# Patient Record
Sex: Female | Born: 1975 | Hispanic: Yes | Marital: Single | Smoking: Current some day smoker
Health system: Southern US, Community
[De-identification: ages and names within clinical notes are randomized; demographics above are authoritative.]

## PROBLEM LIST (undated history)

## (undated) DIAGNOSIS — F209 Schizophrenia, unspecified: Secondary | ICD-10-CM

## (undated) DIAGNOSIS — I10 Essential (primary) hypertension: Secondary | ICD-10-CM

## (undated) DIAGNOSIS — F319 Bipolar disorder, unspecified: Secondary | ICD-10-CM

## (undated) DIAGNOSIS — G43909 Migraine, unspecified, not intractable, without status migrainosus: Secondary | ICD-10-CM

## (undated) HISTORY — PX: OTHER SURGICAL HISTORY: SHX169

---

## 2016-01-23 ENCOUNTER — Emergency Department (HOSPITAL_COMMUNITY)
Admission: EM | Admit: 2016-01-23 | Discharge: 2016-01-24 | Disposition: A | Discharge status: Home / Self Care | Payer: Medicaid Other | Attending: Emergency Medicine | Admitting: Emergency Medicine

## 2016-01-23 ENCOUNTER — Encounter (HOSPITAL_COMMUNITY): Payer: Self-pay | Admitting: Oncology

## 2016-01-23 DIAGNOSIS — F209 Schizophrenia, unspecified: Secondary | ICD-10-CM | POA: Insufficient documentation

## 2016-01-23 DIAGNOSIS — Z79899 Other long term (current) drug therapy: Secondary | ICD-10-CM | POA: Insufficient documentation

## 2016-01-23 DIAGNOSIS — F202 Catatonic schizophrenia: Principal | ICD-10-CM | POA: Diagnosis present

## 2016-01-23 LAB — COMPREHENSIVE METABOLIC PANEL
ALBUMIN: 4.3 g/dL (ref 3.5–5.0)
ALK PHOS: 71 U/L (ref 38–126)
ALT: 12 U/L — AB (ref 14–54)
AST: 18 U/L (ref 15–41)
Anion gap: 10 (ref 5–15)
BUN: 17 mg/dL (ref 6–20)
CALCIUM: 9.8 mg/dL (ref 8.9–10.3)
CHLORIDE: 102 mmol/L (ref 101–111)
CO2: 24 mmol/L (ref 22–32)
CREATININE: 0.68 mg/dL (ref 0.44–1.00)
GFR calc non Af Amer: >60 mL/min (ref >60)
GLUCOSE: 107 mg/dL — AB (ref 65–99)
Potassium: 3.7 mmol/L (ref 3.5–5.1)
SODIUM: 136 mmol/L (ref 135–145)
Total Bilirubin: 0.6 mg/dL (ref 0.3–1.2)
Total Protein: 8.1 g/dL (ref 6.5–8.1)

## 2016-01-23 LAB — CBC
HEMATOCRIT: 41.7 % (ref 36.0–46.0)
HEMOGLOBIN: 14.7 g/dL (ref 12.0–15.0)
MCH: 32.2 pg (ref 26.0–34.0)
MCHC: 35.3 g/dL (ref 30.0–36.0)
MCV: 91.4 fL (ref 78.0–100.0)
Platelets: 386 10*3/uL (ref 150–400)
RBC: 4.56 MIL/uL (ref 3.87–5.11)
RDW: 13.2 % (ref 11.5–15.5)
WBC: 11.8 10*3/uL — ABNORMAL HIGH (ref 4.0–10.5)

## 2016-01-23 LAB — SALICYLATE LEVEL: Salicylate Lvl: <4 mg/dL (ref 2.8–30.0)

## 2016-01-23 LAB — ETHANOL: Alcohol, Ethyl (B): <5 mg/dL (ref <5)

## 2016-01-23 LAB — ACETAMINOPHEN LEVEL: Acetaminophen (Tylenol), Serum: <10 ug/mL — ABNORMAL LOW (ref 10–30)

## 2016-01-23 NOTE — ED Provider Notes (Signed)
[facility] DEPT Provider Note   CSN: [id number] Arrival date & time: 01/23/16  2218  By signing my name below, I, Isabella [name], attest that this documentation has been prepared under the direction and in the presence of [employer], PA-C.  Electronically Signed: Reola [name], ED Scribe. 01/24/16. 12:05 AM.  History   Chief Complaint Chief Complaint  Patient presents with  . Medical Clearance   The history is provided by the patient and a parent. A language interpreter was used ([place]).   HPI Comments: Isabella [name] is a 40 y.o. female who presents to the Emergency Department for medical clearance. Pt was sent from [facility] to have her medically cleared prior to admission. Per mother, over the past two weeks the pt has not been drinking fluids or eating foods leading to her to bring her into [facility] for evaluation. She additionally notes that the pt has been sleeping more throughout the day. Pt has a hx of similar episodes in the past. Pt denies any pain, SI/HI, or hallucinations of any type.    History reviewed. No pertinent past medical history.  There are no active problems to display for this patient.  History reviewed. No pertinent surgical history.  OB History    No data available     Home Medications    Prior to Admission medications   Medication Sig Start Date End Date Taking? Authorizing Provider  benztropine (COGENTIN) 0.5 MG tablet Take 0.5-1 mg by mouth 2 (two) times daily. 0.5 mg in the morning & 1 mg at bedtime   Yes Historical Provider, MD  haloperidol (HALDOL) 5 MG tablet Take 5 mg by mouth 2 (two) times daily.    Yes Historical Provider, MD  lisinopril (PRINIVIL,ZESTRIL) 5 MG tablet Take 5 mg by mouth daily.   Yes Historical Provider, MD   Family History No family history on file.  Social History Social History  Substance Use Topics  . Smoking status: Never Smoker  . Smokeless tobacco: Never Used  . Alcohol use No   Allergies   Review of  patient's allergies indicates no known allergies.  Review of Systems Review of Systems  Psychiatric/Behavioral: Positive for behavioral problems. Negative for hallucinations and suicidal ideas.  A complete 10 system review of systems was obtained and all systems are negative except as noted in the HPI and PMH.    Physical Exam Updated Vital Signs BP 98/79 (BP Location: Left Arm)   Pulse 88   Temp 97.5 F (36.4 C) (Oral)   Resp 18   LMP 01/03/2016 (Approximate)   SpO2 98%   Physical Exam  Constitutional: She is oriented to person, place, and time. She appears well-developed and well-nourished. No distress.  HENT:  Head: Normocephalic and atraumatic.  Eyes: Conjunctivae and EOM are normal. No scleral icterus.  Neck: Normal range of motion.  Pulmonary/Chest: Effort normal. No respiratory distress.  Musculoskeletal: Normal range of motion.  Neurological: She is alert and oriented to person, place, and time.  Skin: Skin is warm and dry. No rash noted. She is not diaphoretic. No erythema. No pallor.  Psychiatric: She is slowed and withdrawn. She exhibits a depressed mood. She expresses no homicidal and no suicidal ideation.  Flat affect  Nursing note and vitals reviewed.   ED Treatments / Results  DIAGNOSTIC STUDIES: Oxygen Saturation is 100% on RA, normal by my interpretation.   COORDINATION OF CARE: 12:05 AM-Discussed next steps with pt. Pt verbalized understanding and is agreeable with the plan.  Labs (all labs ordered are listed, but only abnormal results are displayed) Labs Reviewed  COMPREHENSIVE METABOLIC PANEL - Abnormal; Notable for the following:       Result Value   Glucose, Bld 107 (*)    ALT 12 (*)    All other components within normal limits  ACETAMINOPHEN LEVEL - Abnormal; Notable for the following:    Acetaminophen (Tylenol), Serum <10 (*)    All other components within normal limits  CBC - Abnormal; Notable for the following:    WBC 11.8 (*)    All  other components within normal limits  ETHANOL  SALICYLATE LEVEL  URINE RAPID DRUG SCREEN, HOSP PERFORMED  POC URINE PREG, ED    EKG  EKG Interpretation None      Radiology No results found.  Procedures Procedures (including critical care time)  Medications Ordered in ED Medications - No data to display  Initial Impression / Assessment and Plan / ED Course  I have reviewed the triage vital signs and the nursing notes.  Pertinent labs & imaging results that were available during my care of the patient were reviewed by me and considered in my medical decision making (see chart for details).  Clinical Course    Patient medically cleared. Accepted for care at [facility]. Verbal report of negative urine pregnancy from Mini lab. Patient transferred in stable condition.   Final Clinical Impressions(s) / ED Diagnoses   Final diagnoses:  Schizophrenia, unspecified type [facility])    New Prescriptions Discharge Medication List as of 01/24/2016  1:39 AM      I personally performed the services described in this documentation, which was scribed in my presence. The recorded information has been reviewed and is accurate.       Antonietta Breach, PA-C 01/24/16 0214    Orpah Greek, MD 01/24/16 [phone]

## 2016-01-23 NOTE — BH Assessment (Addendum)
Tele Assessment Note    Isabella [name] is an 40 y.o. female presenting voluntarily for assessment. Pt is accompanied by mother. [employer] video interpreter was utilized during interview to obtain collateral information from mother. Pt was referred to [facility] by family members due to concerns regarding increased vegetative symptoms.   Per intake paperwork completed by family members pt is  "unable to get out of bed, daily function is n ot meeting any normal expectations even after the medications. Not eating, low energy, always sleeping, issues with showering".  Onset of concerning symptoms reported to be 13 days ago. Mom and Pt report no physical limitations in regards to performing activities of daily living with the exception of jaw pain and weakness in hands. Mom states pt experiences jaw pain which limits her ability to open her mouth for eating.  Pt was observed by clinician utilizing hands during assessment to drink a beverage.   Pt is followed by [facility] for medication management only. Mom reports pt compliance with medication however, is concerned with medication effectiveness. [facility] has been made aware of medication concerns and recently updated pt's medications. Mom reports continued concern with medication ineffectiveness.  Pt denies self-injurious behaviors, suicidal ideation, homicidal ideation and hallucinations.  Pt denies history of suicide attempt. Pt has history of schizophrenia and depression. Mom reports pt history of one eight month inpatient admission at [facility] for depression. Mom was unable to recall dates of stay.   Diagnosis: F20.9 Schizophrenia  Past Medical History: No past medical history on file.  No past surgical history on file.  Family History: No family history on file.  Social History:  has no tobacco, alcohol, and drug history on file.  Additional Social History:  Alcohol / Drug Use Pain Medications: No abuse reported. Prescriptions: No abuse reported. Mom  reports compliance with all medications. Over the Counter: No abuse reported. History of alcohol / drug use?: No history of alcohol / drug abuse  CIWA:   COWS:    PATIENT STRENGTHS: (choose at least two) Average or above average intelligence Supportive family/friends  Allergies: Allergies not on file  Home Medications:  (Not in a hospital admission)  OB/GYN Status:  No LMP recorded.  General Assessment Data Location of Assessment: [facility] Assessment Services TTS Assessment: In system Is this a Tele or Face-to-Face Assessment?: Face-to-Face Is this an Initial Assessment or a Re-assessment for this encounter?: Initial Assessment Marital status:  (Not Reported) Is patient pregnant?: Unknown Pregnancy Status: Unknown Living Arrangements: Parent (mother and father) Can pt return to current living arrangement?: Yes Admission Status: Voluntary Is patient capable of signing voluntary admission?: Yes Referral Source: Self/Family/Friend Insurance type: Self-Pay  Medical Screening Exam ([place]) Medical Exam completed: No Reason for MSE not completed: Other: (transferred to [facility] for med. clearance per PA recommendation)  Crisis Care Plan Living Arrangements: Parent (mother and father) Name of Psychiatrist: [facility] Name of Therapist: None  Education Status Is patient currently in school?: No Highest grade of school patient has completed: 11th  Risk to self with the past 6 months Suicidal Ideation: No Has patient been a risk to self within the past 6 months prior to admission? : No Suicidal Intent: No Has patient had any suicidal intent within the past 6 months prior to admission? : No Is patient at risk for suicide?: No Suicidal Plan?: No Has patient had any suicidal plan within the past 6 months prior to admission? : No Access to Means: No What has been your use of  drugs/alcohol within the last 12 months?: Pt denies use of drugs/alcohol Previous Attempts/Gestures:  No Other Self Harm Risks: Vegetative Symptoms Intentional Self Injurious Behavior: None Family Suicide History: No Recent stressful life event(s):  (None identified) Persecutory voices/beliefs?: No (Pt denies) Depression: Yes Depression Symptoms: Isolating, Loss of interest in usual pleasures Substance abuse history and/or treatment for substance abuse?: No Suicide prevention information given to non-admitted patients: Not applicable  Risk to Others within the past 6 months Homicidal Ideation: No Does patient have any lifetime risk of violence toward others beyond the six months prior to admission? : No Thoughts of Harm to Others: No Current Homicidal Intent: No Current Homicidal Plan: No Access to Homicidal Means: No History of harm to others?: No Assessment of Violence: None Noted Does patient have access to weapons?: No Criminal Charges Pending?: No Does patient have a court date: No Is patient on probation?: No  Psychosis Hallucinations: None noted (Pt denies) Delusions: None noted  Mental Status Report Appearance/Hygiene: Unremarkable Eye Contact: Fair Motor Activity: Psychomotor retardation Speech: Slow, Logical/coherent Level of Consciousness: Alert, Quiet/awake Mood: Anhedonia Affect: Flat Anxiety Level: None Thought Processes: Relevant, Coherent Judgement: Partial Orientation: Person, Time, Place, Situation Obsessive Compulsive Thoughts/Behaviors: Unable to Assess  Cognitive Functioning Concentration: Decreased Memory: Recent Intact, Remote Intact IQ: Average Insight: Poor Impulse Control: Fair Appetite: Poor Weight Loss: 20 (within 84mths) Weight Gain: 0 Sleep: Increased Total Hours of Sleep: 17 Vegetative Symptoms: Staying in bed, Not bathing, Decreased grooming  ADLScreening [facility] Assessment Services) Patient's cognitive ability adequate to safely complete daily activities?: No Patient able to express need for assistance with ADLs?:  Yes Independently performs ADLs?: Yes (appropriate for developmental age) (Pt is physically able to perform ADLs without assistance)  Prior Inpatient Therapy Prior Inpatient Therapy: Yes Prior Therapy Dates: Mom reports 81mth stay, dates of stay unknown. Mom reports stay was " a few months ago" Prior Therapy Facilty/Provider(s): [facility] Reason for Treatment: depression per mom  Prior Outpatient Therapy Prior Outpatient Therapy: Yes Prior Therapy Dates: ongoing Prior Therapy Facilty/Provider(s): [facility] Reason for Treatment: Medication Management only Does patient have an ACCT team?: No Does patient have Intensive In-House Services?  : No Does patient have [facility] services? : No Does patient have P4CC services?: No  ADL Screening (condition at time of admission) Patient's cognitive ability adequate to safely complete daily activities?: No Is the patient deaf or have difficulty hearing?: No Does the patient have difficulty seeing, even when wearing glasses/contacts?: No Does the patient have difficulty concentrating, remembering, or making decisions?: Yes (Per mother, pt denies) Patient able to express need for assistance with ADLs?: Yes Does the patient have difficulty dressing or bathing?: No Independently performs ADLs?: Yes (appropriate for developmental age) (Pt is physically able to perform ADLs without assistance) Does the patient have difficulty walking or climbing stairs?: No Weakness of Legs: None Weakness of Arms/Hands: Both  Home Assistive Devices/Equipment Home Assistive Devices/Equipment: None  Therapy Consults (therapy consults require a physician order) PT Evaluation Needed: No OT Evalulation Needed: No SLP Evaluation Needed: No Abuse/Neglect Assessment (Assessment to be complete while patient is alone) Physical Abuse: Denies Verbal Abuse: Denies Sexual Abuse: Denies Exploitation of patient/patient's resources: Denies Self-Neglect: Denies Values /  Beliefs Cultural Requests During Hospitalization: None Spiritual Requests During Hospitalization: None Consults Spiritual Care Consult Needed: No Social Work Consult Needed: No Regulatory affairs officer (For Healthcare) Does patient have an advance directive?: No Would patient like information on creating an advanced directive?: No - patient declined information  Additional Information 1:1 In Past 12 Months?: No CIRT Risk: No Elopement Risk: No Does patient have medical clearance?: No     Disposition: Clinician consulted with Darlyne Russian, PA and pt is recommended for inpatient admission. Pt has been assigned to 507 bed 1 by Shana Chute. Pt is being transferred to [facility] for medical clearance per PA's directives. Lattie Haw, Agricultural consultant has been notified of pt disposition.  Disposition Initial Assessment Completed for this Encounter: Yes Disposition of Patient: Inpatient treatment program Type of inpatient treatment program: Adult  Juanpablo Ciresi J Martin[place] 01/23/2016 10:00 PM

## 2016-01-23 NOTE — H&P (Signed)
Behavioral Health Medical Screening Exam  Isabella [name] is an 40 y.o. female.  Total Time spent with patient: 15 minutes  Psychiatric Specialty Exam: Physical Exam  Constitutional:  Reported wgt loss 20 lbs last 4 months per mother  HENT:  Head: Normocephalic and atraumatic.  Eyes: Conjunctivae and EOM are normal. Pupils are equal, round, and reactive to light. Right eye exhibits no discharge. Left eye exhibits no discharge. No scleral icterus.  Neck: No JVD present. No tracheal deviation present. No thyromegaly present.  Cardiovascular: Normal rate and regular rhythm.   Respiratory: No stridor. No respiratory distress. She has no wheezes.  GI:  deferred  Genitourinary:  Genitourinary Comments: deferred  Musculoskeletal:  Catatonic like posture/marked psychomotor retardation  Lymphadenopathy:    She has no cervical adenopathy.  Neurological: She is alert. No cranial nerve deficit. She exhibits abnormal muscle tone.  Skin: Skin is warm and dry.  Psychiatric:  Behavior-marked psychomotor retardation.Unable to attend to ADL per mother Orientation x 3 Affect-very flat Perception-lacks insight Thought- delusions;denies HI SI    Review of Systems  Constitutional: Positive for malaise/fatigue and weight loss. Negative for chills, diaphoresis and fever.  HENT: Negative for congestion, ear discharge, ear pain, hearing loss, nosebleeds, sore throat and tinnitus.   Eyes: Negative for blurred vision, double vision, photophobia, pain, discharge and redness.  Respiratory: Negative for cough, hemoptysis, sputum production, shortness of breath, wheezing and stridor.   Cardiovascular: Negative for chest pain, palpitations, orthopnea, claudication, leg swelling and PND.  Gastrointestinal: Negative for abdominal pain, blood in stool, constipation, diarrhea, heartburn, melena, nausea and vomiting.  Genitourinary: Negative for dysuria, flank pain, frequency, hematuria and urgency.   Musculoskeletal: Negative for back pain, falls, joint pain, myalgias and neck pain.  Skin: Negative for itching and rash.  Neurological: Negative for dizziness, tingling, tremors, sensory change, speech change, focal weakness, seizures, loss of consciousness, weakness and headaches.  Endo/Heme/Allergies: Negative for environmental allergies and polydipsia. Does not bruise/bleed easily.  Psychiatric/Behavioral: Positive for depression. Negative for hallucinations, substance abuse and suicidal ideas. The patient is nervous/anxious and has insomnia.        Motgher reports pt SPENT * mos as inpt at [facility] this year.UNABLE TO ACCESS THRU CARE EVERYWHERE USING OUR DEMOGRAPHICS    There were no vitals taken for this visit.There is no height or weight on file to calculate BMI.  General Appearance: Fairly Groomed Mask like facies  Eye Contact:  Good  Speech:  Slow  Volume:  Decreased  Mood:  Anhedonic  Affect:  Congruent  Thought Process:  Disorganized and Descriptions of Associations: Loose  Orientation:  Full (Time, Place, and Person)  Thought Content:  Delusions and Rumination  Suicidal Thoughts:  No  Homicidal Thoughts:  No  Memory:  Negative  Judgement:  Impaired  Insight:  Lacking  Psychomotor Activity:  Psychomotor Retardation  Concentration: Concentration: limited and Attention Span: Limited  Recall:  [place] of Knowledge:Fair  Language: Fair  Akathisia:  Negative  Handed:  Right  AIMS (if indicated):     Assets:  Social Support  Sleep:   Disturbed    Musculoskeletal: Strength & Muscle Tone: Catatonic like/cannot rule out reaction to Haldol Gait & Station: Slow/retarded Patient leans: N/A  There were no vitals taken for this visit.  Recommendations:  Based on my evaluation the patient appears to have an emergency medical condition for which I recommend the patient be transferred to the emergency department for further evaluation.  Darlyne Russian, PA-C 01/23/2016, 10:06  PM

## 2016-01-23 NOTE — ED Triage Notes (Signed)
Per Idelle Jo, [facility] called and stated pt was coming here for medical clearance.  When talking to the pt she cannot verbalized why she is here.  Pt will be speaking then stare off into space.  Pt denies SI/HI/AVH.

## 2016-01-23 NOTE — ED Notes (Signed)
Bed: [facility]4 Expected date:  Expected time:  Means of arrival:  Comments: 

## 2016-01-24 ENCOUNTER — Other Ambulatory Visit (HOSPITAL_COMMUNITY): Payer: Self-pay | Admitting: Medical

## 2016-01-24 ENCOUNTER — Inpatient Hospital Stay (HOSPITAL_COMMUNITY)
Admission: AD | Admit: 2016-01-24 | Discharge: 2016-02-08 | DRG: 885 | Disposition: A | Discharge status: Home / Self Care | Payer: Medicaid Other | Attending: Psychiatry | Admitting: Psychiatry

## 2016-01-24 ENCOUNTER — Encounter (HOSPITAL_COMMUNITY): Payer: Self-pay | Admitting: Medical

## 2016-01-24 DIAGNOSIS — R42 Dizziness and giddiness: Secondary | ICD-10-CM | POA: Diagnosis not present

## 2016-01-24 DIAGNOSIS — F202 Catatonic schizophrenia: Secondary | ICD-10-CM | POA: Diagnosis present

## 2016-01-24 LAB — RAPID URINE DRUG SCREEN, HOSP PERFORMED
AMPHETAMINES: NOT DETECTED
Barbiturates: NOT DETECTED
Benzodiazepines: NOT DETECTED
COCAINE: NOT DETECTED
OPIATES: NOT DETECTED
TETRAHYDROCANNABINOL: NOT DETECTED

## 2016-01-24 MED ORDER — ENSURE ENLIVE PO LIQD
237.0000 mL | Freq: Two times a day (BID) | ORAL | Status: DC
  Administered 2016-01-24 – 2016-02-08 (×22): 237 mL via ORAL

## 2016-01-24 MED ORDER — LISINOPRIL 5 MG PO TABS
5.0000 mg | ORAL_TABLET | Freq: Every day | ORAL | Status: DC
  Administered 2016-01-24 – 2016-01-25 (×2): 5 mg via ORAL
  Filled 2016-01-24 (×4): qty 1

## 2016-01-24 MED ORDER — MAGNESIUM HYDROXIDE 400 MG/5ML PO SUSP: 30.0000 mL | Freq: Every day | ORAL | Status: DC | PRN

## 2016-01-24 MED ORDER — BENZTROPINE MESYLATE 0.5 MG PO TABS
0.5000 mg | ORAL_TABLET | Freq: Every day | ORAL | Status: DC
  Administered 2016-01-24 – 2016-01-25 (×2): 0.5 mg via ORAL
  Filled 2016-01-24 (×3): qty 1

## 2016-01-24 MED ORDER — ALUM & MAG HYDROXIDE-SIMETH 200-200-20 MG/5ML PO SUSP: 30.0000 mL | ORAL | Status: DC | PRN

## 2016-01-24 MED ORDER — ACETAMINOPHEN 325 MG PO TABS
650.0000 mg | ORAL_TABLET | Freq: Four times a day (QID) | ORAL | Status: DC | PRN
  Administered 2016-01-27: 650 mg via ORAL
  Filled 2016-01-24: qty 2

## 2016-01-24 MED ORDER — HYDROXYZINE HCL 25 MG PO TABS
25.0000 mg | ORAL_TABLET | ORAL | Status: DC | PRN
  Administered 2016-01-28: 25 mg via ORAL
  Filled 2016-01-24: qty 1
  Filled 2016-01-24: qty 10

## 2016-01-24 MED ORDER — BENZTROPINE MESYLATE 1 MG PO TABS
1.0000 mg | ORAL_TABLET | Freq: Every day | ORAL | Status: DC
  Administered 2016-01-24 – 2016-01-25 (×2): 1 mg via ORAL
  Filled 2016-01-24 (×3): qty 1

## 2016-01-24 NOTE — Progress Notes (Signed)
Patient arrived to unit voluntarily after being brought in by her mother for evaluation. When asked by staff, patient unable to give a reason for admission, but instead only repeated the same phrase "My mother brought me here. She said I am sick". Pt unable to think of a reason why her mother may have thought this. Pt denies suicidal ideations, homicidal ideations, hallucinations, or depression. Pt notably disheveled with hair oily and unwashed and dirt under her fingernails. Pt with vacant gaze and delayed responses and thought blocking. Pt also with psychomotor retardation and is slow in movements and in her ability to follow directions. Pt unable to answer the majority of assessment questions. Pt did state "I'm ready to go to sleep now" and did not answer any further questions. No s/s of distress noted on assessment.

## 2016-01-24 NOTE — Progress Notes (Signed)
Isabella [name] has been in her bed..influenza her room most of the afternoon. She does not speak with this Probation officer. A She refused to complete her daily assessment and shook her head " no" when this Probation officer asked her to answer the questions and Probation officer would transcribe the answers fior her. A She is isolative and guarded. R Safety in place.

## 2016-01-24 NOTE — H&P (Signed)
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 female presenting voluntarily for assessment. Pt is accompanied by mother. [employer] video interpreter was utilized during interview to obtain collateral information from mother. Pt was referred to [facility] by family members due to concerns regarding increased vegetative symptoms. Per intake paperwork completed by family members pt is  "unable to get out of bed, daily function is n ot meeting any normal expectations even after the medications. Not eating, low energy, always sleeping, issues with showering".  Onset of concerning symptoms reported to be 13 days ago. Mom and Pt report no physical limitations in regards to performing activities of daily living with the exception of jaw pain and weakness in hands. Mom states pt experiences jaw pain which limits her ability to open her mouth for eating.  Pt was observed by clinician utilizing hands during assessment to drink a beverage.  Pt is followed by [facility] for medication management only. Mom reports pt compliance with medication however, is concerned with medication effectiveness. [facility] has been made aware of medication concerns and recently updated pt's medications. Mom reports continued concern with medication ineffectiveness. Pt denies self-injurious behaviors, suicidal ideation, homicidal ideation and hallucinations.  Pt denies history of suicide attempt. Pt has history of schizophrenia and depression. Mom reports pt history of one eight month inpatient admission at [facility] for depression. Mom was unable to recall dates of stay.    On evaluationReyna [name] is awake and alert appears to be catatonic. Seen resting in bedroom.  Denies suicidal or homicidal ideation. Denies auditory or visual hallucination. Patient appears to be thought blocking at times. Patient reports she is medications complaint and is unsure why she is in patient at this time. Patient denies depression or depressive symptoms.Patient reports resting well.  Support, encouragement and reassurance was provided.   Associated Signs/Symptoms: Depression Symptoms:  psychomotor retardation, loss of energy/fatigue, (Hypo) Manic Symptoms:  Distractibility, Anxiety Symptoms:  Excessive Worry, Psychotic Symptoms:  Hallucinations: None PTSD Symptoms: Avoidance:  None Total Time spent with patient: 30 minutes  Past Psychiatric History: see above  Is the patient at risk to self? No.  Has the patient been a risk to self in the past 6 months? No.  Has the patient been a risk to self within the distant past? No.  Is the patient a risk to others? No.  Has the patient been a risk to others in the past 6 months? No.  Has the patient been a risk to others within the distant past? No.   Prior Inpatient Therapy: Prior Inpatient Therapy: Yes Prior Therapy Dates: Mom reports [facility] stay, dates of stay unknown. Mom reports stay was " a few months ago" [facility] Prior Outpatient Therapy: Prior Outpatient Therapy: Yes Prior Therapy Dates: ongoing Prior Therapy Facilty/Provider(s): [facility] Reason for Treatment: Medication Management only Does patient have an ACCT team?: No Does patient have Intensive In-House Services?  : No Does patient have [facility] services? : No Does patient have P4CC services?: No  Alcohol Screening: 1. How often do you have a drink containing alcohol?: Never 9. Have you or someone else been injured as a result of  your drinking?: No 10. Has a relative or friend or a  doctor or another health worker been concerned about your drinking or suggested you cut down?: No Alcohol Use Disorder Identification Test Final Score (AUDIT): 0 Brief Intervention: AUDIT score less than 7 or less-screening does not suggest unhealthy drinking-brief intervention not indicated Substance Abuse History in the last 12 months:  No. Consequences of Substance Abuse: NA Previous Psychotropic Medications: YES Psychological Evaluations: YES Past Medical History: History reviewed. No pertinent past medical history. History reviewed. No pertinent surgical history. Family History: History reviewed. No pertinent family history. Family Psychiatric  History:  Tobacco Screening: Have you used any form of tobacco in the last 30 days? (Cigarettes, Smokeless Tobacco, Cigars, and/or Pipes): No Social History:  History  Alcohol Use No     History  Drug Use No    Additional Social History: Marital status:  (Not Reported)    Pain Medications: No abuse reported. Prescriptions: No abuse reported. Mom reports compliance with all medications. Over the Counter: No abuse reported. History of alcohol / drug use?: No history of alcohol / drug abuse                    Allergies:  No Known Allergies Lab Results:  Results for orders placed or performed during the hospital encounter of 01/23/16 (from the past 48 hour(s))  Rapid urine drug screen (hospital performed)     Status: None   Collection Time: 01/23/16 12:37 AM  Result Value Ref Range   Opiates NONE DETECTED NONE DETECTED   Cocaine NONE DETECTED NONE DETECTED   Benzodiazepines NONE DETECTED NONE DETECTED   Amphetamines NONE DETECTED NONE DETECTED   Tetrahydrocannabinol NONE DETECTED NONE DETECTED   Barbiturates NONE DETECTED NONE DETECTED    Comment:        DRUG SCREEN FOR MEDICAL PURPOSES ONLY.  IF CONFIRMATION IS NEEDED FOR ANY PURPOSE, NOTIFY LAB WITHIN 5 DAYS.        LOWEST DETECTABLE LIMITS FOR URINE DRUG SCREEN Drug  Class       Cutoff (ng/mL) Amphetamine      1000 Barbiturate      200 Benzodiazepine   568 Tricyclics       127 Opiates          300 Cocaine          300 THC              50   Comprehensive metabolic panel     Status: Abnormal   Collection Time: 01/23/16 11:08 PM  Result Value Ref Range   Sodium 136 135 - 145 mmol/L   Potassium 3.7 3.5 - 5.1 mmol/L   Chloride 102 101 - 111 mmol/L   CO2 24 22 - 32 mmol/L   Glucose, Bld 107 (H) 65 - 99 mg/dL   BUN 17 6 - 20 mg/dL   Creatinine, Ser 0.68 0.44 - 1.00 mg/dL   Calcium 9.8 8.9 - 10.3 mg/dL   Total Protein 8.1 6.5 - 8.1 g/dL   Albumin 4.3 3.5 - 5.0 g/dL   AST 18 15 - 41 U/L   ALT 12 (L) 14 - 54 U/L   Alkaline Phosphatase 71 38 - 126 U/L   Total Bilirubin 0.6 0.3 - 1.2 mg/dL   GFR calc non Af Amer >60 >60 mL/min   GFR calc Af Amer >60 >60 mL/min    Comment: (NOTE) The eGFR has been calculated using the CKD EPI equation. This calculation has not  been validated in all clinical situations. eGFR's persistently <60 mL/min signify possible Chronic Kidney Disease.    Anion gap 10 5 - 15  Ethanol     Status: None   Collection Time: 01/23/16 11:08 PM  Result Value Ref Range   Alcohol, Ethyl (B) <5 <5 mg/dL    Comment:        LOWEST DETECTABLE LIMIT FOR SERUM ALCOHOL IS 5 mg/dL FOR MEDICAL PURPOSES ONLY   Salicylate level     Status: None   Collection Time: 01/23/16 11:08 PM  Result Value Ref Range   Salicylate Lvl <8.3 2.8 - 30.0 mg/dL  Acetaminophen level     Status: Abnormal   Collection Time: 01/23/16 11:08 PM  Result Value Ref Range   Acetaminophen (Tylenol), Serum <10 (L) 10 - 30 ug/mL    Comment:        THERAPEUTIC CONCENTRATIONS VARY SIGNIFICANTLY. A RANGE OF 10-30 ug/mL MAY BE AN EFFECTIVE CONCENTRATION FOR MANY PATIENTS. HOWEVER, SOME ARE BEST TREATED AT CONCENTRATIONS OUTSIDE THIS RANGE. ACETAMINOPHEN CONCENTRATIONS >150 ug/mL AT 4 HOURS AFTER INGESTION AND >50 ug/mL AT 12 HOURS AFTER INGESTION ARE OFTEN  ASSOCIATED WITH TOXIC REACTIONS.   cbc     Status: Abnormal   Collection Time: 01/23/16 11:08 PM  Result Value Ref Range   WBC 11.8 (H) 4.0 - 10.5 K/uL   RBC 4.56 3.87 - 5.11 MIL/uL   Hemoglobin 14.7 12.0 - 15.0 g/dL   HCT 41.7 36.0 - 46.0 %   MCV 91.4 78.0 - 100.0 fL   MCH 32.2 26.0 - 34.0 pg   MCHC 35.3 30.0 - 36.0 g/dL   RDW 13.2 11.5 - 15.5 %   Platelets 386 150 - 400 K/uL    Blood Alcohol level:  Lab Results  Component Value Date   ETH <5 41/96/2229    Metabolic Disorder Labs:  No results found for: HGBA1C, MPG No results found for: PROLACTIN No results found for: CHOL, TRIG, HDL, CHOLHDL, VLDL, LDLCALC  Current Medications: Current Facility-Administered Medications  Medication Dose Route Frequency Provider Last Rate Last Dose  . acetaminophen (TYLENOL) tablet 650 mg  650 mg Oral Q6H PRN Dara Hoyer, PA-C      . alum & mag hydroxide-simeth (MAALOX/MYLANTA) 200-200-20 MG/5ML suspension 30 mL  30 mL Oral Q4H PRN Dara Hoyer, PA-C      . benztropine (COGENTIN) tablet 0.5 mg  0.5 mg Oral Daily Dara Hoyer, PA-C   0.5 mg at 01/24/16 0841  . benztropine (COGENTIN) tablet 1 mg  1 mg Oral QHS Dara Hoyer, PA-C      . feeding supplement (ENSURE ENLIVE) (ENSURE ENLIVE) liquid 237 mL  237 mL Oral BID BM Dara Hoyer, PA-C      . hydrOXYzine (ATARAX/VISTARIL) tablet 25 mg  25 mg Oral Q4H PRN Dara Hoyer, PA-C      . lisinopril (PRINIVIL,ZESTRIL) tablet 5 mg  5 mg Oral Daily Dara Hoyer, PA-C   5 mg at 01/24/16 0841  . magnesium hydroxide (MILK OF MAGNESIA) suspension 30 mL  30 mL Oral Daily PRN Dara Hoyer, PA-C       PTA Medications: Prescriptions Prior to Admission  Medication Sig Dispense Refill Last Dose  . benztropine (COGENTIN) 0.5 MG tablet Take 0.5-1 mg by mouth 2 (two) times daily. 0.5 mg in the morning & 1 mg at bedtime   01/23/2016 at Unknown time  . haloperidol (HALDOL) 5 MG tablet Take 5 mg by mouth 2 (  two) times daily.    01/23/2016 at  Unknown time  . lisinopril (PRINIVIL,ZESTRIL) 5 MG tablet Take 5 mg by mouth daily.   01/23/2016 at Unknown time    Musculoskeletal: Strength & Muscle Tone: within normal limits Gait & Station: normal Patient leans: N/A  Psychiatric Specialty Exam: Physical Exam  Nursing note and vitals reviewed. Constitutional: She is oriented to person, place, and time. She appears well-developed.  Cardiovascular: Normal rate.   Neurological: She is alert and oriented to person, place, and time.  Psychiatric: She has a normal mood and affect. Her behavior is normal.    Review of Systems  Psychiatric/Behavioral: Positive for depression and hallucinations.    Blood pressure 93/68, pulse 84, temperature 97.9 F (36.6 C), temperature source Oral, resp. rate 18, height 5' 4"  (1.626 m), weight 65.3 kg (144 lb), last menstrual period 01/03/2016.Body mass index is 24.72 kg/m.  General Appearance: Bizarre and Disheveled  Eye Contact:  Minimal  Speech:  Slow  Volume:  Normal  Mood:  Dysphoric  Affect:  Flat and Restricted  Thought Process:  Coherent and NA  Orientation:  Full (Time, Place, and Person)  Thought Content:  Hallucinations: Auditory  Suicidal Thoughts:  No  Homicidal Thoughts:  No  Memory:  Immediate;   Poor Remote;   Poor  Judgement:  Poor  Insight:  Lacking  Psychomotor Activity:  Decreased, Psychomotor Retardation and Restlessness  Concentration:  Concentration: Poor  Recall:  [place] of Knowledge:  Fair  Language:  Fair  Akathisia:  No  Handed:  Right  AIMS (if indicated):     Assets:  Desire for Improvement Resilience Social Support  ADL's:  Intact  Cognition:  WNL  Sleep:  Number of Hours: 2.75     I agree with current treatment plan on 01/24/2016, Patient seen face-to-face for psychiatric evaluation follow-up, chart reviewed . Reviewed the information documented and agree with the treatment plan.  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[place]) Long Term Goal(s): Improvement in symptoms so as ready for discharge  Short Term Goals: Ability to identify changes in lifestyle to reduce recurrence of condition will improve, Ability to demonstrate self-control will improve, Ability to maintain clinical measurements within normal limits will improve and Compliance with prescribed medications will improve  Physician Treatment Plan for Secondary Diagnosis: Principal Problem:   Schizophrenia, 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    Derrill Center, NP 9/10/20171:29 PM   Patient seen for this face to face psychiatric evaluation, discussed with physician extender, and formulated treatment plan. Reviewed the information documented and agree with the treatment plan.  Caleah Tortorelli 01/24/2016 4:09 PM

## 2016-01-24 NOTE — BHH Suicide Risk Assessment (Signed)
[facility] Admission Suicide Risk Assessment   Nursing information obtained from:    Demographic factors:    Current Mental Status:    Loss Factors:    Historical Factors:    Risk Reduction Factors:     Total Time spent with patient: 30 minutes Principal Problem: <principal problem not specified> Diagnosis:   Patient Active Problem List   Diagnosis Date Noted  . 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 female Admitted voluntarily for concerns regarding increased vegetative symptoms, Unable to care for herself with a significant distance of sleep and appetite and energy and history of schizophrenia with poor insight and judgment.   Please review the following information for more details: Per intake paperwork completed by family members pt is  "unable to get out of bed, daily function is n ot meeting any normal expectations even after the medications. Not eating, low energy, always sleeping, issues with showering".  Onset of concerning symptoms reported to be 13 days ago. Mom and Pt report no physical limitations in regards to performing activities of daily living with the exception of jaw pain and weakness in hands. Mom states pt experiences jaw pain which limits her ability to open her mouth for eating.  Pt was observed by clinician utilizing hands during assessment to drink a beverage.   Pt is followed by [facility] for medication management only. Mom reports pt compliance with medication however, is concerned with medication effectiveness. [facility] has been made aware of medication concerns and recently updated pt's medications. Mom reports continued concern with medication ineffectiveness.   Continued Clinical Symptoms:  Alcohol Use Disorder Identification Test Final Score (AUDIT): 0 The "Alcohol Use Disorders Identification Test", Guidelines for Use in Primary Care, Second Edition.  World Pharmacologist [facility]). Score between  0-7:  no or low risk or alcohol related problems. Score between 8-15:  moderate risk of alcohol related problems. Score between 16-19:  high risk of alcohol related problems. Score 20 or above:  warrants further diagnostic evaluation for alcohol dependence and treatment.   CLINICAL FACTORS:   Depression:   Delusional Recent sense of peace/wellbeing Schizophrenia:   Depressive state Paranoid or undifferentiated type Currently Psychotic Previous Psychiatric Diagnoses and Treatments   Musculoskeletal: Strength & Muscle Tone: decreased Gait & Station: unable to stand Patient leans: N/A  Psychiatric Specialty Exam: Physical Exam  ROS  Blood pressure 93/68, pulse 84, temperature 97.9 F (36.6 C), temperature source Oral, resp. rate 18, height 5\' 4"  (1.626 m), weight 65.3 kg (144 lb), last menstrual period 01/03/2016.Body mass index is 24.72 kg/m.  General Appearance: Bizarre and Guarded  Eye Contact:  Fair  Speech:  Slow  Volume:  Decreased  Mood:  Depressed  Affect:  Flat  Thought Process:  Coherent and Goal Directed  Orientation:  Full (Time, Place, and Person)  Thought Content:  Delusions and Rumination  Suicidal Thoughts:  No  Homicidal Thoughts:  No  Memory:  Immediate;   Good Recent;   Fair Remote;   Fair  Judgement:  Impaired  Insight:  Lacking  Psychomotor Activity:  Psychomotor Retardation  Concentration:  Concentration: Fair and Attention Span: Poor  Recall:  Poor  Fund of Knowledge:  Fair  Language:  Fair  Akathisia:  Negative  Handed:  Right  AIMS (if indicated):     Assets:  Communication Skills Desire for Improvement Financial Resources/Insurance Housing Leisure Time Resilience Social Support Transportation  ADL's:  Impaired  Cognition:  Impaired,  Mild  Sleep:  Number of Hours: 2.75      COGNITIVE FEATURES THAT CONTRIBUTE TO RISK:  Closed-mindedness, Loss of executive function and Polarized thinking    SUICIDE RISK:   Minimal: No  identifiable suicidal ideation.  Patients presenting with no risk factors but with morbid ruminations; may be classified as minimal risk based on the severity of the depressive symptoms   PLAN OF CARE: Patient admitted for increased symptoms of psychosis motor retardation, isolation, withdrawn unable to care for herself and has a history of  Schizophrenia and depression. Patient meets criteria for hospitalization for crisis stabilization, safety monitoring on medication management of depression and schizophrenia.  I certify that inpatient services furnished can reasonably be expected to improve the patient's condition.  Ambrose Finland, MD 01/24/2016, 11:42 AM

## 2016-01-24 NOTE — Progress Notes (Signed)
Patient has been isolative to room this shift. Patient denies SI, HI and AVH and stated "I feel much better today." Patient was noted to be flushed in appearance, but vital signs have been within normal limit.  Patient has been able to go to meals and was encouraged to complete hygiene.   Assess patient for safety, offer medications as prescribed, engage patient in 1:1 staff talks.   Continue to monitor as prescribed,

## 2016-01-24 NOTE — Plan of Care (Signed)
Problem: Education: Goal: Knowledge of [facility] Education information/materials will improve Outcome: Not Progressing Variance: Slow or unresponsive to therapy Comments: Pt currently unable to process information.

## 2016-01-24 NOTE — BHH Counselor (Signed)
Adult Comprehensive Assessment  Patient ID: Isabella [name], female   DOB: [birth year], 40 y.o.   MRN: [id number]  Information Source: Information source: Patient  Current Stressors:  Employment / Job issues: unemployed, dependent on parents  Living/Environment/Situation:  Living Arrangements: Parent Living conditions (as described by patient or guardian): Pt lives with both parents in [place].  Pt reports this is a good environment.  How long has patient lived in current situation?: 1 year What is atmosphere in current home: Loving, Comfortable, Supportive  Family History:  Marital status: Divorced Divorced, when?: 7-8 years ago What types of issues is patient dealing with in the relationship?: "some problems" Does patient have children?: Yes How many children?: 2 How is patient's relationship with their children?: 102 and 56 year old, don't talk to them too often, they live with their father in [place]  Childhood History:  By whom was/is the patient raised?: Both parents Additional childhood history information: Pt reports having a pretty good childhood.  Description of patient's relationship with caregiver when they were a child: Pt reports getting along well with parents growing up. Patient's description of current relationship with people who raised him/her: Pt reports being close to her parents today.  Did patient suffer any verbal/emotional/physical/sexual abuse as a child?: No Did patient suffer from severe childhood neglect?: No Has patient ever been sexually abused/assaulted/raped as an adolescent or adult?: No Was the patient ever a victim of a crime or a disaster?: No Witnessed domestic violence?: No Has patient been effected by domestic violence as an adult?: No  Education:  Highest grade of school patient has completed: 10th Currently a student?: No Learning disability?: No  Employment/Work Situation:   Employment situation: Unemployed Patient's job has been  impacted by current illness: No What is the longest time patient has a held a job?: 2-3 years  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 Has patient ever been in the [employer]?: No Has patient ever served in combat?: No Did You Receive Any Psychiatric Treatment/Services While in Passenger transport manager?: No Are There Guns or Other Weapons in [place]?: No  Financial Resources:   Museum/gallery curator resources: Support from parents / caregiver Does patient have a Programmer, applications or guardian?: No  Alcohol/Substance Abuse:   What has been your use of drugs/alcohol within the last 12 months?: Pt denies If attempted suicide, did drugs/alcohol play a role in this?: No Alcohol/Substance Abuse Treatment Hx: Denies past history Has alcohol/substance abuse ever caused legal problems?: No  Social Support System:   Patient's Community Support System: Good Describe Community Support System: Pt reports her parents are her main support.  Type of faith/religion: Pt denies How does patient's faith help to cope with current illness?: N/A  Leisure/Recreation:   Leisure and Hobbies: talk with friends  Strengths/Needs:   What things does the patient do well?: talk with family and friends In what areas does patient struggle / problems for patient: don't know  Discharge Plan:   Does patient have access to transportation?: Yes Will patient be returning to same living situation after discharge?: Yes Currently receiving community mental health services: Yes (From Whom) ([facility] in [place]) If no, would patient like referral for services when discharged?: Yes (What county?) [facility]) Does patient have financial barriers related to discharge medications?: No  Summary/Recommendations:   Summary and Recommendations (to be completed by the evaluator): Patient is a 40 year old female, with a diagnosis of Schizophrenia, on admission.  Patient presented to the hospital due  to concerns of patient's  affect and lack of doing daily activities.  Patient was unaware of why she was admitted to the hospital, only stating her mother was concerned, but doesn't know what her concerns were.  Patient will benefit from crisis stabilization, medication evaluation, group therapy and psycho education in addition to case management for discharge planning. At discharge, it is recommended that patient remain compliant with established discharge plan and continued treatment.    Pt presents with blunted affect and depressed mood.  Pt lives in [place] with her parents.  Pt is interested in returning to [facility] for medication management and therapy after discharge.  Discharge Process and Patient Expectations information sheet signed by patient, witnessed by writer and inserted in patient's shadow chart.  Pt is not a smoker so [place] Quitline N/A at discharge.       Magdalen Spatz. 01/24/2016

## 2016-01-24 NOTE — BHH Group Notes (Signed)
[place] Group Notes: (Clinical Social Work)   01/24/2016      Type of Therapy:  Group Therapy   Participation Level:  Did Not Attend despite MHT prompting   Selmer Dominion, LCSW 01/24/2016, 12:18 PM

## 2016-01-24 NOTE — Progress Notes (Signed)
Psychoeducational Group Note  Date:  01/24/2016 Time:  2204  Group Topic/Focus:  Wrap-Up Group:   The focus of this group is to help patients review their daily goal of treatment and discuss progress on daily workbooks.   Participation Level: Did Not Attend  Participation Quality:  Not Applicable  Affect:  Not Applicable  Cognitive:  Not Applicable  Insight:  Not Applicable  Engagement in Group: Not Applicable  Additional Comments: The patient did not attend group this evening since she remained in her bed.    Tongela Encinas S 01/24/2016, 10:04 PM

## 2016-01-25 ENCOUNTER — Encounter (HOSPITAL_COMMUNITY): Payer: Self-pay | Admitting: Psychiatry

## 2016-01-25 MED ORDER — CITALOPRAM HYDROBROMIDE 10 MG PO TABS
10.0000 mg | ORAL_TABLET | Freq: Every day | ORAL | Status: DC
  Administered 2016-01-25 – 2016-01-27 (×3): 10 mg via ORAL
  Filled 2016-01-25 (×7): qty 1

## 2016-01-25 MED ORDER — LORAZEPAM 0.5 MG PO TABS
0.5000 mg | ORAL_TABLET | Freq: Two times a day (BID) | ORAL | Status: DC
  Administered 2016-01-25 – 2016-01-27 (×4): 0.5 mg via ORAL
  Filled 2016-01-25 (×4): qty 1

## 2016-01-25 MED ORDER — TRAZODONE HCL 50 MG PO TABS
50.0000 mg | ORAL_TABLET | Freq: Every evening | ORAL | Status: DC | PRN
  Administered 2016-01-26 – 2016-01-28 (×2): 50 mg via ORAL
  Filled 2016-01-25 (×2): qty 1

## 2016-01-25 MED ORDER — ZIPRASIDONE MESYLATE 20 MG IM SOLR: 20.0000 mg | INTRAMUSCULAR | Status: DC | PRN

## 2016-01-25 MED ORDER — LORAZEPAM 1 MG PO TABS: 1.0000 mg | ORAL_TABLET | ORAL | Status: DC | PRN

## 2016-01-25 MED ORDER — RISPERIDONE 2 MG PO TBDP: 2.0000 mg | ORAL_TABLET | Freq: Three times a day (TID) | ORAL | Status: DC | PRN

## 2016-01-25 MED ORDER — OLANZAPINE 5 MG PO TABS
5.0000 mg | ORAL_TABLET | Freq: Every day | ORAL | Status: DC
  Administered 2016-01-25: 5 mg via ORAL
  Filled 2016-01-25 (×2): qty 1

## 2016-01-25 NOTE — Progress Notes (Signed)
Recreation Therapy Notes  Date: 01/25/16 Time: 1000 Location: 500 Hall Dayroom  Group Topic: Communication  Goal Area(s) Addresses:  Patient will effectively communicate with peers in group.  Patient will verbalize benefit of healthy communication. Patient will verbalize positive effect of healthy communication on post d/c goals.  Patient will identify communication techniques that made activity effective for group.   Intervention:  Dry erase board, eraser, dry erase marker, strips of paper with random words   Activity: [place].  LRT are divided the group into two teams.  Each person would get a turn.  Each person would draw a strip of paer from the container.  The person has to draw whatever is on the paper,  on the board.  While they are drawing, their team is trying to guess what it is they are drawing.  If the team guesses the picture they get a point.     Education:Communication, Discharge Planning  Education Outcome: Needs additional education.   Clinical Observations/Feedback: Pt did not attend group.   Victorino Sparrow, LRT/CTRS        Victorino Sparrow A 01/25/2016 12:07 PM

## 2016-01-25 NOTE — Progress Notes (Signed)
Patient has refused to go to cafeteria all day, we have brought a tray back with each meal and she has not touched any food.  Will deliver a dinner tray with an ensure and will encourage her to eat this evening.  An interpreter will be here to see her at 40. Please follow up in am.  Thanks

## 2016-01-25 NOTE — BHH Group Notes (Signed)
[facility] LCSW Group Therapy  01/25/2016 1:15 pm  Type of Therapy: Process Group Therapy  Participation Level:  Active  Participation Quality:  Appropriate  Affect:  Flat  Cognitive:  Oriented  Insight:  Improving  Engagement in Group:  Limited  Engagement in Therapy:  Limited  Modes of Intervention:  Activity, Clarification, Education, Problem-solving and Support  Summary of Progress/Problems: Today's group addressed the issue of overcoming obstacles.  Patients were asked to identify their biggest obstacle post d/c that stands in the way of their on-going success, and then problem solve as to how to manage this. Invited.  Chose to not attend  Trish Mage 01/25/2016   4:06 PM

## 2016-01-25 NOTE — BHH Counselor (Addendum)
Gathered information from mother, which was a challenge because I needed to use a [place] interpreter, and mother was trying to squeeze a lot of information into 8 minutes before she had to return from her break to work.  Pt is diagnosed with Bipolar D/O and schizophrenia from [facility] in 2010  Symptoms worsened in Feb.    Spent 6 years in jail when in [place].  Charges unclear   She had an accident in [place]-was in jail due to drinking.  Released in November. Was taking medication as prescribed by [facility] in [place]-is consistently non-compliant-has been refusing medication when father is not at home.  Mother is  concerned about her drinking Jail in May through August for trespassing or something.   Haldol 1 in AM 2 in PM 5mg .  She has an appointment on Oct 4.  ADL's is the main problem.

## 2016-01-25 NOTE — Progress Notes (Signed)
Psychoeducational Group Note  Date:  01/25/2016 Time:  2149  Group Topic/Focus:  Wrap-Up Group:   The focus of this group is to help patients review their daily goal of treatment and discuss progress on daily workbooks.   Participation Level: Did Not Attend  Participation Quality:  Not Applicable  Affect:  Not Applicable  Cognitive:  Not Applicable  Insight:  Not Applicable  Engagement in Group: Not Applicable  Additional Comments:  The patient did not attend group this evening.    Archie Balboa S 01/25/2016, 9:49 PM

## 2016-01-25 NOTE — Progress Notes (Addendum)
[facility] MD Progress Note  01/25/2016 2:06 PM Isabella [name]  MRN:  [id number] Subjective: Patient states " I don't know , I am anxious.'  Objective:Isabella Valenciais a 40 y.o.female, who has a hx of schizophrenia , who is single , lives in [place], [place] who presented voluntarily with mother for increased catatonic sx .  Patient seen and chart reviewed.Discussed patient with treatment team. Pt today is seen as withdrawn , answers to questions are limited in monosyllables, pt appears anxious , reports she did have some AH , but unable to elaborate . CSW was able to get collateral information from family - as per notes -"She is Bipolar and schizophrenic.  Symptoms worse in Feb.Diagnosed in [place] in 2010. 6 years in jail. mom concerned about her drinking. Was on Haldol 1 in AM 2 in PM 61m.  She have an appointment on Oct 4.  ADL's is the main problem."   Principal Problem: Schizophrenia, catatonic type ([facility] Diagnosis:   Patient Active Problem List   Diagnosis Date Noted  . 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  Past Medical History: denies Family History: denies hx of Thyroid disease, HTN, DM. Family History  Problem Relation Age of Onset  . Mental illness Neg Hx    Family Psychiatric  History:denies Social History: Please see H&P.  History  Alcohol Use No     History  Drug Use No    Social History   Social History  . Marital status: Single    Spouse name: N/A  . Number of children: N/A  . Years of education: N/A   Social History Main Topics  . Smoking status: Never Smoker  . Smokeless tobacco: Never Used  . Alcohol use No  . Drug use: No  . Sexual activity: Not Asked   Other Topics Concern  . None   Social History Narrative  . None   Additional Social History:    Pain Medications: No abuse reported. Prescriptions: No abuse  reported. Mom reports compliance with all medications. Over the Counter: No abuse reported. History of alcohol / drug use?: No history of alcohol / drug abuse                    Sleep: sleeps through the day   Appetite:  Poor  Current Medications: Current Facility-Administered Medications  Medication Dose Route Frequency Provider Last Rate Last Dose  . acetaminophen (TYLENOL) tablet 650 mg  650 mg Oral Q6H PRN CDara Hoyer PA-C      . alum & mag hydroxide-simeth (MAALOX/MYLANTA) 200-200-20 MG/5ML suspension 30 mL  30 mL Oral Q4H PRN CDara Hoyer PA-C      . benztropine (COGENTIN) tablet 1 mg  1 mg Oral QHS CDara Hoyer PA-C   1 mg at 01/24/16 2132  . citalopram (CELEXA) tablet 10 mg  10 mg Oral Daily SUrsula Alert MD   10 mg at 01/25/16 1228  . feeding supplement (ENSURE ENLIVE) (ENSURE ENLIVE) liquid 237 mL  237 mL Oral BID BM CDara Hoyer PA-C   237 mL at 01/25/16 1000  . hydrOXYzine (ATARAX/VISTARIL) tablet 25 mg  25 mg Oral Q4H PRN CDara Hoyer PA-C      . lisinopril (PRINIVIL,ZESTRIL) tablet 5 mg  5 mg Oral Daily CDara Hoyer PA-C   5 mg at 01/25/16 0835  . risperiDONE (RISPERDAL M-TABS) disintegrating tablet  2 mg  2 mg Oral Q8H PRN Isabella Alert, MD       And  . LORazepam (ATIVAN) tablet 1 mg  1 mg Oral PRN Isabella Alert, MD       And  . ziprasidone (GEODON) injection 20 mg  20 mg Intramuscular PRN Isabella Alert, MD      . magnesium hydroxide (MILK OF MAGNESIA) suspension 30 mL  30 mL Oral Daily PRN Dara Hoyer, PA-C      . OLANZapine (ZYPREXA) tablet 5 mg  5 mg Oral QHS Isabella Alert, MD        Lab Results:  Results for orders placed or performed during the hospital encounter of 01/23/16 (from the past 48 hour(s))  Comprehensive metabolic panel     Status: Abnormal   Collection Time: 01/23/16 11:08 PM  Result Value Ref Range   Sodium 136 135 - 145 mmol/L   Potassium 3.7 3.5 - 5.1 mmol/L   Chloride 102 101 - 111 mmol/L   CO2 24 22 -  32 mmol/L   Glucose, Bld 107 (H) 65 - 99 mg/dL   BUN 17 6 - 20 mg/dL   Creatinine, Ser 0.68 0.44 - 1.00 mg/dL   Calcium 9.8 8.9 - 10.3 mg/dL   Total Protein 8.1 6.5 - 8.1 g/dL   Albumin 4.3 3.5 - 5.0 g/dL   AST 18 15 - 41 U/L   ALT 12 (L) 14 - 54 U/L   Alkaline Phosphatase 71 38 - 126 U/L   Total Bilirubin 0.6 0.3 - 1.2 mg/dL   GFR calc non Af Amer >60 >60 mL/min   GFR calc Af Amer >60 >60 mL/min    Comment: (NOTE) The eGFR has been calculated using the CKD EPI equation. This calculation has not been validated in all clinical situations. eGFR's persistently <60 mL/min signify possible Chronic Kidney Disease.    Anion gap 10 5 - 15  Ethanol     Status: None   Collection Time: 01/23/16 11:08 PM  Result Value Ref Range   Alcohol, Ethyl (B) <5 <5 mg/dL    Comment:        LOWEST DETECTABLE LIMIT FOR SERUM ALCOHOL IS 5 mg/dL FOR MEDICAL PURPOSES ONLY   Salicylate level     Status: None   Collection Time: 01/23/16 11:08 PM  Result Value Ref Range   Salicylate Lvl <4.7 2.8 - 30.0 mg/dL  Acetaminophen level     Status: Abnormal   Collection Time: 01/23/16 11:08 PM  Result Value Ref Range   Acetaminophen (Tylenol), Serum <10 (L) 10 - 30 ug/mL    Comment:        THERAPEUTIC CONCENTRATIONS VARY SIGNIFICANTLY. A RANGE OF 10-30 ug/mL MAY BE AN EFFECTIVE CONCENTRATION FOR MANY PATIENTS. HOWEVER, SOME ARE BEST TREATED AT CONCENTRATIONS OUTSIDE THIS RANGE. ACETAMINOPHEN CONCENTRATIONS >150 ug/mL AT 4 HOURS AFTER INGESTION AND >50 ug/mL AT 12 HOURS AFTER INGESTION ARE OFTEN ASSOCIATED WITH TOXIC REACTIONS.   cbc     Status: Abnormal   Collection Time: 01/23/16 11:08 PM  Result Value Ref Range   WBC 11.8 (H) 4.0 - 10.5 K/uL   RBC 4.56 3.87 - 5.11 MIL/uL   Hemoglobin 14.7 12.0 - 15.0 g/dL   HCT 41.7 36.0 - 46.0 %   MCV 91.4 78.0 - 100.0 fL   MCH 32.2 26.0 - 34.0 pg   MCHC 35.3 30.0 - 36.0 g/dL   RDW 13.2 11.5 - 15.5 %   Platelets 386 150 - 400 K/uL  Blood Alcohol  level:  Lab Results  Component Value Date   ETH <5 54/65/6812    Metabolic Disorder Labs: No results found for: HGBA1C, MPG No results found for: PROLACTIN No results found for: CHOL, TRIG, HDL, CHOLHDL, VLDL, LDLCALC  Physical Findings: AIMS: Facial and Oral Movements Muscles of Facial Expression: None, normal Lips and Perioral Area: None, normal Jaw: None, normal Tongue: None, normal,Extremity Movements Upper (arms, wrists, hands, fingers): None, normal Lower (legs, knees, ankles, toes): None, normal, Trunk Movements Neck, shoulders, hips: None, normal, Overall Severity Severity of abnormal movements (highest score from questions above): None, normal Incapacitation due to abnormal movements: None, normal Patient's awareness of abnormal movements (rate only patient's report): No Awareness, Dental Status Current problems with teeth and/or dentures?: No Does patient usually wear dentures?: No  CIWA:    COWS:     Musculoskeletal: Strength & Muscle Tone: within normal limits Gait & Station: normal Patient leans: N/A  Psychiatric Specialty Exam: Physical Exam  Nursing note and vitals reviewed.   Review of Systems  Psychiatric/Behavioral: Positive for depression. The patient is nervous/anxious and has insomnia.   All other systems reviewed and are negative.   Blood pressure 105/74, pulse 92, temperature 97.6 F (36.4 C), temperature source Oral, resp. rate 18, height 5' 4"  (1.626 m), weight 65.3 kg (144 lb), last menstrual period 01/03/2016.Body mass index is 24.72 kg/m.  General Appearance: Guarded  Eye Contact:  Poor  Speech:  Slow  Volume:  Decreased  Mood:  Anxious and Dysphoric  Affect:  anxious  Thought Process:  Irrelevant and Descriptions of Associations: Loose  Orientation:  Other:  person, situation  Thought Content:  Delusions, Hallucinations: Auditory, Paranoid Ideation and Rumination  Suicidal Thoughts:  denies , but has severe negative sx  Homicidal  Thoughts:  No  Memory:  Immediate;   Fair Recent;   Poor Remote;   Poor  Judgement:  Impaired  Insight:  Shallow  Psychomotor Activity:  Decreased  Concentration:  Concentration: Poor and Attention Span: Poor  Recall:  Poor  Fund of Knowledge:  Poor  Language:  Fair  Akathisia:  No  Handed:  Right  AIMS (if indicated):     Assets:  Desire for Improvement  ADL's:  Impaired  Cognition:  WNL  Sleep:  Number of Hours: 5.75     Treatment Plan Summary:Abrielle Valenciais a 40 y.o.female, who has a hx of schizophrenia , who is single , lives in [place], [place] who presented voluntarily with mother for increased catatonic sx .  Pt seen as disorganized, thought blocking , negative sx, impaired ADLs- will start treatment and observe on the unit.  Daily contact with patient to assess and evaluate symptoms and progress in treatment and Medication management Reviewed past medical records,treatment plan.  Will start Zyprexa 5 mg po qhs for psychosis. Will start celexa 10 mg po daily for affective sx. Will add Ativan 0.5 mg po bid for anxiety sx- ? Catatonia. Will continue to monitor vitals ,medication compliance and treatment side effects while patient is here.  Will monitor for medical issues as well as call consult as needed.  Reviewed labs cbc - - hb - slightly low , cmp - wnl, UDS- negative , BAL <5  ,will order tsh, lipid panel, hba1c, pl as well as EKG for qtc. CSW will start working on disposition.  Patient to participate in therapeutic milieu .      Miche Loughridge, MD 01/25/2016, 2:06 PM

## 2016-01-25 NOTE — Progress Notes (Signed)
D-pt had an bizarre affect this am and has a language barrier, interpreter came this afternoon and is also coming back tonight.  Pt c/o high anxiety and not sleeping well A-pt took her medications but did not attend group R-cont to monitor for safety

## 2016-01-25 NOTE — Progress Notes (Signed)
Patient has been isolative to her room lying in bed this evening. She had family to visit and only came out of her room to take her scheduled medication. Patient is very quick to answer yes or no when writer talked to her.Writer will speak with [facility] and make sure an interpreter can come to help her be communicate with staff and peers. Support given and safety maintained on unit with 15 min checks.

## 2016-01-25 NOTE — Progress Notes (Addendum)
NUTRITION ASSESSMENT  Pt identified as at risk on the Malnutrition Screen Tool  INTERVENTION: 1. Educated patient on the importance of nutrition and encouraged intake of food and beverages. 2. Discussed weight goals. 3. Supplements: Continue Ensure Enlive BID, this supplement provides 350 kcal and 20 grams of protein.   NUTRITION DIAGNOSIS: Unintentional weight loss related to sub-optimal intake as evidenced by pt report.   Goal: Pt to meet >/= 90% of their estimated nutrition needs.  Monitor:  PO intake  Assessment:  Pt is Spanish-speaking. She admitted for medical clearance.  Per notes, mother reported 20 lb weight loss in the past 4 months. Based on CBW, this indicates 12% wt loss in this time frame which is significant. Mother had also reported that pt was not eating or drinking for the 2 weeks PTA. Continue to encourage PO intakes of meals, supplement, and snacks.   40 y.o. female  Height: Ht Readings from Last 1 Encounters:  01/24/16 5\' 4"  (1.626 m)    Weight: Wt Readings from Last 1 Encounters:  01/24/16 144 lb (65.3 kg)    Weight Hx: Wt Readings from Last 10 Encounters:  01/24/16 144 lb (65.3 kg)    BMI:  Body mass index is 24.72 kg/m. Pt meets criteria for normal weight based on current BMI.  Estimated Nutritional Needs: Kcal: 25-30 kcal/kg Protein: > 1 gram protein/kg Fluid: 1 ml/kcal  Diet Order: Diet regular Room service appropriate? Yes; Fluid consistency: Thin Pt is also offered choice of unit snacks mid-morning and mid-afternoon.  Pt is eating as desired.   Lab results and medications reviewed.     Jarome Matin, MS, RD, LDN Inpatient Clinical Dietitian Pager # [phone] After hours/weekend pager # [phone]

## 2016-01-26 LAB — GLUCOSE, CAPILLARY: Glucose-Capillary: 119 mg/dL — ABNORMAL HIGH (ref 65–99)

## 2016-01-26 MED ORDER — HALOPERIDOL 5 MG PO TABS
5.0000 mg | ORAL_TABLET | Freq: Two times a day (BID) | ORAL | Status: DC
  Administered 2016-01-26 – 2016-01-27 (×3): 5 mg via ORAL
  Filled 2016-01-26 (×7): qty 1

## 2016-01-26 MED ORDER — BENZTROPINE MESYLATE 0.5 MG PO TABS
0.5000 mg | ORAL_TABLET | Freq: Two times a day (BID) | ORAL | Status: DC
  Administered 2016-01-26 – 2016-01-27 (×3): 0.5 mg via ORAL
  Filled 2016-01-26 (×7): qty 1

## 2016-01-26 NOTE — Progress Notes (Signed)
Recreation Therapy Notes  INPATIENT RECREATION THERAPY ASSESSMENT  Patient Details Name: [name] MRN: [id number] DOB: 1976-03-03 Today's Date: 01/26/2016  Patient Stressors: Pt could not identify any stressors. Pt stated she was here for "not feeling good" and depression.  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

## 2016-01-26 NOTE — Progress Notes (Signed)
D: Pt denies SI/HI/AVH. Pt is pleasant and cooperative. Pt states she is depressed. Pt. Isolative to room all of shift. A: Pt was offered support and encouragement. Pt was given scheduled medications. Pt was encourage to attend group. Q 15 minute checks were done for safety.  R:Pt did not attend groups. Pt is taking medication. Pt receptive to treatment and safety maintained on unit.

## 2016-01-26 NOTE — Progress Notes (Addendum)
D: Pt presents anxious on initial contact. Pt reports dizziness this AM during medication pass, stated to writer "I'm dizzy". Denied SI, HI, AVH and pain at the time. Visible in milieu at brief intervals during shift for medications A: Support and availability provided to pt. Scheduled AM Lisinopril held due to c/o dizziness and Dr. Shea Evans made aware, no new orders received. Fluids offered throughout this shift. (Gatorade, Ensure etc). No change in mental status noted related to falls.  Pt encouraged to change position slowly to prevent falls related to orthostatic hypotension and to attend groups. Q 15 minutes checks maintained without falls or self injurious behavior to note at this time.  R: Pt receptive to care. Compliant with medications, denies adverse drug reactions. Pt  attended scheduled group. Tolerated fluids and meals well. Remains safe on unit. POC maintained.

## 2016-01-26 NOTE — Progress Notes (Signed)
Patient was standing in hall waiting to have labs collected and became lightheaded and slid to floor against wall. Patient did not hit her head and she did not have any noted injuries from fall. Patient with vitals obtained and, fluids given, and assisted to sit in chair. FSBS obtained. Patient educated on fall prevention and instructed to increase meal and fluid intake. Patient escorted back to room and bed. Labs not collected at this time. PA Patriciaann Clan notified. Mother Josephina Shih) notified of fall. Patient verbalized understanding and monitoring continues. Patient remains safe on unit. Vitals 97.1 117/78 70 18 98% room air.

## 2016-01-26 NOTE — Tx Team (Signed)
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 acetaminophen (TYLENOL) tablet 650 mg  650 mg Oral Q6H PRN Dara Hoyer, PA-C      . alum & mag hydroxide-simeth (MAALOX/MYLANTA) 200-200-20 MG/5ML suspension 30 mL  30 mL Oral Q4H PRN Dara Hoyer, PA-C      . benztropine (COGENTIN) tablet 1 mg  1 mg Oral QHS Dara Hoyer, PA-C   1 mg at 01/25/16 2110  . citalopram (CELEXA) tablet 10 mg  10 mg Oral Daily Ursula Alert, MD   10 mg at 01/25/16 1228  . feeding supplement (ENSURE ENLIVE) (ENSURE ENLIVE) liquid 237 mL  237 mL Oral BID BM Dara Hoyer, PA-C   237 mL at 01/25/16 1000  . hydrOXYzine (ATARAX/VISTARIL) tablet 25 mg  25 mg Oral Q4H PRN Dara Hoyer, PA-C      . lisinopril (PRINIVIL,ZESTRIL) tablet 5 mg  5 mg Oral Daily Dara Hoyer, PA-C   5 mg at 01/25/16 0835  . LORazepam (ATIVAN) tablet 0.5 mg  0.5 mg Oral BID Ursula Alert, MD   0.5 mg at 01/25/16 1700  . risperiDONE (RISPERDAL M-TABS) disintegrating tablet 2 mg  2 mg Oral Q8H PRN Ursula Alert, MD       And  . LORazepam (ATIVAN) tablet 1 mg  1 mg Oral PRN Ursula Alert, MD       And  . ziprasidone (GEODON) injection 20 mg  20 mg Intramuscular PRN Ursula Alert, MD      . magnesium hydroxide (MILK OF MAGNESIA) suspension 30 mL  30 mL Oral Daily PRN Dara Hoyer, PA-C      . OLANZapine (ZYPREXA) tablet 5 mg  5 mg Oral QHS Ursula Alert, MD   5 mg at 01/25/16 2109  . traZODone (DESYREL) tablet 50 mg  50 mg Oral QHS PRN Ursula Alert, MD        PTA Medications: Prescriptions Prior to Admission  Medication Sig Dispense Refill Last Dose  . benztropine (COGENTIN)  0.5 MG tablet Take 0.5-1 mg by mouth 2 (two) times daily. 0.5 mg in the morning & 1 mg at bedtime   01/23/2016 at Unknown time  . haloperidol (HALDOL) 5 MG tablet Take 5 mg by mouth 2 (two) times daily.    01/23/2016 at Unknown time  . lisinopril (PRINIVIL,ZESTRIL) 5 MG tablet Take 5 mg by mouth daily.   01/23/2016 at Unknown time    Treatment Modalities: Medication Management, Group therapy, Case management,  1 to 1 session with clinician, Psychoeducation, Recreational therapy.   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  Therapeutic Interventions: 1 to 1 sessions, Unit Group sessions and Medication administration.  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  Therapeutic Interventions: 1 to 1 sessions, Unit Group sessions and Medication administration.  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 RN will report any adverse and/or side effects to prescribing  provider.  Therapeutic Interventions: 1 on 1 counseling sessions, Psychoeducation, Medication administration, Evaluate responses to treatment, Monitor vital signs and CBGs as ordered, Perform/monitor CIWA, COWS, AIMS and Fall Risk screenings as ordered, Perform wound care treatments as ordered.  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  Short Term Goals: Engage patient in aftercare planning with referrals and resources  Therapeutic Interventions: Assess for all discharge needs, 1 to 1 time with Social worker, Explore available resources and support systems, Assess for adequacy in community support network, Educate family and significant other(s) on suicide prevention, Complete Psychosocial Assessment, Interpersonal group therapy.  Evaluation of Outcomes: Met    See below   Progress in Treatment: Attending groups: Yes Participating in groups: Yes Taking medication as prescribed: Yes Toleration medication: Yes, no side effects reported at this time Family/Significant other contact made: Yes mother Patient understands diagnosis: No  Limited insight Discussing patient identified problems/goals with staff: Yes Medical problems stabilized or resolved: Yes Denies suicidal/homicidal ideation: Yes Issues/concerns per patient self-inventory: None Other: N/A  New problem(s) identified: None identified at this time.   New Short Term/Long Term Goal(s): None identified at this time.   Discharge Plan or Barriers:  Return home, follow up outpt  Reason for Continuation of Hospitalization: Depression Hallucinations Catatonia-like symptoms Medication stabilization   Estimated Length of Stay: 3-5 days  Attendees: Patient: 01/26/2016  8:24 AM  Physician: Ursula Alert,  MD 01/26/2016  8:24 AM  Nursing: Hoy Register, RN 01/26/2016  8:24 AM  RN Care Manager: Lars Pinks, RN 01/26/2016  8:24 AM  Social Worker: Ripley Fraise 01/26/2016  8:24 AM  Recreational Therapist: Laretta Bolster  01/26/2016  8:24 AM  Other: Norberto Sorenson 01/26/2016  8:24 AM  Other:  01/26/2016  8:24 AM    Scribe for Treatment Team:  Roque Lias 01/26/2016 8:24 AM

## 2016-01-26 NOTE — [facility] Group Notes (Signed)
[place] LCSW Group Therapy  01/26/2016 , 1:43 PM   Type of Therapy:  Group Therapy  Participation Level:  Active  Participation Quality:  Attentive  Affect:  Appropriate  Cognitive:  Alert  Insight:  Improving  Engagement in Therapy:  Engaged  Modes of Intervention:  Discussion, Exploration and Socialization  Summary of Progress/Problems: Today's group focused on the term Diagnosis.  Participants were asked to define the term, and then pronounce whether it is a negative, positive or neutral term. Invited.  Chose to not attend. Roque Lias B 01/26/2016 , 1:43 PM

## 2016-01-26 NOTE — Progress Notes (Signed)
Did not attend group 

## 2016-01-26 NOTE — Progress Notes (Signed)
[facility] MD Progress Note  01/26/2016 1:41 PM Isabella [name]  MRN:  [id number] Subjective: Patient states " I am ok."   Objective:Isabella Valenciais a 40 y.o.female, who has a hx of schizophrenia , who is single , lives in [place], [place] who presented voluntarily with mother for increased catatonic sx .  Patient seen and chart reviewed.Discussed patient with treatment team. Pt today continues to be  seen as withdrawn , answers to questions are limited to monosyllables. Pt appears disheveled and isolative . Per staff - pt had a fall this AM , when she felt dizzy , had not injuries, her BP was trending low . Pt is on Lisinopril - will hold it until her BP is higher. Pt also on Zyprexa which also could have contributed to her hypotensive episode. Per CSW - pt per mother did well on haldol while she was taking it . Hence will change zyprexa to haldol.      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  Past Medical History: denies Family History: denies hx of Thyroid disease, HTN, DM. Family History  Problem Relation Age of Onset  . Mental illness Neg Hx    Family Psychiatric  History:denies Social History: Please see H&P.  History  Alcohol Use No     History  Drug Use No    Social History   Social History  . Marital status: Single    Spouse name: N/A  . Number of children: N/A  . Years of education: N/A   Social History Main Topics  . Smoking status: Never Smoker  . Smokeless tobacco: Never Used  . Alcohol use No  . Drug use: No  . Sexual activity: Not Asked   Other Topics Concern  . None   Social History Narrative  . None   Additional Social History:    Pain Medications: No abuse reported. Prescriptions: No abuse  reported. Mom reports compliance with all medications. Over the Counter: No abuse reported. History of alcohol / drug use?: No history of alcohol / drug abuse                    Sleep: sleeps through the day   Appetite:  Poor  Current Medications: Current Facility-Administered Medications  Medication Dose Route Frequency Provider Last Rate Last Dose  . acetaminophen (TYLENOL) tablet 650 mg  650 mg Oral Q6H PRN Dara Hoyer, PA-C      . alum & mag hydroxide-simeth (MAALOX/MYLANTA) 200-200-20 MG/5ML suspension 30 mL  30 mL Oral Q4H PRN Dara Hoyer, PA-C      . benztropine (COGENTIN) tablet 0.5 mg  0.5 mg Oral BID Ursula Alert, MD   0.5 mg at 01/26/16 1010  . citalopram (CELEXA) tablet 10 mg  10 mg Oral Daily Ursula Alert, MD   10 mg at 01/26/16 0844  . feeding supplement (ENSURE ENLIVE) (ENSURE ENLIVE) liquid 237 mL  237 mL Oral BID BM Dara Hoyer, PA-C   237 mL at 01/25/16 1000  . haloperidol (HALDOL) tablet 5 mg  5 mg Oral BID Ursula Alert, MD   5 mg at 01/26/16 1010  . hydrOXYzine (ATARAX/VISTARIL) tablet 25 mg  25 mg Oral Q4H PRN Dara Hoyer, PA-C      . LORazepam (ATIVAN) tablet  0.5 mg  0.5 mg Oral BID Ursula Alert, MD   0.5 mg at 01/26/16 0844  . risperiDONE (RISPERDAL M-TABS) disintegrating tablet 2 mg  2 mg Oral Q8H PRN Ursula Alert, MD       And  . LORazepam (ATIVAN) tablet 1 mg  1 mg Oral PRN Ursula Alert, MD       And  . ziprasidone (GEODON) injection 20 mg  20 mg Intramuscular PRN Ursula Alert, MD      . magnesium hydroxide (MILK OF MAGNESIA) suspension 30 mL  30 mL Oral Daily PRN Dara Hoyer, PA-C      . traZODone (DESYREL) tablet 50 mg  50 mg Oral QHS PRN Ursula Alert, MD        Lab Results:  Results for orders placed or performed during the hospital encounter of 01/24/16 (from the past 48 hour(s))  Glucose, capillary     Status: Abnormal   Collection Time: 01/26/16  7:17 AM  Result Value Ref Range   Glucose-Capillary 119 (H) 65  - 99 mg/dL    Blood Alcohol level:  Lab Results  Component Value Date   ETH <5 [id number]    Metabolic Disorder Labs: No results found for: HGBA1C, MPG No results found for: PROLACTIN No results found for: CHOL, TRIG, HDL, CHOLHDL, VLDL, LDLCALC  Physical Findings: AIMS: Facial and Oral Movements Muscles of Facial Expression: None, normal Lips and Perioral Area: None, normal Jaw: None, normal Tongue: None, normal,Extremity Movements Upper (arms, wrists, hands, fingers): None, normal Lower (legs, knees, ankles, toes): None, normal, Trunk Movements Neck, shoulders, hips: None, normal, Overall Severity Severity of abnormal movements (highest score from questions above): None, normal Incapacitation due to abnormal movements: None, normal Patient's awareness of abnormal movements (rate only patient's report): No Awareness, Dental Status Current problems with teeth and/or dentures?: No Does patient usually wear dentures?: No  CIWA:    COWS:     Musculoskeletal: Strength & Muscle Tone: within normal limits Gait & Station: normal Patient leans: N/A  Psychiatric Specialty Exam: Physical Exam  Nursing note and vitals reviewed.   Review of Systems  Psychiatric/Behavioral: Positive for depression. The patient is nervous/anxious and has insomnia.   All other systems reviewed and are negative.   Blood pressure 107/75, pulse 96, temperature 97.5 F (36.4 C), temperature source Oral, resp. rate 18, height 5\' 4"  (1.626 m), weight 65.3 kg (144 lb), last menstrual period 01/03/2016.Body mass index is 24.72 kg/m.  General Appearance: Guarded  Eye Contact:  Poor  Speech:  Slow  Volume:  Decreased  Mood:  Anxious and Dysphoric  Affect:  anxious  Thought Process:  Irrelevant and Descriptions of Associations: Loose  Orientation:  Other:  person, situation  Thought Content:  Delusions, Hallucinations: Auditory, Paranoid Ideation and Rumination  Suicidal Thoughts:  denies , but has  severe negative sx  Homicidal Thoughts:  No  Memory:  Immediate;   Fair Recent;   Poor Remote;   Poor  Judgement:  Impaired  Insight:  Shallow  Psychomotor Activity:  Decreased  Concentration:  Concentration: Poor and Attention Span: Poor  Recall:  Poor  Fund of Knowledge:  Poor  Language:  Fair  Akathisia:  No  Handed:  Right  AIMS (if indicated):     Assets:  Desire for Improvement  ADL's:  Impaired  Cognition:  WNL  Sleep:  Number of Hours: 3.25     Treatment Plan Summary:Isabella Valenciais a 40 y.o.female, who has a hx of schizophrenia , who is  single , lives in [place], [place] who presented voluntarily with mother for increased catatonic sx .  Pt seen as disorganized, thought blocking , negative sx, impaired ADLs- will continue treatment and observe on the unit.  Daily contact with patient to assess and evaluate symptoms and progress in treatment and Medication management Reviewed past medical records,treatment plan.  Will discontinue  Zyprexa for ADRs. Will start Haldol 5 mg po bid for psychosis. Pt did well on the same in the past. Will continue celexa 10 mg po daily for affective sx. Will continue  Ativan 0.5 mg po bid for anxiety sx- ? Catatonia. Will discontinue Lisinopril due to low BP . Will monitor BP . Will continue to monitor vitals ,medication compliance and treatment side effects while patient is here.  Will monitor for medical issues as well as call consult as needed.  Reviewed labs cbc - - hb - slightly low , cmp - wnl, UDS- negative , BAL <5  ,pending tsh, lipid panel, hba1c, pl as well as EKG for qtc. CSW will continue working on disposition.  Patient to participate in therapeutic milieu .      Joneric Streight, MD 01/26/2016, 1:41 PM

## 2016-01-26 NOTE — Progress Notes (Signed)
Recreation Therapy Notes  Date: 01/26/16 Time: 1000 Location:  [address] Hall Group Room  Group Topic: Self-Esteem  Goal Area(s) Addresses:  Patient will identify the importance of recognizing your uniqueness. Patient will verbalize benefit of increased self-esteem.  Intervention: Architect paper, magazines, scissors, glue sticks, colored pencils  Activity: Advertising.  LRT went over the concept of advertising and the purpose of it.  Patients were to look through the magazines to find pictures or words that describe and highlight the uniqueness about them.  Patients could also use colored pencils to write words or draw pictures if they couldn't find any in the magazines.   Education:  Self-Esteem, Dentist.   Education Outcome: Needs additional education  Clinical Observations/Feedback: Pt did not attend group.   Victorino Sparrow, LRT/CTRS      Victorino Sparrow A 01/26/2016 12:06 PM

## 2016-01-27 MED ORDER — HALOPERIDOL 5 MG PO TABS
ORAL_TABLET | ORAL | Status: AC
  Filled 2016-01-27: qty 1

## 2016-01-27 MED ORDER — HALOPERIDOL 5 MG PO TABS
7.5000 mg | ORAL_TABLET | Freq: Every day | ORAL | Status: DC
  Administered 2016-01-27: 7.5 mg via ORAL
  Filled 2016-01-27 (×3): qty 2

## 2016-01-27 MED ORDER — HALOPERIDOL 5 MG PO TABS
5.0000 mg | ORAL_TABLET | Freq: Once | ORAL | Status: AC
  Administered 2016-01-27: 5 mg via ORAL
  Filled 2016-01-27: qty 1

## 2016-01-27 MED ORDER — BENZTROPINE MESYLATE 0.5 MG PO TABS: 0.5000 mg | ORAL_TABLET | Freq: Every day | ORAL | Status: DC

## 2016-01-27 MED ORDER — INFLUENZA VAC SPLIT QUAD 0.5 ML IM SUSY
0.5000 mL | PREFILLED_SYRINGE | INTRAMUSCULAR | Status: DC
  Filled 2016-01-27: qty 0.5

## 2016-01-27 MED ORDER — CITALOPRAM HYDROBROMIDE 20 MG PO TABS
20.0000 mg | ORAL_TABLET | Freq: Every day | ORAL | Status: DC
  Administered 2016-01-28 – 2016-01-31 (×4): 20 mg via ORAL
  Filled 2016-01-27 (×6): qty 1

## 2016-01-27 MED ORDER — LORAZEPAM 0.5 MG PO TABS
0.5000 mg | ORAL_TABLET | Freq: Every day | ORAL | Status: DC
  Administered 2016-01-27: 0.5 mg via ORAL
  Filled 2016-01-27: qty 1

## 2016-01-27 MED ORDER — BENZTROPINE MESYLATE 0.5 MG PO TABS
0.5000 mg | ORAL_TABLET | Freq: Every morning | ORAL | Status: DC
  Administered 2016-01-28 – 2016-02-04 (×8): 0.5 mg via ORAL
  Filled 2016-01-27 (×11): qty 1

## 2016-01-27 MED ORDER — HALOPERIDOL 5 MG PO TABS
5.0000 mg | ORAL_TABLET | Freq: Every morning | ORAL | Status: DC
  Administered 2016-01-28 – 2016-01-30 (×3): 5 mg via ORAL
  Filled 2016-01-27 (×5): qty 1

## 2016-01-27 MED ORDER — BENZTROPINE MESYLATE 1 MG PO TABS
1.0000 mg | ORAL_TABLET | Freq: Every day | ORAL | Status: DC
  Administered 2016-01-27: 1 mg via ORAL
  Filled 2016-01-27 (×3): qty 1

## 2016-01-27 NOTE — Progress Notes (Signed)
Adult Psychoeducational Group Note  Date:  01/27/2016 Time:  9:38 PM  Group Topic/Focus:  Wrap-Up Group:   The focus of this group is to help patients review their daily goal of treatment and discuss progress on daily workbooks.   Participation Level:  Did Not Attend  Participation Quality:  Did not attend  Affect:  Did not attend  Cognitive:  Did not attend  Insight: None  Engagement in Group:  Did not attend  Modes of Intervention:  Did not attend  Additional Comments:  Patient did not attend wrap up group tonight.  Bastien Strawser L Noell Shular 01/27/2016, 9:38 PM

## 2016-01-27 NOTE — Progress Notes (Signed)
[facility] MD Progress Note  01/27/2016 2:11 PM Isabella [name]  MRN:  [id number] Subjective: Patient states " I am ok."   Objective:Isabella Valenciais a 40 y.o.female, who has a hx of schizophrenia , who is single , lives in [place], [place] who presented voluntarily with mother for increased catatonic sx .  Patient seen and chart reviewed.Discussed patient with treatment team. Pt today seen as awake, alert , was able to sit up on the bed and participate in evaluation process , which is an improvement. Pt continues to answer questions in monosyllables. Pt is unable to verbalize much about how she feels, however per staff she has been noted as having restlessness on and off. She has been taking her medications. Is noted as disorganized often , and having restless sleep.     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  Past Medical History: denies Family History: denies hx of Thyroid disease, HTN, DM. Family History  Problem Relation Age of Onset  . Mental illness Neg Hx    Family Psychiatric  History:denies Social History: Please see H&P.  History  Alcohol Use No     History  Drug Use No    Social History   Social History  . Marital status: Single    Spouse name: N/A  . Number of children: N/A  . Years of education: N/A   Social History Main Topics  . Smoking status: Never Smoker  . Smokeless tobacco: Never Used  . Alcohol use No  . Drug use: No  . Sexual activity: Not Asked   Other Topics Concern  . None   Social History Narrative  . None   Additional Social History:    Pain Medications: No abuse reported. Prescriptions: No abuse reported. Mom reports compliance with all medications. Over the Counter: No abuse  reported. History of alcohol / drug use?: No history of alcohol / drug abuse                    Sleep: restless  Appetite:  Poor  Current Medications: Current Facility-Administered Medications  Medication Dose Route Frequency Provider Last Rate Last Dose  . acetaminophen (TYLENOL) tablet 650 mg  650 mg Oral Q6H PRN Dara Hoyer, PA-C   650 mg at 01/27/16 0955  . alum & mag hydroxide-simeth (MAALOX/MYLANTA) 200-200-20 MG/5ML suspension 30 mL  30 mL Oral Q4H PRN Dara Hoyer, PA-C      . Derrill Memo ON 01/28/2016] benztropine (COGENTIN) tablet 0.5 mg  0.5 mg Oral q morning - 10a Euel Castile, MD      . benztropine (COGENTIN) tablet 1 mg  1 mg Oral QHS Ursula Alert, MD      . Derrill Memo ON 01/28/2016] citalopram (CELEXA) tablet 20 mg  20 mg Oral Daily Andrey Mccaskill, MD      . feeding supplement (ENSURE ENLIVE) (ENSURE ENLIVE) liquid 237 mL  237 mL Oral BID BM Dara Hoyer, PA-C   237 mL at 01/27/16 0837  . [START ON 01/28/2016] haloperidol (HALDOL) tablet 5 mg  5 mg Oral q morning - 10a Trinidi Toppins, MD      . haloperidol (HALDOL) tablet 7.5 mg  7.5 mg Oral QHS Rigoberto Repass, MD      . hydrOXYzine (ATARAX/VISTARIL) tablet 25 mg  25 mg Oral Q4H PRN Dara Hoyer, PA-C      . [START ON 01/28/2016] Influenza vac split quadrivalent PF (FLUARIX) injection 0.5 mL  0.5 mL Intramuscular Tomorrow-1000 Tzvi Economou, MD      . LORazepam (ATIVAN) tablet 0.5 mg  0.5 mg Oral QHS Yassmine Tamm, MD      . risperiDONE (RISPERDAL M-TABS) disintegrating tablet 2 mg  2 mg Oral Q8H PRN Ursula Alert, MD       And  . LORazepam (ATIVAN) tablet 1 mg  1 mg Oral PRN Ursula Alert, MD       And  . ziprasidone (GEODON) injection 20 mg  20 mg Intramuscular PRN Ursula Alert, MD      . magnesium hydroxide (MILK OF MAGNESIA) suspension 30 mL  30 mL Oral Daily PRN Dara Hoyer, PA-C      . traZODone (DESYREL) tablet 50 mg  50 mg Oral QHS PRN Ursula Alert, MD   50 mg at 01/26/16 2238    Lab  Results:  Results for orders placed or performed during the hospital encounter of 01/24/16 (from the past 48 hour(s))  Glucose, capillary     Status: Abnormal   Collection Time: 01/26/16  7:17 AM  Result Value Ref Range   Glucose-Capillary 119 (H) 65 - 99 mg/dL    Blood Alcohol level:  Lab Results  Component Value Date   ETH <5 [id number]    Metabolic Disorder Labs: No results found for: HGBA1C, MPG No results found for: PROLACTIN No results found for: CHOL, TRIG, HDL, CHOLHDL, VLDL, LDLCALC  Physical Findings: AIMS: Facial and Oral Movements Muscles of Facial Expression: None, normal Lips and Perioral Area: None, normal Jaw: None, normal Tongue: None, normal,Extremity Movements Upper (arms, wrists, hands, fingers): None, normal Lower (legs, knees, ankles, toes): None, normal, Trunk Movements Neck, shoulders, hips: None, normal, Overall Severity Severity of abnormal movements (highest score from questions above): None, normal Incapacitation due to abnormal movements: None, normal Patient's awareness of abnormal movements (rate only patient's report): No Awareness, Dental Status Current problems with teeth and/or dentures?: No Does patient usually wear dentures?: No  CIWA:    COWS:     Musculoskeletal: Strength & Muscle Tone: within normal limits Gait & Station: normal Patient leans: N/A  Psychiatric Specialty Exam: Physical Exam  Nursing note and vitals reviewed.   Review of Systems  Psychiatric/Behavioral: Positive for depression. The patient is nervous/anxious and has insomnia.   All other systems reviewed and are negative.   Blood pressure (!) 105/57, pulse (!) 57, temperature 97.7 F (36.5 C), temperature source Oral, resp. rate 16, height 5\' 4"  (1.626 m), weight 65.3 kg (144 lb), last menstrual period 01/03/2016, SpO2 99 %.Body mass index is 24.72 kg/m.  General Appearance: Guarded  Eye Contact:  Fair  Speech:  Slow  Volume:  Decreased  Mood:  Anxious and  Dysphoric  Affect:  anxious  Thought Process:  Irrelevant and Descriptions of Associations: Circumstantial more relevant  Orientation:  Other:  person, situation  Thought Content:  Delusions, Hallucinations: Auditory, Paranoid Ideation and Rumination  Suicidal Thoughts:  denies , but has severe negative sx  Homicidal Thoughts:  No  Memory:  Immediate;   Fair Recent;   Poor Remote;   Poor  Judgement:  Impaired  Insight:  Shallow  Psychomotor Activity:  Decreased  Concentration:  Concentration: Poor and Attention Span: Poor  Recall:  Poor  Fund of Knowledge:  Poor  Language:  Fair  Akathisia:  No  Handed:  Right  AIMS (if indicated):     Assets:  Desire for Improvement  ADL's:  Impaired  Cognition:  WNL  Sleep:  Number of Hours: 3.25     Treatment Plan Summary:Isabella Valenciais a 40 y.o.female, who has a hx of schizophrenia , who is single , lives in [place], [place] who presented voluntarily with mother for increased catatonic sx .  Pt seen as disorganized, thought blocking , negative sx, impaired ADLs, although progressing - will continue treatment and observe on the unit.  Daily contact with patient to assess and evaluate symptoms and progress in treatment and Medication management Reviewed past medical records,treatment plan.  Will increase Haldol to 5 mg po daily and 7.5 mg po qhs for psychosis. Pt did well on the same in the past. Will increase celexa to 20  mg po daily for affective sx. Will reduce Ativan to 0.5 mg po qhs  for anxiety sx- ? Catatonia. Will discontinue Lisinopril due to low BP . Will monitor BP . Will continue to monitor vitals ,medication compliance and treatment side effects while patient is here.  Will monitor for medical issues as well as call consult as needed.  Reviewed labs cbc - - hb - slightly low , cmp - wnl, UDS- negative , BAL <5  ,pending tsh, lipid panel, hba1c, pl as well as EKG for qtc. CSW will continue working on disposition.  Patient to  participate in therapeutic milieu .      Isabella Garretson, MD 01/27/2016, 2:11 PM

## 2016-01-27 NOTE — BHH Suicide Risk Assessment (Signed)
[place] INPATIENT:  Family/Significant Other Suicide Prevention Education  Suicide Prevention Education:  Education Completed; No one has been identified by the patient as the family member/significant other with whom the patient will be residing, and identified as the person(s) who will aid the patient in the event of a mental health crisis (suicidal ideations/suicide attempt).  With written consent from the patient, the family member/significant other has been provided the following suicide prevention education, prior to the and/or following the discharge of the patient.  The suicide prevention education provided includes the following:  Suicide risk factors  Suicide prevention and interventions  National Suicide Hotline telephone number  [facility] assessment telephone number  [facility] Emergency Assistance [place] and/or Residential Mobile Crisis Unit telephone number  Request made of family/significant other to:  Remove weapons (e.g., guns, rifles, knives), all items previously/currently identified as safety concern.    Remove drugs/medications (over-the-counter, prescriptions, illicit drugs), all items previously/currently identified as a safety concern.  The family member/significant other verbalizes understanding of the suicide prevention education information provided.  The family member/significant other agrees to remove the items of safety concern listed above. The patient did not endorse SI at the time of admission, nor did the patient c/o SI during the stay here.  SPE not required.   Trish Mage 01/27/2016, 3:40 PM

## 2016-01-27 NOTE — BHH Group Notes (Signed)
[place] LCSW Group Therapy  01/27/2016 2:31 PM   Type of Therapy:  Group Therapy   Participation Level:  Engaged  Participation Quality:  Attentive  Affect:  Appropriate   Cognitive:  Alert   Insight:  Engaged  Engagement in Therapy:  Improving   Modes of Intervention:  Education, Exploration, Socialization   Summary of Progress/Problems: Isabella [name] was invited to group, chose not to attend.   Shanon Brow from the [facility] was here to tell his story of recovery, inform patients about MHA and play his guitar.   Radonna Ricker 01/27/2016 2:31 PM

## 2016-01-27 NOTE — Progress Notes (Signed)
Recreation Therapy Notes  Date: 01/27/16 Time: 1000 Location: 500 Hall Dayroom  Group Topic: Coping Skills  Goal Area(s) Addresses:  Patient will be able to identify positive coping skills. Patient will be able to identify how using positive coping skills will help them post d/c.  Intervention: Worksheet, colored pencils  Activity: Building surveyor.  Patients were given a worksheet with a Building surveyor.  Patients were to identify the situations they are facing that have them "stuck" and write them within the lines of the web.  Patients were then asked to come up with coping skills for each of the situations they are facing.  Education: Radiographer, therapeutic, Dentist.   Education Outcome: Needs additional education.   Clinical Observations/Feedback: Pt did not attend group.   Victorino Sparrow, LRT/CTRS         Victorino Sparrow A 01/27/2016 12:48 PM

## 2016-01-27 NOTE — Progress Notes (Signed)
   D: Pt was laying down prior to the assessment. Pt attempted to answer questions without the interpretor. However, with pt's accent and rapid speech the writer found it difficult to understand the pt.  Pt has no questions or concerns.   A:  Support and encouragement was offered. 15 min checks continued for safety.  R: Pt remains safe.

## 2016-01-27 NOTE — Progress Notes (Signed)
Nursing Note 01/27/2016 [id number]  Data Reports sleeping good.  Did not complete self-inventory. Denies psychological complaints, denies SI HI and AVH.  Disorganized.  Complained of leg pain "sometimes in the right and sometimes in the left, it's worse when I walk."  When asked pain level she said "8."  Reports tylenol was effective.  Said "it helped.  It's a 1,2,3, 7."  Pain difficult to assess.  Relaxed posture, resting in bed during discussion.   Affect blunted but brighter than admission mood "I'm ok."  Somewhat restless today, observed walking around in room, was standing over roommates bed at one moment but redirectable.  Patient ate a lot for lunch per tech, taking ensure supplements as ordered.  Stays in room, but goes to meals with encouragement.  This afternoon patient walked into another patient's room while this nurse was there, nurse redirected patient, she stated "I was looking for my family, I thought I heard their voices.  I think they might have gone downstairs."  Patient reoriented.  Action Spoke with patient 1:1, nurse offered support to patient throughout shift.  Received order for no roommate. Encouraged to leave room and attend groups.  Continues to be monitored on 15 minute checks for safety.  Response Remains safe and appropriate, though isolative.

## 2016-01-28 MED ORDER — HALOPERIDOL 5 MG PO TABS
10.0000 mg | ORAL_TABLET | Freq: Every day | ORAL | Status: DC
  Administered 2016-01-28: 10 mg via ORAL
  Filled 2016-01-28 (×3): qty 2

## 2016-01-28 MED ORDER — TRAZODONE HCL 100 MG PO TABS
100.0000 mg | ORAL_TABLET | Freq: Every day | ORAL | Status: DC
  Administered 2016-01-28: 100 mg via ORAL
  Filled 2016-01-28 (×3): qty 1

## 2016-01-28 MED ORDER — LORAZEPAM 1 MG PO TABS
1.0000 mg | ORAL_TABLET | Freq: Every day | ORAL | Status: DC
  Administered 2016-01-28: 1 mg via ORAL
  Filled 2016-01-28: qty 1

## 2016-01-28 MED ORDER — BENZTROPINE MESYLATE 2 MG PO TABS
2.0000 mg | ORAL_TABLET | Freq: Every day | ORAL | Status: DC
  Administered 2016-01-28 – 2016-01-29 (×2): 2 mg via ORAL
  Filled 2016-01-28 (×2): qty 1
  Filled 2016-01-28: qty 2
  Filled 2016-01-28 (×2): qty 1

## 2016-01-28 NOTE — Progress Notes (Signed)
Recreation Therapy Notes  Date: 01/28/16 Time: 1000 Location: 500 Hall Dayroom  Group Topic: Communication, Team Building, Problem Solving  Goal Area(s) Addresses:  Patient will effectively work with peer towards shared goal.  Patient will identify skill used to make activity successful.  Patient will identify how skills used during activity can be used to reach post d/c goals.   Behavioral Response: Engaged  Intervention: STEM Activity   Activity: [employer]. Patients were provided the following materials: 5 drinking straws, 5 rubber bands, 5 paper clips, 2 index cards, 2 drinking cups, and 2 toilet paper rolls. Using the provided materials patients were asked to build a launching mechanisms to launch a ping pong ball approximately 12 feet. Patients were divided into teams of 3-5.   Education: Education officer, community, Dentist.   Education Outcome: Acknowledges education/In group clarification offered/Needs additional education.   Clinical Observations/Feedback: Pt worked well with her peers.  Pt stated the activity gave her the opportunity "to see how other people worked".  Pt also stated they had to communicate to complete the activity.     Victorino Sparrow, LRT/CTRS     Ria Comment, Cortlan Dolin A 01/28/2016 12:00 PM

## 2016-01-28 NOTE — Progress Notes (Addendum)
Nursing Note 01/28/2016 [id number]  Data Reports sleeping poorly with PRN sleep med- 0.5 hours of sleep documented on [place] rounds.   Rates depression 8/10, hopelessness 8/10, and anxiety 6/10. Affect flat.  Denies HI, SI, AVH.  Patient more active today, interacting with student and interpreter.  Patient attended groups, went to all meals, took a shower today after being prompted, and took a nap in the afternoon.  Confused, when asked what year it was patient said "[birth year]."  Later told the student it was "2016" and "September.  Stated she was in [place].  Reoriented to date/location.  Speech still somewhat disorganized, sometimes speaks quickly.  Speaks well in English at times, but then speech will become disorganized.  Per interpreter patient was speaking logically during group and sharing.  Patient not observed going into other patients rooms.  Appears to be responding to internal stimuli at times, patient at one point per nursing student was having conversation with a patient that was not in room.  Action Spoke with patient 1:1, nurse offered support to patient throughout shift.  Continues to be monitored on 15 minute checks for safety. Continues on room restriction for confusion/boundaries.  Response Remains safe and appropriate, though still confused.  Took a shower today.  Eating well (going to all meals, taking ensure supplements).

## 2016-01-28 NOTE — Progress Notes (Signed)
   D:Pt continues to lay in bed and not attend the evening group. Informed the writer that she was still "dizzy" from her medications.  Per pt's mom, pt is "talking to herself" and saying that "children were here today in her room". Pt's mother informed the Probation officer that pt has been divorced for 7 yrs. Stated that pt's husband took "everything including the kids". Pt's mother is requesting a call from the Dr while the interpretor is present. Mother wants to give collateral information. Pt has no other questions or concerns.   A:  Support and encouragement was offered. 15 min checks continued for safety.  R: Pt remains safe.

## 2016-01-28 NOTE — Tx Team (Signed)
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 acetaminophen (TYLENOL) tablet 650 mg  650 mg Oral Q6H PRN Dara Hoyer, PA-C   650 mg at 01/27/16 0955  . alum & mag hydroxide-simeth (MAALOX/MYLANTA) 200-200-20 MG/5ML suspension 30 mL  30 mL Oral Q4H PRN Dara Hoyer, PA-C      . benztropine (COGENTIN) tablet 0.5 mg  0.5 mg Oral q morning - 10a Saramma Eappen, MD      . benztropine (COGENTIN) tablet 1 mg  1 mg Oral QHS Ursula Alert, MD   1 mg at 01/27/16 2133  . citalopram (CELEXA) tablet 20 mg  20 mg Oral Daily Ursula Alert, MD   20 mg at 01/28/16 0814  . feeding supplement (ENSURE ENLIVE) (ENSURE ENLIVE) liquid 237 mL  237 mL Oral BID BM Dara Hoyer, PA-C   237 mL at 01/27/16 1500  . haloperidol (HALDOL) tablet 5 mg  5 mg Oral q morning - 10a Saramma Eappen, MD      . haloperidol (HALDOL) tablet 7.5 mg  7.5 mg Oral QHS Saramma Eappen, MD   7.5 mg at 01/27/16 2133  . hydrOXYzine (ATARAX/VISTARIL) tablet 25 mg  25 mg Oral Q4H PRN Dara Hoyer, PA-C   25 mg at 01/28/16 4332  . Influenza vac split quadrivalent PF (FLUARIX) injection 0.5 mL  0.5 mL Intramuscular Tomorrow-1000 Saramma Eappen, MD      . LORazepam (ATIVAN) tablet 0.5 mg  0.5 mg Oral QHS Saramma Eappen, MD   0.5 mg at 01/27/16 2134  . risperiDONE (RISPERDAL M-TABS) disintegrating tablet 2 mg  2 mg Oral Q8H PRN Ursula Alert, MD       And  . LORazepam (ATIVAN) tablet 1 mg  1 mg Oral PRN Ursula Alert, MD       And  . ziprasidone (GEODON) injection 20 mg  20 mg Intramuscular PRN Ursula Alert, MD      . magnesium hydroxide (MILK OF MAGNESIA) suspension 30 mL  30 mL Oral  Daily PRN Dara Hoyer, PA-C      . traZODone (DESYREL) tablet 50 mg  50 mg Oral QHS PRN Ursula Alert, MD   50 mg at 01/28/16 0212    PTA Medications: Prescriptions Prior to Admission  Medication Sig Dispense Refill Last Dose  . benztropine (COGENTIN) 0.5 MG tablet Take 0.5-1 mg by mouth 2 (two) times daily. 0.5 mg in the morning & 1 mg at bedtime   01/23/2016 at Unknown time  . haloperidol (HALDOL) 5 MG tablet Take 5 mg by mouth 2 (two) times daily.    01/23/2016 at Unknown time  . lisinopril (PRINIVIL,ZESTRIL) 5 MG tablet Take 5 mg by mouth daily.   01/23/2016 at Unknown time    Treatment Modalities: Medication Management, Group therapy, Case management,  1 to 1 session with clinician, Psychoeducation, Recreational therapy.   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  Therapeutic Interventions: 1 to 1 sessions, Unit Group sessions and Medication administration.  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  Therapeutic Interventions: 1 to 1 sessions, Unit Group sessions and Medication administration.  Evaluation of Outcomes: Progressing  9/14: Will increase Haldol to 5 mg po daily and 10 mg po qhs for psychosis. Pt did well on the same in the past. Will increase Cogentin to 2 mg po qhs and 0.5 mg po daily for eps. Will continue celexa  20  mg po daily for  affective sx. Will increase Ativan to 1 mg po qhs  for anxiety sx- ? Catatonia. Will discontinue Lisinopril due to low BP . Will monitor BP .   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 RN will report any adverse and/or side effects to prescribing provider.  Therapeutic Interventions: 1 on 1 counseling sessions, Psychoeducation, Medication administration, Evaluate responses to treatment, Monitor vital signs and CBGs as ordered, Perform/monitor CIWA, COWS, AIMS and Fall Risk screenings as ordered, Perform wound care treatments as ordered.  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  Short Term Goals: Engage patient in aftercare planning with referrals and resources  Therapeutic Interventions: Assess for all discharge needs, 1 to 1 time with Social worker, Explore available resources and support systems, Assess for adequacy in community support network, Educate family and significant other(s) on suicide prevention, Complete Psychosocial Assessment, Interpersonal group therapy.  Evaluation of Outcomes: Met    See below   Progress in Treatment: Attending groups: Yes Participating in groups: Yes Taking medication as prescribed: Yes Toleration medication: Yes, no side effects reported at this time Family/Significant other contact made: Yes mother Patient understands diagnosis: No   Limited insight Discussing patient identified problems/goals with staff: Yes Medical problems stabilized or resolved: Yes Denies suicidal/homicidal ideation: Yes Issues/concerns per patient self-inventory: None Other: N/A  New problem(s) identified: None identified at this time.   New Short Term/Long Term Goal(s): None identified at this time.   Discharge Plan or Barriers:  Return home, follow up outpt  Reason for Continuation of Hospitalization: Depression Hallucinations Medication stabilization   Estimated Length of Stay: 3-5 days  Attendees: Patient: 01/28/2016  8:55 AM  Physician: Ursula Alert, MD 01/28/2016  8:55 AM  Nursing: Hoy Register, RN 01/28/2016  8:55 AM  RN Care Manager: Lars Pinks, RN 01/28/2016  8:55 AM  Social Worker: Ripley Fraise 01/28/2016  8:55 AM  Recreational Therapist: Laretta Bolster  01/28/2016  8:55 AM  Other: Norberto Sorenson 01/28/2016  8:55 AM  Other:  01/28/2016  8:55 AM    Scribe for Treatment Team:  Roque Lias 01/28/2016 8:55 AM

## 2016-01-28 NOTE — Progress Notes (Signed)
[facility] MD Progress Note  01/28/2016 2:17 PM Isabella Isabella [name]  MRN:  [id number] Subjective: Patient states " I am fine , I do not have any problems.'    Objective:Isabella Valenciais a 40 y.o.female, who has a hx of schizophrenia , who is single , lives in [place], [place] who presented voluntarily with mother for increased catatonic sx .  Patient seen and chart reviewed.Discussed patient with treatment team. Pt today seen as awake, Isabella [name] . Answers to questions are limited - but linear. Pt however per staff has been having some episodes on the unit when she appears to require redirection, is very intrusive , going in to other people's room, as well as not sleeping at night. Continues to need encouragement and support.       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  Past Medical History: denies Family History: denies hx of Thyroid disease, HTN, DM. Family History  Problem Relation Age of Onset  . Mental illness Neg Hx    Family Psychiatric  History:denies Social History: Please see H&P.  History  Alcohol Use No     History  Drug Use No    Social History   Social History  . Marital status: Single    Spouse name: N/A  . Number of children: N/A  . Years of education: N/A   Social History Main Topics  . Smoking status: Never Smoker  . Smokeless tobacco: Never Used  . Alcohol use No  . Drug use: No  . Sexual activity: Not Asked   Other Topics Concern  . None   Social History Narrative  . None   Additional Social History:    Pain Medications: No abuse reported. Prescriptions: No abuse reported. Mom reports compliance with all medications. Over the Counter: No abuse reported. History of alcohol / drug use?: No history of  alcohol / drug abuse                    Sleep: restless  Appetite:  Poor  Current Medications: Current Facility-Administered Medications  Medication Dose Route Frequency Provider Last Rate Last Dose  . acetaminophen (TYLENOL) tablet 650 mg  650 mg Oral Q6H PRN Dara Hoyer, PA-C   650 mg at 01/27/16 0955  . alum & mag hydroxide-simeth (MAALOX/MYLANTA) 200-200-20 MG/5ML suspension 30 mL  30 mL Oral Q4H PRN Dara Hoyer, PA-C      . benztropine (COGENTIN) tablet 0.5 mg  0.5 mg Oral q morning - 10a Isabella Yandow, MD   0.5 mg at 01/28/16 1011  . benztropine (COGENTIN) tablet 1 mg  1 mg Oral QHS Isabella Alert, MD   1 mg at 01/27/16 2133  . citalopram (CELEXA) tablet 20 mg  20 mg Oral Daily Isabella Alert, MD   20 mg at 01/28/16 0814  . feeding supplement (ENSURE ENLIVE) (ENSURE ENLIVE) liquid 237 mL  237 mL Oral BID BM Dara Hoyer, PA-C   237 mL at 01/28/16 1008  . haloperidol (HALDOL) tablet 10 mg  10 mg Oral QHS Isabella Canelo, MD      . haloperidol (HALDOL) tablet 5 mg  5 mg Oral q morning - 10a Isabella Swim, MD   5 mg at 01/28/16 1010  . hydrOXYzine (ATARAX/VISTARIL) tablet 25 mg  25 mg  Oral Q4H PRN Dara Hoyer, PA-C   25 mg at 01/28/16 [id number]  . Influenza vac split quadrivalent PF (FLUARIX) injection 0.5 mL  0.5 mL Intramuscular Tomorrow-1000 Isabella Dishman, MD      . risperiDONE (RISPERDAL M-TABS) disintegrating tablet 2 mg  2 mg Oral Q8H PRN Isabella Alert, MD       And  . LORazepam (ATIVAN) tablet 1 mg  1 mg Oral PRN Isabella Alert, MD       And  . ziprasidone (GEODON) injection 20 mg  20 mg Intramuscular PRN Isabella Demarais, MD      . LORazepam (ATIVAN) tablet 1 mg  1 mg Oral QHS Isabella Qazi, MD      . magnesium hydroxide (MILK OF MAGNESIA) suspension 30 mL  30 mL Oral Daily PRN Dara Hoyer, PA-C      . traZODone (DESYREL) tablet 100 mg  100 mg Oral QHS Isabella Alert, MD        Lab Results:  No results found for this or any previous visit (from the  past 48 hour(s)).  Blood Alcohol level:  Lab Results  Component Value Date   ETH <5 [id number]    Metabolic Disorder Labs: No results found for: HGBA1C, MPG No results found for: PROLACTIN No results found for: CHOL, TRIG, HDL, CHOLHDL, VLDL, LDLCALC  Physical Findings: AIMS: Facial and Oral Movements Muscles of Facial Expression: None, normal Lips and Perioral Area: None, normal Jaw: None, normal Tongue: None, normal,Extremity Movements Upper (arms, wrists, hands, fingers): None, normal Lower (legs, knees, ankles, toes): None, normal, Trunk Movements Neck, shoulders, hips: None, normal, Overall Severity Severity of abnormal movements (highest score from questions above): None, normal Incapacitation due to abnormal movements: None, normal Patient's awareness of abnormal movements (rate only patient's report): No Awareness, Dental Status Current problems with teeth and/or dentures?: No Does patient usually wear dentures?: No  CIWA:    COWS:     Musculoskeletal: Strength & Muscle Tone: within normal limits Gait & Station: normal Patient leans: N/A  Psychiatric Specialty Exam: Physical Exam  Nursing note and vitals reviewed.   Review of Systems  Psychiatric/Behavioral: Positive for depression. The patient is nervous/anxious and has insomnia.   All other systems reviewed and are negative.   Blood pressure 95/61, pulse 79, temperature 98.4 F (36.9 C), temperature source Oral, resp. rate 20, height 5\' 4"  (1.626 m), weight 65.3 kg (144 lb), last menstrual period 01/03/2016, SpO2 99 %.Body mass index is 24.72 kg/m.  General Appearance: Guarded  Eye Contact:  Fair  Speech:  Slow  Volume:  Decreased  Mood:  Anxious and Dysphoric  Affect:  anxious  Thought Process:  Irrelevant and Descriptions of Associations: Circumstantial more relevant  Orientation:  Other:  person, situation  Thought Content:  Delusions, Hallucinations: Auditory, Paranoid Ideation and Rumination   Suicidal Thoughts:  denies , but has severe negative sx  Homicidal Thoughts:  No  Memory:  Immediate;   Fair Recent;   Poor Remote;   Poor  Judgement:  Impaired  Insight:  Shallow  Psychomotor Activity:  Decreased  Concentration:  Concentration: Poor and Attention Span: Poor  Recall:  Poor  Fund of Knowledge:  Poor  Language:  Fair  Akathisia:  No  Handed:  Right  AIMS (if indicated):     Assets:  Desire for Improvement  ADL's:  Impaired  Cognition:  WNL  Sleep:  Number of Hours: 3.25     Treatment Plan Summary:Isabella Valenciais a 40 y.o.female, who has  a hx of schizophrenia , who is single , lives in [place], [place] who presented voluntarily with mother for increased catatonic sx .  Pt seen as disorganized, thought blocking , negative sx, impaired ADLs, although progressing , continues to have sleep issues - will continue treatment and observe on the unit.  Daily contact with patient to assess and evaluate symptoms and progress in treatment and Medication management Reviewed past medical records,treatment plan.  Will increase Haldol to 5 mg po daily and 10 mg po qhs for psychosis. Pt did well on the same in the past. Will increase Cogentin to 2 mg po qhs and 0.5 mg po daily for eps. Will continue celexa  20  mg po daily for affective sx. Will increase Ativan to 1 mg po qhs  for anxiety sx- ? Catatonia. Will discontinue Lisinopril due to low BP . Will monitor BP . Will continue to monitor vitals ,medication compliance and treatment side effects while patient is here.  Will monitor for medical issues as well as call consult as needed.  Reviewed labs cbc - - hb - slightly low , cmp - wnl, UDS- negative , BAL <5  ,pending tsh, lipid panel, hba1c, pl as well as EKG for qtc. CSW will continue working on disposition.  Patient to participate in therapeutic milieu .      Delesia Martinek, MD 01/28/2016, 2:17 PM

## 2016-01-29 MED ORDER — LORAZEPAM 0.5 MG PO TABS
0.5000 mg | ORAL_TABLET | Freq: Every day | ORAL | Status: DC
  Administered 2016-01-29: 0.5 mg via ORAL
  Filled 2016-01-29: qty 1

## 2016-01-29 MED ORDER — TRAZODONE HCL 50 MG PO TABS
75.0000 mg | ORAL_TABLET | Freq: Every day | ORAL | Status: DC
  Administered 2016-01-29: 75 mg via ORAL
  Filled 2016-01-29 (×3): qty 1.5

## 2016-01-29 MED ORDER — HALOPERIDOL 5 MG PO TABS
7.5000 mg | ORAL_TABLET | Freq: Every day | ORAL | Status: DC
  Administered 2016-01-29: 7.5 mg via ORAL
  Filled 2016-01-29 (×3): qty 2

## 2016-01-29 NOTE — Progress Notes (Addendum)
D: Pt is flat, isolative and withdrawn to room-Pt remained in room all evening refusing to participate. Pt was observed in bed with eyes closed all evening. Pt endorsed moderate anxiety. Pt was also observed having a conversation with self; Pt denied SI. Pt remained calm and cooperative. A: Medications offered as prescribed.  Support, encouragement, and safe environment provided.  15-minute safety checks continue. R: Pt was med compliant.  Pt did not attended [place] group. Safety checks continue

## 2016-01-29 NOTE — Progress Notes (Signed)
DAR NOTE: Patient presents with depressed mood and affect. Denies pain, auditory and visual hallucinations.  Rates depression at 8, hopelessness at 8, and anxiety at 7.  Maintained on routine safety checks.  Medications given as prescribed.  Support and encouragement offered as needed. States goal for today is "get out of bed and eat breakfast."  Patient remained in bed most of this shift after several encouragement.  Appetite poor, fluid intake encouraged.

## 2016-01-29 NOTE — Progress Notes (Signed)
[facility] MD Progress Note  01/29/2016 3:27 PM Isabella [name]  MRN:  [id number] Subjective: Patient states " I am fine ."     Objective:Isabella Valenciais a 40 y.o.female, who has a hx of schizophrenia , who is single , lives in [place], [place] who presented voluntarily with mother for increased catatonic sx .  Patient seen and chart reviewed.Discussed patient with treatment team. Pt today seen as awake, alert . Spanish interpreter was made use of today for the assessment. Pt initially was seen withdrawn to self with eyes closed - however after several attempts was able to participate in the evaluation. Pt denies any new concerns. However per staff - has been sleeping a lot and was unable to talk to family last night when they visited. Discussed with pt that her haldol dose will be reduced today to address oversedation. Continues to need encouragement and support.       Principal Problem: Schizophrenia, catatonic type [facility]) Diagnosis:   Patient Active Problem List   Diagnosis Date Noted  . 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  Past Medical History: denies Family History: denies hx of Thyroid disease, HTN, DM. Family History  Problem Relation Age of Onset  . Mental illness Neg Hx    Family Psychiatric  History:denies Social History: Please see H&P.  History  Alcohol Use No     History  Drug Use No    Social History   Social History  . Marital status: Single    Spouse name: N/A  . Number of children: N/A  . Years of education: N/A   Social History Main Topics  . Smoking status: Never Smoker  . Smokeless tobacco: Never Used  . Alcohol use No  . Drug use: No  . Sexual activity: Not Asked   Other Topics Concern  . None   Social History Narrative  . None   Additional Social History:    Pain Medications: No abuse  reported. Prescriptions: No abuse reported. Mom reports compliance with all medications. Over the Counter: No abuse reported. History of alcohol / drug use?: No history of alcohol / drug abuse                    Sleep: Fair  Appetite:  Poor  Current Medications: Current Facility-Administered Medications  Medication Dose Route Frequency Provider Last Rate Last Dose  . acetaminophen (TYLENOL) tablet 650 mg  650 mg Oral Q6H PRN Dara Hoyer, PA-C   650 mg at 01/27/16 0955  . alum & mag hydroxide-simeth (MAALOX/MYLANTA) 200-200-20 MG/5ML suspension 30 mL  30 mL Oral Q4H PRN Dara Hoyer, PA-C      . benztropine (COGENTIN) tablet 0.5 mg  0.5 mg Oral q morning - 10a Faraz Ponciano, MD   0.5 mg at 01/29/16 1056  . benztropine (COGENTIN) tablet 2 mg  2 mg Oral QHS Ursula Alert, MD   2 mg at 01/28/16 2050  . citalopram (CELEXA) tablet 20 mg  20 mg Oral Daily Lucee Brissett, MD   20 mg at 01/29/16 1056  . feeding supplement (ENSURE ENLIVE) (ENSURE ENLIVE) liquid 237 mL  237 mL Oral BID BM Dara Hoyer, PA-C   237 mL at 01/29/16 1057  . haloperidol (HALDOL) tablet 5 mg  5 mg Oral q morning - 10a Shelbie Franken, MD   5 mg at 01/29/16 1056  . haloperidol (HALDOL) tablet  7.5 mg  7.5 mg Oral QHS Marcena Dias, MD      . hydrOXYzine (ATARAX/VISTARIL) tablet 25 mg  25 mg Oral Q4H PRN Dara Hoyer, PA-C   25 mg at 01/28/16 [id number]  . Influenza vac split quadrivalent PF (FLUARIX) injection 0.5 mL  0.5 mL Intramuscular Tomorrow-1000 Tija Biss, MD      . LORazepam (ATIVAN) tablet 0.5 mg  0.5 mg Oral QHS Ameer Sanden, MD      . risperiDONE (RISPERDAL M-TABS) disintegrating tablet 2 mg  2 mg Oral Q8H PRN Ursula Alert, MD       And  . LORazepam (ATIVAN) tablet 1 mg  1 mg Oral PRN Ursula Alert, MD       And  . ziprasidone (GEODON) injection 20 mg  20 mg Intramuscular PRN Ursula Alert, MD      . magnesium hydroxide (MILK OF MAGNESIA) suspension 30 mL  30 mL Oral Daily PRN Dara Hoyer, PA-C      . traZODone (DESYREL) tablet 75 mg  75 mg Oral QHS Ursula Alert, MD        Lab Results:  No results found for this or any previous visit (from the past 48 hour(s)).  Blood Alcohol level:  Lab Results  Component Value Date   ETH <5 [id number]    Metabolic Disorder Labs: No results found for: HGBA1C, MPG No results found for: PROLACTIN No results found for: CHOL, TRIG, HDL, CHOLHDL, VLDL, LDLCALC  Physical Findings: AIMS: Facial and Oral Movements Muscles of Facial Expression: None, normal Lips and Perioral Area: None, normal Jaw: None, normal Tongue: None, normal,Extremity Movements Upper (arms, wrists, hands, fingers): None, normal Lower (legs, knees, ankles, toes): None, normal, Trunk Movements Neck, shoulders, hips: None, normal, Overall Severity Severity of abnormal movements (highest score from questions above): None, normal Incapacitation due to abnormal movements: None, normal Patient's awareness of abnormal movements (rate only patient's report): No Awareness, Dental Status Current problems with teeth and/or dentures?: No Does patient usually wear dentures?: No  CIWA:    COWS:     Musculoskeletal: Strength & Muscle Tone: within normal limits Gait & Station: normal Patient leans: N/A  Psychiatric Specialty Exam: Physical Exam  Nursing note and vitals reviewed.   Review of Systems  Psychiatric/Behavioral: Positive for depression. The patient is nervous/anxious.   All other systems reviewed and are negative.   Blood pressure 107/68, pulse 76, temperature 98.4 F (36.9 C), temperature source Oral, resp. rate 16, height 5\' 4"  (1.626 m), weight 65.3 kg (144 lb), last menstrual period 01/03/2016, SpO2 99 %.Body mass index is 24.72 kg/m.  General Appearance: Guarded  Eye Contact:  Fair  Speech:  Slow  Volume:  Decreased  Mood:  Anxious and Dysphoric  Affect:  anxious  Thought Process:  Irrelevant and Descriptions of Associations:  Circumstantial more relevant  Orientation:  Other:  person, situation  Thought Content:  Delusions, Paranoid Ideation and Rumination  Suicidal Thoughts:  denies , but has severe negative sx  Homicidal Thoughts:  No  Memory:  Immediate;   Fair Recent;   Poor Remote;   Poor  Judgement:  Impaired  Insight:  Shallow  Psychomotor Activity:  Decreased  Concentration:  Concentration: Poor and Attention Span: Poor  Recall:  Poor  Fund of Knowledge:  Poor  Language:  Fair  Akathisia:  No  Handed:  Right  AIMS (if indicated):     Assets:  Desire for Improvement  ADL's:  Impaired  Cognition:  WNL  Sleep:  Number of Hours: 6.75     Treatment Plan Summary:Tamella Valenciais a 40 y.o.female, who has a hx of schizophrenia , who is single , lives in [place], [place] who presented voluntarily with mother for increased catatonic sx .  Pt seen as disorganized, thought blocking , negative sx, impaired ADLs, although progressing - will continue treatment and observe on the unit.Will reduce dose of medications scheduled due to over sedation.  Daily contact with patient to assess and evaluate symptoms and progress in treatment and Medication management Reviewed past medical records,treatment plan.  Will reduce Haldol to 5 mg po daily and 7.5 mg po qhs for psychosis. Pt did well on the same in the past. Will continue Cogentin  2 mg po qhs and 0.5 mg po daily for eps. Will continue celexa  20  mg po daily for affective sx. Will reduce Ativan to 0.5  mg po qhs  for anxiety sx- ? Catatonia. Will reduce Trazodone to 75 mg po qhs for sleep. Will discontinue Lisinopril due to low BP . Will monitor BP . Will continue to monitor vitals ,medication compliance and treatment side effects while patient is here.  Will monitor for medical issues as well as call consult as needed.  Reviewed labs cbc - - hb - slightly low , cmp - wnl, UDS- negative , BAL <5  ,pending tsh, lipid panel, hba1c, pl as well as EKG for  qtc. CSW will continue working on disposition.  Patient to participate in therapeutic milieu .      Josel Keo, MD 01/29/2016, 3:27 PM

## 2016-01-29 NOTE — Progress Notes (Signed)
Recreation Therapy Notes  Date: 01/29/16 Time: 1000 Location: 500 Hall Dayroom  Group Topic: Stress Management  Goal Area(s) Addresses:  Patient will verbalize importance of using healthy stress management.  Patient will identify positive emotions associated with healthy stress management.   Intervention: Deep Breathing, Guided Imagery  Activity :  Deep Breathing, Depression Imagery.  LRT introduced the stress management techniques of deep breathing and guided imagery.  LRT read scripts aloud to allow patients to guide patients through the techniques. Patients were to follow along as LRT read scripts to participate in the techniques.  Education: Stress Management, Discharge Planning.   Education Outcome: Needs additional education  Clinical Observations/Feedback: Pt did not attend group.   Victorino Sparrow, LRT/CTRS        Victorino Sparrow A 01/29/2016 12:30 PM

## 2016-01-29 NOTE — Progress Notes (Signed)
Patient ID: Isabella [name], female   DOB: 1975-06-13, 40 y.o.   MRN: [id number]  Adult Psychoeducational Group Note  Date:  01/29/2016 Time: 08:00pm   Group Topic/Focus:  Wrap-Up Group:   The focus of this group is to help patients review their daily goal of treatment and discuss progress on daily workbooks.   Participation Level:  Did Not Attend  Participation Quality: n/a  Affect:  n/a  Cognitive: n/a  Insight: n/a  Engagement in Group: n/a  Modes of Intervention:  Discussion, Orientation and Support  Additional Comments:  Pt in bed asleep. Choose not to attend group.  Elenore Rota 01/29/2016, 8:52 PM

## 2016-01-29 NOTE — BHH Group Notes (Signed)
[facility] LCSW Group Therapy  01/29/2016  1:05 PM  Type of Therapy:  Group therapy  Participation Level:  Active  Participation Quality:  Attentive  Affect:  Flat  Cognitive:  Oriented  Insight:  Limited  Engagement in Therapy:  Limited  Modes of Intervention:  Discussion, Socialization  Summary of Progress/Problems:  Chaplain was here to lead a group on themes of hope and courage. Invited.  Chose to not attend.  Isabella [name] 01/29/2016 1:20 PM

## 2016-01-30 MED ORDER — TRAZODONE HCL 50 MG PO TABS
50.0000 mg | ORAL_TABLET | Freq: Every evening | ORAL | Status: DC | PRN
Start: 2016-01-30 — End: 2016-02-03
  Administered 2016-01-30 – 2016-02-02 (×3): 50 mg via ORAL
  Filled 2016-01-30 (×3): qty 1

## 2016-01-30 MED ORDER — HALOPERIDOL 5 MG PO TABS
5.0000 mg | ORAL_TABLET | Freq: Two times a day (BID) | ORAL | Status: DC
  Administered 2016-01-30 – 2016-02-04 (×10): 5 mg via ORAL
  Filled 2016-01-30 (×12): qty 1

## 2016-01-30 MED ORDER — LORAZEPAM 0.5 MG PO TABS
0.5000 mg | ORAL_TABLET | Freq: Two times a day (BID) | ORAL | Status: DC
  Administered 2016-01-30 – 2016-02-01 (×4): 0.5 mg via ORAL
  Filled 2016-01-30 (×4): qty 1

## 2016-01-30 MED ORDER — BENZTROPINE MESYLATE 1 MG PO TABS
1.0000 mg | ORAL_TABLET | Freq: Every day | ORAL | Status: DC
  Administered 2016-01-30 – 2016-02-03 (×5): 1 mg via ORAL
  Filled 2016-01-30 (×7): qty 1

## 2016-01-30 NOTE — Progress Notes (Addendum)
Patient ID: Isabella [name], female   DOB: 10/10/1975, 40 y.o.   MRN: [id number]    D: Pt has been very flat and depressed today on the unit. Pt remained in her room most of the day, she did not attend any groups nor did she engage in any treatment. Pt was seen by Dr. Parke Poisson this morning who spoke spanish and was able to communicate with patient. Per Dr. Parke Poisson patient reported that she felt better and that she was not hearing any voices or seeing anything, nor did she want to hurt herself. The patient also had a interpreter present for groups, the interpreter reported that patient did not want to attend any groups nor did she want to go to lunch. Pt did take medications without any problems. Pt attempted to fill out her self inventory sheet, she put yes for depression and anxiety. She documented that her back was hurting but when asked by the interpreter she reported that she was fine.Pt reported being negative SI/HI, no AH/VH noted. A: 15 min checks continued for patient safety. R: Pt safety maintained.

## 2016-01-30 NOTE — Progress Notes (Signed)
D: Pt is flat, isolative and withdrawn to room-Pt remained in room all evening refusing to participate. Pt was observed in bed with eyes closed all evening. Pt endorsed moderate anxiety however states that she feels better. Pt denied SI. Pt remained calm and cooperative. A: Medications offered as prescribed.  Support, encouragement, and safe environment provided.  15-minute safety checks continue. R: Pt was med compliant.  Pt did not attend group. Safety checks continue

## 2016-01-30 NOTE — BHH Group Notes (Signed)
[facility] LCSW Group Therapy  01/30/2016 12:45 PM  Type of Therapy:  Group Therapy  Participation Level:  Did Not Attend   Modes of Intervention:  Discussion, Education, Socialization and Support  Summary of Progress/Problems: Balance in life: Patients will discuss the concept of balance and how it looks and feels to be unbalanced. Pt will identify areas in their life that is unbalanced and ways to become more balanced.   [place] MSW, [place]  01/30/2016, 12:45 PM

## 2016-01-30 NOTE — Progress Notes (Signed)
Did not attend groups 

## 2016-01-30 NOTE — Progress Notes (Signed)
[facility] MD Progress Note  01/30/2016 2:23 PM [name]  MRN:  [id number] Subjective: Patient reports she is feeling better than on admission, endorses feeling " tired", and does report some sadness. At this time denies suicidal ideations. Also denies hallucinations, and at present does not appear internally preoccupied .  Objective:Isabella Valenciais a 40 y.o.female, who has a hx of schizophrenia , who is single , lives in [place], [place] who presented voluntarily with mother for increased catatonic sx . Patient interviewed in [place] . At this time patient in bed, fair eye contact, presents with a blunted / constricted affect, but states she is feeling better . Denies suicidal ideations and does not endorse hallucinations, nor does she appear internally preoccupied . She has been isolative in her room, with limited milieu participation , keeps to self . No agitation or irritability noted at this time .  Principal Problem: Schizophrenia, catatonic type [facility]) Diagnosis:   Patient Active Problem List   Diagnosis Date Noted  . 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  Past Medical History: denies Family History: denies hx of Thyroid disease, HTN, DM. Family History  Problem Relation Age of Onset  . Mental illness Neg Hx    Family Psychiatric  History:denies Social History: Please see H&P.  History  Alcohol Use No     History  Drug Use No    Social History   Social History  . Marital status: Single    Spouse name: N/A  . Number of children: N/A  . Years of education: N/A   Social History Main Topics  . Smoking status: Never Smoker  . Smokeless tobacco: Never Used  . Alcohol use No  . Drug use: No  . Sexual activity: Not Asked   Other Topics Concern  . None   Social History Narrative  . None   Additional Social History:    Pain  Medications: No abuse reported. Prescriptions: No abuse reported. Mom reports compliance with all medications. Over the Counter: No abuse reported. History of alcohol / drug use?: No history of alcohol / drug abuse  Sleep: improved   Appetite:  Poor  Current Medications: Current Facility-Administered Medications  Medication Dose Route Frequency Provider Last Rate Last Dose  . acetaminophen (TYLENOL) tablet 650 mg  650 mg Oral Q6H PRN Dara Hoyer, PA-C   650 mg at 01/27/16 0955  . alum & mag hydroxide-simeth (MAALOX/MYLANTA) 200-200-20 MG/5ML suspension 30 mL  30 mL Oral Q4H PRN Dara Hoyer, PA-C      . benztropine (COGENTIN) tablet 0.5 mg  0.5 mg Oral q morning - 10a Ursula Alert, MD   0.5 mg at 01/30/16 0925  . benztropine (COGENTIN) tablet 2 mg  2 mg Oral QHS Ursula Alert, MD   2 mg at 01/29/16 2057  . citalopram (CELEXA) tablet 20 mg  20 mg Oral Daily Ursula Alert, MD   20 mg at 01/30/16 0924  . feeding supplement (ENSURE ENLIVE) (ENSURE ENLIVE) liquid 237 mL  237 mL Oral BID BM Dara Hoyer, PA-C   237 mL at 01/30/16 0925  . haloperidol (HALDOL) tablet 5 mg  5 mg Oral q morning - 10a Saramma Eappen, MD   5 mg at 01/30/16 0925  . haloperidol (HALDOL) tablet 7.5 mg  7.5 mg Oral QHS Ursula Alert, MD   7.5 mg at 01/29/16 2056  . hydrOXYzine (ATARAX/VISTARIL)  tablet 25 mg  25 mg Oral Q4H PRN Dara Hoyer, PA-C   25 mg at 01/28/16 [id number]  . Influenza vac split quadrivalent PF (FLUARIX) injection 0.5 mL  0.5 mL Intramuscular Tomorrow-1000 Saramma Eappen, MD      . LORazepam (ATIVAN) tablet 0.5 mg  0.5 mg Oral QHS Saramma Eappen, MD   0.5 mg at 01/29/16 2057  . risperiDONE (RISPERDAL M-TABS) disintegrating tablet 2 mg  2 mg Oral Q8H PRN Ursula Alert, MD       And  . LORazepam (ATIVAN) tablet 1 mg  1 mg Oral PRN Ursula Alert, MD       And  . ziprasidone (GEODON) injection 20 mg  20 mg Intramuscular PRN Ursula Alert, MD      . magnesium hydroxide (MILK OF MAGNESIA)  suspension 30 mL  30 mL Oral Daily PRN Dara Hoyer, PA-C      . traZODone (DESYREL) tablet 75 mg  75 mg Oral QHS Ursula Alert, MD   75 mg at 01/29/16 2056    Lab Results:  No results found for this or any previous visit (from the past 57 hour(s)).  Blood Alcohol level:  Lab Results  Component Value Date   ETH <5 [id number]    Metabolic Disorder Labs: No results found for: HGBA1C, MPG No results found for: PROLACTIN No results found for: CHOL, TRIG, HDL, CHOLHDL, VLDL, LDLCALC  Physical Findings: AIMS: Facial and Oral Movements Muscles of Facial Expression: None, normal Lips and Perioral Area: None, normal Jaw: None, normal Tongue: None, normal,Extremity Movements Upper (arms, wrists, hands, fingers): None, normal Lower (legs, knees, ankles, toes): None, normal, Trunk Movements Neck, shoulders, hips: None, normal, Overall Severity Severity of abnormal movements (highest score from questions above): None, normal Incapacitation due to abnormal movements: None, normal Patient's awareness of abnormal movements (rate only patient's report): No Awareness, Dental Status Current problems with teeth and/or dentures?: No Does patient usually wear dentures?: No  CIWA:    COWS:     Musculoskeletal: Strength & Muscle Tone: within normal limits Gait & Station: normal Patient leans: N/A  Psychiatric Specialty Exam: Physical Exam  Nursing note and vitals reviewed.   Review of Systems  Psychiatric/Behavioral: Positive for depression. The patient is nervous/anxious.   All other systems reviewed and are negative.   Blood pressure 114/72, pulse 96, temperature 98.7 F (37.1 C), temperature source Oral, resp. rate 16, height 5\' 4"  (1.626 m), weight 144 lb (65.3 kg), last menstrual period 01/03/2016, SpO2 99 %.Body mass index is 24.72 kg/m.  General Appearance: Fairly Groomed  Eye Contact:  Fair  Speech:  Slow  Volume:  Decreased  Mood:  Depressed  Affect:  Constricted   Thought Process:   Generally well organized   Orientation:   Alert and attentive   Thought Content:   Currently denies hallucinations, and does not appear internally preoccupied   Suicidal Thoughts:  No- at this time denies suicidal plan or intention , also denies violent or homicidal ideations   Homicidal Thoughts:  No  Memory:  Recent and remote fair   Judgement:  Fair  Insight:  Fair  Psychomotor Activity:  Decreased  Concentration:  Concentration: Fair and Attention Span: Fair  Recall:  [employer] of Knowledge:  Fair  Language:  Fair  Akathisia:  No  Handed:  Right  AIMS (if indicated):     Assets:  Desire for Improvement  ADL's:  Impaired  Cognition:  WNL  Sleep:  Number of Hours: 7  Assessment - patient reports partial improvement compared to admission - presents depressed, withdrawn, isolative, with limited speech. At this time not exhibiting positive psychotic symptoms .  She denies suicidal ideation st this time, She is tolerating medications well .  Daily contact with patient to assess and evaluate symptoms and progress in treatment and Medication management Reviewed past medical records,treatment plan.  Will reduce Haldol to 5 mg BID for psychosis. Rationale is to decrease possible side effects/sedation . Will reduce Cogentin to 0.5 mgrs QAM and 1 mgr QHS, as no EPS at current dose, and to decrease anticholinergic side effects during the day .  Will continue Celexa  20  mg QDAY  for affective sx. Will increase Ativan to 0.5 mgrs BID for catatonic symptoms, and anxiety  Will decrease Tazodone to 50 mgrs QHS PRN for insomnia as needed  CSW will continue working on disposition.  Patient encouraged to increase participation  in therapeutic milieu .      Neita Garnet, MD 01/30/2016, 2:23 PMPatient ID: Isabella [name], female   DOB: [birth year], 40 y.o.   MRN: [id number]

## 2016-01-31 LAB — LIPID PANEL
CHOLESTEROL: 152 mg/dL (ref 0–200)
HDL: 39 mg/dL — ABNORMAL LOW (ref >40)
LDL Cholesterol: 84 mg/dL (ref 0–99)
Total CHOL/HDL Ratio: 3.9 RATIO
Triglycerides: 147 mg/dL (ref <150)
VLDL: 29 mg/dL (ref 0–40)

## 2016-01-31 MED ORDER — VENLAFAXINE HCL ER 37.5 MG PO CP24
37.5000 mg | ORAL_CAPSULE | Freq: Every day | ORAL | Status: DC
  Administered 2016-02-01 – 2016-02-02 (×2): 37.5 mg via ORAL
  Filled 2016-01-31 (×3): qty 1

## 2016-01-31 NOTE — BHH Group Notes (Signed)
[facility] LCSW Group Therapy  01/31/2016 11:00 AM   Type of Therapy:  Group Therapy  Participation Level:  Did Not Attend  Theressa Millard, LCSW 01/31/2016 1:04 PM

## 2016-01-31 NOTE — Progress Notes (Signed)
Patient ID: Isabella [name], female   DOB: 06/10/1975, 40 y.o.   MRN: [id number]     D: Pt has been very flat and depressed today on the unit. Pt remained in her room most of the day, she did not attend any groups nor did she engage in any treatment.  The patient also had a interpreter present for groups, the interpreter reported that patient did not want to attend any groups nor did she want to go to lunch. Pt did take medications without any problems. Pt attempted to fill out her self inventory sheet, she put yes for depression and anxiety. She documented that her back was no longer hurting.Pt reported being negative SI/HI, no AH/VH noted. A: 15 min checks continued for patient safety. R: Pt safety maintained.

## 2016-01-31 NOTE — Progress Notes (Signed)
D: Pt is flat and withdrawn to self even while in dayroom-was observing reading in the dayroom. Pt as of the time of assessment endorses mild anxiety; she states, "I feel much better." Pt however, denied SI, HI, pain or AVH. Pt remained calm and cooperative. A: Medications offered as prescribed.  Support, encouragement, and safe environment provided.  15-minute safety checks continue. R: Pt was med compliant.  Pt attended wrap-up group. Safety checks continue

## 2016-01-31 NOTE — Progress Notes (Signed)
[facility] MD Progress Note  01/31/2016 2:22 PM Asusena Sigley  MRN:  [id number] Subjective: Patient reports " I am OK". She does endorse ongoing depression, but states she feels she is improving . Denies suicidal ideations. Denies medication side effects and feels medications are helping .  Objective: I have reviewed chart notes and have met with patient . She remains withdrawn, isolative, but more visible in day room, although with minimal interactions with peers .  Appears flat and depressed, but reports feeling better,and states she feels medications are helping . Does not endorse medication side effects at this time. Denies suicidal ideations . No disruptive or agitated behaviors . With patient's consent I have talked with her mother Jenetta Downer ) over the phone- mother expresses concern that patient continues to present quite depressed and states she thinks improvement has been modest thus far . She states that at baseline patient is much more active, verbal and sociable .  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  Past Medical History: denies Family History: denies hx of Thyroid disease, HTN, DM. Family History  Problem Relation Age of Onset  . Mental illness Neg Hx    Family Psychiatric  History:denies Social History: Please see H&P.  History  Alcohol Use No     History  Drug Use No    Social History   Social History  . Marital status: Single    Spouse name: N/A  . Number of children: N/A  . Years of education: N/A   Social History Main Topics  . Smoking status: Never Smoker  . Smokeless tobacco: Never Used  . Alcohol use No  . Drug use: No  . Sexual activity: Not Asked   Other Topics Concern  . None   Social  History Narrative  . None   Additional Social History:    Pain Medications: No abuse reported. Prescriptions: No abuse reported. Mom reports compliance with all medications. Over the Counter: No abuse reported. History of alcohol / drug use?: No history of alcohol / drug abuse  Sleep: improved   Appetite:  Poor  Current Medications: Current Facility-Administered Medications  Medication Dose Route Frequency Provider Last Rate Last Dose  . acetaminophen (TYLENOL) tablet 650 mg  650 mg Oral Q6H PRN Dara Hoyer, PA-C   650 mg at 01/27/16 0955  . alum & mag hydroxide-simeth (MAALOX/MYLANTA) 200-200-20 MG/5ML suspension 30 mL  30 mL Oral Q4H PRN Dara Hoyer, PA-C      . benztropine (COGENTIN) tablet 0.5 mg  0.5 mg Oral q morning - 10a Ursula Alert, MD   0.5 mg at 01/31/16 0834  . benztropine (COGENTIN) tablet 1 mg  1 mg Oral QHS Jenne Campus, MD   1 mg at 01/30/16 2049  . citalopram (CELEXA) tablet 20 mg  20 mg Oral Daily Ursula Alert, MD   20 mg at 01/31/16 0834  . feeding supplement (ENSURE ENLIVE) (ENSURE ENLIVE) liquid 237 mL  237 mL Oral BID BM Dara Hoyer, PA-C   237 mL at 01/31/16 0834  . haloperidol (HALDOL) tablet 5 mg  5 mg Oral BID Jenne Campus, MD   5 mg at 01/31/16 0834  . hydrOXYzine (ATARAX/VISTARIL) tablet 25 mg  25 mg Oral Q4H PRN Jessy Oto  Harold Hedge, PA-C   25 mg at 01/28/16 0347  . Influenza vac split quadrivalent PF (FLUARIX) injection 0.5 mL  0.5 mL Intramuscular Tomorrow-1000 Saramma Eappen, MD      . LORazepam (ATIVAN) tablet 0.5 mg  0.5 mg Oral BID Jenne Campus, MD   0.5 mg at 01/31/16 0834  . risperiDONE (RISPERDAL M-TABS) disintegrating tablet 2 mg  2 mg Oral Q8H PRN Ursula Alert, MD       And  . LORazepam (ATIVAN) tablet 1 mg  1 mg Oral PRN Ursula Alert, MD       And  . ziprasidone (GEODON) injection 20 mg  20 mg Intramuscular PRN Ursula Alert, MD      . magnesium hydroxide (MILK OF MAGNESIA) suspension 30 mL  30 mL Oral Daily PRN  Dara Hoyer, PA-C      . traZODone (DESYREL) tablet 50 mg  50 mg Oral QHS PRN Jenne Campus, MD   50 mg at 01/30/16 2049    Lab Results:  Results for orders placed or performed during the hospital encounter of 01/24/16 (from the past 48 hour(s))  Lipid panel     Status: Abnormal   Collection Time: 01/31/16  6:28 AM  Result Value Ref Range   Cholesterol 152 0 - 200 mg/dL   Triglycerides 147 <150 mg/dL   HDL 39 (L) >40 mg/dL   Total CHOL/HDL Ratio 3.9 RATIO   VLDL 29 0 - 40 mg/dL   LDL Cholesterol 84 0 - 99 mg/dL    Comment:        Total Cholesterol/HDL:CHD Risk Coronary Heart Disease Risk Table                     Men   Women  1/2 Average Risk   3.4   3.3  Average Risk       5.0   4.4  2 X Average Risk   9.6   7.1  3 X Average Risk  23.4   11.0        Use the calculated Patient Ratio above and the CHD Risk Table to determine the patient's CHD Risk.        ATP III CLASSIFICATION (LDL):  <100     mg/dL   Optimal  100-129  mg/dL   Near or Above                    Optimal  130-159  mg/dL   Borderline  160-189  mg/dL   High  >190     mg/dL   Very High Performed at [facility]     Blood Alcohol level:  Lab Results  Component Value Date   [facility] <5 42/59/5638    Metabolic Disorder Labs: No results found for: HGBA1C, MPG No results found for: PROLACTIN Lab Results  Component Value Date   CHOL 152 01/31/2016   TRIG 147 01/31/2016   HDL 39 (L) 01/31/2016   CHOLHDL 3.9 01/31/2016   VLDL 29 01/31/2016   LDLCALC 84 01/31/2016    Physical Findings: AIMS: Facial and Oral Movements Muscles of Facial Expression: None, normal Lips and Perioral Area: None, normal Jaw: None, normal Tongue: None, normal,Extremity Movements Upper (arms, wrists, hands, fingers): None, normal Lower (legs, knees, ankles, toes): None, normal, Trunk Movements Neck, shoulders, hips: None, normal, Overall Severity Severity of abnormal movements (highest score from questions above):  None, normal Incapacitation due to abnormal movements: None, normal Patient's awareness of abnormal movements (  rate only patient's report): No Awareness, Dental Status Current problems with teeth and/or dentures?: No Does patient usually wear dentures?: No  CIWA:    COWS:     Musculoskeletal: Strength & Muscle Tone: within normal limits Gait & Station: normal Patient leans: N/A  Psychiatric Specialty Exam: Physical Exam  Nursing note and vitals reviewed.   Review of Systems  Psychiatric/Behavioral: Positive for depression. The patient is nervous/anxious.   All other systems reviewed and are negative.   Blood pressure 101/65, pulse (!) 103, temperature 97.9 F (36.6 C), temperature source Oral, resp. rate 20, height 5' 4"  (1.626 m), weight 144 lb (65.3 kg), last menstrual period 01/03/2016, SpO2 99 %.Body mass index is 24.72 kg/m.  General Appearance: Fairly Groomed  Eye Contact:  Fair  Speech:  Slow  Volume:  Decreased  Mood:  Remains sad and depressed   Affect:  Constricted  Thought Process:   Linear   Orientation:   Alert and attentive   Thought Content:   Currently denies hallucinations, and does not appear internally preoccupied   Suicidal Thoughts:  No- at this time denies suicidal plan or intention , also denies violent or homicidal ideations   Homicidal Thoughts:  No  Memory:  Recent and remote fair   Judgement:  Fair  Insight:  Fair  Psychomotor Activity:  Decreased  Concentration:  Concentration: Fair and Attention Span: Fair  Recall:  [employer] of Knowledge:  Fair  Language:  Fair  Akathisia:  No  Handed:  Right  AIMS (if indicated):     Assets:  Desire for Improvement  ADL's:  Impaired  Cognition:  WNL  Sleep:  Number of Hours: 6.75     Assessment - patient continues to present sad, depressed, isolative . She denies suicidal ideations . She is not presenting with any overt psychotic symptoms, and denies hallucinations . Mother, with whom I have  talked via phone, expresses concern that improvement has been modest thus far .  She is tolerating medications well - no side effects- reportedly had been on Haldol prior to admission   Daily contact with patient to assess and evaluate symptoms and progress in treatment and Medication management Reviewed past medical records,treatment plan.  Continue Haldol  5 mg BID for psychosis.  Continue  Cogentin to 0.5 mgrs QAM and 1 mgr QHS for EPS prevention  D/C Celexa  As patient remains severely depressed . Start Effexor XR 37.5 mgrs QDAY for depression, anxiety  Continue Ativan  0.5 mgrs BID for  anxiety  Continue Tazodone  50 mgrs QHS PRN for insomnia as needed  CSW will continue working on disposition.  Patient encouraged to increase participation  in therapeutic milieu .      Neita Garnet, MD 01/31/2016, 2:22 PM

## 2016-01-31 NOTE — Progress Notes (Signed)
D    Pt is depressed and sad    She isolates to her room and has very little interaction with others    She does not attend groups    She did take her medications and otherwise compliant with treatment A    Verbal support given   Medications administered and effectiveness monitored    Q 15 min checks   R  Pt is safe at present

## 2016-02-01 LAB — PROLACTIN: Prolactin: 124.2 ng/mL — ABNORMAL HIGH (ref 4.8–23.3)

## 2016-02-01 LAB — HEMOGLOBIN A1C
HEMOGLOBIN A1C: 5.4 % (ref 4.8–5.6)
MEAN PLASMA GLUCOSE: 108 mg/dL

## 2016-02-01 MED ORDER — LORAZEPAM 0.5 MG PO TABS
0.5000 mg | ORAL_TABLET | Freq: Every day | ORAL | Status: DC
  Administered 2016-02-01 – 2016-02-02 (×2): 0.5 mg via ORAL
  Filled 2016-02-01 (×2): qty 1

## 2016-02-01 NOTE — Progress Notes (Signed)
Patient has been in his room for the majority of the shift.  Patient was able to get OOB and complete hygiene needs.  Patient stated that when she was home that she was often afraid, but she is feeling better now.    Assess patient for safety, offer medications as prescribed, engage patient in 1:1 staff talks.   Patient able to contract for safety.

## 2016-02-01 NOTE — Progress Notes (Signed)
[facility] MD Progress Note  02/01/2016 12:58 PM Jarrah Seher  MRN:  [id number] Subjective: Patient reports " I am fine."  Objective::Dawnmarie Valenciais a 40 y.o.female, who has a hx of schizophrenia , who is single , lives in [place], [place] who presented voluntarily with mother for increased catatonic sx .   I have reviewed chart notes and have met with patient . Patient seen as more visible on the unit , although she remains withdrawn and isolative most of the day. Pt has been making an attempt to participate in some groups - but has been very anxious in social settings and hence unable to do so. Per previous notes in [place] - " With patient's consent I have talked with her mother Jenetta Downer ) over the phone- mother expresses concern that patient continues to present quite depressed and states she thinks improvement has been modest thus far . She states that at baseline patient is much more active, verbal and sociable ."  Per reports from staff on the unit - pt remains withdrawn mostly with some progress - she does have some trouble talking to her mother when she visits , however was much more open with her father and other family members.  Pt has been compliant on her medications - does report some dizziness on and off. Overall - appetite and ADLs seems to be improving . Will continue to encourage and support.   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  Past Medical History: denies Family History: denies hx of Thyroid disease, HTN, DM. Family History  Problem Relation Age of Onset  . Mental illness Neg Hx    Family Psychiatric  History:denies Social History: Please see H&P.  History  Alcohol Use No     History  Drug Use  No    Social History   Social History  . Marital status: Single    Spouse name: N/A  . Number of children: N/A  . Years of education: N/A   Social History Main Topics  . Smoking status: Never Smoker  . Smokeless tobacco: Never Used  . Alcohol use No  . Drug use: No  . Sexual activity: Not Asked   Other Topics Concern  . None   Social History Narrative  . None   Additional Social History:    Pain Medications: No abuse reported. Prescriptions: No abuse reported. Mom reports compliance with all medications. Over the Counter: No abuse reported. History of alcohol / drug use?: No history of alcohol / drug abuse  Sleep: improved   Appetite:  improving  Current Medications: Current Facility-Administered Medications  Medication Dose Route Frequency Provider Last Rate Last Dose  . acetaminophen (TYLENOL) tablet 650 mg  650 mg Oral Q6H PRN Dara Hoyer, PA-C   650 mg at 01/27/16 0955  . alum & mag hydroxide-simeth (MAALOX/MYLANTA) 200-200-20 MG/5ML suspension 30 mL  30 mL Oral Q4H PRN Dara Hoyer, PA-C      . benztropine (COGENTIN) tablet 0.5 mg  0.5 mg Oral q morning - 10a Ursula Alert, MD   0.5 mg at 01/31/16 0834  . benztropine (COGENTIN) tablet 1 mg  1 mg Oral QHS Jenne Campus, MD   1 mg at 01/31/16 2121  . feeding supplement (ENSURE  ENLIVE) (ENSURE ENLIVE) liquid 237 mL  237 mL Oral BID BM Court Joy, PA-C   237 mL at 02/01/16 0843  . haloperidol (HALDOL) tablet 5 mg  5 mg Oral BID Craige Cotta, MD   5 mg at 02/01/16 0842  . hydrOXYzine (ATARAX/VISTARIL) tablet 25 mg  25 mg Oral Q4H PRN Court Joy, PA-C   25 mg at 01/28/16 4536  . Influenza vac split quadrivalent PF (FLUARIX) injection 0.5 mL  0.5 mL Intramuscular Tomorrow-1000 Karmyn Lowman, MD      . LORazepam (ATIVAN) tablet 0.5 mg  0.5 mg Oral BID Craige Cotta, MD   0.5 mg at 02/01/16 0842  . risperiDONE (RISPERDAL M-TABS) disintegrating tablet 2 mg  2 mg Oral Q8H PRN Jomarie Longs, MD        And  . LORazepam (ATIVAN) tablet 1 mg  1 mg Oral PRN Jomarie Longs, MD       And  . ziprasidone (GEODON) injection 20 mg  20 mg Intramuscular PRN Jomarie Longs, MD      . magnesium hydroxide (MILK OF MAGNESIA) suspension 30 mL  30 mL Oral Daily PRN Court Joy, PA-C      . traZODone (DESYREL) tablet 50 mg  50 mg Oral QHS PRN Craige Cotta, MD   50 mg at 01/31/16 2121  . venlafaxine XR (EFFEXOR-XR) 24 hr capsule 37.5 mg  37.5 mg Oral Q breakfast Craige Cotta, MD   37.5 mg at 02/01/16 4680    Lab Results:  Results for orders placed or performed during the hospital encounter of 01/24/16 (from the past 48 hour(s))  Hemoglobin A1c     Status: None   Collection Time: 01/31/16  6:28 AM  Result Value Ref Range   Hgb A1c MFr Bld 5.4 4.8 - 5.6 %    Comment: (NOTE)         Pre-diabetes: 5.7 - 6.4         Diabetes: >6.4         Glycemic control for adults with diabetes: <7.0    Mean Plasma Glucose 108 mg/dL    Comment: (NOTE) Performed At: [facility] [address] [place], [place] [id number] Mila Homer MD OI:[id number] Performed at [facility]   Lipid panel     Status: Abnormal   Collection Time: 01/31/16  6:28 AM  Result Value Ref Range   Cholesterol 152 0 - 200 mg/dL   Triglycerides 945 <038 mg/dL   HDL 39 (L) >88 mg/dL   Total CHOL/HDL Ratio 3.9 RATIO   VLDL 29 0 - 40 mg/dL   LDL Cholesterol 84 0 - 99 mg/dL    Comment:        Total Cholesterol/HDL:CHD Risk Coronary Heart Disease Risk Table                     Men   Women  1/2 Average Risk   3.4   3.3  Average Risk       5.0   4.4  2 X Average Risk   9.6   7.1  3 X Average Risk  23.4   11.0        Use the calculated Patient Ratio above and the CHD Risk Table to determine the patient's CHD Risk.        ATP III CLASSIFICATION (LDL):  <100     mg/dL   Optimal  280-034  mg/dL   Near or Above  Optimal  130-159  mg/dL   Borderline  160-189  mg/dL   High  >190      mg/dL   Very High Performed at [facility]     Blood Alcohol level:  Lab Results  Component Value Date   [facility] <5 17/49/4496    Metabolic Disorder Labs: Lab Results  Component Value Date   HGBA1C 5.4 01/31/2016   MPG 108 01/31/2016   No results found for: PROLACTIN Lab Results  Component Value Date   CHOL 152 01/31/2016   TRIG 147 01/31/2016   HDL 39 (L) 01/31/2016   CHOLHDL 3.9 01/31/2016   VLDL 29 01/31/2016   LDLCALC 84 01/31/2016    Physical Findings: AIMS: Facial and Oral Movements Muscles of Facial Expression: None, normal Lips and Perioral Area: None, normal Jaw: None, normal Tongue: None, normal,Extremity Movements Upper (arms, wrists, hands, fingers): None, normal Lower (legs, knees, ankles, toes): None, normal, Trunk Movements Neck, shoulders, hips: None, normal, Overall Severity Severity of abnormal movements (highest score from questions above): None, normal Incapacitation due to abnormal movements: None, normal Patient's awareness of abnormal movements (rate only patient's report): No Awareness, Dental Status Current problems with teeth and/or dentures?: No Does patient usually wear dentures?: No  CIWA:    COWS:     Musculoskeletal: Strength & Muscle Tone: within normal limits Gait & Station: normal Patient leans: N/A  Psychiatric Specialty Exam: Physical Exam  Nursing note and vitals reviewed.   Review of Systems  Psychiatric/Behavioral: Positive for depression. The patient is nervous/anxious.   All other systems reviewed and are negative.   Blood pressure 101/65, pulse (!) 103, temperature 97.9 F (36.6 C), temperature source Oral, resp. rate 20, height _0  (1.626 m), weight 65.3 kg (144 lb), last menstrual period 01/03/2016, SpO2 99 %.Body mass index is 24.72 kg/m.  General Appearance: Fairly Groomed  Eye Contact:  Fair  Speech:  Slow  Volume:  Decreased  Mood:  Remains sad and depressed   Affect:  Congruent  Thought  Process:   Linear   Orientation:   Alert and attentive   Thought Content:   Currently denies hallucinations, and does not appear internally preoccupied   Suicidal Thoughts:  No- at this time denies suicidal plan or intention , also denies violent or homicidal ideations   Homicidal Thoughts:  No  Memory:  Recent and remote fair , immediate - fair  Judgement:  Fair  Insight:  Fair  Psychomotor Activity:  Decreased  Concentration:  Concentration: Fair and Attention Span: Fair  Recall:  [employer] of Knowledge:  Fair  Language:  Fair  Akathisia:  No  Handed:  Right  AIMS (if indicated):     Assets:  Desire for Improvement  ADL's:  Impaired  Cognition:  WNL  Sleep:  Number of Hours: 6.25     Assessment -:Morene Valenciais a 40 y.o.female, who has a hx of schizophrenia , who is single , lives in [place], [place] who presented voluntarily with mother for increased catatonic sx .Overall - making progress - will continue to treat.  Daily contact with patient to assess and evaluate symptoms and progress in treatment and Medication management Will   continue Haldol to 5 mg po bid for psychosis. Pt did well on the same in the past. Will continue Cogentin    1 mg po qhs and 0.5 mg po daily for eps. Will continue Effexor XR 37.5 mg po daily for affective sx. Will reduce Ativan to 0.5  mg  po qhs  for anxiety sx- ? Catatonia. Will continue Trazodone  50 mg po qhs for sleep. Will discontinue Lisinopril due to low BP . Will monitor BP . Will continue to monitor vitals ,medication compliance and treatment side effects while patient is here.  Will monitor for medical issues as well as call consult as needed.  Reviewed labs cbc - - hb - slightly low , cmp - wnl, UDS- negative , BAL <5  , lipid panel- wnl ,hba1c- wnl ,pending  pl as well as EKG for qtc- wnl . CSW will continue working on disposition.  Patient to participate in therapeutic milieu .  Odessie Polzin, MD 02/01/2016, 12:58 PM

## 2016-02-01 NOTE — Clinical Social Work Note (Signed)
SPanish interpreter ordered for tomorrow 8:30 - noon and 1 - 3.  Jamas Lav will be interpreter.  Edwyna Shell, LCSW Lead Clinical Social Worker Phone:  [phone]

## 2016-02-01 NOTE — Progress Notes (Signed)
Recreation Therapy Notes  Date: 02/01/16 Time: 1000 Location: 500 Hall Dayroom  Group Topic: Team Building  Goal Area(s) Addresses:  Patient will effectively work with peers in group.  Patient will verbalize benefit of team building. Patient will verbalize positive effect of healthy team building on post d/c goals.    Intervention: Chairs, [employer]  Activity: [employer].  LRT arranged chairs in a circle.  Patients were to sit in the circle and toss the beach ball to each other as many times as possible without the ball coming to a stop.  The ball was allowed to bounce off of the ground but it could not roll to a stop.  It the ball came to a stop, the count would start over.  Education:Team Building, Discharge Planning  Education Outcome: Needs additional education.   Clinical Observations/Feedback: Pt did not attend group.   Victorino Sparrow, LRT/CTRS     Victorino Sparrow A 02/01/2016 12:40 PM

## 2016-02-01 NOTE — BHH Group Notes (Signed)
[facility] LCSW Aftercare Discharge Planning Group Note   02/01/2016 5:44 PM  Participation Quality:  Invited, did not attend   Isabella [name]

## 2016-02-01 NOTE — Progress Notes (Signed)
Patient did not attend warp-up group.

## 2016-02-01 NOTE — BHH Group Notes (Signed)
[place] [facility] LCSW Group Therapy  02/01/2016 3:01 PM  Type of Therapy:  Group Therapy    Summary of Progress/Problems: Did not attend  Lilly [place] 02/01/2016, 3:01 PM

## 2016-02-02 MED ORDER — VENLAFAXINE HCL ER 75 MG PO CP24
75.0000 mg | ORAL_CAPSULE | Freq: Every day | ORAL | Status: DC
  Administered 2016-02-03 – 2016-02-05 (×3): 75 mg via ORAL
  Filled 2016-02-02 (×4): qty 1

## 2016-02-02 NOTE — Progress Notes (Signed)
D: Isabella [name] speaks some English however interpreter was present to translate for patient and family (mother and aunt). She is still minimally interactive. Isolating herself in room and not participating in groups. Her mother states Isabella [name]'s entire behavior changed for the worse 13 days prior to her being admitted. She stopped bathing and taking care of herself. Mother is unsure if she had been missing any doses of medication however she stated after [name] decline she began to give Isabella [name] the medications herself. Isabella [name] did endorse taking a bath today and that she does not like going to the cafeteria and being around lots of people. Denies SI/HI/AVH at this time. Contracts for safety.  A: Encouragement and support given. Q15 minute room checks for patient safety. Medications administered as prescribed.  R: Continue to monitor for patient safety and medication effectiveness.

## 2016-02-02 NOTE — Progress Notes (Signed)
Recreation Therapy Notes  Date: 02/02/16 Time: 1000 Location: 500 Hall Dayroom  Group Topic: Self-Esteem  Goal Area(s) Addresses:  Patient will identify positive ways to increase self-esteem. Patient will verbalize benefit of increased self-esteem.  Intervention: Social worker, Architect paper  Activity: Personalized License Plate.  LRT gave each person a piece of construction paper.  Patients were to use colored pencils to draw their license plate and highlight the things that are important to them and what makes them unique.  Education:  Self-Esteem, Dentist.   Education Outcome: Needs additional education  Clinical Observations/Feedback: Pt did not attend group.    Victorino Sparrow, LRT/CTRS         Victorino Sparrow A 02/02/2016 11:37 AM

## 2016-02-02 NOTE — Progress Notes (Signed)
Patient has been in his room for the majority of the shift.  Patient was able to get OOB and complete hygiene needs.  Patient stated that when she was home that she was often afraid, but she is feeling better now Patient was able to .    Assess patient for safety, offer medications as prescribed, engage patient in 1:1 staff talks.   Patient able to contract for safety. Continue to monitor as prescribed.

## 2016-02-02 NOTE — Progress Notes (Signed)
[facility] MD Progress Note  02/02/2016 11:45 AM Undrea Archbold  MRN:  [id number] Subjective: Patient reports " I am fine."  Objective::Adalei Valenciais a 40 y.o.female, who has a hx of schizophrenia , who is single , lives in [place], [place] who presented voluntarily with mother for increased catatonic sx .   I have reviewed chart notes and have met with patient . Patient seen today I bed , appears less depressed . Response to questions are in monosyllables . Per staff - pt is taking showers with encouragement , taking care of her ADLs, taking meals . Pt does have social anxiety and does not want to be in groups - however has been making an attempt to participate . Will continue to encourage and support.   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  Past Medical History: denies Family History: denies hx of Thyroid disease, HTN, DM. Family History  Problem Relation Age of Onset  . Mental illness Neg Hx    Family Psychiatric  History:denies Social History: Please see H&P.  History  Alcohol Use No     History  Drug Use No    Social History   Social History  . Marital status: Single    Spouse name: N/A  . Number of children: N/A  . Years of education: N/A   Social History Main Topics  . Smoking status: Never Smoker  . Smokeless tobacco: Never Used  . Alcohol use No  . Drug use: No  . Sexual activity: Not Asked   Other Topics Concern  . None   Social History Narrative  . None   Additional Social History:    Pain Medications: No abuse reported. Prescriptions: No abuse reported. Mom reports compliance with all medications. Over the Counter: No abuse reported. History of alcohol / drug use?: No history of alcohol / drug  abuse  Sleep: improved   Appetite:  improving  Current Medications: Current Facility-Administered Medications  Medication Dose Route Frequency Provider Last Rate Last Dose  . acetaminophen (TYLENOL) tablet 650 mg  650 mg Oral Q6H PRN Dara Hoyer, PA-C   650 mg at 01/27/16 0955  . alum & mag hydroxide-simeth (MAALOX/MYLANTA) 200-200-20 MG/5ML suspension 30 mL  30 mL Oral Q4H PRN Dara Hoyer, PA-C      . benztropine (COGENTIN) tablet 0.5 mg  0.5 mg Oral q morning - 10a Temprance Wyre, MD   0.5 mg at 02/01/16 1731  . benztropine (COGENTIN) tablet 1 mg  1 mg Oral QHS Jenne Campus, MD   1 mg at 02/01/16 2153  . feeding supplement (ENSURE ENLIVE) (ENSURE ENLIVE) liquid 237 mL  237 mL Oral BID BM Dara Hoyer, PA-C   237 mL at 02/01/16 1439  . haloperidol (HALDOL) tablet 5 mg  5 mg Oral BID Jenne Campus, MD   5 mg at 02/02/16 4742  . hydrOXYzine (ATARAX/VISTARIL) tablet 25 mg  25 mg Oral Q4H PRN Dara Hoyer, PA-C   25 mg at 01/28/16 5956  . Influenza vac split quadrivalent PF (FLUARIX) injection 0.5 mL  0.5 mL Intramuscular Tomorrow-1000 Braelynn Benning, MD      . LORazepam (ATIVAN) tablet 0.5 mg  0.5 mg Oral QHS Fahad Cisse, MD   0.5 mg at 02/01/16 2153  .  risperiDONE (RISPERDAL M-TABS) disintegrating tablet 2 mg  2 mg Oral Q8H PRN Ursula Alert, MD       And  . LORazepam (ATIVAN) tablet 1 mg  1 mg Oral PRN Ursula Alert, MD       And  . ziprasidone (GEODON) injection 20 mg  20 mg Intramuscular PRN Ursula Alert, MD      . magnesium hydroxide (MILK OF MAGNESIA) suspension 30 mL  30 mL Oral Daily PRN Dara Hoyer, PA-C      . traZODone (DESYREL) tablet 50 mg  50 mg Oral QHS PRN Jenne Campus, MD   50 mg at 01/31/16 2121  . [START ON 02/03/2016] venlafaxine XR (EFFEXOR-XR) 24 hr capsule 75 mg  75 mg Oral Q breakfast Ursula Alert, MD        Lab Results:  No results found for this or any previous visit (from the past 48 hour(s)).  Blood Alcohol level:  Lab  Results  Component Value Date   ETH <5 03/47/4259    Metabolic Disorder Labs: Lab Results  Component Value Date   HGBA1C 5.4 01/31/2016   MPG 108 01/31/2016   Lab Results  Component Value Date   PROLACTIN 124.2 (H) 01/31/2016   Lab Results  Component Value Date   CHOL 152 01/31/2016   TRIG 147 01/31/2016   HDL 39 (L) 01/31/2016   CHOLHDL 3.9 01/31/2016   VLDL 29 01/31/2016   LDLCALC 84 01/31/2016    Physical Findings: AIMS: Facial and Oral Movements Muscles of Facial Expression: None, normal Lips and Perioral Area: None, normal Jaw: None, normal Tongue: None, normal,Extremity Movements Upper (arms, wrists, hands, fingers): None, normal Lower (legs, knees, ankles, toes): None, normal, Trunk Movements Neck, shoulders, hips: None, normal, Overall Severity Severity of abnormal movements (highest score from questions above): None, normal Incapacitation due to abnormal movements: None, normal Patient's awareness of abnormal movements (rate only patient's report): No Awareness, Dental Status Current problems with teeth and/or dentures?: No Does patient usually wear dentures?: No  CIWA:    COWS:     Musculoskeletal: Strength & Muscle Tone: within normal limits Gait & Station: normal Patient leans: N/A  Psychiatric Specialty Exam: Physical Exam  Nursing note and vitals reviewed.   Review of Systems  Psychiatric/Behavioral: Positive for depression. The patient is nervous/anxious.   All other systems reviewed and are negative.   Blood pressure 97/65, pulse 81, temperature 98.3 F (36.8 C), temperature source Oral, resp. rate 18, height _0  (1.626 m), weight 65.3 kg (144 lb), last menstrual period 01/03/2016, SpO2 99 %.Body mass index is 24.72 kg/m.  General Appearance: Fairly Groomed  Eye Contact:  Fair  Speech:  Slow  Volume:  Decreased  Mood:  depressed - improving  Affect:  Congruent  Thought Process:   Linear   Orientation:   Alert and attentive   Thought  Content:   Currently denies hallucinations, and does not appear internally preoccupied   Suicidal Thoughts:  No- at this time denies suicidal plan or intention , also denies violent or homicidal ideations   Homicidal Thoughts:  No  Memory:  Recent and remote fair , immediate - fair  Judgement:  Fair  Insight:  Fair  Psychomotor Activity:  Decreased  Concentration:  Concentration: Fair and Attention Span: Fair  Recall:  [employer] of Knowledge:  Fair  Language:  Fair  Akathisia:  No  Handed:  Right  AIMS (if indicated):     Assets:  Desire for Improvement  ADL's:  Impaired  Cognition:  WNL  Sleep:  Number of Hours: 6.25     Assessment -:Johnnye Valenciais a 40 y.o.female, who has a hx of schizophrenia , who is single , lives in [place], [place] who presented voluntarily with mother for increased catatonic sx .Overall - making progress - will continue to treat.  Daily contact with patient to assess and evaluate symptoms and progress in treatment and Medication management Will   continue Haldol  5 mg po bid for psychosis. Pt did well on the same in the past. Will continue Cogentin    1 mg po qhs and 0.5 mg po daily for eps. Will increase Effexor XR to 75 mg po daily for affective sx. Will continue Ativan to 0.5  mg po qhs  for anxiety sx- ? Catatonia. Will continue Trazodone  50 mg po qhs for sleep. Will discontinue Lisinopril due to low BP . Will monitor BP . Will continue to monitor vitals ,medication compliance and treatment side effects while patient is here.  Will monitor for medical issues as well as call consult as needed.  Reviewed labs cbc - - hb - slightly low , cmp - wnl, UDS- negative , BAL <5  , lipid panel- wnl ,hba1c- wnl , pl - 124 - elevated - will need to be monitored , as well as EKG for qtc- wnl . CSW will continue working on disposition.  Patient to participate in therapeutic milieu .  Broxton Broady, MD 02/02/2016, 11:45 AM

## 2016-02-02 NOTE — BHH Group Notes (Signed)
[place] LCSW Group Therapy  02/02/2016 3:30 PM   Type of Therapy:  Group Therapy  Participation Level:  Active  Participation Quality:  Attentive  Affect:  Appropriate  Cognitive:  Appropriate  Insight:  Improving  Engagement in Therapy:  Engaged  Modes of Intervention:  Clarification, Education, Exploration and Socialization  Summary of Progress/Problems: Today's group focused on relapse prevention.  We defined the term, and then brainstormed on ways to prevent relapse.  Invited.  Chose to not attend.  Isabella [name] B 02/02/2016 , 3:30 PM

## 2016-02-02 NOTE — Tx Team (Signed)
Interdisciplinary Treatment and Diagnostic Plan Update  02/02/2016 Time of Session: 3:15 PM  Donnis Pecha MRN: [id number]  Principal Diagnosis: Schizophrenia, catatonic type [facility])  Secondary Diagnoses: Principal Problem:   Schizophrenia, catatonic type ([place])   Current Medications:  Current Facility-Administered Medications  Medication Dose Route Frequency Provider Last Rate Last Dose  . acetaminophen (TYLENOL) tablet 650 mg  650 mg Oral Q6H PRN Dara Hoyer, PA-C   650 mg at 01/27/16 0955  . alum & mag hydroxide-simeth (MAALOX/MYLANTA) 200-200-20 MG/5ML suspension 30 mL  30 mL Oral Q4H PRN Dara Hoyer, PA-C      . benztropine (COGENTIN) tablet 0.5 mg  0.5 mg Oral q morning - 10a Saramma Eappen, MD   0.5 mg at 02/01/16 1731  . benztropine (COGENTIN) tablet 1 mg  1 mg Oral QHS Jenne Campus, MD   1 mg at 02/01/16 2153  . feeding supplement (ENSURE ENLIVE) (ENSURE ENLIVE) liquid 237 mL  237 mL Oral BID BM Dara Hoyer, PA-C   237 mL at 02/01/16 1439  . haloperidol (HALDOL) tablet 5 mg  5 mg Oral BID Jenne Campus, MD   5 mg at 02/02/16 6004  . hydrOXYzine (ATARAX/VISTARIL) tablet 25 mg  25 mg Oral Q4H PRN Dara Hoyer, PA-C   25 mg at 01/28/16 5997  . Influenza vac split quadrivalent PF (FLUARIX) injection 0.5 mL  0.5 mL Intramuscular Tomorrow-1000 Saramma Eappen, MD      . LORazepam (ATIVAN) tablet 0.5 mg  0.5 mg Oral QHS Saramma Eappen, MD   0.5 mg at 02/01/16 2153  . risperiDONE (RISPERDAL M-TABS) disintegrating tablet 2 mg  2 mg Oral Q8H PRN Ursula Alert, MD       And  . LORazepam (ATIVAN) tablet 1 mg  1 mg Oral PRN Ursula Alert, MD       And  . ziprasidone (GEODON) injection 20 mg  20 mg Intramuscular PRN Ursula Alert, MD      . magnesium hydroxide (MILK OF MAGNESIA) suspension 30 mL  30 mL Oral Daily PRN Dara Hoyer, PA-C      . traZODone (DESYREL) tablet 50 mg  50 mg Oral QHS PRN Jenne Campus, MD   50 mg at 01/31/16 2121  . [START ON 02/03/2016]  venlafaxine XR (EFFEXOR-XR) 24 hr capsule 75 mg  75 mg Oral Q breakfast Ursula Alert, MD        PTA Medications: Prescriptions Prior to Admission  Medication Sig Dispense Refill Last Dose  . benztropine (COGENTIN) 0.5 MG tablet Take 0.5-1 mg by mouth 2 (two) times daily. 0.5 mg in the morning & 1 mg at bedtime   01/23/2016 at Unknown time  . haloperidol (HALDOL) 5 MG tablet Take 5 mg by mouth 2 (two) times daily.    01/23/2016 at Unknown time  . lisinopril (PRINIVIL,ZESTRIL) 5 MG tablet Take 5 mg by mouth daily.   01/23/2016 at Unknown time    Treatment Modalities: Medication Management, Group therapy, Case management,  1 to 1 session with clinician, Psychoeducation, Recreational therapy.   Physician Treatment Plan for Primary Diagnosis: Schizophrenia, catatonic type ([place]) Long Term Goal(s): Improvement in symptoms so as ready for discharge  Short Term Goals: Ability to verbalize feelings will improve  Medication Management: Evaluate patient's response, side effects, and tolerance of medication regimen.  Therapeutic Interventions: 1 to 1 sessions, Unit Group sessions and Medication administration.  Evaluation of Outcomes: Adequate for Discharge  Physician Treatment Plan for Secondary Diagnosis: Principal Problem:  Schizophrenia, catatonic type ([place])   Long Term Goal(s): Improvement in symptoms so as ready for discharge  Short Term Goals: Compliance with prescribed medications will improve  Medication Management: Evaluate patient's response, side effects, and tolerance of medication regimen.  Therapeutic Interventions: 1 to 1 sessions, Unit Group sessions and Medication administration.  Evaluation of Outcomes: Adequate for Discharge  9/14: Will increase Haldol to 5 mg po daily and 10 mg po qhs for psychosis. Pt did well on the same in the past. Will increase Cogentin to 2 mg po qhs and 0.5 mg po daily for eps. Will continue celexa  20  mg po daily for affective sx. Will increase  Ativan to 1 mg po qhs  for anxiety sx- ? Catatonia. Will discontinue Lisinopril due to low BP . Will monitor BP .   RN Treatment Plan for Primary Diagnosis: Schizophrenia, catatonic type ([place]) Long Term Goal(s): Knowledge of disease and therapeutic regimen to maintain health will improve  Short Term Goals: Ability to participate in decision making will improve  Medication Management: RN will administer medications as ordered by provider, will assess and evaluate patient's response and provide education to patient for prescribed medication. RN will report any adverse and/or side effects to prescribing provider.  Therapeutic Interventions: 1 on 1 counseling sessions, Psychoeducation, Medication administration, Evaluate responses to treatment, Monitor vital signs and CBGs as ordered, Perform/monitor CIWA, COWS, AIMS and Fall Risk screenings as ordered, Perform wound care treatments as ordered.  Evaluation of Outcomes: Adequate for Discharge   LCSW Treatment Plan for Primary Diagnosis: Schizophrenia, catatonic type [facility]) Long Term Goal(s): Safe transition to appropriate next level of care at discharge, Engage patient in therapeutic group addressing interpersonal concerns.  Short Term Goals: Engage patient in aftercare planning with referrals and resources  Therapeutic Interventions: Assess for all discharge needs, 1 to 1 time with Social worker, Explore available resources and support systems, Assess for adequacy in community support network, Educate family and significant other(s) on suicide prevention, Complete Psychosocial Assessment, Interpersonal group therapy.  Evaluation of Outcomes: Met    See below   Progress in Treatment: Attending groups: No Participating in groups: No Taking medication as prescribed: Yes Toleration medication: Yes, no side effects reported at this time Family/Significant other contact made: Yes mother Patient understands diagnosis: No  Limited  insight Discussing patient identified problems/goals with staff: Yes Medical problems stabilized or resolved: Yes Denies suicidal/homicidal ideation: Yes Issues/concerns per patient self-inventory: None Other: N/A  New problem(s) identified: None identified at this time.   New Short Term/Long Term Goal(s): None identified at this time.   Discharge Plan or Barriers:  Return home, follow up outpt  Reason for Continuation of Hospitalization: Depression Hallucinations Medication stabilization   Estimated Length of Stay: Likely d/c tomorrow  Attendees: Patient: 02/02/2016  3:15 PM  Physician: Ursula Alert, MD 02/02/2016  3:15 PM  Nursing: Hoy Register, RN 02/02/2016  3:15 PM  RN Care Manager: Lars Pinks, RN 02/02/2016  3:15 PM  Social Worker: Ripley Fraise 02/02/2016  3:15 PM  Recreational Therapist: Laretta Bolster  02/02/2016  3:15 PM  Other: Norberto Sorenson 02/02/2016  3:15 PM  Other:  02/02/2016  3:15 PM    Scribe for Treatment Team:  Roque Lias 02/02/2016 3:15 PM

## 2016-02-03 MED ORDER — ZOLPIDEM TARTRATE 5 MG PO TABS
5.0000 mg | ORAL_TABLET | Freq: Every evening | ORAL | Status: DC | PRN
  Administered 2016-02-07: 5 mg via ORAL
  Filled 2016-02-03: qty 1

## 2016-02-03 NOTE — Progress Notes (Signed)
D: Isabella [name] continues to be isolative however it is improving as it was reported she went to [employer] today. She remains mostly in her room with minimal interaction and communication. She is pleasant and will answer questions when asked. Denies SI/HI/AVH at this time. Contracts for safety.  A: Encouragement and support given. Q15 minute room checks for patient safety. Medications administered as prescribed.  R: Continue to monitor for patient safety and medication effectiveness.

## 2016-02-03 NOTE — Progress Notes (Signed)
Patient has been in his room for the majority of the shift. Patient was able to get OOB and complete hygiene needs. Patient stated that when she was home that she was often afraid, but she is feeling better now Patient was able to go to meals in the cafeteria this shift.   Assess patient for safety, offer medications as prescribed, engage patient in 1:1 staff talks.   Patient able to contract for safety. Continue to monitor as prescribed.

## 2016-02-03 NOTE — [facility] Group Notes (Signed)
[place] LCSW Group Therapy  02/03/2016 2:35 PM   Type of Therapy:  Group Therapy   Participation Level:  Engaged  Participation Quality:  Attentive  Affect:  Appropriate   Cognitive:  Alert   Insight:  Engaged  Engagement in Therapy:  Improving   Modes of Intervention:  Education, Exploration, Socialization   Summary of Progress/Problems: Divija was invited to group session, chose not to attend.   Shanon Brow from the [facility] was here to tell his story of recovery, inform patients about [facility] and play his guitar.   Radonna Ricker 02/03/2016 2:35 PM

## 2016-02-03 NOTE — Progress Notes (Signed)
[facility] MD Progress Note  02/03/2016 9:33 AM Isabella [name]  MRN:  [id number] Subjective: Patient reports " I am dizzy.'   Objective::Isabella Valenciais a 40 y.o.hispanic female, who has a hx of schizophrenia , who is single , lives in [place], [place] who presented voluntarily with mother for increased catatonic sx .   I have reviewed chart notes and have met with patient . Patient seen today in bed , states she is distressed about her dizziness . Pt states she feels so frustrated today that she does not want to go home and feel bad .She would prefer writer to take care of her dizziness while she is here. Pt per staff - often seen as withdrawn to self , does not attend groups - due to her social anxiety sx. However , she has been making progress in areas like being able to take care of her ADLs as well as taking her meals .  Her VS reviewed , BP continues to be low - unknown if this is her baseline.Unknown if trazodone is making her dizziness worse.   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  Past Medical History: denies Family History: denies hx of Thyroid disease, HTN, DM. Family History  Problem Relation Age of Onset  . Mental illness Neg Hx    Family Psychiatric  History:denies Social History: Please see H&P.  History  Alcohol Use No     History  Drug Use No    Social History   Social History  . Marital status: Single    Spouse name: N/A  . Number of children: N/A  . Years of education: N/A   Social History Main Topics  . Smoking status: Never Smoker  . Smokeless tobacco: Never Used  . Alcohol use No  . Drug use: No  . Sexual activity: Not Asked   Other Topics Concern  . None   Social History Narrative  . None    Additional Social History:    Pain Medications: No abuse reported. Prescriptions: No abuse reported. Mom reports compliance with all medications. Over the Counter: No abuse reported. History of alcohol / drug use?: No history of alcohol / drug abuse  Sleep: improved   Appetite:  improving  Current Medications: Current Facility-Administered Medications  Medication Dose Route Frequency Provider Last Rate Last Dose  . acetaminophen (TYLENOL) tablet 650 mg  650 mg Oral Q6H PRN Dara Hoyer, PA-C   650 mg at 01/27/16 0955  . alum & mag hydroxide-simeth (MAALOX/MYLANTA) 200-200-20 MG/5ML suspension 30 mL  30 mL Oral Q4H PRN Dara Hoyer, PA-C      . benztropine (COGENTIN) tablet 0.5 mg  0.5 mg Oral q morning - 10a Madalena Kesecker, MD   0.5 mg at 02/02/16 1732  . benztropine (COGENTIN) tablet 1 mg  1 mg Oral QHS Jenne Campus, MD   1 mg at 02/02/16 2238  . feeding supplement (ENSURE ENLIVE) (ENSURE ENLIVE) liquid 237 mL  237 mL Oral BID BM Dara Hoyer, PA-C   237 mL at 02/01/16 1439  . haloperidol (HALDOL) tablet 5 mg  5 mg Oral BID Jenne Campus, MD   5 mg at 02/02/16 1732  . hydrOXYzine (ATARAX/VISTARIL) tablet 25 mg  25 mg Oral Q4H PRN Juanda Crumble  Marva Panda, PA-C   25 mg at 01/28/16 3559  . Influenza vac split quadrivalent PF (FLUARIX) injection 0.5 mL  0.5 mL Intramuscular Tomorrow-1000 Sharone Almond, MD      . risperiDONE (RISPERDAL M-TABS) disintegrating tablet 2 mg  2 mg Oral Q8H PRN Ursula Alert, MD       And  . LORazepam (ATIVAN) tablet 1 mg  1 mg Oral PRN Ursula Alert, MD       And  . ziprasidone (GEODON) injection 20 mg  20 mg Intramuscular PRN Ursula Alert, MD      . magnesium hydroxide (MILK OF MAGNESIA) suspension 30 mL  30 mL Oral Daily PRN Dara Hoyer, PA-C      . traZODone (DESYREL) tablet 50 mg  50 mg Oral QHS PRN Jenne Campus, MD   50 mg at 02/02/16 2238  . venlafaxine XR (EFFEXOR-XR) 24 hr capsule 75 mg  75 mg Oral Q breakfast Ursula Alert, MD         Lab Results:  No results found for this or any previous visit (from the past 48 hour(s)).  Blood Alcohol level:  Lab Results  Component Value Date   ETH <5 74/16/3845    Metabolic Disorder Labs: Lab Results  Component Value Date   HGBA1C 5.4 01/31/2016   MPG 108 01/31/2016   Lab Results  Component Value Date   PROLACTIN 124.2 (H) 01/31/2016   Lab Results  Component Value Date   CHOL 152 01/31/2016   TRIG 147 01/31/2016   HDL 39 (L) 01/31/2016   CHOLHDL 3.9 01/31/2016   VLDL 29 01/31/2016   LDLCALC 84 01/31/2016    Physical Findings: AIMS: Facial and Oral Movements Muscles of Facial Expression: None, normal Lips and Perioral Area: None, normal Jaw: None, normal Tongue: None, normal,Extremity Movements Upper (arms, wrists, hands, fingers): None, normal Lower (legs, knees, ankles, toes): None, normal, Trunk Movements Neck, shoulders, hips: None, normal, Overall Severity Severity of abnormal movements (highest score from questions above): None, normal Incapacitation due to abnormal movements: None, normal Patient's awareness of abnormal movements (rate only patient's report): No Awareness, Dental Status Current problems with teeth and/or dentures?: No Does patient usually wear dentures?: No  CIWA:    COWS:     Musculoskeletal: Strength & Muscle Tone: within normal limits Gait & Station: normal Patient leans: N/A  Psychiatric Specialty Exam: Physical Exam  Nursing note and vitals reviewed.   Review of Systems  Neurological: Positive for dizziness.  Psychiatric/Behavioral: Positive for depression. The patient is nervous/anxious.   All other systems reviewed and are negative.   Blood pressure 97/65, pulse 81, temperature 98.3 F (36.8 C), temperature source Oral, resp. rate 18, height 5' 4"  (1.626 m), weight 65.3 kg (144 lb), last menstrual period 01/03/2016, SpO2 99 %.Body mass index is 24.72 kg/m.  General Appearance: Fairly Groomed  Eye Contact:   Fair  Speech:  Slow  Volume:  Decreased  Mood:  depressed - improving  Affect:  Congruent  Thought Process:   Linear   Orientation:   Alert and attentive   Thought Content:   Currently denies hallucinations, and does not appear internally preoccupied   Suicidal Thoughts:  No- at this time denies suicidal plan or intention , also denies violent or homicidal ideations   Homicidal Thoughts:  No  Memory:  Recent and remote fair , immediate - fair  Judgement:  Fair  Insight:  Fair  Psychomotor Activity:  Decreased  Concentration:  Concentration: Fair and Attention Span:  Fair  Recall:  Smiley Houseman of Knowledge:  Fair  Language:  Fair  Akathisia:  No  Handed:  Right  AIMS (if indicated):     Assets:  Desire for Improvement  ADL's:  Impaired  Cognition:  WNL  Sleep:  Number of Hours: 6.75     Assessment -:Jermisha Valenciais a 40 y.o.female, who has a hx of schizophrenia , who is single , lives in [place], [place] who presented voluntarily with mother for increased catatonic sx .Pt with some overall improvement , however , continues to have dizziness this AM. Will readjust her medications .   Daily contact with patient to assess and evaluate symptoms and progress in treatment and Medication management Will   continue Haldol  5 mg po bid for psychosis. Pt did well on the same in the past. Will continue Cogentin    1 mg po qhs and 0.5 mg po daily for eps. Increased Effexor XR to 75 mg po daily for affective sx. Will discontinue Ativan due to her dizziness. Will also discontinue Trazodone and change it to Ambien for sleep. Will encourage PO fluids . Will discontinue Lisinopril due to low BP . Will monitor BP .BP continues to be low. Will continue to monitor vitals ,medication compliance and treatment side effects while patient is here.  Will monitor for medical issues as well as call consult as needed.  Reviewed labs cbc - - hb - slightly low , cmp - wnl, UDS- negative , BAL <5  , lipid  panel- wnl ,hba1c- wnl , pl - 124 - elevated - will need to be monitored , as well as EKG for qtc- wnl . CSW will continue working on disposition.  Patient to participate in therapeutic milieu .  Rocco Kerkhoff, MD 02/03/2016, 9:33 AM

## 2016-02-03 NOTE — Progress Notes (Signed)
Recreation Therapy Notes  Date: 02/03/16 Time: 1000 Location: 500 Hall Dayroom  Group Topic: Wellness  Goal Area(s) Addresses:  Patient will define components of whole wellness. Patient will verbalize benefit of whole wellness.  Intervention:  Dry erase marker, dry erase board, eraser   Activity: Wellness Pictionary.  LRT had a can with various activities that could be used to enhance mental, physical, emotional and spiritual health.  Patients were to pull a strip of paper from the can with one of the activities on it.  The patient was to draw whatever was on the paper onto the board.  The remaining patients would then try to guess what the patient is drawing.  Education: Wellness, Dentist.   Education Outcome: Needs additional education.   Clinical Observations/Feedback: Pt did not attend group.   Victorino Sparrow, LRT/CTRS      Victorino Sparrow A 02/03/2016 11:58 AM

## 2016-02-04 MED ORDER — HALOPERIDOL 5 MG PO TABS
5.0000 mg | ORAL_TABLET | Freq: Every evening | ORAL | Status: AC
  Administered 2016-02-04: 5 mg via ORAL
  Filled 2016-02-04: qty 1

## 2016-02-04 MED ORDER — BENZTROPINE MESYLATE 1 MG PO TABS
1.0000 mg | ORAL_TABLET | Freq: Every evening | ORAL | Status: DC
  Administered 2016-02-04 – 2016-02-07 (×4): 1 mg via ORAL
  Filled 2016-02-04 (×2): qty 1
  Filled 2016-02-04: qty 7
  Filled 2016-02-04 (×3): qty 1

## 2016-02-04 MED ORDER — HALOPERIDOL 5 MG PO TABS
10.0000 mg | ORAL_TABLET | Freq: Every evening | ORAL | Status: DC
  Administered 2016-02-05 – 2016-02-07 (×3): 10 mg via ORAL
  Filled 2016-02-04: qty 14
  Filled 2016-02-04 (×4): qty 2

## 2016-02-04 NOTE — Progress Notes (Signed)
D: Pt is flat, isolative and withdrawn to room-Pt remained in room all evening refusing to participate. Pt was observed in bed with eyes closed all evening. Pt endorsed mild depression however states that she feels better. Pt denied SI, HI, VAH and pain. Pt remained calm and cooperative. A: Medications offered as prescribed.  Support, encouragement, and safe environment provided.  15-minute safety checks continue. R: Pt was med compliant.  Pt did not attend group. Safety checks continue

## 2016-02-04 NOTE — Progress Notes (Signed)
DAR NOTE:  Patient mood and affect remained depressed.  Patient remain isolative to her room most of this shift.  Patient only came out of her room for meals and medications.  Denies pain, auditory and visual hallucinations.  Patient states "I feel a lot better today."  Medications given as prescribed.  Routine safety checks maintained.  Attended group and participated.

## 2016-02-04 NOTE — Tx Team (Signed)
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 acetaminophen (TYLENOL) tablet 650 mg  650 mg Oral Q6H PRN Dara Hoyer, PA-C   650 mg at 01/27/16 0955  . alum & mag hydroxide-simeth (MAALOX/MYLANTA) 200-200-20 MG/5ML suspension 30 mL  30 mL Oral Q4H PRN Dara Hoyer, PA-C      . benztropine (COGENTIN) tablet 0.5 mg  0.5 mg Oral q morning - 10a Ursula Alert, MD   0.5 mg at 02/04/16 0918  . benztropine (COGENTIN) tablet 1 mg  1 mg Oral QHS Jenne Campus, MD   1 mg at 02/03/16 2151  . feeding supplement (ENSURE ENLIVE) (ENSURE ENLIVE) liquid 237 mL  237 mL Oral BID BM Dara Hoyer, PA-C   237 mL at 02/04/16 0917  . haloperidol (HALDOL) tablet 5 mg  5 mg Oral BID Jenne Campus, MD   5 mg at 02/04/16 0916  . hydrOXYzine (ATARAX/VISTARIL) tablet 25 mg  25 mg Oral Q4H PRN Dara Hoyer, PA-C   25 mg at 01/28/16 7169  . Influenza vac split quadrivalent PF (FLUARIX) injection 0.5 mL  0.5 mL Intramuscular Tomorrow-1000 Saramma Eappen, MD      . risperiDONE (RISPERDAL M-TABS) disintegrating tablet 2 mg  2 mg Oral Q8H PRN Ursula Alert, MD       And  . LORazepam (ATIVAN) tablet 1 mg  1 mg Oral PRN Ursula Alert, MD       And  . ziprasidone (GEODON) injection 20 mg  20 mg Intramuscular PRN Ursula Alert, MD      . magnesium hydroxide (MILK OF MAGNESIA) suspension 30 mL  30 mL Oral Daily PRN Dara Hoyer, PA-C      . venlafaxine XR (EFFEXOR-XR) 24 hr capsule 75 mg  75 mg Oral Q breakfast Saramma Eappen, MD   75 mg at 02/04/16 0917  . zolpidem (AMBIEN) tablet 5 mg  5 mg Oral QHS PRN Ursula Alert, MD        PTA  Medications: Prescriptions Prior to Admission  Medication Sig Dispense Refill Last Dose  . benztropine (COGENTIN) 0.5 MG tablet Take 0.5-1 mg by mouth 2 (two) times daily. 0.5 mg in the morning & 1 mg at bedtime   01/23/2016 at Unknown time  . haloperidol (HALDOL) 5 MG tablet Take 5 mg by mouth 2 (two) times daily.    01/23/2016 at Unknown time  . lisinopril (PRINIVIL,ZESTRIL) 5 MG tablet Take 5 mg by mouth daily.   01/23/2016 at Unknown time    Treatment Modalities: Medication Management, Group therapy, Case management,  1 to 1 session with clinician, Psychoeducation, Recreational therapy.   Physician Treatment Plan for Primary Diagnosis: Schizophrenia, catatonic type ([place]) Long Term Goal(s): Improvement in symptoms so as ready for discharge  Short Term Goals: Ability to verbalize feelings will improve  Medication Management: Evaluate patient's response, side effects, and tolerance of medication regimen.  Therapeutic Interventions: 1 to 1 sessions, Unit Group sessions and Medication administration.  Evaluation of Outcomes: Adequate for Discharge  Physician Treatment Plan for Secondary Diagnosis: Principal Problem:   Schizophrenia, catatonic type ([place])   Long Term Goal(s): Improvement in symptoms so as ready for discharge  Short Term Goals: Compliance with prescribed medications  will improve  Medication Management: Evaluate patient's response, side effects, and tolerance of medication regimen.  Therapeutic Interventions: 1 to 1 sessions, Unit Group sessions and Medication administration.  Evaluation of Outcomes: Progressing  9/14: Will increase Haldol to 5 mg po daily and 10 mg po qhs for psychosis. Pt did well on the same in the past. Will increase Cogentin to 2 mg po qhs and 0.5 mg po daily for eps. Will continue celexa  20  mg po daily for affective sx. Will increase Ativan to 1 mg po qhs  for anxiety sx- ? Catatonia. Will discontinue Lisinopril due to low BP . Will monitor BP  .  9/21:  Pt has been c/o dizziness. Dr has requested a medical consult and is making medication adjustments.   RN Treatment Plan for Primary Diagnosis: Schizophrenia, catatonic type ([place]) Long Term Goal(s): Knowledge of disease and therapeutic regimen to maintain health will improve  Short Term Goals: Ability to participate in decision making will improve  Medication Management: RN will administer medications as ordered by provider, will assess and evaluate patient's response and provide education to patient for prescribed medication. RN will report any adverse and/or side effects to prescribing provider.  Therapeutic Interventions: 1 on 1 counseling sessions, Psychoeducation, Medication administration, Evaluate responses to treatment, Monitor vital signs and CBGs as ordered, Perform/monitor CIWA, COWS, AIMS and Fall Risk screenings as ordered, Perform wound care treatments as ordered.  Evaluation of Outcomes: Adequate for Discharge   LCSW Treatment Plan for Primary Diagnosis: Schizophrenia, catatonic type [facility]) Long Term Goal(s): Safe transition to appropriate next level of care at discharge, Engage patient in therapeutic group addressing interpersonal concerns.  Short Term Goals: Engage patient in aftercare planning with referrals and resources  Therapeutic Interventions: Assess for all discharge needs, 1 to 1 time with Social worker, Explore available resources and support systems, Assess for adequacy in community support network, Educate family and significant other(s) on suicide prevention, Complete Psychosocial Assessment, Interpersonal group therapy.  Evaluation of Outcomes: Met    See below   Progress in Treatment: Attending groups: No Participating in groups: No Taking medication as prescribed: Yes Toleration medication: Yes, no side effects reported at this time Family/Significant other contact made: Yes mother Patient understands diagnosis: No  Limited insight Discussing  patient identified problems/goals with staff: Yes Medical problems stabilized or resolved: Yes Denies suicidal/homicidal ideation: Yes Issues/concerns per patient self-inventory: None Other: N/A  New problem(s) identified: None identified at this time.   New Short Term/Long Term Goal(s): None identified at this time.   Discharge Plan or Barriers:  Return home, follow up outpt  Reason for Continuation of Hospitalization: Depression Hallucinations Medication stabilization   Estimated Length of Stay: 1-4 days  Attendees: Patient: 02/04/2016  10:16 AM  Physician: Ursula Alert, MD 02/04/2016  10:16 AM  Nursing: Hoy Register, RN 02/04/2016  10:16 AM  RN Care Manager: Lars Pinks, RN 02/04/2016  10:16 AM  Social Worker: Ripley Fraise 02/04/2016  10:16 AM  Recreational Therapist: Laretta Bolster  02/04/2016  10:16 AM  Other: Norberto Sorenson 02/04/2016  10:16 AM  Other:  02/04/2016  10:16 AM    Scribe for Treatment Team:  Roque Lias 02/04/2016 10:16 AM

## 2016-02-04 NOTE — Progress Notes (Signed)
[facility] MD Progress Note  02/04/2016 12:18 PM Isabella [name]  MRN:  [id number] Subjective: Patient reports " I am dizzy. I do not think I am ready to go home .'   Objective::Isabella Valenciais a 40 y.o.hispanic female, who has a hx of schizophrenia , who is single , lives in [place], [place] who presented voluntarily with mother for increased catatonic sx .   I have reviewed chart notes and have met with patient . Patient seen today in bed , states she is distressed about her dizziness . Pt per staff continues to be withdrawn to self , but has been taking her medications as well as taking care of her ADLs. Pt continues to stay that she does not want to be discharged since she is still dizzy.   Her VS reviewed , BP continues to be low .Will encourage PO fluids.  Principal Problem: Schizophrenia, catatonic type [facility]) Diagnosis:   Patient Active Problem List   Diagnosis Date Noted  . 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  Past Medical History: denies Family History: denies hx of Thyroid disease, HTN, DM. Family History  Problem Relation Age of Onset  . Mental illness Neg Hx    Family Psychiatric  History:denies Social History: Please see H&P.  History  Alcohol Use No     History  Drug Use No    Social History   Social History  . Marital status: Single    Spouse name: N/A  . Number of children: N/A  . Years of education: N/A   Social History Main Topics  . Smoking status: Never Smoker  . Smokeless tobacco: Never Used  . Alcohol use No  . Drug use: No  . Sexual activity: Not Asked   Other Topics Concern  . None   Social History Narrative  . None   Additional Social History:    Pain Medications: No abuse reported. Prescriptions: No abuse reported. Mom reports compliance with all medications. Over the Counter: No abuse  reported. History of alcohol / drug use?: No history of alcohol / drug abuse  Sleep: improved   Appetite:  improving  Current Medications: Current Facility-Administered Medications  Medication Dose Route Frequency Provider Last Rate Last Dose  . acetaminophen (TYLENOL) tablet 650 mg  650 mg Oral Q6H PRN Dara Hoyer, PA-C   650 mg at 01/27/16 0955  . alum & mag hydroxide-simeth (MAALOX/MYLANTA) 200-200-20 MG/5ML suspension 30 mL  30 mL Oral Q4H PRN Dara Hoyer, PA-C      . benztropine (COGENTIN) tablet 0.5 mg  0.5 mg Oral q morning - 10a Ursula Alert, MD   0.5 mg at 02/04/16 0918  . benztropine (COGENTIN) tablet 1 mg  1 mg Oral QHS Jenne Campus, MD   1 mg at 02/03/16 2151  . feeding supplement (ENSURE ENLIVE) (ENSURE ENLIVE) liquid 237 mL  237 mL Oral BID BM Dara Hoyer, PA-C   237 mL at 02/04/16 0917  . haloperidol (HALDOL) tablet 5 mg  5 mg Oral BID Jenne Campus, MD   5 mg at 02/04/16 0916  . hydrOXYzine (ATARAX/VISTARIL) tablet 25 mg  25 mg Oral Q4H PRN Dara Hoyer, PA-C   25 mg at 01/28/16 3419  . Influenza vac split quadrivalent PF (FLUARIX) injection 0.5 mL  0.5 mL Intramuscular Tomorrow-1000 Cristel Rail, MD      . risperiDONE (RISPERDAL  M-TABS) disintegrating tablet 2 mg  2 mg Oral Q8H PRN Ursula Alert, MD       And  . LORazepam (ATIVAN) tablet 1 mg  1 mg Oral PRN Ursula Alert, MD       And  . ziprasidone (GEODON) injection 20 mg  20 mg Intramuscular PRN Ursula Alert, MD      . magnesium hydroxide (MILK OF MAGNESIA) suspension 30 mL  30 mL Oral Daily PRN Dara Hoyer, PA-C      . venlafaxine XR (EFFEXOR-XR) 24 hr capsule 75 mg  75 mg Oral Q breakfast Hazel Leveille, MD   75 mg at 02/04/16 0917  . zolpidem (AMBIEN) tablet 5 mg  5 mg Oral QHS PRN Ursula Alert, MD        Lab Results:  No results found for this or any previous visit (from the past 48 hour(s)).  Blood Alcohol level:  Lab Results  Component Value Date   ETH <5 85/46/2703     Metabolic Disorder Labs: Lab Results  Component Value Date   HGBA1C 5.4 01/31/2016   MPG 108 01/31/2016   Lab Results  Component Value Date   PROLACTIN 124.2 (H) 01/31/2016   Lab Results  Component Value Date   CHOL 152 01/31/2016   TRIG 147 01/31/2016   HDL 39 (L) 01/31/2016   CHOLHDL 3.9 01/31/2016   VLDL 29 01/31/2016   LDLCALC 84 01/31/2016    Physical Findings: AIMS: Facial and Oral Movements Muscles of Facial Expression: None, normal Lips and Perioral Area: None, normal Jaw: None, normal Tongue: None, normal,Extremity Movements Upper (arms, wrists, hands, fingers): None, normal Lower (legs, knees, ankles, toes): None, normal, Trunk Movements Neck, shoulders, hips: None, normal, Overall Severity Severity of abnormal movements (highest score from questions above): None, normal Incapacitation due to abnormal movements: None, normal Patient's awareness of abnormal movements (rate only patient's report): No Awareness, Dental Status Current problems with teeth and/or dentures?: No Does patient usually wear dentures?: No  CIWA:    COWS:     Musculoskeletal: Strength & Muscle Tone: within normal limits Gait & Station: normal Patient leans: N/A  Psychiatric Specialty Exam: Physical Exam  Nursing note and vitals reviewed.   Review of Systems  Neurological: Positive for dizziness.  Psychiatric/Behavioral: Positive for depression. The patient is nervous/anxious.   All other systems reviewed and are negative.   Blood pressure 98/71, pulse 85, temperature 98.6 F (37 C), temperature source Oral, resp. rate 20, height 5' 4"  (1.626 m), weight 65.3 kg (144 lb), last menstrual period 01/03/2016, SpO2 99 %.Body mass index is 24.72 kg/m.  General Appearance: Fairly Groomed  Eye Contact:  Fair  Speech:  Slow  Volume:  Decreased  Mood:  depressed - improving  Affect:  Congruent  Thought Process:   Linear   Orientation:   Alert and attentive   Thought Content:    Currently denies hallucinations, and does not appear internally preoccupied   Suicidal Thoughts:  No- at this time denies suicidal plan or intention , also denies violent or homicidal ideations   Homicidal Thoughts:  No  Memory:  Recent and remote fair , immediate - fair  Judgement:  Fair  Insight:  Fair  Psychomotor Activity:  Decreased  Concentration:  Concentration: Fair and Attention Span: Fair  Recall:  [employer] of Knowledge:  Fair  Language:  Fair  Akathisia:  No  Handed:  Right  AIMS (if indicated):     Assets:  Desire for Improvement  ADL's:  Impaired  Cognition:  WNL  Sleep:  Number of Hours: 6.75     Assessment -:Isabella Valenciais a 40 y.o.female, who has a hx of schizophrenia , who is single , lives in [place], [place] who presented voluntarily with mother for increased catatonic sx .Pt with some overall improvement , however , continues to have dizziness this AM. Will readjust her medications .Isabella Munro May NP to assess patient for the need for a hospitalist consult.   Daily contact with patient to assess and evaluate symptoms and progress in treatment and Medication management Will change Haldol to 10 mg po qpm for psychosis. Pt did well on the same in the past.Dose readjusted since pt is dizzy. Will continue Cogentin    1 mg po qpm  for eps. Will continue Effexor XR to 75 mg po daily for affective sx. Discontinued Ativan due to her dizziness. Discontinued Trazodone and changed it to Ambien for sleep. Will encourage PO fluids . Will discontinue Lisinopril due to low BP . Will monitor BP .BP continues to be low. Will continue to monitor vitals ,medication compliance and treatment side effects while patient is here.  Will monitor for medical issues as well as call consult as needed.  Reviewed labs cbc - - hb - slightly low , cmp - wnl, UDS- negative , BAL <5  , lipid panel- wnl ,hba1c- wnl , pl - 124 - elevated - will need to be monitored , as well as EKG for qtc- wnl . CSW  will continue working on disposition.  Patient to participate in therapeutic milieu .  Isabella Kampf, MD 02/04/2016, 12:18 PM

## 2016-02-04 NOTE — Progress Notes (Signed)
Recreation Therapy Notes  Date: 02/04/16 Time: 1000 Location: 500 Hall Dayroom  Group Topic: Coping Skills  Goal Area(s) Addresses:  Patients will be able to identify positive coping skills. Patients will be able to identify benefits of coping skills post d/c.  Behavioral Response: Reserved, attentive  Intervention: Worksheets, Corporate investment banker, glue, Architect paper, magazines  Activity: Museum/gallery conservator.  LRT went over the concept of coping skills and why they are important.  Patients were to use the magazines to cut out pictures of the coping skills they use or would like to learn.  Patients were then to glue the pictures onto the worksheet.  The worksheet is broken down into 5 components were coping skills can be used.  Those components are diversions, social, cognitive, tension releasers and physical.    Education: Coping Skills, Discharge Planning.   Education Outcome: Acknowledges understanding/In group clarification offered/Needs additional education.   Clinical Observations/Feedback: Pt identified her coping skills as pampering herself and doing Architect.  Pt stated using her coping skills will "help me to go home".   Victorino Sparrow, LRT/CTRS      Victorino Sparrow A 02/04/2016 12:14 PM

## 2016-02-04 NOTE — [facility] Group Notes (Signed)
Type of Therapy: Process Group Therapy  Participation Level:  Active  Participation Quality:  Appropriate  Affect:  Flat  Cognitive:  Oriented  Insight:  Improving  Engagement in Group:  Limited  Engagement in Therapy:  Limited  Modes of Intervention:  Activity, Clarification, Education, Problem-solving and Support  Summary of Progress/Problems: Patient was invited, chose not to attend.   Today's group addressed balance in life. Patients participated in the discussion about when their life was in balance and out of balance and how this feels. Patients discussed ways to get back in balance and short term goals they can work on to get where they want to be.

## 2016-02-05 DIAGNOSIS — R42 Dizziness and giddiness: Secondary | ICD-10-CM

## 2016-02-05 DIAGNOSIS — F202 Catatonic schizophrenia: Principal | ICD-10-CM

## 2016-02-05 MED ORDER — VENLAFAXINE HCL ER 150 MG PO CP24: 150.0000 mg | ORAL_CAPSULE | Freq: Every day | ORAL | Status: DC

## 2016-02-05 MED ORDER — VENLAFAXINE HCL ER 75 MG PO CP24
75.0000 mg | ORAL_CAPSULE | Freq: Every day | ORAL | Status: DC
  Administered 2016-02-06 – 2016-02-08 (×3): 75 mg via ORAL
  Filled 2016-02-05 (×4): qty 1
  Filled 2016-02-05: qty 7

## 2016-02-05 NOTE — Progress Notes (Signed)
[facility] MD Progress Note  02/05/2016 11:33 AM Sofhia Ulibarri  MRN:  [id number] Subjective: Patient reports " I am dizzy. I am still dizzy . I do not want to get up now."    Objective::Alleen Valenciais a 40 y.o.hispanic female, who has a hx of schizophrenia , who is single , lives in [place], [place] who presented voluntarily with mother for increased catatonic sx .   I have reviewed chart notes and have met with patient . Patient seen today in bed , states she continues to be dizzy . Pt reports she does not want to attend groups - does not want to participate with other peers since she feels she cannot walk due to her dizziness. Per nursing pt continues to be isolative often , sleeps in her room all day - takes medications , has been taking care of her ADLs. Writer discussed this with Craig Staggers NP - to consult hospitalist for evaluation of patient's dizziness that has been ongoing for the past 3 days , and does not seem to be improving inspite of several medication readjustments as well as changing the time schedule of other medications from AM to PM. That being said - patient's dizziness started the day writer discussed discharge , unknown if she has any conflicts with her mother and does not want to return home . Patient is unable to verbalize this . However , it has been reported to Probation officer previously by staff that - pt seems to become more mute and isolative and sleepy when mother visits her during visiting hrs , however , she was observed as being more engaging and talkative with her father and other family members on the same days that mother visited.    Principal Problem: Schizophrenia, catatonic type [facility]) Diagnosis:   Patient Active Problem List   Diagnosis Date Noted  . 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  Past Medical History: denies Family  History: denies hx of Thyroid disease, HTN, DM. Family History  Problem Relation Age of Onset  . Mental illness Neg Hx    Family Psychiatric  History:denies Social History: Please see H&P.  History  Alcohol Use No     History  Drug Use No    Social History   Social History  . Marital status: Single    Spouse name: N/A  . Number of children: N/A  . Years of education: N/A   Social History Main Topics  . Smoking status: Never Smoker  . Smokeless tobacco: Never Used  . Alcohol use No  . Drug use: No  . Sexual activity: Not Asked   Other Topics Concern  . None   Social History Narrative  . None   Additional Social History:    Pain Medications: No abuse reported. Prescriptions: No abuse reported. Mom reports compliance with all medications. Over the Counter: No abuse reported. History of alcohol / drug use?: No history of alcohol / drug abuse  Sleep: improved   Appetite:  improving  Current Medications: Current Facility-Administered Medications  Medication Dose Route Frequency Provider Last Rate Last Dose  . acetaminophen (TYLENOL) tablet 650 mg  650 mg Oral Q6H PRN Dara Hoyer, PA-C   650 mg at 01/27/16 0955  . alum & mag hydroxide-simeth (MAALOX/MYLANTA) 200-200-20 MG/5ML suspension 30 mL  30 mL Oral Q4H PRN Dara Hoyer, PA-C      .  benztropine (COGENTIN) tablet 1 mg  1 mg Oral QPM Deuce Paternoster, MD   1 mg at 02/04/16 1711  . feeding supplement (ENSURE ENLIVE) (ENSURE ENLIVE) liquid 237 mL  237 mL Oral BID BM Dara Hoyer, PA-C   237 mL at 02/04/16 1559  . haloperidol (HALDOL) tablet 10 mg  10 mg Oral QPM Lourdes Kucharski, MD      . hydrOXYzine (ATARAX/VISTARIL) tablet 25 mg  25 mg Oral Q4H PRN Dara Hoyer, PA-C   25 mg at 01/28/16 7425  . Influenza vac split quadrivalent PF (FLUARIX) injection 0.5 mL  0.5 mL Intramuscular Tomorrow-1000 Adely Facer, MD      . risperiDONE (RISPERDAL M-TABS) disintegrating tablet 2 mg  2 mg Oral Q8H PRN Ursula Alert, MD       And  . LORazepam (ATIVAN) tablet 1 mg  1 mg Oral PRN Ursula Alert, MD       And  . ziprasidone (GEODON) injection 20 mg  20 mg Intramuscular PRN Ursula Alert, MD      . magnesium hydroxide (MILK OF MAGNESIA) suspension 30 mL  30 mL Oral Daily PRN Dara Hoyer, PA-C      . Derrill Memo ON 02/06/2016] venlafaxine XR (EFFEXOR-XR) 24 hr capsule 150 mg  150 mg Oral Q breakfast Charvis Lightner, MD      . zolpidem (AMBIEN) tablet 5 mg  5 mg Oral QHS PRN Ursula Alert, MD        Lab Results:  No results found for this or any previous visit (from the past 48 hour(s)).  Blood Alcohol level:  Lab Results  Component Value Date   ETH <5 95/63/8756    Metabolic Disorder Labs: Lab Results  Component Value Date   HGBA1C 5.4 01/31/2016   MPG 108 01/31/2016   Lab Results  Component Value Date   PROLACTIN 124.2 (H) 01/31/2016   Lab Results  Component Value Date   CHOL 152 01/31/2016   TRIG 147 01/31/2016   HDL 39 (L) 01/31/2016   CHOLHDL 3.9 01/31/2016   VLDL 29 01/31/2016   LDLCALC 84 01/31/2016    Physical Findings: AIMS: Facial and Oral Movements Muscles of Facial Expression: None, normal Lips and Perioral Area: None, normal Jaw: None, normal Tongue: None, normal,Extremity Movements Upper (arms, wrists, hands, fingers): None, normal Lower (legs, knees, ankles, toes): None, normal, Trunk Movements Neck, shoulders, hips: None, normal, Overall Severity Severity of abnormal movements (highest score from questions above): None, normal Incapacitation due to abnormal movements: None, normal Patient's awareness of abnormal movements (rate only patient's report): No Awareness, Dental Status Current problems with teeth and/or dentures?: No Does patient usually wear dentures?: No  CIWA:    COWS:     Musculoskeletal: Strength & Muscle Tone: within normal limits Gait & Station: normal Patient leans: N/A  Psychiatric Specialty Exam: Physical Exam  Nursing note and  vitals reviewed.   Review of Systems  Neurological: Positive for dizziness.  Psychiatric/Behavioral: Positive for depression. The patient is nervous/anxious.   All other systems reviewed and are negative.   Blood pressure 106/63, pulse 67, temperature 98.2 F (36.8 C), temperature source Oral, resp. rate 17, height 5' 4"  (1.626 m), weight 65.3 kg (144 lb), last menstrual period 01/03/2016, SpO2 99 %.Body mass index is 24.72 kg/m.  General Appearance: Fairly Groomed  Eye Contact:  Fair  Speech:  Slow  Volume:  Decreased  Mood:  depressed - improving, anxious - about being dizzy  Affect:  Congruent  Thought Process:  Linear   Orientation:   Alert and attentive   Thought Content:   Currently denies hallucinations, and does not appear internally preoccupied   Suicidal Thoughts:  No- at this time denies suicidal plan or intention , also denies violent or homicidal ideations   Homicidal Thoughts:  No  Memory:  Recent and remote fair , immediate - fair  Judgement:  Fair  Insight:  Fair  Psychomotor Activity:  Decreased  Concentration:  Concentration: Fair and Attention Span: Fair  Recall:  [employer] of Knowledge:  Fair  Language:  Fair  Akathisia:  No  Handed:  Right  AIMS (if indicated):     Assets:  Desire for Improvement  ADL's:  Impaired  Cognition:  WNL  Sleep:  Number of Hours: 6.5     Assessment -:Adrinne Valenciais a 40 y.o.female, who has a hx of schizophrenia , who is single , lives in [place], [place] who presented voluntarily with mother for increased catatonic sx .Pt with some overall improvement , however , continues to have dizziness this AM. Discussed with Bubba Camp NP to make hospitalist consult.   Daily contact with patient to assess and evaluate symptoms and progress in treatment and Medication management Changed Haldol to 10 mg po qpm for psychosis. Pt did well on the same in the past.Dose readjusted since pt is dizzy. Will continue Cogentin    1 mg po qpm   for eps. Will continue Effexor xr 75 mg po daily for affective sx. Will hold off - if dizziness seems to not improve today. Discontinued Ativan due to her dizziness. Discontinued Trazodone and changed it to Ambien for sleep. Will encourage PO fluids . Will discontinue Lisinopril due to low BP . Will monitor BP .BP continues to be low. Hospitalist consult for dizziness. Will continue to monitor vitals ,medication compliance and treatment side effects while patient is here.  Will monitor for medical issues as well as call consult as needed.  Reviewed labs cbc - - hb - slightly low , cmp - wnl, UDS- negative , BAL <5  , lipid panel- wnl ,hba1c- wnl , pl - 124 - elevated - will need to be monitored , as well as EKG for qtc- wnl . CSW will continue working on disposition.  Patient to participate in therapeutic milieu .  Ahren Pettinger, MD 02/05/2016, 11:33 AM

## 2016-02-05 NOTE — Plan of Care (Signed)
Problem: Safety: Goal: Periods of time without injury will increase Outcome: Progressing Pt. remains a low fall risk, denies SI/HI at this time, Q 15 checks in place for safety.    

## 2016-02-05 NOTE — Progress Notes (Signed)
Patient ID: Isabella [name], female   DOB: 1976-01-15, 40 y.o.   MRN: [id number] D) Pt has been isolative to room. Negative for groups and off unit activities. Pt c/o feeling "dizzy" upon rising. Pt denies s.i. A) Level 3  obs for safety, support and reassurance provided. Fall risk preventions reinforced. R) Safety maintained.

## 2016-02-05 NOTE — Consult Note (Signed)
Medical Consultation   Isabella [name]  [id number]  DOB: 1975/09/16  DOA: 01/24/2016  PCP: No primary care provider on file.  Requesting physician: Ursula Alert, MD  Reason for consultation: Lightheadedness   History of Present Illness: Isabella [name] is a 40 y.o. female the past history of schizophrenia. Patient states that about 4 days ago she started having symptoms of lightheadedness. She reports no vertigo symptoms. No associated chest pain, palpitations, dyspnea, leg swelling. She states she has not had these episodes before.  Upon chart review, patient has had soft blood pressures in the [id number] systolics. Orthostatic vitals were obtained earlier today and were negative.  Review of Systems:  Review of Systems  Constitutional: Negative for chills and fever.  Eyes: Negative for blurred vision and double vision.  Respiratory: Negative for cough, hemoptysis, sputum production and shortness of breath.   Cardiovascular: Negative for chest pain, palpitations and leg swelling.  Gastrointestinal: Negative for abdominal pain, constipation, diarrhea, nausea and vomiting.  Neurological: Negative for dizziness, loss of consciousness and headaches.       Lightheadedness  All other systems reviewed and are negative.   Past Medical History: History reviewed. No pertinent past medical history.  Past Surgical History: History reviewed. No pertinent surgical history.   Allergies:  No Known Allergies   Social History:  reports that she has never smoked. She has never used smokeless tobacco. She reports that she does not drink alcohol or use drugs.   Family History: Family History  Problem Relation Age of Onset  . Mental illness Neg Hx     Physical Exam: Vitals:   02/04/16 0707 02/05/16 0615 02/05/16 0616 02/05/16 1032  BP: 98/71 102/73 (!) 84/66 106/63  Pulse: 85 80 (!) 108 67  Resp:  18  17  Temp:  98.1 F (36.7 C)  98.2 F (36.8 C)  TempSrc:  Oral   Oral  SpO2:    99%  Weight:      Height:        Constitutional: Alert and awake, oriented x3, not in any acute distress. Eyes: PERLA, EOMI, irises appear normal, anicteric sclera,  ENMT: external ears and nose appear normal            Lips appears normal, oropharynx mucosa, tongue, posterior pharynx appear normal  Neck: neck appears normal, no masses, normal ROM, no thyromegaly, no JVD  CVS: S1-S2 clear, no murmur rubs or gallops, no LE edema, normal pedal pulses. she has slightly frequent likely PVCs Respiratory:  clear to auscultation bilaterally, no wheezing, rales or rhonchi. Respiratory effort normal. No accessory muscle use.  Abdomen: soft nontender, nondistended, normal bowel sounds, no hepatosplenomegaly, no hernias  Musculoskeletal: : no cyanosis, clubbing or edema noted bilaterally Neuro: Cranial nerves II-XII intact, strength, sensation, reflexes Psych: Flat affect. Mood appears depressed Skin: no rashes or lesions or ulcers, no induration or nodules   Data reviewed:  I have personally reviewed following labs and imaging studies Labs:  CBC: No results for input(s): WBC, NEUTROABS, HGB, HCT, MCV, PLT in the last 168 hours.  Inpatient Medications:   Scheduled Meds: . benztropine  1 mg Oral QPM  . feeding supplement (ENSURE ENLIVE)  237 mL Oral BID BM  . haloperidol  10 mg Oral QPM  . Influenza vac split quadrivalent PF  0.5 mL Intramuscular Tomorrow-1000  . [START ON 02/06/2016] venlafaxine XR  75 mg Oral Q breakfast   Continuous Infusions:  Radiological Exams on Admission: No results found.  Impression/Recommendations Principal Problem:   Schizophrenia, catatonic type ([place])  Lightheadedness Possibly related to soft blood pressure. She does have an arrhythmia on auscultation. Previous EKG significant for normal sinus rhythm. Orthostatic vitals are negative -Will obtain an EKG -Advised patient to take about 3 minutes between lying and sitting in between sitting  and standing before movement -Advised to increase fluid intake -Will need follow-up with the PCP upon discharge  Thank you for this consultation.  Our [facility] hospitalist team will follow the patient with you.   Cordelia Poche M.D. [facility] 02/05/2016, 2:04 PM

## 2016-02-05 NOTE — Progress Notes (Deleted)
Patient refused to complete EKG at this time. The patient stated " Im not comfortable with this yet".

## 2016-02-05 NOTE — Progress Notes (Signed)
Recreation Therapy Notes  Date: 02/05/16 Time: 1000 Location: 500 [address] Dayroom  Group Topic: Communication, Team Building, Problem Solving  Goal Area(s) Addresses:  Patient will effectively work with peer towards shared goal.  Patient will identify skill used to make activity successful.  Patient will identify how skills used during activity can be used to reach post d/c goals.   Intervention: STEM Activity   Activity: [employer]. In teams, patients were asked to build the tallest freestanding tower possible out of 15 pipe cleaners. Systematically resources were removed, for example patient ability to use both hands and patient ability to verbally communicate.    Education: Education officer, community, Dentist.   Education Outcome: Needs additional education.   Clinical Observations/Feedback: Pt did not attend group.   Victorino Sparrow, LRT/CTRS         Victorino Sparrow A 02/05/2016 12:34 PM

## 2016-02-05 NOTE — Progress Notes (Signed)
D: Pt. is up and visible in the room, sleeping at this time. Denies having any SI/HI and AVH at this time. Rates pain as 0/10. Pt. presents with a depressed mood and affect and remains isolative to her room.   A: Encouragement and support given. No meds. ordered at this time.   R: Safety maintained with Q 15 checks. Continues to follow treatment plan and will monitor closely.

## 2016-02-05 NOTE — [facility] Group Notes (Signed)
Type of Therapy:  Group therapy  Participation Level:  Active  Participation Quality:  Attentive  Affect:  Flat  Cognitive:  Oriented  Insight:  Limited  Engagement in Therapy:  Limited  Modes of Intervention:  Discussion, Socialization  Summary of Progress/Problems: Isabella [name] was invited, but chose not to attend.    Chaplain was here to lead a group on themes of hope and courage.

## 2016-02-06 NOTE — Progress Notes (Signed)
DAR NOTE: Patient mood and affect remained depressed.  Patient continues to isolate herself.  Patient encouraged to go for meals and attend groups.  Denies pain, auditory and visual hallucinations.  Rates depression at 0, hopelessness at 0, and anxiety at 0.  Maintained on routine safety checks.  Medications given as prescribed.  Support and encouragement offered as needed.  Attended group and participated.   Minimal interaction with staff.  Offered no complaint.

## 2016-02-06 NOTE — Progress Notes (Signed)
Adult Psychoeducational Group Note  Date:  02/06/2016 Time:  12:34 PM  Group Topic/Focus:  Making Healthy Choices:   The focus of this group is to help patients identify negative/unhealthy choices they were using prior to admission and identify positive/healthier coping strategies to replace them upon discharge.   Participation Level:  Active  Participation Quality:  Appropriate and Attentive  Affect:  Appropriate  Cognitive:  Alert and Appropriate  Insight: Appropriate and Good  Engagement in Group:  Engaged  Modes of Intervention:  Discussion and Education   Mart Piggs 02/06/2016, 12:34 PM

## 2016-02-06 NOTE — Progress Notes (Signed)
[facility] MD Progress Note  02/06/2016 2:02 PM Isabella [name]  MRN:  [id number] Subjective: Patient reports " I am still dizzy , but I feel better now."     Objective::Isabella [name] a 40 y.o.hispanic female, who has a hx of schizophrenia , who is single , lives in [place], [place] who presented voluntarily with mother for increased catatonic sx .   I have reviewed chart notes and have met with patient . Patient seen today as less depressed or anxious , and does not seem to be too focussed on her dizziness . Pt per staff has been compliant on her medications , is more visible in milieu . Pt however continues to need a lot of encouragement to stay out of her bed . Will continue to encourage and support.     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  Past Medical History: denies Family History: denies hx of Thyroid disease, HTN, DM. Family History  Problem Relation Age of Onset  . Mental illness Neg Hx    Family Psychiatric  History:denies Social History: Please see H&P.  History  Alcohol Use No     History  Drug Use No    Social History   Social History  . Marital status: Single    Spouse name: N/A  . Number of children: N/A  . Years of education: N/A   Social History Main Topics  . Smoking status: Never Smoker  . Smokeless tobacco: Never Used  . Alcohol use No  . Drug use: No  . Sexual activity: Not Asked   Other Topics Concern  . None   Social History Narrative  . None   Additional Social History:    Pain Medications: No abuse reported. Prescriptions: No abuse reported. Mom reports compliance with all medications. Over the Counter: No abuse reported. History of alcohol / drug use?: No history of alcohol /  drug abuse  Sleep: improved   Appetite:  improving  Current Medications: Current Facility-Administered Medications  Medication Dose Route Frequency Provider Last Rate Last Dose  . acetaminophen (TYLENOL) tablet 650 mg  650 mg Oral Q6H PRN Dara Hoyer, PA-C   650 mg at 01/27/16 0955  . alum & mag hydroxide-simeth (MAALOX/MYLANTA) 200-200-20 MG/5ML suspension 30 mL  30 mL Oral Q4H PRN Dara Hoyer, PA-C      . benztropine (COGENTIN) tablet 1 mg  1 mg Oral QPM Ursula Alert, MD   1 mg at 02/05/16 1726  . feeding supplement (ENSURE ENLIVE) (ENSURE ENLIVE) liquid 237 mL  237 mL Oral BID BM Dara Hoyer, PA-C   237 mL at 02/06/16 0849  . haloperidol (HALDOL) tablet 10 mg  10 mg Oral QPM Amberleigh Gerken, MD   10 mg at 02/05/16 1726  . hydrOXYzine (ATARAX/VISTARIL) tablet 25 mg  25 mg Oral Q4H PRN Dara Hoyer, PA-C   25 mg at 01/28/16 4010  . Influenza vac split quadrivalent PF (FLUARIX) injection 0.5 mL  0.5 mL Intramuscular Tomorrow-1000 Krissa Utke, MD      . risperiDONE (RISPERDAL M-TABS) disintegrating tablet 2 mg  2 mg Oral Q8H PRN Ursula Alert, MD       And  . LORazepam (ATIVAN) tablet 1 mg  1 mg Oral PRN Ursula Alert, MD  And  . ziprasidone (GEODON) injection 20 mg  20 mg Intramuscular PRN  , MD      . magnesium hydroxide (MILK OF MAGNESIA) suspension 30 mL  30 mL Oral Daily PRN Charles E Kober, PA-C      . venlafaxine XR (EFFEXOR-XR) 24 hr capsule 75 mg  75 mg Oral Q breakfast  , MD   75 mg at 02/06/16 0849  . zolpidem (AMBIEN) tablet 5 mg  5 mg Oral QHS PRN  , MD        Lab Results:  No results found for this or any previous visit (from the past 48 hour(s)).  Blood Alcohol level:  Lab Results  Component Value Date   ETH <5 01/23/2016    Metabolic Disorder Labs: Lab Results  Component Value Date   HGBA1C 5.4 01/31/2016   MPG 108 01/31/2016   Lab Results  Component Value Date   PROLACTIN 124.2 (H) 01/31/2016    Lab Results  Component Value Date   CHOL 152 01/31/2016   TRIG 147 01/31/2016   HDL 39 (L) 01/31/2016   CHOLHDL 3.9 01/31/2016   VLDL 29 01/31/2016   LDLCALC 84 01/31/2016    Physical Findings: AIMS: Facial and Oral Movements Muscles of Facial Expression: None, normal Lips and Perioral Area: None, normal Jaw: None, normal Tongue: None, normal,Extremity Movements Upper (arms, wrists, hands, fingers): None, normal Lower (legs, knees, ankles, toes): None, normal, Trunk Movements Neck, shoulders, hips: None, normal, Overall Severity Severity of abnormal movements (highest score from questions above): None, normal Incapacitation due to abnormal movements: None, normal Patient's awareness of abnormal movements (rate only patient's report): No Awareness, Dental Status Current problems with teeth and/or dentures?: No Does patient usually wear dentures?: No  CIWA:    COWS:     Musculoskeletal: Strength & Muscle Tone: within normal limits Gait & Station: normal Patient leans: N/A  Psychiatric Specialty Exam: Physical Exam  Nursing note and vitals reviewed.   Review of Systems  Neurological: Positive for dizziness.  Psychiatric/Behavioral: Positive for depression. The patient is nervous/anxious.   All other systems reviewed and are negative.   Blood pressure 103/72, pulse 94, temperature 97.5 F (36.4 C), temperature source Oral, resp. rate 18, height 5' 4" (1.626 m), weight 65.3 kg (144 lb), last menstrual period 01/03/2016, SpO2 99 %.Body mass index is 24.72 kg/m.  General Appearance: Fairly Groomed  Eye Contact:  Fair  Speech:  Slow  Volume:  Decreased  Mood:  depressed - improving, anxious -improving  Affect:  Congruent  Thought Process:   Linear   Orientation:   Alert and attentive   Thought Content:   Currently denies hallucinations, and does not appear internally preoccupied   Suicidal Thoughts:  No- at this time denies suicidal plan or intention , also denies  violent or homicidal ideations   Homicidal Thoughts:  No  Memory:  Recent and remote fair , immediate - fair  Judgement:  Fair  Insight:  Fair  Psychomotor Activity:  Decreased  Concentration:  Concentration: Fair and Attention Span: Fair  Recall:  Fair  Fund of Knowledge:  Fair  Language:  Fair  Akathisia:  No  Handed:  Right  AIMS (if indicated):     Assets:  Desire for Improvement  ADL's:  Impaired  Cognition:  WNL  Sleep:  Number of Hours: 6.5     Assessment -:Isabella Torresis a 39 y.o.female, who has a hx of schizophrenia , who is single , lives in [place], [place] who presented   voluntarily with mother for increased catatonic sx .Pt with some overall improvement , however , continues to have dizziness this AM, although improving .   Daily contact with patient to assess and evaluate symptoms and progress in treatment and Medication management Changed Haldol to 10 mg po qpm for psychosis. Pt did well on the same in the past.Dose readjusted since pt is dizzy. Will continue Cogentin    1 mg po qpm  for eps. Will continue Effexor xr 75 mg po daily for affective sx.  Discontinued Ativan due to her dizziness. Discontinued Trazodone and changed it to Ambien for sleep. Will encourage PO fluids . Will discontinue Lisinopril due to low BP . Will monitor BP .BP continues to be low. Hospitalist consult for dizziness- pls see consult note - pt to follow up with PCP.Reviewed EKG - premature atrial complexes , otherwise normal ecg. Will continue to monitor vitals ,medication compliance and treatment side effects while patient is here.  Will monitor for medical issues as well as call consult as needed.  CSW will continue working on disposition.  Patient to participate in therapeutic milieu .  ,, MD 02/06/2016, 2:02 PM 

## 2016-02-06 NOTE — [facility] Group Notes (Signed)
[place] LCSW Group Therapy  02/06/2016 11:30 AM  Type of Therapy:  Group Therapy  Participation Level:  Minimal  Participation Quality:  Appropriate  Affect:  Appropriate  Cognitive:  Oriented  Insight:  Limited  Engagement in Therapy:  Engaged  Modes of Intervention:  Discussion  Summary of Progress/Problems: Discussed with group how a schedule can be helpful tool in life management. Discussed how a routine can help to maintain both mental and emotional balance during challenging times. Discussed how the lack of a routine can lead to disorganization and lack productivity. Primary issues that will challenges a routine is the follow through and awareness of distractions like people, trust and situations that would interfere and even support quitting the routine. Group also discussed how to stay on track in order support maintaining a routine. Patient arrived for the last minute of group and did not participate.  Christene Lye 02/06/2016

## 2016-02-06 NOTE — Progress Notes (Signed)
Did not attend groups 

## 2016-02-07 NOTE — Progress Notes (Signed)
Adult Psychoeducational Group Note  Date:  02/07/2016 Time:  1:15 PM  Group Topic/Focus:  Making Healthy Choices:   The focus of this group is to help patients identify negative/unhealthy choices they were using prior to admission and identify positive/healthier coping strategies to replace them upon discharge.   Participation Level:  Active  Participation Quality:  Appropriate and Attentive  Affect:  Appropriate  Cognitive:  Alert and Appropriate  Insight: Appropriate and Good  Engagement in Group:  Engaged  Modes of Intervention:  Discussion and Education   Mart Piggs 02/07/2016, 1:15 PM

## 2016-02-07 NOTE — Progress Notes (Signed)
[facility] MD Progress Note  02/07/2016 12:27 PM Isabella [name]  MRN:  [id number] Subjective: Patient reports " I am still dizzy sometimes, but  I am ok."     Objective::Isabella Valenciais a 40 y.o.hispanic female, who has a hx of schizophrenia , who is single , lives in [place], [place] who presented voluntarily with mother for increased catatonic sx .   I have reviewed chart notes and have met with patient . Patient seen today as less depressed or anxious , and does not seem to be too focussed on her dizziness . Pt per staff has been compliant on her medications . Pt however continues to need a lot of encouragement to stay out of her bed . Will continue to encourage and support.     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  Past Medical History: denies Family History: denies hx of Thyroid disease, HTN, DM. Family History  Problem Relation Age of Onset  . Mental illness Neg Hx    Family Psychiatric  History:denies Social History: Please see H&P.  History  Alcohol Use No     History  Drug Use No    Social History   Social History  . Marital status: Single    Spouse name: N/A  . Number of children: N/A  . Years of education: N/A   Social History Main Topics  . Smoking status: Never Smoker  . Smokeless tobacco: Never Used  . Alcohol use No  . Drug use: No  . Sexual activity: Not Asked   Other Topics Concern  . None   Social History Narrative  . None   Additional Social History:    Pain Medications: No abuse reported. Prescriptions: No abuse reported. Mom reports compliance with all medications. Over the Counter: No abuse reported. History of alcohol / drug use?: No history of alcohol / drug abuse  Sleep:  improved   Appetite:  improving  Current Medications: Current Facility-Administered Medications  Medication Dose Route Frequency Provider Last Rate Last Dose  . acetaminophen (TYLENOL) tablet 650 mg  650 mg Oral Q6H PRN Dara Hoyer, PA-C   650 mg at 01/27/16 0955  . alum & mag hydroxide-simeth (MAALOX/MYLANTA) 200-200-20 MG/5ML suspension 30 mL  30 mL Oral Q4H PRN Dara Hoyer, PA-C      . benztropine (COGENTIN) tablet 1 mg  1 mg Oral QPM Ursula Alert, MD   1 mg at 02/06/16 1707  . feeding supplement (ENSURE ENLIVE) (ENSURE ENLIVE) liquid 237 mL  237 mL Oral BID BM Dara Hoyer, PA-C   237 mL at 02/07/16 1133  . haloperidol (HALDOL) tablet 10 mg  10 mg Oral QPM Jonael Paradiso, MD   10 mg at 02/06/16 1706  . hydrOXYzine (ATARAX/VISTARIL) tablet 25 mg  25 mg Oral Q4H PRN Dara Hoyer, PA-C   25 mg at 01/28/16 6659  . Influenza vac split quadrivalent PF (FLUARIX) injection 0.5 mL  0.5 mL Intramuscular Tomorrow-1000 Latifah Padin, MD      . risperiDONE (RISPERDAL M-TABS) disintegrating tablet 2 mg  2 mg Oral Q8H PRN Ursula Alert, MD       And  . LORazepam (ATIVAN) tablet 1 mg  1 mg Oral PRN Ursula Alert, MD       And  .  ziprasidone (GEODON) injection 20 mg  20 mg Intramuscular PRN Ursula Alert, MD      . magnesium hydroxide (MILK OF MAGNESIA) suspension 30 mL  30 mL Oral Daily PRN Dara Hoyer, PA-C      . venlafaxine XR (EFFEXOR-XR) 24 hr capsule 75 mg  75 mg Oral Q breakfast Giovani Neumeister, MD   75 mg at 02/07/16 6314  . zolpidem (AMBIEN) tablet 5 mg  5 mg Oral QHS PRN Ursula Alert, MD        Lab Results:  No results found for this or any previous visit (from the past 48 hour(s)).  Blood Alcohol level:  Lab Results  Component Value Date   ETH <5 97/06/6376    Metabolic Disorder Labs: Lab Results  Component Value Date   HGBA1C 5.4 01/31/2016   MPG 108 01/31/2016   Lab Results  Component Value Date   PROLACTIN 124.2 (H) 01/31/2016   Lab Results   Component Value Date   CHOL 152 01/31/2016   TRIG 147 01/31/2016   HDL 39 (L) 01/31/2016   CHOLHDL 3.9 01/31/2016   VLDL 29 01/31/2016   LDLCALC 84 01/31/2016    Physical Findings: AIMS: Facial and Oral Movements Muscles of Facial Expression: None, normal Lips and Perioral Area: None, normal Jaw: None, normal Tongue: None, normal,Extremity Movements Upper (arms, wrists, hands, fingers): None, normal Lower (legs, knees, ankles, toes): None, normal, Trunk Movements Neck, shoulders, hips: None, normal, Overall Severity Severity of abnormal movements (highest score from questions above): None, normal Incapacitation due to abnormal movements: None, normal Patient's awareness of abnormal movements (rate only patient's report): No Awareness, Dental Status Current problems with teeth and/or dentures?: No Does patient usually wear dentures?: No  CIWA:    COWS:     Musculoskeletal: Strength & Muscle Tone: within normal limits Gait & Station: normal Patient leans: N/A  Psychiatric Specialty Exam: Physical Exam  Nursing note and vitals reviewed.   Review of Systems  Neurological: Positive for dizziness.  Psychiatric/Behavioral: Positive for depression. The patient is nervous/anxious.   All other systems reviewed and are negative.   Blood pressure 108/82, pulse 97, temperature 98.4 F (36.9 C), temperature source Oral, resp. rate 20, height 5' 4"  (1.626 m), weight 65.3 kg (144 lb), last menstrual period 01/03/2016, SpO2 99 %.Body mass index is 24.72 kg/m.  General Appearance: Fairly Groomed  Eye Contact:  Fair  Speech:  Slow  Volume:  Decreased  Mood:  depressed - improving, anxious -improving  Affect:  Congruent  Thought Process:   Linear   Orientation:   Alert and attentive   Thought Content:   Currently denies hallucinations, and does not appear internally preoccupied   Suicidal Thoughts:  No- at this time denies suicidal plan or intention , also denies violent or  homicidal ideations   Homicidal Thoughts:  No  Memory:  Recent and remote fair , immediate - fair  Judgement:  Fair  Insight:  Fair  Psychomotor Activity:  Decreased  Concentration:  Concentration: Fair and Attention Span: Fair  Recall:  [employer] of Knowledge:  Fair  Language:  Fair  Akathisia:  No  Handed:  Right  AIMS (if indicated):     Assets:  Desire for Improvement  ADL's:  Impaired  Cognition:  WNL  Sleep:  Number of Hours: 6.75     Assessment -:Isabella Valenciais a 40 y.o.female, who has a hx of schizophrenia , who is single , lives in [place], [place] who presented voluntarily with mother  for increased catatonic sx .Pt with some overall improvement , however , continues to have dizziness this AM, although improving .   Daily contact with patient to assess and evaluate symptoms and progress in treatment and Medication management Changed Haldol to 10 mg po qpm for psychosis. Pt did well on the same in the past.Dose readjusted since pt is dizzy. Will continue Cogentin    1 mg po qpm  for eps. Will continue Effexor xr 75 mg po daily for affective sx.  Discontinued Ativan due to her dizziness. Discontinued Trazodone and changed it to Ambien for sleep. Will encourage PO fluids . Will discontinue Lisinopril due to low BP . Will monitor BP .BP continues to be low. Hospitalist consult for dizziness- pls see consult note - pt to follow up with PCP.Reviewed EKG - premature atrial complexes , otherwise normal ecg. Will continue to monitor vitals ,medication compliance and treatment side effects while patient is here.  Will monitor for medical issues as well as call consult as needed.  CSW will continue working on disposition.  Patient to participate in therapeutic milieu .  Isabella Lichty, MD 02/07/2016, 12:27 PM

## 2016-02-07 NOTE — Progress Notes (Signed)
DAR NOTE: Patient presents with flat affect and depressed mood.  Denies pain, auditory and visual hallucinations.  Rates depression at 0, hopelessness at 0, and anxiety at 0.  Maintained on routine safety checks.  Medications given as prescribed.  Support and encouragement offered as needed.  Attended group and participated.  States goal for today is "stay positive and attend groups."   Minimal interaction with staff.  Patient encouraged to attend all groups and be visible in the milieu.

## 2016-02-07 NOTE — B[facility] Group Notes (Signed)
[place] LCSW Group Therapy  02/07/2016   11:00 AM   Type of Therapy:  Group Therapy  Participation Level:  Active  Participation Quality:  Appropriate and Attentive  Affect:  Flat  Cognitive:  Alert and Appropriate  Insight:  Developing/Improving and Engaged  Engagement in Therapy:  Developing/Improving and Engaged  Modes of Intervention:  Clarification, Confrontation, Discussion, Education, Exploration, Limit-setting, Orientation, Problem-solving, Rapport Building, Art therapist, Socialization and Support  Summary of Progress/Problems: The main focus of today's process group was to identify the patient's current support system and decide on other supports that can be put in place.  An emphasis was placed on using counselor, doctor, therapy groups, 12-step groups, and problem-specific support groups to expand supports, as well as doing something different than has been done before. Pt shared that her mom is her support and didn't elaborate.  Pt was quiet throughout group but appeared to actively listen to group discussion.    Theressa Millard, [place] 02/07/2016 3:53 PM

## 2016-02-07 NOTE — Progress Notes (Signed)
Adult Psychoeducational Group Note  Date:  02/07/2016 Time:  9:25 PM  Group Topic/Focus:  Wrap-Up Group:   The focus of this group is to help patients review their daily goal of treatment and discuss progress on daily workbooks.   Participation Level:  Did Not Attend  Additional Comments:  Pt was in her room asleep at the time of group. Clint Bolder 02/07/2016, 9:25 PM

## 2016-02-08 MED ORDER — BENZTROPINE MESYLATE 1 MG PO TABS: 1.0000 mg | ORAL_TABLET | Freq: Every evening | ORAL | 0 refills | Status: DC

## 2016-02-08 MED ORDER — VENLAFAXINE HCL ER 75 MG PO CP24: 75.0000 mg | ORAL_CAPSULE | Freq: Every day | ORAL | 0 refills | Status: DC

## 2016-02-08 MED ORDER — HYDROXYZINE HCL 25 MG PO TABS: 25.0000 mg | ORAL_TABLET | ORAL | 0 refills | Status: DC | PRN

## 2016-02-08 MED ORDER — ZOLPIDEM TARTRATE 5 MG PO TABS: 5.0000 mg | ORAL_TABLET | Freq: Every evening | ORAL | 0 refills | Status: DC | PRN

## 2016-02-08 MED ORDER — HALOPERIDOL 10 MG PO TABS: 10.0000 mg | ORAL_TABLET | Freq: Every evening | ORAL | 0 refills | Status: DC

## 2016-02-08 MED FILL — hydrOXYzine HCL 25 MG TABS: 25 | 10 days supply | Qty: 60 | Fill #0

## 2016-02-08 MED FILL — VENLAFAXINE HCL ER 75 MG CA: 75 | 30 days supply | Qty: 30 | Fill #0

## 2016-02-08 MED FILL — ZOLPIDEM TARTRATE 5 MG TAB: 5 | 7 days supply | Qty: 7 | Fill #0

## 2016-02-08 NOTE — Progress Notes (Signed)
  [facility] Adult Case Management Discharge Plan :  Will you be returning to the same living situation after discharge:  Yes,  home At discharge, do you have transportation home?: Yes,  family Do you have the ability to pay for your medications: Yes,  mental health  Release of information consent forms completed and in the chart;  Patient's signature needed at discharge.  Patient to Follow up at: Follow-up Information    [facility] Follow up on 02/12/2016.   Why:  Friday at 9:00 for your hospital follow up appointment Contact information: [address] [place], [place] [address]  Hours: Mon-Fri 8AM to 5PM  Phone: [phone]  Fax: [phone]          Next level of care provider has access to [employer] and Suicide Prevention discussed: Yes,  yes  Have you used any form of tobacco in the last 30 days? (Cigarettes, Smokeless Tobacco, Cigars, and/or Pipes): No  Has patient been referred to the Quitline?: N/A patient is not a smoker  Patient has been referred for addiction treatment: [place] 02/08/2016, 9:57 AM

## 2016-02-08 NOTE — Plan of Care (Signed)
Problem: Coping: Goal: Ability to cope will improve Outcome: Progressing Pt reports goal is to feel better and "everything will be good".

## 2016-02-08 NOTE — Discharge Summary (Signed)
Physician Discharge Summary Note  Patient:  Isabella [name] is an 40 y.o., female MRN:  [id number] DOB:  1975/05/20 Patient phone:  [phone] (home)  Patient address:   [address] [place] [address],   Total Time spent with patient: Greater than 30 minutes  Date of Admission:  01/24/2016  Date of Discharge: 02-08-16  Reason for Admission: Worsening symptoms of Schizophrenia- Catatonic type.  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 Schizophrenia  Past Medical History: History reviewed. No pertinent past medical history. History reviewed. No pertinent surgical history.  Family History:  Family History  Problem Relation Age of Onset  . Mental illness Neg Hx    Family Psychiatric  History: See H&P  Social History:  History  Alcohol Use No     History  Drug Use No    Social History   Social History  . Marital status: Single    Spouse name: N/A  . Number of children: N/A  . Years of education: N/A   Social History Main Topics  . Smoking status: Never Smoker  . Smokeless tobacco: Never Used  . Alcohol use No  . Drug use: No  . Sexual activity: Not Asked   Other Topics Concern  . None   Social History Narrative  . None   Hospital Course: Isabella [name] an 40 y.o.femalepresenting voluntarily for assessment. Pt is accompanied by mother. [employer] video interpreter was utilized during interview to obtain collateral information from mother. Pt was referred to [facility] by family members due to concerns regarding increased vegetative symptoms. Per intake paperwork completed by family members pt is "unable to get out of bed, daily function is n ot meeting any normal expectations even after the medications. Not eating, low energy, always sleeping, issues with showering". Onset of concerning  symptoms reported to be 13 days ago. Mom and Pt report no physical limitations in regards to performing activities of daily living with the exception of jaw pain and weakness in hands. Mom states pt experiences jaw pain which limits her ability to open her mouth for eating.    Isabella [name] was admitted to the [facility] adult unit for worsening symptoms of Schizophrenia, Catatonic-type. She presented with symptoms that her family described as vegetative in nature & the inability to maintain her adls. She was in need of mood stabilization treatments.  After admission assessment/evaluation, it was determined based on her presenting symptoms that Isabella [name] will benefit from medication regimen to re-stabilize her catatonic symptoms. She was started on the medication regimen that purposefully targeted those symptoms. She was instructed on & explained the benefit/adverse effects of the medication that will be prescribed for her. She was medicated & discharged on; Haldol 10 mg for mood control, Benztropine 1 mg for EPS, Hydroxyzine 25 mg prn for anxiety, Effexor XR 75 mg for depression & Ambien 5 mg for insomnia. She was enrolled & participated in the group counseling sessions being offered and held on this unit. Isabella [name] learned coping skills that should help her after discharge to cope better & maintain mood stability.  Isabella [name]'s symptoms responded well to her treatment regimen. This is evidenced by her reports of improved mood, absence of suicidal ideations, homicidal ideations & or auditory/visual hallucinations. She is currently being discharged to continue psychiatric treatment on an outpatient basis as noted below. She is provided with all the pertinent information required  to make this appointment without problems.   Upon discharge, Isabella [name] adamantly denies any SIHI, AVH, delusional thoughts and or paranoia. She left [facility] with all personal belongings in no apparent distress. She was provided with a 7 days worth, supply samples of  her [facility] discharge medications. Transportation per family.  Physical Findings: AIMS: Facial and Oral Movements Muscles of Facial Expression: None, normal Lips and Perioral Area: None, normal Jaw: None, normal Tongue: None, normal,Extremity Movements Upper (arms, wrists, hands, fingers): None, normal Lower (legs, knees, ankles, toes): None, normal, Trunk Movements Neck, shoulders, hips: None, normal, Overall Severity Severity of abnormal movements (highest score from questions above): None, normal Incapacitation due to abnormal movements: None, normal Patient's awareness of abnormal movements (rate only patient's report): No Awareness, Dental Status Current problems with teeth and/or dentures?: No Does patient usually wear dentures?: No  CIWA:    COWS:     Musculoskeletal: Strength & Muscle Tone: within normal limits Gait & Station: normal Patient leans: N/A  Psychiatric Specialty Exam: Physical Exam  Constitutional: She appears well-developed.  HENT:  Head: Normocephalic.  Eyes: Pupils are equal, round, and reactive to light.  Cardiovascular: Normal rate.   Respiratory: Effort normal.  GI: Soft.  Genitourinary:  Genitourinary Comments: Deffered  Musculoskeletal: Normal range of motion.  Neurological: She is alert.  Skin: Skin is warm.    Review of Systems  Constitutional: Negative.   HENT: Negative.   Eyes: Negative.   Respiratory: Negative.   Cardiovascular: Negative.   Gastrointestinal: Negative.   Genitourinary: Negative.   Musculoskeletal: Negative.   Skin: Negative.   Neurological: Negative.   Endo/Heme/Allergies: Negative.   Psychiatric/Behavioral: Positive for depression (Stable) and hallucinations (Hx. Schizophrenia, catatonic). Negative for memory loss, substance abuse and suicidal ideas. The patient has insomnia (Stable). The patient is not nervous/anxious.     Blood pressure 108/82, pulse 92, temperature 98.1 F (36.7 C), temperature source Oral, resp.  rate 16, height 5\' 4"  (1.626 m), weight 65.3 kg (144 lb), last menstrual period 01/03/2016, SpO2 99 %.Body mass index is 24.72 kg/m.  See Md's SRA  Have you used any form of tobacco in the last 30 days? (Cigarettes, Smokeless Tobacco, Cigars, and/or Pipes): No  Has this patient used any form of tobacco in the last 30 days? (Cigarettes, Smokeless Tobacco, Cigars, and/or Pipes):  No  Blood Alcohol level:  Lab Results  Component Value Date   ETH <5 [id number]   Metabolic Disorder Labs:  Lab Results  Component Value Date   HGBA1C 5.4 01/31/2016   MPG 108 01/31/2016   Lab Results  Component Value Date   PROLACTIN 124.2 (H) 01/31/2016   Lab Results  Component Value Date   CHOL 152 01/31/2016   TRIG 147 01/31/2016   HDL 39 (L) 01/31/2016   CHOLHDL 3.9 01/31/2016   VLDL 29 01/31/2016   LDLCALC 84 01/31/2016   See Psychiatric Specialty Exam and Suicide Risk Assessment completed by Attending Physician prior to discharge.  Discharge destination:  Home  Is patient on multiple antipsychotic therapies at discharge:  No   Has Patient had three or more failed trials of antipsychotic monotherapy by history:  No  Recommended Plan for Multiple Antipsychotic Therapies: NA     Medication List    STOP taking these medications   lisinopril 5 MG tablet Commonly known as:  PRINIVIL,ZESTRIL     TAKE these medications     Indication  benztropine 1 MG tablet Commonly known as:  COGENTIN Take 1 tablet (1 mg total)  by mouth every evening. For prevention of drug induced tremors What changed:  medication strength  how much to take  when to take this  additional instructions  Indication:  Extrapyramidal Reaction caused by Medications   haloperidol 10 MG tablet Commonly known as:  HALDOL Take 1 tablet (10 mg total) by mouth every evening. For mood control What changed:  medication strength  how much to take  when to take this  additional instructions  Indication:  Mood  control   hydrOXYzine 25 MG tablet Commonly known as:  ATARAX/VISTARIL Take 1 tablet (25 mg total) by mouth every 4 (four) hours as needed for anxiety.  Indication:  Anxiety   venlafaxine XR 75 MG 24 hr capsule Commonly known as:  EFFEXOR-XR Take 1 capsule (75 mg total) by mouth daily with breakfast. For depression Start taking on:  02/09/2016  Indication:  Major Depressive Disorder   zolpidem 5 MG tablet Commonly known as:  AMBIEN Take 1 tablet (5 mg total) by mouth at bedtime as needed for sleep.  Indication:  Trouble Sleeping      Follow-up Information    [facility] Follow up on 02/12/2016.   Why:  Friday at 9:00 for your hospital follow up appointment Contact information: [address] [place], [place] [address]  Hours: Mon-Fri 8AM to 5PM  Phone: [phone]  Fax: [phone]         Follow-up recommendations: Activity:  As tolerated Diet: As recommended by your primary care doctor. Keep all scheduled follow-up appointments as recommended.   Comments: Patient is instructed prior to discharge to: Take all medications as prescribed by his/her mental healthcare provider. Report any adverse effects and or reactions from the medicines to his/her outpatient provider promptly. Patient has been instructed & cautioned: To not engage in alcohol and or illegal drug use while on prescription medicines. In the event of worsening symptoms, patient is instructed to call the crisis hotline, 911 and or go to the nearest ED for appropriate evaluation and treatment of symptoms. To follow-up with his/her primary care provider for your other medical issues, concerns and or health care needs.   Signed: Encarnacion Slates, NP, PMHNP, FNP-BC 02/08/2016, 9:29 AM

## 2016-02-08 NOTE — Progress Notes (Signed)
Patient ID: Isabella [name], female   DOB: 06-14-1975, 40 y.o.   MRN: [id number] D: Patient mostly isolated to room all evening. Pt reports goal " make sure everything is good". Medication administered without issue. Denies SI/HI/AVH and pain.No behavioral issues noted.  A: Support and encouragement offered as needed. Medications administered as prescribed.  R: Patient is safe on the unit.

## 2016-02-08 NOTE — Progress Notes (Signed)
Patient discharged to lobby. Patient was stable and appreciative at that time. All papers, samples and prescriptions were given and valuables returned. Verbal understanding expressed. Denies SI/HI and A/VH. Patient given opportunity to express concerns and ask questions.  

## 2016-02-08 NOTE — [facility] Suicide Risk Assessment (Signed)
[facility] Discharge Suicide Risk Assessment   Principal Problem: Schizophrenia, catatonic type [facility]) Discharge Diagnoses:  Patient Active Problem List   Diagnosis Date Noted  . 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   Blood pressure 108/82, pulse 92, temperature 98.1 F (36.7 C), temperature source Oral, resp. rate 16, height 5\' 4"  (1.626 m), weight 65.3 kg (144 lb), last menstrual period 01/03/2016, SpO2 99 %.Body mass index is 24.72 kg/m.  General Appearance: well developed well nourished pleasant calm some PMR and slow gait well groomed  Eye Contact::  Fair  Speech:  [phone]  Volume:  Decreased  Mood:  "good"  Affect:  Blunt  Thought Process:  Coherent  Orientation:  Full (Time, Place, and Person)  Thought Content:  denies AH, VH today  Suicidal Thoughts:  No  Homicidal Thoughts:  No  Memory:  Immediate;   Fair Recent;   Fair Remote;   Fair  Judgement:  Fair  Insight:  Fair  Psychomotor Activity:  some PMR  Concentration:  Fair  Recall:  Good  Fund of Knowledge:Fair  Language: Good  Akathisia:  No  Handed:  Right  AIMS (if indicated):     Assets:  Resilience  Sleep:  Number of Hours: 6.75  Cognition: WNL  ADL's:  Intact   Mental Status Per Nursing Assessment::   On Admission:     Demographic Factors:  NA  Loss Factors: mental health issues  Historical Factors: NA  Risk Reduction Factors:   NA  Continued Clinical Symptoms:  Schizophrenia:   Depressive state  Cognitive Features That Contribute To Risk: Loss of executive function    Suicide Risk:  Mild:  Suicidal ideation of limited frequency, intensity, duration, and specificity.  There are no identifiable plans, no associated intent, mild  dysphoria and related symptoms, good self-control (both objective and subjective assessment), few other risk factors, and identifiable protective factors, including available and accessible social support.  Follow-up Information    [facility] Follow up on 02/09/2016.   Why:  Tuesday at 1:00 for your hospital follow up appointment.  Please note, this was changed from the Friday time that Rod had talked to you about on the phone.  When he called them today, they wanted to see her sooner.  Contact information: [address] [place], [place] [address]  Hours: Mon-Fri 8AM to 5PM  Phone: [phone]  Fax: [phone]          Plan Of Care/Follow-up recommendations:  Other:  Pt with schizophrenia- should continue with medications and outpatient folllow up to maintain stability  Linard Millers, MD 02/08/2016, 10:26 AM

## 2016-07-13 ENCOUNTER — Encounter (HOSPITAL_COMMUNITY): Payer: Self-pay | Admitting: Behavioral Health

## 2016-07-13 ENCOUNTER — Inpatient Hospital Stay (HOSPITAL_COMMUNITY)
Admission: AD | Admit: 2016-07-13 | Discharge: 2016-08-02 | DRG: 885 | Disposition: A | Discharge status: Home / Self Care | Payer: Medicaid Other | Attending: Psychiatry | Admitting: Psychiatry

## 2016-07-13 DIAGNOSIS — Z7982 Long term (current) use of aspirin: Secondary | ICD-10-CM

## 2016-07-13 DIAGNOSIS — R4701 Aphasia: Secondary | ICD-10-CM | POA: Diagnosis not present

## 2016-07-13 DIAGNOSIS — I10 Essential (primary) hypertension: Secondary | ICD-10-CM | POA: Diagnosis present

## 2016-07-13 DIAGNOSIS — L304 Erythema intertrigo: Secondary | ICD-10-CM | POA: Diagnosis not present

## 2016-07-13 DIAGNOSIS — F82 Specific developmental disorder of motor function: Secondary | ICD-10-CM | POA: Diagnosis not present

## 2016-07-13 DIAGNOSIS — F200 Paranoid schizophrenia: Secondary | ICD-10-CM

## 2016-07-13 DIAGNOSIS — F17200 Nicotine dependence, unspecified, uncomplicated: Secondary | ICD-10-CM | POA: Diagnosis present

## 2016-07-13 DIAGNOSIS — Z9183 Wandering in diseases classified elsewhere: Secondary | ICD-10-CM | POA: Diagnosis not present

## 2016-07-13 DIAGNOSIS — F201 Disorganized schizophrenia: Secondary | ICD-10-CM | POA: Diagnosis present

## 2016-07-13 DIAGNOSIS — F202 Catatonic schizophrenia: Principal | ICD-10-CM | POA: Diagnosis present

## 2016-07-13 DIAGNOSIS — F329 Major depressive disorder, single episode, unspecified: Secondary | ICD-10-CM | POA: Diagnosis present

## 2016-07-13 DIAGNOSIS — F411 Generalized anxiety disorder: Secondary | ICD-10-CM | POA: Diagnosis present

## 2016-07-13 DIAGNOSIS — G4700 Insomnia, unspecified: Secondary | ICD-10-CM | POA: Diagnosis not present

## 2016-07-13 DIAGNOSIS — G43909 Migraine, unspecified, not intractable, without status migrainosus: Secondary | ICD-10-CM

## 2016-07-13 DIAGNOSIS — Z79899 Other long term (current) drug therapy: Secondary | ICD-10-CM

## 2016-07-13 DIAGNOSIS — F4010 Social phobia, unspecified: Secondary | ICD-10-CM | POA: Clinically undetermined

## 2016-07-13 HISTORY — DX: Bipolar disorder, unspecified: F31.9

## 2016-07-13 HISTORY — DX: Migraine, unspecified, not intractable, without status migrainosus: G43.909

## 2016-07-13 HISTORY — DX: Essential (primary) hypertension: I10

## 2016-07-13 HISTORY — DX: Schizophrenia, unspecified: F20.9

## 2016-07-13 MED ORDER — ASPIRIN EC 81 MG PO TBEC
81.0000 mg | DELAYED_RELEASE_TABLET | Freq: Every day | ORAL | Status: DC
  Administered 2016-07-14 – 2016-08-02 (×20): 81 mg via ORAL
  Filled 2016-07-13 (×25): qty 1

## 2016-07-13 MED ORDER — HYDRALAZINE HCL 10 MG PO TABS
ORAL_TABLET | ORAL | Status: AC
  Filled 2016-07-13: qty 1

## 2016-07-13 MED ORDER — ENSURE ENLIVE PO LIQD
237.0000 mL | Freq: Two times a day (BID) | ORAL | Status: DC
  Administered 2016-07-14 – 2016-08-01 (×26): 237 mL via ORAL

## 2016-07-13 MED ORDER — HYDROXYZINE HCL 25 MG PO TABS
25.0000 mg | ORAL_TABLET | Freq: Four times a day (QID) | ORAL | Status: DC | PRN
  Administered 2016-07-17: 25 mg via ORAL

## 2016-07-13 MED ORDER — HYDRALAZINE HCL 10 MG PO TABS
10.0000 mg | ORAL_TABLET | Freq: Four times a day (QID) | ORAL | Status: DC
  Filled 2016-07-13 (×2): qty 1

## 2016-07-13 MED ORDER — TRAZODONE HCL 50 MG PO TABS
ORAL_TABLET | ORAL | Status: AC
  Filled 2016-07-13: qty 1

## 2016-07-13 MED ORDER — HYDRALAZINE HCL 10 MG PO TABS: 10.0000 mg | ORAL_TABLET | Freq: Four times a day (QID) | ORAL | Status: DC | PRN

## 2016-07-13 MED ORDER — LURASIDONE HCL 20 MG PO TABS
20.0000 mg | ORAL_TABLET | Freq: Every day | ORAL | Status: DC
  Administered 2016-07-13: 20 mg via ORAL
  Filled 2016-07-13 (×3): qty 1

## 2016-07-13 MED ORDER — MAGNESIUM HYDROXIDE 400 MG/5ML PO SUSP: 30.0000 mL | Freq: Every day | ORAL | Status: DC | PRN

## 2016-07-13 MED ORDER — HYDRALAZINE HCL 10 MG PO TABS
10.0000 mg | ORAL_TABLET | Freq: Once | ORAL | Status: AC
  Administered 2016-07-13: 10 mg via ORAL

## 2016-07-13 MED ORDER — LOSARTAN POTASSIUM 25 MG PO TABS
25.0000 mg | ORAL_TABLET | Freq: Every day | ORAL | Status: DC
  Administered 2016-07-14 – 2016-08-02 (×19): 25 mg via ORAL
  Filled 2016-07-13 (×22): qty 1

## 2016-07-13 MED ORDER — TRAZODONE HCL 50 MG PO TABS
50.0000 mg | ORAL_TABLET | Freq: Every evening | ORAL | Status: DC | PRN
  Administered 2016-07-13: 50 mg via ORAL

## 2016-07-13 MED ORDER — ACETAMINOPHEN 325 MG PO TABS
650.0000 mg | ORAL_TABLET | Freq: Four times a day (QID) | ORAL | Status: DC | PRN
  Administered 2016-07-19: 650 mg via ORAL

## 2016-07-13 MED ORDER — ALUM & MAG HYDROXIDE-SIMETH 200-200-20 MG/5ML PO SUSP: 30.0000 mL | ORAL | Status: DC | PRN

## 2016-07-13 MED ORDER — LURASIDONE HCL 40 MG PO TABS
ORAL_TABLET | ORAL | Status: AC
  Filled 2016-07-13: qty 1

## 2016-07-13 NOTE — BH Assessment (Signed)
Assessment Note  Isabella [name] is a 41 y.o. female who presented to [facility].  She was assessed by TTS staff Ellouise Newer.  Below is a copy of the narrative completed by TTS:  Per Ambel Nodal, ED provider, "PT PRESENTS TODAY VIA EMA FOR AOC SCHIZOPHRENIA.  MOTHER STATES THAT SHE NORMALLY CARES FOR PT, BUT HAS BEEN SICK HERSELF RECENTLY AND BELIEVES THAT PT HAS NOT TAKEN HER MEDICATIONS LATELY.  STATES THAT TODAY, PT WANDERED OUT OF THE HOUSE AND WAS FOUND WANDERING ON THE SIDE OF THE HIGHWAY.  PT STATES SHE WAS 'GOING NOWHERE.'  STATES AH AND STATES SHE 'JUST DOESN'T FEEL GOOD.'  PT STARING OFF IN THE DISTANCE AND DOES NOT ANSWER MOST QUESTIONS.  UNCLEAR IF SHE IS RESPONDING TO INTERNAL STIMULI."  Teleassessment used a Spanish interpreter during interview.  Pt unable to fully participate in assessment due to mental status, made poor eye contact.  She says it is Wednesday in March 2018.  She says she has been depressed, denies SI, HI, AVH, but she appears to be staring into the distance during assessment.  It is possible Pt is responding to internal stimuli.  Pt states that she lives with her parents.  She states that she does not work.  Pt says she has not been taking her Latuda that she gets from [facility], but cannot say why.  Pt denies current stressors and SA.  Alvina Chou recommends IP treatment.  TTS to seek placement.  Diagnosis: Psychotic Disorder, NOS  Past Medical History:  Past Medical History:  Diagnosis Date  . Bipolar disorder ([place])   . Hypertension   . Migraine 07/13/2016  . Schizophrenia ([place])     No past surgical history on file.  Family History:  Family History  Problem Relation Age of Onset  . Mental illness Neg Hx     Social History:  reports that she has been smoking.  She has never used smokeless tobacco. She reports that she does not drink alcohol or use drugs.  Additional Social History:  Alcohol / Drug Use Pain Medications: See PTA Prescriptions: See  PTA Over the Counter: See PTA History of alcohol / drug use?: No history of alcohol / drug abuse  CIWA:   COWS:    Allergies: No Known Allergies  Home Medications:  (Not in a hospital admission)  OB/GYN Status:  No LMP recorded.  General Assessment Data Location of Assessment: [facility] Assessment Services TTS Assessment: Out of system (OOS from [facility]) Is this a Tele or Face-to-Face Assessment?: Tele Assessment Is this an Initial Assessment or a Re-assessment for this encounter?: Initial Assessment Marital status: Single (may live with mother) Is patient pregnant?: No Living Arrangements: Parent Can pt return to current living arrangement?: Yes Admission Status: Other (Comment) (Unknown) Is patient capable of signing voluntary admission?: Yes Referral Source: Self/Family/Friend Insurance type:  MCD     Crisis Care Plan Living Arrangements: Parent Legal Guardian: Other: (Unknown if mother is guardian) Name of Psychiatrist: [facility] Name of Therapist: Unknown  Education Status Is patient currently in school?: No  Risk to self with the past 6 months Suicidal Ideation: Yes-Currently Present Has patient been a risk to self within the past 6 months prior to admission? : Other (comment) (Unknown) Suicidal Intent: No Has patient had any suicidal intent within the past 6 months prior to admission? : Other (comment) (Unknown) Is patient at risk for suicide?: No Suicidal Plan?: No Has patient had any suicidal plan within the past 6  months prior to admission? : Other (comment) (Unknown) Access to Means: No What has been your use of drugs/alcohol within the last 12 months?: Denied Previous Attempts/Gestures:  (Unknown) Other Self Harm Risks: Not medication compliant Family Suicide History: Unable to assess Recent stressful life event(s): Other (Comment) (Financial stressors; not taking medication) Persecutory voices/beliefs?: No Depression: Yes Depression  Symptoms: Despondent (Decreased coping skills) Substance abuse history and/or treatment for substance abuse?: No Suicide prevention information given to non-admitted patients: Not applicable  Risk to Others within the past 6 months Homicidal Ideation: No Does patient have any lifetime risk of violence toward others beyond the six months prior to admission? : No Thoughts of Harm to Others: No Current Homicidal Intent: No Current Homicidal Plan: No Access to Homicidal Means: No History of harm to others?: No Assessment of Violence: None Noted Does patient have access to weapons?: No Criminal Charges Pending?: No Does patient have a court date: No Is patient on probation?: No  Psychosis Hallucinations: Auditory Delusions:  (Unknown)  Mental Status Report Appearance/Hygiene: Other (Comment) (Neat, casual) Eye Contact: Unable to Assess Motor Activity: Unable to assess Speech: Unable to assess Level of Consciousness: Unable to assess Mood: Sad Affect: Constricted, Depressed Anxiety Level:  (UTA) Thought Processes: Unable to Assess Judgement: Impaired Orientation: Place, Time Obsessive Compulsive Thoughts/Behaviors: None  Cognitive Functioning Concentration: Unable to Assess Memory: Remote Intact, Recent Impaired IQ: Average Insight: Poor Impulse Control: Poor Vegetative Symptoms: Unable to Assess  ADLScreening [facility] Services) Patient's cognitive ability adequate to safely complete daily activities?: Yes Patient able to express need for assistance with ADLs?: Yes Independently performs ADLs?: Yes (appropriate for developmental age)  Prior Inpatient Therapy Prior Inpatient Therapy:  (Unknown)  Prior Outpatient Therapy Prior Outpatient Therapy: Yes Prior Therapy Dates: Ongoing Prior Therapy Facilty/Provider(s): [facility] Reason for Treatment: Schizophrenia Does patient have an ACCT team?: Unknown Does patient have Intensive In-House Services?   : Unknown Does patient have [facility] services? : Unknown Does patient have P4CC services?: Unknown  ADL Screening (condition at time of admission) Patient's cognitive ability adequate to safely complete daily activities?: Yes Is the patient deaf or have difficulty hearing?: No Does the patient have difficulty seeing, even when wearing glasses/contacts?: No Does the patient have difficulty concentrating, remembering, or making decisions?: No Patient able to express need for assistance with ADLs?: Yes Does the patient have difficulty dressing or bathing?: No Independently performs ADLs?: Yes (appropriate for developmental age) Does the patient have difficulty walking or climbing stairs?: No Weakness of Legs: None Weakness of Arms/Hands: None  Home Assistive Devices/Equipment Home Assistive Devices/Equipment: None  Therapy Consults (therapy consults require a physician order) PT Evaluation Needed: No OT Evalulation Needed: No SLP Evaluation Needed: No   Values / Beliefs Cultural Requests During Hospitalization: None Spiritual Requests During Hospitalization: None Consults Spiritual Care Consult Needed: No Social Work Consult Needed: No Regulatory affairs officer (For Healthcare) Does Patient Have a Medical Advance Directive?: No    Additional Information 1:1 In Past 12 Months?: No CIRT Risk: No Elopement Risk: No Does patient have medical clearance?: Yes     Disposition:  Disposition Initial Assessment Completed for this Encounter: Yes Disposition of Patient: Inpatient treatment program Type of inpatient treatment program: Adult (Pt accepted to [facility])  On Site Evaluation by:   Reviewed with Physician:    Laurena Slimmer Naomia Lenderman 07/13/2016 6:32 PM

## 2016-07-13 NOTE — Tx Team (Signed)
Initial Treatment Plan 07/13/2016 11:18 PM Isabella [name] [id number]    PATIENT STRESSORS: Marital or family conflict Medication change or noncompliance   PATIENT STRENGTHS: General fund of knowledge Supportive family/friends   PATIENT IDENTIFIED PROBLEMS: Risk for suicide  "sad"  psychosis                 DISCHARGE CRITERIA:  Improved stabilization in mood, thinking, and/or behavior Verbal commitment to aftercare and medication compliance  PRELIMINARY DISCHARGE PLAN: Attend aftercare/continuing care group Outpatient therapy  PATIENT/FAMILY INVOLVEMENT: This treatment plan has been presented to and reviewed with the patient, Isabella [name].  The patient and family have been given the opportunity to ask questions and make suggestions.  Providence Crosby, RN 07/13/2016, 11:18 PM

## 2016-07-14 ENCOUNTER — Encounter (HOSPITAL_COMMUNITY): Payer: Self-pay | Admitting: Psychiatry

## 2016-07-14 DIAGNOSIS — Z79899 Other long term (current) drug therapy: Secondary | ICD-10-CM

## 2016-07-14 DIAGNOSIS — F201 Disorganized schizophrenia: Secondary | ICD-10-CM

## 2016-07-14 MED ORDER — VENLAFAXINE HCL 25 MG PO TABS
25.0000 mg | ORAL_TABLET | Freq: Two times a day (BID) | ORAL | Status: DC
  Administered 2016-07-14 – 2016-07-15 (×2): 25 mg via ORAL
  Filled 2016-07-14 (×6): qty 1

## 2016-07-14 MED ORDER — LURASIDONE HCL 40 MG PO TABS
40.0000 mg | ORAL_TABLET | Freq: Every day | ORAL | Status: DC
  Administered 2016-07-15: 40 mg via ORAL
  Filled 2016-07-14 (×2): qty 1

## 2016-07-14 MED ORDER — LORAZEPAM 0.5 MG PO TABS
0.5000 mg | ORAL_TABLET | Freq: Two times a day (BID) | ORAL | Status: DC
  Administered 2016-07-14 – 2016-07-19 (×11): 0.5 mg via ORAL
  Filled 2016-07-14 (×11): qty 1

## 2016-07-14 NOTE — Progress Notes (Signed)
Recreation Therapy Notes  Date: 07/14/16 Time: 1000 Location: 500 Hall Dayroom  Group Topic: Stress Management  Goal Area(s) Addresses:  Patient will verbalize importance of using healthy stress management.  Patient will identify positive emotions associated with healthy stress management.   Intervention: Stress Management  Activity :  Deep Breathing, Guided Imagery.  LRT introduced the stress management techniques of deep breathing and guided imagery.  LRT read scripts to guide patients through the techniques and explain the purpose of each technique.  Patients were to follow along as the scripts were read to participate in each technique.   Education:  Stress Management, Discharge Planning.   Education Outcome: Acknowledges edcuation/In group clarification offered/Needs additional education  Clinical Observations/Feedback: Pt did not attend group.   Victorino Sparrow, LRT/CTRS         Victorino Sparrow A 07/14/2016 11:51 AM

## 2016-07-14 NOTE — Progress Notes (Signed)
Dar Note: Patient remained in her room most of this shift.  Affect and mood remained depressed.  Food and fluid intake encouraged due to poor appetite.  Patient denies auditory and visual hallucinations.  Patient encouraged to be visible in milieu and to attend group but refused.  Routine safety checks continues.  Patient offered support and encouragement as needed.

## 2016-07-14 NOTE — Progress Notes (Signed)
Did not attend groups 

## 2016-07-14 NOTE — [facility] Group Notes (Signed)
Type of Therapy:  Group Therapy   Participation Level:  Engaged  Participation Quality:  Attentive  Affect:  Appropriate   Cognitive:  Alert   Insight:  Engaged  Engagement in Therapy:  Improving   Mode s of Intervention:  Education, Exploration, Socialization   Summary of Progress/Problems: Invited, chose not to attend.    Tammi from the [facility] was here to tell her story of recovery and inform patients about [facility] and their services.

## 2016-07-14 NOTE — Progress Notes (Signed)
DAR NOTE: Patient presents with depressed affect and mood.  Denies pain, auditory and visual hallucinations.  Maintained on routine safety checks.  Medications given as prescribed.  Support and encouragement offered as needed.  Patient in her room most of this shift sleeping.  Patient encouraged to be visible in milieu.  Patient only stares and responds in monosyllables.  Offered no complaint.

## 2016-07-14 NOTE — BHH Suicide Risk Assessment (Signed)
[facility] Admission Suicide Risk Assessment   Nursing information obtained from:    Demographic factors:    Current Mental Status:    Loss Factors:    Historical Factors:    Risk Reduction Factors:     Total Time spent with patient: 20 minutes Principal Problem: Schizophrenia ([place]) Diagnosis:   Patient Active Problem List   Diagnosis Date Noted  . Schizophrenia ([place]) [F20.9] 07/13/2016  . 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   Continued Clinical Symptoms:  Alcohol Use Disorder Identification Test Final Score (AUDIT): 0 The "Alcohol Use Disorders Identification Test", Guidelines for Use in Primary Care, Second Edition.  World Pharmacologist [facility]). Score between 0-7:  no or low risk or alcohol related problems. Score between 8-15:  moderate risk of alcohol related problems. Score between 16-19:  high risk of alcohol related problems. Score 20 or above:  warrants further diagnostic evaluation for alcohol dependence and treatment.   CLINICAL FACTORS:   Schizophrenia:   Depressive state   Musculoskeletal: Strength & Muscle Tone: within normal limits Gait & Station: normal Patient leans: N/A  Psychiatric Specialty Exam: Physical Exam  ROS  Blood pressure 125/79, pulse (!) 111, temperature 98.9 F (37.2 C), temperature source Oral, resp. rate 18.There is no height or weight on file to calculate BMI.  Please see H&P.                                                         COGNITIVE FEATURES THAT CONTRIBUTE TO RISK:  Closed-mindedness, Polarized thinking and Thought constriction (tunnel vision)    SUICIDE RISK:   Moderate:  Frequent suicidal ideation with limited intensity, and duration, some specificity in terms of plans, no associated intent, good self-control, limited dysphoria/symptomatology, some risk factors present, and identifiable protective factors, including available and accessible  social support.  PLAN OF CARE: Please see H&P.   I certify that inpatient services furnished can reasonably be expected to improve the patient's condition.   Florabel Faulks, MD 07/14/2016, 1:33 PM

## 2016-07-14 NOTE — Progress Notes (Signed)
Admission Note:  D:40 yr female who presents VC in no acute distress for the treatment of  psychosis and Depression. Pt appears flat and depressed, and some psychomotor retardation at times. Pt was calm and cooperative with admission process. Pt presents with passive SI and contracts for safety upon admission. Admission process had to be completed via Interpretor machine. Pt denies AVH . Pt forwards little information, pt stated she could not remember if she has ever been here before or why she was in the hospital. Pt stated she has been sad, but could not remember how long.   A:Skin was assessed by female nurse and found to be clear of any abnormal marks.PT searched and no contraband found, POC and unit policies explained . Consents obtained. Food and fluids offered, and refused.  R: Pt had no additional questions or concerns.

## 2016-07-14 NOTE — BHH Counselor (Signed)
Adult Comprehensive Assessment  Patient ID: Isabella [name], female   DOB: 1975/08/15, 41 y.o.   MRN: [id number]  Information Source: Information source: Patient  Current Stressors:  Employment / Job issues: unemployed, dependent on parents Family Relationships:  Recently mother has been sick, and unable to be as involved in pt's care.  Also, mother reported that father is often gone from home related to his work, and Velen is much more responsive to him re: taking her meds as directed  Living/Environment/Situation:  Living Arrangements: Parent Living conditions (as described by patient or guardian): Pt lives with both parents in [place]. Pt reports this is a good environment.  How long has patient lived in current situation?: 1 year What is atmosphere in current home: Loving, Comfortable, Supportive  Family History:  Marital status: Divorced Divorced, when?: 7-8 years ago What types of issues is patient dealing with in the relationship?: "some problems" Does patient have children?: Yes How many children?: 2 How is patient's relationship with their children?: 56 and 40 year old, don't talk to them too often, they live with their father in [place]  Childhood History:  By whom was/is the patient raised?: Both parents Additional childhood history information: Pt reports having a pretty good childhood.  Description of patient's relationship with caregiver when they were a child: Pt reports getting along well with parents growing up. Patient's description of current relationship with people who raised him/her: Pt reports being close to her parents today.  Did patient suffer any verbal/emotional/physical/sexual abuse as a child?: No Did patient suffer from severe childhood neglect?: No Has patient ever been sexually abused/assaulted/raped as an adolescent or adult?: No Was the patient ever a victim of a crime or a disaster?: No Witnessed domestic violence?: No Has patient been  effected by domestic violence as an adult?: No  Education:  Highest grade of school patient has completed: 10th Currently a Ship broker?: No Learning disability?: No  Employment/Work Situation:  Employment situation: Unemployed Patient's job has been impacted by current illness: No What is the longest time patient has a held a job?: 2-3 years  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 Has patient ever been in the [employer]?: No Has patient ever served in combat?: No Did You Receive Any Psychiatric Treatment/Services While in Passenger transport manager?: No Are There Guns or Other Weapons in [place]?: No  Financial Resources:  Museum/gallery curator resources: Support from parents / caregiver Does patient have a Programmer, applications or guardian?: No  Alcohol/Substance Abuse:  What has been your use of drugs/alcohol within the last 12 months?: Pt denies, but last time pt was here mother indicated she was concerned about pt drinking alcohol-beer Mother also stated patient had been  If attempted suicide, did drugs/alcohol play a role in this?: No Alcohol/Substance Abuse Treatment Hx: Denies past history Has alcohol/substance abuse ever caused legal problems?: No Mother indicates that pt was jailed for 6 years in [place], charges unclear.  Also states that pt was first diagnosed in [place] in 2010.  Social Support System: Patient's Community Support System: Good Describe Community Support System: Pt reports her parents are her main support.  Type of faith/religion: Pt denies How does patient's faith help to cope with current illness?: N/A  Leisure/Recreation:  Leisure and Hobbies: talk with friends  Strengths/Needs:  What things does the patient do well?: talk with family and friends In what areas does patient struggle / problems for patient: don't know  Discharge Plan:  Does patient have access to  transportation?: Yes Will patient be returning to same living situation after  discharge?: Yes Currently receiving community mental health services: Yes (From Whom) ([facility] in [place]) If no, would patient like referral for services when discharged?: Yes (What county?) [facility]) Does patient have financial barriers related to discharge medications?: No    Summary/Recommendations:   Summary and Recommendations (to be completed by the evaluator): Larya is a 41 YO Hispanic female who is diagnosed with Schizophrenia.  She returns to [place] in much the same manner as when she here in September of 17; picked up by police walking down the road.  She has not been attending to ADL's and offers limited verbal interaction.  Initial assessment indicates her mother, with whom she lives, has been sick and so has not been able to offer the same level of care and support ot patient as she had been getting.  Pt has likely not been taking medication.  Rayna can benefit from crises stabilization, medication management, therapeutic milieu and referral for services.  Trish Mage. 07/14/2016

## 2016-07-14 NOTE — Progress Notes (Signed)
Report received from previous RN.  Pt is resting in bed with eyes closed.  Respirations are even and unlabored.  No distress noted.  Will continue to monitor and assess.

## 2016-07-14 NOTE — H&P (Signed)
Psychiatric Admission Assessment Adult  Patient Identification: Isabella [name] MRN:  [id number] Date of Evaluation:  07/14/2016 Chief Complaint: Patient states " I am depressed."  Principal Diagnosis: Schizophrenia ([place]) Diagnosis:   Patient Active Problem List   Diagnosis Date Noted  . Schizophrenia ([place]) [F20.9] 07/13/2016  . Schizophrenia, catatonic type [facility]) [F20.2] 01/24/2016   History of Present Illness: Isabella [name] is a 57 y old hispanic female who lives in [place], [place], who has a hx of schizophrenia, who presented for worsening depression and psychosis.  Patient seen and chart reviewed.Discussed patient with treatment team. Pt is a limited historian , but was able to answer questions asked , but mostly in monosyllables.Hence majority of information obtained from EHR.  Pt today seen as depressed, has severe thought blocking , psychomotor retardation. Pt reported that she stopped her medications months ago and ever since then has been decompensating. Pt reports that she has low energy, lack of concentration and poor appetite. Pt was on effexor and latuda, states she stopped taking them, but wants to be back on it.Pt denies any SI , however is disorganized and has severe thought blocking , seen as staring often, likely responding to internal stimuli.  Pt denies hx of sexual or physical abuse.  Pt denies abusing substances/alcohol.  Pt has a two children , states they are with the father in [place]. Pt is on SSI.  Per initial notes in EHR :  "[place] PT, BUT HAS BEEN SICK HERSELF RECENTLY AND BELIEVES THAT PT HAS NOT TAKEN HER MEDICATIONS LATELY.  STATES THAT TODAY, PT WANDERED OUT OF THE HOUSE AND WAS FOUND WANDERING ON THE SIDE OF THE HIGHWAY.  PT STATES SHE WAS 'GOING NOWHERE.'  STATES AH AND STATES SHE 'JUST DOESN'T FEEL GOOD.'  PT STARING OFF IN THE DISTANCE AND DOES NOT ANSWER MOST QUESTIONS.  UNCLEAR IF SHE IS RESPONDING TO INTERNAL  STIMULI."   Associated Signs/Symptoms: Depression Symptoms:  depressed mood, anhedonia, psychomotor retardation, fatigue, feelings of worthlessness/guilt, difficulty concentrating, hopelessness, (Hypo) Manic Symptoms:  DENIES Anxiety Symptoms:  Unspecfied Psychotic Symptoms:  Hallucinations: staring often, responding to internal stimuli PTSD Symptoms: Negative Total Time spent with patient: 45 minutes  Past Psychiatric History: Pt has had past admissions to [facility], most recently 01/2016. Pt was advised to follow up with [facility] , was not compliant. Pt denies past suicide attempts.  Is the patient at risk to self? Yes.    Has the patient been a risk to self in the past 6 months? Yes.    Has the patient been a risk to self within the distant past? Yes.    Is the patient a risk to others? No.  Has the patient been a risk to others in the past 6 months? No.  Has the patient been a risk to others within the distant past? No.   Prior Inpatient Therapy: Prior Inpatient Therapy:  (Unknown) Prior Outpatient Therapy: Prior Outpatient Therapy: Yes Prior Therapy Dates: Ongoing Prior Therapy Facilty/Provider(s): [facility] Reason for Treatment: Schizophrenia Does patient have an ACCT team?: Unknown Does patient have Intensive In-House Services?  : Unknown Does patient have [facility] services? : Unknown Does patient have P4CC services?: Unknown  Alcohol Screening: 1. How often do you have a drink containing alcohol?: Never 2. How many drinks containing alcohol do you have on a typical day when you are drinking?: 1 or 2 3. How often do you have six or more drinks on one occasion?: Never Preliminary Score:  0 9. Have you or someone else been injured as a result of your drinking?: No 10. Has a relative or friend or a doctor or another health worker been concerned about your drinking or suggested you cut down?: No Alcohol Use Disorder Identification Test Final Score (AUDIT):  0 Brief Intervention: AUDIT score less than 7 or less-screening does not suggest unhealthy drinking-brief intervention not indicated Substance Abuse History in the last 12 months:  No. Consequences of Substance Abuse: Negative Previous Psychotropic Medications: Yes haldol, effexor  Psychological Evaluations: Yes  Past Medical History:  Past Medical History:  Diagnosis Date  . Bipolar disorder ([place])   . Hypertension   . Migraine 07/13/2016  . Schizophrenia ([place])    History reviewed. No pertinent surgical history. Family History:  Family History  Problem Relation Age of Onset  . Mental illness Neg Hx    Family Psychiatric  History: denies Tobacco Screening: Have you used any form of tobacco in the last 30 days? (Cigarettes, Smokeless Tobacco, Cigars, and/or Pipes): No Social History: pt is single, lives with mother in [place], is on SSI. History  Alcohol Use No     History  Drug Use No    Additional Social History: Marital status: Single (may live with mother)    Pain Medications: See PTA Prescriptions: See PTA Over the Counter: See PTA History of alcohol / drug use?: No history of alcohol / drug abuse                    Allergies:  No Known Allergies Lab Results: No results found for this or any previous visit (from the past 48 hour(s)).  Blood Alcohol level:  Lab Results  Component Value Date   ETH <5 [id number]    Metabolic Disorder Labs:  Lab Results  Component Value Date   HGBA1C 5.4 01/31/2016   MPG 108 01/31/2016   Lab Results  Component Value Date   PROLACTIN 124.2 (H) 01/31/2016   Lab Results  Component Value Date   CHOL 152 01/31/2016   TRIG 147 01/31/2016   HDL 39 (L) 01/31/2016   CHOLHDL 3.9 01/31/2016   VLDL 29 01/31/2016   LDLCALC 84 01/31/2016    Current Medications: Current Facility-Administered Medications  Medication Dose Route Frequency Provider Last Rate Last Dose  . acetaminophen (TYLENOL) tablet 650 mg  650 mg Oral Q6H  PRN Ethelene Hal, NP      . alum & mag hydroxide-simeth (MAALOX/MYLANTA) 200-200-20 MG/5ML suspension 30 mL  30 mL Oral Q4H PRN Ethelene Hal, NP      . aspirin EC tablet 81 mg  81 mg Oral Daily Laverle Hobby, PA-C   81 mg at 07/14/16 [id number]  . feeding supplement (ENSURE ENLIVE) (ENSURE ENLIVE) liquid 237 mL  237 mL Oral BID BM Scottlynn Lindell, MD   237 mL at 07/14/16 1126  . hydrALAZINE (APRESOLINE) tablet 10 mg  10 mg Oral Q6H PRN Laverle Hobby, PA-C      . hydrOXYzine (ATARAX/VISTARIL) tablet 25 mg  25 mg Oral Q6H PRN Ethelene Hal, NP      . LORazepam (ATIVAN) tablet 0.5 mg  0.5 mg Oral BID Ursula Alert, MD   0.5 mg at 07/14/16 1126  . losartan (COZAAR) tablet 25 mg  25 mg Oral Daily Laverle Hobby, PA-C   25 mg at 07/14/16 [id number]  . lurasidone (LATUDA) tablet 20 mg  20 mg Oral QHS Ethelene Hal, NP   20 mg at  07/13/16 2243  . magnesium hydroxide (MILK OF MAGNESIA) suspension 30 mL  30 mL Oral Daily PRN Ethelene Hal, NP      . traZODone (DESYREL) tablet 50 mg  50 mg Oral QHS PRN Ethelene Hal, NP   50 mg at 07/13/16 2241  . venlafaxine (EFFEXOR) tablet 25 mg  25 mg Oral BID WC Ursula Alert, MD       PTA Medications: Prescriptions Prior to Admission  Medication Sig Dispense Refill Last Dose  . benztropine (COGENTIN) 1 MG tablet Take 1 tablet (1 mg total) by mouth every evening. For prevention of drug induced tremors 30 tablet 0 Unknown at Unknown time  . haloperidol (HALDOL) 10 MG tablet Take 1 tablet (10 mg total) by mouth every evening. For mood control 30 tablet 0 Unknown at Unknown time  . hydrOXYzine (ATARAX/VISTARIL) 25 MG tablet Take 1 tablet (25 mg total) by mouth every 4 (four) hours as needed for anxiety. 60 tablet 0 Unknown at Unknown time  . venlafaxine XR (EFFEXOR-XR) 75 MG 24 hr capsule Take 1 capsule (75 mg total) by mouth daily with breakfast. For depression 30 capsule 0 Unknown at Unknown time  . zolpidem (AMBIEN) 5 MG tablet  Take 1 tablet (5 mg total) by mouth at bedtime as needed for sleep. 7 tablet 0 Unknown at Unknown time    Musculoskeletal: Strength & Muscle Tone: within normal limits Gait & Station: normal Patient leans: N/A  Psychiatric Specialty Exam: Physical Exam  Review of Systems  Psychiatric/Behavioral: Positive for depression. The patient is nervous/anxious.   All other systems reviewed and are negative.   Blood pressure 125/79, pulse (!) 111, temperature 98.9 F (37.2 C), temperature source Oral, resp. rate 18.There is no height or weight on file to calculate BMI.  General Appearance: Guarded  Eye Contact:  Good  Speech:  Blocked and Slow  Volume:  Decreased  Mood:  Depressed and Dysphoric  Affect:  Depressed  Thought Process:  Disorganized and Descriptions of Associations: Circumstantial  Orientation:  Full (Time, Place, and Person)  Thought Content:  Hallucinations: Seen as responding to internal stimuli  Suicidal Thoughts:  No  Homicidal Thoughts:  No  Memory:  Immediate;   Fair Recent;   Poor Remote;   Poor  Judgement:  Impaired  Insight:  Shallow  Psychomotor Activity:  Decreased  Concentration:  Concentration: Poor and Attention Span: Poor  Recall:  [place] of Knowledge:  Poor  Language:  Fair  Akathisia:  No  Handed:  Right  AIMS (if indicated):     Assets:  Desire for Improvement  ADL's:  Impaired  Cognition:  WNL  Sleep:  Number of Hours: 6.75    Treatment Plan Summary:Patient seen with thought blocking and is disorganized , noncompliant on medications, was wandering on the highways , likely danger to self due to her current mental status. Will start medications and continue treatment. Daily contact with patient to assess and evaluate symptoms and progress in treatment, Medication management and Plan see below Patient will benefit from inpatient treatment and stabilization.  Estimated length of stay is 5-7 days.  Reviewed past medical records,treatment plan.   For schizophrenia: Latuda 40 mg po with meals.                                 For negative sx: Effexor xr 25 mg po bid. Will continue to monitor vitals ,medication compliance and  treatment side effects while patient is here.  Will monitor for medical issues as well as call consult as needed.  Reviewed labs cbc - wnl, cmp - wnl - uds - negative , lipid panel - wnl, PL - elevated - will monitor , will get tsh, hba1c. Will get EKG for qtc monitoring.  CSW will start working on disposition.  Patient to participate in therapeutic milieu .      Observation Level/Precautions:  15 minute checks    Psychotherapy:  Individual and group therapy     Consultations:  CSW  Discharge Concerns:  Stability and safety       Physician Treatment Plan for Primary Diagnosis: Schizophrenia ([place]) Long Term Goal(s): Improvement in symptoms so as ready for discharge  Short Term Goals: Ability to identify changes in lifestyle to reduce recurrence of condition will improve, Ability to verbalize feelings will improve and Compliance with prescribed medications will improve  Physician Treatment Plan for Secondary Diagnosis: Principal Problem:   Schizophrenia ([place])  Long Term Goal(s): Improvement in symptoms so as ready for discharge  Short Term Goals: Ability to identify changes in lifestyle to reduce recurrence of condition will improve, Ability to verbalize feelings will improve and Compliance with prescribed medications will improve  I certify that inpatient services furnished can reasonably be expected to improve the patient's condition.    Ursula Alert, MD 3/1/20181:56 PM

## 2016-07-15 MED ORDER — TRAZODONE HCL 100 MG PO TABS
100.0000 mg | ORAL_TABLET | Freq: Every day | ORAL | Status: DC
  Administered 2016-07-15 – 2016-07-16 (×2): 100 mg via ORAL
  Filled 2016-07-15 (×4): qty 1

## 2016-07-15 MED ORDER — VENLAFAXINE HCL 37.5 MG PO TABS
37.5000 mg | ORAL_TABLET | Freq: Two times a day (BID) | ORAL | Status: DC
  Administered 2016-07-15 – 2016-07-19 (×8): 37.5 mg via ORAL
  Filled 2016-07-15 (×12): qty 1

## 2016-07-15 MED ORDER — LURASIDONE HCL 40 MG PO TABS
40.0000 mg | ORAL_TABLET | Freq: Two times a day (BID) | ORAL | Status: DC
  Administered 2016-07-15 – 2016-07-24 (×18): 40 mg via ORAL
  Filled 2016-07-15 (×22): qty 1

## 2016-07-15 MED ORDER — PROPRANOLOL HCL 10 MG PO TABS
10.0000 mg | ORAL_TABLET | Freq: Two times a day (BID) | ORAL | Status: DC
  Administered 2016-07-16 – 2016-08-02 (×31): 10 mg via ORAL
  Filled 2016-07-15 (×42): qty 1

## 2016-07-15 NOTE — BHH Group Notes (Signed)
[place] LCSW Group Therapy  07/15/2016  1:05 PM  Type of Therapy:  Group therapy  Participation Level:  Active  Participation Quality:  Attentive  Affect:  Flat  Cognitive:  Oriented  Insight:  Limited  Engagement in Therapy:  Limited  Modes of Intervention:  Discussion, Socialization  Summary of Progress/Problems:  Chaplain was here to lead a group on themes of hope and courage.  Invited.  In bed asleep.  Isabella [name] 07/15/2016 1:43 PM

## 2016-07-15 NOTE — Progress Notes (Signed)
Recreation Therapy Notes  Date: 07/15/16 Time: 1000 Location: 500 Hall Dayroom  Group Topic: Self-Esteem  Goal Area(s) Addresses:  Patient will identify positive ways to increase self-esteem. Patient will verbalize benefit of increased self-esteem.  Intervention: Sheet with a blank mask, colored pencils  Activity: How I See Me.  Patiens were given a blank mask.  Patients were to draw a picture of how they see themselves. Once patients were finished with the picture, they were write on the outside of the picture their positive qualities.  Education:  Self-Esteem, Dentist.   Education Outcome: Acknowledges education/In group clarification offered/Needs additional education  Clinical Observations/Feedback: Pt did not attend group.    Victorino Sparrow, LRT/CTRS       Victorino Sparrow A 07/15/2016 12:07 PM

## 2016-07-15 NOTE — Progress Notes (Signed)
Patient has been isolative to her room this shift.  Patient engages minimally with staff even in the presence of the interpreter.  Patient denies SI, HI and AVH this shift.   Patient was encouraged to get OOB to attend groups, but remains isolative to her room.    Assess patient for safety, offer medications as prescribed, engage patient in 1:1 staff talks, encourage patient out of bed.   Continue to monitor as prescribed, patient able to contract for safety.

## 2016-07-15 NOTE — Progress Notes (Signed)
Spanish interpreter has been with patient this morning.  Patient has refused all groups.  Staff has continued to encourage patient to get out of bed and participate.  Patient has been encouraged to go to dining room for meals. Patient denied SI and HI.  Denied A/V hallucinations. Respirations even and unlabored.  No signs/symptoms of pain/distress noted on patient's face/body movements.

## 2016-07-15 NOTE — Progress Notes (Signed)
Recreation Therapy Notes  07/15/16 1315:  LRT went to complete pt assessment.  Pt was in bed and did not respond when called.  LRT will follow up with pt.  Victorino Sparrow, LRT/CTRS     Victorino Sparrow A 07/15/2016 2:34 PM

## 2016-07-15 NOTE — Progress Notes (Signed)
[facility] MD Progress Note  07/15/2016 12:01 PM Isabella [name]  MRN:  [id number] Subjective: Patient seen as mute.  Objective: Patient seen and chart reviewed.Discussed patient with treatment team.  Pt seen with [place] interpreter. Pt stares at Probation officer when Probation officer attempted to evaluate. 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 Pt nodes her head " yes" or no" to questions asked. Pt reports "no" when asked about sleep. Pt denies SI by nodding her head. Will continue to treat.   Principal Problem: Schizophrenia ([place]) Diagnosis:   Patient Active Problem List   Diagnosis Date Noted  . Schizophrenia ([place]) [F20.9] 07/13/2016  . 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   Past Medical History:  Past Medical History:  Diagnosis Date  . Bipolar disorder ([place])   . Hypertension   . Migraine 07/13/2016  . Schizophrenia ([place])    History reviewed. No pertinent surgical history. Family History:  Family History  Problem Relation Age of Onset  . Mental illness Neg Hx    Family Psychiatric  History: Please see H&P.  Social History: Please see H&P.  History  Alcohol Use No     History  Drug Use No    Social History   Social History  . Marital status: Single    Spouse name: N/A  . Number of children: N/A  . Years of education: N/A   Social History Main Topics  . Smoking status: Smoker, Current Status Unknown  . Smokeless tobacco: Never Used  . Alcohol use No  . Drug use: No  . Sexual activity: No   Other Topics Concern  . None   Social History Narrative  . None   Additional Social History:    Pain Medications: See PTA Prescriptions: See PTA Over the Counter: See PTA History of alcohol / drug use?: No history of alcohol / drug abuse                    Sleep: Poor  Appetite:  Fair  Current Medications: Current Facility-Administered Medications   Medication Dose Route Frequency Provider Last Rate Last Dose  . acetaminophen (TYLENOL) tablet 650 mg  650 mg Oral Q6H PRN Ethelene Hal, NP      . alum & mag hydroxide-simeth (MAALOX/MYLANTA) 200-200-20 MG/5ML suspension 30 mL  30 mL Oral Q4H PRN Ethelene Hal, NP      . aspirin EC tablet 81 mg  81 mg Oral Daily Laverle Hobby, PA-C   81 mg at 07/15/16 0846  . feeding supplement (ENSURE ENLIVE) (ENSURE ENLIVE) liquid 237 mL  237 mL Oral BID BM Margit Batte, MD   237 mL at 07/14/16 1126  . hydrALAZINE (APRESOLINE) tablet 10 mg  10 mg Oral Q6H PRN Laverle Hobby, PA-C      . hydrOXYzine (ATARAX/VISTARIL) tablet 25 mg  25 mg Oral Q6H PRN Ethelene Hal, NP      . LORazepam (ATIVAN) tablet 0.5 mg  0.5 mg Oral BID Ursula Alert, MD   0.5 mg at 07/15/16 0849  . losartan (COZAAR) tablet 25 mg  25 mg Oral Daily Laverle Hobby, PA-C   25 mg at 07/15/16 0846  . lurasidone (LATUDA) tablet 40 mg  40 mg Oral BID WC Jenisse Vullo, MD      . magnesium hydroxide (MILK OF MAGNESIA) suspension 30 mL  30 mL Oral Daily PRN Ethelene Hal, NP      .  traZODone (DESYREL) tablet 100 mg  100 mg Oral QHS Eleasha Cataldo, MD      . venlafaxine (EFFEXOR) tablet 37.5 mg  37.5 mg Oral BID WC Ursula Alert, MD        Lab Results: No results found for this or any previous visit (from the past 48 hour(s)).  Blood Alcohol level:  Lab Results  Component Value Date   ETH <5 XX123456    Metabolic Disorder Labs: Lab Results  Component Value Date   HGBA1C 5.4 01/31/2016   MPG 108 01/31/2016   Lab Results  Component Value Date   PROLACTIN 124.2 (H) 01/31/2016   Lab Results  Component Value Date   CHOL 152 01/31/2016   TRIG 147 01/31/2016   HDL 39 (L) 01/31/2016   CHOLHDL 3.9 01/31/2016   VLDL 29 01/31/2016   LDLCALC 84 01/31/2016    Physical Findings: AIMS: Facial and Oral Movements Muscles of Facial Expression: None, normal Lips and Perioral Area: None, normal Jaw: None,  normal Tongue: None, normal,Extremity Movements Upper (arms, wrists, hands, fingers): None, normal Lower (legs, knees, ankles, toes): None, normal, Trunk Movements Neck, shoulders, hips: None, normal, Overall Severity Severity of abnormal movements (highest score from questions above): None, normal Incapacitation due to abnormal movements: None, normal Patient's awareness of abnormal movements (rate only patient's report): No Awareness, Dental Status Current problems with teeth and/or dentures?: No Does patient usually wear dentures?: No  CIWA:  CIWA-Ar Total: 0 COWS:     Musculoskeletal: Strength & Muscle Tone: within normal limits Gait & Station: seen in bed Patient leans: N/A  Psychiatric Specialty Exam: Physical Exam  Nursing note and vitals reviewed.   Review of Systems  Unable to perform ROS: Mental acuity    Blood pressure 105/78, pulse (!) 143, temperature 98.8 F (37.1 C), temperature source Oral, resp. rate 18.There is no height or weight on file to calculate BMI.  General Appearance: Disheveled  Eye Contact:  Poor  Speech:  Blocked and Slow  Volume:  Decreased  Mood:  does not respond  Affect:  Depressed  Thought Process: UTA - is mute  Orientation:  Other:  UTA  Thought Content:  is delusiona, paranoid as observed,  is mute  Suicidal Thoughts:  did not express any , is mute  Homicidal Thoughts:  did not express any  Memory:  UTA  Judgement:  Poor  Insight:  Lacking  Psychomotor Activity:  Decreased  Concentration:  Concentration: Poor and Attention Span: Poor  Recall:  Poor  Fund of Knowledge:  Poor  Language:  Poor  Akathisia:  No  Handed:  Right  AIMS (if indicated):     Assets:  Others:  Acess to healthcare  ADL's:  Impaired  Cognition: grossly intact  Sleep:  Number of Hours: 6.75   Schizophrenia (H[facility]) unstable   Treatment Plan Summary:Patient seen with severe psychomotor retardation, is mute, depressed, paranoid, seen as staring often, will  need continued treatment. Daily contact with patient to assess and evaluate symptoms and progress in treatment, Medication management and Plan see below.   For schizophrenia: Increase Latuda to 40 mg po bid with meals.  For negative/catatonic sx: Ativan 0.5 mg po bid.                                               Increase Effexor to 37.5 mg po bid. For  insomnia: Increase Trazodone to 100 mg po qhs.  Labs reviewed- lipid panel - wnl , Pl - elevated - will monitor closely.  EKG reviewed - nonspecific T wave abnormality, tachycardia.  Patient encouraged to attend groups , participate in milieu.  CSW will continue to work on disposition.   Keontay Vora, MD 07/15/2016, 12:01 PM

## 2016-07-16 DIAGNOSIS — G4700 Insomnia, unspecified: Secondary | ICD-10-CM

## 2016-07-16 MED ORDER — HYDROXYZINE HCL 25 MG PO TABS
ORAL_TABLET | ORAL | Status: AC
Start: 2016-07-16 — End: 2016-07-16
  Administered 2016-07-16: 25 mg
  Filled 2016-07-16: qty 1

## 2016-07-16 NOTE — Progress Notes (Signed)
Patient ID: Isabella [name], female   DOB: 03/02/1976, 41 y.o.   MRN: [id number]   Pt currently presents with a flat affect and guarded, isolative behavior. Interaction with patient his minimal, forwards little. Pt reports good sleep with current medication regimen, does not express any concerns or questions. Performs ADLs tonight, bathing and teeth brushing.    Pt provided with medications per providers orders. Pt's labs and vitals were monitored throughout the night. Pt given a 1:1 about emotional and mental status. Pt supported and encouraged to express concerns and questions. Pt educated on medications. Assessment completed with the help of an interpreter.  Pt's safety ensured with 15 minute and environmental checks. Pt currently denies SI/HI and A/V hallucinations. Pt verbally agrees to seek staff if SI/HI or A/VH occurs and to consult with staff before acting on any harmful thoughts. Will continue POC.

## 2016-07-16 NOTE — [facility] Group Notes (Signed)
[facility] Group Notes: (Clinical Social Work)   07/16/2016      Type of Therapy:  Group Therapy   Participation Level:  Did Not Attend despite MHT prompting   Selmer Dominion, LCSW 07/16/2016, 12:36 PM

## 2016-07-16 NOTE — Progress Notes (Signed)
[facility] MD Progress Note  07/16/2016 5:31 PM Galya Tortorice  MRN:  [id number]  Subjective: Per interpreter: [name] reports; "I don't know"  Objective: Patient seen and chart reviewed.Discussed patient with treatment team.  Pt seen with [place] interpreter. Candid answers to every assessment question is "I don't know". She lying down in her bed. She appears to be in no apparent distress. She is not participating in any of the group sessions today. Will continue to treat.  Principal Problem: Schizophrenia ([place])  Diagnosis:   Patient Active Problem List   Diagnosis Date Noted  . Schizophrenia ([place]) [F20.9] 07/13/2016  . 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  Past Medical History:  Past Medical History:  Diagnosis Date  . Bipolar disorder ([place])   . Hypertension   . Migraine 07/13/2016  . Schizophrenia ([place])    History reviewed. No pertinent surgical history. Family History:  Family History  Problem Relation Age of Onset  . Mental illness Neg Hx    Family Psychiatric  History: Please see H&P.  Social History: Please see H&P.  History  Alcohol Use No     History  Drug Use No    Social History   Social History  . Marital status: Single    Spouse name: N/A  . Number of children: N/A  . Years of education: N/A   Social History Main Topics  . Smoking status: Smoker, Current Status Unknown  . Smokeless tobacco: Never Used  . Alcohol use No  . Drug use: No  . Sexual activity: No   Other Topics Concern  . None   Social History Narrative  . None   Additional Social History:    Pain Medications: See PTA Prescriptions: See PTA Over the Counter: See PTA History of alcohol / drug use?: No history of alcohol / drug abuse  Sleep: Fair  Appetite:  Fair  Current Medications: Current Facility-Administered Medications  Medication Dose Route Frequency Provider Last Rate  Last Dose  . acetaminophen (TYLENOL) tablet 650 mg  650 mg Oral Q6H PRN Ethelene Hal, NP      . alum & mag hydroxide-simeth (MAALOX/MYLANTA) 200-200-20 MG/5ML suspension 30 mL  30 mL Oral Q4H PRN Ethelene Hal, NP      . aspirin EC tablet 81 mg  81 mg Oral Daily Laverle Hobby, PA-C   81 mg at 07/16/16 [id number]  . feeding supplement (ENSURE ENLIVE) (ENSURE ENLIVE) liquid 237 mL  237 mL Oral BID BM Saramma Eappen, MD   237 mL at 07/16/16 1447  . hydrALAZINE (APRESOLINE) tablet 10 mg  10 mg Oral Q6H PRN Laverle Hobby, PA-C      . hydrOXYzine (ATARAX/VISTARIL) tablet 25 mg  25 mg Oral Q6H PRN Ethelene Hal, NP      . LORazepam (ATIVAN) tablet 0.5 mg  0.5 mg Oral BID Ursula Alert, MD   0.5 mg at 07/16/16 1705  . losartan (COZAAR) tablet 25 mg  25 mg Oral Daily Laverle Hobby, PA-C   25 mg at 07/16/16 [id number]  . lurasidone (LATUDA) tablet 40 mg  40 mg Oral BID WC Ursula Alert, MD   40 mg at 07/16/16 1705  . magnesium hydroxide (MILK OF MAGNESIA) suspension 30 mL  30 mL Oral Daily PRN Ethelene Hal, NP      . propranolol (INDERAL) tablet 10 mg  10 mg Oral BID Ursula Alert, MD  10 mg at 07/16/16 1704  . traZODone (DESYREL) tablet 100 mg  100 mg Oral QHS Ursula Alert, MD   100 mg at 07/15/16 2103  . venlafaxine [facility]) tablet 37.5 mg  37.5 mg Oral BID WC Saramma Eappen, MD   37.5 mg at 07/16/16 1705   Lab Results: No results found for this or any previous visit (from the past 48 hour(s)).  Blood Alcohol level:  Lab Results  Component Value Date   ETH <5 [id number]   Metabolic Disorder Labs: Lab Results  Component Value Date   HGBA1C 5.4 01/31/2016   MPG 108 01/31/2016   Lab Results  Component Value Date   PROLACTIN 124.2 (H) 01/31/2016   Lab Results  Component Value Date   CHOL 152 01/31/2016   TRIG 147 01/31/2016   HDL 39 (L) 01/31/2016   CHOLHDL 3.9 01/31/2016   VLDL 29 01/31/2016   LDLCALC 84 01/31/2016   Physical Findings: AIMS: Facial and  Oral Movements Muscles of Facial Expression: None, normal Lips and Perioral Area: None, normal Jaw: None, normal Tongue: None, normal,Extremity Movements Upper (arms, wrists, hands, fingers): None, normal Lower (legs, knees, ankles, toes): None, normal, Trunk Movements Neck, shoulders, hips: None, normal, Overall Severity Severity of abnormal movements (highest score from questions above): None, normal Incapacitation due to abnormal movements: None, normal Patient's awareness of abnormal movements (rate only patient's report): No Awareness, Dental Status Current problems with teeth and/or dentures?: No Does patient usually wear dentures?: No  CIWA:  CIWA-Ar Total: 0 COWS:     Musculoskeletal: Strength & Muscle Tone: within normal limits Gait & Station: seen in bed Patient leans: N/A  Psychiatric Specialty Exam: Physical Exam  Nursing note and vitals reviewed.   Review of Systems  Unable to perform ROS: Mental acuity    Blood pressure 107/65, pulse 85, temperature 98.2 F (36.8 C), temperature source Oral, resp. rate 16.There is no height or weight on file to calculate BMI.  General Appearance: Disheveled  Eye Contact:  Poor  Speech:  Blocked and Slow  Volume:  Decreased  Mood:  does not respond, presents with restricted affects.  Affect:  Depressed  Thought Process: Unable to assess, answers to every questions as "I don't know, via an interpreter.  Orientation:  Other:  Oriented to self.  Thought Content:  Paranoid as observed, monotonic   Suicidal Thoughts:  "I don't know" in a monyonic tone via an interpreter  Homicidal Thoughts:  "I don't know" in a monyonic tone via an interpreter  Memory:  Unable to assess, patient asnwered "I don't know"  Judgement:  Impaired  Insight:  Unable to assess to due patient unable to provide any concrete information.  Psychomotor Activity:  Decreased  Concentration:  Concentration: Poor and Attention Span: Poor  Recall:  Poor  Fund of  Knowledge:  Poor  Language:  Poor  Akathisia:  No  Handed:  Right  AIMS (if indicated):     Assets:  Others:  Acess to healthcare  ADL's:  Impaired  Cognition: grossly intact  Sleep:  Number of Hours: 6.75   Schizophrenia ([place]) Unstable   Treatment Plan Summary: Patient seen with severe psychomotor retardation, answers to every questions in a very low soft voice "I don't know" via her interpreter, depressed, paranoid, seen as staring often, will need continued treatment. Daily contact with patient to assess and evaluate symptoms and progress in treatment, Medication management and Plan see below.   For schizophrenia:  Will continue Latuda 40 mg po bid  with meals.  For negative/catatonic sx:  Will continue Ativan 0.5 mg po bid. Will continue Effexor to 37.5 mg po bid.  For insomnia:  Will continueTrazodone to 100 mg po qhs.  Labs reviewed- lipid panel - wnl , Pl - elevated - will monitor closely.  EKG reviewed - nonspecific T wave abnormality, tachycardia.  Patient encouraged to attend groups , participate in milieu.  CSW will continue to work on disposition.  07-16-16: No changes made on the current plan of care. Will continue as recommended.  Encarnacion Slates, NP, PMHNP, FNP-BC 07/16/2016, 5:31 PMPatient ID: Isabella [name], female   DOB: [birth year], 41 y.o.   MRN: [id number]

## 2016-07-16 NOTE — Progress Notes (Signed)
Patient ID: Isabella [name], female   DOB: 1975/08/11, 41 y.o.   MRN: [id number]   D: Pt lying in bed talking to visitors on approach. Pt mood and affect appeared depressed and flat. With help from interpreter patient reports doing well. Pt denies SI/HI/AVH and pain. Pt was able to shower and take her medication. Cooperative with assessment. No acute distressed noted at this time.   A:  Medications administered as prescribed. Support and encouragement provided to attend groups and engage in milieu. Pt encouraged to discuss feelings and come to staff with any question or concerns.   R: Patient remains safe on the unit.

## 2016-07-16 NOTE — Progress Notes (Signed)
Did not attend. 

## 2016-07-16 NOTE — Plan of Care (Signed)
Problem: Activity: Goal: Interest or engagement in leisure activities will improve Outcome: Progressing Pt able to work on daily role (relapse prevention) with help from interpreter.

## 2016-07-16 NOTE — Progress Notes (Signed)
DAR NOTE: Patient presents with depressed affect and mood.  Denies pain, auditory and visual hallucinations.  Described energy level as low and concentration as low.  Rates depression at 8, hopelessness at 5, and anxiety at 7.  Maintained on routine safety checks.  Medications given as prescribed.  Support and encouragement offered as needed.  States goal for today is "to feel better."  Patient remained in bed most of this shift.  Patient encouraged to get out of bed for therapy and activity but refused.   No interaction wit staff or peers.

## 2016-07-17 MED ORDER — TRAZODONE HCL 150 MG PO TABS
150.0000 mg | ORAL_TABLET | Freq: Every day | ORAL | Status: DC
  Administered 2016-07-17 – 2016-07-20 (×4): 150 mg via ORAL
  Filled 2016-07-17 (×9): qty 1

## 2016-07-17 MED ORDER — HYDROXYZINE HCL 25 MG PO TABS
ORAL_TABLET | ORAL | Status: AC
  Administered 2016-07-17: 25 mg via ORAL
  Filled 2016-07-17: qty 1

## 2016-07-17 NOTE — Progress Notes (Signed)
[facility] MD Progress Note  07/17/2016 2:56 PM Isabella [name]  MRN:  [id number]  Subjective: Per interpreter: [name] reports; "Feel dizzy today. My medicines are helping me a little bit"  Objective: Patient seen and chart reviewed. Discussed patient with treatment team.  Pt seen with [name] interpreter. Blair answers to every assessment question today with simple statement, "I feel dizzy". She lying down in her bed. She appears to be in no apparent distress. She is not participating in any of the group sessions today. She continues to present with restricted affects. She says she is not sleeping well, however, she lays in her bed all day & appears to have been sleeping. Will adjust her sleep medicine.She is encouraged to come out of her to the day roomnfor. She presents as withdrawn.  Principal Problem: Schizophrenia ([place])  Diagnosis:   Patient Active Problem List   Diagnosis Date Noted  . Schizophrenia ([place]) [F20.9] 07/13/2016  . 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  Past Medical History:  Past Medical History:  Diagnosis Date  . Bipolar disorder ([place])   . Hypertension   . Migraine 07/13/2016  . Schizophrenia ([place])    History reviewed. No pertinent surgical history. Family History:  Family History  Problem Relation Age of Onset  . Mental illness Neg Hx    Family Psychiatric  History: Please see H&P.  Social History: Please see H&P.  History  Alcohol Use No     History  Drug Use No    Social History   Social History  . Marital status: Single    Spouse name: N/A  . Number of children: N/A  . Years of education: N/A   Social History Main Topics  . Smoking status: Smoker, Current Status Unknown  . Smokeless tobacco: Never Used  . Alcohol use No  . Drug use: No  . Sexual activity: No   Other Topics Concern  . None   Social History Narrative  . None   Additional  Social History:    Pain Medications: See PTA Prescriptions: See PTA Over the Counter: See PTA History of alcohol / drug use?: No history of alcohol / drug abuse  Sleep: Fair  Appetite:  Fair  Current Medications: Current Facility-Administered Medications  Medication Dose Route Frequency Provider Last Rate Last Dose  . acetaminophen (TYLENOL) tablet 650 mg  650 mg Oral Q6H PRN Ethelene Hal, NP      . alum & mag hydroxide-simeth (MAALOX/MYLANTA) 200-200-20 MG/5ML suspension 30 mL  30 mL Oral Q4H PRN Ethelene Hal, NP      . aspirin EC tablet 81 mg  81 mg Oral Daily Laverle Hobby, PA-C   81 mg at 07/17/16 0802  . feeding supplement (ENSURE ENLIVE) (ENSURE ENLIVE) liquid 237 mL  237 mL Oral BID BM Saramma Eappen, MD   237 mL at 07/17/16 0940  . hydrALAZINE (APRESOLINE) tablet 10 mg  10 mg Oral Q6H PRN Laverle Hobby, PA-C      . hydrOXYzine (ATARAX/VISTARIL) tablet 25 mg  25 mg Oral Q6H PRN Ethelene Hal, NP      . LORazepam (ATIVAN) tablet 0.5 mg  0.5 mg Oral BID Ursula Alert, MD   0.5 mg at 07/17/16 0804  . losartan (COZAAR) tablet 25 mg  25 mg Oral Daily Laverle Hobby, PA-C   25 mg at 07/17/16 0802  . lurasidone (LATUDA) tablet 40 mg  40 mg Oral BID WC Ursula Alert, MD   40 mg at 07/17/16 0802  . magnesium hydroxide (MILK OF MAGNESIA) suspension 30 mL  30 mL Oral Daily PRN Ethelene Hal, NP      . propranolol (INDERAL) tablet 10 mg  10 mg Oral BID Ursula Alert, MD   10 mg at 07/17/16 0802  . traZODone (DESYREL) tablet 100 mg  100 mg Oral QHS Ursula Alert, MD   100 mg at 07/16/16 2125  . venlafaxine [facility]) tablet 37.5 mg  37.5 mg Oral BID WC Saramma Eappen, MD   37.5 mg at 07/17/16 0802   Lab Results: No results found for this or any previous visit (from the past 48 hour(s)).  Blood Alcohol level:  Lab Results  Component Value Date   ETH <5 XX123456   Metabolic Disorder Labs: Lab Results  Component Value Date   HGBA1C 5.4 01/31/2016    MPG 108 01/31/2016   Lab Results  Component Value Date   PROLACTIN 124.2 (H) 01/31/2016   Lab Results  Component Value Date   CHOL 152 01/31/2016   TRIG 147 01/31/2016   HDL 39 (L) 01/31/2016   CHOLHDL 3.9 01/31/2016   VLDL 29 01/31/2016   LDLCALC 84 01/31/2016   Physical Findings: AIMS: Facial and Oral Movements Muscles of Facial Expression: None, normal Lips and Perioral Area: None, normal Jaw: None, normal Tongue: None, normal,Extremity Movements Upper (arms, wrists, hands, fingers): None, normal Lower (legs, knees, ankles, toes): None, normal, Trunk Movements Neck, shoulders, hips: None, normal, Overall Severity Severity of abnormal movements (highest score from questions above): None, normal Incapacitation due to abnormal movements: None, normal Patient's awareness of abnormal movements (rate only patient's report): No Awareness, Dental Status Current problems with teeth and/or dentures?: No Does patient usually wear dentures?: No  CIWA:  CIWA-Ar Total: 1 COWS:  COWS Total Score: 2  Musculoskeletal: Strength & Muscle Tone: within normal limits Gait & Station: seen in bed Patient leans: N/A  Psychiatric Specialty Exam: Physical Exam  Nursing note and vitals reviewed.   Review of Systems  Unable to perform ROS: Mental acuity    Blood pressure 107/65, pulse 85, temperature 98.9 F (37.2 C), temperature source Oral, resp. rate 16.There is no height or weight on file to calculate BMI.  General Appearance: Fairly Groomed  Eye Contact:  Fair  Speech:  Slow  Volume:  Decreased  Mood:  does not respond, presents with restricted affects.  Affect:  Depressed and Restricted  Thought Process: Unable to assess, answers to every questions as "I don't know, via an interpreter.  Orientation:  Other:  Oriented to self.  Thought Content:  Paranoid as observed, monotonic   Suicidal Thoughts:  "I don't know" in a monyonic tone via an interpreter  Homicidal Thoughts:  "I  don't know" in a monyonic tone via an interpreter  Memory:  Unable to assess, patient asnwered "I don't know"  Judgement:  Impaired  Insight:  Unable to assess to due patient unable to provide any concrete information.  Psychomotor Activity:  Decreased  Concentration:  Concentration: Poor and Attention Span: Poor  Recall:  Poor  Fund of Knowledge:  Poor  Language:  Poor  Akathisia:  No  Handed:  Right  AIMS (if indicated):     Assets:  Others:  Acess to healthcare  ADL's:  Impaired  Cognition: grossly intact  Sleep:  Number of Hours: 6.75   Schizophrenia ([place]) Unstable  Treatment Plan Summary: Patient seen with severe psychomotor  retardation, answers to every questions in a very low soft voice "I don't know" via her interpreter, depressed, paranoid, seen as staring often, will need continued treatment. Daily contact with patient to assess and evaluate symptoms and progress in treatment, Medication management and Plan see below.   Will continue today 07/17/16 plan as below except where it is noted.  For schizophrenia:  Will continue Latuda 40 mg po bid with meals.  For negative/catatonic sx:  Will continue Ativan 0.5 mg po bid. Will continue Effexor to 37.5 mg po bid.  For insomnia:  Will increase Trazodone to 150 mg po qhs.  Labs reviewed- lipid panel - wnl , Pl - elevated - will monitor closely.  EKG reviewed - nonspecific T wave abnormality, tachycardia.  Patient encouraged to attend groups , participate in milieu.  CSW will continue to work on disposition.  07-17-16: some changes made on the current plan of care. Will continue as recommended.  Encarnacion Slates, NP, PMHNP, FNP-BC 07/17/2016, 2:56 PMPatient ID: Isabella [name], female   DOB: [birth year], 41 y.o.   MRN: [id number]

## 2016-07-17 NOTE — Progress Notes (Signed)
D:  Patient's self inventory sheet, patient has fair sleep, sleep medication not helpful.  Poor appetite, low energy level, poor concentration.  Rated depression 8, hopeless 5, denied anxiety.  Denied withdrawals.  Denied SI.  Denied physical problems.  Denied pain.  Goal is to eat better.  Will try to eat a little bit better.  No discharge plans. A:  Medications administered per MD orders.  Emotional support and encouragement given patient. R:  Patient denied SI and HI, contracts for safety.  Denied A/V hallucinations.  Safety maintained with 15 minute checks. Patient's inntrepreter came in this morning and was with patient before lunch. Patient's lunch was brought to her as patient would not leave the unit.

## 2016-07-17 NOTE — Progress Notes (Signed)
Patient ID: Isabella [name], female   DOB: 11-Jun-1975, 41 y.o.   MRN: [id number]  Pt currently presents with a flat affect and depressed behavior. Pt states "I am feeling better." Pt reports okay sleep with current medication regimen, difficulty falling asleep. Pt mother and aunt visited with her tonight. Adheres to medication regimen. Improved ADLs including bathing, eating and verbalization of needs to staff. Pt attends group tonight, interacts minimally.   Pt provided with medications per providers orders. Pt's labs and vitals were monitored throughout the night. Pt given a 1:1 about emotional and mental status. Pt supported and encouraged to express concerns and questions. Pt educated on medications and indications.   Pt's safety ensured with 15 minute and environmental checks. Pt currently denies SI/HI and A/V hallucinations. Pt verbally agrees to seek staff if SI/HI or A/VH occurs and to consult with staff before acting on any harmful thoughts. Pt remains isolative, slow to respond. Will not interact with peers or maintain eye contact. Will continue POC.

## 2016-07-17 NOTE — Progress Notes (Signed)
Adult Psychoeducational Group Note  Date:  07/17/2016 Time:  9:19 PM  Group Topic/Focus:  Wrap-Up Group:   The focus of this group is to help patients review their daily goal of treatment and discuss progress on daily workbooks.  Participation Level:  Active  Participation Quality:  Appropriate  Affect:  Appropriate  Cognitive:  Alert  Insight: Appropriate  Engagement in Group:  Engaged  Modes of Intervention:  Discussion  Additional Comments:  With the help of an interpreter, patient expressed that she had a good day. Patient does not have a goal at the moment but will be working toward one.   Corrina Steffensen L Aymara Sassi 07/17/2016, 9:19 PM

## 2016-07-17 NOTE — Plan of Care (Signed)
Problem: Education: Goal: Utilization of techniques to improve thought processes will improve Outcome: Progressing Nurse discussed depression/coping skills with patient.    

## 2016-07-17 NOTE — BHH Group Notes (Signed)
[facility] Group Notes: (Clinical Social Work)   07/17/2016      Type of Therapy:  Group Therapy   Participation Level:  Did Not Attend despite MHT prompting   Selmer Dominion, LCSW 07/17/2016, 1:27 PM

## 2016-07-18 NOTE — BHH Group Notes (Signed)
[facility] Group Notes:  (Counselor/Nursing/MHT/Case Management/Adjunct)  07/18/2016 1:15PM  Type of Therapy:  Group Therapy  Participation Level:  Active  Participation Quality:  Appropriate  Affect:  Flat  Cognitive:  Oriented  Insight:  Improving  Engagement in Group:  Limited  Engagement in Therapy:  Limited  Modes of Intervention:  Discussion, Exploration and Socialization  Summary of Progress/Problems: The topic for group was balance in life.  Pt participated in the discussion about when their life was in balance and out of balance and how this feels.  Pt discussed ways to get back in balance and short term goals they can work on to get where they want to be.  Invited.  Chose to not attend.  Roque Lias B 07/18/2016 2:19 PM

## 2016-07-18 NOTE — Progress Notes (Signed)
[facility] MD Progress Note  07/18/2016 2:02 PM Fleeta Ames  MRN:  [id number]  Subjective:Patient states " I am depressed,anxious, but a little better.'  Objective: Patient seen and chart reviewed.Discussed patient with treatment team. Pt seen in bed, has better eye contact. Her psychomotor retardation is improving, she is able to respond to questions better and her speech is more spontaneous. Pt denies any dizziness today, will continue to readjust medications. Continue to encourage to attend groups and participate.     Principal Problem: Disorganized schizophrenia ([place])  Diagnosis:   Patient Active Problem List   Diagnosis Date Noted  . 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  Past Medical History:  Past Medical History:  Diagnosis Date  . Bipolar disorder ([place])   . Hypertension   . Migraine 07/13/2016  . Schizophrenia ([place])    History reviewed. No pertinent surgical history. Family History:  Family History  Problem Relation Age of Onset  . Mental illness Neg Hx    Family Psychiatric  History: Please see H&P.  Social History: Please see H&P.  History  Alcohol Use No     History  Drug Use No    Social History   Social History  . Marital status: Single    Spouse name: N/A  . Number of children: N/A  . Years of education: N/A   Social History Main Topics  . Smoking status: Smoker, Current Status Unknown  . Smokeless tobacco: Never Used  . Alcohol use No  . Drug use: No  . Sexual activity: No   Other Topics Concern  . None   Social History Narrative  . None   Additional Social History:    Pain Medications: See PTA Prescriptions: See PTA Over the Counter: See PTA History of alcohol / drug use?: No history of alcohol / drug abuse  Sleep: Fair  Appetite:  Fair  Current Medications: Current  Facility-Administered Medications  Medication Dose Route Frequency Provider Last Rate Last Dose  . acetaminophen (TYLENOL) tablet 650 mg  650 mg Oral Q6H PRN Ethelene Hal, NP      . alum & mag hydroxide-simeth (MAALOX/MYLANTA) 200-200-20 MG/5ML suspension 30 mL  30 mL Oral Q4H PRN Ethelene Hal, NP      . aspirin EC tablet 81 mg  81 mg Oral Daily Laverle Hobby, PA-C   81 mg at 07/18/16 0840  . feeding supplement (ENSURE ENLIVE) (ENSURE ENLIVE) liquid 237 mL  237 mL Oral BID BM Kia Stavros, MD   237 mL at 07/18/16 1051  . hydrALAZINE (APRESOLINE) tablet 10 mg  10 mg Oral Q6H PRN Laverle Hobby, PA-C      . hydrOXYzine (ATARAX/VISTARIL) tablet 25 mg  25 mg Oral Q6H PRN Ethelene Hal, NP   25 mg at 07/17/16 2204  . LORazepam (ATIVAN) tablet 0.5 mg  0.5 mg Oral BID Ursula Alert, MD   0.5 mg at 07/18/16 0841  . losartan (COZAAR) tablet 25 mg  25 mg Oral Daily Laverle Hobby, PA-C   25 mg at 07/18/16 0840  . lurasidone (LATUDA) tablet 40 mg  40 mg Oral BID WC Ursula Alert, MD   40 mg at 07/18/16 0840  . magnesium hydroxide (MILK OF MAGNESIA) suspension 30 mL  30 mL Oral Daily PRN Ethelene Hal, NP      . propranolol (INDERAL) tablet 10  mg  10 mg Oral BID Ursula Alert, MD   10 mg at 07/18/16 0840  . traZODone (DESYREL) tablet 150 mg  150 mg Oral QHS Encarnacion Slates, NP   150 mg at 07/17/16 2204  . venlafaxine [facility]) tablet 37.5 mg  37.5 mg Oral BID WC Joanathan Affeldt, MD   37.5 mg at 07/18/16 0840   Lab Results: No results found for this or any previous visit (from the past 48 hour(s)).  Blood Alcohol level:  Lab Results  Component Value Date   ETH <5 [id number]   Metabolic Disorder Labs: Lab Results  Component Value Date   HGBA1C 5.4 01/31/2016   MPG 108 01/31/2016   Lab Results  Component Value Date   PROLACTIN 124.2 (H) 01/31/2016   Lab Results  Component Value Date   CHOL 152 01/31/2016   TRIG 147 01/31/2016   HDL 39 (L) 01/31/2016    CHOLHDL 3.9 01/31/2016   VLDL 29 01/31/2016   LDLCALC 84 01/31/2016   Physical Findings: AIMS: Facial and Oral Movements Muscles of Facial Expression: None, normal Lips and Perioral Area: None, normal Jaw: None, normal Tongue: None, normal,Extremity Movements Upper (arms, wrists, hands, fingers): None, normal Lower (legs, knees, ankles, toes): None, normal, Trunk Movements Neck, shoulders, hips: None, normal, Overall Severity Severity of abnormal movements (highest score from questions above): None, normal Incapacitation due to abnormal movements: None, normal Patient's awareness of abnormal movements (rate only patient's report): No Awareness, Dental Status Current problems with teeth and/or dentures?: No Does patient usually wear dentures?: No  CIWA:  CIWA-Ar Total: 1 COWS:  COWS Total Score: 2  Musculoskeletal: Strength & Muscle Tone: within normal limits Gait & Station: normal Patient leans: N/A  Psychiatric Specialty Exam: Physical Exam  Nursing note and vitals reviewed.   Review of Systems  Psychiatric/Behavioral: Positive for depression. The patient is nervous/anxious.   All other systems reviewed and are negative.   Blood pressure (!) 97/59, pulse 86, temperature 98.1 F (36.7 C), temperature source Oral, resp. rate 18.There is no height or weight on file to calculate BMI.  General Appearance: Fairly Groomed  Eye Contact:  Fair  Speech:  Normal Rate  Volume:  Decreased  Mood:  Anxious, Depressed and Dysphoric, presents with restricted affects.  Affect:  Depressed  Thought Process: linear   Orientation:  Other:  self, situation, place  Thought Content:  Paranoid Ideation  Suicidal Thoughts:  No  Homicidal Thoughts:  No  Memory:  Immediate;   Fair Recent;   Poor Remote;   Poor  Judgement:  Impaired  Insight:  Shallow  Psychomotor Activity:  Decreased  Concentration:  Concentration: Fair and Attention Span: Fair  Recall:  [employer] of Knowledge:  Poor   Language:  Fair  Akathisia:  No  Handed:  Right  AIMS (if indicated):     Assets:  Others:  access to health care  ADL's:  Intact  Cognition: wnl  Sleep:  Number of Hours: 6.75   Disorganized schizophrenia ([place]) unstable - improving  Will continue today 07/18/16 plan as below except where it is noted.   Treatment Plan Summary:Patient today seen as more cooperative , more spontaneous speech , will continue to readjust medications.  Daily contact with patient to assess and evaluate symptoms and progress in treatment, Medication management and Plan see below    For schizophrenia: reviewed Will continue Latuda 40 mg po bid with meals.  For negative/catatonic sx: reviewed as below Will continue Ativan 0.5 mg po  bid. Will continue Effexor to 37.5 mg po bid.  For insomnia: reviewed Trazodone to 150 mg po qhs.  Labs reviewed- lipid panel - wnl , Pl - elevated - will monitor closely.  EKG reviewed - nonspecific T wave abnormality, tachycardia.  Patient encouraged to attend groups , participate in milieu.  CSW will continue to work on disposition.   Kareem Aul, MD 07/18/2016, 2:02 PMPatient ID: [name], female   DOB: [birth year], 41 y.o.   MRN: [id number]

## 2016-07-18 NOTE — Plan of Care (Signed)
Problem: Education: Goal: Utilization of techniques to improve thought processes will improve Outcome: Progressing Nurse discussed depression/coping skills with patient.    

## 2016-07-18 NOTE — Progress Notes (Signed)
Adult Psychoeducational Group Note  Date:  07/18/2016 Time:  10:14 PM  Group Topic/Focus:  Wrap-Up Group:   The focus of this group is to help patients review their daily goal of treatment and discuss progress on daily workbooks.  Participation Level:  Active  Participation Quality:  Appropriate  Affect:  Appropriate  Cognitive:  Alert  Insight: Appropriate  Engagement in Group:  Engaged  Modes of Intervention:  Discussion  Additional Comments:  Patient rated her day a 1. Patient stated that she did not feel well.   Ottilia Pippenger L Sanskriti Greenlaw 07/18/2016, 10:14 PM

## 2016-07-18 NOTE — BHH Counselor (Signed)
With the help of interpreter, tried calling mother who has been visiting regularly.  No answer, and unable to leave message.  Left message on a relative phone to have mother call.

## 2016-07-18 NOTE — Tx Team (Signed)
Interdisciplinary Treatment and Diagnostic Plan Update  07/18/2016 Time of Session: 3:19 PM  Isabella [name] MRN: [id number]  Principal Diagnosis: Disorganized schizophrenia [facility])  Secondary Diagnoses: Principal Problem:   Disorganized schizophrenia ([place])   Current Medications:  Current Facility-Administered Medications  Medication Dose Route Frequency Provider Last Rate Last Dose  . acetaminophen (TYLENOL) tablet 650 mg  650 mg Oral Q6H PRN Ethelene Hal, NP      . alum & mag hydroxide-simeth (MAALOX/MYLANTA) 200-200-20 MG/5ML suspension 30 mL  30 mL Oral Q4H PRN Ethelene Hal, NP      . aspirin EC tablet 81 mg  81 mg Oral Daily Laverle Hobby, PA-C   81 mg at 07/18/16 0840  . feeding supplement (ENSURE ENLIVE) (ENSURE ENLIVE) liquid 237 mL  237 mL Oral BID BM Saramma Eappen, MD   237 mL at 07/18/16 1051  . hydrALAZINE (APRESOLINE) tablet 10 mg  10 mg Oral Q6H PRN Laverle Hobby, PA-C      . hydrOXYzine (ATARAX/VISTARIL) tablet 25 mg  25 mg Oral Q6H PRN Ethelene Hal, NP   25 mg at 07/17/16 2204  . LORazepam (ATIVAN) tablet 0.5 mg  0.5 mg Oral BID Ursula Alert, MD   0.5 mg at 07/18/16 0841  . losartan (COZAAR) tablet 25 mg  25 mg Oral Daily Laverle Hobby, PA-C   25 mg at 07/18/16 0840  . lurasidone (LATUDA) tablet 40 mg  40 mg Oral BID WC Ursula Alert, MD   40 mg at 07/18/16 0840  . magnesium hydroxide (MILK OF MAGNESIA) suspension 30 mL  30 mL Oral Daily PRN Ethelene Hal, NP      . propranolol (INDERAL) tablet 10 mg  10 mg Oral BID Ursula Alert, MD   10 mg at 07/18/16 0840  . traZODone (DESYREL) tablet 150 mg  150 mg Oral QHS Encarnacion Slates, NP   150 mg at 07/17/16 2204  . venlafaxine [facility]) tablet 37.5 mg  37.5 mg Oral BID WC Saramma Eappen, MD   37.5 mg at 07/18/16 0840    PTA Medications: Prescriptions Prior to Admission  Medication Sig Dispense Refill Last Dose  . lurasidone (LATUDA) 40 MG TABS tablet Take 40 mg by mouth every evening.    Past Month at Unknown time  . meclizine (ANTIVERT) 25 MG tablet Take 25 mg by mouth 3 (three) times daily as needed for dizziness.   Past Month at Unknown time    Treatment Modalities: Medication Management, Group therapy, Case management,  1 to 1 session with clinician, Psychoeducation, Recreational therapy.   Physician Treatment Plan for Primary Diagnosis: Disorganized schizophrenia ([place]) Long Term Goal(s): Improvement in symptoms so as ready for discharge  Short Term Goals: Ability to identify changes in lifestyle to reduce recurrence of condition will improve Ability to verbalize feelings will improve Compliance with prescribed medications will improve  Medication Management: Evaluate patient's response, side effects, and tolerance of medication regimen.  Therapeutic Interventions: 1 to 1 sessions, Unit Group sessions and Medication administration.  Evaluation of Outcomes: Progressing  Physician Treatment Plan for Secondary Diagnosis: Principal Problem:   Disorganized schizophrenia ([place])   Long Term Goal(s): Improvement in symptoms so as ready for discharge  Short Term Goals: Ability to identify changes in lifestyle to reduce recurrence of condition will improve Ability to verbalize feelings will improve Compliance with prescribed medications will improve   Medication Management: Evaluate patient's response, side effects, and tolerance of medication regimen.  Therapeutic Interventions: 1 to 1 sessions,  Unit Group sessions and Medication administration.  Evaluation of Outcomes: Progressing   RN Treatment Plan for Primary Diagnosis: Disorganized schizophrenia ([place]) Long Term Goal(s): Knowledge of disease and therapeutic regimen to maintain health will improve  Short Term Goals: Ability to participate in decision making will improve and Compliance with prescribed medications will improve  Medication Management: RN will administer medications as ordered by provider, will  assess and evaluate patient's response and provide education to patient for prescribed medication. RN will report any adverse and/or side effects to prescribing provider.  Therapeutic Interventions: 1 on 1 counseling sessions, Psychoeducation, Medication administration, Evaluate responses to treatment, Monitor vital signs and CBGs as ordered, Perform/monitor CIWA, COWS, AIMS and Fall Risk screenings as ordered, Perform wound care treatments as ordered.  Evaluation of Outcomes: Progressing   Recreational Therapy Treatment Plan for Primary Diagnosis: Disorganized schizophrenia ([place]) Long Term Goal(s): LTG- Patient will participate in recreation therapy tx in at least 2 group sessions without prompting from LRT.  Short Term Goals: Patient will demonstrate increased ability to follow instructions, as demonstrated by ability to follow LRT instructions on first prompt during recreation therapy group sessions.  Treatment Modalities: Group and Pet Therapy  Therapeutic Interventions: Psychoeducation  Evaluation of Outcomes: Progressing   LCSW Treatment Plan for Primary Diagnosis: Disorganized schizophrenia ([place]) Long Term Goal(s): Safe transition to appropriate next level of care at discharge, Engage patient in therapeutic group addressing interpersonal concerns.  Short Term Goals: Engage patient in aftercare planning with referrals and resources  Therapeutic Interventions: Assess for all discharge needs, 1 to 1 time with Social worker, Explore available resources and support systems, Assess for adequacy in community support network, Educate family and significant other(s) on suicide prevention, Complete Psychosocial Assessment, Interpersonal group therapy.  Evaluation of Outcomes: Met  Return home, follow up [place] in Treatment: Attending groups: Yes Participating in groups: Yes Taking medication as prescribed: Yes Toleration medication: Yes, no side effects reported at  this time Family/Significant other contact made: No Patient understands diagnosis:No  Limited insight Discussing patient identified problems/goals with staff: Yes Medical problems stabilized or resolved: Yes Denies suicidal/homicidal ideation: Yes Issues/concerns per patient self-inventory: None Other: N/A  New problem(s) identified: None identified at this time.   New Short Term/Long Term Goal(s): None identified at this time.   Discharge Plan or Barriers:   Reason for Continuation of Hospitalization: Depression Hallucinations Disorganization/[place] Medication stabilization   Estimated Length of Stay: 3-5 days  Attendees: Patient: 07/18/2016  3:19 PM  Physician: Ursula Alert, MD 07/18/2016  3:19 PM  Nursing: Hoy Register, RN 07/18/2016  3:19 PM  RN Care Manager: Lars Pinks, RN 07/18/2016  3:19 PM  Social Worker: Ripley Fraise 07/18/2016  3:19 PM  Recreational Therapist: Laretta Bolster  07/18/2016  3:19 PM  Other: Norberto Sorenson 07/18/2016  3:19 PM  Other:  07/18/2016  3:19 PM    Scribe for Treatment Team:  Roque Lias LCSW 07/18/2016 3:19 PM

## 2016-07-18 NOTE — Progress Notes (Signed)
D:  Patient's self inventory sheet, patient has fair sleep, sleep medication helped alittle.  Fair appetite, low energy level, poor concentration.  Rated depression and hopeless 5, anxiety 6.  Denied withdrawals.  Denied SI.  Denied physical problems.  Denied physical pain.  Goal is to participate in group therapy.  Plans to get out of room.  No discharge plans. A:  Medications administered per MD orders.  Emotional support and encouragement given patient. R:  Denied SI and HI, contracts for safety.  Denied A/V hallucinations.  Safety maintained with 15 minute checks. Interpreter has been with patient this morning and afternoon groups. Patient ate 75% of her breakfast and 50% lunch today.

## 2016-07-18 NOTE — Progress Notes (Signed)
Recreation Therapy Notes  Date: 07/18/16 Time: 1000 Location: 500 Hall Dayroom  Group Topic: Coping Skills  Goal Area(s) Addresses:  Patient will be able to identify positive coping skills. Patient will be able to identify the benefits of positive coping skills. Patient will be able to identify benefits of using coping skills post d/c.  Intervention: Pencils, blank web   Activity: Building surveyor.  Patients were given an blank web worksheet.  Patients were to identify the things that have them "stuck" and write them inside of the web.  Patients were to then identify coping skills to help them deal with the situations they are faced with.   Education: Radiographer, therapeutic, Dentist.   Education Outcome: Acknowledges understanding/In group clarification offered/Needs additional education.   Clinical Observations/Feedback: Pt did not attend group.   Victorino Sparrow, LRT/CTRS         Victorino Sparrow A 07/18/2016 12:38 PM

## 2016-07-18 NOTE — Progress Notes (Signed)
Recreation Therapy Notes  INPATIENT RECREATION THERAPY ASSESSMENT  Patient Details Name: Yubia Scalf MRN: [id number] DOB: 1975-12-03 Today's Date: 07/18/2016  Patient Stressors:  (Pt did not identify stressors.)  Coping Skills:   Isolate, Music  Personal Challenges: Anger, Communication, Expressing Yourself, Relationships  Leisure Interests (2+):   ("I don't know")  Awareness of Community Resources:  Pt did not answer  Patient Strengths:  "I don't know"  Patient Identified Areas of Improvement:  "I don't know"  Current Recreation Participation:  "I don't know"  Patient Goal for Hospitalization:  "I don't know"  [place] of Residence:  [place] of Residence:  [place]  Current [place] (including self-harm):  No  Current HI:  No  Consent to Intern Participation: N/A   Victorino Sparrow, LRT/CTRS   Ria Comment, Madisan Bice A 07/18/2016, 1:03 PM

## 2016-07-19 MED ORDER — LORAZEPAM 0.5 MG PO TABS: 0.5000 mg | ORAL_TABLET | Freq: Every day | ORAL | Status: DC

## 2016-07-19 MED ORDER — VENLAFAXINE HCL 50 MG PO TABS
50.0000 mg | ORAL_TABLET | Freq: Two times a day (BID) | ORAL | Status: DC
  Administered 2016-07-19 – 2016-07-22 (×6): 50 mg via ORAL
  Filled 2016-07-19: qty 1
  Filled 2016-07-19: qty 2
  Filled 2016-07-19 (×7): qty 1
  Filled 2016-07-19: qty 2
  Filled 2016-07-19: qty 1
  Filled 2016-07-19: qty 2
  Filled 2016-07-19: qty 1

## 2016-07-19 MED ORDER — ACETAMINOPHEN 325 MG PO TABS
ORAL_TABLET | ORAL | Status: AC
  Filled 2016-07-19: qty 2

## 2016-07-19 MED ORDER — LIDOCAINE 5 % EX PTCH
1.0000 | MEDICATED_PATCH | Freq: Every day | CUTANEOUS | Status: DC
  Administered 2016-07-19 – 2016-07-31 (×12): 1 via TRANSDERMAL
  Filled 2016-07-19 (×17): qty 1

## 2016-07-19 NOTE — Progress Notes (Signed)
Recreation Therapy Notes  Date: 07/19/16 Time: 1000 Location: 500 Hall Dayroom  Group Topic: Leisure Education  Goal Area(s) Addresses:  Patient will identify positive leisure activities.  Patient will identify one positive benefit of participation in leisure activities.   Behavioral Response: None  Intervention: Various activities, dry erase board, dry erase marker, eraser  Activity: Leisure [place].  Patients were to pull a slip of paper with a leisure activity on from a can.  The patient is to then draw the picture on the board and the remaining patients are to guess what the picture is.  The person guesses correctly, will then get the opportunity to draw another activity on the board.  Education:  Leisure Education, Dentist  Education Outcome: Acknowledges education/In group clarification offered/Needs additional education  Clinical Observations/Feedback:  Pt sat quietly with her interpretor and observed.  Interpretor tried to get pt to participate but she wouldn't but the interpretor was very active during group.    Victorino Sparrow, LRT/CTRS      Victorino Sparrow A 07/19/2016 11:58 AM

## 2016-07-19 NOTE — Progress Notes (Signed)
[facility] MD Progress Note  07/19/2016 3:30 PM Isabella [name]  MRN:  [id number]  Subjective:Patient states " I do not feel so good, I have back pain."   Objective: Patient seen and chart reviewed.Discussed patient with treatment team.  Pt today seen as withdrawn, depressed, , lethargic , with psychomotor retardation. Pt continues to need a lot of encouragement to participate in milieu. Sleep and appetite seems to be improving. Pt also with pain today - lower back , reports hx of the same , will provide symptomatic treatment.     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  Past Medical History:  Past Medical History:  Diagnosis Date  . Bipolar disorder ([place])   . Hypertension   . Migraine 07/13/2016  . Schizophrenia ([place])    History reviewed. No pertinent surgical history. Family History:  Family History  Problem Relation Age of Onset  . Mental illness Neg Hx    Family Psychiatric  History: Please see H&P.  Social History: Please see H&P.  History  Alcohol Use No     History  Drug Use No    Social History   Social History  . Marital status: Single    Spouse name: N/A  . Number of children: N/A  . Years of education: N/A   Social History Main Topics  . Smoking status: Smoker, Current Status Unknown  . Smokeless tobacco: Never Used  . Alcohol use No  . Drug use: No  . Sexual activity: No   Other Topics Concern  . None   Social History Narrative  . None   Additional Social History:    Pain Medications: See PTA Prescriptions: See PTA Over the Counter: See PTA History of alcohol / drug use?: No history of alcohol / drug abuse  Sleep: Fair  Appetite:  Fair  Current Medications: Current Facility-Administered Medications  Medication  Dose Route Frequency Provider Last Rate Last Dose  . acetaminophen (TYLENOL) tablet 650 mg  650 mg Oral Q6H PRN Ethelene Hal, NP   650 mg at 07/19/16 1132  . alum & mag hydroxide-simeth (MAALOX/MYLANTA) 200-200-20 MG/5ML suspension 30 mL  30 mL Oral Q4H PRN Ethelene Hal, NP      . aspirin EC tablet 81 mg  81 mg Oral Daily Laverle Hobby, PA-C   81 mg at 07/19/16 0830  . feeding supplement (ENSURE ENLIVE) (ENSURE ENLIVE) liquid 237 mL  237 mL Oral BID BM Mckinna Demars, MD   237 mL at 07/19/16 1135  . hydrALAZINE (APRESOLINE) tablet 10 mg  10 mg Oral Q6H PRN Laverle Hobby, PA-C      . hydrOXYzine (ATARAX/VISTARIL) tablet 25 mg  25 mg Oral Q6H PRN Ethelene Hal, NP   25 mg at 07/17/16 2204  . lidocaine (LIDODERM) 5 % 1 patch  1 patch Transdermal Daily Ursula Alert, MD   1 patch at 07/19/16 1344  . LORazepam (ATIVAN) tablet 0.5 mg  0.5 mg Oral BID Ursula Alert, MD   0.5 mg at 07/19/16 0830  . losartan (COZAAR) tablet 25 mg  25 mg Oral Daily Laverle Hobby, PA-C   25 mg at 07/19/16 0830  . lurasidone (LATUDA) tablet 40 mg  40 mg Oral BID WC Onnie Hatchel, MD   40 mg at 07/19/16 0830  .  magnesium hydroxide (MILK OF MAGNESIA) suspension 30 mL  30 mL Oral Daily PRN Ethelene Hal, NP      . propranolol (INDERAL) tablet 10 mg  10 mg Oral BID Ursula Alert, MD   10 mg at 07/19/16 0830  . traZODone (DESYREL) tablet 150 mg  150 mg Oral QHS Encarnacion Slates, NP   150 mg at 07/18/16 2050  . venlafaxine (EFFEXOR) tablet 50 mg  50 mg Oral BID WC Ursula Alert, MD       Lab Results: No results found for this or any previous visit (from the past 48 hour(s)).  Blood Alcohol level:  Lab Results  Component Value Date   ETH <5 [id number]   Metabolic Disorder Labs: Lab Results  Component Value Date   HGBA1C 5.4 01/31/2016   MPG 108 01/31/2016   Lab Results  Component Value Date   PROLACTIN 124.2 (H) 01/31/2016   Lab Results  Component Value Date   CHOL 152 01/31/2016    TRIG 147 01/31/2016   HDL 39 (L) 01/31/2016   CHOLHDL 3.9 01/31/2016   VLDL 29 01/31/2016   LDLCALC 84 01/31/2016   Physical Findings: AIMS: Facial and Oral Movements Muscles of Facial Expression: None, normal Lips and Perioral Area: None, normal Jaw: None, normal Tongue: None, normal,Extremity Movements Upper (arms, wrists, hands, fingers): None, normal Lower (legs, knees, ankles, toes): None, normal, Trunk Movements Neck, shoulders, hips: None, normal, Overall Severity Severity of abnormal movements (highest score from questions above): None, normal Incapacitation due to abnormal movements: None, normal Patient's awareness of abnormal movements (rate only patient's report): No Awareness, Dental Status Current problems with teeth and/or dentures?: No Does patient usually wear dentures?: No  CIWA:  CIWA-Ar Total: 1 COWS:  COWS Total Score: 2  Musculoskeletal: Strength & Muscle Tone: within normal limits Gait & Station: normal Patient leans: N/A  Psychiatric Specialty Exam: Physical Exam  Nursing note and vitals reviewed.   Review of Systems  Musculoskeletal: Positive for back pain.  Psychiatric/Behavioral: Positive for depression. The patient is nervous/anxious.   All other systems reviewed and are negative.   Blood pressure 97/67, pulse 84, temperature 97.7 F (36.5 C), temperature source Oral, resp. rate 18.There is no height or weight on file to calculate BMI.  General Appearance: Fairly Groomed  Eye Contact:  Fair  Speech:  Normal Rate  Volume:  Decreased  Mood:  Anxious, Depressed and Dysphoric, improving slowly  Affect:  Constricted  Thought Process: linear  Orientation:  Other:  situation, place  Thought Content:  Paranoid Ideation  Suicidal Thoughts:  No  Homicidal Thoughts:  No  Memory:  Immediate;   Fair Recent;   Poor Remote;   Poor  Judgement:  Impaired  Insight:  Shallow  Psychomotor Activity:  Decreased  Concentration:  Concentration: Fair and  Attention Span: Fair  Recall:  [employer] of Knowledge:  Poor  Language:  Fair  Akathisia:  No  Handed:  Right  AIMS (if indicated):     Assets:  Physical Health Social Support  ADL's:  Intact  Cognition: wnl  Sleep:  Number of Hours: 6.5   Schizophrenia, catatonic type ([place])  Will continue today 07/19/16 plan as below except where it is noted.   Treatment Plan Summary:Patient today seen as in pain, and also is depressed, although progressing slowly.   Daily contact with patient to assess and evaluate symptoms and progress in treatment, Medication management and Plan see below    For schizophrenia: reviewed  Will  continue Latuda 40 mg po bid with meals.  For negative sx: Will reduce ativan to 0.5 mg po daily, taper off slowly. Will increase Effexor to 50 mg po bid.   For insomnia: reviewed Trazodone to 150 mg po qhs.  Labs reviewed- lipid panel - wnl , Pl - elevated - will monitor closely.  EKG reviewed - nonspecific T wave abnormality, tachycardia.  Patient encouraged to attend groups , participate in milieu.  CSW will continue to work on disposition.   Isabella Biby, MD 07/19/2016, 3:30 PMPatient ID: Isabella [name], female   DOB: 1975-12-06, 41 y.o.   MRN: [id number]

## 2016-07-19 NOTE — Progress Notes (Signed)
Adult Psychoeducational Group Note  Date:  07/19/2016 Time:  8:55 PM  Group Topic/Focus:  Wrap-Up Group:   The focus of this group is to help patients review their daily goal of treatment and discuss progress on daily workbooks.  Participation Level:  Active  Participation Quality:  Appropriate  Affect:  Appropriate  Cognitive:  Alert  Insight: Appropriate  Engagement in Group:  Engaged  Modes of Intervention:  Discussion  Additional Comments:  Patient expressed that she had a good day.   Melodi Happel L Kollin Udell 07/19/2016, 8:55 PM

## 2016-07-19 NOTE — BHH Group Notes (Signed)
[facility] LCSW Group Therapy  07/19/2016 1:15 pm  Type of Therapy: Process Group Therapy  Participation Level:  Active  Participation Quality:  Appropriate  Affect:  Flat  Cognitive:  Oriented  Insight:  Improving  Engagement in Group:  Limited  Engagement in Therapy:  Limited  Modes of Intervention:  Activity, Clarification, Education, Problem-solving and Support  Summary of Progress/Problems: Today's group addressed the issue of overcoming obstacles.  Patients were asked to identify their biggest obstacle post d/c that stands in the way of their on-going success, and then problem solve as to how to manage this. Invited.  Chose to not attend.  Trish Mage 07/19/2016   4:57 PM

## 2016-07-19 NOTE — BHH Suicide Risk Assessment (Addendum)
[facility] INPATIENT:  Family/Significant Other Suicide Prevention Education  Suicide Prevention Education:  Education Completed; Isabella [name], [id number]   has been identified by the patient as the family member/significant other with whom the patient will be residing, and identified as the person(s) who will aid the patient in the event of a mental health crisis (suicidal ideations/suicide attempt).  With written consent from the patient, the family member/significant other has been provided the following suicide prevention education, prior to the and/or following the discharge of the patient.  The suicide prevention education provided includes the following:  Suicide risk factors  Suicide prevention and interventions  National Suicide Hotline telephone number  [facility] assessment telephone number  [name] Emergency Assistance [place] and/or Residential Mobile Crisis Unit telephone number  Request made of family/significant other to:  Remove weapons (e.g., guns, rifles, knives), all items previously/currently identified as safety concern.    Remove drugs/medications (over-the-counter, prescriptions, illicit drugs), all items previously/currently identified as a safety concern.  The family member/significant other verbalizes understanding of the suicide prevention education information provided.  The family member/significant other agrees to remove the items of safety concern listed above.  Also, went over crises plan and treatment team recommendations.  Mother's main concern is that Isabella [name] cannot be left alone.  This is how she ended up leaving the house and walking down the highway, when she was picked up and brought to [place].  At his point, mother is saying that if Isabella [name] comes back to [place] again, she is wanting placement in a [facility].  She confirmed that Isabella [name] is getting disability.  [place] 07/19/2016, 10:00 AM

## 2016-07-19 NOTE — Progress Notes (Signed)
D: Pt was laying in bed prior to the assessment, and her interpretor was present. Pt was reluctant to attend group tonight and it took Careers information officer as well as the interpretor to persuade her. When asked pt denied attending any groups today other than wrap up group. Writer asked pt to promise to attend at least 2 other groups tomorrow, and also informed pt that Probation officer would be escorting her to the cafeteria in the morning. Informed that she could walk with writer to get her breakfast tray and bring it back onto the unit. Pt initially refused but said she "would try".  Pt has no questions or concerns.  A:  Support and encouragement was offered. 15 min checks continued for safety.  R: Pt remains safe.

## 2016-07-19 NOTE — Plan of Care (Signed)
Problem: Education: Goal: Ability to make informed decisions regarding treatment will improve Outcome: Progressing Nurse discussed depression/coping skills with patient.

## 2016-07-19 NOTE — Progress Notes (Signed)
D:  Patient's self inventory sheet, patient has fair sleep.  Fair appetite, low energy level, poor concentration.  Rated depression 5, hopeless 6, anxiety 7.  Denied withdrawals.  Denied physical problem.  Physical pain, worst pain #5 in past 24 hours, upper back.  Goal is to get better.  No discharge plans. A:  Medications administered per MD orders.  Emotional support and encouragement given patient. R:  Patient denied SI and HI.  Denied A/V hallucinations while talking to nurse this morning.

## 2016-07-19 NOTE — Progress Notes (Signed)
Spanish interpreter was with patient this morning and afternoon.  Patient has been laying in bed today.  Patient has not attended groups today even though the entire staff has encouraged her to go to groups and get out of bed to engage with peers/staff.  Patient has drank approximately 5 cups of fluids since lunch.  Ate approximately 75% of lunch and did go to dining room for dinner.  Respirations even and unlabored.  No signs/symptoms of pain/distress noted on patient's face/body movements.  Safety maintained with 15 minute checks.

## 2016-07-20 MED ORDER — LORAZEPAM 0.5 MG PO TABS: 0.5000 mg | ORAL_TABLET | Freq: Every day | ORAL | Status: DC | PRN

## 2016-07-20 NOTE — Progress Notes (Signed)
D: Patient denies SI/HI and A/V hallucinations; patient reports sleep is fair; reports appetite is fair; reports energy level is low ; reports ability to concentrate is poor; rates hopelessness 6/10; rates anxiety as 4/10;   A: Monitored q 15 minutes; patient encouraged to attend groups; patient educated about medications; patient given medications per physician orders; patient encouraged to express feelings and/or concerns  R: Patient is flat and blunted; patient forwards little information; patient attends meals and group after much encouraging; patient does not engage with peers and only staff when prompted;patient was able to set goal to talk with staff 1:1 when having feelings of SI; patient is taking medications as prescribed and tolerating medications;

## 2016-07-20 NOTE — Progress Notes (Signed)
Recreation Therapy Notes  Date: 07/20/16 Time: 1000 Location: 500 Hall Dayroom  Group Topic: Wellness  Goal Area(s) Addresses:  Patient will define components of whole wellness. Patient will verbalize benefit of whole wellness.  Intervention:  20 plastic cups, 2 small balls  Activity: Bowling.  Patients were divided into two teams.  LRT set up the cups for each team like bowling pins.  Each player was given to chances per turn to knock over as many cups as possible.  Each cup knocked down counted as one point.  The first team to reach 80 points won the game.  Education: Wellness, Dentist.   Education Outcome: Acknowledges education/In group clarification offered/Needs additional education.   Clinical Observations/Feedback: Pt did not attend group.   Victorino Sparrow, LRT/CTRS      Victorino Sparrow A 07/20/2016 12:24 PM

## 2016-07-20 NOTE — Progress Notes (Signed)
[facility] Group Notes:  (Nursing/MHT/Case Management/Adjunct)  Date:  07/20/2016  Time:  9:39 PM  Type of Therapy:  Psychoeducational Skills  Participation Level:  Minimal  Participation Quality:  Resistant  Affect:  Flat  Cognitive:  Lacking  Insight:  Lacking  Engagement in Group:  Resistant  Modes of Intervention:  Education  Summary of Progress/Problems: The patient would only share with the group that she had a good day and a good family visit , but refused to expound any further.   Archie Balboa S 07/20/2016, 9:39 PM

## 2016-07-20 NOTE — Progress Notes (Signed)
[facility] MD Progress Note  07/20/2016 1:56 PM Isabella [name]  MRN:  [id number]  Subjective:Patient states " I am OK."    Objective: Patient seen and chart reviewed.Discussed patient with treatment team.  Pt today seen as withdrawn, however is improving, more visible in milieu than previous days. Pt was able to participate in evaluation today . Pt denies new concerns , continues to rate her depression and anxiety at 6 . Continue to treat.     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  Past Medical History:  Past Medical History:  Diagnosis Date  . Bipolar disorder ([place])   . Hypertension   . Migraine 07/13/2016  . Schizophrenia ([place])    History reviewed. No pertinent surgical history. Family History:  Family History  Problem Relation Age of Onset  . Mental illness Neg Hx    Family Psychiatric  History: Please see H&P.  Social History: Please see H&P.  History  Alcohol Use No     History  Drug Use No    Social History   Social History  . Marital status: Single    Spouse name: N/A  . Number of children: N/A  . Years of education: N/A   Social History Main Topics  . Smoking status: Smoker, Current Status Unknown  . Smokeless tobacco: Never Used  . Alcohol use No  . Drug use: No  . Sexual activity: No   Other Topics Concern  . None   Social History Narrative  . None   Additional Social History:    Pain Medications: See PTA Prescriptions: See PTA Over the Counter: See PTA History of alcohol / drug use?: No history of alcohol / drug abuse  Sleep: Fair  Appetite:  Fair  Current Medications: Current Facility-Administered Medications  Medication Dose Route Frequency Provider Last Rate Last Dose  . acetaminophen (TYLENOL) tablet  650 mg  650 mg Oral Q6H PRN Ethelene Hal, NP   650 mg at 07/19/16 1132  . alum & mag hydroxide-simeth (MAALOX/MYLANTA) 200-200-20 MG/5ML suspension 30 mL  30 mL Oral Q4H PRN Ethelene Hal, NP      . aspirin EC tablet 81 mg  81 mg Oral Daily Laverle Hobby, PA-C   81 mg at 07/20/16 0847  . feeding supplement (ENSURE ENLIVE) (ENSURE ENLIVE) liquid 237 mL  237 mL Oral BID BM Paschal Blanton, MD   237 mL at 07/20/16 1000  . hydrALAZINE (APRESOLINE) tablet 10 mg  10 mg Oral Q6H PRN Laverle Hobby, PA-C      . hydrOXYzine (ATARAX/VISTARIL) tablet 25 mg  25 mg Oral Q6H PRN Ethelene Hal, NP   25 mg at 07/17/16 2204  . lidocaine (LIDODERM) 5 % 1 patch  1 patch Transdermal Daily Ursula Alert, MD   1 patch at 07/20/16 1000  . LORazepam (ATIVAN) tablet 0.5 mg  0.5 mg Oral QPC lunch Nolan Tuazon, MD      . losartan (COZAAR) tablet 25 mg  25 mg Oral Daily Laverle Hobby, PA-C   25 mg at 07/20/16 0846  . lurasidone (LATUDA) tablet 40 mg  40 mg Oral BID WC Ursula Alert, MD   40 mg at 07/20/16 0847  . magnesium hydroxide (MILK OF MAGNESIA) suspension 30 mL  30 mL Oral Daily PRN  Ethelene Hal, NP      . propranolol (INDERAL) tablet 10 mg  10 mg Oral BID Ursula Alert, MD   10 mg at 07/20/16 0847  . traZODone (DESYREL) tablet 150 mg  150 mg Oral QHS Encarnacion Slates, NP   150 mg at 07/19/16 2147  . venlafaxine (EFFEXOR) tablet 50 mg  50 mg Oral BID WC Ursula Alert, MD   50 mg at 07/20/16 0846   Lab Results: No results found for this or any previous visit (from the past 48 hour(s)).  Blood Alcohol level:  Lab Results  Component Value Date   ETH <5 23/76/2831   Metabolic Disorder Labs: Lab Results  Component Value Date   HGBA1C 5.4 01/31/2016   MPG 108 01/31/2016   Lab Results  Component Value Date   PROLACTIN 124.2 (H) 01/31/2016   Lab Results  Component Value Date   CHOL 152 01/31/2016   TRIG 147 01/31/2016   HDL 39 (L) 01/31/2016   CHOLHDL 3.9 01/31/2016    VLDL 29 01/31/2016   LDLCALC 84 01/31/2016   Physical Findings: AIMS: Facial and Oral Movements Muscles of Facial Expression: None, normal Lips and Perioral Area: None, normal Jaw: None, normal Tongue: None, normal,Extremity Movements Upper (arms, wrists, hands, fingers): None, normal Lower (legs, knees, ankles, toes): None, normal, Trunk Movements Neck, shoulders, hips: None, normal, Overall Severity Severity of abnormal movements (highest score from questions above): None, normal Incapacitation due to abnormal movements: None, normal Patient's awareness of abnormal movements (rate only patient's report): No Awareness, Dental Status Current problems with teeth and/or dentures?: No Does patient usually wear dentures?: No  CIWA:  CIWA-Ar Total: 1 COWS:  COWS Total Score: 2  Musculoskeletal: Strength & Muscle Tone: within normal limits Gait & Station: normal Patient leans: N/A  Psychiatric Specialty Exam: Physical Exam  Nursing note and vitals reviewed.   Review of Systems  Psychiatric/Behavioral: Positive for depression. The patient is nervous/anxious.   All other systems reviewed and are negative.   Blood pressure (!) 118/101, pulse 86, temperature 98.8 F (37.1 C), resp. rate 16.There is no height or weight on file to calculate BMI.  General Appearance: Fairly Groomed  Eye Contact:  Fair  Speech:  Normal Rate  Volume:  Decreased  Mood:  Anxious and Depressed  Affect:  Congruent  Thought Process: goal directed   Orientation:  Other:  self, situation  Thought Content:  Paranoid Ideation, is guarded  Suicidal Thoughts:  No  Homicidal Thoughts:  No  Memory:  Immediate;   Fair Recent;   Poor Remote;   Poor  Judgement:  Fair  Insight:  Shallow  Psychomotor Activity:  Decreased  Concentration:  Concentration: Fair and Attention Span: Fair  Recall:  [employer] of Knowledge:  Poor  Language:  Fair  Akathisia:  No  Handed:  Right  AIMS (if indicated):     Assets:   Physical Health Social Support  ADL's:  Intact  Cognition: wnl  Sleep:  Number of Hours: 6.5   Schizophrenia, catatonic type ([place]) unstable - improving  Will continue today 07/20/16 plan as below except where it is noted.    Treatment Plan Summary:Pt today with depression, anxiety although improving , is more visible in milieu , will continue to treat.    Daily contact with patient to assess and evaluate symptoms and progress in treatment, Medication management and Plan see below    For schizophrenia: reviewed   Will continue Latuda 40 mg po bid with  meals.  For negative sx: Will continue to reduce ativan , make it prn. Will continue Effexor 50 mg po bid.    For insomnia: reviewed Trazodone to 150 mg po qhs.  Labs reviewed- lipid panel - wnl , Pl - elevated - will monitor closely.  EKG reviewed - nonspecific T wave abnormality, tachycardia.  Patient encouraged to attend groups , participate in milieu.  CSW will continue to work on disposition.   Dayshia Ballinas, MD 07/20/2016, 1:56 PMPatient ID: Isabella [name], female   DOB: 06-Jun-1975, 41 y.o.   MRN: [id number]

## 2016-07-20 NOTE — BHH Group Notes (Signed)
[facility] LCSW Group Therapy  07/20/2016 4:19 PM   Type of Therapy:  Group Therapy  Participation Level:  Active  Participation Quality:  Attentive  Affect:  Appropriate  Cognitive:  Appropriate  Insight:  Improving  Engagement in Therapy:  Engaged  Modes of Intervention:  Clarification, Education, Exploration and Socialization  Summary of Progress/Problems: Today's group focused on resilience.  Found her in bed, awake, c/o not feeling well, so not coming to group.  Appeared 10 minutes later, and stayed the entire time.  Had nothing to contribute about resilience, but when asked about her decision to come to group, stated "I changed my mind." Trish Mage 07/20/2016 , 4:19 PM

## 2016-07-20 NOTE — Progress Notes (Signed)
D: Patient's interpreter was present during 1:1 assessment. Seen patient in room lying down stated Report depression 7/10. Denies pain, SI/HI, AH/VH at this time. Accepted her bed time med. No behavioral issue noted.  A: Staff encouraged patient to attend and participate in group. Offered due med as prescribed. Routine safety checks maintained. Will continue to monitor patient.  R: Patient remains safe on unit.

## 2016-07-20 NOTE — Progress Notes (Signed)
  D: Pt was more spontaneous today than previous day while with the Probation officer. Initially pt stated that she had not spoken to the doctor. However, with the interpretors help pt did admit to speaking with the dr. Konrad Dolores informed pt that she was informed of her going to group and the cafeteria. Encouraged pt to continue to participate in the milieu. Pt has no questions or concerns.    A:  Support and encouragement was offered. 15 min checks continued for safety.  R: Pt remains safe.

## 2016-07-21 NOTE — Progress Notes (Signed)
Recreation Therapy Notes  Date: 07/21/16 Time: 0945 Location: [address] Dayroom   Group Topic: Communication, Team Building, Problem Solving  Goal Area(s) Addresses:  Patient will effectively work with peer towards shared goal.  Patient will identify skills used to make activity successful.  Patient will identify how skills used during activity can be used to reach post d/c goals.   Behavioral Response: Minimal  Intervention: STEM Activity  Activity: Landing Pad. In teams patients were given 12 plastic drinking straws and a length of masking tape. Using the materials provided patients were asked to build a landing pad to catch a golf ball dropped from approximately 6 feet in the air.   Education: Education officer, community, Discharge Planning   Education Outcome: Acknowledges education/In group clarification offered/Needs additional education.   Clinical Observations/Feedback: Pt gave some assistance in building the landing pad.   Pt was flat and quiet.  Pt stated the skills from the group would help her "build relationships."   Victorino Sparrow, LRT/CTRS         Victorino Sparrow A 07/21/2016 11:43 AM

## 2016-07-21 NOTE — Progress Notes (Signed)
Pt in room with family and translator.  Pt sts she wants to attend group.  Pt does participate in group.  Pt sts he hand feels much better and cold pack discarded.  Pt sts she does not want a sleeping medication.  Pt makes better eye contact and answers questions with short answers.  Pt did get out of bed to help her roommate walk to the bathroom. Pt returns to bed after group and snack. Pt offered support and encouragement. Pt remains safe on unit.

## 2016-07-21 NOTE — B[facility] Group Notes (Signed)
[facility] Mental [facility] Association Group Therapy  07/21/2016 , 1:28 PM    Type of Therapy:  Mental Health Association Presentation  Participation Level:  Active  Participation Quality:  Attentive  Affect:  Blunted  Cognitive:  Oriented  Insight:  Limited  Engagement in Therapy:  Engaged  Modes of Intervention:  Discussion, Education and Socialization  Summary of Progress/Problems:  Isabella [name] from [place] came to present his recovery story and play the guitar.  Stayed the entire time, engaged throughout.  Isabella [name] B 07/21/2016 , 1:28 PM

## 2016-07-21 NOTE — Progress Notes (Signed)
[facility] MD Progress Note  07/21/2016 3:37 PM Verdene Creson  MRN:  [id number]  Subjective:  Patient in room, quiet, no c/o or physical distress evident.  Objective: Patient seen and chart reviewed.Discussed patient with treatment team.  Patient is seen in her room.  Known to this NP.  Presentation as with previous admission, quiet, not very talkative.   Although somewhat withdrawn, pleasant, language barrier also as patient is mostly spanish speaking.  Compliant with medications.  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  Past Medical History:  Past Medical History:  Diagnosis Date  . Bipolar disorder ([place])   . Hypertension   . Migraine 07/13/2016  . Schizophrenia ([place])    History reviewed. No pertinent surgical history. Family History:  Family History  Problem Relation Age of Onset  . Mental illness Neg Hx    Family Psychiatric  History: Please see H&P.  Social History: Please see H&P.  History  Alcohol Use No     History  Drug Use No    Social History   Social History  . Marital status: Single    Spouse name: N/A  . Number of children: N/A  . Years of education: N/A   Social History Main Topics  . Smoking status: Smoker, Current Status Unknown  . Smokeless tobacco: Never Used  . Alcohol use No  . Drug use: No  . Sexual activity: No   Other Topics Concern  . None   Social History Narrative  . None   Additional Social History:    Pain Medications: See PTA Prescriptions: See PTA Over the Counter: See PTA History of alcohol / drug use?: No history of alcohol / drug abuse  Sleep: Fair  Appetite:  Fair  Current Medications: Current Facility-Administered Medications  Medication Dose Route Frequency Provider Last Rate Last Dose   . acetaminophen (TYLENOL) tablet 650 mg  650 mg Oral Q6H PRN Ethelene Hal, NP   650 mg at 07/19/16 1132  . alum & mag hydroxide-simeth (MAALOX/MYLANTA) 200-200-20 MG/5ML suspension 30 mL  30 mL Oral Q4H PRN Ethelene Hal, NP      . aspirin EC tablet 81 mg  81 mg Oral Daily Laverle Hobby, PA-C   81 mg at 07/21/16 0820  . feeding supplement (ENSURE ENLIVE) (ENSURE ENLIVE) liquid 237 mL  237 mL Oral BID BM Saramma Eappen, MD   237 mL at 07/21/16 1121  . hydrALAZINE (APRESOLINE) tablet 10 mg  10 mg Oral Q6H PRN Laverle Hobby, PA-C      . hydrOXYzine (ATARAX/VISTARIL) tablet 25 mg  25 mg Oral Q6H PRN Ethelene Hal, NP   25 mg at 07/17/16 2204  . lidocaine (LIDODERM) 5 % 1 patch  1 patch Transdermal Daily Ursula Alert, MD   1 patch at 07/21/16 9485  . LORazepam (ATIVAN) tablet 0.5 mg  0.5 mg Oral Daily PRN Ursula Alert, MD      . losartan (COZAAR) tablet 25 mg  25 mg Oral Daily Laverle Hobby, PA-C   25 mg at 07/21/16 4627  . lurasidone (LATUDA) tablet 40 mg  40 mg Oral BID WC Ursula Alert, MD   40 mg at 07/21/16 0820  . magnesium hydroxide (MILK OF MAGNESIA) suspension 30 mL  30 mL Oral Daily PRN Margarita Grizzle  Nicky Pugh, NP      . propranolol (INDERAL) tablet 10 mg  10 mg Oral BID Ursula Alert, MD   10 mg at 07/21/16 0820  . traZODone (DESYREL) tablet 150 mg  150 mg Oral QHS Encarnacion Slates, NP   150 mg at 07/20/16 2229  . venlafaxine (EFFEXOR) tablet 50 mg  50 mg Oral BID WC Ursula Alert, MD   50 mg at 07/21/16 6195   Lab Results: No results found for this or any previous visit (from the past 48 hour(s)).  Blood Alcohol level:  Lab Results  Component Value Date   ETH <5 09/32/6712   Metabolic Disorder Labs: Lab Results  Component Value Date   HGBA1C 5.4 01/31/2016   MPG 108 01/31/2016   Lab Results  Component Value Date   PROLACTIN 124.2 (H) 01/31/2016   Lab Results  Component Value Date   CHOL 152 01/31/2016   TRIG 147 01/31/2016   HDL 39 (L)  01/31/2016   CHOLHDL 3.9 01/31/2016   VLDL 29 01/31/2016   LDLCALC 84 01/31/2016   Physical Findings: AIMS: Facial and Oral Movements Muscles of Facial Expression: None, normal Lips and Perioral Area: None, normal Jaw: None, normal Tongue: None, normal,Extremity Movements Upper (arms, wrists, hands, fingers): None, normal Lower (legs, knees, ankles, toes): None, normal, Trunk Movements Neck, shoulders, hips: None, normal, Overall Severity Severity of abnormal movements (highest score from questions above): None, normal Incapacitation due to abnormal movements: None, normal Patient's awareness of abnormal movements (rate only patient's report): No Awareness, Dental Status Current problems with teeth and/or dentures?: No Does patient usually wear dentures?: No  CIWA:  CIWA-Ar Total: 1 COWS:  COWS Total Score: 2  Musculoskeletal: Strength & Muscle Tone: within normal limits Gait & Station: normal Patient leans: N/A  Psychiatric Specialty Exam: Physical Exam  Nursing note and vitals reviewed.   Review of Systems  Psychiatric/Behavioral: Positive for depression. The patient is nervous/anxious.   All other systems reviewed and are negative.   Blood pressure 106/84, pulse 89, temperature 98.1 F (36.7 C), temperature source Oral, resp. rate 20.There is no height or weight on file to calculate BMI.  General Appearance: Fairly Groomed  Eye Contact:  Fair  Speech:  Normal Rate  Volume:  Decreased  Mood:  Anxious and Depressed  Affect:  Congruent  Thought Process: goal directed   Orientation:  Other:  self, situation  Thought Content:  Paranoid Ideation, is guarded  Suicidal Thoughts:  No  Homicidal Thoughts:  No  Memory:  Immediate;   Fair Recent;   Poor Remote;   Poor  Judgement:  Fair  Insight:  Shallow  Psychomotor Activity:  Decreased  Concentration:  Concentration: Fair and Attention Span: Fair  Recall:  [employer] of Knowledge:  Poor  Language:  Fair  Akathisia:   No  Handed:  Right  AIMS (if indicated):     Assets:  Physical Health Social Support  ADL's:  Intact  Cognition: wnl  Sleep:  Number of Hours: 6.5   Schizophrenia, 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    Treatment Plan Summary:Pt today with depression, anxiety although improving , is more visible in milieu , will continue to treat.    Daily contact with patient to assess and evaluate symptoms and progress in treatment, Medication management and Plan see below    For schizophrenia: reviewed   Will continue Latuda 40 mg po bid  with meals.  For negative sx: Will continue to reduce ativan , make it prn. Will continue Effexor 50 mg po bid.  For insomnia: reviewed Trazodone to 150 mg po qhs.  Labs reviewed- lipid panel - wnl , Pl - elevated - will monitor closely.  EKG reviewed - nonspecific T wave abnormality, tachycardia.  Patient encouraged to attend groups , participate in milieu.  CSW will continue to work on disposition.  Janett Labella, NP [facility] 07/21/2016, 3:37 PM

## 2016-07-21 NOTE — Progress Notes (Signed)
Adult Psychoeducational Group Note  Date:  07/21/2016 Time:  9:26 PM  Group Topic/Focus:  Wrap-Up Group:   The focus of this group is to help patients review their daily goal of treatment and discuss progress on daily workbooks.  Participation Level:  Minimal  Participation Quality:  Appropriate  Affect:  Appropriate  Cognitive:  Alert and Appropriate  Insight: Lacking  Engagement in Group:  Poor  Modes of Intervention:  Discussion  Additional Comments:  Patient just stated she had a good day and did not want to discuss her day.  Jolene Provost 07/21/2016, 9:26 PM

## 2016-07-21 NOTE — Progress Notes (Signed)
DAR NOTE: Patient presents with depressed affect and mood.  Denies pain, auditory and visual hallucinations.  Rates depression at 4, hopelessness at 4, and anxiety at 3.  Maintained on routine safety checks.  Medications given as prescribed.  Support and encouragement offered as needed.  Attended group and participated.  Patient remained isolative to her room.  No interaction with staff or peers.  Offered no complaint.

## 2016-07-22 MED ORDER — VENLAFAXINE HCL ER 150 MG PO CP24
150.0000 mg | ORAL_CAPSULE | Freq: Every day | ORAL | Status: DC
  Administered 2016-07-23 – 2016-07-28 (×6): 150 mg via ORAL
  Filled 2016-07-22 (×8): qty 1

## 2016-07-22 MED ORDER — VENLAFAXINE HCL 50 MG PO TABS
50.0000 mg | ORAL_TABLET | Freq: Two times a day (BID) | ORAL | Status: AC
  Administered 2016-07-22: 50 mg via ORAL
  Filled 2016-07-22: qty 1

## 2016-07-22 MED ORDER — TRAZODONE HCL 100 MG PO TABS
200.0000 mg | ORAL_TABLET | Freq: Every day | ORAL | Status: DC
Start: 2016-07-22 — End: 2016-07-23
  Administered 2016-07-22: 200 mg via ORAL
  Filled 2016-07-22 (×4): qty 2

## 2016-07-22 NOTE — Progress Notes (Signed)
[facility] MD Progress Note  07/22/2016 3:31 PM Shiesha Jahn  MRN:  [id number]  Subjective:Pt states " I did not sleep at all, I do not feel too good."     Objective: Patient seen and chart reviewed.Discussed patient with treatment team.  Pt today seen as withdrawn , depressed, states she continues to feel bad. Pt reports having sleep issues. Discussed readjusting her sleep medication as well as effexor. Per RN - pt continues to need encouragement to get out of bed and participate.      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  Past Medical History:  Past Medical History:  Diagnosis Date  . Bipolar disorder ([place])   . Hypertension   . Migraine 07/13/2016  . Schizophrenia ([place])    History reviewed. No pertinent surgical history. Family History:  Family History  Problem Relation Age of Onset  . Mental illness Neg Hx    Family Psychiatric  History: Please see H&P.  Social History: Please see H&P.  History  Alcohol Use No     History  Drug Use No    Social History   Social History  . Marital status: Single    Spouse name: N/A  . Number of children: N/A  . Years of education: N/A   Social History Main Topics  . Smoking status: Smoker, Current Status Unknown  . Smokeless tobacco: Never Used  . Alcohol use No  . Drug use: No  . Sexual activity: No   Other Topics Concern  . None   Social History Narrative  . None   Additional Social History:    Pain Medications: See PTA Prescriptions: See PTA Over the Counter: See PTA History of alcohol / drug use?: No history of alcohol / drug abuse  Sleep: Poor  Appetite:  Fair  Current Medications: Current Facility-Administered Medications  Medication Dose Route Frequency Provider Last Rate  Last Dose  . acetaminophen (TYLENOL) tablet 650 mg  650 mg Oral Q6H PRN Ethelene Hal, NP   650 mg at 07/19/16 1132  . alum & mag hydroxide-simeth (MAALOX/MYLANTA) 200-200-20 MG/5ML suspension 30 mL  30 mL Oral Q4H PRN Ethelene Hal, NP      . aspirin EC tablet 81 mg  81 mg Oral Daily Laverle Hobby, PA-C   81 mg at 07/22/16 0744  . feeding supplement (ENSURE ENLIVE) (ENSURE ENLIVE) liquid 237 mL  237 mL Oral BID BM Jalisha Enneking, MD   237 mL at 07/21/16 1500  . hydrALAZINE (APRESOLINE) tablet 10 mg  10 mg Oral Q6H PRN Laverle Hobby, PA-C      . hydrOXYzine (ATARAX/VISTARIL) tablet 25 mg  25 mg Oral Q6H PRN Ethelene Hal, NP   25 mg at 07/17/16 2204  . lidocaine (LIDODERM) 5 % 1 patch  1 patch Transdermal Daily Ursula Alert, MD   1 patch at 07/22/16 0744  . LORazepam (ATIVAN) tablet 0.5 mg  0.5 mg Oral Daily PRN Ursula Alert, MD      . losartan (COZAAR) tablet 25 mg  25 mg Oral Daily Laverle Hobby, PA-C   25 mg at 07/22/16 0745  . lurasidone (LATUDA) tablet 40 mg  40 mg Oral BID WC Ursula Alert, MD   40 mg at 07/22/16 0744  . magnesium hydroxide (MILK  OF MAGNESIA) suspension 30 mL  30 mL Oral Daily PRN Ethelene Hal, NP      . propranolol (INDERAL) tablet 10 mg  10 mg Oral BID Ursula Alert, MD   10 mg at 07/22/16 0745  . traZODone (DESYREL) tablet 200 mg  200 mg Oral QHS Terrah Decoster, MD      . venlafaxine (EFFEXOR) tablet 50 mg  50 mg Oral BID WC Ursula Alert, MD      . Derrill Memo ON 07/23/2016] venlafaxine XR (EFFEXOR-XR) 24 hr capsule 150 mg  150 mg Oral Q breakfast Laberta Wilbon, MD       Lab Results: No results found for this or any previous visit (from the past 48 hour(s)).  Blood Alcohol level:  Lab Results  Component Value Date   ETH <5 48/05/6551   Metabolic Disorder Labs: Lab Results  Component Value Date   HGBA1C 5.4 01/31/2016   MPG 108 01/31/2016   Lab Results  Component Value Date   PROLACTIN 124.2 (H) 01/31/2016   Lab Results   Component Value Date   CHOL 152 01/31/2016   TRIG 147 01/31/2016   HDL 39 (L) 01/31/2016   CHOLHDL 3.9 01/31/2016   VLDL 29 01/31/2016   LDLCALC 84 01/31/2016   Physical Findings: AIMS: Facial and Oral Movements Muscles of Facial Expression: None, normal Lips and Perioral Area: None, normal Jaw: None, normal Tongue: None, normal,Extremity Movements Upper (arms, wrists, hands, fingers): None, normal Lower (legs, knees, ankles, toes): None, normal, Trunk Movements Neck, shoulders, hips: None, normal, Overall Severity Severity of abnormal movements (highest score from questions above): None, normal Incapacitation due to abnormal movements: None, normal Patient's awareness of abnormal movements (rate only patient's report): No Awareness, Dental Status Current problems with teeth and/or dentures?: No Does patient usually wear dentures?: No  CIWA:  CIWA-Ar Total: 1 COWS:  COWS Total Score: 1  Musculoskeletal: Strength & Muscle Tone: within normal limits Gait & Station: normal Patient leans: N/A  Psychiatric Specialty Exam: Physical Exam  Nursing note and vitals reviewed.   Review of Systems  Psychiatric/Behavioral: Positive for depression. The patient is nervous/anxious and has insomnia.   All other systems reviewed and are negative.   Blood pressure 113/71, pulse 81, temperature 97.7 F (36.5 C), temperature source Oral, resp. rate 16.There is no height or weight on file to calculate BMI.  General Appearance: Fairly Groomed  Eye Contact:  Fair  Speech:  Normal Rate  Volume:  Decreased  Mood:  Anxious, Depressed and Dysphoric  Affect:  Congruent  Thought Process: goal directed  Orientation:  Other:  self, place, time  Thought Content:  Paranoid Ideation and Rumination guarded  Suicidal Thoughts:  No  Homicidal Thoughts:  No  Memory:  Immediate;   Fair Recent;   Poor Remote;   Poor  Judgement:  Fair  Insight:  Shallow  Psychomotor Activity:  Decreased   Concentration:  Concentration: Fair and Attention Span: Fair  Recall:  [employer] of Knowledge:  Poor  Language:  Fair  Akathisia:  No  Handed:  Right  AIMS (if indicated):     Assets:  Physical Health Social Support  ADL's:  Intact  Cognition: wnl  Sleep:  Number of Hours: 6.75   Schizophrenia, 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     Treatment Plan Summary:Pt today seen as depressed, withdrawn , continues to report sleep issues, will continue to readjust medications.  Daily contact with patient to assess and evaluate symptoms and progress in treatment, Medication management and Plan see below    For schizophrenia: reviewed Will continue Latuda 40 mg po bid with meals.  For negative sx: Will continue to reduce ativan , make it prn. Will change Effexor to XL and increase to 150 mg po daily.   For insomnia: Increase Trazodone to 200 mg po qhs.   Labs reviewed- lipid panel - wnl , Pl - elevated - will monitor closely.  EKG reviewed - nonspecific T wave abnormality, tachycardia.  Patient encouraged to attend groups , participate in milieu.  CSW will continue to work on disposition.   Andrya Roppolo, MD 07/22/2016, 3:31 PMPatient ID: [name], female   DOB: 11/23/1975, 41 y.o.   MRN: [id number]

## 2016-07-22 NOTE — Progress Notes (Signed)
D: Pt denies SI/HI/AVH. Pt is pleasant and cooperative. Pt stated she was a little anxious earlier, but did not want anything for it. Pt did tak a shower this evening. Pt seen earlier talking with her family members and seemed to have a brighter affect.   A: Pt was offered support and encouragement. Pt was given scheduled medications. Pt was encourage to attend groups. Q 15 minute checks were done for safety.   R: safety maintained on unit.

## 2016-07-22 NOTE — Progress Notes (Signed)
Recreation Therapy Notes  Date: 07/22/16 Time: 1000 Location: 500 [address] Dayroom  Group Topic: Self-Expression  Goal Area(s) Addresses:  Patient will identify positive benefits of expressing self. Patient will verbalize benefit of increased self-expression post d/c.  Behavioral Response: Engaged  Intervention: Picture frame worksheets, magazines, scissors, construction paper, markers, glue sticks  Activity: Picture This.  Patients were to envision the place they would most want to travel to if they had the means to do so or something meaning full they want to accomplish.  They were to then take those ideas and try to find pictures from the magazines or draw what they visualized that place to be inside the picture frame.    Education:  Self-Expression, Dentist.   Education Outcome: Acknowledges education/In group clarification offered/Needs additional education  Clinical Observations/Feedback: Pt expressed she would like to go to the mall because she likes to shop.  Pt spoke more on her own and relied less on the interpretor.  Pt was still a little flat but more engaged.   Victorino Sparrow, LRT/CTRS     Ria Comment, [place] Code A 07/22/2016 1:02 PM

## 2016-07-22 NOTE — Tx Team (Signed)
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 acetaminophen (TYLENOL) tablet 650 mg  650 mg Oral Q6H PRN Ethelene Hal, NP   650 mg at 07/19/16 1132  . alum & mag hydroxide-simeth (MAALOX/MYLANTA) 200-200-20 MG/5ML suspension 30 mL  30 mL Oral Q4H PRN Ethelene Hal, NP      . aspirin EC tablet 81 mg  81 mg Oral Daily Laverle Hobby, PA-C   81 mg at 07/22/16 0744  . feeding supplement (ENSURE ENLIVE) (ENSURE ENLIVE) liquid 237 mL  237 mL Oral BID BM Saramma Eappen, MD   237 mL at 07/21/16 1500  . hydrALAZINE (APRESOLINE) tablet 10 mg  10 mg Oral Q6H PRN Laverle Hobby, PA-C      . hydrOXYzine (ATARAX/VISTARIL) tablet 25 mg  25 mg Oral Q6H PRN Ethelene Hal, NP   25 mg at 07/17/16 2204  . lidocaine (LIDODERM) 5 % 1 patch  1 patch Transdermal Daily Ursula Alert, MD   1 patch at 07/22/16 0744  . LORazepam (ATIVAN) tablet 0.5 mg  0.5 mg Oral Daily PRN Ursula Alert, MD      . losartan (COZAAR) tablet 25 mg  25 mg Oral Daily Laverle Hobby, PA-C   25 mg at 07/22/16 0745  . lurasidone (LATUDA) tablet 40 mg  40 mg Oral BID WC Ursula Alert, MD   40 mg at 07/22/16 0744  . magnesium hydroxide (MILK OF MAGNESIA) suspension 30 mL  30 mL Oral Daily PRN Ethelene Hal, NP      . propranolol (INDERAL) tablet 10 mg  10 mg Oral BID Ursula Alert, MD   10 mg at 07/22/16 0745  . traZODone (DESYREL) tablet 150 mg  150 mg Oral QHS Encarnacion Slates, NP   150 mg at 07/20/16 2229  . venlafaxine (EFFEXOR) tablet 50 mg  50 mg Oral BID WC Ursula Alert, MD   50 mg at 07/22/16 2633    PTA Medications: Prescriptions Prior to Admission   Medication Sig Dispense Refill Last Dose  . lurasidone (LATUDA) 40 MG TABS tablet Take 40 mg by mouth every evening.   Past Month at Unknown time  . meclizine (ANTIVERT) 25 MG tablet Take 25 mg by mouth 3 (three) times daily as needed for dizziness.   Past Month at Unknown time    Treatment Modalities: Medication Management, Group therapy, Case management,  1 to 1 session with clinician, Psychoeducation, Recreational therapy.   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  Therapeutic Interventions: 1 to 1 sessions, Unit Group sessions and Medication administration.  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  Therapeutic Interventions: 1 to 1 sessions, Unit Group sessions and Medication administration.  Evaluation of Outcomes: Adequate for Discharge   3/9:  For schizophrenia:   Will continue Latuda 40 mg po bid with meals.  For negative sx: Will continue to reduce ativan , make it prn. Will continue Effexor 50 mg po bid.  For insomnia: reviewed Trazodone to 150 mg po  qhs.   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 RN will report any adverse and/or side effects to prescribing provider.  Therapeutic Interventions: 1 on 1 counseling sessions, Psychoeducation, Medication administration, Evaluate responses to treatment, Monitor vital signs and CBGs as ordered, Perform/monitor CIWA, COWS, AIMS and Fall Risk screenings as ordered, Perform wound care treatments as ordered.  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  Short Term Goals: Patient will demonstrate increased ability to follow instructions, as demonstrated by ability to follow LRT instructions on first prompt during recreation therapy group sessions.  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  Short Term Goals: Engage patient in aftercare planning with referrals and resources  Therapeutic Interventions: Assess for all discharge needs, 1 to 1 time with Social worker, Explore available resources and support  systems, Assess for adequacy in community support network, Educate family and significant other(s) on suicide prevention, Complete Psychosocial Assessment, Interpersonal group therapy.  Evaluation of Outcomes: Met  Return home, follow up [place] in Treatment: Attending groups: Yes Participating in groups: Yes Taking medication as prescribed: Yes Toleration medication: Yes, no side effects reported at this time Family/Significant other contact made: Yes Patient understands diagnosis:No  Limited insight Discussing patient identified problems/goals with staff: Yes Medical problems stabilized or resolved: Yes Denies suicidal/homicidal ideation: Yes Issues/concerns per patient self-inventory: None Other: N/A  New problem(s) identified: None identified at this time.   New Short Term/Long Term Goal(s): None identified at this time.   Discharge Plan or Barriers:   Reason for Continuation of Hospitalization: Depression Medication stabilization   Estimated Length of Stay: [place] d/c tomorrow, Sunday at the latest, Not releasing today due to poor sleep last night  Attendees: Patient: 07/22/2016  9:16 AM  Physician: Ursula Alert, MD 07/22/2016  9:16 AM  Nursing: Hoy Register, RN 07/22/2016  9:16 AM  RN Care Manager: Lars Pinks, RN 07/22/2016  9:16 AM  Social Worker: Ripley Fraise 07/22/2016  9:16 AM  Recreational Therapist: Laretta Bolster  07/22/2016  9:16 AM  Other: Norberto Sorenson 07/22/2016  9:16 AM  Other:  07/22/2016  9:16 AM    Scribe for Treatment Team:  Roque Lias LCSW 07/22/2016  9:16 AM

## 2016-07-22 NOTE — Plan of Care (Signed)
Problem: Safety: Goal: Periods of time without injury will increase Outcome: Progressing Pt safe on the unit at this time   

## 2016-07-22 NOTE — [facility] Group Notes (Signed)
[place] LCSW Group Therapy  07/22/2016  1:05 PM  Type of Therapy:  Group therapy  Participation Level:  Active  Participation Quality:  Attentive  Affect:  Flat  Cognitive:  Oriented  Insight:  Limited  Engagement in Therapy:  Limited  Modes of Intervention:  Discussion, Socialization  Summary of Progress/Problems:  Chaplain was here to lead a group on themes of hope and courage.  Invited.  Chose to not attend.  Roque Lias B 07/22/2016 1:06 PM

## 2016-07-22 NOTE — Progress Notes (Signed)
Nursing Note 07/22/2016 [id number]  Data Reports sleeping poorly without PRN sleep med.  Rates depression 4/10, hopelessness 8/10, and anxiety 3/10. Affect flat mood depressed.  Denies HI, SI, AVH.  Patient attended one group today, stayed in room most of day despite encouragement to get up.  States "I don't feel well," nurse attempted to engage but patient shares very little.  Action Spoke with patient 1:1, nurse offered support to patient throughout shift.  Continues to be monitored on 15 minute checks for safety.  Response Remains safe on unit, though isolative throughout day.

## 2016-07-22 NOTE — Plan of Care (Signed)
Problem: Medication: Goal: Compliance with prescribed medication regimen will improve Outcome: Progressing Patient takes medications as prescribed; however, has limited knowledge of prescribed regimen.  Re-education provided today.  Problem: Self-Concept: Goal: Ability to disclose and discuss suicidal ideas will improve Outcome: Completed/Met Date Met: 07/22/16 Denies SI, reports she is able to approach staff before acting on harmful thoughts.

## 2016-07-23 MED ORDER — DOXEPIN HCL 10 MG PO CAPS
10.0000 mg | ORAL_CAPSULE | Freq: Every day | ORAL | Status: DC
  Administered 2016-07-24 – 2016-07-25 (×2): 10 mg via ORAL
  Filled 2016-07-23 (×5): qty 1

## 2016-07-23 MED ORDER — TRAZODONE HCL 50 MG PO TABS
50.0000 mg | ORAL_TABLET | Freq: Every evening | ORAL | Status: DC | PRN
  Administered 2016-07-24 – 2016-07-25 (×2): 50 mg via ORAL
  Filled 2016-07-23 (×2): qty 1

## 2016-07-23 NOTE — Progress Notes (Signed)
DAR NOTE: Patient presents with depressed affect and mood.  Denies pain, auditory and visual hallucinations.  Rates depression at 3, hopelessness at 2, and anxiety at 4.  Maintained on routine safety checks.  Medications given as prescribed.  Support and encouragement offered as needed.  Attended group and participated.   Patient remained withdrawn and isolative to her room.  Patient encouraged several times to be visible in milieu.  Offered no complaint.

## 2016-07-23 NOTE — Plan of Care (Signed)
Problem: Safety: Goal: Periods of time without injury will increase Outcome: Progressing Pt safe on the unit at this time   

## 2016-07-23 NOTE — Progress Notes (Signed)
D: Pt denies SI/HI/AVH. Pt is pleasant and cooperative. Pt stated she felt ok today. Pt mother thought pt was anxious , said she did not think pt was ready for discharge due to her being so anxious, fidgety.   A: Pt was offered support and encouragement. Pt was given scheduled medications. Pt was encourage to attend groups. Q 15 minute checks were done for safety.   R:Pt attends groups and interacts well with peers and staff. Pt is taking medication. Pt has no complaints at this time.Pt receptive to treatment and safety maintained on unit.

## 2016-07-23 NOTE — Progress Notes (Signed)
[facility] MD Progress Note  07/23/2016 12:56 PM Isabella [name]  MRN:  [id number]  Subjective: Pt states " I did not sleep."     Objective: Patient seen and chart reviewed.Discussed patient with treatment team.  Pt seen as depressed, stays to self all day, continues to need a lot of encouragement to participate in milieu. Pt also has restless sleep, will change her trazodone to doxepin. Pt agrees with the same. Continue to encourage and support.        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  Past Medical History:  Past Medical History:  Diagnosis Date  . Bipolar disorder ([place])   . Hypertension   . Migraine 07/13/2016  . Schizophrenia ([place])    History reviewed. No pertinent surgical history. Family History:  Family History  Problem Relation Age of Onset  . Mental illness Neg Hx    Family Psychiatric  History: Please see H&P.  Social History: Please see H&P.  History  Alcohol Use No     History  Drug Use No    Social History   Social History  . Marital status: Single    Spouse name: N/A  . Number of children: N/A  . Years of education: N/A   Social History Main Topics  . Smoking status: Smoker, Current Status Unknown  . Smokeless tobacco: Never Used  . Alcohol use No  . Drug use: No  . Sexual activity: No   Other Topics Concern  . None   Social History Narrative  . None   Additional Social History:    Pain Medications: See PTA Prescriptions: See PTA Over the Counter: See PTA History of alcohol / drug use?: No history of alcohol / drug abuse  Sleep: Poor  Appetite:  Fair  Current Medications: Current Facility-Administered Medications  Medication Dose Route Frequency Provider Last Rate Last Dose  . acetaminophen  (TYLENOL) tablet 650 mg  650 mg Oral Q6H PRN Ethelene Hal, NP   650 mg at 07/19/16 1132  . alum & mag hydroxide-simeth (MAALOX/MYLANTA) 200-200-20 MG/5ML suspension 30 mL  30 mL Oral Q4H PRN Ethelene Hal, NP      . aspirin EC tablet 81 mg  81 mg Oral Daily Laverle Hobby, PA-C   81 mg at 07/23/16 0825  . feeding supplement (ENSURE ENLIVE) (ENSURE ENLIVE) liquid 237 mL  237 mL Oral BID BM Cesar Rogerson, MD   237 mL at 07/23/16 1029  . hydrALAZINE (APRESOLINE) tablet 10 mg  10 mg Oral Q6H PRN Laverle Hobby, PA-C      . hydrOXYzine (ATARAX/VISTARIL) tablet 25 mg  25 mg Oral Q6H PRN Ethelene Hal, NP   25 mg at 07/17/16 2204  . lidocaine (LIDODERM) 5 % 1 patch  1 patch Transdermal Daily Ursula Alert, MD   1 patch at 07/23/16 0824  . LORazepam (ATIVAN) tablet 0.5 mg  0.5 mg Oral Daily PRN Ursula Alert, MD      . losartan (COZAAR) tablet 25 mg  25 mg Oral Daily Laverle Hobby, PA-C   25 mg at 07/23/16 0825  . lurasidone (LATUDA) tablet 40 mg  40 mg Oral BID WC Ursula Alert, MD   40 mg at 07/23/16 0826  . magnesium hydroxide (MILK OF MAGNESIA) suspension 30 mL  30 mL Oral Daily PRN Ethelene Hal, NP      . propranolol (INDERAL) tablet 10 mg  10 mg Oral BID Ursula Alert, MD   10 mg at 07/23/16 0825  . traZODone (DESYREL) tablet 200 mg  200 mg Oral QHS Ursula Alert, MD   200 mg at 07/22/16 2133  . venlafaxine XR (EFFEXOR-XR) 24 hr capsule 150 mg  150 mg Oral Q breakfast Ursula Alert, MD   150 mg at 07/23/16 8315   Lab Results: No results found for this or any previous visit (from the past 48 hour(s)).  Blood Alcohol level:  Lab Results  Component Value Date   ETH <5 17/61/6073   Metabolic Disorder Labs: Lab Results  Component Value Date   HGBA1C 5.4 01/31/2016   MPG 108 01/31/2016   Lab Results  Component Value Date   PROLACTIN 124.2 (H) 01/31/2016   Lab Results  Component Value Date   CHOL 152 01/31/2016   TRIG 147 01/31/2016   HDL 39 (L)  01/31/2016   CHOLHDL 3.9 01/31/2016   VLDL 29 01/31/2016   LDLCALC 84 01/31/2016   Physical Findings: AIMS: Facial and Oral Movements Muscles of Facial Expression: None, normal Lips and Perioral Area: None, normal Jaw: None, normal Tongue: None, normal,Extremity Movements Upper (arms, wrists, hands, fingers): None, normal Lower (legs, knees, ankles, toes): None, normal, Trunk Movements Neck, shoulders, hips: None, normal, Overall Severity Severity of abnormal movements (highest score from questions above): None, normal Incapacitation due to abnormal movements: None, normal Patient's awareness of abnormal movements (rate only patient's report): No Awareness, Dental Status Current problems with teeth and/or dentures?: No Does patient usually wear dentures?: No  CIWA:  CIWA-Ar Total: 1 COWS:  COWS Total Score: 1  Musculoskeletal: Strength & Muscle Tone: within normal limits Gait & Station: normal Patient leans: N/A  Psychiatric Specialty Exam: Physical Exam  Nursing note and vitals reviewed.   Review of Systems  Psychiatric/Behavioral: Positive for depression. The patient has insomnia.   All other systems reviewed and are negative.   Blood pressure 115/73, pulse 94, temperature 97.7 F (36.5 C), temperature source Oral, resp. rate 16.There is no height or weight on file to calculate BMI.  General Appearance: Fairly Groomed  Eye Contact:  Fair  Speech:  Normal Rate  Volume:  Decreased  Mood:  Anxious, Depressed and Dysphoric  Affect:  Congruent  Thought Process: linear  Orientation:  Other:  place, time  Thought Content:  Paranoid Ideation and Rumination   Suicidal Thoughts:  No  Homicidal Thoughts:  No  Memory:  Immediate;   Fair Recent;   Poor Remote;   Poor  Judgement:  Fair  Insight:  Shallow  Psychomotor Activity:  Decreased  Concentration:  Concentration: Fair and Attention Span: Fair  Recall:  [employer] of Knowledge:  Poor  Language:  Fair  Akathisia:  No   Handed:  Right  AIMS (if indicated):     Assets:  Physical Health Social Support  ADL's:  Intact  Cognition: wnl  Sleep:  Number of Hours: 6.75   Schizophrenia, 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      Treatment Plan Summary:Pt today seen as depressed , has sleep issues , will continue to readjust medications.        Daily contact with patient to assess and evaluate symptoms and progress in treatment, Medication management and Plan see below    For schizophrenia:  Will continue Latuda 40 mg po bid with meals.  For negative sx: Increase Effexor XR to 150 mg po daily.   For insomnia: Discontinue Trazodone . Will start Doxepin 10 mg po qhs. Will give Trazodone 50 mg po qhs prn.   Labs reviewed- lipid panel - wnl , Pl - elevated - will monitor closely.  EKG reviewed - nonspecific T wave abnormality, tachycardia.  Patient encouraged to attend groups , participate in milieu.  CSW will continue to work on disposition.   Namiah Dunnavant, MD 07/23/2016, 12:56 PMPatient ID: Isabella [name], female   DOB: 02-14-1976, 41 y.o.   MRN: [id number]

## 2016-07-23 NOTE — [facility] Group Notes (Signed)
Adult Psychoeducational Group Note  Date:  07/23/2016 Time:  8:00 PM  Group Topic/Focus:  Wrap-Up Group:   The focus of this group is to help patients review their daily goal of treatment and discuss progress on daily workbooks.  Participation Level:  Minimal  Participation Quality:  Inattentive  Affect:  Flat  Cognitive:  Lacking  Insight: Limited  Engagement in Group:  Limited  Modes of Intervention:  Discussion and Education  Additional Comments:  Patient reported not working to achieve any goals during the day.  Leroy Sea N 07/23/2016, 10:51 PM

## 2016-07-23 NOTE — [facility] Group Notes (Signed)
[place] Group Notes: (Clinical Social Work)   07/23/2016      Type of Therapy:  Group Therapy   Participation Level:  Did Not Attend despite MHT prompting   Selmer Dominion, LCSW 07/23/2016, 12:11 PM

## 2016-07-24 MED ORDER — LURASIDONE HCL 60 MG PO TABS
60.0000 mg | ORAL_TABLET | Freq: Two times a day (BID) | ORAL | Status: DC
  Administered 2016-07-25 – 2016-07-26 (×3): 60 mg via ORAL
  Filled 2016-07-24 (×8): qty 1

## 2016-07-24 MED ORDER — MEGESTROL ACETATE 400 MG/10ML PO SUSP
400.0000 mg | Freq: Every day | ORAL | Status: DC
  Administered 2016-07-25 – 2016-07-26 (×2): 400 mg via ORAL
  Filled 2016-07-24 (×5): qty 10

## 2016-07-24 NOTE — [facility] Group Notes (Signed)
[place] Group Notes:  (Clinical Social Work)  07/24/2016  11:00AM-12:00PM  Summary of Progress/Problems:  The main focus of today's process group was to listen to a variety of genres of music and to identify that different types of music provoke different responses.  The patient then was able to identify personally what was soothing for them, as well as energizing, as well as how patient can personally use this knowledge in sleep habits, with depression, and with other symptoms.  The patient came in to group late, was resistant to answering any questions but did remain present throughout gorup.  Type of Therapy:  Music Therapy   Participation Level:  Active  Participation Quality:  Attentive   Affect:  Flat  Cognitive:  Not able to assess  Insight:  Improving  Engagement in Therapy:  Improving  Modes of Intervention:   Activity, Exploration  Selmer Dominion, LCSW 07/24/2016

## 2016-07-24 NOTE — Progress Notes (Signed)
Patient ID: Isabella [name], female   DOB: Aug 02, 1975, 41 y.o.   MRN: [id number]    D: Pt has been very flat and depressed on the unit today, she was in the bed most of the day. Pt did not attend any groups nor did she engage in any treatment. Pt shut down and has refused to eat, drink, or take her medications. All staff have attempted to encourage patient to eat and take her medications, staff also attempted to translate that to patient by way of translator. Despite all efforts patient continued to refuse everything. Dr. Shea Evans made aware that patient had been refusing all meds and foods. Pt was started on medication to increase appetite, patient refused to take. Pt reported that her depression was a 2, her hopelessness was a 3, and her anxiety was a 0. Pt reported that her SI was yes and no, but could contract for safety. Pt reported being negative HI, no AH/VH noted. Pt reported that she had no goal for today. A: 15 min checks continued for patient safety. R: Pt safety maintained.

## 2016-07-24 NOTE — Progress Notes (Signed)
[facility] MD Progress Note  07/24/2016 12:38 PM Isabella [name]  MRN:  [id number]  Subjective:Pt states " I do not feel too good.'      Objective: Patient seen and chart reviewed.Discussed patient with treatment team.  Pt today seen in bed, she is fairly groomed and more verbally communicative when spoke to. Pt also has a better eye contact. However continues to state " I do not feel good." She does not elaborate on her mood sx, but rather states she has back pain. Discussed with pt to make use of pain medications as well as to get out of bed instead of staying in bed the whole day. Pt reports sleep as improved , however continues to have reduced appetite. Pt 's mother per EHR notes - also feels pt is anxious and fidgety and does not think she is back to baseline. Hence will continue to make medication readjustment.        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  Past Medical History:  Past Medical History:  Diagnosis Date  . Bipolar disorder ([place])   . Hypertension   . Migraine 07/13/2016  . Schizophrenia ([place])    History reviewed. No pertinent surgical history. Family History:  Family History  Problem Relation Age of Onset  . Mental illness Neg Hx    Family Psychiatric  History: Please see H&P.  Social History: Please see H&P.  History  Alcohol Use No     History  Drug Use No    Social History   Social History  . Marital status: Single    Spouse name: N/A  . Number of children: N/A  . Years of education: N/A   Social History Main Topics  . Smoking status: Smoker, Current Status Unknown  . Smokeless tobacco: Never Used  . Alcohol use No  . Drug use: No  . Sexual activity: No   Other Topics Concern  . None   Social History  Narrative  . None   Additional Social History:    Pain Medications: See PTA Prescriptions: See PTA Over the Counter: See PTA History of alcohol / drug use?: No history of alcohol / drug abuse  Sleep: Fair  Appetite:  Poor  Current Medications: Current Facility-Administered Medications  Medication Dose Route Frequency Provider Last Rate Last Dose  . acetaminophen (TYLENOL) tablet 650 mg  650 mg Oral Q6H PRN Ethelene Hal, NP   650 mg at 07/19/16 1132  . alum & mag hydroxide-simeth (MAALOX/MYLANTA) 200-200-20 MG/5ML suspension 30 mL  30 mL Oral Q4H PRN Ethelene Hal, NP      . aspirin EC tablet 81 mg  81 mg Oral Daily Laverle Hobby, PA-C   81 mg at 07/24/16 0857  . doxepin (SINEQUAN) capsule 10 mg  10 mg Oral QHS Therma Lasure, MD      . feeding supplement (ENSURE ENLIVE) (ENSURE ENLIVE) liquid 237 mL  237 mL Oral BID BM Aniyah Nobis, MD   237 mL at 07/23/16 1722  . hydrALAZINE (APRESOLINE) tablet 10 mg  10 mg Oral Q6H PRN Laverle Hobby, PA-C      . hydrOXYzine (ATARAX/VISTARIL) tablet 25 mg  25 mg Oral Q6H PRN Ethelene Hal, NP   25 mg at 07/17/16 2204  . lidocaine (LIDODERM) 5 %  1 patch  1 patch Transdermal Daily Ursula Alert, MD   1 patch at 07/24/16 0858  . LORazepam (ATIVAN) tablet 0.5 mg  0.5 mg Oral Daily PRN Ursula Alert, MD      . losartan (COZAAR) tablet 25 mg  25 mg Oral Daily Laverle Hobby, PA-C   25 mg at 07/23/16 0825  . Lurasidone HCl TABS 60 mg  60 mg Oral BID WC Jahlen Bollman, MD      . magnesium hydroxide (MILK OF MAGNESIA) suspension 30 mL  30 mL Oral Daily PRN Ethelene Hal, NP      . propranolol (INDERAL) tablet 10 mg  10 mg Oral BID Ursula Alert, MD   10 mg at 07/23/16 0825  . traZODone (DESYREL) tablet 50 mg  50 mg Oral QHS PRN Ursula Alert, MD      . venlafaxine XR (EFFEXOR-XR) 24 hr capsule 150 mg  150 mg Oral Q breakfast Ursula Alert, MD   150 mg at 07/24/16 0857   Lab Results: No results found for this or any  previous visit (from the past 48 hour(s)).  Blood Alcohol level:  Lab Results  Component Value Date   ETH <5 94/70/9628   Metabolic Disorder Labs: Lab Results  Component Value Date   HGBA1C 5.4 01/31/2016   MPG 108 01/31/2016   Lab Results  Component Value Date   PROLACTIN 124.2 (H) 01/31/2016   Lab Results  Component Value Date   CHOL 152 01/31/2016   TRIG 147 01/31/2016   HDL 39 (L) 01/31/2016   CHOLHDL 3.9 01/31/2016   VLDL 29 01/31/2016   LDLCALC 84 01/31/2016   Physical Findings: AIMS: Facial and Oral Movements Muscles of Facial Expression: None, normal Lips and Perioral Area: None, normal Jaw: None, normal Tongue: None, normal,Extremity Movements Upper (arms, wrists, hands, fingers): None, normal Lower (legs, knees, ankles, toes): None, normal, Trunk Movements Neck, shoulders, hips: None, normal, Overall Severity Severity of abnormal movements (highest score from questions above): None, normal Incapacitation due to abnormal movements: None, normal Patient's awareness of abnormal movements (rate only patient's report): No Awareness, Dental Status Current problems with teeth and/or dentures?: No Does patient usually wear dentures?: No  CIWA:  CIWA-Ar Total: 1 COWS:  COWS Total Score: 1  Musculoskeletal: Strength & Muscle Tone: within normal limits Gait & Station: normal Patient leans: N/A  Psychiatric Specialty Exam: Physical Exam  Nursing note and vitals reviewed.   Review of Systems  Musculoskeletal: Positive for back pain.  Psychiatric/Behavioral: Positive for depression. The patient is nervous/anxious.   All other systems reviewed and are negative.   Blood pressure 100/68, pulse 97, temperature 97.8 F (36.6 C), temperature source Oral, resp. rate 16.There is no height or weight on file to calculate BMI.  General Appearance: Fairly Groomed  Eye Contact:  Fair  Speech:  Normal Rate  Volume:  Decreased  Mood:  Anxious, Depressed and Dysphoric   Affect:  Congruent  Thought Process: goal directed  Orientation:  Other:  self, situation  Thought Content:  Paranoid Ideation and Rumination guarded  Suicidal Thoughts:  No  Homicidal Thoughts:  No  Memory:  Immediate;   Fair Recent;   Poor Remote;   Poor  Judgement:  Fair  Insight:  Shallow  Psychomotor Activity:  Decreased  Concentration:  Concentration: Fair and Attention Span: Fair  Recall:  [employer] of Knowledge:  Poor  Language:  Fair  Akathisia:  No  Handed:  Right  AIMS (if indicated):  Assets:  Physical Health Social Support  ADL's:  Intact  Cognition: wnl  Sleep:  Number of Hours: 5.5   Schizophrenia, 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       Treatment Plan Summary:Pt seen as depressed, anxious, somatic, has back pain issues , has low appetite , continues to need encouragement to participate in milieu , will continue treatment.        Daily contact with patient to assess and evaluate symptoms and progress in treatment, Medication management and Plan see below    For schizophrenia: Will increase Latuda to 60 mg po bid with meals.   For negative sx: Will continue Effexor XR 150 mg po daily.   For insomnia: Doxepin 10 mg po qhs.  For appetite : Megace 400 mg  po daily .   Labs reviewed- lipid panel - wnl , Pl - elevated - will monitor closely.  EKG reviewed - nonspecific T wave abnormality, tachycardia.  Patient encouraged to attend groups , participate in milieu.  CSW will continue to work on disposition.   Brentlee Sciara, MD 07/24/2016, 12:38 PMPatient ID: Isabella [name], female   DOB: 1975/12/25, 41 y.o.   MRN: [id number]

## 2016-07-24 NOTE — Progress Notes (Signed)
Psychoeducational Group Note  Date:  07/24/2016 Time:  2137  Group Topic/Focus:  Recovery Goals:   The focus of this group is to identify appropriate goals for recovery and establish a plan to achieve them. Wrap-Up Group:   The focus of this group is to help patients review their daily goal of treatment and discuss progress on daily workbooks.  Participation Level: Did Not Attend  Participation Quality:  Not Applicable  Affect:  Not Applicable  Cognitive:  Not Applicable  Insight:  Not Applicable  Engagement in Group: Not Applicable  Additional Comments:  The patient did not attend group this evening.   Archie Balboa S 07/24/2016, 9:37 PM

## 2016-07-25 DIAGNOSIS — F202 Catatonic schizophrenia: Principal | ICD-10-CM

## 2016-07-25 NOTE — Progress Notes (Signed)
D: Pt was in the dayroom upon initial approach.  Pt presents with depressed affect and mood.  Met with pt and communicated via interpreter.  Pt reports her day was "so so" and she had a good visit with her brother tonight.  She reports she did not eat today and has had decreased appetite.  She did, however, drink an ensure when offered.  Pt denies having a goal for the day.  Pt denies SI/HI, denies hallucinations, denies pain.  Pt has been visible in milieu at times.    A: Introduced self to pt.  Actively listened to pt and offered support and encouragement. Medication administered per order.  PRN medication administered for sleep.  Q15 minute safety checks maintained.  R: Pt is safe on the unit.  Pt is compliant with medication.  Pt verbally contracts for safety.  Will continue to monitor and assess.   

## 2016-07-25 NOTE — Progress Notes (Signed)
DAR NOTE: Patient presents with depressed affect and mood.  Denies auditory and visual hallucinations.  Described energy level as low and concentration as poor.  Rates depression at 3, hopelessness at 3, and anxiety at 3.  Maintained on routine safety checks.  Medications given as prescribed.  Support and encouragement offered as needed.  Attended group and was attentive.  Patient was visible in milieu in the afternoon for activity and therapy.  Patient is withdrawn and isolative to her room. Food and fluid intake is poor.  Staff offered and encourage meal and fluid intake.  Patient continues to need lots of encouragement with medications and ADL's.

## 2016-07-25 NOTE — BHH Group Notes (Signed)
[place] LCSW Group Therapy  07/25/2016 , 3:42 PM   Type of Therapy:  Group Therapy  Participation Level:  Active  Participation Quality:  Attentive  Affect:  Appropriate  Cognitive:  Alert  Insight:  Improving  Engagement in Therapy:  Engaged  Modes of Intervention:  Discussion, Exploration and Socialization  Summary of Progress/Problems: Today's group focused on the term Diagnosis.  Participants were asked to define the term, and then pronounce whether it is a negative, positive or neutral term.  Lying in bed, declined to attend.  Showed up 15 minutes later with interpreter.  Sat quietly.  Did not respond when asked questions directly.  Isabella [name] 07/25/2016 , 3:42 PM

## 2016-07-25 NOTE — Progress Notes (Signed)
D: Pt was in bed in her room upon initial approach.  Pt presents with depressed affect and mood.  Spoke with pt via interpreter.  Pt reports "I feel good."  She denies having a goal tonight.  She reports she did not eat today but has drank ensure.  Pt denies SI/HI, denies hallucinations, denies pain.  Pt attended evening group with encouragement from staff.  She stayed in the dayroom for a short amount of time after group as well.  Pt reports she slept "a little" last night.    A: Introduced self to pt.  Encouragement and support offered.  Medication administered per order.  PRN medication administered for sleep.  Q15 minute safety checks maintained.  PO fluids encouraged and pt drank a bottle of Ensure and most of a Gatorade.    R: Pt is safe on the unit.  Pt is compliant with medications.  Pt verbally contracts for safety.  Will continue to monitor and assess.

## 2016-07-25 NOTE — Progress Notes (Signed)
[facility] Group Notes:  (Nursing/MHT/Case Management/Adjunct)  Date:  07/25/2016  Time:  9:37 PM  Type of Therapy:  Psychoeducational Skills  Participation Level:  None  Participation Quality:  Resistant  Affect:  Irritable  Cognitive:  Lacking  Insight:  None  Engagement in Group:  None  Modes of Intervention:  Education  Summary of Progress/Problems: The patient attended group but refused to share.   Archie Balboa S 07/25/2016, 9:37 PM

## 2016-07-25 NOTE — Progress Notes (Signed)
Recreation Therapy Notes  Date: 07/25/16 Time: 1000 Location: 500 Hall Dayroom  Group Topic: Coping Skills  Goal Area(s) Addresses:  Patients will be able to identify positive coping skills. Patients will be able to identify the benefits of using coping skills post d/c.  Intervention: Pencils, blank mindmap  Activity: Coping Skills Mindmap.  Patients were given a blank mindmap.  There were eight boxes to be filled in with various things that cause [place] to use coping skills.  Patients and LRT filled out these eight boxes together with things such as depression, anxiety, sleeplessness, etc.  Each of these eight boxes has three boxes extending from them.  The patients were to come up with three coping skills for each of the eight symptoms individually.  The group would then come back together and go over the coping skills.  Education: Radiographer, therapeutic, Dentist.   Education Outcome: Acknowledges understanding/In group clarification offered/Needs additional education.   Clinical Observations/Feedback: Pt did not attend group.   Victorino Sparrow, LRT/CTRS         Victorino Sparrow A 07/25/2016 11:58 AM

## 2016-07-25 NOTE — Progress Notes (Signed)
[facility] MD Progress Note  07/25/2016 2:29 PM Shaylea Ucci  MRN:  [id number]  Subjective: Patient response are  "I don't know"  Objective:Patient evaluated with [place] interpreter. However patient understands and speaks english. Patient appears flat, guarded and thought blocking with responses. Per staffing notes patient needs a lot of redirection and encouragement for medications and ADL's. Patient responses is "I don't know." Malary Aylesworth is awake, alert with complaints of lower back pain. Support, encouragement and reassurance was provided.    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  Past Medical History:  Past Medical History:  Diagnosis Date  . Bipolar disorder ([place])   . Hypertension   . Migraine 07/13/2016  . Schizophrenia ([place])    History reviewed. No pertinent surgical history. Family History:  Family History  Problem Relation Age of Onset  . Mental illness Neg Hx    Family Psychiatric  History: Please see H&P.  Social History: Please see H&P.  History  Alcohol Use No     History  Drug Use No    Social History   Social History  . Marital status: Single    Spouse name: N/A  . Number of children: N/A  . Years of education: N/A   Social History Main Topics  . Smoking status: Smoker, Current Status Unknown  . Smokeless tobacco: Never Used  . Alcohol use No  . Drug use: No  . Sexual activity: No   Other Topics Concern  . None   Social History Narrative  . None   Additional Social History:    Pain Medications: See PTA Prescriptions: See PTA Over the Counter: See PTA History of alcohol / drug use?: No history of alcohol / drug abuse  Sleep: Fair  Appetite:  Poor  Current Medications: Current Facility-Administered  Medications  Medication Dose Route Frequency Provider Last Rate Last Dose  . acetaminophen (TYLENOL) tablet 650 mg  650 mg Oral Q6H PRN Ethelene Hal, NP   650 mg at 07/19/16 1132  . alum & mag hydroxide-simeth (MAALOX/MYLANTA) 200-200-20 MG/5ML suspension 30 mL  30 mL Oral Q4H PRN Ethelene Hal, NP      . aspirin EC tablet 81 mg  81 mg Oral Daily Laverle Hobby, PA-C   81 mg at 07/25/16 0819  . doxepin (SINEQUAN) capsule 10 mg  10 mg Oral QHS Ursula Alert, MD   10 mg at 07/24/16 2045  . feeding supplement (ENSURE ENLIVE) (ENSURE ENLIVE) liquid 237 mL  237 mL Oral BID BM Saramma Eappen, MD   237 mL at 07/25/16 0821  . hydrALAZINE (APRESOLINE) tablet 10 mg  10 mg Oral Q6H PRN Laverle Hobby, PA-C      . hydrOXYzine (ATARAX/VISTARIL) tablet 25 mg  25 mg Oral Q6H PRN Ethelene Hal, NP   25 mg at 07/17/16 2204  . lidocaine (LIDODERM) 5 % 1 patch  1 patch Transdermal Daily Ursula Alert, MD   1 patch at 07/25/16 0819  . LORazepam (ATIVAN) tablet 0.5 mg  0.5 mg Oral Daily PRN Ursula Alert, MD      . losartan (COZAAR) tablet 25 mg  25 mg Oral Daily Laverle Hobby, PA-C   25 mg at 07/25/16 0819  . Lurasidone HCl TABS 60 mg  60 mg Oral BID WC  Ursula Alert, MD   60 mg at 07/25/16 0819  . magnesium hydroxide (MILK OF MAGNESIA) suspension 30 mL  30 mL Oral Daily PRN Ethelene Hal, NP      . megestrol (MEGACE) 400 MG/10ML suspension 400 mg  400 mg Oral Daily Ursula Alert, MD   400 mg at 07/25/16 0819  . propranolol (INDERAL) tablet 10 mg  10 mg Oral BID Ursula Alert, MD   10 mg at 07/25/16 0819  . traZODone (DESYREL) tablet 50 mg  50 mg Oral QHS PRN Ursula Alert, MD   50 mg at 07/24/16 2045  . venlafaxine XR (EFFEXOR-XR) 24 hr capsule 150 mg  150 mg Oral Q breakfast Ursula Alert, MD   150 mg at 07/25/16 0254   Lab Results: No results found for this or any previous visit (from the past 48 hour(s)).  Blood Alcohol level:  Lab Results  Component Value Date   ETH  <5 27/10/2374   Metabolic Disorder Labs: Lab Results  Component Value Date   HGBA1C 5.4 01/31/2016   MPG 108 01/31/2016   Lab Results  Component Value Date   PROLACTIN 124.2 (H) 01/31/2016   Lab Results  Component Value Date   CHOL 152 01/31/2016   TRIG 147 01/31/2016   HDL 39 (L) 01/31/2016   CHOLHDL 3.9 01/31/2016   VLDL 29 01/31/2016   LDLCALC 84 01/31/2016   Physical Findings: AIMS: Facial and Oral Movements Muscles of Facial Expression: None, normal Lips and Perioral Area: None, normal Jaw: None, normal Tongue: None, normal,Extremity Movements Upper (arms, wrists, hands, fingers): None, normal Lower (legs, knees, ankles, toes): None, normal, Trunk Movements Neck, shoulders, hips: None, normal, Overall Severity Severity of abnormal movements (highest score from questions above): None, normal Incapacitation due to abnormal movements: None, normal Patient's awareness of abnormal movements (rate only patient's report): No Awareness, Dental Status Current problems with teeth and/or dentures?: No Does patient usually wear dentures?: No  CIWA:  CIWA-Ar Total: 1 COWS:  COWS Total Score: 1  Musculoskeletal: Strength & Muscle Tone: within normal limits Gait & Station: normal Patient leans: N/A  Psychiatric Specialty Exam: Physical Exam  Nursing note and vitals reviewed. Neurological: She is alert.  Psychiatric: She has a normal mood and affect. Her behavior is normal.    Review of Systems  Musculoskeletal: Positive for back pain.  Psychiatric/Behavioral: Positive for depression. The patient is nervous/anxious.   All other systems reviewed and are negative.   Blood pressure 111/63, pulse 83, temperature 97.8 F (36.6 C), temperature source Oral, resp. rate 16.There is no height or weight on file to calculate BMI.  General Appearance: Fairly Groomed and Guarded  Eye Contact:  Fair  Speech:  Normal Rate  Volume:  Decreased  Mood:  Anxious, Depressed and Dysphoric   Affect:  Congruent  Thought Process: goal directed  Orientation:  Other:  self, situation  Thought Content:  Paranoid Ideation and Rumination guarded  Suicidal Thoughts:  No  Homicidal Thoughts:  No  Memory:  Immediate;   Fair Recent;   Poor Remote;   Poor  Judgement:  Fair  Insight:  Shallow  Psychomotor Activity:  Decreased  Concentration:  Concentration: Fair and Attention Span: Fair  Recall:  [employer] of Knowledge:  Poor  Language:  Fair  Akathisia:  No  Handed:  Right  AIMS (if indicated):     Assets:  Physical Health Social Support  ADL's:  Intact  Cognition: wnl  Sleep:  Number of Hours: 5.5  I agree with current treatment plan on 07/25/2016, Patient seen face-to-face for psychiatric evaluation follow-up, chart reviewed.. Reviewed the information documented and agree with the treatment plan.  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  Treatment Plan Summary:   Daily contact with patient to assess and evaluate symptoms and progress in treatment, Medication management and Plan see below    For schizophrenia: Will contiune Latuda to 60 mg po bid with meals.  For negative sx: Will continue Effexor XR 150 mg po daily.   For insomnia: Doxepin 10 mg po qhs.  For appetite : Megace 400 mg  po daily .  Labs reviewed- lipid panel - wnl , Pl - elevated - will monitor closely.  EKG reviewed - nonspecific T wave abnormality, tachycardia.  Patient encouraged to attend groups , participate in milieu.  CSW will continue to work on disposition.  Derrill [place], NP 07/25/2016, 2:29 PM

## 2016-07-26 MED ORDER — DOXEPIN HCL 25 MG PO CAPS
25.0000 mg | ORAL_CAPSULE | Freq: Every day | ORAL | Status: DC
  Administered 2016-07-26 – 2016-08-01 (×7): 25 mg via ORAL
  Filled 2016-07-26 (×10): qty 1

## 2016-07-26 MED ORDER — MEGESTROL ACETATE 400 MG/10ML PO SUSP
400.0000 mg | Freq: Two times a day (BID) | ORAL | Status: DC
  Administered 2016-07-26 – 2016-08-02 (×14): 400 mg via ORAL
  Filled 2016-07-26 (×18): qty 10

## 2016-07-26 MED ORDER — LURASIDONE HCL 40 MG PO TABS
40.0000 mg | ORAL_TABLET | Freq: Every day | ORAL | Status: DC
  Administered 2016-07-27: 40 mg via ORAL
  Filled 2016-07-26 (×2): qty 1

## 2016-07-26 MED ORDER — OLANZAPINE 5 MG PO TABS
5.0000 mg | ORAL_TABLET | Freq: Every day | ORAL | Status: DC
  Administered 2016-07-26: 5 mg via ORAL
  Filled 2016-07-26 (×2): qty 1
  Filled 2016-07-26: qty 2

## 2016-07-26 NOTE — Progress Notes (Signed)
Recreation Therapy Notes  Date: 07/26/16 Time: 1000 Location: 500 Hall Dayroom  Group Topic: Communication  Goal Area(s) Addresses:  Patient will effectively communicate with peers in group.  Patient will verbalize benefit of healthy communication. Patient will verbalize positive effect of healthy communication on post d/c goals.  Patient will identify communication techniques that made activity effective for group.   Behavioral Response: Minimal  Intervention:  Chairs, stopwatch, 3 small beach balls   Activity: Group Juggle.  Patients were placed in a circle.  Patients were given one small beach ball to toss back and forth between each other.  After some time, LRT would add another ball to the mix.  Patients were to now keep two balls moving within the circle.  After more time had passed, a third ball was added to the mix.  The balls could be bounced off the floor but could not come to a stop.  While the patients are tossing the balls within the circle, LRT is timing how long it takes for the balls to come to a complete stop.  If the balls come to a stop, LRT resets the the.  Education: Communication, Discharge Planning  Education Outcome: Acknowledges understanding/In group clarification offered/Needs additional education.   Clinical Observations/Feedback: Pt arrived late to group.  Pt was flat.  Pt participated with prompting from interpreter and staff.  Pt gave minimal participation.   Victorino Sparrow, LRT/CTRS      Victorino Sparrow A 07/26/2016 11:58 AM

## 2016-07-26 NOTE — Plan of Care (Signed)
Problem: Activity: Goal: Sleeping patterns will improve Outcome: Not Progressing Pt reports she slept "a little" last night.  She slept 5.5 hours according to flowsheet.

## 2016-07-26 NOTE — BHH Group Notes (Signed)
[place] LCSW Group Therapy  07/26/2016 1:15 pm  Type of Therapy: Process Group Therapy  Participation Level:  Active  Participation Quality:  Appropriate  Affect:  Flat  Cognitive:  Oriented  Insight:  Improving  Engagement in Group:  Limited  Engagement in Therapy:  Limited  Modes of Intervention:  Activity, Clarification, Education, Problem-solving and Support  Summary of Progress/Problems: Today's group addressed the issue of overcoming obstacles.  Patients were asked to identify their biggest obstacle post d/c that stands in the way of their on-going success, and then problem solve as to how to manage this. Came in late with interpreter, stayed the rest of the time.  Unable to identify any obstacles.  Non-verbal.  Stared at me when asked directly, did not acknowledge my interaction with her.  Roque Lias B 07/26/2016   1:29 PM

## 2016-07-26 NOTE — Progress Notes (Signed)
[facility] MD Progress Note  07/26/2016 4:10 PM Isabella [name]  MRN:  [id number]  Subjective:Pt states " I do not feel good."      Objective: Patient seen and chart reviewed.Discussed patient with treatment team.  Pt reports feeling anxious and also having sleep issues. Per RN , pt observed as sleeping at night , and also stays to self during the day. Pt also observed as having lack of appetite and requiring encouragement to take meals. Pt needs encouragement to attend groups and participate. Will continue to make medication changes.         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  Past Medical History:  Past Medical History:  Diagnosis Date  . Bipolar disorder ([place])   . Hypertension   . Migraine 07/13/2016  . Schizophrenia ([place])    History reviewed. No pertinent surgical history. Family History:  Family History  Problem Relation Age of Onset  . Mental illness Neg Hx    Family Psychiatric  History: Please see H&P.  Social History: Please see H&P.  History  Alcohol Use No     History  Drug Use No    Social History   Social History  . Marital status: Single    Spouse name: N/A  . Number of children: N/A  . Years of education: N/A   Social History Main Topics  . Smoking status: Smoker, Current Status Unknown  . Smokeless tobacco: Never Used  . Alcohol use No  . Drug use: No  . Sexual activity: No   Other Topics Concern  . None   Social History Narrative  . None   Additional Social History:    Pain Medications: See PTA Prescriptions: See PTA Over the Counter: See PTA History of alcohol / drug use?: No history of alcohol / drug abuse  Sleep: Poor  Appetite:  Poor  Current Medications: Current Facility-Administered  Medications  Medication Dose Route Frequency Provider Last Rate Last Dose  . acetaminophen (TYLENOL) tablet 650 mg  650 mg Oral Q6H PRN Ethelene Hal, NP   650 mg at 07/19/16 1132  . alum & mag hydroxide-simeth (MAALOX/MYLANTA) 200-200-20 MG/5ML suspension 30 mL  30 mL Oral Q4H PRN Ethelene Hal, NP      . aspirin EC tablet 81 mg  81 mg Oral Daily Laverle Hobby, PA-C   81 mg at 07/26/16 0826  . doxepin (SINEQUAN) capsule 25 mg  25 mg Oral QHS Lucina Betty, MD      . feeding supplement (ENSURE ENLIVE) (ENSURE ENLIVE) liquid 237 mL  237 mL Oral BID BM Hanni Milford, MD   237 mL at 07/26/16 1042  . hydrALAZINE (APRESOLINE) tablet 10 mg  10 mg Oral Q6H PRN Laverle Hobby, PA-C      . hydrOXYzine (ATARAX/VISTARIL) tablet 25 mg  25 mg Oral Q6H PRN Ethelene Hal, NP   25 mg at 07/17/16 2204  . lidocaine (LIDODERM) 5 % 1 patch  1 patch Transdermal Daily Ursula Alert, MD   1 patch at 07/26/16 0827  . LORazepam (ATIVAN) tablet 0.5 mg  0.5 mg Oral Daily PRN Ursula Alert, MD      . losartan (COZAAR) tablet 25 mg  25 mg Oral Daily Laverle Hobby, PA-C   25 mg at 07/26/16  0827  . Lurasidone HCl TABS 60 mg  60 mg Oral BID WC Ursula Alert, MD   60 mg at 07/26/16 0828  . magnesium hydroxide (MILK OF MAGNESIA) suspension 30 mL  30 mL Oral Daily PRN Ethelene Hal, NP      . megestrol (MEGACE) 400 MG/10ML suspension 400 mg  400 mg Oral Daily Ursula Alert, MD   400 mg at 07/26/16 0828  . propranolol (INDERAL) tablet 10 mg  10 mg Oral BID Ursula Alert, MD   10 mg at 07/26/16 0828  . traZODone (DESYREL) tablet 50 mg  50 mg Oral QHS PRN Ursula Alert, MD   50 mg at 07/25/16 2039  . venlafaxine XR (EFFEXOR-XR) 24 hr capsule 150 mg  150 mg Oral Q breakfast Ursula Alert, MD   150 mg at 07/26/16 6295   Lab Results: No results found for this or any previous visit (from the past 48 hour(s)).  Blood Alcohol level:  Lab Results  Component Value Date   ETH <5 28/41/3244    Metabolic Disorder Labs: Lab Results  Component Value Date   HGBA1C 5.4 01/31/2016   MPG 108 01/31/2016   Lab Results  Component Value Date   PROLACTIN 124.2 (H) 01/31/2016   Lab Results  Component Value Date   CHOL 152 01/31/2016   TRIG 147 01/31/2016   HDL 39 (L) 01/31/2016   CHOLHDL 3.9 01/31/2016   VLDL 29 01/31/2016   LDLCALC 84 01/31/2016   Physical Findings: AIMS: Facial and Oral Movements Muscles of Facial Expression: None, normal Lips and Perioral Area: None, normal Jaw: None, normal Tongue: None, normal,Extremity Movements Upper (arms, wrists, hands, fingers): None, normal Lower (legs, knees, ankles, toes): None, normal, Trunk Movements Neck, shoulders, hips: None, normal, Overall Severity Severity of abnormal movements (highest score from questions above): None, normal Incapacitation due to abnormal movements: None, normal Patient's awareness of abnormal movements (rate only patient's report): No Awareness, Dental Status Current problems with teeth and/or dentures?: No Does patient usually wear dentures?: No  CIWA:  CIWA-Ar Total: 1 COWS:  COWS Total Score: 1  Musculoskeletal: Strength & Muscle Tone: within normal limits Gait & Station: normal Patient leans: N/A  Psychiatric Specialty Exam: Physical Exam  Nursing note and vitals reviewed.   Review of Systems  Musculoskeletal: Positive for back pain.  Psychiatric/Behavioral: Positive for depression. The patient is nervous/anxious and has insomnia.   All other systems reviewed and are negative.   Blood pressure 120/82, pulse 92, temperature 98.4 F (36.9 C), temperature source Oral, resp. rate 16.There is no height or weight on file to calculate BMI.  General Appearance: Fairly Groomed  Eye Contact:  Fair  Speech:  Normal Rate  Volume:  Decreased  Mood:  Anxious, Depressed and Dysphoric  Affect:  Congruent  Thought Process: goal directed  Orientation:  Other:  self, situation  Thought Content:   Paranoid Ideation and Rumination guarded  Suicidal Thoughts:  No  Homicidal Thoughts:  No  Memory:  Immediate;   Fair Recent;   Poor Remote;   Poor  Judgement:  Fair  Insight:  Shallow  Psychomotor Activity:  Decreased  Concentration:  Concentration: Fair and Attention Span: Fair  Recall:  [employer] of Knowledge:  Poor  Language:  Fair  Akathisia:  No  Handed:  Right  AIMS (if indicated):     Assets:  Physical Health Social Support  ADL's:  Intact  Cognition: wnl  Sleep:  Number of Hours: 6.75   Schizophrenia, catatonic  type [facility]) unstable  Will continue today 07/26/16 plan as below except where it is noted.       Treatment Plan Summary:Pt today seen as anxious , depressed, has poor appetite , sleep problems , and is mostly isoaltive. Will make changes to medications and observe on the unit.      Daily contact with patient to assess and evaluate symptoms and progress in treatment, Medication management and Plan see below    For schizophrenia: Crosstaper Latuda with zyprexa. Start Zyprexa 5 mg po qhs.    For negative sx: Will continue Effexor XR 150 mg po daily.   For insomnia: Increase Doxepin to 25 mg po qhs.   For appetite: Increase Megace to 400 mg po bid.   Labs reviewed- lipid panel - wnl , Pl - elevated - will monitor closely.  EKG reviewed - nonspecific T wave abnormality, tachycardia.  Patient encouraged to attend groups , participate in milieu.  CSW will continue to work on disposition.   Joanne Salah, MD 07/26/2016, 4:10 PMPatient ID: Isabella [name], female   DOB: August 28, 1975, 41 y.o.   MRN: [id number]

## 2016-07-26 NOTE — Progress Notes (Signed)
DAR NOTE: Patient presents with depressed affect and mood.  Denies auditory and visual hallucinations.  Described energy level as low and concentration as poor.  Rates depression at 2, hopelessness at 2, and anxiety at 2.  Maintained on routine safety checks.  Medications given as prescribed.  Support and encouragement offered as needed.  Attended group and participated.  Patient was visible in milieu for activity and therapy.  Appetite fair, fluid intake encouraged.

## 2016-07-27 MED ORDER — OLANZAPINE 10 MG PO TABS
10.0000 mg | ORAL_TABLET | Freq: Every day | ORAL | Status: DC
  Administered 2016-07-27 – 2016-07-31 (×5): 10 mg via ORAL
  Filled 2016-07-27 (×6): qty 1

## 2016-07-27 NOTE — Progress Notes (Signed)
Adult Psychoeducational Group Note  Date:  07/27/2016 Time:  11:16 PM  Group Topic/Focus:  Wrap-Up Group:   The focus of this group is to help patients review their daily goal of treatment and discuss progress on daily workbooks.  Participation Level:  Minimal  Participation Quality:  lacking  Affect:  Flat  Cognitive:  Alert  Insight: Lacking  Engagement in Group:  Poor  Modes of Intervention:  Discussion  Additional Comments:  With the help of an interpreter, pt stated that she did not have any goals.  [name] R 07/27/2016, 11:16 PM

## 2016-07-27 NOTE — [facility] Group Notes (Signed)
[place] Group Notes:  (Counselor/Nursing/MHT/Case Management/Adjunct)  07/27/2016 1:15PM  Type of Therapy:  Group Therapy  Participation Level:  Active  Participation Quality:  Appropriate  Affect:  Flat  Cognitive:  Oriented  Insight:  Improving  Engagement in Group:  Limited  Engagement in Therapy:  Limited  Modes of Intervention:  Discussion, Exploration and Socialization  Summary of Progress/Problems: The topic for group was balance in life.  Pt participated in the discussion about when their life was in balance and out of balance and how this feels.  Pt discussed ways to get back in balance and short term goals they can work on to get where they want to be. "I am unblanced today."  Unable to say why she feels that way, or how she knows.  Asked direct questions several more times, declined to answer.   Roque Lias B 07/27/2016 1:09 PM

## 2016-07-27 NOTE — Progress Notes (Signed)
D: Pt A & O X3. Presents with depressed affect and mood. Remains selectively mute and with drawned to her room. Assessed via interpreter this AM, denies SI, HI, AVH and pain, appeared thought blocking at the time. Continues to require multiple redirections to come out of her room and participate in group activities and to go to the cafeteria for meals. Showered and changed clothes this shift with multiple prompts. Rates her depression, hopelessness and anxiety all 2/10. Reported fair sleep, poor appetite, low energy and poor concentration level. A: Emotional support and availability provided to pt. Medications administered per physician order and effects monitored. Encouraged pt to voice concerns, attend groups and comply with his treatment plan. Q 15 minutes safety checks maintained on and off unit without incident.   R: Pt has been medication compliant. Attended groups and went for meals with increased verbal encouragement. POC maintained for mood stability / safety, pt monitored as such.

## 2016-07-27 NOTE — Plan of Care (Signed)
Problem: Safety: Goal: Periods of time without injury will increase Outcome: Progressing Pt. remains a low fall risk, denies SI/HI/AVH, Q 15 checks in effect.

## 2016-07-27 NOTE — Progress Notes (Signed)
Recreation Therapy Notes  Date: 07/27/16 Time: 1000 Location: [address] Dayroom  Group Topic: Self-Esteem  Goal Area(s) Addresses:  Patient will identify positive ways to increase self-esteem. Patient will verbalize benefit of increased self-esteem.  Behavioral Response: None  Intervention: Blank crest, colored pencils  Activity: Crest of Arms.  Patients were given a blank crest divided into four sections.  In each section, patients were to identify something positive about themselves.  Patients could choose from categories such as biggest accomplishment, proudest moment, best feature, etc or come up their own category.  Patients were to draw a picture to give their words a visual.  Education:  Self-Esteem, Discharge Planning.   Education Outcome: Acknowledges education/In group clarification offered/Needs additional education  Clinical Observations/Feedback: Pt was quiet and flat.  Pt did not participate even with prompting from LRT and interpretor.  Pt answered "I don't know" to every question.    Victorino Sparrow, LRT/CTRS    Victorino Sparrow A 07/27/2016 12:46 PM

## 2016-07-27 NOTE — Progress Notes (Signed)
Interpreter present during visitation time to [employer]. Was helpful.

## 2016-07-27 NOTE — Progress Notes (Signed)
  DATA ACTION RESPONSE  Objective- Pt. is up and visible in the milieu, sitting quietly to herself glaring at others. Pt. presents with a depressed affect and mood. Pt. was minimal & guarded with interaction; even with family members present. Pt. minimizes s/s and provide one worded responses. Requires encouragement for activities and self care. Subjective- Denies having any SI/HI/AVH/Pain at this time. Pt. states " It's good". Pt. continues to be cooperative and remain safe on the unit.  1:1 interaction in private to establish rapport. Encouragement, education, & support given from staff. Meds. ordered and administered. Ensure offered and accepted.   Safety maintained with Q 15 checks. Continues to follow treatment plan and will monitor closely. No additonal questions/concerns noted.

## 2016-07-27 NOTE — Progress Notes (Signed)
D: Pt was flat and withdrawn to self even while in dayroom. Pt as of the time of assessment endorses moderate depression, anxiety and mild lower back pain; she states, "I feel better." Pt however, denied SI, HI or AVH. Pt remained calm and cooperative. A: Medications offered as prescribed. All patient's questions and concerns addressed. Support, encouragement, and safe environment provided. 15-minute safety checks continue. R: Pt was med compliant.  Pt attended wrap-up group. Safety checks continue

## 2016-07-27 NOTE — Tx Team (Signed)
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 acetaminophen (TYLENOL) tablet 650 mg  650 mg Oral Q6H PRN Ethelene Hal, NP   650 mg at 07/19/16 1132  . alum & mag hydroxide-simeth (MAALOX/MYLANTA) 200-200-20 MG/5ML suspension 30 mL  30 mL Oral Q4H PRN Ethelene Hal, NP      . aspirin EC tablet 81 mg  81 mg Oral Daily Laverle Hobby, PA-C   81 mg at 07/27/16 0354  . doxepin (SINEQUAN) capsule 25 mg  25 mg Oral QHS Ursula Alert, MD   25 mg at 07/26/16 2144  . feeding supplement (ENSURE ENLIVE) (ENSURE ENLIVE) liquid 237 mL  237 mL Oral BID BM Saramma Eappen, MD   237 mL at 07/26/16 1600  . hydrALAZINE (APRESOLINE) tablet 10 mg  10 mg Oral Q6H PRN Laverle Hobby, PA-C      . hydrOXYzine (ATARAX/VISTARIL) tablet 25 mg  25 mg Oral Q6H PRN Ethelene Hal, NP   25 mg at 07/17/16 2204  . lidocaine (LIDODERM) 5 % 1 patch  1 patch Transdermal Daily Ursula Alert, MD   1 patch at 07/27/16 0840  . LORazepam (ATIVAN) tablet 0.5 mg  0.5 mg Oral Daily PRN Ursula Alert, MD      . losartan (COZAAR) tablet 25 mg  25 mg Oral Daily Laverle Hobby, PA-C   25 mg at 07/27/16 0839  . magnesium hydroxide (MILK OF MAGNESIA) suspension 30 mL  30 mL Oral Daily PRN Ethelene Hal, NP      . megestrol (MEGACE) 400 MG/10ML suspension 400 mg  400 mg Oral BID Ursula Alert, MD   400 mg at 07/27/16 0839  . OLANZapine (ZYPREXA) tablet 10 mg  10 mg Oral QHS Saramma Eappen, MD      . propranolol (INDERAL) tablet 10 mg  10 mg Oral BID Ursula Alert, MD   10 mg at 07/27/16 6568  . traZODone (DESYREL) tablet 50 mg  50 mg Oral QHS PRN Ursula Alert, MD    50 mg at 07/25/16 2039  . venlafaxine XR (EFFEXOR-XR) 24 hr capsule 150 mg  150 mg Oral Q breakfast Ursula Alert, MD   150 mg at 07/27/16 1275    PTA Medications: Prescriptions Prior to Admission  Medication Sig Dispense Refill Last Dose  . lurasidone (LATUDA) 40 MG TABS tablet Take 40 mg by mouth every evening.   Past Month at Unknown time  . meclizine (ANTIVERT) 25 MG tablet Take 25 mg by mouth 3 (three) times daily as needed for dizziness.   Past Month at Unknown time    Treatment Modalities: Medication Management, Group therapy, Case management,  1 to 1 session with clinician, Psychoeducation, Recreational therapy.   Physician Treatment Plan for Primary Diagnosis: Schizophrenia, catatonic type ([place]) Long Term Goal(s): Improvement in symptoms so as ready for discharge  Short Term Goals: Ability to identify changes in lifestyle to reduce recurrence of condition will improve Ability to verbalize feelings will improve Compliance with prescribed medications will improve  Medication Management: Evaluate patient's response, side effects, and tolerance of medication regimen.  Therapeutic Interventions: 1 to 1 sessions, Unit Group sessions and Medication administration.  Evaluation of Outcomes: Not Progressing  Physician Treatment Plan  for Secondary Diagnosis: Principal Problem:   Schizophrenia, catatonic type ([place])   Long Term Goal(s): Improvement in symptoms so as ready for discharge  Short Term Goals: Ability to identify changes in lifestyle to reduce recurrence of condition will improve Ability to verbalize feelings will improve Compliance with prescribed medications will improve   Medication Management: Evaluate patient's response, side effects, and tolerance of medication regimen.  Therapeutic Interventions: 1 to 1 sessions, Unit Group sessions and Medication administration.  Evaluation of Outcomes: Not Progressing   3/9:  For schizophrenia:   Will continue Latuda 40  mg po bid with meals.  For negative sx: Will continue to reduce ativan , make it prn. Will continue Effexor 50 mg po bid.  For insomnia: reviewed Trazodone to 150 mg po qhs.  3/14: For schizophrenia: Continue to crosstaper Latuda with Zyprexa. Will increase Zyprexa to 10 mg po qhs.   For negative sx: Will continue Effexor XR 150 mg po daily.   For insomnia: Increase Doxepin to 25 mg po qhs.   For appetite: Increase Megace to 400 mg po bid.   RN Treatment Plan for Primary Diagnosis: Schizophrenia, catatonic type ([place]) Long Term Goal(s): Knowledge of disease and therapeutic regimen to maintain health will improve  Short Term Goals: Ability to participate in decision making will improve and Compliance with prescribed medications will improve  Medication Management: RN will administer medications as ordered by provider, will assess and evaluate patient's response and provide education to patient for prescribed medication. RN will report any adverse and/or side effects to prescribing provider.  Therapeutic Interventions: 1 on 1 counseling sessions, Psychoeducation, Medication administration, Evaluate responses to treatment, Monitor vital signs and CBGs as ordered, Perform/monitor CIWA, COWS, AIMS and Fall Risk screenings as ordered, Perform wound care treatments as ordered.  Evaluation of Outcomes: Not Progressing   Recreational Therapy Treatment Plan for Primary Diagnosis: Schizophrenia, catatonic type ([place]) Long Term Goal(s): LTG- Patient will participate in recreation therapy tx in at least 2 group sessions without prompting from LRT.  Short Term Goals: Patient will demonstrate increased ability to follow instructions, as demonstrated by ability to follow LRT instructions on first prompt during recreation therapy group sessions.  Treatment Modalities: Group and Pet Therapy  Therapeutic Interventions: Psychoeducation  Evaluation of Outcomes: Not Progressing   LCSW  Treatment Plan for Primary Diagnosis: Schizophrenia, catatonic type ([place]) Long Term Goal(s): Safe transition to appropriate next level of care at discharge, Engage patient in therapeutic group addressing interpersonal concerns.  Short Term Goals: Engage patient in aftercare planning with referrals and resources  Therapeutic Interventions: Assess for all discharge needs, 1 to 1 time with Social worker, Explore available resources and support systems, Assess for adequacy in community support network, Educate family and significant other(s) on suicide prevention, Complete Psychosocial Assessment, Interpersonal group therapy.  Evaluation of Outcomes: Met  Return home, follow up [place] in Treatment: Attending groups: Yes Participating in groups: Yes Taking medication as prescribed: Yes Toleration medication: Yes, no side effects reported at this time Family/Significant other contact made: Yes Patient understands diagnosis:No  Limited insight Discussing patient identified problems/goals with staff: Yes Medical problems stabilized or resolved: Yes Denies suicidal/homicidal ideation: Yes Issues/concerns per patient self-inventory: None Other: N/A  New problem(s) identified: None identified at this time.   New Short Term/Long Term Goal(s): None identified at this time.   Discharge Plan or Barriers: 3/14:  Pt not d/ced last weekend as planned as pt became selectively mute on Saturday, and has continued  to be since that time  Reason for Continuation of Hospitalization: Depression Medication stabilization   Estimated Length of Stay: 3/5 days  Attendees: Patient: 07/27/2016  3:23 PM  Physician: Ursula Alert, MD 07/27/2016  3:23 PM  Nursing: Hoy Register, RN 07/27/2016  3:23 PM  RN Care Manager: Lars Pinks, RN 07/27/2016  3:23 PM  Social Worker: Ripley Fraise 07/27/2016  3:23 PM  Recreational Therapist: Laretta Bolster  07/27/2016  3:23 PM  Other: Norberto Sorenson 07/27/2016   3:23 PM  Other:  07/27/2016  3:23 PM    Scribe for Treatment Team:  Roque Lias LCSW 07/27/2016 3:23 PM

## 2016-07-27 NOTE — Progress Notes (Signed)
[facility] MD Progress Note  07/27/2016 11:41 AM Isabella [name]  MRN:  [id number]  Subjective:Pt states " I do not feel good."      Objective: Patient seen and chart reviewed.Discussed patient with treatment team.  Pt today seen as anxious , continues to states she is not feeling well. When asked about this , pt reported that she has back pain, anxiety sx as well as lack of appetite. Pt also reports sleep issues, but is improving. Pt is tolerating medications well, denies ADRs. Pt advised to attend groups , she has been doing it only with a lot of encouragement from staff. Will continue to observe.          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  Past Medical History:  Past Medical History:  Diagnosis Date  . Bipolar disorder ([place])   . Hypertension   . Migraine 07/13/2016  . Schizophrenia ([place])    History reviewed. No pertinent surgical history. Family History:  Family History  Problem Relation Age of Onset  . Mental illness Neg Hx    Family Psychiatric  History: Please see H&P.  Social History: Please see H&P.  History  Alcohol Use No     History  Drug Use No    Social History   Social History  . Marital status: Single    Spouse name: N/A  . Number of children: N/A  . Years of education: N/A   Social History Main Topics  . Smoking status: Smoker, Current Status Unknown  . Smokeless tobacco: Never Used  . Alcohol use No  . Drug use: No  . Sexual activity: No   Other Topics Concern  . None   Social History Narrative  . None   Additional Social History:    Pain Medications: See PTA Prescriptions: See PTA Over the Counter: See PTA History of alcohol / drug use?: No history of alcohol / drug abuse  Sleep:  improving  Appetite:  Poor  Current Medications: Current Facility-Administered Medications  Medication Dose Route Frequency Provider Last Rate Last Dose  . acetaminophen (TYLENOL) tablet 650 mg  650 mg Oral Q6H PRN Ethelene Hal, NP   650 mg at 07/19/16 1132  . alum & mag hydroxide-simeth (MAALOX/MYLANTA) 200-200-20 MG/5ML suspension 30 mL  30 mL Oral Q4H PRN Ethelene Hal, NP      . aspirin EC tablet 81 mg  81 mg Oral Daily Laverle Hobby, PA-C   81 mg at 07/27/16 2778  . doxepin (SINEQUAN) capsule 25 mg  25 mg Oral QHS Ursula Alert, MD   25 mg at 07/26/16 2144  . feeding supplement (ENSURE ENLIVE) (ENSURE ENLIVE) liquid 237 mL  237 mL Oral BID BM Keyle Doby, MD   237 mL at 07/26/16 1600  . hydrALAZINE (APRESOLINE) tablet 10 mg  10 mg Oral Q6H PRN Laverle Hobby, PA-C      . hydrOXYzine (ATARAX/VISTARIL) tablet 25 mg  25 mg Oral Q6H PRN Ethelene Hal, NP   25 mg at 07/17/16 2204  . lidocaine (LIDODERM) 5 % 1 patch  1 patch Transdermal Daily Ursula Alert, MD   1 patch at 07/27/16 0840  . LORazepam (ATIVAN) tablet 0.5 mg  0.5 mg Oral Daily PRN Ursula Alert, MD      .  losartan (COZAAR) tablet 25 mg  25 mg Oral Daily Laverle Hobby, PA-C   25 mg at 07/27/16 4098  . magnesium hydroxide (MILK OF MAGNESIA) suspension 30 mL  30 mL Oral Daily PRN Ethelene Hal, NP      . megestrol (MEGACE) 400 MG/10ML suspension 400 mg  400 mg Oral BID Ursula Alert, MD   400 mg at 07/27/16 0839  . OLANZapine (ZYPREXA) tablet 10 mg  10 mg Oral QHS Braelin Brosch, MD      . propranolol (INDERAL) tablet 10 mg  10 mg Oral BID Ursula Alert, MD   10 mg at 07/27/16 1191  . traZODone (DESYREL) tablet 50 mg  50 mg Oral QHS PRN Ursula Alert, MD   50 mg at 07/25/16 2039  . venlafaxine XR (EFFEXOR-XR) 24 hr capsule 150 mg  150 mg Oral Q breakfast Ursula Alert, MD   150 mg at 07/27/16 4782   Lab Results: No results found for this or any previous visit (from the past 48  hour(s)).  Blood Alcohol level:  Lab Results  Component Value Date   ETH <5 95/62/1308   Metabolic Disorder Labs: Lab Results  Component Value Date   HGBA1C 5.4 01/31/2016   MPG 108 01/31/2016   Lab Results  Component Value Date   PROLACTIN 124.2 (H) 01/31/2016   Lab Results  Component Value Date   CHOL 152 01/31/2016   TRIG 147 01/31/2016   HDL 39 (L) 01/31/2016   CHOLHDL 3.9 01/31/2016   VLDL 29 01/31/2016   LDLCALC 84 01/31/2016   Physical Findings: AIMS: Facial and Oral Movements Muscles of Facial Expression: None, normal Lips and Perioral Area: None, normal Jaw: None, normal Tongue: None, normal,Extremity Movements Upper (arms, wrists, hands, fingers): None, normal Lower (legs, knees, ankles, toes): None, normal, Trunk Movements Neck, shoulders, hips: None, normal, Overall Severity Severity of abnormal movements (highest score from questions above): None, normal Incapacitation due to abnormal movements: None, normal Patient's awareness of abnormal movements (rate only patient's report): No Awareness, Dental Status Current problems with teeth and/or dentures?: No Does patient usually wear dentures?: No  CIWA:  CIWA-Ar Total: 1 COWS:  COWS Total Score: 1  Musculoskeletal: Strength & Muscle Tone: within normal limits Gait & Station: normal Patient leans: N/A  Psychiatric Specialty Exam: Physical Exam  Nursing note and vitals reviewed.   Review of Systems  Musculoskeletal: Positive for back pain.  Psychiatric/Behavioral: Positive for depression. The patient is nervous/anxious.   All other systems reviewed and are negative.   Blood pressure 100/64, pulse 82, temperature 99 F (37.2 C), temperature source Oral, resp. rate 16.There is no height or weight on file to calculate BMI.  General Appearance: Fairly Groomed  Eye Contact:  Fair  Speech:  Normal Rate  Volume:  Decreased  Mood:  Anxious, Depressed and Dysphoric  Affect:  Congruent  Thought Process:  linear  Orientation:  Other:  self, year, situation  Thought Content:  Paranoid Ideation and Rumination guarded  Suicidal Thoughts:  No  Homicidal Thoughts:  No  Memory:  Immediate;   Fair Recent;   Poor Remote;   Poor  Judgement:  Fair  Insight:  Shallow  Psychomotor Activity:  Decreased  Concentration:  Concentration: Fair and Attention Span: Fair  Recall:  [employer] of Knowledge:  Poor  Language:  Fair  Akathisia:  No  Handed:  Right  AIMS (if indicated):     Assets:  Physical Health Social Support  ADL's:  Intact  Cognition: wnl  Sleep:  Number of Hours: 6.75   Schizophrenia, 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         Treatment Plan Summary:Pt today seen as anxious , depressed, reports depression and anxiety is better, however is seen in bed , isolative often , is seen as having loss of appetite , needs encouragement to attend groups , take her meals and so on. Will continue medication management.      Daily contact with patient to assess and evaluate symptoms and progress in treatment, Medication management and Plan see below    For schizophrenia: Continue to crosstaper Latuda with Zyprexa. Will increase Zyprexa to 10 mg po qhs.   For negative sx: Will continue Effexor XR 150 mg po daily.   For insomnia: Increase Doxepin to 25 mg po qhs.   For appetite: Increase Megace to 400 mg po bid.   Labs reviewed- lipid panel - wnl , Pl - elevated - will monitor closely.  EKG reviewed - nonspecific T wave abnormality, tachycardia.  Patient encouraged to attend groups , participate in milieu.  CSW will continue to work on disposition.   Baxter Gonzalez, MD 07/27/2016, 11:41 AMPatient ID: Isabella [name], female   DOB: 1976-05-10, 41 y.o.   MRN: [id number]

## 2016-07-28 MED ORDER — VENLAFAXINE HCL ER 75 MG PO CP24
225.0000 mg | ORAL_CAPSULE | Freq: Every day | ORAL | Status: DC
  Administered 2016-07-29 – 2016-08-02 (×5): 225 mg via ORAL
  Filled 2016-07-28: qty 1
  Filled 2016-07-28: qty 2
  Filled 2016-07-28 (×2): qty 1
  Filled 2016-07-28 (×2): qty 2
  Filled 2016-07-28 (×4): qty 1

## 2016-07-28 NOTE — Progress Notes (Signed)
Recreation Therapy Notes  Date: 07/28/16 Time: 0950 Location: 500 [address] Dayroom  Group Topic: Anger Management  Goal Area(s) Addresses:  Patient will identify triggers for anger.  Patient will identify physical reaction to anger.   Patient will identify benefit of using coping skills when angry.  Behavioral Response: None  Intervention: Anger thermometer worksheet, pencils  Activity: Anger Thermometer.  Patients were to think of 5 instances were they became anger and rank them (1-10) on the thermometer.  Patients were to give a brief description of the incidences, their reaction to it, how they felt and the consequences.  Patients were to also identify at least 5 coping skills they could use to combat anger.     Education: Anger Management, Discharge Planning   Education Outcome: Acknowledges education/In group clarification offered/Needs additional education.   Clinical Observations/Feedback: Pt sat quietly and did not participate even with prompting.   Victorino Sparrow, LRT/CTRS     Victorino Sparrow A 07/28/2016 11:24 AM

## 2016-07-28 NOTE — Progress Notes (Signed)
Patient has been noted in the day room engaging minimally.  Patient denies SI, Hi and AVH.  Patient has been noted to be wringing her hands until they began to chaff.  Patient is compliant with medications and has attended groups.   Assess patient for safety, offer medications as prescribed, engage patient in 1:1 staff talks.   Continue to monitor as planned.

## 2016-07-28 NOTE — [facility] Group Notes (Signed)
Type of Therapy:  Group Therapy   Participation Level:  Engaged  Participation Quality:  Attentive  Affect:  Appropriate   Cognitive:  Alert   Insight:  Engaged  Engagement in Therapy:  Improving   Mode s of Intervention:  Education, Exploration, Socialization   Summary of Progress/Problems: Engaged throughout, stayed entire time.   Tammi from the [place] was here to tell her story of recovery and inform patients about [facility] and their services.

## 2016-07-28 NOTE — Plan of Care (Signed)
Problem: Activity: Goal: Interest or engagement in activities will improve Outcome: Progressing Pt. is making effort to sit out in dayroom more provided with encouragement from staff.

## 2016-07-28 NOTE — Progress Notes (Signed)
DATA ACTION RESPONSE  Objective- Pt. is up and visible in the milieu, sitting quietly to herself while messing with her hands consistently and constantly.Pt. presents with a depressed affect and mood. Pt. remains minimal & guarded with interaction but is slowly opening up today to Probation officer and interpreter. Pt. minimizes s/s. Requires encouragement but is receptive.  Subjective- Denies having any SI/HI/AVH/Pain at this time. Pt. states " I'm good". Pt. continues to be cooperative and remain safe on the unit.  1:1 interaction in private to establish rapport. Encouragement, education, & support given from staff. Meds. ordered and administered.    Safety maintained with Q 15 checks. Continues to follow treatment plan and will monitor closely. No additonal questions/concerns noted.

## 2016-07-28 NOTE — Progress Notes (Signed)
Adult Psychoeducational Group Note  Date:  07/28/2016 Time:  9:17 PM  Group Topic/Focus:  Wrap-Up Group:   The focus of this group is to help patients review their daily goal of treatment and discuss progress on daily workbooks.  Participation Level:  Minimal  Participation Quality:  Appropriate  Affect:  Flat  Cognitive:  Appropriate  Insight: Limited  Engagement in Group:  Lacking  Modes of Intervention:  Discussion  Additional Comments:  Pt rated her day 3/10.   [name] R 07/28/2016, 9:17 PM

## 2016-07-28 NOTE — Progress Notes (Signed)
[facility] MD Progress Note  07/28/2016 3:30 PM Isabella [name]  MRN:  [id number]  Subjective:Pt states, "Im doing ok." Objective: Patient seen and chart reviewed.Discussed patient with treatment team.  Pt today seen in day room.  Quiet demeanor.  At times, she states into space.  Per nursing, non disruptive to milieu.  Compliant with medications.  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  Past Medical History:  Past Medical History:  Diagnosis Date  . Bipolar disorder ([place])   . Hypertension   . Migraine 07/13/2016  . Schizophrenia ([place])    History reviewed. No pertinent surgical history. Family History:  Family History  Problem Relation Age of Onset  . Mental illness Neg Hx    Family Psychiatric  History: Please see H&P.  Social History: Please see H&P.  History  Alcohol Use No     History  Drug Use No    Social History   Social History  . Marital status: Single    Spouse name: N/A  . Number of children: N/A  . Years of education: N/A   Social History Main Topics  . Smoking status: Smoker, Current Status Unknown  . Smokeless tobacco: Never Used  . Alcohol use No  . Drug use: No  . Sexual activity: No   Other Topics Concern  . None   Social History Narrative  . None   Additional Social History:    Pain Medications: See PTA Prescriptions: See PTA Over the Counter: See PTA History of alcohol / drug use?: No history of alcohol / drug abuse  Sleep: improving  Appetite:  Poor  Current Medications: Current Facility-Administered Medications  Medication Dose Route Frequency Provider Last Rate Last Dose  . acetaminophen (TYLENOL) tablet 650 mg  650 mg Oral Q6H PRN Ethelene Hal, NP   650 mg at 07/19/16 1132  . alum & mag  hydroxide-simeth (MAALOX/MYLANTA) 200-200-20 MG/5ML suspension 30 mL  30 mL Oral Q4H PRN Ethelene Hal, NP      . aspirin EC tablet 81 mg  81 mg Oral Daily Laverle Hobby, PA-C   81 mg at 07/28/16 6503  . doxepin (SINEQUAN) capsule 25 mg  25 mg Oral QHS Ursula Alert, MD   25 mg at 07/27/16 2125  . feeding supplement (ENSURE ENLIVE) (ENSURE ENLIVE) liquid 237 mL  237 mL Oral BID BM Saramma Eappen, MD   237 mL at 07/28/16 1118  . hydrALAZINE (APRESOLINE) tablet 10 mg  10 mg Oral Q6H PRN Laverle Hobby, PA-C      . hydrOXYzine (ATARAX/VISTARIL) tablet 25 mg  25 mg Oral Q6H PRN Ethelene Hal, NP   25 mg at 07/17/16 2204  . lidocaine (LIDODERM) 5 % 1 patch  1 patch Transdermal Daily Ursula Alert, MD   1 patch at 07/28/16 0838  . LORazepam (ATIVAN) tablet 0.5 mg  0.5 mg Oral Daily PRN Ursula Alert, MD      . losartan (COZAAR) tablet 25 mg  25 mg Oral Daily Laverle Hobby, PA-C   25 mg at 07/28/16 5465  . magnesium hydroxide (MILK OF MAGNESIA) suspension 30 mL  30 mL Oral Daily PRN Ethelene Hal, NP      . megestrol (MEGACE) 400 MG/10ML suspension 400 mg  400 mg Oral BID Saramma  Eappen, MD   400 mg at 07/28/16 0838  . OLANZapine (ZYPREXA) tablet 10 mg  10 mg Oral QHS Ursula Alert, MD   10 mg at 07/27/16 2125  . propranolol (INDERAL) tablet 10 mg  10 mg Oral BID Ursula Alert, MD   10 mg at 07/27/16 1715  . traZODone (DESYREL) tablet 50 mg  50 mg Oral QHS PRN Ursula Alert, MD   50 mg at 07/25/16 2039  . venlafaxine XR (EFFEXOR-XR) 24 hr capsule 150 mg  150 mg Oral Q breakfast Ursula Alert, MD   150 mg at 07/28/16 3235   Lab Results: No results found for this or any previous visit (from the past 48 hour(s)).  Blood Alcohol level:  Lab Results  Component Value Date   ETH <5 57/32/2025   Metabolic Disorder Labs: Lab Results  Component Value Date   HGBA1C 5.4 01/31/2016   MPG 108 01/31/2016   Lab Results  Component Value Date   PROLACTIN 124.2 (H) 01/31/2016    Lab Results  Component Value Date   CHOL 152 01/31/2016   TRIG 147 01/31/2016   HDL 39 (L) 01/31/2016   CHOLHDL 3.9 01/31/2016   VLDL 29 01/31/2016   LDLCALC 84 01/31/2016   Physical Findings: AIMS: Facial and Oral Movements Muscles of Facial Expression: None, normal Lips and Perioral Area: None, normal Jaw: None, normal Tongue: None, normal,Extremity Movements Upper (arms, wrists, hands, fingers): None, normal Lower (legs, knees, ankles, toes): None, normal, Trunk Movements Neck, shoulders, hips: None, normal, Overall Severity Severity of abnormal movements (highest score from questions above): None, normal Incapacitation due to abnormal movements: None, normal Patient's awareness of abnormal movements (rate only patient's report): No Awareness, Dental Status Current problems with teeth and/or dentures?: No Does patient usually wear dentures?: No  CIWA:  CIWA-Ar Total: 1 COWS:  COWS Total Score: 1  Musculoskeletal: Strength & Muscle Tone: within normal limits Gait & Station: normal Patient leans: N/A  Psychiatric Specialty Exam: Physical Exam  Nursing note and vitals reviewed.   Review of Systems  Musculoskeletal: Positive for back pain.  Psychiatric/Behavioral: Positive for depression. The patient is nervous/anxious.   All other systems reviewed and are negative.   Blood pressure 96/75, pulse (!) 104, temperature 98.2 F (36.8 C), temperature source Oral, resp. rate 16.There is no height or weight on file to calculate BMI.  General Appearance: Fairly Groomed  Eye Contact:  Fair  Speech:  Normal Rate  Volume:  Decreased  Mood:  Anxious, Depressed and Dysphoric  Affect:  Congruent  Thought Process: linear  Orientation:  Other:  self, year, situation  Thought Content:  Paranoid Ideation and Rumination guarded  Suicidal Thoughts:  No  Homicidal Thoughts:  No  Memory:  Immediate;   Fair Recent;   Poor Remote;   Poor  Judgement:  Fair  Insight:  Shallow   Psychomotor Activity:  Decreased  Concentration:  Concentration: Fair and Attention Span: Fair  Recall:  [employer] of Knowledge:  Poor  Language:  Fair  Akathisia:  No  Handed:  Right  AIMS (if indicated):     Assets:  Physical Health Social Support  ADL's:  Intact  Cognition: wnl  Sleep:  Number of Hours: 6.5   Schizophrenia, 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  Treatment Plan Summary:  Pt today seen and continues to be flat affect, reports depression and anxiety, isolative often, continues to need encouragement to attend  groups and eat meals in the cafeteria Will continue medication management.   Daily contact with patient to assess and evaluate symptoms and progress in treatment, Medication management and Plan see below    For schizophrenia: Continue to crosstaper Latuda with Zyprexa.  Cont. Zyprexa to 10 mg po qhs.   For negative sx: Will increase Effexor XR to 225 mg po daily.   For insomnia: Increase Doxepin to 25 mg po qhs.   For appetite: Increase Megace to 400 mg po bid.   Labs reviewed- lipid panel - wnl , Pl - elevated - will monitor closely.  EKG reviewed - nonspecific T wave abnormality, tachycardia.  Patient encouraged to attend groups , participate in milieu.  CSW will continue to work on disposition.   Janett Labella, NP [facility] 07/28/2016, 3:30 PM

## 2016-07-29 MED ORDER — CLONAZEPAM 0.5 MG PO TABS
0.2500 mg | ORAL_TABLET | Freq: Two times a day (BID) | ORAL | Status: DC
  Administered 2016-07-29 – 2016-08-02 (×8): 0.25 mg via ORAL
  Filled 2016-07-29 (×9): qty 1

## 2016-07-29 MED ORDER — HYDROCERIN EX CREA
TOPICAL_CREAM | Freq: Two times a day (BID) | CUTANEOUS | Status: DC
  Administered 2016-07-29 – 2016-07-31 (×5): via TOPICAL
  Administered 2016-08-01 (×2): 1 via TOPICAL
  Administered 2016-08-02: 08:00:00 via TOPICAL
  Filled 2016-07-29: qty 113

## 2016-07-29 MED ORDER — BENZTROPINE MESYLATE 0.5 MG PO TABS
0.5000 mg | ORAL_TABLET | Freq: Every day | ORAL | Status: DC
  Administered 2016-07-29 – 2016-08-01 (×4): 0.5 mg via ORAL
  Filled 2016-07-29 (×6): qty 1

## 2016-07-29 NOTE — Progress Notes (Signed)
Interpreter present during assessment. Was helpful.

## 2016-07-29 NOTE — Progress Notes (Signed)
Recreation Therapy Notes  Date: 07/29/16 Time: 1000 Location: 500 [address] Dayroom   Group Topic: Communication, Team Building, Problem Solving  Goal Area(s) Addresses:  Patient will effectively work with peer towards shared goal.  Patient will identify skill used to make activity successful.  Patient will identify how skills used during activity can be used to reach post d/c goals.   Intervention: STEM Activity   Activity: [employer]. Patients were provided the following materials: 5 drinking straws, 5 rubber bands, 5 paper clips, 2 index cards, 2 drinking cups, and 2 toilet paper rolls. Using the provided materials patients were asked to build a launching mechanisms to launch a ping pong ball approximately 12 feet. Patients were divided into teams of 3-5.   Education: Education officer, community, Dentist.   Education Outcome: Acknowledges education/In group clarification offered/Needs additional education.   Clinical Observations/Feedback: Pt did not attend group.   Victorino Sparrow, LRT/CTRS     Victorino Sparrow A 07/29/2016 12:03 PM

## 2016-07-29 NOTE — [facility] Group Notes (Signed)
Adult Psychoeducational Group Note  Date:  07/29/2016 Time:  8:00 PM   Group Topic/Focus:  Wrap-Up Group:   The focus of this group is to help patients review their daily goal of treatment and discuss progress on daily workbooks.  Participation Level:  Minimal  Participation Quality:  Attentive  Affect:  Flat  Cognitive:  Appropriate  Insight: Limited  Engagement in Group:  Limited  Modes of Intervention:  Discussion and Education  Additional Comments:  Patient reported no having a goal for the day.  When asked Patient reported feeling better than she felt on the previous day.    Leroy Sea N 07/29/2016, 9:40 PM

## 2016-07-29 NOTE — Progress Notes (Signed)
[facility] MD Progress Note  07/29/2016 2:23 PM Isabella [name]  MRN:  [id number]  Subjective:Pt states " I am a bit anxious."  Objective: Patient seen and chart reviewed.Discussed patient with treatment team.  Pt seen today in the dayroom. Pt seen as dressed in casual clothes , had a shower this AM and appeared to be fairly groomed. Pt kept stating" I do not know " to questions asked. However on prompting her more, she was able to talk about her home , that she cooks at home and what she usually cooks - rice and beans and may be pork, and so on. Pt the entire evaluation time was seen as wringing her hands , when asked, reported she was anxious. Per RN , she has been doing this a lot and her skin has been chafing. Discussed using coping techniques , relaxation techniques, coloring and so on. Pt agrees. Continue to make medication changes.   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  Past Medical History:  Past Medical History:  Diagnosis Date  . Bipolar disorder ([place])   . Hypertension   . Migraine 07/13/2016  . Schizophrenia ([place])    History reviewed. No pertinent surgical history. Family History:  Family History  Problem Relation Age of Onset  . Mental illness Neg Hx    Family Psychiatric  History: Please see H&P.  Social History: Please see H&P.  History  Alcohol Use No     History  Drug Use No    Social History   Social History  . Marital status: Single    Spouse name: N/A  . Number of children: N/A  . Years of education: N/A   Social History Main Topics  . Smoking status: Smoker, Current Status Unknown  . Smokeless tobacco: Never Used  . Alcohol use No  . Drug use: No  . Sexual activity: No   Other Topics Concern  . None   Social  History Narrative  . None   Additional Social History:    Pain Medications: See PTA Prescriptions: See PTA Over the Counter: See PTA History of alcohol / drug use?: No history of alcohol / drug abuse  Sleep: Fair  Appetite:  Poor improving  Current Medications: Current Facility-Administered Medications  Medication Dose Route Frequency Provider Last Rate Last Dose  . acetaminophen (TYLENOL) tablet 650 mg  650 mg Oral Q6H PRN Ethelene Hal, NP   650 mg at 07/19/16 1132  . alum & mag hydroxide-simeth (MAALOX/MYLANTA) 200-200-20 MG/5ML suspension 30 mL  30 mL Oral Q4H PRN Ethelene Hal, NP      . aspirin EC tablet 81 mg  81 mg Oral Daily Laverle Hobby, PA-C   81 mg at 07/29/16 1206  . benztropine (COGENTIN) tablet 0.5 mg  0.5 mg Oral QHS Sachi Boulay, MD      . clonazePAM (KLONOPIN) tablet 0.25 mg  0.25 mg Oral BID Ursula Alert, MD   0.25 mg at 07/29/16 1206  . doxepin (SINEQUAN) capsule 25 mg  25 mg Oral QHS Ursula Alert, MD   25 mg at 07/28/16 2114  . feeding supplement (ENSURE ENLIVE) (ENSURE ENLIVE) liquid 237 mL  237 mL Oral BID BM Paraskevi Funez, MD   237 mL at 07/28/16 1118  . hydrALAZINE (APRESOLINE) tablet 10 mg  10 mg Oral Q6H PRN Laverle Hobby, PA-C      . hydrocerin (EUCERIN) cream   Topical BID Ursula Alert, MD      . hydrOXYzine (ATARAX/VISTARIL) tablet 25 mg  25 mg Oral Q6H PRN Ethelene Hal, NP   25 mg at 07/17/16 2204  . lidocaine (LIDODERM) 5 % 1 patch  1 patch Transdermal Daily Ursula Alert, MD   1 patch at 07/28/16 0838  . losartan (COZAAR) tablet 25 mg  25 mg Oral Daily Laverle Hobby, PA-C   25 mg at 07/29/16 1207  . magnesium hydroxide (MILK OF MAGNESIA) suspension 30 mL  30 mL Oral Daily PRN Ethelene Hal, NP      . megestrol (MEGACE) 400 MG/10ML suspension 400 mg  400 mg Oral BID Ursula Alert, MD   400 mg at 07/29/16 1207  . OLANZapine (ZYPREXA) tablet 10 mg  10 mg Oral QHS Ursula Alert, MD   10 mg at 07/28/16 2114  .  propranolol (INDERAL) tablet 10 mg  10 mg Oral BID Ursula Alert, MD   10 mg at 07/29/16 1207  . traZODone (DESYREL) tablet 50 mg  50 mg Oral QHS PRN Ursula Alert, MD   50 mg at 07/25/16 2039  . venlafaxine XR (EFFEXOR-XR) 24 hr capsule 225 mg  225 mg Oral Q breakfast Kerrie Buffalo, NP   225 mg at 07/29/16 1206   Lab Results: No results found for this or any previous visit (from the past 48 hour(s)).  Blood Alcohol level:  Lab Results  Component Value Date   ETH <5 02/77/4128   Metabolic Disorder Labs: Lab Results  Component Value Date   HGBA1C 5.4 01/31/2016   MPG 108 01/31/2016   Lab Results  Component Value Date   PROLACTIN 124.2 (H) 01/31/2016   Lab Results  Component Value Date   CHOL 152 01/31/2016   TRIG 147 01/31/2016   HDL 39 (L) 01/31/2016   CHOLHDL 3.9 01/31/2016   VLDL 29 01/31/2016   LDLCALC 84 01/31/2016   Physical Findings: AIMS: Facial and Oral Movements Muscles of Facial Expression: None, normal Lips and Perioral Area: None, normal Jaw: None, normal Tongue: None, normal,Extremity Movements Upper (arms, wrists, hands, fingers): None, normal Lower (legs, knees, ankles, toes): None, normal, Trunk Movements Neck, shoulders, hips: None, normal, Overall Severity Severity of abnormal movements (highest score from questions above): None, normal Incapacitation due to abnormal movements: None, normal Patient's awareness of abnormal movements (rate only patient's report): No Awareness, Dental Status Current problems with teeth and/or dentures?: No Does patient usually wear dentures?: No  CIWA:  CIWA-Ar Total: 1 COWS:  COWS Total Score: 1  Musculoskeletal: Strength & Muscle Tone: within normal limits Gait & Station: normal Patient leans: N/A  Psychiatric Specialty Exam: Physical Exam  Nursing note and vitals reviewed.   Review of Systems  Psychiatric/Behavioral: Positive for depression. The patient is nervous/anxious.   All other systems reviewed and  are negative.   Blood pressure 110/72, pulse (!) 117, temperature 98.2 F (36.8 C), temperature source Oral, resp. rate 16.There is no height or weight on file to calculate BMI.  General Appearance: Fairly Groomed  Eye Contact:  Fair  Speech:  Normal Rate  Volume:  Decreased  Mood:  Anxious, Depressed and Dysphoric, improving , anxiety is still high, depression is improving  Affect:  Congruent  Thought Process: linear  Orientation:  Full (Time, Place, and Person)  Thought Content:  Paranoid Ideation and Rumination   Suicidal Thoughts:  No  Homicidal Thoughts:  No  Memory:  Immediate;   Fair Recent;   Fair Remote;   Poor  Judgement:  Fair  Insight:  Shallow  Psychomotor Activity:  Decreased  Concentration:  Concentration: Fair and Attention Span: Fair  Recall:  [employer] of Knowledge:  Fair  Language:  Fair  Akathisia:  No  Handed:  Right  AIMS (if indicated):     Assets:  Physical Health Social Support  ADL's:  Intact  Cognition: wnl  Sleep:  Number of Hours: 6.75   Schizophrenia, 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    Treatment Plan Summary: Pt today seen as anxious, fidgety , her depression is improving , she is more visible in milieu , although continues to need a lot of prompting to take care of ADLs as well as to participate . Will continue to readjust medications , continue treatment.     Daily contact with patient to assess and evaluate symptoms and progress in treatment, Medication management and Plan see below    For schizophrenia: Will continue Zyprexa 10 mg po qhs. Will add Cogentin 0.5 mg po qhs for EPS.  For anxiety sx: Will start Klonopin 0.25 mg po bid.   For negative sx:  Effexor XR to 225 mg po daily.   For insomnia: Doxepin to 25 mg po qhs.   For appetite: Megace to 400 mg po bid.   Labs reviewed- lipid panel - wnl , Pl - elevated - will monitor closely.  EKG  reviewed - nonspecific T wave abnormality, tachycardia.  Patient encouraged to attend groups , participate in milieu.  CSW will continue to work on disposition.   Jaela Yepez, MD  07/29/2016, 2:23 PM

## 2016-07-29 NOTE — Plan of Care (Signed)
Problem: Medication: Goal: Compliance with prescribed medication regimen will improve Outcome: Progressing Pt is taking meds as prescribed.    

## 2016-07-29 NOTE — Progress Notes (Signed)
  DATA ACTION RESPONSE  Objective-Pt. is up and visible in the milieu, sitting quietly to herself. Hands were still. Pt. presents with a withdrawnaffect and mood. Pt. remains minimal &guarded with interaction but is  opening up more with Probation officer. Pt. minimizes s/s. Requires encouragement but is receptive.  Subjective-Denies having any SI/HI/AVH/Pain at this time. Pt. states "I'm fine". Pt. continues to be cooperative and remain safe on the unit.  1:1 interaction in private to establish rapport. Encouragement, education, &support given from staff. Meds. ordered and administered.  BP/HR WNL.  Safety maintained with Q 15 checks. Continues to follow treatment plan and will monitor closely. No additonal questions/concerns noted.

## 2016-07-29 NOTE — [facility] Group Notes (Signed)
[facility] LCSW Group Therapy  07/29/2016  1:05 PM  Type of Therapy:  Group therapy  Participation Level:  Active  Participation Quality:  Attentive  Affect:  Flat  Cognitive:  Oriented  Insight:  Limited  Engagement in Therapy:  Limited  Modes of Intervention:  Discussion, Socialization  Summary of Progress/Problems:  Chaplain was here to lead a group on themes of hope and courage. Stayed fhe entire time.  Responded with yeas and no.  Pointed at another patient when asked if there was anyone with whom she related.  Minimal hand wringing. Roque Lias B 07/29/2016 1:27 PM

## 2016-07-30 NOTE — Progress Notes (Signed)
DAR NOTE: Patient presents with flat affect and depressed mood.  Denies auditory and visual hallucinations.  Described energy level as low and concentration as poor.  Rates depression at 3, hopelessness at 10, and anxiety at 3.  Maintained on routine safety checks.  Medications given as prescribed.  Support and encouragement offered as needed.  Attended group and was attentive. Minimal interaction with staff.  Patient is visible in milieu most of the shift.  Routine safety checks maintained.  Patient is safe on the unit.

## 2016-07-30 NOTE — Progress Notes (Signed)
[facility] MD Progress Note  07/30/2016 2:48 PM Isabella [name]  MRN:  [id number]  Subjective:  Patient in dayroom, smiles at this NP, denied any complaints. Objective: Patient seen and chart reviewed. Discussed patient with treatment team.  Pt seen today in the dayroom, dressed and fairly groomed. Per spanish interpreter, patient looking forward to gettiing home.   Forwards little and appears to be withdrawn at times.    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  Past Medical History:  Past Medical History:  Diagnosis Date  . Bipolar disorder ([place])   . Hypertension   . Migraine 07/13/2016  . Schizophrenia ([place])    History reviewed. No pertinent surgical history. Family History:  Family History  Problem Relation Age of Onset  . Mental illness Neg Hx    Family Psychiatric  History: Please see H&P.  Social History: Please see H&P.  History  Alcohol Use No     History  Drug Use No    Social History   Social History  . Marital status: Single    Spouse name: N/A  . Number of children: N/A  . Years of education: N/A   Social History Main Topics  . Smoking status: Smoker, Current Status Unknown  . Smokeless tobacco: Never Used  . Alcohol use No  . Drug use: No  . Sexual activity: No   Other Topics Concern  . None   Social History Narrative  . None   Additional Social History:    Pain Medications: See PTA Prescriptions: See PTA Over the Counter: See PTA History of alcohol / drug use?: No history of alcohol / drug abuse  Sleep: Fair  Appetite:  Poor improving  Current Medications: Current Facility-Administered Medications  Medication Dose Route Frequency Provider Last Rate Last Dose  . acetaminophen (TYLENOL) tablet 650 mg  650 mg Oral  Q6H PRN Ethelene Hal, NP   650 mg at 07/19/16 1132  . alum & mag hydroxide-simeth (MAALOX/MYLANTA) 200-200-20 MG/5ML suspension 30 mL  30 mL Oral Q4H PRN Ethelene Hal, NP      . aspirin EC tablet 81 mg  81 mg Oral Daily Laverle Hobby, PA-C   81 mg at 07/30/16 0826  . benztropine (COGENTIN) tablet 0.5 mg  0.5 mg Oral QHS Ursula Alert, MD   0.5 mg at 07/29/16 2110  . clonazePAM (KLONOPIN) tablet 0.25 mg  0.25 mg Oral BID Ursula Alert, MD   0.25 mg at 07/30/16 0827  . doxepin (SINEQUAN) capsule 25 mg  25 mg Oral QHS Ursula Alert, MD   25 mg at 07/29/16 2110  . feeding supplement (ENSURE ENLIVE) (ENSURE ENLIVE) liquid 237 mL  237 mL Oral BID BM Saramma Eappen, MD   237 mL at 07/30/16 0928  . hydrALAZINE (APRESOLINE) tablet 10 mg  10 mg Oral Q6H PRN Laverle Hobby, PA-C      . hydrocerin (EUCERIN) cream   Topical BID Ursula Alert, MD      . hydrOXYzine (ATARAX/VISTARIL) tablet 25 mg  25 mg Oral Q6H PRN Ethelene Hal, NP   25 mg at 07/17/16 2204  . lidocaine (LIDODERM) 5 % 1 patch  1 patch Transdermal Daily Ursula Alert, MD   1 patch at 07/30/16 0826  . losartan (COZAAR) tablet 25 mg  25 mg Oral Daily Laverle Hobby, PA-C   25 mg at 07/30/16 0800  . magnesium hydroxide (MILK OF MAGNESIA) suspension 30 mL  30 mL Oral Daily PRN Ethelene Hal, NP      . megestrol (MEGACE) 400 MG/10ML suspension 400 mg  400 mg Oral BID Ursula Alert, MD   400 mg at 07/30/16 0846  . OLANZapine (ZYPREXA) tablet 10 mg  10 mg Oral QHS Ursula Alert, MD   10 mg at 07/29/16 2110  . propranolol (INDERAL) tablet 10 mg  10 mg Oral BID Ursula Alert, MD   10 mg at 07/30/16 0827  . traZODone (DESYREL) tablet 50 mg  50 mg Oral QHS PRN Ursula Alert, MD   50 mg at 07/25/16 2039  . venlafaxine XR (EFFEXOR-XR) 24 hr capsule 225 mg  225 mg Oral Q breakfast Kerrie Buffalo, NP   225 mg at 07/30/16 7371   Lab Results: No results found for this or any previous visit (from the past 48  hour(s)).  Blood Alcohol level:  Lab Results  Component Value Date   ETH <5 11/09/9483   Metabolic Disorder Labs: Lab Results  Component Value Date   HGBA1C 5.4 01/31/2016   MPG 108 01/31/2016   Lab Results  Component Value Date   PROLACTIN 124.2 (H) 01/31/2016   Lab Results  Component Value Date   CHOL 152 01/31/2016   TRIG 147 01/31/2016   HDL 39 (L) 01/31/2016   CHOLHDL 3.9 01/31/2016   VLDL 29 01/31/2016   LDLCALC 84 01/31/2016   Physical Findings: AIMS: Facial and Oral Movements Muscles of Facial Expression: None, normal Lips and Perioral Area: None, normal Jaw: None, normal Tongue: None, normal,Extremity Movements Upper (arms, wrists, hands, fingers): Minimal Lower (legs, knees, ankles, toes): None, normal, Trunk Movements Neck, shoulders, hips: None, normal, Overall Severity Severity of abnormal movements (highest score from questions above): None, normal Incapacitation due to abnormal movements: None, normal Patient's awareness of abnormal movements (rate only patient's report): No Awareness, Dental Status Current problems with teeth and/or dentures?: No Does patient usually wear dentures?: No  CIWA:  CIWA-Ar Total: 1 COWS:  COWS Total Score: 1  Musculoskeletal: Strength & Muscle Tone: within normal limits Gait & Station: normal Patient leans: N/A  Psychiatric Specialty Exam: Physical Exam  Nursing note and vitals reviewed.   Review of Systems  Psychiatric/Behavioral: Positive for depression. The patient is nervous/anxious.   All other systems reviewed and are negative.   Blood pressure 120/68, pulse 96, temperature 98.2 F (36.8 C), resp. rate 18.There is no height or weight on file to calculate BMI.  General Appearance: Fairly Groomed  Eye Contact:  Fair  Speech:  Normal Rate  Volume:  Decreased  Mood:  Anxious, Depressed and Dysphoric, improving on all fronts  Affect:  Congruent  Thought Process: linear  Orientation:  Full (Time, Place, and  Person)  Thought Content:  Paranoid Ideation and Rumination   Suicidal Thoughts:  No  Homicidal Thoughts:  No  Memory:  Immediate;   Fair Recent;   Fair Remote;   Poor  Judgement:  Fair  Insight:  Shallow  Psychomotor Activity:  Decreased  Concentration:  Concentration: Fair and Attention Span: Fair  Recall:  [employer] of Knowledge:  Fair  Language:  Fair  Akathisia:  No  Handed:  Right  AIMS (if indicated):     Assets:  Physical Health Social Support  ADL's:  Intact  Cognition: wnl  Sleep:  Number of Hours: 6.75  Schizophrenia, 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  Treatment Plan Summary: Pt today seen as anxious, fidgety , her depression is improving , she is more visible in milieu , although continues to need a lot of prompting to take care of ADLs as well as to participate . Will continue to readjust medications , continue treatment.     Daily contact with patient to assess and evaluate symptoms and progress in treatment, Medication management and Plan see below    For schizophrenia: Will continue Zyprexa 10 mg po qhs. Will add Cogentin 0.5 mg po qhs for EPS.  For anxiety sx: Will start Klonopin 0.25 mg po bid.   For negative sx:  Effexor XR to 225 mg po daily.   For insomnia: Doxepin to 25 mg po qhs.   For appetite: Megace to 400 mg po bid.   Labs reviewed- lipid panel - wnl , Pl - elevated - will monitor closely.  EKG reviewed - nonspecific T wave abnormality, tachycardia.  Patient encouraged to attend groups , participate in milieu.  CSW will continue to work on disposition.   Janett Labella, NP [facility] 07/30/2016, 2:48 PM

## 2016-07-30 NOTE — Progress Notes (Signed)
Adult Psychoeducational Group Note  Date:  07/30/2016 Time:  8:50 PM  Group Topic/Focus:  Wrap-Up Group:   The focus of this group is to help patients review their daily goal of treatment and discuss progress on daily workbooks.  Participation Level:  Active  Participation Quality:  Appropriate  Affect:  Appropriate  Cognitive:  Appropriate  Insight: Appropriate  Engagement in Group:  Engaged  Modes of Intervention:  Discussion  Additional Comments:  The patient expressed that she did not have a good day.The also said that she attended groups.  Isabella [name] 07/30/2016, 8:50 PM

## 2016-07-30 NOTE — B[facility] Group Notes (Signed)
[place] Group Notes:  (Clinical Social Work)  07/30/2016  11:15-12:00PM  Summary of Progress/Problems:   Today's process group involved patients discussing their feelings related to being hospitalized, as well as benefits they see to being in the hospital.  These were itemized on the whiteboard, and then the group brainstormed specific ways in which they could seek for those same benefits to happen when they discharge and go back home. The patient expressed a primary feeling about being hospitalized is "good."  She could not identify any goals for helping her stay out of the hospital.  Type of Therapy:  Group Therapy - Process  Participation Level:  Active  Participation Quality:  Attentive  Affect:  Depressed and Flat  Cognitive:  did not speak enough to be assessed  Insight:  Poor  Engagement in Therapy:  Poor  Modes of Intervention:  Exploration, Discussion  Selmer Dominion, LCSW 07/30/2016, 1:09 PM

## 2016-07-31 NOTE — Progress Notes (Signed)
Patient ID: Isabella [name], female   DOB: 07-28-1975, 41 y.o.   MRN: [id number]   D: Pt continues to be very flat and depressed on the unit. Pt did however today engage by coming to the dayroom for groups. Pt took all medications as prescribed by the doctor, no issues or concerns noted. Pt reported that her depression was a 4, her hopelessness was a 4, and her anxiety was a 5. Pt reported that he goal for today was that he did not know. Pt reported to the interpretor that she felt much better and that she was going to stay out of the bed today and take a shower.  Pt reported being negative SI/HI, no AH/VH noted. A: 15 min checks continued for patient safety. R: Pt safety maintained.

## 2016-07-31 NOTE — Progress Notes (Signed)
Adult Psychoeducational Group Note  Date:  07/31/2016 Time:  9:06 PM  Group Topic/Focus:  Wrap-Up Group:   The focus of this group is to help patients review their daily goal of treatment and discuss progress on daily workbooks.  Participation Level:  Minimal  Participation Quality:  Resistant  Affect:  Flat  Cognitive:  Alert  Insight: None  Engagement in Group:  Limited  Modes of Intervention:  Discussion and Support  Additional Comments:  Patient states she has "no goal", her day was "good", and she has "no goal" for tomorrow.  Theone Stanley Teiara Baria 07/31/2016, 9:06 PM

## 2016-07-31 NOTE — B[facility] Group Notes (Signed)
[place] Group Notes:  (Clinical Social Work)  07/31/2016  11:00AM-12:00PM  Summary of Progress/Problems:  The main focus of today's process group was to listen to a variety of genres of music and to identify that different types of music provoke different responses.  The patient then was able to identify personally what was soothing for them, as well as energizing, as well as how patient can personally use this knowledge in sleep habits, with depression, and with other symptoms.  The patient expressed at the beginning of group the overall feeling of "tired" and said at the end of group she was no longer tired.  Other than when she was called on directly and gave very short answers, she did not talk.  Type of Therapy:  Music Therapy   Participation Level:  Active  Participation Quality:  Attentive   Affect:  Flat and Depressed  Cognitive:  N/A  Insight:  Improving  Engagement in Therapy:  Improving  Modes of Intervention:   Activity, Exploration  Selmer Dominion, LCSW 07/31/2016

## 2016-07-31 NOTE — Progress Notes (Signed)
[facility] MD Progress Note  07/31/2016 1:45 PM Isabella [name]  MRN:  [id number]  Subjective:  In room, Spanish interpreter present.  No c/o.  Denies any discomfort  Objective: Patient seen and chart reviewed. Discussed patient with treatment team.  Per nursing, family had concerns about feet swelling.  Discussed with patient need to ambulate throughout day and avoid lower ext dependency. Pt seen today in the dayroom, dressed and fairly groomed.  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  Past Medical History:  Past Medical History:  Diagnosis Date  . Bipolar disorder ([place])   . Hypertension   . Migraine 07/13/2016  . Schizophrenia ([place])    History reviewed. No pertinent surgical history. Family History:  Family History  Problem Relation Age of Onset  . Mental illness Neg Hx    Family Psychiatric  History: Please see H&P.  Social History: Please see H&P.  History  Alcohol Use No     History  Drug Use No    Social History   Social History  . Marital status: Single    Spouse name: N/A  . Number of children: N/A  . Years of education: N/A   Social History Main Topics  . Smoking status: Smoker, Current Status Unknown  . Smokeless tobacco: Never Used  . Alcohol use No  . Drug use: No  . Sexual activity: No   Other Topics Concern  . None   Social History Narrative  . None   Additional Social History:    Pain Medications: See PTA Prescriptions: See PTA Over the Counter: See PTA History of alcohol / drug use?: No history of alcohol / drug abuse  Sleep: Fair  Appetite:  Poor improving  Current Medications: Current Facility-Administered Medications  Medication Dose Route Frequency Provider Last Rate Last Dose  . acetaminophen (TYLENOL)  tablet 650 mg  650 mg Oral Q6H PRN Ethelene Hal, NP   650 mg at 07/19/16 1132  . alum & mag hydroxide-simeth (MAALOX/MYLANTA) 200-200-20 MG/5ML suspension 30 mL  30 mL Oral Q4H PRN Ethelene Hal, NP      . aspirin EC tablet 81 mg  81 mg Oral Daily Laverle Hobby, PA-C   81 mg at 07/31/16 0804  . benztropine (COGENTIN) tablet 0.5 mg  0.5 mg Oral QHS Ursula Alert, MD   0.5 mg at 07/30/16 2206  . clonazePAM (KLONOPIN) tablet 0.25 mg  0.25 mg Oral BID Ursula Alert, MD   0.25 mg at 07/31/16 0806  . doxepin (SINEQUAN) capsule 25 mg  25 mg Oral QHS Ursula Alert, MD   25 mg at 07/30/16 2206  . feeding supplement (ENSURE ENLIVE) (ENSURE ENLIVE) liquid 237 mL  237 mL Oral BID BM Saramma Eappen, MD   237 mL at 07/31/16 7169  . hydrALAZINE (APRESOLINE) tablet 10 mg  10 mg Oral Q6H PRN Laverle Hobby, PA-C      . hydrocerin (EUCERIN) cream   Topical BID Ursula Alert, MD      . hydrOXYzine (ATARAX/VISTARIL) tablet 25 mg  25 mg Oral Q6H PRN Ethelene Hal, NP   25 mg at 07/17/16 2204  . lidocaine (LIDODERM) 5 % 1 patch  1 patch Transdermal Daily Ursula Alert, MD   1 patch at 07/31/16 0804  . losartan (COZAAR) tablet  25 mg  25 mg Oral Daily Laverle Hobby, PA-C   25 mg at 07/31/16 6599  . magnesium hydroxide (MILK OF MAGNESIA) suspension 30 mL  30 mL Oral Daily PRN Ethelene Hal, NP      . megestrol (MEGACE) 400 MG/10ML suspension 400 mg  400 mg Oral BID Ursula Alert, MD   400 mg at 07/31/16 0804  . OLANZapine (ZYPREXA) tablet 10 mg  10 mg Oral QHS Ursula Alert, MD   10 mg at 07/30/16 2206  . propranolol (INDERAL) tablet 10 mg  10 mg Oral BID Ursula Alert, MD   10 mg at 07/31/16 0804  . traZODone (DESYREL) tablet 50 mg  50 mg Oral QHS PRN Ursula Alert, MD   50 mg at 07/25/16 2039  . venlafaxine XR (EFFEXOR-XR) 24 hr capsule 225 mg  225 mg Oral Q breakfast Kerrie Buffalo, NP   225 mg at 07/31/16 3570   Lab Results: No results found for this or any previous visit  (from the past 48 hour(s)).  Blood Alcohol level:  Lab Results  Component Value Date   ETH <5 17/79/3903   Metabolic Disorder Labs: Lab Results  Component Value Date   HGBA1C 5.4 01/31/2016   MPG 108 01/31/2016   Lab Results  Component Value Date   PROLACTIN 124.2 (H) 01/31/2016   Lab Results  Component Value Date   CHOL 152 01/31/2016   TRIG 147 01/31/2016   HDL 39 (L) 01/31/2016   CHOLHDL 3.9 01/31/2016   VLDL 29 01/31/2016   LDLCALC 84 01/31/2016   Physical Findings: AIMS: Facial and Oral Movements Muscles of Facial Expression: None, normal Lips and Perioral Area: None, normal Jaw: None, normal Tongue: None, normal,Extremity Movements Upper (arms, wrists, hands, fingers): Minimal Lower (legs, knees, ankles, toes): None, normal, Trunk Movements Neck, shoulders, hips: None, normal, Overall Severity Severity of abnormal movements (highest score from questions above): None, normal Incapacitation due to abnormal movements: None, normal Patient's awareness of abnormal movements (rate only patient's report): No Awareness, Dental Status Current problems with teeth and/or dentures?: No Does patient usually wear dentures?: No  CIWA:  CIWA-Ar Total: 1 COWS:  COWS Total Score: 1  Musculoskeletal: Strength & Muscle Tone: within normal limits Gait & Station: normal Patient leans: N/A  Psychiatric Specialty Exam: Physical Exam  Nursing note and vitals reviewed.   Review of Systems  Cardiovascular: Positive for leg swelling.  Psychiatric/Behavioral: Positive for depression. The patient is nervous/anxious.   All other systems reviewed and are negative.   Blood pressure 107/69, pulse 61, temperature 98.2 F (36.8 C), resp. rate 18.There is no height or weight on file to calculate BMI.  General Appearance: Fairly Groomed  Eye Contact:  Fair  Speech:  Normal Rate  Volume:  Decreased  Mood:  Anxious, Depressed and Dysphoric, improving on all fronts  Affect:  Congruent   Thought Process: linear  Orientation:  Full (Time, Place, and Person)  Thought Content:  Paranoid Ideation and Rumination   Suicidal Thoughts:  No  Homicidal Thoughts:  No  Memory:  Immediate;   Fair Recent;   Fair Remote;   Poor  Judgement:  Fair  Insight:  Shallow  Psychomotor Activity:  Decreased  Concentration:  Concentration: Fair and Attention Span: Fair  Recall:  [employer] of Knowledge:  Fair  Language:  Fair  Akathisia:  No  Handed:  Right  AIMS (if indicated):     Assets:  Physical Health Social Support  ADL's:  Intact  Cognition: wnl  Sleep:  Number of Hours: 7.5   Schizophrenia, catatonic type ([place]) improving Will continue today 07/31/16 plan as below except where it is noted.  Treatment Plan Summary:  More visible in milieu, continues to need a lot of prompting to take care of ADLs as well as to participate. Will continue to readjust medications , continue treatment. Discussed feet swelling with patient.   VS reviewed    Daily contact with patient to assess and evaluate symptoms and progress in treatment, Medication management and Plan see below    For schizophrenia: Will continue Zyprexa 10 mg po qhs. Will add Cogentin 0.5 mg po qhs for EPS.  For anxiety sx: Will start Klonopin 0.25 mg po bid.   For negative sx:  Effexor XR to 225 mg po daily.   For insomnia: Doxepin to 25 mg po qhs.   For appetite: Megace to 400 mg po bid.   Labs reviewed- lipid panel - wnl , Pl - elevated - will monitor closely.  EKG reviewed - nonspecific T wave abnormality, tachycardia.  Patient encouraged to attend groups , participate in milieu.  CSW will continue to work on disposition.   Janett Labella, NP [facility] 07/31/2016, 1:45 PM

## 2016-08-01 MED ORDER — OLANZAPINE 2.5 MG PO TABS
12.5000 mg | ORAL_TABLET | Freq: Every day | ORAL | Status: DC
  Administered 2016-08-01: 12.5 mg via ORAL
  Filled 2016-08-01 (×3): qty 1

## 2016-08-01 NOTE — Progress Notes (Signed)
[facility] MD Progress Note  08/01/2016 12:29 PM Isabella [name]  MRN:  [id number]  Subjective: Patient states " I am still anxious , depressed, , but is better than before."   Objective: Patient seen and chart reviewed.Discussed patient with treatment team.  Pt today seen as anxious, fidgety , wringing her hands throughout the evaluation. Pt also seems depressed , but is able to better communicate than before and is more visible in milieu. Pt tolerating her medications well, denies ADRs. Per mother , with whom she lives , pt is not fidgety or anxious like this at baseline. Pt continues to need support.   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  Past Medical History:  Past Medical History:  Diagnosis Date  . Bipolar disorder ([place])   . Hypertension   . Migraine 07/13/2016  . Schizophrenia ([place])    History reviewed. No pertinent surgical history. Family History:  Family History  Problem Relation Age of Onset  . Mental illness Neg Hx    Family Psychiatric  History: Please see H&P.  Social History: Please see H&P.  History  Alcohol Use No     History  Drug Use No    Social History   Social History  . Marital status: Single    Spouse name: N/A  . Number of children: N/A  . Years of education: N/A   Social History Main Topics  . Smoking status: Smoker, Current Status Unknown  . Smokeless tobacco: Never Used  . Alcohol use No  . Drug use: No  . Sexual activity: No   Other Topics Concern  . None   Social History Narrative  . None   Additional Social History:    Pain Medications: See PTA Prescriptions: See PTA Over the Counter: See PTA History of alcohol / drug use?: No history of alcohol / drug abuse  Sleep: Fair  Appetite:   improving improving  Current Medications: Current Facility-Administered Medications  Medication Dose Route Frequency Provider Last Rate Last Dose  . acetaminophen (TYLENOL) tablet 650 mg  650 mg Oral Q6H PRN Ethelene Hal, NP   650 mg at 07/19/16 1132  . alum & mag hydroxide-simeth (MAALOX/MYLANTA) 200-200-20 MG/5ML suspension 30 mL  30 mL Oral Q4H PRN Ethelene Hal, NP      . aspirin EC tablet 81 mg  81 mg Oral Daily Laverle Hobby, PA-C   81 mg at 08/01/16 8588  . benztropine (COGENTIN) tablet 0.5 mg  0.5 mg Oral QHS Ursula Alert, MD   0.5 mg at 07/31/16 2124  . clonazePAM (KLONOPIN) tablet 0.25 mg  0.25 mg Oral BID Ursula Alert, MD   0.25 mg at 08/01/16 0832  . doxepin (SINEQUAN) capsule 25 mg  25 mg Oral QHS Ursula Alert, MD   25 mg at 07/31/16 2124  . feeding supplement (ENSURE ENLIVE) (ENSURE ENLIVE) liquid 237 mL  237 mL Oral BID BM Ericberto Padget, MD   237 mL at 08/01/16 1027  . hydrALAZINE (APRESOLINE) tablet 10 mg  10 mg Oral Q6H PRN Laverle Hobby, PA-C      . hydrocerin (EUCERIN) cream   Topical BID Ursula Alert, MD   1 application at 50/27/74 0835  . hydrOXYzine (ATARAX/VISTARIL) tablet 25 mg  25 mg Oral Q6H PRN Ethelene Hal,  NP   25 mg at 07/17/16 2204  . lidocaine (LIDODERM) 5 % 1 patch  1 patch Transdermal Daily Ursula Alert, MD   1 patch at 07/31/16 0804  . losartan (COZAAR) tablet 25 mg  25 mg Oral Daily Laverle Hobby, PA-C   25 mg at 08/01/16 1610  . magnesium hydroxide (MILK OF MAGNESIA) suspension 30 mL  30 mL Oral Daily PRN Ethelene Hal, NP      . megestrol (MEGACE) 400 MG/10ML suspension 400 mg  400 mg Oral BID Ursula Alert, MD   400 mg at 08/01/16 0835  . OLANZapine (ZYPREXA) tablet 12.5 mg  12.5 mg Oral QHS Jocilyn Trego, MD      . propranolol (INDERAL) tablet 10 mg  10 mg Oral BID Ursula Alert, MD   10 mg at 08/01/16 0834  . traZODone (DESYREL) tablet 50 mg  50 mg Oral QHS PRN Ursula Alert, MD   50 mg at 07/25/16 2039   . venlafaxine XR (EFFEXOR-XR) 24 hr capsule 225 mg  225 mg Oral Q breakfast Kerrie Buffalo, NP   225 mg at 08/01/16 9604   Lab Results: No results found for this or any previous visit (from the past 48 hour(s)).  Blood Alcohol level:  Lab Results  Component Value Date   ETH <5 54/01/8118   Metabolic Disorder Labs: Lab Results  Component Value Date   HGBA1C 5.4 01/31/2016   MPG 108 01/31/2016   Lab Results  Component Value Date   PROLACTIN 124.2 (H) 01/31/2016   Lab Results  Component Value Date   CHOL 152 01/31/2016   TRIG 147 01/31/2016   HDL 39 (L) 01/31/2016   CHOLHDL 3.9 01/31/2016   VLDL 29 01/31/2016   LDLCALC 84 01/31/2016   Physical Findings: AIMS: Facial and Oral Movements Muscles of Facial Expression: None, normal Lips and Perioral Area: None, normal Jaw: None, normal Tongue: None, normal,Extremity Movements Upper (arms, wrists, hands, fingers): None, normal Lower (legs, knees, ankles, toes): None, normal, Trunk Movements Neck, shoulders, hips: None, normal, Overall Severity Severity of abnormal movements (highest score from questions above): None, normal Incapacitation due to abnormal movements: None, normal Patient's awareness of abnormal movements (rate only patient's report): No Awareness, Dental Status Current problems with teeth and/or dentures?: No Does patient usually wear dentures?: No  CIWA:  CIWA-Ar Total: 1 COWS:  COWS Total Score: 1  Musculoskeletal: Strength & Muscle Tone: within normal limits Gait & Station: normal Patient leans: N/A  Psychiatric Specialty Exam: Physical Exam  Nursing note and vitals reviewed.   Review of Systems  Psychiatric/Behavioral: Positive for depression. The patient is nervous/anxious.   All other systems reviewed and are negative.   Blood pressure 123/72, pulse 85, temperature 98.2 F (36.8 C), resp. rate 18.There is no height or weight on file to calculate BMI.  General Appearance: Fairly Groomed  Eye  Contact:  Fair  Speech:  Normal Rate  Volume:  Decreased  Mood:  Anxious and Depressed anxiety is still high , fidgety , depression is improving  Affect:  Depressed  Thought Process: linear  Orientation:  Full (Time, Place, and Person)  Thought Content:  Paranoid Ideation and Rumination   Suicidal Thoughts:  No  Homicidal Thoughts:  No  Memory:  Immediate;   Fair Recent;   Fair Remote;   Poor  Judgement:  Fair  Insight:  Shallow  Psychomotor Activity:  Restlessness  Concentration:  Concentration: Poor and Attention Span: Poor  Recall:  [employer] of Knowledge:  Fair  Language:  Fair  Akathisia:  No  Handed:  Right  AIMS (if indicated):     Assets:  Physical Health Social Support  ADL's:  Intact  Cognition: wnl  Sleep:  Number of Hours: 6.75   Schizophrenia, 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   Treatment Plan Summary:  Patient today seen as anxious , continues to need medication changes to help with same, continue to encourage to make use of coping skills. Continue treatment.     Daily contact with patient to assess and evaluate symptoms and progress in treatment, Medication management and Plan see below    For schizophrenia: Will increase Zyprexa to 12.5 mg po qhs. Will continue Cogentin 0.5 mg po qhs for EPS.  For anxiety sx: Klonopin 0.25 mg po bid. Make use of Vistaril prn as needed. Coping techniques/mindfulness.    For negative sx:  Effexor XR  225 mg po daily.   For insomnia: Doxepin 25 mg po qhs.   For appetite: Megace to 400 mg po bid.   Labs reviewed- lipid panel - wnl , Pl - elevated - will monitor closely.  EKG reviewed - nonspecific T wave abnormality, tachycardia.  Patient encouraged to attend groups , participate in milieu.  CSW will continue to work on disposition.   Sarayu Prevost, MD  08/01/2016, 12:29 PM

## 2016-08-01 NOTE — Progress Notes (Signed)
D: Pt appeared to be more fidgety tonight than previous days with the Probation officer. When asked about her day pt motioned with her hand to mean "so so".  However, pt was more spontaneous with her conversation today, using the interpretor less. Pt has no questions or concerns.    A:  Support and encouragement was offered. 15 min checks continued for safety.  R: Pt remains safe.

## 2016-08-01 NOTE — Progress Notes (Addendum)
Nursing Note 08/01/2016 [id number]  Data Reports sleeping good and feeling "better." Did not completes self-inventory. Affect flat mood "better."   Denied SI/HI.  When asked if she was hearing voices she said "yes," and said they are the "same."  Denies visual hallucinations.   Patient spent most of day in day area after encouragement from nurse.  Attending groups.  Interpreter present during periods throughout day. Guarded and minimal.  C/O ongoing back pain but declined PRN's and lidocaine patch.  Action Spoke with patient 1:1, nurse offered support to patient throughout shift.  Continues to be monitored on 15 minute checks for safety.  Response Remains safe on unit.

## 2016-08-01 NOTE — Progress Notes (Signed)
Writer spoke with patient and her mother through her interpreter. Her mother reports that when her daughter leaves here with medications and has her appointment with [facility] they change her medications. Patient's mom reports that she has lots of pill bottles at home because she is told to stop the medications prescribed here. Writer encouraged patient and her mother to speak with her doctor or social worker concerning this matter.

## 2016-08-01 NOTE — Tx Team (Signed)
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 acetaminophen (TYLENOL) tablet 650 mg  650 mg Oral Q6H PRN Ethelene Hal, NP   650 mg at 07/19/16 1132  . alum & mag hydroxide-simeth (MAALOX/MYLANTA) 200-200-20 MG/5ML suspension 30 mL  30 mL Oral Q4H PRN Ethelene Hal, NP      . aspirin EC tablet 81 mg  81 mg Oral Daily Laverle Hobby, PA-C   81 mg at 08/01/16 6659  . benztropine (COGENTIN) tablet 0.5 mg  0.5 mg Oral QHS Ursula Alert, MD   0.5 mg at 07/31/16 2124  . clonazePAM (KLONOPIN) tablet 0.25 mg  0.25 mg Oral BID Ursula Alert, MD   0.25 mg at 08/01/16 0832  . doxepin (SINEQUAN) capsule 25 mg  25 mg Oral QHS Ursula Alert, MD   25 mg at 07/31/16 2124  . feeding supplement (ENSURE ENLIVE) (ENSURE ENLIVE) liquid 237 mL  237 mL Oral BID BM Saramma Eappen, MD   237 mL at 07/31/16 1512  . hydrALAZINE (APRESOLINE) tablet 10 mg  10 mg Oral Q6H PRN Laverle Hobby, PA-C      . hydrocerin (EUCERIN) cream   Topical BID Ursula Alert, MD   1 application at 93/57/01 0835  . hydrOXYzine (ATARAX/VISTARIL) tablet 25 mg  25 mg Oral Q6H PRN Ethelene Hal, NP   25 mg at 07/17/16 2204  . lidocaine (LIDODERM) 5 % 1 patch  1 patch Transdermal Daily Ursula Alert, MD   1 patch at 07/31/16 0804  . losartan (COZAAR) tablet 25 mg  25 mg Oral Daily Laverle Hobby, PA-C   25 mg at 08/01/16 7793  . magnesium hydroxide (MILK OF MAGNESIA) suspension 30 mL  30 mL Oral Daily PRN Ethelene Hal, NP      . megestrol (MEGACE) 400 MG/10ML suspension 400 mg  400 mg Oral BID Ursula Alert, MD   400 mg at 08/01/16 0835  . OLANZapine (ZYPREXA)  tablet 10 mg  10 mg Oral QHS Ursula Alert, MD   10 mg at 07/31/16 2125  . propranolol (INDERAL) tablet 10 mg  10 mg Oral BID Ursula Alert, MD   10 mg at 08/01/16 0834  . traZODone (DESYREL) tablet 50 mg  50 mg Oral QHS PRN Ursula Alert, MD   50 mg at 07/25/16 2039  . venlafaxine XR (EFFEXOR-XR) 24 hr capsule 225 mg  225 mg Oral Q breakfast Kerrie Buffalo, NP   225 mg at 08/01/16 9030    PTA Medications: Prescriptions Prior to Admission  Medication Sig Dispense Refill Last Dose  . lurasidone (LATUDA) 40 MG TABS tablet Take 40 mg by mouth every evening.   Past Month at Unknown time  . meclizine (ANTIVERT) 25 MG tablet Take 25 mg by mouth 3 (three) times daily as needed for dizziness.   Past Month at Unknown time    Treatment Modalities: Medication Management, Group therapy, Case management,  1 to 1 session with clinician, Psychoeducation, Recreational therapy.   Physician Treatment Plan for Primary Diagnosis: Schizophrenia, catatonic type ([place]) Long Term Goal(s): Improvement in symptoms so as ready for discharge  Short Term Goals: Ability to identify changes in lifestyle to reduce recurrence of condition will improve Ability to  verbalize feelings will improve Compliance with prescribed medications will improve  Medication Management: Evaluate patient's response, side effects, and tolerance of medication regimen.  Therapeutic Interventions: 1 to 1 sessions, Unit Group sessions and Medication administration.  Evaluation of Outcomes: Adequate for Discharge  Physician Treatment Plan for Secondary Diagnosis: Principal Problem:   Schizophrenia, catatonic type ([place])   Long Term Goal(s): Improvement in symptoms so as ready for discharge  Short Term Goals: Ability to identify changes in lifestyle to reduce recurrence of condition will improve Ability to verbalize feelings will improve Compliance with prescribed medications will improve   Medication Management: Evaluate patient's  response, side effects, and tolerance of medication regimen.  Therapeutic Interventions: 1 to 1 sessions, Unit Group sessions and Medication administration.  Evaluation of Outcomes: Adequate for Discharge   3/9:  For schizophrenia:   Will continue Latuda 40 mg po bid with meals.  For negative sx: Will continue to reduce ativan , make it prn. Will continue Effexor 50 mg po bid.  For insomnia: reviewed Trazodone to 150 mg po qhs.  3/14: For schizophrenia: Continue to crosstaper Latuda with Zyprexa. Will increase Zyprexa to 10 mg po qhs.   For negative sx: Will continue Effexor XR 150 mg po daily.   For insomnia: Increase Doxepin to 25 mg po qhs.   For appetite: Increase Megace to 400 mg po bid.   RN Treatment Plan for Primary Diagnosis: Schizophrenia, catatonic type ([place]) Long Term Goal(s): Knowledge of disease and therapeutic regimen to maintain health will improve  Short Term Goals: Ability to participate in decision making will improve and Compliance with prescribed medications will improve  Medication Management: RN will administer medications as ordered by provider, will assess and evaluate patient's response and provide education to patient for prescribed medication. RN will report any adverse and/or side effects to prescribing provider.  Therapeutic Interventions: 1 on 1 counseling sessions, Psychoeducation, Medication administration, Evaluate responses to treatment, Monitor vital signs and CBGs as ordered, Perform/monitor CIWA, COWS, AIMS and Fall Risk screenings as ordered, Perform wound care treatments as ordered.  Evaluation of Outcomes: Adequate for Discharge   Recreational Therapy Treatment Plan for Primary Diagnosis: Schizophrenia, catatonic type ([place]) Long Term Goal(s): LTG- Patient will participate in recreation therapy tx in at least 2 group sessions without prompting from LRT.  Short Term Goals: Patient will demonstrate increased ability to follow  instructions, as demonstrated by ability to follow LRT instructions on first prompt during recreation therapy group sessions.  Treatment Modalities: Group and Pet Therapy  Therapeutic Interventions: Psychoeducation  Evaluation of Outcomes: Adequate for Discharge   LCSW Treatment Plan for Primary Diagnosis: Schizophrenia, catatonic type ([place]) Long Term Goal(s): Safe transition to appropriate next level of care at discharge, Engage patient in therapeutic group addressing interpersonal concerns.  Short Term Goals: Engage patient in aftercare planning with referrals and resources  Therapeutic Interventions: Assess for all discharge needs, 1 to 1 time with Social worker, Explore available resources and support systems, Assess for adequacy in community support network, Educate family and significant other(s) on suicide prevention, Complete Psychosocial Assessment, Interpersonal group therapy.  Evaluation of Outcomes: Met  Return home, follow up [place] in Treatment: Attending groups: Yes Participating in groups: Yes Taking medication as prescribed: Yes Toleration medication: Yes, no side effects reported at this time Family/Significant other contact made: Yes Patient understands diagnosis:No  Limited insight Discussing patient identified problems/goals with staff: Yes Medical problems stabilized or resolved: Yes Denies suicidal/homicidal ideation: Yes Issues/concerns per patient  self-inventory: None Other: N/A  New problem(s) identified: None identified at this time.   New Short Term/Long Term Goal(s): None identified at this time.   Discharge Plan or Barriers: 3/14:  Pt not d/ced last weekend as planned as pt became selectively mute on Saturday, and has continued to be since that time 3/19:  Pt states she is feeling better today.  She is visible in the milieu, is not wringing her hands, and ssays "My head feels better."  Possible d/c tomorrow-return home and follow  up at [facility]  Reason for Continuation of Hospitalization:  Medication stabilization   Estimated Length of Stay: 3/5 days  Attendees: Patient: 08/01/2016  8:36 AM  Physician: Ursula Alert, MD 08/01/2016  8:36 AM  Nursing: Otilio Carpen, RN 08/01/2016  8:36 AM  RN Care Manager: Lars Pinks, RN 08/01/2016  8:36 AM  Social Worker: Ripley Fraise 08/01/2016  8:36 AM  Recreational Therapist: Laretta Bolster  08/01/2016  8:36 AM  Other: Norberto Sorenson 08/01/2016  8:36 AM  Other:  08/01/2016  8:36 AM    Scribe for Treatment Team:  Roque Lias LCSW 08/01/2016 8:36 AM

## 2016-08-01 NOTE — BHH Group Notes (Signed)
[place] LCSW Group Therapy  08/01/2016 1:15 pm  Type of Therapy: Process Group Therapy  Participation Level:  Active  Participation Quality:  Appropriate  Affect:  Flat  Cognitive:  Oriented  Insight:  Improving  Engagement in Group:  Limited  Engagement in Therapy:  Limited  Modes of Intervention:  Activity, Clarification, Education, Problem-solving and Support  Summary of Progress/Problems: Today's group addressed the issue of overcoming obstacles.  Patients were asked to identify their biggest obstacle post d/c that stands in the way of their on-going success, and then problem solve as to how to manage this. Stayed the entire time, engaged throughout.  Minimal interaction.  When asked about obstacles, stated she needs to find a job.  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  Other group members suggested she talk to them about a job.  "It's who you know."  Trish Mage 08/01/2016   3:33 PM

## 2016-08-01 NOTE — Progress Notes (Signed)
Recreation Therapy Notes  Date: 08/01/16 Time: 1000 Location: 500 Hall Dayroom  Group Topic: Coping Skills  Goal Area(s) Addresses:  Patient will be able to identify positive coping skills. Patient will be able to identify the benefits of coping skills. Patient will be able to identify benefits of using coping skills post d/c.  Behavioral Response: Minimal  Intervention: Magazines, glue sticks, scissors, coping skills worksheet  Activity: Patients were to identify coping skills that could be used for diversions, social, cognitive, tension releasers and physically.  Patients were to find pictures in the magazines that depicted the coping skills they would use for each of these areas and glue them to their worksheet.  Education:Coping Skills, Discharge Planning.   Education Outcome: Acknowledges understanding/In group clarification offered/Needs additional education.   Clinical Observations/Feedback: Pt was flat and gave minimal participation.  Pt identified a dog as a coping skill for diversions.  Pt did state using positive coping skills will allow her to "be positive".   Victorino Sparrow, LRT/CTRS        Victorino Sparrow A 08/01/2016 12:06 PM

## 2016-08-02 DIAGNOSIS — F4010 Social phobia, unspecified: Secondary | ICD-10-CM | POA: Clinically undetermined

## 2016-08-02 MED ORDER — PROPRANOLOL HCL 10 MG PO TABS: 10.0000 mg | ORAL_TABLET | Freq: Two times a day (BID) | ORAL | 0 refills | Status: DC

## 2016-08-02 MED ORDER — BENZTROPINE MESYLATE 0.5 MG PO TABS: 0.5000 mg | ORAL_TABLET | Freq: Every day | ORAL | 0 refills | Status: DC

## 2016-08-02 MED ORDER — HYDROXYZINE HCL 25 MG PO TABS: 25.0000 mg | ORAL_TABLET | Freq: Four times a day (QID) | ORAL | 0 refills | Status: DC | PRN

## 2016-08-02 MED ORDER — CLONAZEPAM 0.5 MG PO TABS: 0.2500 mg | ORAL_TABLET | Freq: Two times a day (BID) | ORAL | 0 refills | Status: DC

## 2016-08-02 MED ORDER — TRAZODONE HCL 50 MG PO TABS: 50.0000 mg | ORAL_TABLET | Freq: Every evening | ORAL | 0 refills | Status: DC | PRN

## 2016-08-02 MED ORDER — LOSARTAN POTASSIUM 25 MG PO TABS: 25.0000 mg | ORAL_TABLET | Freq: Every day | ORAL | 0 refills | Status: DC

## 2016-08-02 MED ORDER — MEGESTROL ACETATE 400 MG/10ML PO SUSP: 400.0000 mg | Freq: Two times a day (BID) | ORAL | 0 refills | Status: DC

## 2016-08-02 MED ORDER — DOXEPIN HCL 25 MG PO CAPS: 25.0000 mg | ORAL_CAPSULE | Freq: Every day | ORAL | 0 refills | Status: DC

## 2016-08-02 MED ORDER — VENLAFAXINE HCL ER 75 MG PO CP24: 225.0000 mg | ORAL_CAPSULE | Freq: Every day | ORAL | 0 refills | Status: DC

## 2016-08-02 MED ORDER — OLANZAPINE 2.5 MG PO TABS: 12.5000 mg | ORAL_TABLET | Freq: Every day | ORAL | 0 refills | Status: DC

## 2016-08-02 NOTE — BHH Suicide Risk Assessment (Signed)
[facility] Discharge Suicide Risk Assessment   Principal Problem: Schizophrenia, catatonic type [facility]) Discharge Diagnoses:  Patient Active Problem List   Diagnosis Date Noted  . Social anxiety disorder [F40.10] 08/02/2016  . 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 The patient is nervous/anxious (improving) and has insomnia (improving).   All other systems reviewed and are negative.   Blood pressure (!) 124/91, pulse (!) 119, temperature 99 F (37.2 C), temperature source Oral, resp. rate 16.There is no height or weight on file to calculate BMI.  General Appearance: Casual  Eye Contact::  Fair  Speech:  Clear and Coherent409  Volume:  Normal  Mood:  Anxious  Affect:  Congruent  Thought Process:  Goal Directed and Descriptions of Associations: Intact  Orientation:  Full (Time, Place, and Person)  Thought Content:  Logical  Suicidal Thoughts:  No  Homicidal Thoughts:  No  Memory:  Immediate;   Fair Recent;   Fair Remote;   Fair  Judgement:  Fair  Insight:  Fair  Psychomotor Activity:  Normal  Concentration:  Fair  Recall:  [employer] of Knowledge:Fair  Language: Fair  Akathisia:  No  Handed:  Right  AIMS (if indicated):   0  Assets:  Communication Skills Desire for Improvement  Sleep:  Number of Hours: 6.75  Cognition: WNL  ADL's:  Intact   Mental Status Per Nursing Assessment::   On Admission:     Demographic Factors:  NA  Loss Factors: NA  Historical Factors: Impulsivity  Risk Reduction Factors:   Living with another person, especially a relative and Positive social support  Continued Clinical Symptoms:  Schizophrenia:   Paranoid or undifferentiated type  Cognitive Features  That Contribute To Risk:  None    Suicide Risk:  Minimal: No identifiable suicidal ideation.  Patients presenting with no risk factors but with morbid ruminations; may be classified as minimal risk based on the severity of the depressive symptoms  Follow-up Information    [place] [facility] Follow up on 07/26/2016.   Why:  Tuesday at 2:30 for your hospital follow up appointment Contact information: [place] [address] [phone]           Plan Of Care/Follow-up recommendations:  Activity:  no restrictions Diet:  regular Tests:  follow up on your Prolactin level Other:  none  Tala Eber, MD 08/02/2016, 9:23 AM

## 2016-08-02 NOTE — Progress Notes (Signed)
Patient ID: Isabella [name], female   DOB: Nov 28, 1975, 41 y.o.   MRN: [id number] Patient discharged to home in the care of her mother.  Patient was anxious and stated that she did not want to leave the hospital because she likes it here.  Patient denies SI, HI and AVH.  Patient mood was flat and anxious as she discharged.  Patient acknowledged understanding of discharge instructions and all information was reviewed with patient in the presence of her mother and a spanish interpreter.

## 2016-08-02 NOTE — Discharge Summary (Signed)
Physician Discharge Summary Note  Patient:  Isabella [name] is an 41 y.o., female MRN:  [id number] DOB:  07-09-1975 Patient phone:  [phone] (home)  Patient address:   [address] 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) Discharge Diagnoses: Patient Active Problem List   Diagnosis Date Noted  . Social anxiety disorder [F40.10] 08/02/2016  . 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 Bipolar disorder ([place])   . Hypertension   . Migraine 07/13/2016  . Schizophrenia ([place])    History reviewed. No pertinent surgical history. Family History:  Family History  Problem Relation Age of Onset  . Mental illness Neg Hx    Family Psychiatric  History:  See HPI Social History:  History  Alcohol Use No     History  Drug Use No    Social History   Social History  . Marital status: Single    Spouse name: N/A  . Number of children: N/A  . Years of education: N/A   Social History Main Topics  . Smoking status: Smoker, Current Status Unknown  . Smokeless tobacco: Never Used  . Alcohol use No  . Drug use: No  . Sexual activity: No   Other Topics Concern  . None   Social History Narrative  . None    Hospital Course:  Isabella [name] is 41 yo, female from [place] with hx of schizophrenia, seen in the Ed initially for worsening depression and psychosis.  She was last treated at [facility] was 6 mos ago.  She presented as previous, thought blocking, "dazed and confused" look initially.  Collateral information revealed that patient stopped her medications for months and decompensated.  Isabella [name] was admitted for Schizophrenia, catatonic type [facility]) and crisis management.   Patient was treated with medications with their indications listed below in detail under Medication List.  Medical problems were identified and treated as needed.  Home medications were restarted as appropriate.  Improvement was monitored by observation and Isabella [name] daily report of symptom reduction.  Emotional and mental status was monitored by daily self inventory reports completed by Isabella [name] and clinical staff.  Patient reported continued improvement, denied any new concerns.  Patient had been compliant on medications and denied side effects.  Support and encouragement was provided.         Isabella [name] was evaluated by the treatment team for stability and plans for continued recovery upon discharge.  Patient was offered further treatment options upon discharge including Residential, Intensive Outpatient and Outpatient treatment. Patient will follow up with agency listed below for medication management and counseling.  Encouraged patient to maintain satisfactory support network and home environment.  Advised to adhere to medication compliance and outpatient treatment follow up.  Prescriptions provided.       Isabella [name] motivation was an integral factor for scheduling further treatment.  Employment, transportation, bed availability, health status, family support, and any pending legal issues were also considered during patient's hospital stay.  Upon completion of this admission the patient was both mentally and medically stable for discharge denying suicidal/homicidal ideation, auditory/visual/tactile hallucinations, delusional thoughts and paranoia.      Physical Findings: AIMS: Facial and Oral Movements Muscles of Facial Expression: None, normal Lips  and Perioral Area: None, normal Jaw: None, normal Tongue: None, normal,Extremity Movements Upper (arms, wrists, hands, fingers): None, normal Lower (legs, knees, ankles, toes): None, normal, Trunk Movements Neck, shoulders, hips:  None, normal, Overall Severity Severity of abnormal movements (highest score from questions above): None, normal Incapacitation due to abnormal movements: None, normal Patient's awareness of abnormal movements (rate only patient's report): No Awareness, Dental Status Current problems with teeth and/or dentures?: No Does patient usually wear dentures?: No  CIWA:  CIWA-Ar Total: 1 COWS:  COWS Total Score: 1  Musculoskeletal: Strength & Muscle Tone: within normal limits Gait & Station: normal Patient leans: N/A  Psychiatric Specialty Exam:  See MD SRA Physical Exam  ROS  Blood pressure (!) 124/91, pulse (!) 119, temperature 99 F (37.2 C), temperature source Oral, resp. rate 16.There is no height or weight on file to calculate BMI.    Have you used any form of tobacco in the last 30 days? (Cigarettes, Smokeless Tobacco, Cigars, and/or Pipes): No  Has this patient used any form of tobacco in the last 30 days? (Cigarettes, Smokeless Tobacco, Cigars, and/or Pipes) Yes, N/A  Blood Alcohol level:  Lab Results  Component Value Date   ETH <5 81/19/1478    Metabolic Disorder Labs:  Lab Results  Component Value Date   HGBA1C 5.4 01/31/2016   MPG 108 01/31/2016   Lab Results  Component Value Date   PROLACTIN 124.2 (H) 01/31/2016   Lab Results  Component Value Date   CHOL 152 01/31/2016   TRIG 147 01/31/2016   HDL 39 (L) 01/31/2016   CHOLHDL 3.9 01/31/2016   VLDL 29 01/31/2016   LDLCALC 84 01/31/2016    See Psychiatric Specialty Exam and Suicide Risk Assessment completed by Attending Physician prior to discharge.  Discharge destination:  Home  Is patient on multiple antipsychotic therapies at discharge:  No   Has Patient had three or more failed trials of antipsychotic monotherapy by history:  No  Recommended Plan for Multiple Antipsychotic Therapies: NA   Allergies as of 08/02/2016   No Known Allergies     Medication List    STOP taking these medications    lurasidone 40 MG Tabs tablet Commonly known as:  LATUDA   meclizine 25 MG tablet Commonly known as:  ANTIVERT     TAKE these medications     Indication  benztropine 0.5 MG tablet Commonly known as:  COGENTIN Take 1 tablet (0.5 mg total) by mouth at bedtime.  Indication:  Extrapyramidal Reaction caused by Medications   clonazePAM 0.5 MG tablet Commonly known as:  KLONOPIN Take 0.5 tablets (0.25 mg total) by mouth 2 (two) times daily. Please instruct patient to cut the tablet in half.  Indication:  severe anxiety disorder   doxepin 25 MG capsule Commonly known as:  SINEQUAN Take 1 capsule (25 mg total) by mouth at bedtime.  Indication:  Depression, Major Depressive Disorder, Psychotic Depressive Illness   hydrOXYzine 25 MG tablet Commonly known as:  ATARAX/VISTARIL Take 1 tablet (25 mg total) by mouth every 6 (six) hours as needed for anxiety.  Indication:  Anxiety Neurosis   losartan 25 MG tablet Commonly known as:  COZAAR Take 1 tablet (25 mg total) by mouth daily. Start taking on:  08/03/2016  Indication:  High Blood Pressure Disorder   megestrol 400 MG/10ML suspension Commonly known as:  MEGACE Take 10 mLs (400 mg total) by mouth 2 (two) times daily.  Indication:  General Weight Loss and Wasting   OLANZapine 2.5  MG tablet Commonly known as:  ZYPREXA Take 5 tablets (12.5 mg total) by mouth at bedtime.  Indication:  Major Depressive Disorder   propranolol 10 MG tablet Commonly known as:  INDERAL Take 1 tablet (10 mg total) by mouth 2 (two) times daily.  Indication:  High Blood Pressure Disorder   traZODone 50 MG tablet Commonly known as:  DESYREL Take 1 tablet (50 mg total) by mouth at bedtime as needed for sleep.  Indication:  Trouble Sleeping   venlafaxine XR 75 MG 24 hr capsule Commonly known as:  EFFEXOR-XR Take 3 capsules (225 mg total) by mouth daily with breakfast. Start taking on:  08/03/2016  Indication:  Major Depressive Disorder      Follow-up  Information    [facility] Follow up on 08/03/2016.   Why:  Wednesday at 1:00 for your hospital follow up appointment Contact information: [place] [address] [phone]           Follow-up recommendations:  Activity:  as tol Diet:  as tol  Comments:  1.  Take all your medications as prescribed.   2.  Report any adverse side effects to outpatient provider. 3.  Patient instructed to not use alcohol or illegal drugs while on prescription medicines. 4.  In the event of worsening symptoms, instructed patient to call 911, the crisis hotline or go to nearest emergency room for evaluation of symptoms.  Signed: Janett Labella, NP [facility] 08/02/2016, 1:35 PM

## 2016-08-02 NOTE — Progress Notes (Signed)
Recreation Therapy Notes  Date: 08/02/16 Time: 1000 Location: 500 Hall Dayroom  Group Topic: Anger Management  Goal Area(s) Addresses:  Patient will identify triggers for anger.  Patient will identify physical reaction to anger.   Patient will identify benefit of using coping skills when angry.  Behavioral Response: Minimal  Intervention: Umbrella worksheet, pencils  Activity: Umbrella of Emotion.  Patients were given a worksheet with an umbrella with examples of feelings that can be covered by anger.  Patients were to fill in the umbrella with some of the underlying feelings they have that cause anger.  If patients were unable to identify an emotion, they were to identify situations that causes anger.  Once patients identified their causes of anger, they were to identify coping skills to combat the anger.  Education: Anger Management, Discharge Planning   Education Outcome: Acknowledges education/In group clarification offered/Needs additional education.   Clinical Observations/Feedback: Pt was quiet.  When prompted, pt stated she gets angry when people laugh in front of her.  For a coping skill, pt stated she uses her imagination.   Victorino Sparrow, LRT/CTRS          Victorino Sparrow A 08/02/2016 12:28 PM

## 2016-08-02 NOTE — Progress Notes (Signed)
  [facility] Adult Case Management Discharge Plan :  Will you be returning to the same living situation after discharge:  Yes,  home At discharge, do you have transportation home?: Yes,  mother Do you have the ability to pay for your medications: Yes,  MCD  Release of information consent forms completed and in the chart;  Patient's signature needed at discharge.  Patient to Follow up at: Follow-up Information    [facility] Follow up on 08/03/2016.   Why:  Wednesday at 10:30  for your hospital follow up appointment Contact information: [place] [address] [phone]           Next level of care provider has access to [place] and Suicide Prevention discussed: Yes,  yes  Have you used any form of tobacco in the last 30 days? (Cigarettes, Smokeless Tobacco, Cigars, and/or Pipes): No  Has patient been referred to the Quitline?: N/A patient is not a smoker  Patient has been referred for addiction treatment: [facility] 08/02/2016, 10:17 AM

## 2017-12-29 ENCOUNTER — Emergency Department (HOSPITAL_COMMUNITY)
Admission: EM | Admit: 2017-12-29 | Discharge: 2017-12-31 | Disposition: A | Discharge status: Other Acute Inpatient Hospital | Payer: Medicaid Other | Attending: Emergency Medicine | Admitting: Emergency Medicine

## 2017-12-29 ENCOUNTER — Other Ambulatory Visit: Payer: Self-pay

## 2017-12-29 ENCOUNTER — Encounter (HOSPITAL_COMMUNITY): Payer: Self-pay | Admitting: Emergency Medicine

## 2017-12-29 DIAGNOSIS — Z79899 Other long term (current) drug therapy: Secondary | ICD-10-CM | POA: Insufficient documentation

## 2017-12-29 DIAGNOSIS — F209 Schizophrenia, unspecified: Secondary | ICD-10-CM | POA: Insufficient documentation

## 2017-12-29 DIAGNOSIS — F329 Major depressive disorder, single episode, unspecified: Secondary | ICD-10-CM | POA: Insufficient documentation

## 2017-12-29 DIAGNOSIS — F419 Anxiety disorder, unspecified: Secondary | ICD-10-CM | POA: Insufficient documentation

## 2017-12-29 DIAGNOSIS — F17200 Nicotine dependence, unspecified, uncomplicated: Secondary | ICD-10-CM | POA: Insufficient documentation

## 2017-12-29 DIAGNOSIS — I10 Essential (primary) hypertension: Secondary | ICD-10-CM | POA: Insufficient documentation

## 2017-12-29 DIAGNOSIS — F5089 Other specified eating disorder: Secondary | ICD-10-CM | POA: Insufficient documentation

## 2017-12-29 DIAGNOSIS — Z046 Encounter for general psychiatric examination, requested by authority: Secondary | ICD-10-CM | POA: Insufficient documentation

## 2017-12-29 DIAGNOSIS — Z9114 Patient's other noncompliance with medication regimen: Secondary | ICD-10-CM | POA: Insufficient documentation

## 2017-12-29 LAB — URINALYSIS, ROUTINE W REFLEX MICROSCOPIC
Bacteria, UA: NONE SEEN
Bilirubin Urine: NEGATIVE
GLUCOSE, UA: NEGATIVE mg/dL
HGB URINE DIPSTICK: NEGATIVE
Ketones, ur: 80 mg/dL — AB
Leukocytes, UA: NEGATIVE
Nitrite: NEGATIVE
Protein, ur: 30 mg/dL — AB
SPECIFIC GRAVITY, URINE: 1.024 (ref 1.005–1.030)
pH: 6 (ref 5.0–8.0)

## 2017-12-29 LAB — CBC
HEMATOCRIT: 50.5 % — AB (ref 36.0–46.0)
Hemoglobin: 16.8 g/dL — ABNORMAL HIGH (ref 12.0–15.0)
MCH: 30.4 pg (ref 26.0–34.0)
MCHC: 33.3 g/dL (ref 30.0–36.0)
MCV: 91.3 fL (ref 78.0–100.0)
Platelets: 464 10*3/uL — ABNORMAL HIGH (ref 150–400)
RBC: 5.53 MIL/uL — ABNORMAL HIGH (ref 3.87–5.11)
RDW: 12.8 % (ref 11.5–15.5)
WBC: 12.9 10*3/uL — ABNORMAL HIGH (ref 4.0–10.5)

## 2017-12-29 LAB — COMPREHENSIVE METABOLIC PANEL
ALK PHOS: 90 U/L (ref 38–126)
ALT: 22 U/L (ref 0–44)
AST: 24 U/L (ref 15–41)
Albumin: 4.1 g/dL (ref 3.5–5.0)
Anion gap: 13 (ref 5–15)
BILIRUBIN TOTAL: 1.4 mg/dL — AB (ref 0.3–1.2)
BUN: 22 mg/dL — AB (ref 6–20)
CHLORIDE: 109 mmol/L (ref 98–111)
CO2: 19 mmol/L — ABNORMAL LOW (ref 22–32)
CREATININE: 0.89 mg/dL (ref 0.44–1.00)
Calcium: 9.6 mg/dL (ref 8.9–10.3)
GFR calc Af Amer: >60 mL/min (ref >60)
Glucose, Bld: 120 mg/dL — ABNORMAL HIGH (ref 70–99)
Potassium: 3.5 mmol/L (ref 3.5–5.1)
Sodium: 141 mmol/L (ref 135–145)
Total Protein: 8.3 g/dL — ABNORMAL HIGH (ref 6.5–8.1)

## 2017-12-29 LAB — RAPID URINE DRUG SCREEN, HOSP PERFORMED
Amphetamines: NOT DETECTED
BARBITURATES: NOT DETECTED
Benzodiazepines: NOT DETECTED
COCAINE: NOT DETECTED
Opiates: NOT DETECTED
Tetrahydrocannabinol: NOT DETECTED

## 2017-12-29 LAB — ETHANOL

## 2017-12-29 LAB — I-STAT BETA HCG BLOOD, ED (MC, WL, AP ONLY)

## 2017-12-29 MED ORDER — ONDANSETRON HCL 4 MG PO TABS: 4.0000 mg | ORAL_TABLET | Freq: Three times a day (TID) | ORAL | Status: DC | PRN

## 2017-12-29 MED ORDER — SODIUM CHLORIDE 0.9 % IV BOLUS
2000.0000 mL | Freq: Once | INTRAVENOUS | Status: AC
  Administered 2017-12-29: 2000 mL via INTRAVENOUS

## 2017-12-29 MED ORDER — ALUM & MAG HYDROXIDE-SIMETH 200-200-20 MG/5ML PO SUSP: 30.0000 mL | Freq: Four times a day (QID) | ORAL | Status: DC | PRN

## 2017-12-29 MED ORDER — ACETAMINOPHEN 325 MG PO TABS: 650.0000 mg | ORAL_TABLET | ORAL | Status: DC | PRN

## 2017-12-29 NOTE — BHH Counselor (Signed)
Per Herbie Baltimore, RN pt has not been assigned a room and to check back. Clinician to check back to complete TTS assessment.   Vertell Novak, MS, [facility], [facility] Triage Specialist [phone]

## 2017-12-29 NOTE — ED Triage Notes (Addendum)
Pt arrives from home with family members who report pt has not been eating or drinking lately, won't get out of bed. Pt is fidgeting with her hands in the chair, won't answer many questions. She states she doesn't feel well and has some nausea. Pt denies SI., has hx of schizophrenia and bipolar

## 2017-12-29 NOTE — ED Notes (Signed)
Sandwich and drink provided to pt 

## 2017-12-29 NOTE — ED Notes (Signed)
TTS at bedside w/ sister in law and mother.

## 2017-12-29 NOTE — BHH Counselor (Signed)
Clinician looked in pt's chart pt is still in WPT (not assigned a room). Clinician will continue to check back.   Vertell Novak, MS, [facility], [facility] Triage Specialist [phone]

## 2017-12-29 NOTE — ED Provider Notes (Signed)
Patient placed in Quick Look pathway, seen and evaluated   Chief Complaint: Anxiety  HPI: Patient with history of bipolar disorder presents off of her medications not getting out of bed, decreased oral intake over the past 13 days.  Patient does not talk.  Family states that symptoms may have been triggered by death in the family.  No pain reported at the current time.  Patient recently seen and treated for kidney stones.  They went [place] and he did not have anyone to talk with her, prompting emergency department evaluation.  ROS:  Positive ROS: (+) Anxiety, nausea Negative ROS: (-) Fevers  Physical Exam:   Gen: No distress  Neuro: Awake and Alert  Skin: Warm    Focused Exam: Heart RRR, nml S1,S2, no m/r/g; Lungs CTAB; Abd soft, NT, no rebound or guarding; Ext 2+ pedal pulses bilaterally, no edema; psych blunted affect, patient does not respond to questioning.  BP (!) 138/106   Pulse (!) 120   Temp 98.9 F (37.2 C) (Oral)   Resp 18   SpO2 96%   Plan: Medical clearance, TTS evaluation.  Initiation of care has begun. The patient has been counseled on the process, plan, and necessity for staying for the completion/evaluation, and the remainder of the medical screening examination    Carlisle Cater, Hershal Coria 12/29/17 [place]    Carmin Muskrat, MD 12/31/17 [phone]

## 2017-12-29 NOTE — BH Assessment (Addendum)
Tele Assessment Note   Patient Name: Isabella [name] MRN: [id number] Referring Physician: Carlisle Cater, PA. Location of Patient: [facility] Location of Provider: [place] is an 42 y.o. female, who presents voluntary and accompanied to [facility] by her sister-in-law and mother. Clinician obtained consent from the pt to have her mother and sister-in -law present. Clinician noted the pt only answers, "yes or no." Clinician asked the pt if she has thought of suicide? Pt replied, "no," in a very low tone. Clinician asked the pt the questions again to be certain. Clinician observed the pt slightly shook her head, "no." Pt denies, HI, AVH, self-injurious behaviors and access to weapons. Clinician asked the pt's family, "what brought the pt to the hospital?" Pt's sister-in-law reported, the pt has been sick for thirteen days not wanting to eat, or get out of bed. Pt's sister-in-law reported, the pt stop talking three days ago. Pt's mother reported, she can lay the pt down and she will that way for hours. Pt's mother reported, she can stand the pt up and she will stay standing for hours. Pt's mother reported, the pt used the bathroom on herself today and yesterday.   Clinician was unable to assess the following: stressors, history of abuse, DSS involvement, history of violence, history of suicide attempt. Pt's UDS is negative. Per chart, pt has a previous inpatient admissions at [facility] in 2018.  Pt presents quiet/alert in scrubs with elective mutism. Pt's eye contact was poor. Pt's mood was preoccupied. Pt's affect was blunted. Pt's thought process was thought blocking. Pt's judgement was impaired. Pt's concentration, insight, and impulse control are poor. Pt's mother reported, she does not feel the pt is safe at home as she will not eat, and it is hard for her to care for the pt. Pt reported, if inpatient treatment is recommends she would "think about" signing-in voluntarily.  During the assessment, pt appeared to be responding to internal stimuli and/or delusional thought content.   Diagnosis: Schizophrenia [facility])  Past Medical History:  Past Medical History:  Diagnosis Date  . Bipolar disorder ([place])   . Hypertension   . Migraine 07/13/2016  . Schizophrenia ([place])     History reviewed. No pertinent surgical history.  Family History:  Family History  Problem Relation Age of Onset  . Mental illness Neg Hx     Social History:  reports that she has been smoking. She has never used smokeless tobacco. She reports that she does not drink alcohol or use drugs.  Additional Social History:  Alcohol / Drug Use Pain Medications: See MAR Prescriptions: See MAR Over the Counter: See MAR History of alcohol / drug use?: No history of alcohol / drug abuse(Family denies. )  CIWA: CIWA-Ar BP: (!) 129/107 Pulse Rate: (!) 120 COWS:    Allergies: No Known Allergies  Home Medications:  (Not in a hospital admission)  OB/GYN Status:  No LMP recorded.  General Assessment Data Assessment unable to be completed: Yes Reason for not completing assessment: Pt not assigned a room. TTS to check back. Location of Assessment: [facility] ED TTS Assessment: In system Is this a Tele or Face-to-Face Assessment?: Tele Assessment Is this an Initial Assessment or a Re-assessment for this encounter?: Initial Assessment Marital status: Separated Living Arrangements: Parent Can pt return to current living arrangement?: Yes Admission Status: Voluntary Is patient capable of signing voluntary admission?: Yes Referral Source: Self/Family/Friend Insurance type: Medicaid.      Crisis Care Plan Living Arrangements: Parent  Legal Guardian: Other:(Self. ) Name of Psychiatrist: Family denies.  Name of Therapist: Family denies.   Education Status Is patient currently in school?: No Is the patient employed, unemployed or receiving disability?: (UTA)  Risk to self with the past 6  months Suicidal Ideation: No(Pt denies. ) Has patient been a risk to self within the past 6 months prior to admission? : No Suicidal Intent: No Has patient had any suicidal intent within the past 6 months prior to admission? : No Is patient at risk for suicide?: No Suicidal Plan?: No Has patient had any suicidal plan within the past 6 months prior to admission? : No Access to Means: No What has been your use of drugs/alcohol within the last 12 months?: Family denies.  Previous Attempts/Gestures: (UTA) How many times?: (UTA) Other Self Harm Risks: Pt denies.  Intentional Self Injurious Behavior: None Family Suicide History: No Recent stressful life event(s): Other (Comment)(UTA) Persecutory voices/beliefs?: No Depression: No Substance abuse history and/or treatment for substance abuse?: No Suicide prevention information given to non-admitted patients: Not applicable  Risk to Others within the past 6 months Homicidal Ideation: No(Pt denies. ) Does patient have any lifetime risk of violence toward others beyond the six months prior to admission? : (UTA) Thoughts of Harm to Others: No(Pt denies. ) Current Homicidal Intent: No Current Homicidal Plan: No Access to Homicidal Means: No Identified Victim: NA History of harm to others?: (UTA) Does patient have access to weapons?: No(Family denies. ) Criminal Charges Pending?: No Does patient have a court date: No Is patient on probation?: No     Mental Status Report Appearance/Hygiene: In scrubs Eye Contact: Poor Speech: Elective mutism Level of Consciousness: Quiet/awake Affect: Blunted Anxiety Level: Minimal Thought Processes: Circumstantial Judgement: Impaired Orientation: Not oriented Obsessive Compulsive Thoughts/Behaviors: None  Cognitive Functioning Concentration: Poor Memory: Recent Impaired Is patient IDD: No Is patient DD?: No Insight: Poor Impulse Control: Fair Appetite: Poor Have you had any weight changes?  : (Pt has not eaten in days. ) Sleep: No Change Total Hours of Sleep: 7 Vegetative Symptoms: Staying in bed, Not bathing, Decreased grooming  ADLScreening [facility] Assessment Services) Patient's cognitive ability adequate to safely complete daily activities?: Yes Patient able to express need for assistance with ADLs?: Yes Independently performs ADLs?: Yes (appropriate for developmental age)  Prior Inpatient Therapy Prior Inpatient Therapy: Yes Prior Therapy Dates: 06/2006. Prior Therapy Facilty/Provider(s): [facility]. Reason for Treatment: AVH.  Prior Outpatient Therapy Prior Outpatient Therapy: No Does patient have an ACCT team?: No Does patient have Intensive In-House Services?  : No Does patient have [facility] services? : No Does patient have P4CC services?: No  ADL Screening (condition at time of admission) Patient's cognitive ability adequate to safely complete daily activities?: Yes Is the patient deaf or have difficulty hearing?: No Does the patient have difficulty seeing, even when wearing glasses/contacts?: No Does the patient have difficulty concentrating, remembering, or making decisions?: Yes Patient able to express need for assistance with ADLs?: Yes Does the patient have difficulty dressing or bathing?: No(Pt is choosing not to bathe. ) Independently performs ADLs?: Yes (appropriate for developmental age) Does the patient have difficulty walking or climbing stairs?: No Weakness of Legs: None Weakness of Arms/Hands: None  Home Assistive Devices/Equipment Home Assistive Devices/Equipment: None    Abuse/Neglect Assessment (Assessment to be complete while patient is alone) Abuse/Neglect Assessment Can Be Completed: Unable to assess, patient is non-responsive or altered mental status     Advance Directives (For Healthcare) Does Patient Have  a Medical Advance Directive?: (UTA)          Disposition: Patriciaann Clan, PA recommends inpatient treatment. Disposition  discussed with Sophia, PA and  Wilhelmenia Blase, RN. TTS to seek placement.    Disposition Initial Assessment Completed for this Encounter: Yes  This service was provided via telemedicine using a 2-way, interactive audio and video technology.  Names of all persons participating in this telemedicine service and their role in this encounter. Name:Emboly Navas Role: Sister-in-law.  Name: Glade Lloyd Role: Mother.          Vertell Novak 12/29/2017 10:39 PM    Vertell Novak, MS, [facility], [facility] Triage Specialist [phone]

## 2017-12-30 MED ORDER — CLONAZEPAM 0.125 MG PO TBDP
0.2500 mg | ORAL_TABLET | Freq: Two times a day (BID) | ORAL | Status: DC
  Administered 2017-12-30 (×2): 0.25 mg via ORAL
  Filled 2017-12-30 (×2): qty 2

## 2017-12-30 MED ORDER — LOSARTAN POTASSIUM 50 MG PO TABS
25.0000 mg | ORAL_TABLET | Freq: Every day | ORAL | Status: DC
  Administered 2017-12-30 – 2017-12-31 (×2): 25 mg via ORAL
  Filled 2017-12-30 (×2): qty 1

## 2017-12-30 MED ORDER — TRAZODONE HCL 50 MG PO TABS: 50.0000 mg | ORAL_TABLET | Freq: Every evening | ORAL | Status: DC | PRN

## 2017-12-30 MED ORDER — LORAZEPAM 1 MG PO TABS
1.0000 mg | ORAL_TABLET | Freq: Four times a day (QID) | ORAL | Status: DC
  Administered 2017-12-30 (×2): 1 mg via ORAL
  Filled 2017-12-30 (×2): qty 1

## 2017-12-30 MED ORDER — OLANZAPINE 5 MG PO TABS
12.5000 mg | ORAL_TABLET | Freq: Every day | ORAL | Status: DC
  Administered 2017-12-30: 12.5 mg via ORAL
  Filled 2017-12-30: qty 3

## 2017-12-30 MED ORDER — MEGESTROL ACETATE 400 MG/10ML PO SUSP
400.0000 mg | Freq: Two times a day (BID) | ORAL | Status: DC
  Administered 2017-12-30 – 2017-12-31 (×3): 400 mg via ORAL
  Filled 2017-12-30 (×4): qty 10

## 2017-12-30 MED ORDER — DOXEPIN HCL 25 MG PO CAPS
25.0000 mg | ORAL_CAPSULE | Freq: Every day | ORAL | Status: DC
Start: 2017-12-30 — End: 2017-12-31
  Administered 2017-12-30 (×2): 25 mg via ORAL
  Filled 2017-12-30 (×3): qty 1

## 2017-12-30 MED ORDER — OLANZAPINE 5 MG PO TABS
10.0000 mg | ORAL_TABLET | Freq: Two times a day (BID) | ORAL | Status: DC
  Administered 2017-12-30: 10 mg via ORAL
  Filled 2017-12-30: qty 2

## 2017-12-30 MED ORDER — LORAZEPAM 2 MG/ML IJ SOLN: 1.0000 mg | Freq: Four times a day (QID) | INTRAMUSCULAR | Status: DC

## 2017-12-30 MED ORDER — HALOPERIDOL LACTATE 5 MG/ML IJ SOLN: 5.0000 mg | Freq: Two times a day (BID) | INTRAMUSCULAR | Status: DC

## 2017-12-30 MED ORDER — BENZTROPINE MESYLATE 1 MG PO TABS
0.5000 mg | ORAL_TABLET | Freq: Every day | ORAL | Status: DC
  Administered 2017-12-30 (×2): 0.5 mg via ORAL
  Filled 2017-12-30 (×2): qty 1

## 2017-12-30 MED ORDER — PROPRANOLOL HCL 10 MG PO TABS
10.0000 mg | ORAL_TABLET | Freq: Two times a day (BID) | ORAL | Status: DC
  Administered 2017-12-30 – 2017-12-31 (×4): 10 mg via ORAL
  Filled 2017-12-30 (×5): qty 1

## 2017-12-30 MED ORDER — VENLAFAXINE HCL ER 75 MG PO CP24
225.0000 mg | ORAL_CAPSULE | Freq: Every day | ORAL | Status: DC
  Administered 2017-12-30 – 2017-12-31 (×2): 225 mg via ORAL
  Filled 2017-12-30 (×2): qty 1

## 2017-12-30 MED ORDER — HALOPERIDOL 1 MG PO TABS
5.0000 mg | ORAL_TABLET | Freq: Two times a day (BID) | ORAL | Status: DC
  Administered 2017-12-30 – 2017-12-31 (×2): 5 mg via ORAL
  Filled 2017-12-30 (×2): qty 5

## 2017-12-30 MED ORDER — HYDROXYZINE HCL 25 MG PO TABS: 25.0000 mg | ORAL_TABLET | Freq: Four times a day (QID) | ORAL | Status: DC | PRN

## 2017-12-30 NOTE — BH Assessment (Signed)
[facility] Assessment Progress Note     12/30/17 Patient has been accepted for admission to [facility] by Marvia Pickles, NP, Dr Nancy Fetter will be attending.  Patient can be admitted to  [facility] [id number] after 8 pm.

## 2017-12-30 NOTE — ED Notes (Signed)
Pt's lunch tray ordered.  

## 2017-12-30 NOTE — ED Notes (Signed)
Encouraging pt to eat breakfast. Pt siting on side of bed, alert.

## 2017-12-30 NOTE — ED Notes (Signed)
Pt took meds by self but only took one by one with no assistance to take meds

## 2017-12-30 NOTE — BH Assessment (Signed)
[place] Assessment Progress Note      Patient was seen for reassessment.  She presents with a very flat affect and is able to identify that she is depressed.  She looks very sad and is very quiet and soft-spoken.  Appears to possibly be having some thought blocking and hesitates before answering questions.  Patient states that she is not suicidal or homicidal, but states that she has experienced thoughts in the past and has previously been hospitalized for her depression.  Patient is guarded and TTS is not sure if patient is saying everything that she needs to say concerning her situation.  Patient appears to need inpatient treatment and states that she would be willing to come into the hospital voluntarily.

## 2017-12-30 NOTE — ED Notes (Signed)
Re-TTS completed. Encouraged pt to eat/drink - continues to decline.

## 2017-12-30 NOTE — ED Notes (Signed)
Pt is ambulating in hallway w/Sitter.

## 2017-12-30 NOTE — ED Notes (Signed)
Pt on phone talking to her mother. Pt initially declined to talk to her mother - stating "I will talk to her later". RN and Actuary assisted pt w/ambulating to desk - pt noted to be talking in very low, soft voice - may have been repeating same phrases. Pt ambulated back to room w/o difficulty.

## 2017-12-30 NOTE — ED Notes (Signed)
Pt drank milkshake - fed by Sitter/NT.

## 2017-12-30 NOTE — ED Notes (Signed)
Pt sitting on side of bed - pt will change position when prompted by staff - otherwise, pt remains in same position.

## 2017-12-30 NOTE — ED Notes (Signed)
Pt would not hold medicine cup w/meds - RN placed in pt's mouth - pt tolerated well and swallowed them w/water w/o difficulty. Pt refusing to eat even after much encouragement. Sitter encouraging po fluids.

## 2017-12-30 NOTE — ED Notes (Signed)
Bkfst ordered for patient

## 2017-12-30 NOTE — ED Notes (Signed)
Pt ambulated to the bathroom with little assistance and was able to use to the bathroom without any prompting.

## 2017-12-30 NOTE — Progress Notes (Signed)
Patient meets criteria for inpatient treatment.. CSW faxed referrals to the following facilities for review.  [place], [facility], [place], [place], [facility], [place], [place], [place], [place], [place], [place], [place], [place], [place], [place], [place], [place], [place], [place], and [place].   TTS will continue to seek bed placement.     Herbert Moors, MSW, LCSW, LCAS 12/30/2017 11:18 AM

## 2017-12-30 NOTE — ED Provider Notes (Signed)
[facility] EMERGENCY DEPARTMENT Provider Note   CSN: [id number] Arrival date & time: 12/29/17  1716     History   Chief Complaint Chief Complaint  Patient presents with  . Psychiatric Evaluation    HPI Isabella [name] is a 42 y.o. female presenting for psychiatric evaluation.  Level 5 caveat, patient refusing to speak.  History obtained from chart review and family members.  Family states that about 13 days ago, patient had an uncle who died.  Since then, she has been acting abnormally.  She has not been taking her medications.  Over the past several days, she is refused to eat or drink anything.  Has a history of bipolar and schizophrenia.  Per chart review, patient has a history of medication noncompliance for schizophrenia, resulting in catatonic-like state.  When asked if she has any pain, patient shakes her head no.  Family denies any further complaints  HPI  Past Medical History:  Diagnosis Date  . Bipolar disorder ([place])   . Hypertension   . Migraine 07/13/2016  . Schizophrenia [facility])     Patient Active Problem List   Diagnosis Date Noted  . Social anxiety disorder 08/02/2016  . 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 No pertinent surgical history.   OB History   None      Home Medications    Prior to Admission medications   Medication Sig Start Date End Date Taking? Authorizing Provider  benztropine (COGENTIN) 0.5 MG tablet Take 1 tablet (0.5 mg total) by mouth at bedtime. 08/02/16   Kerrie Buffalo, NP  clonazePAM (KLONOPIN) 0.5 MG tablet Take 0.5 tablets (0.25 mg total) by mouth 2 (two) times daily. Please instruct patient to cut the tablet in half. 08/02/16   Kerrie Buffalo, NP  doxepin (SINEQUAN) 25 MG capsule Take 1 capsule (25 mg total) by mouth at bedtime. 08/02/16   Kerrie Buffalo, NP  hydrOXYzine (ATARAX/VISTARIL) 25 MG tablet Take 1 tablet (25 mg total) by mouth every 6 (six) hours as needed for  anxiety. 08/02/16   Kerrie Buffalo, NP  losartan (COZAAR) 25 MG tablet Take 1 tablet (25 mg total) by mouth daily. 08/03/16   Kerrie Buffalo, NP  megestrol (MEGACE) 400 MG/10ML suspension Take 10 mLs (400 mg total) by mouth 2 (two) times daily. 08/02/16   Kerrie Buffalo, NP  OLANZapine (ZYPREXA) 2.5 MG tablet Take 5 tablets (12.5 mg total) by mouth at bedtime. 08/02/16   Kerrie Buffalo, NP  propranolol (INDERAL) 10 MG tablet Take 1 tablet (10 mg total) by mouth 2 (two) times daily. 08/02/16   Kerrie Buffalo, NP  traZODone (DESYREL) 50 MG tablet Take 1 tablet (50 mg total) by mouth at bedtime as needed for sleep. 08/02/16   Kerrie Buffalo, NP  venlafaxine XR (EFFEXOR-XR) 75 MG 24 hr capsule Take 3 capsules (225 mg total) by mouth daily with breakfast. 08/03/16   Kerrie Buffalo, NP    Family History Family History  Problem Relation Age of Onset  . Mental illness Neg Hx     Social History Social History   Tobacco Use  . Smoking status: Smoker, Current Status Unknown  . Smokeless tobacco: Never Used  Substance Use Topics  . Alcohol use: No  . Drug use: No     Allergies   Patient has no known allergies.   Review of Systems Review of Systems  Unable to perform ROS: Psychiatric disorder     Physical Exam Updated Vital Signs BP 134/88  Pulse 100   Temp 98.9 F (37.2 C) (Oral)   Resp 17   SpO2 97%   Physical Exam  Constitutional:  Appears slightly disheveled.  Noncommunicative.  HENT:  Head: Normocephalic and atraumatic.  Eyes: Pupils are equal, round, and reactive to light. EOM are normal.  Neck: Normal range of motion.  Moving head in all directions  Cardiovascular: Regular rhythm and intact distal pulses.  Tachycardic around 120  Pulmonary/Chest: Effort normal and breath sounds normal. No respiratory distress. She has no wheezes.  Abdominal: Soft. She exhibits no distension and no mass. There is no tenderness. There is no guarding.  Musculoskeletal: Normal range  of motion.  Neurological: She is alert.  Skin: Skin is warm. No rash noted.  Psychiatric: She is withdrawn. She is noncommunicative.  Patient refusing to speak.  Rarely makes eye contact.  Is able to negatively and affirmatively shake her head yes and no to simple straightforward questions.  Nursing note and vitals reviewed.    ED Treatments / Results  Labs (all labs ordered are listed, but only abnormal results are displayed) Labs Reviewed  COMPREHENSIVE METABOLIC PANEL - Abnormal; Notable for the following components:      Result Value   CO2 19 (*)    Glucose, Bld 120 (*)    BUN 22 (*)    Total Protein 8.3 (*)    Total Bilirubin 1.4 (*)    All other components within normal limits  CBC - Abnormal; Notable for the following components:   WBC 12.9 (*)    RBC 5.53 (*)    Hemoglobin 16.8 (*)    HCT 50.5 (*)    Platelets 464 (*)    All other components within normal limits  URINALYSIS, ROUTINE W REFLEX MICROSCOPIC - Abnormal; Notable for the following components:   Color, Urine AMBER (*)    [place]earance HAZY (*)    Ketones, ur 80 (*)    Protein, ur 30 (*)    All other components within normal limits  ETHANOL  RAPID URINE DRUG SCREEN, HOSP PERFORMED  I-STAT BETA HCG BLOOD, ED ([facility], [facility], [facility] ONLY)    EKG None  Radiology No results found.  Procedures Procedures (including critical care time)  Medications Ordered in ED Medications  acetaminophen (TYLENOL) tablet 650 mg (has no administration in time range)  alum & mag hydroxide-simeth (MAALOX/MYLANTA) 200-200-20 MG/5ML suspension 30 mL (has no administration in time range)  ondansetron (ZOFRAN) tablet 4 mg (has no administration in time range)  benztropine (COGENTIN) tablet 0.5 mg (has no administration in time range)  clonazePAM (KLONOPIN) tablet 0.25 mg (has no administration in time range)  doxepin (SINEQUAN) capsule 25 mg (has no administration in time range)  hydrOXYzine (ATARAX/VISTARIL) tablet 25 mg (has no  administration in time range)  losartan (COZAAR) tablet 25 mg (has no administration in time range)  megestrol (MEGACE) 400 MG/10ML suspension 400 mg (has no administration in time range)  OLANZapine (ZYPREXA) tablet 12.5 mg (has no administration in time range)  propranolol (INDERAL) tablet 10 mg (has no administration in time range)  traZODone (DESYREL) tablet 50 mg (has no administration in time range)  venlafaxine XR (EFFEXOR-XR) 24 hr capsule 225 mg (has no administration in time range)  sodium chloride 0.9 % bolus 2,000 mL (0 mLs Intravenous Stopped 12/30/17 0036)     Initial Impression / Assessment and Plan / ED Course  I have reviewed the triage vital signs and the nursing notes.  Pertinent labs & imaging results that were  available during my care of the patient were reviewed by me and considered in my medical decision making (see chart for details).     Patient presenting for psychiatric evaluation.  Physical exam shows patient is tachycardic, but otherwise appears in no distress.  Responds to straightforward questions by shaking her head and affirmative or negative, but is not speaking.  Per family, patient is not eating, has not done so for several days.  Tachycardia may be due to dehydration.  Labs showed nonspecific leukocytosis at 12.9, patient is hemoconcentrated.  Likely dehydration related.  Will give fluid bolus and reassess.  After fluid bolus, heart rate proved to 100.  Per chart review, patient is on propanolol, although I do not know why.  Noncompliance with his medication may also be contributing to her tachycardia.  Discussed with behavioral health, patient to be admitted to [facility].   Final Clinical Impressions(s) / ED Diagnoses   Final diagnoses:  None    ED Discharge Orders    None       Franchot Heidelberg, PA-C 12/30/17 [id number]    Tegeler, Gwenyth Allegra, MD 12/30/17 1125

## 2017-12-30 NOTE — ED Notes (Signed)
Pt sitting on side of bed in room.

## 2017-12-30 NOTE — ED Notes (Addendum)
Pt breakfast tray arrived. 

## 2017-12-30 NOTE — ED Notes (Signed)
Pt needed help to get to the bathroom but was able to pull pants up by herself.

## 2017-12-30 NOTE — ED Notes (Addendum)
Pt declining to eat lunch. RN assisted - pt took 3 bites of food and drank 1/2 cup of water. TV turned on for pt as requested. Pt continues to speak in low, soft voice - very few words. Offered toileting - states "I'm ok".

## 2017-12-30 NOTE — ED Notes (Signed)
Pt took meds herself and drank water herself.

## 2017-12-31 ENCOUNTER — Inpatient Hospital Stay (HOSPITAL_COMMUNITY)
Admission: AD | Admit: 2017-12-31 | Discharge: 2018-01-10 | DRG: 885 | Disposition: A | Discharge status: Home / Self Care | Payer: Medicaid Other | Source: Intra-hospital | Attending: Psychiatry | Admitting: Psychiatry

## 2017-12-31 ENCOUNTER — Other Ambulatory Visit: Payer: Self-pay

## 2017-12-31 ENCOUNTER — Encounter (HOSPITAL_COMMUNITY): Payer: Self-pay

## 2017-12-31 DIAGNOSIS — Z9114 Patient's other noncompliance with medication regimen: Secondary | ICD-10-CM | POA: Diagnosis not present

## 2017-12-31 DIAGNOSIS — F419 Anxiety disorder, unspecified: Secondary | ICD-10-CM | POA: Diagnosis present

## 2017-12-31 DIAGNOSIS — F202 Catatonic schizophrenia: Secondary | ICD-10-CM | POA: Diagnosis not present

## 2017-12-31 DIAGNOSIS — G4710 Hypersomnia, unspecified: Secondary | ICD-10-CM | POA: Diagnosis present

## 2017-12-31 DIAGNOSIS — G4700 Insomnia, unspecified: Secondary | ICD-10-CM | POA: Diagnosis present

## 2017-12-31 DIAGNOSIS — Z79899 Other long term (current) drug therapy: Secondary | ICD-10-CM

## 2017-12-31 DIAGNOSIS — Z01810 Encounter for preprocedural cardiovascular examination: Secondary | ICD-10-CM | POA: Diagnosis not present

## 2017-12-31 DIAGNOSIS — F209 Schizophrenia, unspecified: Secondary | ICD-10-CM | POA: Diagnosis present

## 2017-12-31 DIAGNOSIS — R451 Restlessness and agitation: Secondary | ICD-10-CM | POA: Diagnosis not present

## 2017-12-31 DIAGNOSIS — I10 Essential (primary) hypertension: Secondary | ICD-10-CM | POA: Diagnosis present

## 2017-12-31 DIAGNOSIS — F200 Paranoid schizophrenia: Secondary | ICD-10-CM | POA: Diagnosis not present

## 2017-12-31 DIAGNOSIS — F333 Major depressive disorder, recurrent, severe with psychotic symptoms: Secondary | ICD-10-CM | POA: Diagnosis not present

## 2017-12-31 MED ORDER — MAGNESIUM HYDROXIDE 400 MG/5ML PO SUSP: 30.0000 mL | Freq: Every day | ORAL | Status: DC | PRN

## 2017-12-31 MED ORDER — TRAZODONE HCL 50 MG PO TABS: 50.0000 mg | ORAL_TABLET | Freq: Every evening | ORAL | Status: DC | PRN

## 2017-12-31 MED ORDER — ACETAMINOPHEN 325 MG PO TABS: 650.0000 mg | ORAL_TABLET | Freq: Four times a day (QID) | ORAL | Status: DC | PRN

## 2017-12-31 MED ORDER — ALUM & MAG HYDROXIDE-SIMETH 200-200-20 MG/5ML PO SUSP: 30.0000 mL | ORAL | Status: DC | PRN

## 2017-12-31 MED ORDER — HYDROXYZINE HCL 25 MG PO TABS: 25.0000 mg | ORAL_TABLET | Freq: Three times a day (TID) | ORAL | Status: DC | PRN

## 2017-12-31 NOTE — ED Notes (Signed)
Pt voiced understanding and agreement w/tx plan - accepted to [facility] - pt signed consent form - copy faxed to [facility] and copy sent to Medical Records. Pt calling her mother to advise of tx plan.

## 2017-12-31 NOTE — Tx Team (Signed)
Initial Treatment Plan 12/31/2017 7:33 PM [name] UDA:[id number]    PATIENT STRESSORS: Other: mental health problems   PATIENT STRENGTHS: Average or above average intelligence Physical Health Supportive family/friends   PATIENT IDENTIFIED PROBLEMS:   "I sleep too much"    "no energy"    "sad"           DISCHARGE CRITERIA:  Ability to meet basic life and health needs Improved stabilization in mood, thinking, and/or behavior Motivation to continue treatment in a less acute level of care Verbal commitment to aftercare and medication compliance  PRELIMINARY DISCHARGE PLAN: Outpatient therapy Return to previous living arrangement  PATIENT/FAMILY INVOLVEMENT: This treatment plan has been presented to and reviewed with the patient, Isabella, [name] patient has been given the opportunity to ask questions and make suggestions.  Waymond Cera, RN 12/31/2017, 7:33 PM

## 2017-12-31 NOTE — ED Notes (Signed)
Pt in shower.  

## 2017-12-31 NOTE — ED Notes (Addendum)
Pt feeding self lunch very slowly. RN encouraged pt to ambulate to desk so may talk to her on phone. Pt complied after much encouragement. Pt on phone at nurses' desk talking to her mother.

## 2017-12-31 NOTE — ED Notes (Signed)
Pt brushed her teeth herself. Pt feeding herself breakfast w/much encouragement from staff.

## 2017-12-31 NOTE — ED Notes (Signed)
Pt noted to be lying on bed w/eyes closed. Respirations even, unlabored. Snorous respirations noted.

## 2017-12-31 NOTE — Progress Notes (Signed)
Patient is a 42 yr old english-speaking hispanic female admitted voluntarily from [facility] for worsening depression - it had been reported by family that pt was not eating for 13 days, not getting out of bed and had stopped talking for three days. Pt has a hx of Schizophrenia and had been previously hospitalized here at [facility]  last year. Pt presents with a flat affect speaking very little - softly whispering in short phrases. Pt reports passive SI, 'for years' and contracts for safety by nodding head. Pt endorses visual hallucinations 'at times', but denies auditory hallucinations. Pt did not seem to be responding to internal stimuli during admission interview- but did appear to have some thought blocking. VS obtained. Skin assessment completed -revealing no abnormalities other than old surgical scar @ lower arm.Patient had no belongings with her. Admission paperwork completed and signed- Patient oriented to unit. Q15 min checks initiated for safety. Pt provided with po fluids. Pt remains safe on the unit

## 2017-12-31 NOTE — ED Notes (Signed)
Pt had showered herself w/much encouragement. Pt asked for comb - given. Pt sitting on side of bed, alert.

## 2017-12-31 NOTE — ED Notes (Signed)
Pt stands from sitting on bed, looks around, then sits back on bed.

## 2017-12-31 NOTE — ED Notes (Signed)
Complete bed changed

## 2017-12-31 NOTE — ED Notes (Signed)
Pt took meds by herself w/much encouragement.

## 2018-01-01 DIAGNOSIS — F209 Schizophrenia, unspecified: Principal | ICD-10-CM

## 2018-01-01 MED ORDER — OLANZAPINE 5 MG PO TBDP: 5.0000 mg | ORAL_TABLET | Freq: Three times a day (TID) | ORAL | Status: DC | PRN

## 2018-01-01 MED ORDER — HYDROXYZINE HCL 50 MG PO TABS: 50.0000 mg | ORAL_TABLET | Freq: Four times a day (QID) | ORAL | Status: DC | PRN

## 2018-01-01 MED ORDER — ARIPIPRAZOLE 15 MG PO TABS
15.0000 mg | ORAL_TABLET | Freq: Every day | ORAL | Status: DC
  Administered 2018-01-02 – 2018-01-09 (×8): 15 mg via ORAL
  Filled 2018-01-01 (×12): qty 1

## 2018-01-01 MED ORDER — ESCITALOPRAM OXALATE 10 MG PO TABS
10.0000 mg | ORAL_TABLET | Freq: Every day | ORAL | Status: DC
  Administered 2018-01-02 – 2018-01-07 (×6): 10 mg via ORAL
  Filled 2018-01-01 (×9): qty 1

## 2018-01-01 MED ORDER — ZIPRASIDONE MESYLATE 20 MG IM SOLR: 20.0000 mg | Freq: Two times a day (BID) | INTRAMUSCULAR | Status: DC | PRN

## 2018-01-01 NOTE — BHH Suicide Risk Assessment (Signed)
[facility] Admission Suicide Risk Assessment   Nursing information obtained from:  Patient Demographic factors:  Divorced or widowed Current Mental Status:  Suicidal ideation indicated by patient Loss Factors:  NA Historical Factors:  NA Risk Reduction Factors:  Living with another person, especially a relative, Positive social support  Total Time spent with patient: 1 hour Principal Problem: Schizophrenia ([place]) Diagnosis:   Patient Active Problem List   Diagnosis Date Noted  . Schizophrenia ([place]) [F20.9] 12/31/2017  . Social anxiety disorder [F40.10] 08/02/2016  . 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  [facility]). Score between 0-7:  no or low risk or alcohol related problems. Score between 8-15:  moderate risk of alcohol related problems. Score between 16-19:  high risk of alcohol related problems. Score 20 or above:  warrants further diagnostic evaluation for alcohol dependence and treatment.   CLINICAL FACTORS:   Severe Anxiety and/or Agitation Depression:   Severe Schizophrenia:   Paranoid or undifferentiated type More than one psychiatric diagnosis Currently Psychotic Unstable or Poor Therapeutic Relationship Previous Psychiatric Diagnoses and Treatments  Psychiatric Specialty Exam: Physical Exam  Nursing note and vitals reviewed.   ROS- See H&P for details  Blood pressure 101/62, pulse 69, temperature 98.4 F (36.9 C), temperature source Oral, resp. rate 16, height 5\' 3"  (1.6 m), weight 83.9 kg, SpO2 99 %.Body mass index is 32.77 kg/m.   COGNITIVE FEATURES THAT CONTRIBUTE TO RISK:  None    SUICIDE RISK:   Minimal: No identifiable suicidal ideation.  Patients presenting with no risk factors but with morbid ruminations; may be classified as minimal risk based  on the severity of the depressive symptoms  PLAN OF CARE: See H&P for details  I certify that inpatient services furnished can reasonably be expected to improve the patient's condition.   [name], MD 01/01/2018, 3:31 PM

## 2018-01-01 NOTE — BHH Counselor (Addendum)
Adult Comprehensive Assessment  Patient ID: Briasia Flinders, female   DOB: 10/20/1975, 42 y.o.   MRN: [id number]  Information Source: Information source: Patient  Current Stressors:  Patient states their primary concerns and needs for treatment are:: "Depression" Patient states their goals for this hospitilization and ongoing recovery are:: Unable to verbalize Employment / Job issues: Engineer, building services / Lack of resources (include bankruptcy): Fixed income Housing / Lack of housing: Dependent on parents  Living/Environment/Situation:  Living Arrangements: Parent Living conditions (as described by patient or guardian): Pt lives with both parents in [place]. Pt reports this is a good environment.  How long has patient lived in current situation?:Bsically since the divorce What is atmosphere in current home: Loving, Comfortable, Supportive  Family History:  Marital status: Divorced Divorced, when?: 7-8 years ago What types of issues is patient dealing with in the relationship?: "some problems" Does patient have children?: Yes How many children?: 2 How is patient's relationship with their children?: 73 and 22 year old, don't talk to them too often, they live with their father in [place]  Childhood History:  By whom was/is the patient raised?: Both parents Additional childhood history information: Pt reports having a pretty good childhood.  Description of patient's relationship with caregiver when they were a child: Pt reports getting along well with parents growing up. Patient's description of current relationship with people who raised him/her: Pt reports being close to her parents today.  Did patient suffer any verbal/emotional/physical/sexual abuse as a child?: No Did patient suffer from severe childhood neglect?: No Has patient ever been sexually abused/assaulted/raped as an adolescent or adult?: No Was the patient ever a victim of a crime or a disaster?: No Witnessed  domestic violence?: No Has patient been effected by domestic violence as an adult?: No  Education:  Highest grade of school patient has completed: 10th Currently a student?: No Learning disability?: No  Employment/Work Situation:  Employment situation: Disability For mental health, several years What is the longest time patient has a held a job?: 2-3 years  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 Has patient ever been in the [employer]?: No Has patient ever served in combat?: No Did You Receive Any Psychiatric Treatment/Services While in Passenger transport manager?: No Are There Guns or Other Weapons in [place]?: No  Financial Resources:  Museum/gallery curator resources: Support from parents / caregiver Does patient have a Programmer, applications or guardian?: No  Alcohol/Substance Abuse:  What has been your use of drugs/alcohol within the last 12 months?: Pt denies, but last time pt was here mother indicated she was concerned about pt drinking alcohol-beer   If attempted suicide, did drugs/alcohol play a role in this?: No Alcohol/Substance Abuse Treatment Hx: Denies past history Has alcohol/substance abuse ever caused legal problems?: No Mother indicates that pt was jailed for 6 years in [place], charges unclear.  Also states that pt was first diagnosed in [place] in 2010.  Social Support System: Patient's Community Support System: Good Describe Community Support System: Pt reports her parents are her main support.  Type of faith/religion: Pt denies How does patient's faith help to cope with current illness?: N/A  Leisure/Recreation:  Leisure and Hobbies: talk with friends     What is the patient's perception of their strengths?: Unable to answer Patient states they can use these personal strengths during their treatment to contribute to their recovery: Unable to answer Patient states these barriers may affect/interfere with their treatment: Unable to answer Patient states these  barriers  may affect their return to the community: Unable to answer Other important information patient would like considered in planning for their treatment: Unable to answer  Discharge Plan:   Currently receiving community mental health services: No Patient states concerns and preferences for aftercare planning are: [place] Patient states they will know when they are safe and ready for discharge when: Unable to answer Does patient have access to transportation?: Yes Does patient have financial barriers related to discharge medications?: No Will patient be returning to same living situation after discharge?: Yes  Summary/Recommendations:   Summary and Recommendations (to be completed by the evaluator): Tyishia is a 42 YO Hispanic Spanish speaking female diagnosed with Schizophrenia.  Her family brought her to the ED, stating she had been sick for "13 days," staying in bed, not eating, and lately not speaking. Furthermore, they report she last went to [facility] in November of 18, and stopped taking medication in February of this year.  Leeza presents with increased latency of responses, minimal eye contact, and depressed affect.  This has been her general presentation in past admissions as well, the most recent one in March of 18.  At d/c, she will return home and follow up at [facility].  While here, Carlean can benefit from crises stabilization, medication management, therapeutic milieu and referral for services.  Trish Mage. 01/01/2018

## 2018-01-01 NOTE — Progress Notes (Signed)

## 2018-01-01 NOTE — H&P (Signed)
Psychiatric Admission Assessment Adult  Patient Identification: Isabella [name] MRN:  [id number] Date of Evaluation:  01/01/2018 Chief Complaint:  Schizophrenia Principal Diagnosis: Schizophrenia ([place]) Diagnosis:   Patient Active Problem List   Diagnosis Date Noted  . Schizophrenia ([place]) [F20.9] 12/31/2017  . Social anxiety disorder [F40.10] 08/02/2016  . 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 Pt was medically cleared and then transferred to [facility] for additional treatment and stabilization.  Upon initial interview, pt is in bed and she does not comply with request to leave her bed and go to the office for interview. Interview was conducted with assistance of Spanish-Language interpreter Isabella [name]). Pt has increased latency of responses, minimal eye contact, and depressed affect. She appears internally preoccupied and is only able to offer minimal, and seemingly forced responses of only a few words. She is generally a poor historian and cannot answers questions such as ages of her children. Pt endorses depressed mood, guilty feelings, anhedonia, and poor concentration for duration of "a long time." Pt is unable to be more specific. She denies SI/HI/AH/VH. She reports she is sleeping for most of the day, and she only leaves bed a few times during the day. She denies symptoms of mania, OCD, and PTSD. She denies illicit substance use.  Attempted to discuss with patient about treatment options. She reports that she takes medications but she cannot recall their names, and she does not know who is her outpatient provider. She lives  with her parents whom help her manage her medications. As per chart review, pt has previous trials of latuda, zyprexa, klonopin, effexor, vistaril, trazodone, and cogentin. Given pt's neurovegetative symptoms in combination with thought blocking and increased negative psychotic symptoms,  we will start combination of abilify and lexapro. Pt was in agreement to be started on the above medications. She had no further questions, comments, or concerns. She was in agreement to allow SW team to gather additional collateral information from her parents.   Associated Signs/Symptoms: Depression Symptoms:  depressed mood, anhedonia, hypersomnia, psychomotor retardation, fatigue, feelings of worthlessness/guilt, difficulty concentrating, hopelessness, anxiety, loss of energy/fatigue, (Hypo) Manic Symptoms:  NA Anxiety Symptoms:  Excessive Worry, Psychotic Symptoms:  negative neurovegetative symptoms, catatonic-like amotivation PTSD Symptoms: NA Total Time spent with patient: 1 hour  Past Psychiatric History:  - previous dx of schizophrenia  - previous inpatient to [facility] in 2018 - no current outpatient provider as per patient - denies hx of suicide attempt  Is the patient at risk to self? Yes.    Has the patient been a risk to self in the past 6 months? Yes.    Has the patient been a risk to self within the distant past? Yes.    Is the patient a risk to others? No.  Has the patient been a risk to others in the past 6 months? No.  Has the patient been a risk to others within the distant past? No.   Prior Inpatient Therapy:   Prior Outpatient Therapy:    Alcohol Screening: Patient refused Alcohol Screening Tool: Yes 1. How often do you have a drink containing alcohol?: Never 2. How many drinks containing alcohol do you have on  a typical day when you are drinking?: 1 or 2 3. How often do you have six or more drinks on one occasion?: Never AUDIT-C Score: 0 Intervention/Follow-up: AUDIT Score  <7 follow-up not indicated Substance Abuse History in the last 12 months:  No. Consequences of Substance Abuse: NA Previous Psychotropic Medications: Yes  Psychological Evaluations: Yes  Past Medical History:  Past Medical History:  Diagnosis Date  . Bipolar disorder ([place])   . Hypertension   . Migraine 07/13/2016  . Schizophrenia ([place])    History reviewed. No pertinent surgical history. Family History:  Family History  Problem Relation Age of Onset  . Mental illness Neg Hx    Family Psychiatric  History: denies family psychiatric history. Tobacco Screening:   Social History: Pt was born in [place]. She lives in [place] with her parents. She is not working. She has two children whom live with their father. She denies legal history. Social History   Substance and Sexual Activity  Alcohol Use No     Social History   Substance and Sexual Activity  Drug Use No    Additional Social History:                           Allergies:  No Known Allergies Lab Results: No results found for this or any previous visit (from the past 48 hour(s)).  Blood Alcohol level:  Lab Results  Component Value Date   ETH <10 12/29/2017   ETH <5 09/32/6712    Metabolic Disorder Labs:  Lab Results  Component Value Date   HGBA1C 5.4 01/31/2016   MPG 108 01/31/2016   Lab Results  Component Value Date   PROLACTIN 124.2 (H) 01/31/2016   Lab Results  Component Value Date   CHOL 152 01/31/2016   TRIG 147 01/31/2016   HDL 39 (L) 01/31/2016   CHOLHDL 3.9 01/31/2016   VLDL 29 01/31/2016   LDLCALC 84 01/31/2016    Current Medications: Current Facility-Administered Medications  Medication Dose Route Frequency Provider Last Rate Last Dose  . acetaminophen (TYLENOL) tablet 650 mg  650 mg Oral Q6H PRN Money, [name], FNP      . alum & mag hydroxide-simeth (MAALOX/MYLANTA) 200-200-20 MG/5ML suspension 30 mL  30 mL Oral Q4H PRN Money, [name], FNP      . ARIPiprazole (ABILIFY)  tablet 15 mg  15 mg Oral Daily Maris Berger T, MD      . escitalopram (LEXAPRO) tablet 10 mg  10 mg Oral Daily [name], MD      . hydrOXYzine (ATARAX/VISTARIL) tablet 50 mg  50 mg Oral Q6H PRN [name], MD      . magnesium hydroxide (MILK OF MAGNESIA) suspension 30 mL  30 mL Oral Daily PRN Money, [name], FNP      . OLANZapine zydis (ZYPREXA) disintegrating tablet 5 mg  5 mg Oral Q8H PRN [name], MD       Or  . ziprasidone (GEODON) injection 20 mg  20 mg Intramuscular Q12H PRN [name], MD      . traZODone (DESYREL) tablet 50 mg  50 mg Oral QHS PRN Money, [name], FNP       PTA Medications: Medications Prior to Admission  Medication Sig Dispense Refill Last Dose  . benztropine (COGENTIN) 0.5 MG tablet Take 1 tablet (0.5 mg total) by mouth at bedtime. (Patient not taking: Reported on 12/30/2017) 30 tablet 0 Not Taking at  Unknown time  . clonazePAM (KLONOPIN) 0.5 MG tablet Take 0.5 tablets (0.25 mg total) by mouth 2 (two) times daily. Please instruct patient to cut the tablet in half. (Patient not taking: Reported on 12/30/2017) 30 tablet 0 Not Taking at Unknown time  . doxepin (SINEQUAN) 25 MG capsule Take 1 capsule (25 mg total) by mouth at bedtime. (Patient not taking: Reported on 12/30/2017) 30 capsule 0 Not Taking at Unknown time  . hydrOXYzine (ATARAX/VISTARIL) 25 MG tablet Take 1 tablet (25 mg total) by mouth every 6 (six) hours as needed for anxiety. (Patient not taking: Reported on 12/30/2017) 30 tablet 0 Not Taking at Unknown time  . hyoscyamine (LEVSIN SL) 0.125 MG SL tablet Place 0.125 mg under the tongue every 4 (four) hours as needed for cramping. For 10 days  0 12/26/2017  . losartan (COZAAR) 25 MG tablet Take 1 tablet (25 mg total) by mouth daily. (Patient not taking: Reported on 12/30/2017) 30 tablet 0 Not Taking at Unknown time  . megestrol (MEGACE) 400 MG/10ML suspension Take 10 mLs (400 mg total) by mouth 2  (two) times daily. (Patient not taking: Reported on 12/30/2017) 240 mL 0 Not Taking at Unknown time  . OLANZapine (ZYPREXA) 2.5 MG tablet Take 5 tablets (12.5 mg total) by mouth at bedtime. (Patient not taking: Reported on 12/30/2017) 150 tablet 0 Not Taking at Unknown time  . propranolol (INDERAL) 10 MG tablet Take 1 tablet (10 mg total) by mouth 2 (two) times daily. (Patient not taking: Reported on 12/30/2017) 60 tablet 0 Not Taking at Unknown time  . traZODone (DESYREL) 50 MG tablet Take 1 tablet (50 mg total) by mouth at bedtime as needed for sleep. (Patient not taking: Reported on 12/30/2017) 30 tablet 0 Not Taking at Unknown time  . venlafaxine XR (EFFEXOR-XR) 75 MG 24 hr capsule Take 3 capsules (225 mg total) by mouth daily with breakfast. (Patient not taking: Reported on 12/30/2017) 90 capsule 0 Not Taking at Unknown time    Musculoskeletal: Strength & Muscle Tone: within normal limits Gait & Station: normal Patient leans: N/A  Psychiatric Specialty Exam: Physical Exam  Nursing note and vitals reviewed.   Review of Systems  Constitutional: Negative for chills and fever.  Respiratory: Negative for cough and shortness of breath.   Cardiovascular: Negative for chest pain.  Gastrointestinal: Negative for abdominal pain, heartburn, nausea and vomiting.  Psychiatric/Behavioral: Negative for depression, hallucinations and suicidal ideas. The patient is not nervous/anxious and does not have insomnia.     Blood pressure 101/62, pulse 69, temperature 98.4 F (36.9 C), temperature source Oral, resp. rate 16, height 5\' 3"  (1.6 m), weight 83.9 kg, SpO2 99 %.Body mass index is 32.77 kg/m.  General Appearance: Casual and Fairly Groomed  Eye Contact:  Minimal  Speech:  Blocked, Garbled and Slow  Volume:  Decreased  Mood:  Anxious and Depressed  Affect:  Congruent, Depressed and Flat  Thought Process:  Coherent and Goal Directed  Orientation:  Full (Time, Place, and Person)  Thought Content:   Logical  Suicidal Thoughts:  No  Homicidal Thoughts:  No  Memory:  Immediate;   Fair Recent;   Fair Remote;   Poor  Judgement:  Poor  Insight:  Lacking  Psychomotor Activity:  Psychomotor Retardation  Concentration:  Concentration: Fair  Recall:  [employer] of Knowledge:  Fair  Language:  Fair  Akathisia:  No  Handed:    AIMS (if indicated):     Assets:  Resilience Social Support  ADL's:  Intact  Cognition:  WNL  Sleep:  Number of Hours: 6.75   Treatment Plan Summary: Daily contact with patient to assess and evaluate symptoms and progress in treatment and Medication management  Observation Level/Precautions:  15 minute checks  Laboratory:  CBC Chemistry Profile HbAIC HCG UDS UA  Psychotherapy:  Encourage participation in groups and therapeutic milieu   Medications:  Start lexaparo 10mg  po qDay. Start abilify 15mg  po qDay. Continue other current PRN orders without changes.    Consultations:    Discharge Concerns:    Estimated LOS: 5-7 days  Other:     Physician Treatment Plan for Primary Diagnosis: Schizophrenia ([place]) Long Term Goal(s): Improvement in symptoms so as ready for discharge  Short Term Goals: Ability to identify and develop effective coping behaviors will improve  Physician Treatment Plan for Secondary Diagnosis: Principal Problem:   Schizophrenia ([place])  Long Term Goal(s): Improvement in symptoms so as ready for discharge  Short Term Goals: Ability to demonstrate self-control will improve  I certify that inpatient services furnished can reasonably be expected to improve the patient's condition.    [name], MD 8/19/201912:52 PM

## 2018-01-01 NOTE — BHH Group Notes (Signed)

## 2018-01-01 NOTE — Progress Notes (Signed)
Did not attend group 

## 2018-01-01 NOTE — Plan of Care (Signed)
Problem: Safety: Goal: Periods of time without injury will increase Outcome: Progressing D: Pt asleep in bed at this time. Stayed in room majority of this shift. Remains selectively mute. Assessment done via Spanish interpreter. Pt presents with flat affect and depressed mood. Isolative to room throughout this shift. Denies SI, HI, VH and pain. Reports VH but will not elaborate on it. Selectively mute on interactions. Pt refused her medications when offered on multiple attempts. Pt did not attend groups, or do her ADLS despite multiple prompts / encouragement. A: Scheduled medications offered but to no avail.  Emotional support and availability offered to pt throughout this shift. Encouraged pt to voice concerns and comply with treatment regimen including groups and unit activities. Q 15 minutes safety checks maintained without incident thus far.  R: Pt required increased prompts to drink. Refused meals when offered. POC continues for safety and mood stability.

## 2018-01-01 NOTE — Tx Team (Signed)
Interdisciplinary Treatment and Diagnostic Plan Update  01/01/2018 Time of Session: 10:59 AM  Isabella [name] MRN: [id number]  Principal Diagnosis: Schizophrenia ([place])  Secondary Diagnoses: Active Problems:   Schizophrenia ([place])   Current Medications:  Current Facility-Administered Medications  Medication Dose Route Frequency Provider Last Rate Last Dose  . acetaminophen (TYLENOL) tablet 650 mg  650 mg Oral Q6H PRN Money, [name], FNP      . alum & mag hydroxide-simeth (MAALOX/MYLANTA) 200-200-20 MG/5ML suspension 30 mL  30 mL Oral Q4H PRN Money, [name], FNP      . hydrOXYzine (ATARAX/VISTARIL) tablet 25 mg  25 mg Oral TID PRN Money, [name], FNP      . magnesium hydroxide (MILK OF MAGNESIA) suspension 30 mL  30 mL Oral Daily PRN Money, [name], FNP      . traZODone (DESYREL) tablet 50 mg  50 mg Oral QHS PRN Money, [name], FNP        PTA Medications: Medications Prior to Admission  Medication Sig Dispense Refill Last Dose  . benztropine (COGENTIN) 0.5 MG tablet Take 1 tablet (0.5 mg total) by mouth at bedtime. (Patient not taking: Reported on 12/30/2017) 30 tablet 0 Not Taking at Unknown time  . clonazePAM (KLONOPIN) 0.5 MG tablet Take 0.5 tablets (0.25 mg total) by mouth 2 (two) times daily. Please instruct patient to cut the tablet in half. (Patient not taking: Reported on 12/30/2017) 30 tablet 0 Not Taking at Unknown time  . doxepin (SINEQUAN) 25 MG capsule Take 1 capsule (25 mg total) by mouth at bedtime. (Patient not taking: Reported on 12/30/2017) 30 capsule 0 Not Taking at Unknown time  . hydrOXYzine (ATARAX/VISTARIL) 25 MG tablet Take 1 tablet (25 mg total) by mouth every 6 (six) hours as needed for anxiety. (Patient not taking: Reported on 12/30/2017) 30 tablet 0 Not Taking at Unknown time  . hyoscyamine (LEVSIN SL) 0.125 MG SL tablet Place 0.125 mg under the tongue every 4 (four) hours as needed for cramping. For 10 days  0 12/26/2017  . losartan (COZAAR) 25 MG tablet Take 1  tablet (25 mg total) by mouth daily. (Patient not taking: Reported on 12/30/2017) 30 tablet 0 Not Taking at Unknown time  . megestrol (MEGACE) 400 MG/10ML suspension Take 10 mLs (400 mg total) by mouth 2 (two) times daily. (Patient not taking: Reported on 12/30/2017) 240 mL 0 Not Taking at Unknown time  . OLANZapine (ZYPREXA) 2.5 MG tablet Take 5 tablets (12.5 mg total) by mouth at bedtime. (Patient not taking: Reported on 12/30/2017) 150 tablet 0 Not Taking at Unknown time  . propranolol (INDERAL) 10 MG tablet Take 1 tablet (10 mg total) by mouth 2 (two) times daily. (Patient not taking: Reported on 12/30/2017) 60 tablet 0 Not Taking at Unknown time  . traZODone (DESYREL) 50 MG tablet Take 1 tablet (50 mg total) by mouth at bedtime as needed for sleep. (Patient not taking: Reported on 12/30/2017) 30 tablet 0 Not Taking at Unknown time  . venlafaxine XR (EFFEXOR-XR) 75 MG 24 hr capsule Take 3 capsules (225 mg total) by mouth daily with breakfast. (Patient not taking: Reported on 12/30/2017) 90 capsule 0 Not Taking at Unknown time    Patient Stressors: Other: mental health problems  Patient Strengths: Average or above average intelligence Physical Health Supportive family/friends  Treatment Modalities: Medication Management, Group therapy, Case management,  1 to 1 session with clinician, Psychoeducation, Recreational therapy.   Physician Treatment Plan for Primary Diagnosis: Schizophrenia [facility]) Long Term Goal(s):  Improvement in symptoms so as ready for discharge  Short Term Goals:    Medication Management: Evaluate patient's response, side effects, and tolerance of medication regimen.  Therapeutic Interventions: 1 to 1 sessions, Unit Group sessions and Medication administration.  Evaluation of Outcomes: Progressing  Physician Treatment Plan for Secondary Diagnosis: Active Problems:   Schizophrenia ([place])   Long Term Goal(s): Improvement in symptoms so as ready for discharge  Short Term  Goals:    Medication Management: Evaluate patient's response, side effects, and tolerance of medication regimen.  Therapeutic Interventions: 1 to 1 sessions, Unit Group sessions and Medication administration.  Evaluation of Outcomes: Progressing   RN Treatment Plan for Primary Diagnosis: Schizophrenia ([place]) Long Term Goal(s): Knowledge of disease and therapeutic regimen to maintain health will improve  Short Term Goals: Ability to identify and develop effective coping behaviors will improve and Compliance with prescribed medications will improve  Medication Management: RN will administer medications as ordered by provider, will assess and evaluate patient's response and provide education to patient for prescribed medication. RN will report any adverse and/or side effects to prescribing provider.  Therapeutic Interventions: 1 on 1 counseling sessions, Psychoeducation, Medication administration, Evaluate responses to treatment, Monitor vital signs and CBGs as ordered, Perform/monitor CIWA, COWS, AIMS and Fall Risk screenings as ordered, Perform wound care treatments as ordered.  Evaluation of Outcomes: Progressing   LCSW Treatment Plan for Primary Diagnosis: Schizophrenia ([place]) Long Term Goal(s): Safe transition to appropriate next level of care at discharge, Engage patient in therapeutic group addressing interpersonal concerns.  Short Term Goals: Engage patient in aftercare planning with referrals and resources  Therapeutic Interventions: Assess for all discharge needs, 1 to 1 time with Social worker, Explore available resources and support systems, Assess for adequacy in community support network, Educate family and significant other(s) on suicide prevention, Complete Psychosocial Assessment, Interpersonal group therapy.  Evaluation of Outcomes: Met  Return home, follow up [place] in Treatment: Attending groups: No Participating in groups: No Taking medication as  prescribed: Yes Toleration medication: Yes, no side effects reported at this time Family/Significant other contact made: No Patient understands diagnosis: No Limited insight Discussing patient identified problems/goals with staff: Yes Medical problems stabilized or resolved: Yes Denies suicidal/homicidal ideation: Yes Issues/concerns per patient self-inventory: None Other: N/A  New problem(s) identified: None identified at this time.   New Short Term/Long Term Goal(s): "I feel sad, and I sleep too much."  Discharge Plan or Barriers:   Reason for Continuation of Hospitalization:  Delusions  Depression  Medication stabilization Suicidal ideation   Estimated Length of Stay: 8/23  Attendees: Patient: Isabella [name] 01/01/2018  10:59 AM  Physician: Maris Berger, MD 01/01/2018  10:59 AM  Nursing: Sena Hitch, RN 01/01/2018  10:59 AM  RN Care Manager: Lars Pinks, RN 01/01/2018  10:59 AM  Social Worker: Ripley Fraise 01/01/2018  10:59 AM  Recreational Therapist: Winfield Cunas 01/01/2018  10:59 AM  Other: Norberto Sorenson 01/01/2018  10:59 AM  Other:  01/01/2018  10:59 AM    Scribe for Treatment Team:  Roque Lias LCSW 01/01/2018 10:59 AM

## 2018-01-02 NOTE — Progress Notes (Signed)
[facility] MD Progress Note  01/02/2018 11:49 AM Isabella [name]  MRN:  [id number] Subjective:    Isabella [name] is a 42 y/o F with history of schizophrenia who was admitted voluntarily from [place] where she presented with depression, neurovegetative symptoms, not eating/drinking, not caring for herself, and medication non-compliant for the past 2 weeks with possible trigger associated with death of pt's uncle 2 weeks ago. Pt was medically cleared and then transferred to [facility] for additional treatment and stabilization. She was started on trial of abilify and lexapro.   Today upon evaluation, pt was interviewed in her room with assistance of Spanish language interpreter Willette Pa Nandin). Pt is in bed, and she again has poor eye contact, decreased volume of speech, and increased latency of responses; however, her affect appears mildly improved compared to initial intake encounter. Pt shares, "I'm okay." She continues to endorse significant depression and anxiety. She denies SI/HI/AH/VH. She is sleeping well. Her appetite is fair. She is tolerating her medications well. Her only physical complaint is decreased energy. She was able to attend one group today, and she was encouraged to go to more groups. She confirms that she has had no outpatient follow up since November 2018 with [facility] and she last took medications in February 2019. She is in agreement to continue on her current regimen without changes. She had no further questions, comments, or concerns.  Principal Problem: Schizophrenia ([place]) Diagnosis:   Patient Active Problem List   Diagnosis Date Noted  . Schizophrenia ([place]) [F20.9] 12/31/2017  . Social anxiety disorder [F40.10] 08/02/2016  . 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 Bipolar disorder ([place])   . Hypertension   . Migraine  07/13/2016  . Schizophrenia ([place])    History reviewed. No pertinent surgical history. Family History:  Family History  Problem Relation Age of Onset  . Mental illness Neg Hx    Family Psychiatric  History: see H&P Social History:  Social History   Substance and Sexual Activity  Alcohol Use No     Social History   Substance and Sexual Activity  Drug Use No    Social History   Socioeconomic History  . Marital status: Single    Spouse name: Not on file  . Number of children: Not on file  . Years of education: Not on file  . Highest education level: Not on file  Occupational History  . Not on file  Social Needs  . Financial resource strain: Not on file  . Food insecurity:    Worry: Not on file    Inability: Not on file  . Transportation needs:    Medical: Not on file    Non-medical: Not on file  Tobacco Use  . Smoking status: Smoker, Current Status Unknown  . Smokeless tobacco: Never Used  Substance and Sexual Activity  . Alcohol use: No  . Drug use: No  . Sexual activity: Never  Lifestyle  . Physical activity:    Days per week: Not on file    Minutes per session: Not on file  . Stress: Not on file  Relationships  . Social connections:    Talks on phone: Not on file    Gets together: Not on file    Attends religious service: Not on file    Active member of club or organization: Not on file  Attends meetings of clubs or organizations: Not on file    Relationship status: Not on file  Other Topics Concern  . Not on file  Social History Narrative  . Not on file   Additional Social History:                         Sleep: Good  Appetite:  Fair  Current Medications: Current Facility-Administered Medications  Medication Dose Route Frequency Provider Last Rate Last Dose  . acetaminophen (TYLENOL) tablet 650 mg  650 mg Oral Q6H PRN Money, [name], FNP      . alum & mag hydroxide-simeth (MAALOX/MYLANTA) 200-200-20 MG/5ML suspension 30 mL  30 mL  Oral Q4H PRN Money, [name], FNP      . ARIPiprazole (ABILIFY) tablet 15 mg  15 mg Oral Daily [name], MD   15 mg at 01/02/18 0814  . escitalopram (LEXAPRO) tablet 10 mg  10 mg Oral Daily [name], MD   10 mg at 01/02/18 0815  . hydrOXYzine (ATARAX/VISTARIL) tablet 50 mg  50 mg Oral Q6H PRN [name], MD      . magnesium hydroxide (MILK OF MAGNESIA) suspension 30 mL  30 mL Oral Daily PRN Money, [name], FNP      . OLANZapine zydis (ZYPREXA) disintegrating tablet 5 mg  5 mg Oral Q8H PRN [name], MD       Or  . ziprasidone (GEODON) injection 20 mg  20 mg Intramuscular Q12H PRN [name], MD      . traZODone (DESYREL) tablet 50 mg  50 mg Oral QHS PRN Money, [name], FNP        Lab Results: No results found for this or any previous visit (from the past 48 hour(s)).  Blood Alcohol level:  Lab Results  Component Value Date   ETH <10 12/29/2017   ETH <5 66/10/3014    Metabolic Disorder Labs: Lab Results  Component Value Date   HGBA1C 5.4 01/31/2016   MPG 108 01/31/2016   Lab Results  Component Value Date   PROLACTIN 124.2 (H) 01/31/2016   Lab Results  Component Value Date   CHOL 152 01/31/2016   TRIG 147 01/31/2016   HDL 39 (L) 01/31/2016   CHOLHDL 3.9 01/31/2016   VLDL 29 01/31/2016   LDLCALC 84 01/31/2016    Physical Findings: AIMS: Facial and Oral Movements Muscles of Facial Expression: None, normal Lips and Perioral Area: None, normal Jaw: None, normal Tongue: None, normal,Extremity Movements Upper (arms, wrists, hands, fingers): None, normal Lower (legs, knees, ankles, toes): None, normal, Trunk Movements Neck, shoulders, hips: None, normal, Overall Severity Severity of abnormal movements (highest score from questions above): None, normal Incapacitation due to abnormal movements: None, normal Patient's awareness of abnormal movements (rate only patient's report): No Awareness, Dental  Status Current problems with teeth and/or dentures?: No Does patient usually wear dentures?: No  CIWA:    COWS:     Musculoskeletal: Strength & Muscle Tone: within normal limits Gait & Station: normal Patient leans: N/A  Psychiatric Specialty Exam: Physical Exam  Nursing note and vitals reviewed.   Review of Systems  Constitutional: Positive for malaise/fatigue. Negative for chills and fever.  Respiratory: Negative for cough and shortness of breath.   Cardiovascular: Negative for chest pain.  Gastrointestinal: Negative for abdominal pain, heartburn, nausea and vomiting.  Psychiatric/Behavioral: Positive for depression. Negative for hallucinations and suicidal ideas. The patient is not nervous/anxious and  does not have insomnia.     Blood pressure 129/89, pulse (!) 120, temperature 98.8 F (37.1 C), temperature source Oral, resp. rate 16, height 5\' 3"  (1.6 m), weight 83.9 kg, SpO2 96 %.Body mass index is 32.77 kg/m.  General Appearance: Casual and Fairly Groomed  Eye Contact:  Minimal  Speech:  Slow  Volume:  Decreased  Mood:  Anxious and Depressed  Affect:  Appropriate, Congruent, Depressed and Flat  Thought Process:  Coherent and Goal Directed  Orientation:  Full (Time, Place, and Person)  Thought Content:  Logical  Suicidal Thoughts:  No  Homicidal Thoughts:  No  Memory:  Immediate;   Fair Recent;   Fair Remote;   Fair  Judgement:  Poor  Insight:  Lacking  Psychomotor Activity:  Normal  Concentration:  Concentration: Fair  Recall:  [employer] of Knowledge:  Fair  Language:  Fair  Akathisia:  No  Handed:    AIMS (if indicated):     Assets:  Resilience Social Support  ADL's:  Intact  Cognition:  WNL  Sleep:  Number of Hours: 6.75   Treatment Plan Summary: Daily contact with patient to assess and evaluate symptoms and progress in treatment and Medication management   -Continue inpatient hospitalization  -Schizophrenia 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

## 2018-01-02 NOTE — Plan of Care (Signed)
  Problem: Activity: Goal: Interest or engagement in activities will improve Outcome: Not Progressing-would not get out of bed for evening wrap up group.

## 2018-01-02 NOTE — BHH Group Notes (Signed)
LCSW Group Therapy Note   01/02/2018 1:15pm   Type of Therapy and Topic:  Group Therapy:  Positive Affirmations   Participation Level:  None  Description of Group: This group addressed positive affirmation toward self and others. Patients went around the room and identified two positive things about themselves and two positive things about a peer in the room. Patients reflected on how it felt to share something positive with others, to identify positive things about themselves, and to hear positive things from others. Patients were encouraged to have a daily reflection of positive characteristics or circumstances.  Therapeutic Goals 1. Patient will verbalize two of their positive qualities 2. Patient will demonstrate empathy for others by stating two positive qualities about a peer in the group 3. Patient will verbalize their feelings when voicing positive self affirmations and when voicing positive affirmations of others 4. Patients will discuss the potential positive impact on their wellness/recovery of focusing on positive traits of self and others. Summary of Patient Progress:  Came intially.  Was called out to see Dr and did not return.  Therapeutic Modalities Cognitive Behavioral Therapy Motivational Interviewing  Trish Mage, [place] 01/02/2018 3:39 PM

## 2018-01-02 NOTE — Progress Notes (Signed)
Recreation Therapy Notes  Date: 8.20.19 Time: 1000 Location: 500 Hall Dayroom  Group Topic: Wellness  Goal Area(s) Addresses:  Patient will define components of whole wellness. Patient will verbalize benefit of whole wellness.  Behavioral Response: None  Intervention: Music  Activity: Exercise.  LRT lead patients in a series of stretches before starting the exercises.  Once stretched, each patient was given the opportunity to lead the group in an exercise.  The group completed a series of 3 rounds of exercises.  Education: Wellness, Dentist.   Education Outcome: Acknowledges education/In group clarification offered/Needs additional education.   Clinical Observations/Feedback: Pt arrived late.  Pt did not participate in exercises even with encouragement from LRT.    Victorino Sparrow, LRT/CTRS    Victorino Sparrow A 01/02/2018 11:42 AM

## 2018-01-02 NOTE — Progress Notes (Signed)
Patient ID: Isabella [name], female   DOB: January 03, 1976, 42 y.o.   MRN: [id number] D: Patient with blunted affect, mood depressed, did not answer to questions regarding AVH, but denied SI/HI, and did not seem to be responding to any internal stimuli.  Pt sat quietly in the day room after lunch with limited interactions with her peers.  Language line interpreter was available for translations.  A: Patient being given all of her medications as scheduled, and Q15 minute checks are being maintained for safety.  R: Q15 minute checks in place, will continue to monitor.

## 2018-01-02 NOTE — Progress Notes (Signed)
D:  Isabella [name] has been in her room all shift.  She had visitors during visiting hours.  Minimal interaction with staff or peers. She was selectively mute this evening.  No medications given this shift.  She did not attend evening wrap up group.  No SI/HI voiced.  She is currently resting with her eyes closed and appears to be asleep. A:  1:1 with RN for support and encouragement.  Medications as ordered.  Q 15 minute checks maintained for safety.  Encouraged participation in group and unit activities.   R:  Isabella [name] remains safe on the unit.  We will continue to monitor the progress towards her goals.

## 2018-01-02 NOTE — B[facility] Suicide Risk Assessment (Signed)
[place] INPATIENT:  Family/Significant Other Suicide Prevention Education  Suicide Prevention Education:  Education Completed; No one has been identified by the patient as the family member/significant other with whom the patient will be residing, and identified as the person(s) who will aid the patient in the event of a mental health crisis (suicidal ideations/suicide attempt).  With written consent from the patient, the family member/significant other has been provided the following suicide prevention education, prior to the and/or following the discharge of the patient.  The suicide prevention education provided includes the following:  Suicide risk factors  Suicide prevention and interventions  National Suicide Hotline telephone number  [facility] assessment telephone number  [facility] Emergency Assistance [place] and/or Residential Mobile Crisis Unit telephone number  Request made of family/significant other to:  Remove weapons (e.g., guns, rifles, knives), all items previously/currently identified as safety concern.    Remove drugs/medications (over-the-counter, prescriptions, illicit drugs), all items previously/currently identified as a safety concern.  The family member/significant other verbalizes understanding of the suicide prevention education information provided.  The family member/significant other agrees to remove the items of safety concern listed above. The patient did not endorse SI at the time of admission, nor did the patient c/o SI during the stay here.  SPE not required. However, I did talk to Toni Arthurs [phone], who speaks English, and we went over treatment team recommendations and crises plan.  Emboly states plan is for patient to return home, and that family was trying to get patient to continue to go to [facility] and take medication, but pt refused.    [place] 01/02/2018, 3:46 PM

## 2018-01-03 DIAGNOSIS — F200 Paranoid schizophrenia: Secondary | ICD-10-CM

## 2018-01-03 MED ORDER — HYOSCYAMINE SULFATE 0.125 MG PO TBDP
0.1250 mg | ORAL_TABLET | ORAL | Status: DC | PRN
Start: 2018-01-03 — End: 2018-01-10
  Administered 2018-01-04: 0.125 mg via ORAL
  Filled 2018-01-03 (×2): qty 1

## 2018-01-03 NOTE — Progress Notes (Signed)
Patient denies SI, HI and AVH this shift.  Patient was compliant with medications and had no incidents of behavioral dyscontrol.  Patient attended groups and engaged in unit activities.   Assess patient for safety, offer medications as prescribed, engage patient in 1:1 staff talks.   Patient able to contract for safety. Continue to monitor as planned.  

## 2018-01-03 NOTE — Progress Notes (Signed)
[facility] MD Progress Note  01/03/2018 2:10 PM Brandie Lopes  MRN:  [id number]  Subjective: Marixa reports in a whispering voice, "I have been feeling depressed for a long time & I'm still feeling very depressed. I don't know why I feel so depressed"   Kiyanna Biegler is a 42 y/o F with history of schizophrenia who was admitted voluntarily from [facility] where she presented with depression, neurovegetative symptoms, not eating/drinking, not caring for herself, and medication non-compliant for the past 2 weeks with possible trigger associated with death of pt's uncle 2 weeks ago. Pt was medically cleared and then transferred to [facility] for additional treatment and stabilization. She was started on trial of abilify and lexapro.   Today upon evaluation, pt was interviewed in her room with no assistance of a Spanish language interpreter. She is able to make herself & needs understood during this assessment. She speaks in a very low whispering voice with decreased volume. She present quite, alert with fair eye contacts. She continues to endorse significant symptoms of depression and anxiety. She says she does not know why she feels so depressed. She denies SI/HI/AH/VH. She is sleeping well. Her appetite is fair. She is tolerating her medications well. She was able to attend one group yesterday but none today. She is being encouraged to come out of her room & attend more group sessions. She confirmed yesterday during a follow-up care interview with the attending psychiatrist that she has had no outpatient follow up since November 2018 with [facility] and she last took medications in February 2019. She is in agreement to continue on her current regimen without changes. She had no further questions, comments, or concerns.  Principal Problem: Schizophrenia ([place])  Diagnosis:   Patient Active Problem List   Diagnosis Date Noted  . Schizophrenia ([place]) [F20.9] 12/31/2017  . Social anxiety disorder [F40.10] 08/02/2016  .  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 Bipolar disorder ([place])   . Hypertension   . Migraine 07/13/2016  . Schizophrenia ([place])    History reviewed. No pertinent surgical history. Family History:  Family History  Problem Relation Age of Onset  . Mental illness Neg Hx    Family Psychiatric  History: see H&P Social History:  Social History   Substance and Sexual Activity  Alcohol Use No     Social History   Substance and Sexual Activity  Drug Use No    Social History   Socioeconomic History  . Marital status: Single    Spouse name: Not on file  . Number of children: Not on file  . Years of education: Not on file  . Highest education level: Not on file  Occupational History  . Not on file  Social Needs  . Financial resource strain: Not on file  . Food insecurity:    Worry: Not on file    Inability: Not on file  . Transportation needs:    Medical: Not on file    Non-medical: Not on file  Tobacco Use  . Smoking status: Smoker, Current Status Unknown  . Smokeless tobacco: Never Used  Substance and Sexual Activity  . Alcohol use: No  . Drug use: No  . Sexual activity: Never  Lifestyle  . Physical activity:    Days per week: Not on file    Minutes per session: Not on file  .  Stress: Not on file  Relationships  . Social connections:    Talks on phone: Not on file    Gets together: Not on file    Attends religious service: Not on file    Active member of club or organization: Not on file    Attends meetings of clubs or organizations: Not on file    Relationship status: Not on file  Other Topics Concern  . Not on file  Social History Narrative  . Not on file   Additional Social History:   Sleep: Good  Appetite:  Fair  Current Medications: Current Facility-Administered Medications  Medication  Dose Route Frequency Provider Last Rate Last Dose  . acetaminophen (TYLENOL) tablet 650 mg  650 mg Oral Q6H PRN Money, [name], FNP      . alum & mag hydroxide-simeth (MAALOX/MYLANTA) 200-200-20 MG/5ML suspension 30 mL  30 mL Oral Q4H PRN Money, [name], FNP      . ARIPiprazole (ABILIFY) tablet 15 mg  15 mg Oral Daily [name], MD   15 mg at 01/03/18 0920  . escitalopram (LEXAPRO) tablet 10 mg  10 mg Oral Daily [name], MD   10 mg at 01/03/18 0920  . hydrOXYzine (ATARAX/VISTARIL) tablet 50 mg  50 mg Oral Q6H PRN [name], MD      . hyoscyamine (ANASPAZ) disintergrating tablet 0.125 mg  0.125 mg Oral Q4H PRN [name], MD      . magnesium hydroxide (MILK OF MAGNESIA) suspension 30 mL  30 mL Oral Daily PRN Money, [name], FNP      . OLANZapine zydis (ZYPREXA) disintegrating tablet 5 mg  5 mg Oral Q8H PRN [name], MD       Or  . ziprasidone (GEODON) injection 20 mg  20 mg Intramuscular Q12H PRN [name], MD      . traZODone (DESYREL) tablet 50 mg  50 mg Oral QHS PRN Money, [name], FNP        Lab Results: No results found for this or any previous visit (from the past 48 hour(s)).  Blood Alcohol level:  Lab Results  Component Value Date   ETH <10 12/29/2017   ETH <5 24/58/0998   Metabolic Disorder Labs: Lab Results  Component Value Date   HGBA1C 5.4 01/31/2016   MPG 108 01/31/2016   Lab Results  Component Value Date   PROLACTIN 124.2 (H) 01/31/2016   Lab Results  Component Value Date   CHOL 152 01/31/2016   TRIG 147 01/31/2016   HDL 39 (L) 01/31/2016   CHOLHDL 3.9 01/31/2016   VLDL 29 01/31/2016   LDLCALC 84 01/31/2016   Physical Findings: AIMS: Facial and Oral Movements Muscles of Facial Expression: None, normal Lips and Perioral Area: None, normal Jaw: None, normal Tongue: None, normal,Extremity Movements Upper (arms, wrists, hands, fingers): None, normal Lower (legs,  knees, ankles, toes): None, normal, Trunk Movements Neck, shoulders, hips: None, normal, Overall Severity Severity of abnormal movements (highest score from questions above): None, normal Incapacitation due to abnormal movements: None, normal Patient's awareness of abnormal movements (rate only patient's report): No Awareness, Dental Status Current problems with teeth and/or dentures?: No Does patient usually wear dentures?: No  CIWA:    COWS:     Musculoskeletal: Strength & Muscle Tone: within normal limits Gait & Station: normal Patient leans: N/A  Psychiatric Specialty Exam: Physical Exam  Nursing note and vitals reviewed.   Review of Systems  Constitutional: Positive for  malaise/fatigue. Negative for chills and fever.  Respiratory: Negative for cough and shortness of breath.   Cardiovascular: Negative for chest pain.  Gastrointestinal: Negative for abdominal pain, heartburn, nausea and vomiting.  Psychiatric/Behavioral: Positive for depression. Negative for hallucinations and suicidal ideas. The patient is not nervous/anxious and does not have insomnia.     Blood pressure 108/63, pulse 75, temperature 98.8 F (37.1 C), temperature source Oral, resp. rate 16, height 5\' 3"  (1.6 m), weight 83.9 kg, SpO2 96 %.Body mass index is 32.77 kg/m.  General Appearance: Casual and Fairly Groomed  Eye Contact:  Minimal  Speech:  Slow  Volume:  Decreased  Mood:  Anxious and Depressed  Affect:  Appropriate, Congruent, Depressed and Flat  Thought Process:  Coherent and Goal Directed  Orientation:  Full (Time, Place, and Person)  Thought Content:  Logical  Suicidal Thoughts:  No  Homicidal Thoughts:  No  Memory:  Immediate;   Fair Recent;   Fair Remote;   Fair  Judgement:  Poor  Insight:  Lacking  Psychomotor Activity:  Normal  Concentration:  Concentration: Fair  Recall:  [employer] of Knowledge:  Fair  Language:  Fair  Akathisia:  No  Handed:    AIMS (if indicated):      Assets:  Resilience Social Support  ADL's:  Intact  Cognition:  WNL  Sleep:  Number of Hours: 6.75   Treatment Plan Summary: Daily contact with patient to assess and evaluate symptoms and progress in treatment and Medication management   -Continue inpatient hospitalization  -Will continue today 01/03/2018 plan as below except where it is noted.  -Schizophrenia vs MDD with psychotic features  -Continue abilify 15mg  po qDay  -Continue lexapro 10mg  po qDay  -Anxiety  -Continue vistaril 50mg  po q6h prn anxiety  -agitation     -Continue zydis 5mg  po q8h prn agitation    -Continue geodon 20mg  IM q12h prn severe agitation  -insomnia   -Continue trazodone 50mg  po qhs prn insomnia  -Encourage participation in groups and therapeutic milieu  -disposition planning will be ongoing  Lindell Spar, NP, PMHNP, FNP-BC 01/03/2018, 2:10 PMPatient ID: [name], female   DOB: 1976-02-25, 42 y.o.   MRN: [id number]

## 2018-01-03 NOTE — Progress Notes (Signed)
Patient ID: Isabella [name], female   DOB: 1975-10-02, 42 y.o.   MRN: [id number] DAR Note: Pt is flat, isolative and withdrawn to room-Pt remained in room all evening refusing to participate. Pt was observed in bed with eyes closed all evening. Pt endorsed moderate anxiety. Pt denied SI. Pt remained calm and cooperative. Support, encouragement, and safe environment provided.  15-minute safety checks continue. Safety checks continue.

## 2018-01-03 NOTE — Progress Notes (Signed)
Did not attend group 

## 2018-01-03 NOTE — Progress Notes (Signed)
Recreation Therapy Notes  INPATIENT RECREATION THERAPY ASSESSMENT  [name] MRN: [id number] DOB: 06-17-1975 Today's Date: 01/03/2018       Information Obtained From: Patient(Information also gathered from chart)  Able to Participate in Assessment/Interview: Yes  Patient Presentation: Alert  Reason for Admission (Per Patient): Med Non-Compliance(Per chart: visual hallucinations, passive S/I, not sleeping, not eating, depressed)  Patient Stressors: Other (Comment)(Per chart mental health issues)  Coping Skills:   Isolation, TV, Music, Meditate, Talk, Prayer  Leisure Interests (2+):  Social - Friends, Sports - Other (Comment)(Soccer)  Frequency of Recreation/Participation: Weekly  Awareness of Community Resources:  No  Community Resources:     Current Use:    If no, Barriers?:    Expressed Interest in [place]: Yes  County of Residence:  [place]  Patient Main Form of Transportation: Musician  Patient Strengths:  "I don't know"  Patient Identified Areas of Improvement:  "I don't have any"  Patient Goal for Hospitalization:  "I don't know"  Current SI (including self-harm):  No  Current HI:  No  Current AVH: No  Staff Intervention Plan: Group Attendance, Collaborate with Interdisciplinary Treatment Team  Consent to Intern Participation: N/A    Victorino Sparrow, LRT/CTRS  Victorino Sparrow A 01/03/2018, 12:55 PM

## 2018-01-03 NOTE — Progress Notes (Signed)
Recreation Therapy Notes  Date: 8.21.19 Time: 1000 Location: 500 Hall Dayroom  Group Topic: Communication  Goal Area(s) Addresses:  Patient will effectively communicate with peers in group.  Patient will verbalize benefit of healthy communication. Patient will verbalize positive effect of healthy communication on post d/c goals.  Patient will identify communication techniques that made activity effective for group.   Intervention: Paper, pencils, geometrical shapes  Activity: Geometrical Designs.  Patients were split into groups of 2.  One person was the speaker and the other the listener.  The speaker had to describe the picture they were given to their partner.  Their partner would then draw the picture that was being described.  The listener was no allowed to ask any questions during the activity.  Education: Communication, Discharge Planning  Education Outcome: Acknowledges understanding/In group clarification offered/Needs additional education.   Clinical Observations/Feedback:  Pt did not attend group.    Victorino Sparrow, LRT/CTRS         Victorino Sparrow A 01/03/2018 12:02 PM

## 2018-01-03 NOTE — [facility] Group Notes (Signed)
LCSW Group Therapy Note  01/03/2018 1:15pm    Type of Therapy and Topic:  Group Therapy:  Who Am I?  Self Esteem, Self-Actualization and Understanding Self    Participation Level:  Did Not Attend  Description of Group:    In this group patients will be asked to explore values, beliefs, truths, and morals as they relate to personal self.  Patients will be guided to discuss their thoughts, feelings, and behaviors related to what they identify as important to their true self. Patients will process together how values, beliefs and truths are connected to specific choices patients make every day. Each patient will be challenged to identify changes that they are motivated to make in order to improve self-esteem and self-actualization. This group will be process-oriented, with patients participating in exploration of their own experiences, giving and receiving support, and processing challenge from other group members.   Therapeutic Goals: 1. Patient will identify false beliefs that currently interfere with their self-esteem.  2. Patient will identify feelings, thought process, and behaviors related to self and will become aware of the uniqueness of themselves and of others.  3. Patient will be able to identify and verbalize values, morals, and beliefs as they relate to self. 4. Patient will begin to learn how to build self-esteem/self-awareness by expressing what is important and unique to them personally.   Summary of Patient Progress      Therapeutic Modalities:   Cognitive Behavioral Therapy Solution Focused Therapy Motivational Interviewing Brief Therapy   Trish Mage, LCSW 01/03/2018 4:22 PM

## 2018-01-04 NOTE — Progress Notes (Signed)
Nursing note 7p-7a  Pt observed isolating in room this shift. Displayed a flat affect and mood upon interaction with this Probation officer. Pt denies pain ,denies SI/HI, and also denies any audio or visual hallucinations at this time. Pt stated that family visit went well but continued to be very guarded during assessment.   Pt is able to verbally contract for safety with this RN. Pt is now resting in bed with eyes closed, with no signs or symptoms of pain or distress noted. Pt continues to remain safe on the unit and is observed by rounding every 15 min. RN will continue to monitor.

## 2018-01-04 NOTE — Progress Notes (Signed)
[facility] MD Progress Note  01/04/2018 3:32 PM [name]  MRN:  [id number]  Subjective: Isabella [name] reports in a whispering voice, "I'm feeling so-so, motioning with her right hand. I'm depressed a little today. I don't want to go to group today, I don't feel like it. I took my medicine this morning, it helped a little. I'm sleeping a little bit".  Isabella [name] is a 42 y/o F with history of schizophrenia who was admitted voluntarily from [facility] where she presented with depression, neurovegetative symptoms, not eating/drinking, not caring for herself, and medication non-compliant for the past 2 weeks with possible trigger associated with death of pt's uncle 2 weeks ago. Pt was medically cleared and then transferred to [facility] for additional treatment and stabilization. She was started on trial of abilify and lexapro.   Today upon evaluation, pt was interviewed in her room with no assistance of a Spanish language interpreter. She is able to make herself & needs understood during this assessment. She speaks in a very low whispering voice with decreased volume. She present quite, alert with fair eye contacts. She continues to endorse significant symptoms of depression and anxiety. She says she feels so-so. She denies SI/HI/AH/VH. She says she is  sleeping a little bit. Says did not eat lunch this afternoon because she was not hungry. She is tolerating her medications well. She is being encouraged to come out of her room & attend more group sessions. She confirmed in previous days during a follow-up care interview with the attending psychiatrist that she has had no outpatient follow up since November 2018 with [name] and she last took medications in February 2019. She is in agreement to continue on her current regimen without changes. She had no further questions, comments, or concerns.  Principal Problem: Schizophrenia ([place])  Diagnosis:   Patient Active Problem List   Diagnosis Date Noted  . Schizophrenia ([place])  [F20.9] 12/31/2017  . Social anxiety disorder [F40.10] 08/02/2016  . 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 Bipolar disorder ([place])   . Hypertension   . Migraine 07/13/2016  . Schizophrenia ([place])    History reviewed. No pertinent surgical history. Family History:  Family History  Problem Relation Age of Onset  . Mental illness Neg Hx    Family Psychiatric  History: see H&P Social History:  Social History   Substance and Sexual Activity  Alcohol Use No     Social History   Substance and Sexual Activity  Drug Use No    Social History   Socioeconomic History  . Marital status: Single    Spouse name: Not on file  . Number of children: Not on file  . Years of education: Not on file  . Highest education level: Not on file  Occupational History  . Not on file  Social Needs  . Financial resource strain: Not on file  . Food insecurity:    Worry: Not on file    Inability: Not on file  . Transportation needs:    Medical: Not on file    Non-medical: Not on file  Tobacco Use  . Smoking status: Smoker, Current Status Unknown  . Smokeless tobacco: Never Used  Substance and Sexual Activity  . Alcohol use: No  . Drug use: No  . Sexual activity: Never  Lifestyle  . Physical activity:    Days  per week: Not on file    Minutes per session: Not on file  . Stress: Not on file  Relationships  . Social connections:    Talks on phone: Not on file    Gets together: Not on file    Attends religious service: Not on file    Active member of club or organization: Not on file    Attends meetings of clubs or organizations: Not on file    Relationship status: Not on file  Other Topics Concern  . Not on file  Social History Narrative  . Not on file   Additional Social History:   Sleep: Good  Appetite:   Fair  Current Medications: Current Facility-Administered Medications  Medication Dose Route Frequency Provider Last Rate Last Dose  . acetaminophen (TYLENOL) tablet 650 mg  650 mg Oral Q6H PRN Money, [name], FNP      . alum & mag hydroxide-simeth (MAALOX/MYLANTA) 200-200-20 MG/5ML suspension 30 mL  30 mL Oral Q4H PRN Money, [name], FNP      . ARIPiprazole (ABILIFY) tablet 15 mg  15 mg Oral Daily [name], MD   15 mg at 01/04/18 0819  . escitalopram (LEXAPRO) tablet 10 mg  10 mg Oral Daily [name], MD   10 mg at 01/04/18 0820  . hydrOXYzine (ATARAX/VISTARIL) tablet 50 mg  50 mg Oral Q6H PRN [name], MD      . hyoscyamine (ANASPAZ) disintergrating tablet 0.125 mg  0.125 mg Oral Q4H PRN [name], MD   0.125 mg at 01/04/18 0820  . magnesium hydroxide (MILK OF MAGNESIA) suspension 30 mL  30 mL Oral Daily PRN Money, [name], FNP      . OLANZapine zydis (ZYPREXA) disintegrating tablet 5 mg  5 mg Oral Q8H PRN [name], MD       Or  . ziprasidone (GEODON) injection 20 mg  20 mg Intramuscular Q12H PRN [name], MD      . traZODone (DESYREL) tablet 50 mg  50 mg Oral QHS PRN Money, [name], FNP       Lab Results: No results found for this or any previous visit (from the past 48 hour(s)).  Blood Alcohol level:  Lab Results  Component Value Date   ETH <10 12/29/2017   ETH <5 62/26/3335   Metabolic Disorder Labs: Lab Results  Component Value Date   HGBA1C 5.4 01/31/2016   MPG 108 01/31/2016   Lab Results  Component Value Date   PROLACTIN 124.2 (H) 01/31/2016   Lab Results  Component Value Date   CHOL 152 01/31/2016   TRIG 147 01/31/2016   HDL 39 (L) 01/31/2016   CHOLHDL 3.9 01/31/2016   VLDL 29 01/31/2016   LDLCALC 84 01/31/2016   Physical Findings: AIMS: Facial and Oral Movements Muscles of Facial Expression: None, normal Lips and Perioral Area: None, normal Jaw: None,  normal Tongue: None, normal,Extremity Movements Upper (arms, wrists, hands, fingers): None, normal Lower (legs, knees, ankles, toes): None, normal, Trunk Movements Neck, shoulders, hips: None, normal, Overall Severity Severity of abnormal movements (highest score from questions above): None, normal Incapacitation due to abnormal movements: None, normal Patient's awareness of abnormal movements (rate only patient's report): No Awareness, Dental Status Current problems with teeth and/or dentures?: No Does patient usually wear dentures?: No  CIWA:    COWS:     Musculoskeletal: Strength & Muscle Tone: within normal limits Gait & Station: normal Patient leans: N/A  Psychiatric Specialty  Exam: Physical Exam  Nursing note and vitals reviewed.   Review of Systems  Constitutional: Positive for malaise/fatigue. Negative for chills and fever.  Respiratory: Negative for cough and shortness of breath.   Cardiovascular: Negative for chest pain.  Gastrointestinal: Negative for abdominal pain, heartburn, nausea and vomiting.  Psychiatric/Behavioral: Positive for depression. Negative for hallucinations and suicidal ideas. The patient is not nervous/anxious and does not have insomnia.     Blood pressure 123/73, pulse 67, temperature 98 F (36.7 C), temperature source Oral, resp. rate 16, height 5\' 3"  (1.6 m), weight 83.9 kg, SpO2 96 %.Body mass index is 32.77 kg/m.  General Appearance: Casual and Fairly Groomed  Eye Contact:  Minimal  Speech:  Slow  Volume:  Decreased  Mood:  Anxious and Depressed  Affect:  Appropriate, Congruent, Depressed and Flat  Thought Process:  Coherent and Goal Directed  Orientation:  Full (Time, Place, and Person)  Thought Content:  Logical  Suicidal Thoughts:  No  Homicidal Thoughts:  No  Memory:  Immediate;   Fair Recent;   Fair Remote;   Fair  Judgement:  Poor  Insight:  Lacking  Psychomotor Activity:  Normal  Concentration:  Concentration: Fair  Recall:   [employer] of Knowledge:  Fair  Language:  Fair  Akathisia:  No  Handed:    AIMS (if indicated):     Assets:  Resilience Social Support  ADL's:  Intact  Cognition:  WNL  Sleep:  Number of Hours: 6.25   Treatment Plan Summary: Daily contact with patient to assess and evaluate symptoms and progress in treatment and Medication management   -Continue inpatient hospitalization  -Will continue today 01/04/2018 plan as below except where it is noted.  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   MRN: [id number]

## 2018-01-04 NOTE — Tx Team (Signed)
Interdisciplinary Treatment and Diagnostic Plan Update  01/04/2018 Time of Session: 11:15 AM  Isabella [name] MRN: [id number]  Principal Diagnosis: Schizophrenia [facility])  Secondary Diagnoses: Principal Problem:   Schizophrenia ([place])   Current Medications:  Current Facility-Administered Medications  Medication Dose Route Frequency Provider Last Rate Last Dose  . acetaminophen (TYLENOL) tablet 650 mg  650 mg Oral Q6H PRN Money, [name], FNP      . alum & mag hydroxide-simeth (MAALOX/MYLANTA) 200-200-20 MG/5ML suspension 30 mL  30 mL Oral Q4H PRN Money, [name], FNP      . ARIPiprazole (ABILIFY) tablet 15 mg  15 mg Oral Daily [name], MD   15 mg at 01/04/18 0819  . escitalopram (LEXAPRO) tablet 10 mg  10 mg Oral Daily [name], MD   10 mg at 01/04/18 0820  . hydrOXYzine (ATARAX/VISTARIL) tablet 50 mg  50 mg Oral Q6H PRN [name], MD      . hyoscyamine (ANASPAZ) disintergrating tablet 0.125 mg  0.125 mg Oral Q4H PRN [name], MD   0.125 mg at 01/04/18 0820  . magnesium hydroxide (MILK OF MAGNESIA) suspension 30 mL  30 mL Oral Daily PRN Money, [name], FNP      . OLANZapine zydis (ZYPREXA) disintegrating tablet 5 mg  5 mg Oral Q8H PRN [name], MD       Or  . ziprasidone (GEODON) injection 20 mg  20 mg Intramuscular Q12H PRN [name], MD      . traZODone (DESYREL) tablet 50 mg  50 mg Oral QHS PRN Money, [name], FNP        PTA Medications: Medications Prior to Admission  Medication Sig Dispense Refill Last Dose  . benztropine (COGENTIN) 0.5 MG tablet Take 1 tablet (0.5 mg total) by mouth at bedtime. (Patient not taking: Reported on 12/30/2017) 30 tablet 0 Not Taking at Unknown time  . clonazePAM (KLONOPIN) 0.5 MG tablet Take 0.5 tablets (0.25 mg total) by mouth 2 (two) times daily. Please instruct patient to cut the tablet in half. (Patient not taking: Reported on 12/30/2017) 30 tablet 0 Not  Taking at Unknown time  . doxepin (SINEQUAN) 25 MG capsule Take 1 capsule (25 mg total) by mouth at bedtime. (Patient not taking: Reported on 12/30/2017) 30 capsule 0 Not Taking at Unknown time  . hydrOXYzine (ATARAX/VISTARIL) 25 MG tablet Take 1 tablet (25 mg total) by mouth every 6 (six) hours as needed for anxiety. (Patient not taking: Reported on 12/30/2017) 30 tablet 0 Not Taking at Unknown time  . hyoscyamine (LEVSIN SL) 0.125 MG SL tablet Place 0.125 mg under the tongue every 4 (four) hours as needed for cramping. For 10 days  0 12/26/2017  . losartan (COZAAR) 25 MG tablet Take 1 tablet (25 mg total) by mouth daily. (Patient not taking: Reported on 12/30/2017) 30 tablet 0 Not Taking at Unknown time  . megestrol (MEGACE) 400 MG/10ML suspension Take 10 mLs (400 mg total) by mouth 2 (two) times daily. (Patient not taking: Reported on 12/30/2017) 240 mL 0 Not Taking at Unknown time  . OLANZapine (ZYPREXA) 2.5 MG tablet Take 5 tablets (12.5 mg total) by mouth at bedtime. (Patient not taking: Reported on 12/30/2017) 150 tablet 0 Not Taking at Unknown time  . propranolol (INDERAL) 10 MG tablet Take 1 tablet (10 mg total) by mouth 2 (two) times daily. (Patient not taking: Reported on 12/30/2017) 60 tablet 0 Not Taking at Unknown time  . traZODone (DESYREL) 50  MG tablet Take 1 tablet (50 mg total) by mouth at bedtime as needed for sleep. (Patient not taking: Reported on 12/30/2017) 30 tablet 0 Not Taking at Unknown time  . venlafaxine XR (EFFEXOR-XR) 75 MG 24 hr capsule Take 3 capsules (225 mg total) by mouth daily with breakfast. (Patient not taking: Reported on 12/30/2017) 90 capsule 0 Not Taking at Unknown time    Patient Stressors: Other: mental health problems  Patient Strengths: Average or above average intelligence Physical Health Supportive family/friends  Treatment Modalities: Medication Management, Group therapy, Case management,  1 to 1 session with clinician, Psychoeducation, Recreational  therapy.   Physician Treatment Plan for Primary Diagnosis: Schizophrenia ([place]) Long Term Goal(s): Improvement in symptoms so as ready for discharge  Short Term Goals: Ability to identify and develop effective coping behaviors will improve Ability to demonstrate self-control will improve  Medication Management: Evaluate patient's response, side effects, and tolerance of medication regimen.  Therapeutic Interventions: 1 to 1 sessions, Unit Group sessions and Medication administration.  Evaluation of Outcomes: Progressing   8/22: "I have been feeling depressed for a long time & I'm still feeling very depressed. 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  Therapeutic Interventions: 1 to 1 sessions, Unit Group sessions and Medication administration.  Evaluation of Outcomes: Progressing   RN Treatment Plan for Primary Diagnosis: [place] ([facility] Term Goal(s): Knowledge of disease and therapeutic regimen to maintain health will improve  Short Term Goals: Ability to identify and develop effective coping behaviors will improve and Compliance with prescribed medications will improve  Medication Management: RN will administer medications as ordered by provider, will assess and evaluate patient's response and provide education to patient for prescribed medication. RN will report any  adverse and/or side effects to prescribing provider.  Therapeutic Interventions: 1 on 1 counseling sessions, Psychoeducation, Medication administration, Evaluate responses to treatment, Monitor vital signs and CBGs as ordered, Perform/monitor CIWA, COWS, AIMS and Fall Risk screenings as ordered, Perform wound care treatments as ordered.  Evaluation of Outcomes: Progressing   LCSW Treatment Plan for Primary Diagnosis: [place]izophrenia ([facility] [place] Term Goal(s): Safe transition to appropriate next level of care at discharge, Engage patient in therapeutic group addressing interpersonal concerns.  Short Term Goals: Engage patient in aftercare planning with referrals and resources  Therapeutic Interventions: Assess for all discharge needs, 1 to 1 time with Social worker, Explore available resources and support systems, Assess for adequacy in community support network, Educate family and significant other(s) on suicide prevention, Complete Psychosocial Assessment, Interpersonal group therapy.  Evaluation of Outcomes: Met  Return home, follow up [facility]   Progress in Treatment: Attending groups: No Participating in groups: No Taking medication as prescribed: Yes Toleration medication: Yes, no side effects reported at this time Family/Significant other contact made: No Patient understands diagnosis:  No Limited insight Discussing patient identified problems/goals with staff: Yes Medical problems stabilized or resolved: Yes Denies suicidal/homicidal ideation: Yes Issues/concerns per patient self-inventory: None Other: N/A  New problem(s) identified: None identified at this time.   New Short Term/Long Term Goal(s): "I feel sad, and I sleep too much."  Discharge Plan or Barriers:   Reason for Continuation of Hospitalization:  Delusions  Depression  Medication stabilization Suicidal ideation   Estimated Length of Stay: 8/27  Attendees: Patient:  01/04/2018  11:15 AM  Physician:  Maris Berger, MD 01/04/2018  11:15 AM  Nursing: Sena Hitch, RN 01/04/2018  11:15 AM  RN Care Manager: Lars Pinks, RN 01/04/2018  11:15 AM  Social Worker: Ripley Fraise 01/04/2018  11:15 AM  Recreational Therapist: Winfield Cunas 01/04/2018  11:15 AM  Other: Norberto Sorenson 01/04/2018  11:15 AM  Other:  01/04/2018  11:15 AM    Scribe for Treatment Team:  Roque Lias LCSW 01/04/2018 11:15 AM

## 2018-01-04 NOTE — Progress Notes (Signed)
Did not attend group 

## 2018-01-04 NOTE — [facility] Group Notes (Signed)
LCSW Group Therapy Note  01/04/2018 1:15pm  Type of Therapy/Topic:  Group Therapy:  Balance in Life  Participation Level:  Minimal  Description of Group:    This group will address the concept of balance and how it feels and looks when one is unbalanced. Patients will be encouraged to process areas in their lives that are out of balance and identify reasons for remaining unbalanced. Facilitators will guide patients in utilizing problem-solving interventions to address and correct the stressor making their life unbalanced. Understanding and applying boundaries will be explored and addressed for obtaining and maintaining a balanced life. Patients will be encouraged to explore ways to assertively make their unbalanced needs known to significant others in their lives, using other group members and facilitator for support and feedback.  Therapeutic Goals: 1. Patient will identify two or more emotions or situations they have that consume much of in their lives. 2. Patient will identify signs/triggers that life has become out of balance:  3. Patient will identify two ways to set boundaries in order to achieve balance in their lives:  4. Patient will demonstrate ability to communicate their needs through discussion and/or role plays  Summary of Patient Progress:  Stayed the entire time, engaged throughout. Minimal interaction, flat affect, grim appearing.  But willing to answer when addressed directly and responses were related tot he topic at hand.    Therapeutic Modalities:   Cognitive Behavioral Therapy Solution-Focused Therapy Assertiveness Training  Trish Mage, [place] 01/04/2018 3:14 PM

## 2018-01-04 NOTE — Progress Notes (Signed)
Recreation Therapy Notes  Date: 8.22.19 Time: 1000 Location: 500 Hall Dayroom  Group Topic: Self-Esteem  Goal Area(s) Addresses:  Patient will successfully identify positive attributes about themselves.  Patient will successfully identify benefit of improved self-esteem.   Intervention: Magazines, scissors, glue sticks, Architect paper, music  Activity: Brochure About Me.  Patients were to create a brochure that described their uniqueness and any positive characteristics about them.  LRT played music in the background as patients worked on their brochures.   Education:  Self-Esteem, Dentist.   Education Outcome: Acknowledges education/In group clarification offered/Needs additional education  Clinical Observations/Feedback: Pt did not attend group.    Victorino Sparrow, LRT/CTRS         Victorino Sparrow A 01/04/2018 12:37 PM

## 2018-01-04 NOTE — Plan of Care (Signed)
  Problem: Education: Goal: Knowledge of [place] [facility] Education information/materials will improve Outcome: Progressing Goal: Emotional status will improve Outcome: Progressing Goal: Mental status will improve Outcome: Progressing Goal: Verbalization of understanding the information provided will improve Outcome: Progressing   Problem: Activity: Goal: Interest or engagement in activities will improve Outcome: Progressing Goal: Sleeping patterns will improve Outcome: Progressing   Problem: Coping: Goal: Ability to verbalize frustrations and anger appropriately will improve Outcome: Progressing Goal: Ability to demonstrate self-control will improve Outcome: Progressing   Problem: Health Behavior/Discharge Planning: Goal: Identification of resources available to assist in meeting health care needs will improve Outcome: Progressing Goal: Compliance with treatment plan for underlying cause of condition will improve Outcome: Progressing   Problem: Physical Regulation: Goal: Ability to maintain clinical measurements within normal limits will improve Outcome: Progressing   Problem: Safety: Goal: Periods of time without injury will increase Outcome: Progressing   Problem: Education: Goal: Utilization of techniques to improve thought processes will improve Outcome: Progressing Goal: Knowledge of the prescribed therapeutic regimen will improve Outcome: Progressing   Problem: Activity: Goal: Interest or engagement in leisure activities will improve Outcome: Progressing Goal: Imbalance in normal sleep/wake cycle will improve Outcome: Progressing   Problem: Coping: Goal: Coping ability will improve Outcome: Progressing Goal: Will verbalize feelings Outcome: Progressing   Problem: Health Behavior/Discharge Planning: Goal: Ability to make decisions will improve Outcome: Progressing Goal: Compliance with therapeutic regimen will improve Outcome: Progressing    Problem: Role Relationship: Goal: Will demonstrate positive changes in social behaviors and relationships Outcome: Progressing   Problem: Safety: Goal: Ability to disclose and discuss suicidal ideas will improve Outcome: Progressing Goal: Ability to identify and utilize support systems that promote safety will improve Outcome: Progressing   Problem: Self-Concept: Goal: Will verbalize positive feelings about self Outcome: Progressing Goal: Level of anxiety will decrease Outcome: Progressing   Problem: Education: Goal: Ability to make informed decisions regarding treatment will improve Outcome: Progressing   Problem: Coping: Goal: Coping ability will improve Outcome: Progressing   Problem: Health Behavior/Discharge Planning: Goal: Identification of resources available to assist in meeting health care needs will improve Outcome: Progressing   Problem: Medication: Goal: Compliance with prescribed medication regimen will improve Outcome: Progressing   Problem: Self-Concept: Goal: Ability to disclose and discuss suicidal ideas will improve Outcome: Progressing Goal: Will verbalize positive feelings about self Outcome: Progressing   

## 2018-01-04 NOTE — Plan of Care (Signed)
  Problem: Activity: Goal: Interest or engagement in activities will improve Outcome: Not Progressing   Problem: Medication: Goal: Compliance with prescribed medication regimen will improve Outcome: Progressing  DAR NOTE: Patient presents with flat affect and depressed mood.  Denies suicidal thoughts, pain, auditory and visual hallucinations.  Described energy level as low and concentration as poor.  Rates depression at 8, hopelessness at 0, and anxiety at 8.  Maintained on routine safety checks.  Medications given as prescribed.  Support and encouragement offered as needed.    Patient remained withdrawn and isolates to her room.  No interaction with staff or peers.  Patient is safe on the unit. Offered no complaint.

## 2018-01-05 LAB — URINALYSIS, ROUTINE W REFLEX MICROSCOPIC
BACTERIA UA: NONE SEEN
Bilirubin Urine: NEGATIVE
GLUCOSE, UA: NEGATIVE mg/dL
HGB URINE DIPSTICK: NEGATIVE
KETONES UR: 20 mg/dL — AB
LEUKOCYTES UA: NEGATIVE
Nitrite: NEGATIVE
PROTEIN: NEGATIVE mg/dL
Specific Gravity, Urine: 1.024 (ref 1.005–1.030)
pH: 6 (ref 5.0–8.0)

## 2018-01-05 NOTE — Progress Notes (Signed)
D: Pt awake in bed at this time. Denies SI, HI, AVH and pain when assessed. Remains guarded, isolative, depressed with flat /sullen affect. Appetite is poor, refused breakfast, required increased prompts to eat lunch. Compliant with medications when offered. Denies adverse drug reactions when assessed. Refused to attend recreation therapy group; attended CSW group with limited engagement.  A: Emotional support and availability provided to pt. Encouraged pt to voice concerns and comply with current treatment regimen including groups. Safety checks maintained without self harm gestures or outburst. R: Pt continues to need redirections to participate in unit activities, engage in self care. POC continues for safety and mood stability.

## 2018-01-05 NOTE — Progress Notes (Signed)
[facility] MD Progress Note  01/05/2018 3:07 PM Isabella [name]  MRN:  [id number] Subjective:    Isabella [name] is a 42 y/o F with history of schizophrenia who was admitted voluntarily from [facility] where she presented with depression, neurovegetative symptoms, not eating/drinking, not caring for herself, and medication non-compliant for the past 2 weeks with possible trigger associated with death of pt's uncle 2 weeks ago. Pt was medically cleared and then transferred to [facility] for additional treatment and stabilization. She was started on trial of abilify and lexapro. Pt has been having incremental improvement of her presenting symptoms.  Today upon evaluation, pt is in bed and she has fair eye contact which has improved compared to previous encounters. She has improved latency of her responses, but her speech is soft and her responses are short/minimal. She shares, "I'm doing better." However, she adds, "I don't feel good inside." She denies physical complaints, and instead clarifies that she is still struggling with depression. She notes it is improving slowly during her stay. She denies SI/HI/AH/VH. She is sleeping well. Her appetite is fair. She is tolerating her medications well. Her only physical complaint remains decreased energy. Pt has been attending some groups, and she was encouraged to continue to participate as much as possible and spend as much time out of bed as possible. Pt verbalized good understanding She is in agreement to continue on her current regimen without changes. She had no further questions, comments, or concerns.  Principal Problem: Schizophrenia ([place]) Diagnosis:   Patient Active Problem List   Diagnosis Date Noted  . Schizophrenia ([place]) [F20.9] 12/31/2017  . Social anxiety disorder [F40.10] 08/02/2016  . 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 Bipolar disorder ([place])   . Hypertension   . Migraine 07/13/2016  . Schizophrenia ([place])    History reviewed. No pertinent surgical history. Family History:  Family History  Problem Relation Age of Onset  . Mental illness Neg Hx    Family Psychiatric  History: see H&P Social History:  Social History   Substance and Sexual Activity  Alcohol Use No     Social History   Substance and Sexual Activity  Drug Use No    Social History   Socioeconomic History  . Marital status: Single    Spouse name: Not on file  . Number of children: Not on file  . Years of education: Not on file  . Highest education level: Not on file  Occupational History  . Not on file  Social Needs  . Financial resource strain: Not on file  . Food insecurity:    Worry: Not on file    Inability: Not on file  . Transportation needs:    Medical: Not on file    Non-medical: Not on file  Tobacco Use  . Smoking status: Smoker, Current Status Unknown  . Smokeless tobacco: Never Used  Substance and Sexual Activity  . Alcohol use: No  . Drug use: No  . Sexual activity: Never  Lifestyle  . Physical activity:    Days per week: Not on file    Minutes per session: Not on file  . Stress: Not on file  Relationships  . Social connections:    Talks on phone: Not on file    Gets together: Not on file    Attends religious service: Not on file  Active member of club or organization: Not on file    Attends meetings of clubs or organizations: Not on file    Relationship status: Not on file  Other Topics Concern  . Not on file  Social History Narrative  . Not on file   Additional Social History:                         Sleep: Good  Appetite:  Good  Current Medications: Current Facility-Administered Medications  Medication Dose Route Frequency Provider Last Rate Last Dose  . acetaminophen (TYLENOL) tablet 650 mg  650 mg Oral Q6H PRN Money, [name], FNP      .  alum & mag hydroxide-simeth (MAALOX/MYLANTA) 200-200-20 MG/5ML suspension 30 mL  30 mL Oral Q4H PRN Money, [name], FNP      . ARIPiprazole (ABILIFY) tablet 15 mg  15 mg Oral Daily [name], MD   15 mg at 01/05/18 0830  . escitalopram (LEXAPRO) tablet 10 mg  10 mg Oral Daily [name], MD   10 mg at 01/05/18 0830  . hydrOXYzine (ATARAX/VISTARIL) tablet 50 mg  50 mg Oral Q6H PRN [name], MD      . hyoscyamine (ANASPAZ) disintergrating tablet 0.125 mg  0.125 mg Oral Q4H PRN [name], MD   0.125 mg at 01/04/18 0820  . magnesium hydroxide (MILK OF MAGNESIA) suspension 30 mL  30 mL Oral Daily PRN Money, [name], FNP      . OLANZapine zydis (ZYPREXA) disintegrating tablet 5 mg  5 mg Oral Q8H PRN [name], MD       Or  . ziprasidone (GEODON) injection 20 mg  20 mg Intramuscular Q12H PRN [name], MD      . traZODone (DESYREL) tablet 50 mg  50 mg Oral QHS PRN Money, [name], FNP        Lab Results:  Results for orders placed or performed during the hospital encounter of 12/31/17 (from the past 48 hour(s))  Urinalysis, Routine w reflex microscopic     Status: Abnormal   Collection Time: 01/04/18 12:22 PM  Result Value Ref Range   Color, Urine YELLOW YELLOW   APPearance TURBID (A) CLEAR   Specific Gravity, Urine 1.024 1.005 - 1.030   pH 6.0 5.0 - 8.0   Glucose, UA NEGATIVE NEGATIVE mg/dL   Hgb urine dipstick NEGATIVE NEGATIVE   Bilirubin Urine NEGATIVE NEGATIVE   Ketones, ur 20 (A) NEGATIVE mg/dL   Protein, ur NEGATIVE NEGATIVE mg/dL   Nitrite NEGATIVE NEGATIVE   Leukocytes, UA NEGATIVE NEGATIVE   Bacteria, UA NONE SEEN NONE SEEN   Squamous Epithelial / LPF 11-20 0 - 5   Mucus PRESENT    Amorphous Crystal PRESENT     Comment: Performed at [facility], [place] [address] [place],  [address]    Blood Alcohol level:  Lab Results  Component Value Date   ETH <10 12/29/2017    ETH <5 98/92/1194    Metabolic Disorder Labs: Lab Results  Component Value Date   HGBA1C 5.4 01/31/2016   MPG 108 01/31/2016   Lab Results  Component Value Date   PROLACTIN 124.2 (H) 01/31/2016   Lab Results  Component Value Date   CHOL 152 01/31/2016   TRIG 147 01/31/2016   HDL 39 (L) 01/31/2016   CHOLHDL 3.9 01/31/2016   VLDL 29 01/31/2016   LDLCALC 84 01/31/2016    Physical  Findings: AIMS: Facial and Oral Movements Muscles of Facial Expression: None, normal Lips and Perioral Area: None, normal Jaw: None, normal Tongue: None, normal,Extremity Movements Upper (arms, wrists, hands, fingers): None, normal Lower (legs, knees, ankles, toes): None, normal, Trunk Movements Neck, shoulders, hips: None, normal, Overall Severity Severity of abnormal movements (highest score from questions above): None, normal Incapacitation due to abnormal movements: None, normal Patient's awareness of abnormal movements (rate only patient's report): No Awareness, Dental Status Current problems with teeth and/or dentures?: No Does patient usually wear dentures?: No  CIWA:    COWS:     Musculoskeletal: Strength & Muscle Tone: within normal limits Gait & Station: normal Patient leans: N/A  Psychiatric Specialty Exam: Physical Exam  Nursing note and vitals reviewed.   Review of Systems  Constitutional: Negative for chills and fever.  Respiratory: Negative for cough and shortness of breath.   Cardiovascular: Negative for chest pain.  Gastrointestinal: Negative for abdominal pain, heartburn, nausea and vomiting.  Psychiatric/Behavioral: Positive for depression. Negative for hallucinations and suicidal ideas. The patient is not nervous/anxious and does not have insomnia.     Blood pressure 116/68, pulse 77, temperature 98.7 F (37.1 C), temperature source Oral, resp. rate 16, height 5\' 3"  (1.6 m), weight 83.9 kg, SpO2 96 %.Body mass index is 32.77 kg/m.  General Appearance: Casual and  Fairly Groomed  Eye Contact:  Fair  Speech:  Clear and Coherent and Slow  Volume:  Decreased  Mood:  Anxious and Depressed  Affect:  Congruent, Constricted, Depressed and Flat  Thought Process:  Coherent and Goal Directed  Orientation:  Full (Time, Place, and Person)  Thought Content:  Logical  Suicidal Thoughts:  No  Homicidal Thoughts:  No  Memory:  Immediate;   Fair Recent;   Fair Remote;   Fair  Judgement:  Poor  Insight:  Lacking  Psychomotor Activity:  Normal  Concentration:  Concentration: Fair  Recall:  [employer] of Knowledge:  Fair  Language:  Fair  Akathisia:  No  Handed:    AIMS (if indicated):     Assets:  Communication Skills Desire for Improvement Housing Physical Health Resilience Social Support  ADL's:  Intact  Cognition:  WNL  Sleep:  Number of Hours: 6.75   Treatment Plan Summary: Daily contact with patient to assess and evaluate symptoms and progress in treatment and Medication management   -Continue inpatient hospitalization  -Will continue today 01/05/2018 plan as below except where it is noted.  -Schizophrenia vs MDD with psychotic features             -Continue abilify 15mg  po qDay             -Continue lexapro 10mg  po qDay  -Anxiety             -Continue vistaril 50mg  po q6h prn anxiety  -agitation                      -Continue zydis 5mg  po q8h prn agitation                   -Continue geodon 20mg  IM q12h prn severe agitation  -insomnia              -Continue trazodone 50mg  po qhs prn insomnia  -Encourage participation in groups and therapeutic milieu  -disposition planning will be ongoing  [name], MD 01/05/2018, 3:07 PM

## 2018-01-05 NOTE — [facility] Group Notes (Signed)
[facility] LCSW Group Therapy  01/05/2018  1:05 PM  Type of Therapy:  Group therapy  Participation Level:  Did not attend  Participation Quality:  Attentive  Affect:  Flat  Cognitive:  Oriented  Insight:  Limited  Engagement in Therapy:  Limited  Modes of Intervention:  Discussion, Socialization  Summary of Progress/Problems:  Chaplain was here to lead a group on themes of hope and courage.  Roque Lias B 01/05/2018 1:27 PM

## 2018-01-05 NOTE — Progress Notes (Signed)
Recreation Therapy Notes  Date: 8.23.19 Time: 1000 Location: 500 Hall Dayroom   Group Topic: Communication, Team Building, Problem Solving  Goal Area(s) Addresses:  Patient will effectively work with peer towards shared goal.  Patient will identify skill used to make activity successful.  Patient will identify how skills used during activity can be used to reach post d/c goals.   Intervention: STEM Activity   Activity: [employer]. Patients were provided the following materials: 5 drinking straws, 5 rubber bands, 5 paper clips, 2 index cards, 2 drinking cups, and 2 toilet paper rolls. Using the provided materials patients were asked to build a launching mechanisms to launch a ping pong ball approximately 12 feet. Patients were divided into teams of 3-5.   Education: Education officer, community, Dentist.   Education Outcome: Acknowledges education/In group clarification offered/Needs additional education.   Clinical Observations/Feedback: Pt did not attend group.    Victorino Sparrow, LRT/CTRS         Victorino Sparrow A 01/05/2018 12:45 PM

## 2018-01-05 NOTE — Progress Notes (Signed)
Pt is observed in her room, seen resting in bed with eyes open. Pt did not attend wrap-up group. Pt appears flat/sullen in affect and mood. Pt denies SI/HI/AVH/Pain at this time. Pt remains isolative to room.Pt was encourage to push fluids. Support provided.No additional c/o's. Will continue with POC.

## 2018-01-06 DIAGNOSIS — R451 Restlessness and agitation: Secondary | ICD-10-CM

## 2018-01-06 DIAGNOSIS — F419 Anxiety disorder, unspecified: Secondary | ICD-10-CM

## 2018-01-06 DIAGNOSIS — G4700 Insomnia, unspecified: Secondary | ICD-10-CM

## 2018-01-06 MED ORDER — ENSURE ENLIVE PO LIQD
237.0000 mL | Freq: Two times a day (BID) | ORAL | Status: DC
  Administered 2018-01-06 – 2018-01-09 (×8): 237 mL via ORAL

## 2018-01-06 NOTE — Progress Notes (Addendum)
Patient reports not feeling well, stating that he hasn't slept in days. Pt retiring to bed early after eating dinner.

## 2018-01-06 NOTE — Progress Notes (Signed)
D. Pt presents with flat affect and sullen mood- observed sitting quietly in the dayroom attending group led by SW. Pt is calm and cooperative but guarded- speaking very softly using short responses. Pt did not want to go to the dining hall for lunch but chose to stay back- and is sitting in the dayroom quietly with her lunch tray beside her. Pt did not eat breakfast and is not eating her lunch, despite being offered other options.. Pt currently denies SI/HI and AVH   A. Labs and vitals monitored. Pt compliant with medications. Pt provided with a pitcher of Gatorade and encouraged to drink fluids. Pt supported emotionally and encouraged to express concerns and ask questions.   R. Pt remains safe with 15 minute checks. Will continue POC.    to stay on the unit. Pt

## 2018-01-06 NOTE — Progress Notes (Signed)
   01/06/18 0500  Sleep  Number of Hours 3

## 2018-01-06 NOTE — Progress Notes (Signed)
Patient has been isolative to her room tonight/ She did not attend wrap up group nor eat a snack. Writer encouraged patient to come to dayroom and  Pick out snacks but she nodded head no. She has been lying in bed looking at the ceiling. She answered writers questions by nodding no. Fluids given, safety maintained on unit with 15 min checks.

## 2018-01-06 NOTE — [facility] Group Notes (Signed)
  [facility] LCSW Group Therapy Note  Date/Time:  01/06/2018 11:15AM-12:00PM  Type of Therapy and Topic:  Group Therapy:  Feelings About Hospitalization  Participation Level:  Minimal   Description of Group This process group involved patients discussing their feelings related to being hospitalized, as well as the benefits they see to being in the hospital.  These feelings and benefits were itemized.  The group then brainstormed specific ways in which they could seek those same benefits when they discharge and return home.  Therapeutic Goals 1. Patient will identify and describe positive and negative feelings related to hospitalization 2. Patient will verbalize benefits of hospitalization to themselves personally 3. Patients will brainstorm together ways they can obtain similar benefits in the outpatient setting, identify barriers to wellness and possible solutions  Summary of Patient Progress:  The patient expressed her primary feelings about being hospitalized are "good."  She could not come up with any other words, and appeared to have significant thought blocking.  Therapeutic Modalities Cognitive Behavioral Therapy Motivational Interviewing    Selmer Dominion, LCSW 01/06/2018, 2:03 PM

## 2018-01-06 NOTE — Progress Notes (Addendum)
[facility] MD Progress Note  01/06/2018 2:01 PM Isabella [name]  MRN:  [id number] Subjective:     Isabella [name] observed attending daily group sessions.  She is awake oriented *3.  Patient presents flat, guarded and depressed.  Patient has minimal with responses.  Is taking her medications prescribed and tolerating them well. Isabella [name] denies suicidal or homicidal ideations.  Denies auditory or visual hallucinations.  Chart review patient continues to need encouragement for medications.  Was reported minimal.  Interactions throughout the milieu and during daily group sessions support reports she is resting well during the night with head-nodding left to right.  Staff has concerns with appetite. Ensure supplement was ordered. Support and encouragement reassurance was provided.   Isabella [name] is a 42 y/o F with history of schizophrenia who was admitted voluntarily from [facility] where she presented with depression, neurovegetative symptoms, not eating/drinking, not caring for herself, and medication non-compliant for the past 2 weeks with possible trigger associated with death of pt's uncle 2 weeks ago. Pt was medically cleared and then transferred to [facility] for additional treatment and stabilization. She was started on trial of abilify and lexapro. Pt has been having incremental improvement of her presenting symptoms.   Principal Problem: Schizophrenia ([place]) Diagnosis:   Patient Active Problem List   Diagnosis Date Noted  . Schizophrenia ([place]) [F20.9] 12/31/2017  . Social anxiety disorder [F40.10] 08/02/2016  . 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 Bipolar disorder ([place])   . Hypertension   . Migraine 07/13/2016  . Schizophrenia ([place])    History reviewed. No pertinent surgical history. Family History:  Family History  Problem Relation Age of Onset  .  Mental illness Neg Hx    Family Psychiatric  History: see H&P Social History:  Social History   Substance and Sexual Activity  Alcohol Use No     Social History   Substance and Sexual Activity  Drug Use No    Social History   Socioeconomic History  . Marital status: Single    Spouse name: Not on file  . Number of children: Not on file  . Years of education: Not on file  . Highest education level: Not on file  Occupational History  . Not on file  Social Needs  . Financial resource strain: Not on file  . Food insecurity:    Worry: Not on file    Inability: Not on file  . Transportation needs:    Medical: Not on file    Non-medical: Not on file  Tobacco Use  . Smoking status: Smoker, Current Status Unknown  . Smokeless tobacco: Never Used  Substance and Sexual Activity  . Alcohol use: No  . Drug use: No  . Sexual activity: Never  Lifestyle  . Physical activity:    Days per week: Not on file    Minutes per session: Not on file  . Stress: Not on file  Relationships  . Social connections:    Talks on phone: Not on file    Gets together: Not on file    Attends religious service: Not on file    Active member of club or organization: Not on file    Attends meetings of clubs or organizations: Not on file    Relationship status: Not on file  Other Topics Concern  . Not on file  Social History Narrative  .  Not on file   Additional Social History:                         Sleep: Good  Appetite:  Good  Current Medications: Current Facility-Administered Medications  Medication Dose Route Frequency Provider Last Rate Last Dose  . acetaminophen (TYLENOL) tablet 650 mg  650 mg Oral Q6H PRN Money, [name], FNP      . alum & mag hydroxide-simeth (MAALOX/MYLANTA) 200-200-20 MG/5ML suspension 30 mL  30 mL Oral Q4H PRN Money, [name], FNP      . ARIPiprazole (ABILIFY) tablet 15 mg  15 mg Oral Daily [name], MD   15 mg at 01/06/18 0806  .  escitalopram (LEXAPRO) tablet 10 mg  10 mg Oral Daily [name], MD   10 mg at 01/06/18 5102  . hydrOXYzine (ATARAX/VISTARIL) tablet 50 mg  50 mg Oral Q6H PRN [name], MD      . hyoscyamine (ANASPAZ) disintergrating tablet 0.125 mg  0.125 mg Oral Q4H PRN [name], MD   0.125 mg at 01/04/18 0820  . magnesium hydroxide (MILK OF MAGNESIA) suspension 30 mL  30 mL Oral Daily PRN Money, [name], FNP      . OLANZapine zydis (ZYPREXA) disintegrating tablet 5 mg  5 mg Oral Q8H PRN [name], MD       Or  . ziprasidone (GEODON) injection 20 mg  20 mg Intramuscular Q12H PRN [name], MD      . traZODone (DESYREL) tablet 50 mg  50 mg Oral QHS PRN Money, [name], FNP        Lab Results:  No results found for this or any previous visit (from the past 48 hour(s)).  Blood Alcohol level:  Lab Results  Component Value Date   ETH <10 12/29/2017   ETH <5 58/52/7782    Metabolic Disorder Labs: Lab Results  Component Value Date   HGBA1C 5.4 01/31/2016   MPG 108 01/31/2016   Lab Results  Component Value Date   PROLACTIN 124.2 (H) 01/31/2016   Lab Results  Component Value Date   CHOL 152 01/31/2016   TRIG 147 01/31/2016   HDL 39 (L) 01/31/2016   CHOLHDL 3.9 01/31/2016   VLDL 29 01/31/2016   LDLCALC 84 01/31/2016    Physical Findings: AIMS: Facial and Oral Movements Muscles of Facial Expression: None, normal Lips and Perioral Area: None, normal Jaw: None, normal Tongue: None, normal,Extremity Movements Upper (arms, wrists, hands, fingers): None, normal Lower (legs, knees, ankles, toes): None, normal, Trunk Movements Neck, shoulders, hips: None, normal, Overall Severity Severity of abnormal movements (highest score from questions above): None, normal Incapacitation due to abnormal movements: None, normal Patient's awareness of abnormal movements (rate only patient's report): No Awareness, Dental Status Current  problems with teeth and/or dentures?: No Does patient usually wear dentures?: No  CIWA:    COWS:     Musculoskeletal: Strength & Muscle Tone: within normal limits Gait & Station: normal Patient leans: N/A  Psychiatric Specialty Exam: Physical Exam  Nursing note and vitals reviewed. Cardiovascular: Normal rate.  Neurological: She is alert.  Psychiatric: She has a normal mood and affect. Her behavior is normal.    Review of Systems  Psychiatric/Behavioral: Positive for depression. Negative for hallucinations and suicidal ideas. The patient is not nervous/anxious and does not have insomnia.   All other systems reviewed and are negative.   Blood pressure 123/77, pulse 74,  temperature 98.6 F (37 C), temperature source Oral, resp. rate 16, height 5\' 3"  (1.6 m), weight 83.9 kg, SpO2 96 %.Body mass index is 32.77 kg/m.  General Appearance: Casual and Fairly Groomed  Eye Contact:  Fair  Speech:  Clear and Coherent and Slow  Volume:  Decreased  Mood:  Anxious and Depressed  Affect:  Congruent, Constricted, Depressed and Flat  Thought Process:  Coherent and Goal Directed  Orientation:  Full (Time, Place, and Person)  Thought Content:  Logical  Suicidal Thoughts:  No  Homicidal Thoughts:  No  Memory:  Immediate;   Fair Recent;   Fair Remote;   Fair  Judgement:  Poor  Insight:  Lacking  Psychomotor Activity:  Normal  Concentration:  Concentration: Fair  Recall:  [employer] of Knowledge:  Fair  Language:  Fair  Akathisia:  No  Handed:    AIMS (if indicated):     Assets:  Communication Skills Desire for Improvement Housing Physical Health Resilience Social Support  ADL's:  Intact  Cognition:  WNL  Sleep:  Number of Hours: 3   Treatment Plan Summary: Daily contact with patient to assess and evaluate symptoms and progress in treatment and Medication management   -Continue with current treatment plan on 01/06/2018 as below except where it is noted.  -Schizophrenia vs  MDD with psychotic features             -Continue abilify 15mg  po qDay             -Continue lexapro 10mg  po qDay  -Anxiety             -Continue vistaril 50mg  po q6h prn anxiety  -agitation                      -Continue zydis 5mg  po q8h prn agitation                   -Continue geodon 20mg  IM q12h prn severe agitation  -insomnia              -Continue trazodone 50mg  po qhs prn insomnia  -Encourage participation in groups and therapeutic milieu -disposition planning will be ongoing  D[place]ill [place], NP 01/06/2018, 2:01 PM    ..Agree with NP Progress Note

## 2018-01-07 MED ORDER — ESCITALOPRAM OXALATE 5 MG PO TABS
15.0000 mg | ORAL_TABLET | Freq: Every day | ORAL | Status: DC
  Administered 2018-01-08 – 2018-01-10 (×3): 15 mg via ORAL
  Filled 2018-01-07 (×4): qty 3

## 2018-01-07 MED ORDER — TRAZODONE HCL 100 MG PO TABS: 100.0000 mg | ORAL_TABLET | Freq: Every evening | ORAL | Status: DC | PRN

## 2018-01-07 NOTE — Progress Notes (Signed)
DAR NOTE: Pt present with flat affect and depressed mood in the unit. Pt has been isolating herself and has been bed most of the time. Pt would come out for meds and meals after many prompts. Pt also with lots of encouragement, pt was to go outside for fresh air. Pt denies physical pain, took her meds as scheduled. As per self inventory, pt had a fair night sleep, good appetite, normal energy, and good concentration. Pt rate depression at 0, hopeless ness at 0. Pt's safety ensured with 15 minute and environmental checks. Pt currently denies SI/HI and A/V hallucinations. Pt verbally agrees to seek staff if SI/HI or A/VH occurs and to consult with staff before acting on these thoughts. Will continue POC.

## 2018-01-07 NOTE — Progress Notes (Addendum)
[facility] MD Progress Note  01/07/2018 1:23 PM Isabella [name]  MRN:  [id number] Subjective:   Isabella [name] seen resting in bed. Continue to presents flat, guarded and with short abrupt responses. Reports " I am okay" patient appears to be preoccupied with thoughts. Limited interaction and is not reactive with discussion. patient denies medication side effects. Reports not resting well at night, patient was agreeable to increasing trazodone 50 mg to 100 mg. Denies suicidal or homicidal ideations.Support and encouragement reassurance was provided.   Isabella [name] is a 42 y/o F with history of schizophrenia who was admitted voluntarily from [facility] where she presented with depression, neurovegetative symptoms, not eating/drinking, not caring for herself, and medication non-compliant for the past 2 weeks with possible trigger associated with death of pt's uncle 2 weeks ago. Pt was medically cleared and then transferred to [name] for additional treatment and stabilization. She was started on trial of abilify and lexapro. Pt has been having incremental improvement of her presenting symptoms.   Principal Problem: Schizophrenia ([place]) Diagnosis:   Patient Active Problem List   Diagnosis Date Noted  . Schizophrenia ([place]) [F20.9] 12/31/2017  . Social anxiety disorder [F40.10] 08/02/2016  . 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 Bipolar disorder ([place])   . Hypertension   . Migraine 07/13/2016  . Schizophrenia ([place])    History reviewed. No pertinent surgical history. Family History:  Family History  Problem Relation Age of Onset  . Mental illness Neg Hx    Family Psychiatric  History: see H&P Social History:  Social History   Substance and Sexual Activity  Alcohol Use No     Social History   Substance and Sexual Activity  Drug Use No    Social  History   Socioeconomic History  . Marital status: Single    Spouse name: Not on file  . Number of children: Not on file  . Years of education: Not on file  . Highest education level: Not on file  Occupational History  . Not on file  Social Needs  . Financial resource strain: Not on file  . Food insecurity:    Worry: Not on file    Inability: Not on file  . Transportation needs:    Medical: Not on file    Non-medical: Not on file  Tobacco Use  . Smoking status: Smoker, Current Status Unknown  . Smokeless tobacco: Never Used  Substance and Sexual Activity  . Alcohol use: No  . Drug use: No  . Sexual activity: Never  Lifestyle  . Physical activity:    Days per week: Not on file    Minutes per session: Not on file  . Stress: Not on file  Relationships  . Social connections:    Talks on phone: Not on file    Gets together: Not on file    Attends religious service: Not on file    Active member of club or organization: Not on file    Attends meetings of clubs or organizations: Not on file    Relationship status: Not on file  Other Topics Concern  . Not on file  Social History Narrative  . Not on file   Additional Social History:                         Sleep: Good  Appetite:  Good  Current Medications: Current Facility-Administered Medications  Medication Dose Route Frequency Provider Last Rate Last Dose  . acetaminophen (TYLENOL) tablet 650 mg  650 mg Oral Q6H PRN Isabella, [name], FNP      . alum & mag hydroxide-simeth (MAALOX/MYLANTA) 200-200-20 MG/5ML suspension 30 mL  30 mL Oral Q4H PRN Isabella, [name], FNP      . ARIPiprazole (ABILIFY) tablet 15 mg  15 mg Oral Daily Isabella Bracken, MD   15 mg at 01/07/18 0740  . escitalopram (LEXAPRO) tablet 10 mg  10 mg Oral Daily Isabella Bracken, MD   10 mg at 01/07/18 0740  . feeding supplement (ENSURE ENLIVE) (ENSURE ENLIVE) liquid 237 mL  237 mL Oral BID Isabella Derrill Center, NP   237 mL at  01/07/18 1018  . hydrOXYzine (ATARAX/VISTARIL) tablet 50 mg  50 mg Oral Q6H PRN Isabella Bracken, MD      . hyoscyamine (ANASPAZ) disintergrating tablet 0.125 mg  0.125 mg Oral Q4H PRN Isabella Bracken, MD   0.125 mg at 01/04/18 0820  . magnesium hydroxide (MILK OF MAGNESIA) suspension 30 mL  30 mL Oral Daily PRN Isabella, [name], FNP      . OLANZapine zydis (ZYPREXA) disintegrating tablet 5 mg  5 mg Oral Q8H PRN Isabella Bracken, MD       Or  . ziprasidone (GEODON) injection 20 mg  20 mg Intramuscular Q12H PRN Isabella Bracken, MD      . traZODone (DESYREL) tablet 50 mg  50 mg Oral QHS PRN Isabella, [name], FNP        Lab Results:  No results found for this or any previous visit (from the past 48 hour(s)).  Blood Alcohol level:  Lab Results  Component Value Date   ETH <10 12/29/2017   ETH <5 00/86/7619    Metabolic Disorder Labs: Lab Results  Component Value Date   HGBA1C 5.4 01/31/2016   MPG 108 01/31/2016   Lab Results  Component Value Date   PROLACTIN 124.2 (H) 01/31/2016   Lab Results  Component Value Date   CHOL 152 01/31/2016   TRIG 147 01/31/2016   HDL 39 (L) 01/31/2016   CHOLHDL 3.9 01/31/2016   VLDL 29 01/31/2016   LDLCALC 84 01/31/2016    Physical Findings: AIMS: Facial and Oral Movements Muscles of Facial Expression: None, normal Lips and Perioral Area: None, normal Jaw: None, normal Tongue: None, normal,Extremity Movements Upper (arms, wrists, hands, fingers): None, normal Lower (legs, knees, ankles, toes): None, normal, Trunk Movements Neck, shoulders, hips: None, normal, Overall Severity Severity of abnormal movements (highest score from questions above): None, normal Incapacitation due to abnormal movements: None, normal Patient's awareness of abnormal movements (rate only patient's report): No Awareness, Dental Status Current problems with teeth and/or dentures?: No Does patient usually wear dentures?: No  CIWA:     COWS:     Musculoskeletal: Strength & Muscle Tone: within normal limits Gait & Station: normal Patient leans: N/A  Psychiatric Specialty Exam: Physical Exam  Nursing note and vitals reviewed. Cardiovascular: Normal rate.  Neurological: She is alert.  Psychiatric: She has a normal mood and affect. Her behavior is normal.    Review of Systems  Psychiatric/Behavioral: Positive for depression. Negative for hallucinations and suicidal ideas. The patient is not nervous/anxious and does not have insomnia.   All other systems reviewed and are negative.   Blood pressure (!) 128/101, pulse (!) 104, temperature (!) 97.5 F (36.4  C), temperature source Oral, resp. rate 16, height 5\' 3"  (1.6 m), weight 83.9 kg, SpO2 96 %.Body mass index is 32.77 kg/m.  General Appearance: Casual and Fairly Groomed  Eye Contact:  Fair  Speech:  Clear and Coherent and Slow  Volume:  Decreased  Mood:  Anxious and Depressed  Affect:  Congruent, Constricted, Depressed and Flat  Thought Process:  Coherent and Goal Directed  Orientation:  Full (Time, Place, and Person)  Thought Content:  Logical  Suicidal Thoughts:  No  Homicidal Thoughts:  No  Memory:  Immediate;   Fair Recent;   Fair Remote;   Fair  Judgement:  Poor  Insight:  Lacking  Psychomotor Activity:  Normal  Concentration:  Concentration: Fair  Recall:  [employer] of Knowledge:  Fair  Language:  Fair  Akathisia:  No  Handed:    AIMS (if indicated):     Assets:  Communication Skills Desire for Improvement Housing Physical Health Resilience Social Support  ADL's:  Intact  Cognition:  WNL  Sleep:  Number of Hours: 6   Treatment Plan Summary: Daily contact with patient to assess and evaluate symptoms and progress in treatment and Medication management   -Continue with current treatment plan on 01/07/2018 as below except where it is noted.  -Schizophrenia vs MDD with psychotic features             -Continue Abilify 15mg  po qDay              -Continue lexapro 10mg  po qDay  -Anxiety             -Continue vistaril 50mg  po q6h prn anxiety  -agitation                      -Continue zydis 5mg  po q8h prn agitation                   -Continue geodon 20mg  IM q12h prn severe agitation  -insomnia              - Increased trazodone 50mg  to 100 mg po qhs prn insomnia  -Encourage participation in groups and therapeutic milieu -disposition planning will be ongoing  Derrill Center, NP 01/07/2018, 1:23 PM    ..Agree with NP Progress Note

## 2018-01-07 NOTE — BHH Group Notes (Signed)
[facility] LCSW Group Therapy Note  Date/Time:  01/07/2018  11:00AM-12:00PM  Type of Therapy and Topic:  Group Therapy:  Music and Mood  Participation Level:  Did Not Attend   Description of Group: In this process group, members listened to a variety of genres of music and identified that different types of music evoke different responses.  Patients were encouraged to identify music that was soothing for them and music that was energizing for them.  Patients discussed how this knowledge can help with wellness and recovery in various ways including managing depression and anxiety as well as encouraging healthy sleep habits.    Therapeutic Goals: 1. Patients will explore the impact of different varieties of music on mood 2. Patients will verbalize the thoughts they have when listening to different types of music 3. Patients will identify music that is soothing to them as well as music that is energizing to them 4. Patients will discuss how to use this knowledge to assist in maintaining wellness and recovery 5. Patients will explore the use of music as a coping skill  Summary of Patient Progress:  N/A  Therapeutic Modalities: Solution Focused Brief Therapy Activity   Selmer Dominion, LCSW

## 2018-01-08 NOTE — Progress Notes (Signed)
[facility] MD Progress Note  01/08/2018 12:58 PM Isabella [name]  MRN:  [id number] Subjective:    Isabella [name] is a 42 y/o F with history of schizophrenia who was admitted voluntarily from [place] where she presented with depression, neurovegetative symptoms, not eating/drinking, not caring for herself, and medication non-compliant for the past 2 weeks with possible trigger associated with death of pt's uncle 2 weeks ago. Pt was medically cleared and then transferred to [facility] for additional treatment and stabilization.She was started on trial of abilify and lexapro. Pt has been having incremental improvement of her presenting symptoms.  Today upon evaluation, pt is in bed, and she has poor eye contact during interview. She shares, "I don't feel good." She denies physical complaints, and she clarifies, "I feel depressed." She indicates some minor improvement during her stay, but she is unable to quantify how much improvement she has noticed. She denies SI/HI/AH/VH. She is sleeping adequately, as she naps during the day often.Her appetite is poor and she has not been going to the cafeteria regularly. Discussed with patient that expectation is that she will eat all meals of the day to get adequate nutrition, and she verbalized good understanding. Discussed with patient that we will continue to encourage her to get out of bed and to participate in the therapeutic milieu. Discussed with patient about alternative option of trial of ECT to address her ongoing depression symptoms. Encouraged pt to continue her full effort to participate in groups and the milieu, and she is in agreement to continue on her current regimen without changes. We continue to consider ECT as a back up option if pt continues to struggle to get adequate nutrition. She had no further questions, comments, or concerns.  Principal Problem: Schizophrenia ([place]) Diagnosis:   Patient Active Problem List   Diagnosis Date Noted  . Schizophrenia ([place])  [F20.9] 12/31/2017  . Social anxiety disorder [F40.10] 08/02/2016  . 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 Bipolar disorder ([place])   . Hypertension   . Migraine 07/13/2016  . Schizophrenia ([place])    History reviewed. No pertinent surgical history. Family History:  Family History  Problem Relation Age of Onset  . Mental illness Neg Hx    Family Psychiatric  History: see H&P Social History:  Social History   Substance and Sexual Activity  Alcohol Use No     Social History   Substance and Sexual Activity  Drug Use No    Social History   Socioeconomic History  . Marital status: Single    Spouse name: Not on file  . Number of children: Not on file  . Years of education: Not on file  . Highest education level: Not on file  Occupational History  . Not on file  Social Needs  . Financial resource strain: Not on file  . Food insecurity:    Worry: Not on file    Inability: Not on file  . Transportation needs:    Medical: Not on file    Non-medical: Not on file  Tobacco Use  . Smoking status: Smoker, Current Status Unknown  . Smokeless tobacco: Never Used  Substance and Sexual Activity  . Alcohol use: No  . Drug use: No  . Sexual activity: Never  Lifestyle  . Physical activity:    Days per week: Not on file  Minutes per session: Not on file  . Stress: Not on file  Relationships  . Social connections:    Talks on phone: Not on file    Gets together: Not on file    Attends religious service: Not on file    Active member of club or organization: Not on file    Attends meetings of clubs or organizations: Not on file    Relationship status: Not on file  Other Topics Concern  . Not on file  Social History Narrative  . Not on file   Additional Social History:                         Sleep:  Fair  Appetite:  Poor  Current Medications: Current Facility-Administered Medications  Medication Dose Route Frequency Provider Last Rate Last Dose  . acetaminophen (TYLENOL) tablet 650 mg  650 mg Oral Q6H PRN Money, [name], FNP      . alum & mag hydroxide-simeth (MAALOX/MYLANTA) 200-200-20 MG/5ML suspension 30 mL  30 mL Oral Q4H PRN Money, [name], FNP      . ARIPiprazole (ABILIFY) tablet 15 mg  15 mg Oral Daily [name], MD   15 mg at 01/08/18 0747  . escitalopram (LEXAPRO) tablet 15 mg  15 mg Oral Daily Derrill Center, NP   15 mg at 01/08/18 0747  . feeding supplement (ENSURE ENLIVE) (ENSURE ENLIVE) liquid 237 mL  237 mL Oral BID [name], NP   237 mL at 01/08/18 0956  . hydrOXYzine (ATARAX/VISTARIL) tablet 50 mg  50 mg Oral Q6H PRN [name], MD      . hyoscyamine (ANASPAZ) disintergrating tablet 0.125 mg  0.125 mg Oral Q4H PRN [name], MD   0.125 mg at 01/04/18 0820  . magnesium hydroxide (MILK OF MAGNESIA) suspension 30 mL  30 mL Oral Daily PRN Money, [name], FNP      . OLANZapine zydis (ZYPREXA) disintegrating tablet 5 mg  5 mg Oral Q8H PRN [name], MD       Or  . ziprasidone (GEODON) injection 20 mg  20 mg Intramuscular Q12H PRN [name], MD      . traZODone (DESYREL) tablet 100 mg  100 mg Oral QHS PRN Derrill Center, NP        Lab Results: No results found for this or any previous visit (from the past 57 hour(s)).  Blood Alcohol level:  Lab Results  Component Value Date   ETH <10 12/29/2017   ETH <5 26/83/4196    Metabolic Disorder Labs: Lab Results  Component Value Date   HGBA1C 5.4 01/31/2016   MPG 108 01/31/2016   Lab Results  Component Value Date   PROLACTIN 124.2 (H) 01/31/2016   Lab Results  Component Value Date   CHOL 152 01/31/2016   TRIG 147 01/31/2016   HDL 39 (L) 01/31/2016   CHOLHDL 3.9 01/31/2016   VLDL 29 01/31/2016   LDLCALC 84 01/31/2016     Physical Findings: AIMS: Facial and Oral Movements Muscles of Facial Expression: None, normal Lips and Perioral Area: None, normal Jaw: None, normal Tongue: None, normal,Extremity Movements Upper (arms, wrists, hands, fingers): None, normal Lower (legs, knees, ankles, toes): None, normal, Trunk Movements Neck, shoulders, hips: None, normal, Overall Severity Severity of abnormal movements (highest score from questions above): None, normal Incapacitation due to abnormal movements: None, normal Patient's awareness of abnormal movements (rate only patient's report):  No Awareness, Dental Status Current problems with teeth and/or dentures?: No Does patient usually wear dentures?: No  CIWA:    COWS:     Musculoskeletal: Strength & Muscle Tone: within normal limits Gait & Station: normal Patient leans: N/A  Psychiatric Specialty Exam: Physical Exam  Nursing note and vitals reviewed.   Review of Systems  Constitutional: Positive for malaise/fatigue. Negative for chills and fever.  Respiratory: Negative for cough and shortness of breath.   Cardiovascular: Negative for chest pain.  Gastrointestinal: Negative for abdominal pain, heartburn, nausea and vomiting.  Psychiatric/Behavioral: Positive for depression. Negative for hallucinations and suicidal ideas. The patient has insomnia. The patient is not nervous/anxious.     Blood pressure (!) 128/101, pulse (!) 104, temperature (!) 97.5 F (36.4 C), temperature source Oral, resp. rate 16, height 5\' 3"  (1.6 m), weight 83.9 kg, SpO2 96 %.Body mass index is 32.77 kg/m.  General Appearance: Casual and Fairly Groomed  Eye Contact:  Minimal  Speech:  Clear and Coherent and Slow  Volume:  Normal  Mood:  Anxious and Depressed  Affect:  Appropriate, Congruent, Constricted, Depressed and Flat  Thought Process:  Coherent and Goal Directed  Orientation:  Full (Time, Place, and Person)  Thought Content:  Logical  Suicidal Thoughts:  No   Homicidal Thoughts:  No  Memory:  Immediate;   Fair Recent;   Fair Remote;   Fair  Judgement:  Poor  Insight:  Lacking  Psychomotor Activity:  Normal  Concentration:  Concentration: Fair  Recall:  [employer] of Knowledge:  Fair  Language:  Fair  Akathisia:  No  Handed:    AIMS (if indicated):     Assets:  Resilience Social Support  ADL's:  Intact  Cognition:  WNL  Sleep:  Number of Hours: 4.25   Treatment Plan Summary: Daily contact with patient to assess and evaluate symptoms and progress in treatment and Medication management   -Continue inpatient hospitalization  -Will continue today8/26/2019plan as below except where it is noted.  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

## 2018-01-08 NOTE — Plan of Care (Signed)
  Problem: Activity: Goal: Interest or engagement in activities will improve Outcome: Not Progressing   Problem: Safety: Goal: Periods of time without injury will increase Outcome: Progressing   Problem: Activity: Goal: Interest or engagement in leisure activities will improve Outcome: Not Progressing   Problem: Coping: Goal: Coping ability will improve Outcome: Not Progressing   Problem: Medication: Goal: Compliance with prescribed medication regimen will improve Outcome: Progressing  DAR NOTE: Patient presents with flat affect and depressed mood.  Denies suicidal thoughts, auditory and visual hallucinations.  Rates depression  a little bit, hopelessness at 0, and anxiety at 0.  Maintained on routine safety checks.  Medications given as prescribed.  Support and encouragement offered as needed.  Attended group and participated.  Patient visible in milieu with no interactions.  Patient is safe on the unit.

## 2018-01-08 NOTE — BHH Group Notes (Signed)
LCSW Group Therapy Note   01/08/2018 1:15pm   Type of Therapy and Topic:  Group Therapy:  Overcoming Obstacles   Participation Level:  None   Description of Group:    In this group patients will be encouraged to explore what they see as obstacles to their own wellness and recovery. They will be guided to discuss their thoughts, feelings, and behaviors related to these obstacles. The group will process together ways to cope with barriers, with attention given to specific choices patients can make. Each patient will be challenged to identify changes they are motivated to make in order to overcome their obstacles. This group will be process-oriented, with patients participating in exploration of their own experiences as well as giving and receiving support and challenge from other group members.   Therapeutic Goals: 1. Patient will identify personal and current obstacles as they relate to admission. 2. Patient will identify barriers that currently interfere with their wellness or overcoming obstacles.  3. Patient will identify feelings, thought process and behaviors related to these barriers. 4. Patient will identify two changes they are willing to make to overcome these obstacles:      Summary of Patient Progress  Came late when the fire alarm went off.  Stayed for the rest of group, but did not contribute.      Therapeutic Modalities:   Cognitive Behavioral Therapy Solution Focused Therapy Motivational Interviewing Relapse Prevention Therapy  Trish Mage, LCSW 01/08/2018 4:39 PM

## 2018-01-08 NOTE — Progress Notes (Signed)
Recreation Therapy Notes  Date: 8.26.19 Time: 1000 Location: 500 Hall Dayroom  Group Topic: Coping Skills  Goal Area(s) Addresses:  Patient will be able to identify positive coping skills. Patient will be able to identify benefit of using coping skills post d/c.  Intervention: Coloring  Activity: Coping Skills.  LRT discussed various coping skills with patients.  Patients then completed that corresponded with their respective coping skills.  Education: Radiographer, therapeutic, Dentist.   Education Outcome: Acknowledges understanding/In group clarification offered/Needs additional education.   Clinical Observations/Feedback: Pt did not attend group.    Victorino Sparrow, LRT/CTRS         Ria Comment, Ebelyn Bohnet A 01/08/2018 1:01 PM

## 2018-01-08 NOTE — Progress Notes (Signed)
Patient has been isolative to her room tonight. Writer spoke with her mother and sister who came to visit and they are concerned that she is not eating her meals and very little fluid intake. Writer spoke with patient and encouraged her to eat something and she declined. She was given gatorade to drink and her mother aided with getting her to drink it. She was encouraged to come to the dayroom instead of lying in bed after visitation but she did not come out of her room. She remains flat, blunted and little interaction with staff other than nodding yes or no and speaks in a very soft, quiet voice which at times is difficult to understand. Safety maintained on unit with 15 min checks.

## 2018-01-09 ENCOUNTER — Inpatient Hospital Stay: Admission: RE | Admit: 2018-01-09 | Payer: Medicaid Other | Source: Intra-hospital | Admitting: Psychiatry

## 2018-01-09 MED ORDER — ARIPIPRAZOLE 10 MG PO TABS
20.0000 mg | ORAL_TABLET | Freq: Every day | ORAL | Status: DC
  Administered 2018-01-10: 20 mg via ORAL
  Filled 2018-01-09 (×2): qty 2

## 2018-01-09 NOTE — BH Assessment (Signed)
__________PENDING APPROVAL FROM PT'S DR.________  Patient has been accepted to [facility] is Dr. Weber Cooks.  Attending Physician will be Dr. Weber Cooks.  Patient has been assigned to room 303-A, by [place] [facility].  Call report to [phone].  Representative/Transfer Coordinator is Regulatory affairs officer Patient pre-admitted by [facility] Patient Access Elberta Fortis)   [facility] [place], [place] ) made aware of acceptance.

## 2018-01-09 NOTE — Progress Notes (Signed)
Recreation Therapy Notes  Date: 8.27.19 Time: 1000 Location: 500 Hall Dayroom  Group Topic: Self-Esteem  Goal Area(s) Addresses:  Patient will successfully identify positive attributes about themselves.  Patient will successfully identify benefit of improved self-esteem.   Intervention: Worksheets, markers  Activity: Self-esteem/Positive Affirmations.  LRT introduced self-esteem to patients.  Patients were given a piece of construction paper.  Patients wrote their names on the paper and passed it to the left.  Each person got a chance to write something positive about their peers.  Patients then completed an "I Like Myself A-Z" worksheet.  Education:  Self-Esteem, Dentist.   Education Outcome: Acknowledges education/In group clarification offered/Needs additional education  Clinical Observations/Feedback: Pt did not attend group.    Victorino Sparrow, LRT/CTRS         Ria Comment, Kimberl Vig A 01/09/2018 11:20 AM

## 2018-01-09 NOTE — Tx Team (Signed)
Interdisciplinary Treatment and Diagnostic Plan Update  01/09/2018 Time of Session: 9:39 AM  Isabella [name] MRN: [id number]  Principal Diagnosis: Schizophrenia [facility])  Secondary Diagnoses: Principal Problem:   Schizophrenia ([place])   Current Medications:  Current Facility-Administered Medications  Medication Dose Route Frequency Provider Last Rate Last Dose  . acetaminophen (TYLENOL) tablet 650 mg  650 mg Oral Q6H PRN Money, [name], FNP      . alum & mag hydroxide-simeth (MAALOX/MYLANTA) 200-200-20 MG/5ML suspension 30 mL  30 mL Oral Q4H PRN Money, [name], FNP      . ARIPiprazole (ABILIFY) tablet 15 mg  15 mg Oral Daily [name], MD   15 mg at 01/09/18 0754  . escitalopram (LEXAPRO) tablet 15 mg  15 mg Oral Daily Derrill Center, NP   15 mg at 01/09/18 0754  . feeding supplement (ENSURE ENLIVE) (ENSURE ENLIVE) liquid 237 mL  237 mL Oral BID [name], NP   237 mL at 01/08/18 1459  . hydrOXYzine (ATARAX/VISTARIL) tablet 50 mg  50 mg Oral Q6H PRN [name], MD      . hyoscyamine (ANASPAZ) disintergrating tablet 0.125 mg  0.125 mg Oral Q4H PRN [name], MD   0.125 mg at 01/04/18 0820  . magnesium hydroxide (MILK OF MAGNESIA) suspension 30 mL  30 mL Oral Daily PRN Money, [name], FNP      . OLANZapine zydis (ZYPREXA) disintegrating tablet 5 mg  5 mg Oral Q8H PRN [name], MD       Or  . ziprasidone (GEODON) injection 20 mg  20 mg Intramuscular Q12H PRN [name], MD      . traZODone (DESYREL) tablet 100 mg  100 mg Oral QHS PRN Derrill Center, NP        PTA Medications: Medications Prior to Admission  Medication Sig Dispense Refill Last Dose  . benztropine (COGENTIN) 0.5 MG tablet Take 1 tablet (0.5 mg total) by mouth at bedtime. (Patient not taking: Reported on 12/30/2017) 30 tablet 0 Not Taking at Unknown time  . clonazePAM (KLONOPIN) 0.5 MG tablet Take 0.5 tablets (0.25 mg total) by mouth 2 (two)  times daily. Please instruct patient to cut the tablet in half. (Patient not taking: Reported on 12/30/2017) 30 tablet 0 Not Taking at Unknown time  . doxepin (SINEQUAN) 25 MG capsule Take 1 capsule (25 mg total) by mouth at bedtime. (Patient not taking: Reported on 12/30/2017) 30 capsule 0 Not Taking at Unknown time  . hydrOXYzine (ATARAX/VISTARIL) 25 MG tablet Take 1 tablet (25 mg total) by mouth every 6 (six) hours as needed for anxiety. (Patient not taking: Reported on 12/30/2017) 30 tablet 0 Not Taking at Unknown time  . hyoscyamine (LEVSIN SL) 0.125 MG SL tablet Place 0.125 mg under the tongue every 4 (four) hours as needed for cramping. For 10 days  0 12/26/2017  . losartan (COZAAR) 25 MG tablet Take 1 tablet (25 mg total) by mouth daily. (Patient not taking: Reported on 12/30/2017) 30 tablet 0 Not Taking at Unknown time  . megestrol (MEGACE) 400 MG/10ML suspension Take 10 mLs (400 mg total) by mouth 2 (two) times daily. (Patient not taking: Reported on 12/30/2017) 240 mL 0 Not Taking at Unknown time  . OLANZapine (ZYPREXA) 2.5 MG tablet Take 5 tablets (12.5 mg total) by mouth at bedtime. (Patient not taking: Reported on 12/30/2017) 150 tablet 0 Not Taking at Unknown time  . propranolol (INDERAL) 10 MG tablet Take 1 tablet (  10 mg total) by mouth 2 (two) times daily. (Patient not taking: Reported on 12/30/2017) 60 tablet 0 Not Taking at Unknown time  . traZODone (DESYREL) 50 MG tablet Take 1 tablet (50 mg total) by mouth at bedtime as needed for sleep. (Patient not taking: Reported on 12/30/2017) 30 tablet 0 Not Taking at Unknown time  . venlafaxine XR (EFFEXOR-XR) 75 MG 24 hr capsule Take 3 capsules (225 mg total) by mouth daily with breakfast. (Patient not taking: Reported on 12/30/2017) 90 capsule 0 Not Taking at Unknown time    Patient Stressors: Other: mental health problems  Patient Strengths: Average or above average intelligence Physical Health Supportive family/friends  Treatment Modalities:  Medication Management, Group therapy, Case management,  1 to 1 session with clinician, Psychoeducation, Recreational therapy.   Physician Treatment Plan for Primary Diagnosis: Schizophrenia ([place]) Long Term Goal(s): Improvement in symptoms so as ready for discharge  Short Term Goals: Ability to identify and develop effective coping behaviors will improve Ability to demonstrate self-control will improve  Medication Management: Evaluate patient's response, side effects, and tolerance of medication regimen.  Therapeutic Interventions: 1 to 1 sessions, Unit Group sessions and Medication administration.  Evaluation of Outcomes: Progressing   8/22: "I have been feeling depressed for a long time & I'm still feeling very depressed. 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  Flat affect, grim, isolating, Dr will talk to patient about ECT today  Physician Treatment Plan for Secondary Diagnosis: Principal Problem:   [place] Term Goal(s): Improvement in symptoms so as ready for discharge  Short Term Goals: Ability to identify and develop effective coping behaviors will improve Ability to demonstrate self-control will improve  Medication Management: Evaluate patient's response, side effects, and tolerance of medication regimen.  Therapeutic Interventions: 1 to 1 sessions, Unit Group sessions and Medication administration.  Evaluation of Outcomes: Progressing   RN Treatment Plan for Primary Diagnosis: [place] Term Goal(s): Knowledge of disease and therapeutic regimen to maintain health will improve  Short Term Goals: Ability to identify and develop effective coping behaviors will improve and Compliance with prescribed  medications will improve  Medication Management: RN will administer medications as ordered by provider, will assess and evaluate patient's response and provide education to patient for prescribed medication. RN will report any adverse and/or side effects to prescribing provider.  Therapeutic Interventions: 1 on 1 counseling sessions, Psychoeducation, Medication administration, Evaluate responses to treatment, Monitor vital signs and CBGs as ordered, Perform/monitor CIWA, COWS, AIMS and Fall Risk screenings as ordered, Perform wound care treatments as ordered.  Evaluation of Outcomes: Progressing   LCSW Treatment Plan for Primary Diagnosis: [place]izophrenia ([place] Term Goal(s): Safe transition to appropriate next level of care at discharge, Engage patient in therapeutic group addressing interpersonal concerns.  Short Term Goals: Engage patient in aftercare planning with referrals and resources  Therapeutic Interventions: Assess for all discharge needs, 1 to 1 time with Social worker, Explore available resources and support systems, Assess for adequacy in community support network, Educate family and significant other(s) on suicide prevention, Complete Psychosocial Assessment, Interpersonal  group therapy.  Evaluation of Outcomes: Met  Return home, follow up [place] in Treatment: Attending groups: No Participating in groups: No Taking medication as prescribed: Yes Toleration medication: Yes, no side effects reported at this time Family/Significant other contact made: No Patient understands diagnosis: No Limited insight Discussing patient identified problems/goals with staff: Yes Medical problems stabilized or resolved: Yes Denies suicidal/homicidal ideation: Yes Issues/concerns per patient self-inventory: None Other: N/A  New problem(s) identified: None identified at this time.   New Short Term/Long Term Goal(s): "I feel sad, and I sleep too much."  Discharge Plan or  Barriers:   Reason for Continuation of Hospitalization:  Delusions  Depression  Medication stabilization Suicidal ideation   Estimated Length of Stay: 8/30  Attendees: Patient:  01/09/2018  9:39 AM  Physician: Maris Berger, MD 01/09/2018  9:39 AM  Nursing: Sena Hitch, RN 01/09/2018  9:39 AM  RN Care Manager: Lars Pinks, RN 01/09/2018  9:39 AM  Social Worker: Ripley Fraise 01/09/2018  9:39 AM  Recreational Therapist: Winfield Cunas 01/09/2018  9:39 AM  Other: Norberto Sorenson 01/09/2018  9:39 AM  Other:  01/09/2018  9:39 AM    Scribe for Treatment Team:  Roque Lias LCSW 01/09/2018 9:39 AM

## 2018-01-09 NOTE — Plan of Care (Signed)
  Problem: Activity: Goal: Interest or engagement in activities will improve Outcome: Not Progressing   Problem: Safety: Goal: Periods of time without injury will increase Outcome: Progressing  DAR NOTE: Patient presents with flat affect and depressed mood.  Denies suicidal thoughts, pain, auditory and visual hallucinations.  Remained withdrawn and isolates to her room.  Described energy level as low and concentration as poor.  Rates depression at 0, hopelessness at 0, and anxiety at 0.  Maintained on routine safety checks.  Medications given as prescribed.  Support and encouragement offered as needed.  .  Patient visible in milieu after encouragements.  No interaction with staff or peers.  Food and fluid intake remained poor.

## 2018-01-09 NOTE — Progress Notes (Signed)
[facility] MD Progress Note  01/09/2018 1:13 PM Isabella [name]  MRN:  [id number] Subjective:    Isabella [name] is a 42 y/o F with history of schizophrenia who was admitted voluntarily from [place] where she presented with depression, neurovegetative symptoms, not eating/drinking, not caring for herself, and medication non-compliant for the past 2 weeks with possible trigger associated with death of pt's uncle 2 weeks ago. Pt was medically cleared and then transferred to [facility] for additional treatment and stabilization.She was started on trial of abilify and lexapro, and doses have been titrated up during her stay.Pt has been having incremental improvement of her presenting symptoms.  Today upon evaluation, pt was interviewed in her room. She remains in bed for the majority of the day, and she only leaves bed with significant encouragement from RN staff. She has not been eating at meal time for the past day, and she will only take a small amount of food with significant encouragement from staff. Today upon interview, pt shares, "I feel really bad." She denies any physical complaints. She continues to identify severe depression as her only concern. She reports some minimal improvement of her symptoms during her stay. She denies SI/HI/AH/VH. She is sleeping well. Discussed with patient regarding importance of adequate nutrition, and pt shares about her desire to eat, "I just don't feel like it." Reviewed with patient that even though she is reporting incremental improvement, there is significant concern with her poor intake of nutrition. Discussed with patient about option for referral to ECT, and pt was in agreement. We will begin to look into that option and availability of ECT. Pt was in agreement to increase dose of abilify and continue her other medications as directed. She had no further questions, comments, or concerns.  Principal Problem: Schizophrenia ([place]) Diagnosis:   Patient Active Problem List   Diagnosis Date Noted  . Schizophrenia ([place]) [F20.9] 12/31/2017  . Social anxiety disorder [F40.10] 08/02/2016  . 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 Bipolar disorder ([place])   . Hypertension   . Migraine 07/13/2016  . Schizophrenia ([place])    History reviewed. No pertinent surgical history. Family History:  Family History  Problem Relation Age of Onset  . Mental illness Neg Hx    Family Psychiatric  History: see H&P Social History:  Social History   Substance and Sexual Activity  Alcohol Use No     Social History   Substance and Sexual Activity  Drug Use No    Social History   Socioeconomic History  . Marital status: Single    Spouse name: Not on file  . Number of children: Not on file  . Years of education: Not on file  . Highest education level: Not on file  Occupational History  . Not on file  Social Needs  . Financial resource strain: Not on file  . Food insecurity:    Worry: Not on file    Inability: Not on file  . Transportation needs:    Medical: Not on file    Non-medical: Not on file  Tobacco Use  . Smoking status: Smoker, Current Status Unknown  . Smokeless tobacco: Never Used  Substance and Sexual Activity  . Alcohol use: No  . Drug use: No  . Sexual activity: Never  Lifestyle  . Physical activity:    Days per week: Not  on file    Minutes per session: Not on file  . Stress: Not on file  Relationships  . Social connections:    Talks on phone: Not on file    Gets together: Not on file    Attends religious service: Not on file    Active member of club or organization: Not on file    Attends meetings of clubs or organizations: Not on file    Relationship status: Not on file  Other Topics Concern  . Not on file  Social History Narrative  . Not on file   Additional Social  History:                         Sleep: Good  Appetite:  Poor  Current Medications: Current Facility-Administered Medications  Medication Dose Route Frequency Provider Last Rate Last Dose  . acetaminophen (TYLENOL) tablet 650 mg  650 mg Oral Q6H PRN Money, [name], FNP      . alum & mag hydroxide-simeth (MAALOX/MYLANTA) 200-200-20 MG/5ML suspension 30 mL  30 mL Oral Q4H PRN Money, [name], FNP      . ARIPiprazole (ABILIFY) tablet 15 mg  15 mg Oral Daily [name], MD   15 mg at 01/09/18 0754  . escitalopram (LEXAPRO) tablet 15 mg  15 mg Oral Daily Derrill Center, NP   15 mg at 01/09/18 0754  . feeding supplement (ENSURE ENLIVE) (ENSURE ENLIVE) liquid 237 mL  237 mL Oral BID [name], NP   237 mL at 01/09/18 0946  . hydrOXYzine (ATARAX/VISTARIL) tablet 50 mg  50 mg Oral Q6H PRN [name], MD      . hyoscyamine (ANASPAZ) disintergrating tablet 0.125 mg  0.125 mg Oral Q4H PRN [name], MD   0.125 mg at 01/04/18 0820  . magnesium hydroxide (MILK OF MAGNESIA) suspension 30 mL  30 mL Oral Daily PRN Money, [name], FNP      . OLANZapine zydis (ZYPREXA) disintegrating tablet 5 mg  5 mg Oral Q8H PRN [name], MD       Or  . ziprasidone (GEODON) injection 20 mg  20 mg Intramuscular Q12H PRN [name], MD      . traZODone (DESYREL) tablet 100 mg  100 mg Oral QHS PRN Derrill Center, NP        Lab Results: No results found for this or any previous visit (from the past 60 hour(s)).  Blood Alcohol level:  Lab Results  Component Value Date   ETH <10 12/29/2017   ETH <5 18/29/9371    Metabolic Disorder Labs: Lab Results  Component Value Date   HGBA1C 5.4 01/31/2016   MPG 108 01/31/2016   Lab Results  Component Value Date   PROLACTIN 124.2 (H) 01/31/2016   Lab Results  Component Value Date   CHOL 152 01/31/2016   TRIG 147 01/31/2016   HDL 39 (L) 01/31/2016   CHOLHDL 3.9 01/31/2016    VLDL 29 01/31/2016   LDLCALC 84 01/31/2016    Physical Findings: AIMS: Facial and Oral Movements Muscles of Facial Expression: None, normal Lips and Perioral Area: None, normal Jaw: None, normal Tongue: None, normal,Extremity Movements Upper (arms, wrists, hands, fingers): None, normal Lower (legs, knees, ankles, toes): None, normal, Trunk Movements Neck, shoulders, hips: None, normal, Overall Severity Severity of abnormal movements (highest score from questions above): None, normal Incapacitation due to abnormal movements: None, normal Patient's awareness of abnormal  movements (rate only patient's report): No Awareness, Dental Status Current problems with teeth and/or dentures?: No Does patient usually wear dentures?: No  CIWA:    COWS:     Musculoskeletal: Strength & Muscle Tone: within normal limits Gait & Station: normal Patient leans: N/A  Psychiatric Specialty Exam: Physical Exam  Nursing note and vitals reviewed.   Review of Systems  Constitutional: Negative for chills and fever.  Respiratory: Negative for cough and shortness of breath.   Cardiovascular: Negative for chest pain.  Gastrointestinal: Negative for abdominal pain, heartburn, nausea and vomiting.  Psychiatric/Behavioral: Negative for depression, hallucinations and suicidal ideas. The patient is not nervous/anxious and does not have insomnia.     Blood pressure (!) 128/101, pulse (!) 104, temperature (!) 97.5 F (36.4 C), temperature source Oral, resp. rate 16, height 5\' 3"  (1.6 m), weight 83.9 kg, SpO2 96 %.Body mass index is 32.77 kg/m.  General Appearance: Casual and Fairly Groomed  Eye Contact:  Minimal  Speech:  Slow  Volume:  Decreased  Mood:  Anxious and Depressed  Affect:  Congruent, Depressed and Flat  Thought Process:  Coherent  Orientation:  Full (Time, Place, and Person)  Thought Content:  Logical  Suicidal Thoughts:  No  Homicidal Thoughts:  No  Memory:  Immediate;   Fair Recent;    Fair Remote;   Fair  Judgement:  Poor  Insight:  Lacking  Psychomotor Activity:  Normal  Concentration:  Concentration: Fair  Recall:  [employer] of Knowledge:  Fair  Language:  Fair  Akathisia:  No  Handed:    AIMS (if indicated):     Assets:  Resilience Social Support  ADL's:  Intact  Cognition:  WNL  Sleep:  Number of Hours: 6.25   Treatment Plan Summary: Daily contact with patient to assess and evaluate symptoms and progress in treatment and Medication management   -Continue inpatient hospitalization  -Will continue today8/27/2019plan as below except where it is noted.  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

## 2018-01-09 NOTE — Consult Note (Signed)
ECT: Consult received and chart reviewed. PAtient is an appropriate candidate for ECT based on what is documented. If she is agreeable to ECT please proceed with transfer. THANKS!

## 2018-01-09 NOTE — [facility] Group Notes (Signed)
LCSW Group Therapy Note   01/09/2018 1:15pm   Type of Therapy and Topic:  Group Therapy:  Positive Affirmations   Participation Level:  Did Not Attend  Description of Group: This group addressed positive affirmation toward self and others. Patients went around the room and identified two positive things about themselves and two positive things about a peer in the room. Patients reflected on how it felt to share something positive with others, to identify positive things about themselves, and to hear positive things from others. Patients were encouraged to have a daily reflection of positive characteristics or circumstances.  Therapeutic Goals 1. Patient will verbalize two of their positive qualities 2. Patient will demonstrate empathy for others by stating two positive qualities about a peer in the group 3. Patient will verbalize their feelings when voicing positive self affirmations and when voicing positive affirmations of others 4. Patients will discuss the potential positive impact on their wellness/recovery of focusing on positive traits of self and others. Summary of Patient Progress:    Therapeutic Modalities Cognitive [place], [place] 01/09/2018 2:24 PM

## 2018-01-09 NOTE — [facility] Counselor (Signed)
Called Rosendo Gros at [facility] with referral for ECT.

## 2018-01-10 ENCOUNTER — Other Ambulatory Visit: Payer: Self-pay

## 2018-01-10 ENCOUNTER — Inpatient Hospital Stay
Admission: AD | Admit: 2018-01-10 | Discharge: 2018-01-29 | DRG: 885 | Disposition: A | Discharge status: Home / Self Care | Payer: Medicaid Other | Source: Intra-hospital | Attending: Psychiatry | Admitting: Psychiatry

## 2018-01-10 DIAGNOSIS — I10 Essential (primary) hypertension: Secondary | ICD-10-CM | POA: Diagnosis present

## 2018-01-10 DIAGNOSIS — F32A Depression, unspecified: Secondary | ICD-10-CM

## 2018-01-10 DIAGNOSIS — F209 Schizophrenia, unspecified: Secondary | ICD-10-CM | POA: Diagnosis present

## 2018-01-10 DIAGNOSIS — Z79891 Long term (current) use of opiate analgesic: Secondary | ICD-10-CM

## 2018-01-10 DIAGNOSIS — F4010 Social phobia, unspecified: Secondary | ICD-10-CM | POA: Diagnosis present

## 2018-01-10 DIAGNOSIS — Z79899 Other long term (current) drug therapy: Secondary | ICD-10-CM | POA: Diagnosis not present

## 2018-01-10 DIAGNOSIS — R630 Anorexia: Secondary | ICD-10-CM

## 2018-01-10 DIAGNOSIS — R41843 Psychomotor deficit: Secondary | ICD-10-CM | POA: Diagnosis present

## 2018-01-10 DIAGNOSIS — F329 Major depressive disorder, single episode, unspecified: Secondary | ICD-10-CM

## 2018-01-10 DIAGNOSIS — E860 Dehydration: Secondary | ICD-10-CM

## 2018-01-10 DIAGNOSIS — F339 Major depressive disorder, recurrent, unspecified: Secondary | ICD-10-CM | POA: Diagnosis present

## 2018-01-10 DIAGNOSIS — F333 Major depressive disorder, recurrent, severe with psychotic symptoms: Secondary | ICD-10-CM | POA: Diagnosis not present

## 2018-01-10 DIAGNOSIS — Z01810 Encounter for preprocedural cardiovascular examination: Secondary | ICD-10-CM | POA: Diagnosis not present

## 2018-01-10 DIAGNOSIS — F17210 Nicotine dependence, cigarettes, uncomplicated: Secondary | ICD-10-CM | POA: Diagnosis present

## 2018-01-10 DIAGNOSIS — Z9114 Patient's other noncompliance with medication regimen: Secondary | ICD-10-CM | POA: Diagnosis not present

## 2018-01-10 DIAGNOSIS — F202 Catatonic schizophrenia: Secondary | ICD-10-CM | POA: Diagnosis present

## 2018-01-10 MED ORDER — ACETAMINOPHEN 325 MG PO TABS: 650.0000 mg | ORAL_TABLET | Freq: Four times a day (QID) | ORAL | Status: DC | PRN

## 2018-01-10 MED ORDER — HYOSCYAMINE SULFATE 0.125 MG PO TBDP: 0.1250 mg | ORAL_TABLET | ORAL | 0 refills | Status: DC | PRN

## 2018-01-10 MED ORDER — ARIPIPRAZOLE 20 MG PO TABS: 20.0000 mg | ORAL_TABLET | Freq: Every day | ORAL | Status: DC

## 2018-01-10 MED ORDER — ESCITALOPRAM OXALATE 5 MG PO TABS: 15.0000 mg | ORAL_TABLET | Freq: Every day | ORAL | Status: DC

## 2018-01-10 MED ORDER — MAGNESIUM HYDROXIDE 400 MG/5ML PO SUSP: 30.0000 mL | Freq: Every day | ORAL | 0 refills | Status: DC | PRN

## 2018-01-10 MED ORDER — MAGNESIUM HYDROXIDE 400 MG/5ML PO SUSP
30.0000 mL | Freq: Every day | ORAL | Status: DC | PRN
  Filled 2018-01-10: qty 30

## 2018-01-10 MED ORDER — HYDROXYZINE HCL 50 MG PO TABS: 50.0000 mg | ORAL_TABLET | Freq: Four times a day (QID) | ORAL | Status: DC | PRN

## 2018-01-10 MED ORDER — ALUM & MAG HYDROXIDE-SIMETH 200-200-20 MG/5ML PO SUSP: 30.0000 mL | ORAL | Status: DC | PRN

## 2018-01-10 MED ORDER — TRAZODONE HCL 100 MG PO TABS
100.0000 mg | ORAL_TABLET | Freq: Every evening | ORAL | Status: DC | PRN
  Administered 2018-01-11 – 2018-01-27 (×9): 100 mg via ORAL
  Filled 2018-01-10 (×10): qty 1

## 2018-01-10 MED ORDER — ENSURE ENLIVE PO LIQD: 237.0000 mL | Freq: Two times a day (BID) | ORAL | 12 refills | Status: DC

## 2018-01-10 MED ORDER — ACETAMINOPHEN 325 MG PO TABS
650.0000 mg | ORAL_TABLET | Freq: Four times a day (QID) | ORAL | Status: DC | PRN
  Administered 2018-01-17 – 2018-01-22 (×3): 650 mg via ORAL
  Filled 2018-01-10 (×3): qty 2

## 2018-01-10 MED ORDER — OLANZAPINE 5 MG PO TBDP: 5.0000 mg | ORAL_TABLET | Freq: Three times a day (TID) | ORAL | Status: DC | PRN

## 2018-01-10 MED ORDER — HYDROXYZINE HCL 50 MG PO TABS: 50.0000 mg | ORAL_TABLET | Freq: Four times a day (QID) | ORAL | 0 refills | Status: DC | PRN

## 2018-01-10 MED ORDER — FLUOXETINE HCL 20 MG PO CAPS
20.0000 mg | ORAL_CAPSULE | Freq: Every day | ORAL | Status: DC
  Administered 2018-01-10 – 2018-01-29 (×19): 20 mg via ORAL
  Filled 2018-01-10 (×20): qty 1

## 2018-01-10 MED ORDER — ENSURE ENLIVE PO LIQD
237.0000 mL | Freq: Three times a day (TID) | ORAL | Status: DC
  Administered 2018-01-10 – 2018-01-28 (×41): 237 mL via ORAL

## 2018-01-10 MED ORDER — OLANZAPINE 5 MG PO TBDP
10.0000 mg | ORAL_TABLET | Freq: Every day | ORAL | Status: DC
  Administered 2018-01-10 – 2018-01-17 (×8): 10 mg via ORAL
  Filled 2018-01-10 (×8): qty 2

## 2018-01-10 MED ORDER — MAGNESIUM HYDROXIDE 400 MG/5ML PO SUSP: 30.0000 mL | Freq: Every day | ORAL | Status: DC | PRN

## 2018-01-10 MED ORDER — TRAZODONE HCL 100 MG PO TABS: 100.0000 mg | ORAL_TABLET | Freq: Every evening | ORAL | Status: DC | PRN

## 2018-01-10 MED ORDER — HYOSCYAMINE SULFATE 0.125 MG PO TBDP
0.1250 mg | ORAL_TABLET | Freq: Four times a day (QID) | ORAL | Status: DC | PRN
  Filled 2018-01-10: qty 1

## 2018-01-10 MED ORDER — ALUM & MAG HYDROXIDE-SIMETH 200-200-20 MG/5ML PO SUSP: 30.0000 mL | ORAL | 0 refills | Status: DC | PRN

## 2018-01-10 NOTE — Discharge Summary (Addendum)
Physician Discharge Summary Note  Patient:  Isabella [name] is an 42 y.o., female  MRN:  [id number]  DOB:  Mar 09, 1976  Patient phone:  [phone] (home)   Patient address:   [address] [place] [address],   Total Time spent with patient: Greater than 30 minutes  Date of Admission:  12/31/2017  Date of Discharge: 01-10-18  Reason for Admission: Worsening depression, neurovegetative symptoms, not eating/drinking, not caring for herself, and medication non-compliant for the past 2 weeks with possible trigger associated with death of pt's uncle 2 weeks ago.  Principal Problem: Schizophrenia [facility])  Discharge Diagnoses: Patient Active Problem List   Diagnosis Date Noted  . Schizophrenia ([place]) [F20.9] 12/31/2017  . Social anxiety disorder [F40.10] 08/02/2016  . 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 Schizophrenia  Past Medical History:  Past Medical History:  Diagnosis Date  . Bipolar disorder ([place])   . Hypertension   . Migraine 07/13/2016  . Schizophrenia ([place])    History reviewed. No pertinent surgical history.  Family History:  Family History  Problem Relation Age of Onset  . Mental illness Neg Hx    Family Psychiatric  History: See H&P  Social History:  Social History   Substance and Sexual Activity  Alcohol Use No     Social History   Substance and Sexual Activity  Drug Use No    Social History   Socioeconomic History  . Marital status: Single    Spouse name: Not on file  . Number of children: Not on file  . Years of education: Not on file  . Highest education level: Not on file  Occupational History  . Not on file  Social Needs  . Financial resource strain: Not on file  . Food insecurity:    Worry: Not on file    Inability: Not on file  . Transportation needs:    Medical: Not on file    Non-medical: Not on file  Tobacco Use  . Smoking status: Smoker, Current Status Unknown  . Smokeless  tobacco: Never Used  Substance and Sexual Activity  . Alcohol use: No  . Drug use: No  . Sexual activity: Never  Lifestyle  . Physical activity:    Days per week: Not on file    Minutes per session: Not on file  . Stress: Not on file  Relationships  . Social connections:    Talks on phone: Not on file    Gets together: Not on file    Attends religious service: Not on file    Active member of club or organization: Not on file    Attends meetings of clubs or organizations: Not on file    Relationship status: Not on file  Other Topics Concern  . Not on file  Social History Narrative  . Not on file   Hospital Course: (Per Md's discharge SRA): Isabella [name] is a 42 y/o F with history of schizophrenia who was admitted voluntarily from [facility] where she presented with depression, neurovegetative symptoms, not eating/drinking, not caring for herself, and medication non-compliant for the past 2 weeks with possible trigger associated with death of pt's uncle 2 weeks ago. Pt was medically cleared and then transferred to [facility] for additional treatment and stabilization.She was started on trial of abilify and lexapro, and doses have been titrated up during her stay.Pt reported incremental improvement of her depression symptoms, but she continues to have significant neurovegetative symptoms, remaining in bed for most of the  day without significant motivation from RN staff, and she continues to refuse to eat for the past 2 days.  Today upon evaluation, ptshares, "I'm feeling a little bit more better. I was sleeping a little." She has not been eating for the past 2 days, and when asked about this pt states, "I just don't feel like it - may be a little bit later." Despite RN staff encouraging her to take sips of Ensure or bites of her meal tray, pt has been refusing majority of nutrition intake. She endorses severe depression but denies SI/HI/AH/VH. She is tolerating her medications well, and she denies any  physical complaints. Discussed with patient about referral for ECT; her chart was reviewed by Dr. Weber Cooks at [facility], and pt was accepted. She remains in agreement for transfer to [facility] for trial of ECT, and discussed with patient that we will proceed with transfer today after discussing treatment plan with her family. Pt was in agreement with the above plan, and she had no further questions, comments, or concerns.  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 -Continue abilify 20mg  po qDay -Continue lexapro 10mg  po qDay  -Anxiety -Continue vistaril 50mg  po q6h prn anxiety  -agitation -Continue zydis 5mg  po q8h prn agitation -Continue geodon 20mg  IM q12h prn severe agitation  -insomnia  -Continue trazodone 100mg  po qhs prn insomnia   Activity:  as tolerated Diet:  normal Tests:  NA Other:  see above for DC plan  Physical Findings: AIMS: Facial and Oral Movements Muscles of Facial Expression: None, normal Lips and Perioral Area: None, normal Jaw: None, normal Tongue: None, normal,Extremity Movements Upper (arms, wrists, hands, fingers): None, normal Lower (legs, knees, ankles, toes): None, normal, Trunk Movements Neck, shoulders, hips: None, normal, Overall Severity Severity of abnormal movements (highest score from questions above): None, normal Incapacitation due to abnormal movements: None, normal Patient's awareness of abnormal movements (rate only patient's report): No Awareness, Dental Status Current problems with teeth and/or dentures?: No Does patient usually wear dentures?: No  CIWA:    COWS:     Musculoskeletal: Strength & Muscle Tone: within normal limits Gait & Station: normal Patient leans: N/A  Psychiatric Specialty  Exam: Physical Exam  Nursing note and vitals reviewed. Constitutional: She appears well-developed.  HENT:  Head: Normocephalic.  Eyes: Pupils are equal, round, and reactive to light.  Cardiovascular: Normal rate.  Respiratory: Effort normal.  GI: Soft.  Genitourinary:  Genitourinary Comments: Deffered  Musculoskeletal: Normal range of motion.  Neurological: She is alert.  Skin: Skin is warm.    Review of Systems  Constitutional: Negative.   HENT: Negative.   Eyes: Negative.   Respiratory: Negative.   Cardiovascular: Negative.   Gastrointestinal: Negative.   Genitourinary: Negative.   Musculoskeletal: Negative.   Skin: Negative.   Neurological: Negative.   Endo/Heme/Allergies: Negative.   Psychiatric/Behavioral: Positive for depression (Stable) and hallucinations (Hx. Schizophrenia, catatonic). Negative for memory loss, substance abuse and suicidal ideas. The patient has insomnia (Stable). The patient is not nervous/anxious.     Blood pressure 130/78, pulse 98, temperature 98 F (36.7 C), temperature source Oral, resp. rate 16, height 5\' 3"  (1.6 m), weight 83.9 kg, SpO2 96 %.Body mass index is 32.77 kg/m.  See Md's SRA  Have you used any form of tobacco in the last 30 days? (Cigarettes, Smokeless Tobacco, Cigars, and/or Pipes): No  Has this patient  used any form of tobacco in the last 30 days? (Cigarettes, Smokeless Tobacco, Cigars, and/or Pipes):  No  Blood Alcohol level:  Lab Results  Component Value Date   ETH <10 12/29/2017   ETH <5 65/46/5035   Metabolic Disorder Labs:  Lab Results  Component Value Date   HGBA1C 5.4 01/31/2016   MPG 108 01/31/2016   Lab Results  Component Value Date   PROLACTIN 124.2 (H) 01/31/2016   Lab Results  Component Value Date   CHOL 152 01/31/2016   TRIG 147 01/31/2016   HDL 39 (L) 01/31/2016   CHOLHDL 3.9 01/31/2016   VLDL 29 01/31/2016   LDLCALC 84 01/31/2016   See Psychiatric Specialty Exam and Suicide Risk Assessment  completed by Attending Physician prior to discharge.  Discharge destination:  Other:  [facility] for ECT  Is patient on multiple antipsychotic therapies at discharge:  No   Has Patient had three or more failed trials of antipsychotic monotherapy by history:  No  Recommended Plan for Multiple Antipsychotic Therapies: NA  Allergies as of 01/10/2018   No Known Allergies     Medication List    STOP taking these medications   benztropine 0.5 MG tablet Commonly known as:  COGENTIN   clonazePAM 0.5 MG tablet Commonly known as:  KLONOPIN   doxepin 25 MG capsule Commonly known as:  SINEQUAN   hyoscyamine 0.125 MG SL tablet Commonly known as:  LEVSIN SL Replaced by:  hyoscyamine 0.125 MG Tbdp disintergrating tablet   losartan 25 MG tablet Commonly known as:  COZAAR   megestrol 400 MG/10ML suspension Commonly known as:  MEGACE   OLANZapine 2.5 MG tablet Commonly known as:  ZYPREXA Replaced by:  OLANZapine zydis 5 MG disintegrating tablet   propranolol 10 MG tablet Commonly known as:  INDERAL   venlafaxine XR 75 MG 24 hr capsule Commonly known as:  EFFEXOR-XR     TAKE these medications     Indication  acetaminophen 325 MG tablet Commonly known as:  TYLENOL Take 2 tablets (650 mg total) by mouth every 6 (six) hours as needed for mild pain.  Indication:  Fever, Pain   alum & mag hydroxide-simeth 200-200-20 MG/5ML suspension Commonly known as:  MAALOX/MYLANTA Take 30 mLs by mouth every 4 (four) hours as needed for indigestion.  Indication:  Acid Indigestion   ARIPiprazole 20 MG tablet Commonly known as:  ABILIFY Take 1 tablet (20 mg total) by mouth daily. For mood control Start taking on:  01/11/2018  Indication:  Mood control   escitalopram 5 MG tablet Commonly known as:  LEXAPRO Take 3 tablets (15 mg total) by mouth daily. For depression Start taking on:  01/11/2018  Indication:  Major Depressive Disorder   feeding supplement (ENSURE ENLIVE) Liqd Take 237 mLs by  mouth 2 (two) times daily between meals. For nutritional supplementation  Indication:  Nutritional supplement   hydrOXYzine 50 MG tablet Commonly known as:  ATARAX/VISTARIL Take 1 tablet (50 mg total) by mouth every 6 (six) hours as needed for anxiety. What changed:    medication strength  how much to take  Indication:  Feeling Anxious   hyoscyamine 0.125 MG Tbdp disintergrating tablet Commonly known as:  ANASPAZ Take 1 tablet (0.125 mg total) by mouth every 4 (four) hours as needed for cramping. Replaces:  hyoscyamine 0.125 MG SL tablet  Indication:  Cramping   magnesium hydroxide 400 MG/5ML suspension Commonly known as:  MILK OF MAGNESIA Take 30 mLs by mouth daily as needed for mild constipation.  Indication:  Constipation   OLANZapine zydis 5 MG disintegrating tablet Commonly known as:  ZYPREXA Take 1 tablet (5 mg total) by mouth every 8 (eight) hours as needed (agitation/psychosis). Replaces:  OLANZapine 2.5 MG tablet  Indication:  Agitation, psychosis   traZODone 100 MG tablet Commonly known as:  DESYREL Take 1 tablet (100 mg total) by mouth at bedtime as needed for sleep. What changed:    medication strength  how much to take  Indication:  Anxiety Disorder      Follow-up Information    [facility] INPATIENT BEHAVIORAL MEDICINE Follow up.   Specialty:  Behavioral Health Why:  Betsy Pries will transport you today at 11:30 to take you to [facility] information: [place] [id number] [address] [place] [phone]         Follow-up recommendations: Activity:  As tolerated Diet: As recommended by your primary care doctor. Keep all scheduled follow-up appointments as recommended.   Comments: Transferred to the [facility] for ECT.   Signed: Lindell Spar, NP, PMHNP, FNP-BC 01/10/2018, 2:19 PM   Patient seen, Suicide Assessment Completed.  Disposition Plan Reviewed

## 2018-01-10 NOTE — Progress Notes (Signed)
Patient ID: Isabella [name], female   DOB: [birth year], 42 y.o.   MRN: [id number] I spoke with the patient's mother this evening on the telephone with the help of a Spanish language interpreter.  Mother is Isabella [name], telephone [phone]

## 2018-01-10 NOTE — Progress Notes (Signed)
Recreation Therapy Notes  Date: 8.28.19 Time: 0930 Location: [address] Dayroom  Group Topic: Stress Management  Goal Area(s) Addresses:  Patient will verbalize importance of using healthy stress management.  Patient will identify positive emotions associated with healthy stress management.   Intervention: Architect paper, markers  Activity :  Patients were given a sheet of construction paper and a marker.  Patients were to trace their hands on the paper.  On the right hand, patients were to write down the things that cause them stress.  On the the left hand, they were to write all positive coping skills they use to help them deal with their stresses.  LRT then lead the group in a meditation to relax.  Education: Stress Management, Discharge Planning.   Education Outcome: Acknowledges edcuation/In group clarification offered/Needs additional education  Clinical Observations/Feedback: Pt did not attend group.     Victorino Sparrow, LRT/CTRS         Victorino Sparrow A 01/10/2018 11:33 AM

## 2018-01-10 NOTE — H&P (Signed)
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 Depression [F32.9] 01/10/2018  . Dehydration [E86.0] 01/10/2018  . Anorexia [R63.0] 01/10/2018  . Schizophrenia ([place]) [F20.9] 12/31/2017  . Social anxiety disorder [F40.10] 08/02/2016  . 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  Chart reviewed.  Patient was interviewed with the assistance of a Hospital provided [place] language interpreter.  Patient's mother was also interviewed with the patient's consent and a official Hospital provided [place]  language interpreter was also involved in that conversation.  Patient was transferred from [facility] because of diagnosis of schizophrenia and intention to try electroconvulsive therapy.  Patient was difficult to interview.  She is extremely withdrawn and speaks very little and it is not clear that anything she says is very reliable.  She does know that she is in "some kind of rehab".  She knows the correct year.  She is aware that she was transferred here for some kind of therapy but did not understand exactly what it was.  Patient says she has been feeling "not so good" for "sometime".  She was not able to clearly articulate what she meant by feeling not so good although it sounds like it has been going on for at least several months.  Mother reports that the patient has been once again displaying symptoms of profound withdrawal at home.  She will sit in the same chair not talking not acting not doing anything for an entire day.  She is eating and drinking very little.  Converses very little.  Patient admits that she has been noncompliant with medication probably for at least the last 6 months.   No evidence or report of alcohol or drug abuse.  Patient denies any suicidal ideation.  She denies having any hallucinations. Associated Signs/Symptoms: Depression Symptoms:  depressed mood, anhedonia, psychomotor retardation, difficulty concentrating, hopelessness, disturbed sleep, decreased appetite, (Hypo) Manic Symptoms:  None reported currently Anxiety Symptoms:  Patient could not provide a clear answer to this Psychotic Symptoms:  She denies any psychotic symptoms although this is an area where I am not sure how reliable her answers are. PTSD Symptoms: Negative Total Time spent with patient: 1 hour  Past Psychiatric History: Patient has had several hospitalizations just in the last couple years.  Prior to that it seems like she had hospitalizations in [place] as well and also may have had psychiatric treatment in the legal system.  The diagnosis of schizophrenia has been carried a long as the primary diagnosis throughout her treatment in the [facility] system although I am not sure where that came from.  Her symptoms seem to almost always be primarily extreme withdrawal, disappearance of appetite, disappearance of any activity or interaction.  Often thought of as being near catatonic.  No history of suicide attempts.  Patient has appeared to be disorganized and unreliable in her thinking in the past but it is not clear that she is ever reported frank delusions or hallucinations.  I think it is still an open question if this is schizophrenia or psychotic depression or bipolar disorder.  No known history of suicide attempts or violence.  Patient seems to have shown at least partial response over a period of time when on antipsychotics and antidepressants in the past.  Is the  patient at risk to self? Yes.    Has the patient been a risk to self in the past 6 months? Yes.    Has the patient been a risk to self within the distant past? Yes.    Is the patient a risk to others? No.  Has the  patient been a risk to others in the past 6 months? No.  Has the patient been a risk to others within the distant past? No.   Prior Inpatient Therapy:   Prior Outpatient Therapy:    Alcohol Screening:   Substance Abuse History in the last 12 months:  No. Consequences of Substance Abuse: Negative Previous Psychotropic Medications: Yes  Psychological Evaluations: Yes  Past Medical History:  Past Medical History:  Diagnosis Date  . Bipolar disorder ([place])   . Hypertension   . Migraine 07/13/2016  . Schizophrenia ([place])    No past surgical history on file. Family History:  Family History  Problem Relation Age of Onset  . Mental illness Neg Hx    Family Psychiatric  History: None is reported Tobacco Screening:   Social History:  Social History   Substance and Sexual Activity  Alcohol Use No     Social History   Substance and Sexual Activity  Drug Use No    Additional Social History:                           Allergies:  No Known Allergies Lab Results: No results found for this or any previous visit (from the past 48 hour(s)).  Blood Alcohol level:  Lab Results  Component Value Date   ETH <10 12/29/2017   ETH <5 40/02/2724    Metabolic Disorder Labs:  Lab Results  Component Value Date   HGBA1C 5.4 01/31/2016   MPG 108 01/31/2016   Lab Results  Component Value Date   PROLACTIN 124.2 (H) 01/31/2016   Lab Results  Component Value Date   CHOL 152 01/31/2016   TRIG 147 01/31/2016   HDL 39 (L) 01/31/2016   CHOLHDL 3.9 01/31/2016   VLDL 29 01/31/2016   LDLCALC 84 01/31/2016    Current Medications: Current Facility-Administered Medications  Medication Dose Route Frequency Provider Last Rate Last Dose  . acetaminophen (TYLENOL) tablet 650 mg  650 mg Oral Q6H PRN Debroh Sieloff T, MD      . alum & mag hydroxide-simeth (MAALOX/MYLANTA) 200-200-20 MG/5ML suspension 30 mL  30 mL Oral Q4H PRN Kailena Lubas T, MD      . feeding supplement (ENSURE ENLIVE)  (ENSURE ENLIVE) liquid 237 mL  237 mL Oral TID BM Jarrick Fjeld T, MD      . FLUoxetine (PROZAC) capsule 20 mg  20 mg Oral Daily Sarvesh Meddaugh T, MD      . hydrOXYzine (ATARAX/VISTARIL) tablet 50 mg  50 mg Oral Q6H PRN Marica Trentham T, MD      . hyoscyamine (ANASPAZ) disintergrating tablet 0.125 mg  0.125 mg Oral Q6H PRN Letzy Gullickson T, MD      . magnesium hydroxide (MILK OF MAGNESIA) suspension 30 mL  30 mL Oral Daily PRN Rusti Arizmendi T, MD      . OLANZapine zydis (ZYPREXA) disintegrating tablet 10 mg  10 mg Oral QHS Marin Milley T, MD      . traZODone (DESYREL) tablet 100 mg  100 mg Oral QHS PRN Aleya Durnell, Madie Reno, MD       PTA Medications: Medications Prior to Admission  Medication Sig Dispense Refill Last Dose  . acetaminophen (TYLENOL) 325 MG tablet Take 2 tablets (650 mg total) by mouth every 6 (six) hours as needed for mild pain.     Marland Kitchen alum & mag hydroxide-simeth (MAALOX/MYLANTA) 200-200-20 MG/5ML suspension Take 30 mLs by mouth every 4 (four) hours as needed for indigestion. 355 mL 0   . [START ON 01/11/2018] ARIPiprazole (ABILIFY) 20 MG tablet Take 1 tablet (20 mg total) by mouth daily. For mood control     . [START ON 01/11/2018] escitalopram (LEXAPRO) 5 MG tablet Take 3 tablets (15 mg total) by mouth daily. For depression     . feeding supplement, ENSURE ENLIVE, (ENSURE ENLIVE) LIQD Take 237 mLs by mouth 2 (two) times daily between meals. For nutritional supplementation 237 mL 12   . hydrOXYzine (ATARAX/VISTARIL) 50 MG tablet Take 1 tablet (50 mg total) by mouth every 6 (six) hours as needed for anxiety. 1 tablet 0   . hyoscyamine (ANASPAZ) 0.125 MG TBDP disintergrating tablet Take 1 tablet (0.125 mg total) by mouth every 4 (four) hours as needed for cramping.  0   . magnesium hydroxide (MILK OF MAGNESIA) 400 MG/5ML suspension Take 30 mLs by mouth daily as needed for mild constipation. 360 mL 0   . OLANZapine zydis (ZYPREXA) 5 MG disintegrating tablet Take 1 tablet (5 mg total) by mouth  every 8 (eight) hours as needed (agitation/psychosis).     . traZODone (DESYREL) 100 MG tablet Take 1 tablet (100 mg total) by mouth at bedtime as needed for sleep.       Musculoskeletal: Strength & Muscle Tone: within normal limits Gait & Station: normal Patient leans: N/A  Psychiatric Specialty Exam: Physical Exam  Nursing note and vitals reviewed. Constitutional: She appears well-developed and well-nourished.  HENT:  Head: Normocephalic and atraumatic.  Eyes: Pupils are equal, round, and reactive to light. Conjunctivae are normal.  Neck: Normal range of motion.  Cardiovascular: Regular rhythm and normal heart sounds.  Respiratory: Effort normal. No respiratory distress.  GI: Soft.  Musculoskeletal: Normal range of motion.  Neurological: She is alert.  Skin: Skin is warm and dry.  Psychiatric: Her affect is blunt. Her speech is delayed. She is slowed and withdrawn. Cognition and memory are impaired. She expresses inappropriate judgment. She exhibits a depressed mood. She expresses no suicidal ideation.    Review of Systems  Constitutional: Negative.   HENT: Negative.   Eyes: Negative.   Respiratory: Negative.   Cardiovascular: Negative.   Gastrointestinal: Negative.   Musculoskeletal: Negative.   Skin: Negative.   Neurological: Negative.   Psychiatric/Behavioral: Positive for depression and memory loss. Negative for hallucinations, substance abuse and suicidal ideas. The patient is nervous/anxious and has insomnia.     There were no vitals taken for this visit.There is no height or weight on file to calculate BMI.  General Appearance: Casual  Eye Contact:  Minimal  Speech:  Slow  Volume:  Decreased  Mood:  Dysphoric  Affect:  Blunt, Depressed and Flat  Thought Process:  Disorganized  Orientation:  Full (Time, Place, and Person)  Thought Content:  Rumination  Suicidal Thoughts:  No  Homicidal Thoughts:  No  Memory:  Immediate;   Fair Recent;   Fair Remote;   Fair   Judgement:  Impaired  Insight:  Shallow  Psychomotor Activity:  Decreased  Concentration:  Concentration: Poor  Recall:  Poor  Fund of Knowledge:  Fair  Language:  Fair  Akathisia:  No  Handed:  Right  AIMS (if indicated):     Assets:  Financial Resources/Insurance Housing Physical Health Resilience Social Support  ADL's:  Impaired  Cognition:  Impaired,  Mild  Sleep:       Treatment Plan Summary: Daily contact with patient to assess and evaluate symptoms and progress in treatment, Medication management and Plan Chart reviewed.  Labs reviewed.  The patient's mother is willing to give consent for ECT as the patient currently tells me that she is not able to make up her mind and requests me to ask her mother.  The plan currently is to meet her mother tomorrow evening at visiting hours to sign paperwork.  Meantime I am restarting Zyprexa which has been reported to be helpful in the past as well as Prozac for what seems like mostly psychotic depression type symptoms.  I am going to defer starting any benzodiazepines at this point.  I am not sure whether the patient is truly catatonic or if this is just extreme withdrawal from depression.  Nursing aware of the challenges with this patient and will be involved in trying to make sure she is getting proper nutrition and hydration.  I have reordered a set of basic labs and an EKG.  We anticipate starting ECT Friday if possible.  Observation Level/Precautions:  15 minute checks  Laboratory:  CBC Chemistry Profile  Psychotherapy:    Medications:    Consultations:    Discharge Concerns:    Estimated LOS:  Other:     Physician Treatment Plan for Primary Diagnosis: Schizophrenia, catatonic type ([place]) Long Term Goal(s): Improvement in symptoms so as ready for discharge  Short Term Goals: Ability to verbalize feelings will improve and Ability to demonstrate self-control will improve  Physician Treatment Plan for Secondary Diagnosis: Principal  Problem:   Schizophrenia, catatonic type ([place]) Active Problems:   Schizophrenia ([place])   Depression   Dehydration   Anorexia  Long Term Goal(s): Improvement in symptoms so as ready for discharge  Short Term Goals: Ability to identify and develop effective coping behaviors will improve and Ability to maintain clinical measurements within normal limits will improve  I certify that inpatient services furnished can reasonably be expected to improve the patient's condition.    [name], MD 8/28/20194:09 PM

## 2018-01-10 NOTE — Plan of Care (Signed)
With interpreter [place] Patient understood  information  given  Problem: Education: Goal: Knowledge of [place] [facility] Education information/materials will improve Outcome: Progressing

## 2018-01-10 NOTE — Plan of Care (Signed)
Pt attended one recreation therapy group session and did not participate.   Victorino Sparrow, LRT/CTRS

## 2018-01-10 NOTE — BH Assessment (Addendum)
D: Pt A & O to self and place. Denies SI, HI, AVH and pain at this time "no". Transferred to [facility]) as ordered for ECT treatment with Dr. Weber Cooks. Pt showered, brushed her teeth and changed scrubs prior to transfer.   A: Report called to receiving nurse, Meredith Mody, RN on BMU. Vitals rechecked prior to transfer and was WNL. D/C instructions reviewed with pt in reference to follow up care and code for her family to be able to have access to her. All belongings from room given to pt at time of departure; pt had no belongings in locker. Scheduled medications given with verbal education and effects monitored. Safety checks maintained without incident till time of d/c.  R: Pt receptive to care. Compliant with medications when offered. Denies adverse drug reactions when assessed. Verbalized understanding related to d/c instructions. Ambulatory with a slow but steady gait. Picked up in lobby by [employer].  Appears to be in no physical distress at time of departure.

## 2018-01-10 NOTE — Progress Notes (Addendum)
Admission Note: Report [place] RN   D: Pt appeared depressed  With  a flat affect.  Pt  denies SI / AVH at this time.  42 year old female in under the services of Dr. Weber Cooks . Patient present  With Schizophrenia . Patient came to [place] . Medication  Have not proven to work .  Affect flat and depressed not sleeping  Or eating. Staring  Out  Into space .  Non compliant  With medication    Pt is redirectable and cooperative with assessment.      A: Pt admitted to unit per protocol, skin assessment and search done and no contraband found  Corporate treasurer.  Pt  educated on therapeutic milieu rules. Pt was introduced to milieu by nursing staff.    R: Pt was receptive to education about the milieu .  15 min safety checks started. Probation officer offered support

## 2018-01-10 NOTE — Tx Team (Signed)
Initial Treatment Plan 01/10/2018 6:09 PM [name] OOJ:[id number]    PATIENT STRESSORS: Health problems Medication change or noncompliance   PATIENT STRENGTHS: Ability for insight Capable of independent living Physical Health Supportive family/friends   PATIENT IDENTIFIED PROBLEMS: Depression 01/10/18  ECT 01/10/18                   DISCHARGE CRITERIA:  Ability to meet basic life and health needs Improved stabilization in mood, thinking, and/or behavior  PRELIMINARY DISCHARGE PLAN: Outpatient therapy Return to previous living arrangement  PATIENT/FAMILY INVOLVEMENT: This treatment plan has been presented to and reviewed with the patient, Isabella [name], and/or family member,  .  The patient and family have been given the opportunity to ask questions and make suggestions.  Leodis Liverpool, RN 01/10/2018, 6:09 PM

## 2018-01-10 NOTE — Progress Notes (Signed)
Recreation Therapy Notes  INPATIENT RECREATION TR PLAN  [name] MRN: [id number] DOB: 02-09-1976 Today's Date: 01/10/2018  Rec Therapy Plan Is patient appropriate for Therapeutic Recreation?: Yes Treatment times per week: about 3 days  Estimated Length of Stay: 5-7 days TR Treatment/Interventions: Group participation (Comment)  Discharge Criteria Pt will be discharged from therapy if:: Discharged Treatment plan/goals/alternatives discussed and agreed upon by:: Patient/family  Discharge Summary Short term goals set: See patient care plan Short term goals met: Not met Progress toward goals comments: Groups attended Which groups?: Wellness Reason goals not met: Pt did not participate or did not attend groups. Therapeutic equipment acquired: N/A Reason patient discharged from therapy: Discharge from hospital Pt/family agrees with progress & goals achieved: Yes Date patient discharged from therapy: 01/10/18   Victorino Sparrow, LRT/CTRS   Ria Comment, Edwing Figley A 01/10/2018, 12:16 PM

## 2018-01-10 NOTE — Progress Notes (Signed)
  [facility] Adult Case Management Discharge Plan :  Will you be returning to the same living situation after discharge:  No. At discharge, do you have transportation home?: Yes,  [place] Do you have the ability to pay for your medications: Yes,  MCD  Release of information consent forms completed and in the chart;  Patient's signature needed at discharge.  Patient to Follow up at: Follow-up Information    [facility] Follow up.   Specialty:  Behavioral Health Why:  Betsy Pries will transport you today at 11:30 to take you to [facility] information: [place] [phone] [address] [place] [place] [phone]          Next level of care provider has access to [place] and Suicide Prevention discussed: Yes,  yes  Have you used any form of tobacco in the last 30 days? (Cigarettes, Smokeless Tobacco, Cigars, and/or Pipes): No  Has patient been referred to the Quitline?: N/A patient is not a smoker  Patient has been referred for addiction treatment: [place], LCSW 01/10/2018, 10:59 AM

## 2018-01-10 NOTE — BHH Suicide Risk Assessment (Signed)
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 Depression [F32.9] 01/10/2018  . Dehydration [E86.0] 01/10/2018  . Anorexia [R63.0] 01/10/2018  . Schizophrenia ([place]) [F20.9] 12/31/2017  . Social anxiety disorder [F40.10] 08/02/2016  . 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  Chart reviewed.  Also spoke with the patient's mother by telephone again using a [place] language interpreter.  42 year old woman with a history of recurrent hospitalizations with symptoms of extreme withdrawal and possible catatonia.  Patient reports that she "does not feel good" but cannot describe exactly what that means.  She denies any thought or intention of killing herself.  She has not been acting out aggressively towards herself or anyone else but she has not been eating or drinking well.  Continued Clinical Symptoms:    The "Alcohol Use Disorders Identification Test", Guidelines for Use in Primary Care, Second Edition.  World Pharmacologist [facility]). Score between 0-7:  no or low risk or alcohol related problems. Score between 8-15:  moderate risk of alcohol related problems. Score between 16-19:  high risk of alcohol related problems. Score 20 or above:  warrants further diagnostic evaluation for alcohol dependence and treatment.   CLINICAL FACTORS:   Depression:   Anhedonia Hopelessness Severe Schizophrenia:   Depressive state   Musculoskeletal: Strength & Muscle Tone: within normal limits Gait & Station: unsteady Patient leans: N/A  Psychiatric Specialty Exam: Physical  Exam  ROS  There were no vitals taken for this visit.There is no height or weight on file to calculate BMI.  General Appearance: Casual  Eye Contact:  Minimal  Speech:  Slow  Volume:  Decreased  Mood:  Depressed  Affect:  Congruent and Flat  Thought Process:  Disorganized  Orientation:  Full (Time, Place, and Person)  Thought Content:  Rumination  Suicidal Thoughts:  No  Homicidal Thoughts:  No  Memory:  Immediate;   Fair Recent;   Fair Remote;   Fair  Judgement:  Impaired  Insight:  Shallow  Psychomotor Activity:  Decreased  Concentration:  Concentration: Poor  Recall:  [employer] of Knowledge:  Fair  Language:  Fair  Akathisia:  No  Handed:  Right  AIMS (if indicated):     Assets:  Desire for Improvement Housing Physical Health Social Support  ADL's:  Impaired  Cognition:  Impaired,  Mild  Sleep:         COGNITIVE FEATURES THAT CONTRIBUTE TO RISK:  Loss of executive function    SUICIDE RISK:   Mild:  Suicidal ideation of limited frequency, intensity, duration, and specificity.  There are no identifiable plans, no associated intent, mild dysphoria and related symptoms, good self-control (both objective and subjective assessment), few other risk factors, and identifiable protective factors, including available and accessible social support.  PLAN OF CARE: Patient has been admitted to our hospital with the intention of beginning ECT.  Patient is on 15-minute checks which for now should be adequate for safety.  Treatment team will be actively involved in ongoing assessment of her needs and assessment of suicidality prior to any discharge.  Medication and ECT ER anticipated to be primary therapies during this hospitalization  I certify that inpatient services furnished can reasonably be expected to improve the patient's condition.   [name], MD 01/10/2018, 4:05 PM

## 2018-01-10 NOTE — BHH Suicide Risk Assessment (Signed)
[facility] Discharge Suicide Risk Assessment   Principal Problem: Schizophrenia [facility]) Discharge Diagnoses:  Patient Active Problem List   Diagnosis Date Noted  . Schizophrenia ([place]) [F20.9] 12/31/2017  . Social anxiety disorder [F40.10] 08/02/2016  . 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  Respiratory: Negative for cough and shortness of breath.   Cardiovascular: Negative for chest pain.  Gastrointestinal: Negative for abdominal pain, heartburn, nausea and vomiting.  Psychiatric/Behavioral: Positive for depression. Negative for hallucinations and suicidal ideas. The patient is not nervous/anxious and does not have insomnia.     Blood pressure (!) 129/91, pulse (!) 102, temperature 98 F (36.7 C), temperature source Oral, resp. rate 16, height 5\' 3"  (1.6 m), weight 83.9 kg, SpO2 96 %.Body mass index is 32.77 kg/m.  General Appearance: Casual and Fairly Groomed  Engineer, water::  Fair  Speech:  Clear and Coherent and Normal Rate  Volume:  Decreased  Mood:  Depressed  Affect:  Congruent, Depressed and Flat  Thought Process:  Coherent and Goal Directed  Orientation:  Full (Time, Place, and Person)  Thought Content:  Logical  Suicidal Thoughts:  No  Homicidal Thoughts:  No  Memory:  Immediate;   Fair Recent;   Fair Remote;   Fair  Judgement:  Poor  Insight:  Lacking  Psychomotor Activity:  Normal  Concentration:  Good  Recall:  Good  Fund of Knowledge:Fair  Language: Fair  Akathisia:  No  Handed:    AIMS (if indicated):     Assets:  Resilience Social Support  Sleep:  Number of Hours: 6.75  Cognition: WNL  ADL's:  Intact   Mental Status Per Nursing Assessment::   On Admission:  Suicidal ideation indicated by  patient  Demographic Factors:  Low socioeconomic status and Unemployed  Loss Factors: NA  Historical Factors: Family history of mental illness or substance abuse  Risk Reduction Factors:   Sense of responsibility to family and Positive social support  Continued Clinical Symptoms:  Severe Anxiety and/or Agitation Depression:   Severe Schizophrenia:   Paranoid or undifferentiated type  Cognitive Features That Contribute To Risk:  None    Suicide Risk:  Mild:  Suicidal ideation of limited frequency, intensity, duration, and specificity.  There are no identifiable plans, no associated intent, mild dysphoria and related symptoms, good self-control (both objective and subjective assessment), few other risk factors, and identifiable protective factors, including available and accessible social support.  Subjective Data:  Isabella [name] is a 42 y/o F with history of schizophrenia who was admitted voluntarily from [facility] where she presented with depression, neurovegetative symptoms, not eating/drinking, not caring for herself, and medication non-compliant for the past 2 weeks with possible trigger associated with death of pt's uncle 2 weeks ago. Pt was medically cleared and then transferred to [facility] for additional treatment and stabilization.She was started on trial of abilify and lexapro, and doses have been titrated up during her stay.Pt reported incremental improvement of her depression symptoms, but she continues to have significant neurovegetative symptoms, remaining in bed for most of the day without significant motivation from RN staff, and she continues to refuse to eat for the past 2 days.  Today upon evaluation, pt shares, "I'm feeling a little bit more better. I was sleeping a little." She has not been  eating for the past 2 days, and when asked about this pt states, "I just don't feel like it - may be a little bit later." Despite RN staff encouraging her to take sips of Ensure or bites  of her meal tray, pt has been refusing majority of nutrition intake. She endorses severe depression but denies SI/HI/AH/VH. She is tolerating her medications well, and she denies any physical complaints. Discussed with patient about referral for ECT; her chart was reviewed by Dr. Weber Cooks at [facility], and pt was accepted. She remains in agreement for transfer to [facility] for trial of ECT, and discussed with patient that we will proceed with transfer today after discussing treatment plan with her family. Pt was in agreement with the above plan, and she had no further questions, comments, or concerns.  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

## 2018-01-10 NOTE — BHH Group Notes (Signed)
Patient did not attend orientation and goals group.

## 2018-01-11 DIAGNOSIS — Z01810 Encounter for preprocedural cardiovascular examination: Secondary | ICD-10-CM

## 2018-01-11 LAB — CBC WITH DIFFERENTIAL/PLATELET
BASOS PCT: 0 %
Basophils Absolute: 0 10*3/uL (ref 0–0.1)
EOS ABS: 0.5 10*3/uL (ref 0–0.7)
Eosinophils Relative: 6 %
HCT: 39.1 % (ref 35.0–47.0)
HEMOGLOBIN: 13.6 g/dL (ref 12.0–16.0)
Lymphocytes Relative: 30 %
Lymphs Abs: 2.8 10*3/uL (ref 1.0–3.6)
MCH: 31.6 pg (ref 26.0–34.0)
MCHC: 34.7 g/dL (ref 32.0–36.0)
MCV: 91.2 fL (ref 80.0–100.0)
MONOS PCT: 8 %
Monocytes Absolute: 0.7 10*3/uL (ref 0.2–0.9)
NEUTROS PCT: 56 %
Neutro Abs: 5.1 10*3/uL (ref 1.4–6.5)
Platelets: 288 10*3/uL (ref 150–440)
RBC: 4.29 MIL/uL (ref 3.80–5.20)
RDW: 13.9 % (ref 11.5–14.5)
WBC: 9.1 10*3/uL (ref 3.6–11.0)

## 2018-01-11 LAB — PREGNANCY, URINE: Preg Test, Ur: NEGATIVE

## 2018-01-11 LAB — BASIC METABOLIC PANEL
Anion gap: 9 (ref 5–15)
BUN: 19 mg/dL (ref 6–20)
CO2: 25 mmol/L (ref 22–32)
CREATININE: 0.36 mg/dL — AB (ref 0.44–1.00)
Calcium: 8.8 mg/dL — ABNORMAL LOW (ref 8.9–10.3)
Chloride: 108 mmol/L (ref 98–111)
GFR calc non Af Amer: >60 mL/min (ref >60)
Glucose, Bld: 87 mg/dL (ref 70–99)
Potassium: 3.2 mmol/L — ABNORMAL LOW (ref 3.5–5.1)
SODIUM: 142 mmol/L (ref 135–145)

## 2018-01-11 NOTE — Plan of Care (Addendum)
Patient found asleep in bed upon my arrival. Patient remains isolative to her room throughout the evening. Patient found to have 3 Ensure untouched next to her bed upon my arrival. Patient encouraged to drink one and she did. Patient refused snack. Denies SI/HI/AVH. Mood and affect are depressed. Speech is soft and slow. Patient's english is good. Instructed to provide urine sample for HCG. Pregnancy test negative. EKG completed and placed in chart. Educated regarding NPO status. Verbalized understanding. Q 15 minute checks maintained. Will continue to monitor throughout the shift. Patient slept 7.5 hours. No apparent distress. NPO status maintained. CBG 78. Will endorse care to oncoming shift.  Problem: Education: Goal: Verbalization of understanding the information provided will improve Outcome: Progressing   Problem: Safety: Goal: Ability to remain free from injury will improve Outcome: Progressing   Problem: Education: Goal: Knowledge of [place] [facility] Education information/materials will improve Outcome: Not Progressing Goal: Emotional status will improve Outcome: Not Progressing Goal: Mental status will improve Outcome: Not Progressing   Problem: Coping: Goal: Coping ability will improve Outcome: Not Progressing Goal: Will verbalize feelings Outcome: Not Progressing

## 2018-01-11 NOTE — BHH Suicide Risk Assessment (Signed)
[facility] INPATIENT:  Family/Significant Other Suicide Prevention Education  Suicide Prevention Education:  Education Completed; Patients Sister-in-law, Mrs. Josephina Shih, [phone] has been identified by the patient as the family member/significant other with whom the patient will be residing, and identified as the person(s) who will aid the patient in the event of a mental health crisis (suicidal ideations/suicide attempt).  With written consent from the patient, the family member/significant other has been provided the following suicide prevention education, prior to the and/or following the discharge of the patient.  The suicide prevention education provided includes the following:  Suicide risk factors  Suicide prevention and interventions  National Suicide Hotline telephone number  [facility] assessment telephone number  [facility] Emergency Assistance [place] and/or Residential Mobile Crisis Unit telephone number  Request made of family/significant other to:  Remove weapons (e.g., guns, rifles, knives), all items previously/currently identified as safety concern.    Remove drugs/medications (over-the-counter, prescriptions, illicit drugs), all items previously/currently identified as a safety concern.  The family member/significant other verbalizes understanding of the suicide prevention education information provided.  The family member/significant other agrees to remove the items of safety concern listed above.  CSW spoke with Leeroy Bock the patients Sister-in-law to check-in, answer any questions and address concerns. She reports that the patient doesn't have any access to any guns/weapons in the home and can return there at discharge.    Darin Engels 01/11/2018, 9:21 AM

## 2018-01-11 NOTE — Progress Notes (Signed)
Recreation Therapy Notes  Date: 01/11/2018  Time: 9:30 am   Location: Craft Room   Behavioral response: N/A   Intervention Topic: Goals  Discussion/Intervention: Patient did not attend group.   Clinical Observations/Feedback:  Patient did not attend group.   Ariannah Arenson LRT/CTRS        Tierrah Anastos 01/11/2018 11:04 AM

## 2018-01-11 NOTE — Plan of Care (Signed)
Patient did not complete a self inventory today. Patient is [place] speaking and is able to completely understand and communicate in [place]. Patient reports depression, sadness and hopelessness. Patient denies SI/HI/AVH. Patient isolates to room and has minimal contact with staff. Patient is offered oral medications but refuses stating "I have taken medicine before, it did not work." Patient is approrpaite and calm during assessment. Patient has no complaints. Patient's safety is maintained on unit.    Problem: Coping: Goal: Will verbalize feelings Outcome: Progressing   Problem: Safety: Goal: Ability to remain free from injury will improve Outcome: Progressing   Problem: Education: Goal: Knowledge of [place] [facility] Education information/materials will improve Outcome: Not Progressing Goal: Emotional status will improve Outcome: Not Progressing Goal: Mental status will improve Outcome: Not Progressing

## 2018-01-11 NOTE — BHH Group Notes (Signed)
[facility] Group Notes:  (Nursing/MHT/Case Management/Adjunct)  Date:  01/11/2018  Time:  12:53 AM  Type of Therapy:  Group Therapy  Participation Level:  Did Not Attend   Isabella [name] 01/11/2018, 12:53 AM

## 2018-01-11 NOTE — Progress Notes (Signed)
D: Pt. Interviewed with interpreter services. Pt denies SI/HI/AVH, and is able to verbally contract for safety. Pt. This evening isolative and withdrawn to her room, presenting with thought blocking and psychomotor retardation behavior. Pt. Is able to report she is depressed, "9/10". Pt. Denies pain. Pt. Has no complaints. No other notable observations. Pt. Continues to not eat very much at all. Pt. Does drink some fluids this evening.   A: Q x 15 minute observation checks were completed for safety. Patient was provided with education, but needs reinforcement.  Patient was given/offered medications per orders. Patient  was encourage to attend groups, participate in unit activities and continue with plan of care. Pt. Chart and plans of care reviewed. Pt. Given support and encouragement.    R: Patient is complaint with medication, but is unwilling to participate. Pt. Unable to be complaint with EKG testing and/or UA this evening. Will notify day shift of needed provider orders.               Precautionary checks every 15 minutes for safety maintained, room free of safety hazards, patient sustains no injury or falls during this shift. Will endorse care to next shift.

## 2018-01-11 NOTE — Plan of Care (Signed)
Pt. Denies SI/Hi and verbally is able to contract for safety. Pt. Verbalizes understanding of provided education, but needs reinforcement. Pt. Information obtained utilizing interpreter services. Pt. Isolative and withdrawn to her room. Pt. Reports high depression. Pt. Presents with psychomotor retardation and thought blocking behaviors.    Problem: Education: Goal: Knowledge of [place] [facility] Education information/materials will improve Outcome: Not Progressing Goal: Emotional status will improve Outcome: Not Progressing Goal: Mental status will improve Outcome: Not Progressing   Problem: Safety: Goal: Ability to remain free from injury will improve Outcome: Progressing

## 2018-01-11 NOTE — BHH Counselor (Signed)
Adult Comprehensive Assessment  Patient ID: Isabella [name], female   DOB: Feb 03, 1976, 42 y.o.   MRN: [id number]  Information Source: Information source: Patient   Current Stressors:  Patient states their primary concerns and needs for treatment are:: "Depression" Patient states their goals for this hospitilization and ongoing recovery are:: Unable to verbalize Employment / Job issues: Engineer, building services / Lack of resources (include bankruptcy): Fixed income Housing / Lack of housing: Dependent on parents Bereavement/loss: Chart reports that the patients uncle passed away 2 weeks ago.   Living/Environment/Situation:  Living Arrangements: Parent Living conditions (as described by patient or guardian): Pt lives with both parents in [place].  Pt reports this is a good environment.  How long has patient lived in current situation?:Bsically since the divorce What is atmosphere in current home: Loving, Comfortable, Supportive   Family History:  Marital status: Divorced Divorced, when?: 7-8 years ago What types of issues is patient dealing with in the relationship?: "some problems" Does patient have children?: Yes How many children?: 2 How is patient's relationship with their children?: 28 and 72 year old, don't talk to them too often, they live with their father in [place]   Childhood History:  By whom was/is the patient raised?: Both parents Additional childhood history information: Pt reports having a pretty good childhood.  Description of patient's relationship with caregiver when they were a child: Pt reports getting along well with parents growing up. Patient's description of current relationship with people who raised him/her: Pt reports being close to her parents today.  Did patient suffer any verbal/emotional/physical/sexual abuse as a child?: No Did patient suffer from severe childhood neglect?: No Has patient ever been sexually abused/assaulted/raped as an adolescent or  adult?: No Was the patient ever a victim of a crime or a disaster?: No Witnessed domestic violence?: No Has patient been effected by domestic violence as an adult?: No   Education:  Highest grade of school patient has completed: 10th Currently a student?: No Learning disability?: No   Employment/Work Situation:   Employment situation: Disability For mental health, several years What is the longest time patient has a held a job?: 2-3 years  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 Has patient ever been in the [employer]?: No Has patient ever served in combat?: No Did You Receive Any Psychiatric Treatment/Services While in Passenger transport manager?: No Are There Guns or Other Weapons in [place]?: No   Financial Resources:   Museum/gallery curator resources: Support from parents / caregiver Does patient have a Programmer, applications or guardian?: No   Alcohol/Substance Abuse:   What has been your use of drugs/alcohol within the last 12 months?: Pt denies, but last time pt was here mother indicated she was concerned about pt drinking alcohol-beer   If attempted suicide, did drugs/alcohol play a role in this?: No Alcohol/Substance Abuse Treatment Hx: Denies past history Has alcohol/substance abuse ever caused legal problems?: No Mother indicates that pt was jailed for 6 years in [place], charges unclear.  Also states that pt was first diagnosed in [place] in 2010.   Social Support System:   Patient's Community Support System: Good Describe Community Support System: Pt reports her parents are her main support.  Type of faith/religion: Pt denies How does patient's faith help to cope with current illness?: N/A   Leisure/Recreation:   Leisure and Hobbies: talk with friends     What is the patient's perception of their strengths?: Unable to answer Patient states they can use these personal  strengths during their treatment to contribute to their recovery: Unable to answer Patient states these  barriers may affect/interfere with their treatment: Unable to answer Patient states these barriers may affect their return to the community: Unable to answer Other important information patient would like considered in planning for their treatment: Unable to answer   Discharge Plan:   Currently receiving community mental health services: No Patient states concerns and preferences for aftercare planning are: [place] Patient states they will know when they are safe and ready for discharge when: Unable to answer Does patient have access to transportation?: Yes Does patient have financial barriers related to discharge medications?: No Will patient be returning to same living situation after discharge?: Yes  Summary/Recommendations:   Summary and Recommendations (to be completed by the evaluator): Patient is a 42 year old Hispanic female admitted voluntarily and diagnosed Schizophrenia, catatonic type. Her family brought her to the ED, stating she had been sick for "13 days," staying in bed, not eating, and lately not speaking. Furthermore, they report she last went to [facility] in November of 18, and stopped taking medication in February of this year.  Isabella [name] presents with increased latency of responses, minimal eye contact, and depressed affect. Chart reports a possible stressor associated with death of her uncle 2 weeks. Patient resides in [place] with both of her parents. She presents with increased latency of responses, minimal eye contact, and depressed affect.  At discharge, patient will return home and attend outpatient treatment. While here, patient will benefit from crisis stabilization, medication evaluation, group therapy and psychoeducation, in addition to case management for discharge planning. At discharge, it is recommended that patient remain compliant with the established discharge plan and continue treatment.   Darin Engels. 01/11/2018

## 2018-01-11 NOTE — Progress Notes (Signed)
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  Patient continues to be extremely withdrawn.  She had brief spells today of getting out of bed when 1 of the staff was able to coach her but is eating very little and has been refusing some medications.  Patient would not get up out of bed this evening to come and visit her parents who had come in quite a way to visit her.  She describes herself still as feeling bad but cannot put any more detail into it than that.  Patient's vital signs are stable.  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 Depression [F32.9] 01/10/2018  . Dehydration [E86.0] 01/10/2018  . Anorexia [R63.0] 01/10/2018  . Schizophrenia ([place]) [F20.9] 12/31/2017  . Social anxiety disorder [F40.10] 08/02/2016  . 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  Diagnosis of schizophrenia had been carried forward although much of her presentation looks like psychotic depression or bipolar depression to me.  Could have both.  Past Medical History:  Past Medical History:  Diagnosis Date  . Bipolar disorder ([place])   . Hypertension   . Migraine 07/13/2016  . Schizophrenia ([place])    History reviewed. No pertinent surgical history. Family History:  Family History  Problem Relation Age of Onset  . Mental illness Neg Hx    Family Psychiatric  History: None known Social History:  Social History   Substance and Sexual Activity  Alcohol Use No     Social History   Substance and Sexual Activity  Drug Use No    Social History   Socioeconomic  History  . Marital status: Single    Spouse name: Not on file  . Number of children: Not on file  . Years of education: Not on file  . Highest education level: Not on file  Occupational History  . Not on file  Social Needs  . Financial resource strain: Not on file  . Food insecurity:    Worry: Not on file    Inability: Not on file  . Transportation needs:    Medical: Not on file    Non-medical: Not on file  Tobacco Use  . Smoking status: Smoker, Current Status Unknown  . Smokeless tobacco: Never Used  Substance and Sexual Activity  . Alcohol use: No  . Drug use: No  . Sexual activity: Never  Lifestyle  . Physical activity:    Days per week: Not on file    Minutes per session: Not on file  . Stress: Not on file  Relationships  . Social connections:    Talks on phone: Not on file    Gets together: Not on file    Attends religious service: Not on file    Active member of club or organization: Not on file    Attends meetings of clubs or organizations: Not on file    Relationship status: Not on file  Other Topics Concern  . Not on file  Social History Narrative  . Not on file   Additional Social History:  Sleep: Poor  Appetite:  Poor  Current Medications: Current Facility-Administered Medications  Medication Dose Route Frequency Provider Last Rate Last Dose  . acetaminophen (TYLENOL) tablet 650 mg  650 mg Oral Q6H PRN Clapacs, John T, MD      . alum & mag hydroxide-simeth (MAALOX/MYLANTA) 200-200-20 MG/5ML suspension 30 mL  30 mL Oral Q4H PRN Clapacs, John T, MD      . feeding supplement (ENSURE ENLIVE) (ENSURE ENLIVE) liquid 237 mL  237 mL Oral TID BM Clapacs, John T, MD   237 mL at 01/11/18 1000  . FLUoxetine (PROZAC) capsule 20 mg  20 mg Oral Daily Clapacs, John T, MD   20 mg at 01/10/18 1736  . hydrOXYzine (ATARAX/VISTARIL) tablet 50 mg  50 mg Oral Q6H PRN Clapacs, Madie Reno, MD      . hyoscyamine (ANASPAZ) disintergrating tablet  0.125 mg  0.125 mg Oral Q6H PRN Clapacs, John T, MD      . magnesium hydroxide (MILK OF MAGNESIA) suspension 30 mL  30 mL Oral Daily PRN Clapacs, John T, MD      . OLANZapine zydis (ZYPREXA) disintegrating tablet 10 mg  10 mg Oral QHS Clapacs, Madie Reno, MD   10 mg at 01/10/18 2118  . traZODone (DESYREL) tablet 100 mg  100 mg Oral QHS PRN Clapacs, Madie Reno, MD        Lab Results:  Results for orders placed or performed during the hospital encounter of 01/10/18 (from the past 48 hour(s))  Basic metabolic panel     Status: Abnormal   Collection Time: 01/11/18  6:40 AM  Result Value Ref Range   Sodium 142 135 - 145 mmol/L   Potassium 3.2 (L) 3.5 - 5.1 mmol/L   Chloride 108 98 - 111 mmol/L   CO2 25 22 - 32 mmol/L   Glucose, Bld 87 70 - 99 mg/dL   BUN 19 6 - 20 mg/dL   Creatinine, Ser 0.36 (L) 0.44 - 1.00 mg/dL   Calcium 8.8 (L) 8.9 - 10.3 mg/dL   GFR calc non Af Amer >60 >60 mL/min   GFR calc Af Amer >60 >60 mL/min    Comment: (NOTE) The eGFR has been calculated using the CKD EPI equation. This calculation has not been validated in all clinical situations. eGFR's persistently <60 mL/min signify possible Chronic Kidney Disease.    Anion gap 9 5 - 15    Comment: Performed at [facility], [address] [place], [place] [address]  CBC with Differential/Platelet     Status: None   Collection Time: 01/11/18  6:40 AM  Result Value Ref Range   WBC 9.1 3.6 - 11.0 K/uL   RBC 4.29 3.80 - 5.20 MIL/uL   Hemoglobin 13.6 12.0 - 16.0 g/dL   HCT 39.1 35.0 - 47.0 %   MCV 91.2 80.0 - 100.0 fL   MCH 31.6 26.0 - 34.0 pg   MCHC 34.7 32.0 - 36.0 g/dL   RDW 13.9 11.5 - 14.5 %   Platelets 288 150 - 440 K/uL   Neutrophils Relative % 56 %   Neutro Abs 5.1 1.4 - 6.5 K/uL   Lymphocytes Relative 30 %   Lymphs Abs 2.8 1.0 - 3.6 K/uL   Monocytes Relative 8 %   Monocytes Absolute 0.7 0.2 - 0.9 K/uL   Eosinophils Relative 6 %   Eosinophils Absolute 0.5 0 - 0.7 K/uL   Basophils Relative 0 %    Basophils Absolute 0.0 0 - 0.1 K/uL  Comment: Performed at [facility], [address] [place], [place] [address]    Blood Alcohol level:  Lab Results  Component Value Date   [facility] <10 12/29/2017   ETH <5 87/68/1157    Metabolic Disorder Labs: Lab Results  Component Value Date   HGBA1C 5.4 01/31/2016   MPG 108 01/31/2016   Lab Results  Component Value Date   PROLACTIN 124.2 (H) 01/31/2016   Lab Results  Component Value Date   CHOL 152 01/31/2016   TRIG 147 01/31/2016   HDL 39 (L) 01/31/2016   CHOLHDL 3.9 01/31/2016   VLDL 29 01/31/2016   LDLCALC 84 01/31/2016    Physical Findings: AIMS:  , ,  ,  ,    CIWA:    COWS:     Musculoskeletal: Strength & Muscle Tone: decreased Gait & Station: Mostly unwilling to stand although when she did stand up she was able to walk fine. Patient leans: N/A  Psychiatric Specialty Exam: Physical Exam  Nursing note and vitals reviewed. Constitutional: She appears well-developed and well-nourished.  HENT:  Head: Normocephalic and atraumatic.  Eyes: Pupils are equal, round, and reactive to light. Conjunctivae are normal.  Neck: Normal range of motion.  Cardiovascular: Regular rhythm and normal heart sounds.  Respiratory: Effort normal. No respiratory distress.  GI: Soft.  Musculoskeletal: Normal range of motion.  Neurological: She is alert.  Skin: Skin is warm and dry.  Psychiatric: Her affect is blunt. She is slowed and withdrawn. She is not agitated and not aggressive. Cognition and memory are impaired. She expresses inappropriate judgment. She exhibits a depressed mood. She is noncommunicative.    Review of Systems  Unable to perform ROS: Psychiatric disorder    Blood pressure 118/66, pulse 66, temperature 98.6 F (37 C), temperature source Oral, resp. rate 16, height 5' 3"  (1.6 m), weight 81.6 kg, SpO2 99 %.Body mass index is 31.89 kg/m.  General Appearance: Disheveled  Eye Contact:  Minimal  Speech:  Slow   Volume:  Decreased  Mood:  Depressed  Affect:  Constricted and Depressed  Thought Process:  Disorganized  Orientation:  Full (Time, Place, and Person)  Thought Content:  Illogical  Suicidal Thoughts:  No  Homicidal Thoughts:  No  Memory:  Immediate;   Poor Recent;   Poor Remote;   Poor  Judgement:  Impaired  Insight:  Lacking  Psychomotor Activity:  Decreased  Concentration:  Concentration: Poor  Recall:  Poor  Fund of Knowledge:  Poor  Language:  Poor  Akathisia:  No  Handed:  Right  AIMS (if indicated):     Assets:  Housing Social Support  ADL's:  Impaired  Cognition:  Impaired,  Mild and Moderate  Sleep:  Number of Hours: 8     Treatment Plan Summary: Daily contact with patient to assess and evaluate symptoms and progress in treatment, Medication management and Plan Patient continues to appear to be at least at times partially catatonic with severe depression.  I continue to believe that ECT is the most appropriate treatment.  Mother and father both came this evening and met with me along with a Hospital provided [place] language interpreter.  I detailed for them the rationale for ECT and described the treatment and gave them opportunity to ask questions.  Patient's parents were agreeable to signing consent for ECT as the patient is unable or unwilling to make a decision for herself.  Plan is to go forward with bilateral ECT starting tomorrow morning.  Consent is signed  and on the chart.  No change to medication orders for now.  I have changed the EKG and urine pregnancy test to statin in the hopes that they will get done tonight.  [name], MD 01/11/2018, 7:46 PM

## 2018-01-11 NOTE — BHH Group Notes (Addendum)
01/11/2018 1PM  Type of Therapy/Topic:  Group Therapy:  Balance in Life  Participation Level:  Did Not Attend  Description of Group:   This group will address the concept of balance and how it feels and looks when one is unbalanced. Patients will be encouraged to process areas in their lives that are out of balance and identify reasons for remaining unbalanced. Facilitators will guide patients in utilizing problem-solving interventions to address and correct the stressor making their life unbalanced. Understanding and applying boundaries will be explored and addressed for obtaining and maintaining a balanced life. Patients will be encouraged to explore ways to assertively make their unbalanced needs known to significant others in their lives, using other group members and facilitator for support and feedback.  Therapeutic Goals: 1. Patient will identify two or more emotions or situations they have that consume much of in their lives. 2. Patient will identify signs/triggers that life has become out of balance:  3. Patient will identify two ways to set boundaries in order to achieve balance in their lives:  4. Patient will demonstrate ability to communicate their needs through discussion and/or role plays  Summary of Patient Progress: Patient was encouraged and invited to attend group. Patient did not attend group. Social worker will continue to encourage group participation in the future.        Therapeutic Modalities:   Cognitive Behavioral Therapy Solution-Focused Therapy Assertiveness Training  Darin Engels, [place]

## 2018-01-12 ENCOUNTER — Other Ambulatory Visit: Payer: Self-pay | Admitting: Psychiatry

## 2018-01-12 ENCOUNTER — Inpatient Hospital Stay: Payer: Medicaid Other | Admitting: Certified Registered Nurse Anesthetist

## 2018-01-12 ENCOUNTER — Inpatient Hospital Stay: Payer: Medicaid Other

## 2018-01-12 LAB — GLUCOSE, CAPILLARY: GLUCOSE-CAPILLARY: 78 mg/dL (ref 70–99)

## 2018-01-12 MED ORDER — SODIUM CHLORIDE 0.9 % IV SOLN
500.0000 mL | Freq: Once | INTRAVENOUS | Status: AC
  Administered 2018-01-12: 500 mL via INTRAVENOUS

## 2018-01-12 MED ORDER — METHOHEXITAL SODIUM 100 MG/10ML IV SOSY
PREFILLED_SYRINGE | INTRAVENOUS | Status: DC | PRN
  Administered 2018-01-12: 80 mg via INTRAVENOUS

## 2018-01-12 MED ORDER — SUCCINYLCHOLINE CHLORIDE 20 MG/ML IJ SOLN
INTRAMUSCULAR | Status: DC | PRN
  Administered 2018-01-12: 100 mg via INTRAVENOUS

## 2018-01-12 MED ORDER — SODIUM CHLORIDE 0.9 % IV SOLN
INTRAVENOUS | Status: DC | PRN
  Administered 2018-01-12: 11:00:00 via INTRAVENOUS

## 2018-01-12 NOTE — BHH Group Notes (Signed)
LCSW Group Therapy Note  01/12/2018 1:00 pm  Type of Therapy and Topic:  Group Therapy:  Feelings around Relapse and Recovery  Participation Level:  Did Not Attend   Description of Group:    Patients in this group will discuss emotions they experience before and after a relapse. They will process how experiencing these feelings, or avoidance of experiencing them, relates to having a relapse. Facilitator will guide patients to explore emotions they have related to recovery. Patients will be encouraged to process which emotions are more powerful. They will be guided to discuss the emotional reaction significant others in their lives may have to their relapse or recovery. Patients will be assisted in exploring ways to respond to the emotions of others without this contributing to a relapse.  Therapeutic Goals: 1. Patient will identify two or more emotions that lead to a relapse for them 2. Patient will identify two emotions that result when they relapse 3. Patient will identify two emotions related to recovery 4. Patient will demonstrate ability to communicate their needs through discussion and/or role plays   Summary of Patient Progress:  Isabella [name] was invited to today's group, but chose not to attend.   Therapeutic Modalities:   Cognitive Behavioral Therapy Solution-Focused Therapy Assertiveness Training Relapse Prevention Therapy   Devona Konig, LCSW 01/12/2018 1:43 PM

## 2018-01-12 NOTE — Progress Notes (Signed)
Recreation Therapy Notes  INPATIENT RECREATION THERAPY ASSESSMENT  [name] MRN: [id number] DOB: 1976-02-02 Today's Date: 01/12/2018       Information Obtained From: Patient  Able to Participate in Assessment/Interview: Yes  Patient Presentation: Responsive, Withdrawn  Reason for Admission (Per Patient): Active Symptoms, Med Non-Compliance  Patient Stressors:    Coping Skills:   TV  Leisure Interests (2+):  Social - Family, Social - Friends, Individual - TV  Frequency of Recreation/Participation: Weekly  Awareness of Community Resources:     [employer]:     Current Use:    If no, Barriers?:    Expressed Interest in [employer] Information:    [employer] of Residence:  Lobbyist  Patient Main Form of Transportation:    Patient Strengths:  I do not know  Patient Identified Areas of Improvement:  I do not know  Patient Goal for Hospitalization:  I do not know  Current SI (including self-harm):  No  Current HI:  No  Current AVH: No  Staff Intervention Plan: Group Attendance, Collaborate with Interdisciplinary Treatment Team  Consent to Intern Participation: N/A  Isabella [name] 01/12/2018, 3:09 PM

## 2018-01-12 NOTE — Progress Notes (Signed)
Patient speaks in a whisper and only with one word if at all. She has a flat affect and needs to be guided to do things such as laying in the bed or using the bathroom. Once the assessment was completed she went to sleep and was unphased by the noise around her.

## 2018-01-12 NOTE — Anesthesia Preprocedure Evaluation (Signed)
Anesthesia Evaluation  Patient identified by MRN, date of birth, ID band Patient awake    Reviewed: Allergy & Precautions, NPO status , Patient's Chart, lab work & pertinent test results  Airway Mallampati: III       Dental   Pulmonary neg pulmonary ROS,    Pulmonary exam normal        Cardiovascular hypertension, Normal cardiovascular exam     Neuro/Psych  Headaches, PSYCHIATRIC DISORDERS Anxiety Depression Bipolar Disorder Schizophrenia    GI/Hepatic negative GI ROS, Neg liver ROS,   Endo/Other  negative endocrine ROS  Renal/GU negative Renal ROS  negative genitourinary   Musculoskeletal negative musculoskeletal ROS (+)   Abdominal Normal abdominal exam  (+)   Peds negative pediatric ROS (+)  Hematology negative hematology ROS (+)   Anesthesia Other Findings   Reproductive/Obstetrics                             Anesthesia Physical Anesthesia Plan  ASA: II  Anesthesia Plan: General   Post-op Pain Management:    Induction: Intravenous  PONV Risk Score and Plan:   Airway Management Planned: Mask  Additional Equipment:   Intra-op Plan:   Post-operative Plan:   Informed Consent: I have reviewed the patients History and Physical, chart, labs and discussed the procedure including the risks, benefits and alternatives for the proposed anesthesia with the patient or authorized representative who has indicated his/her understanding and acceptance.   Dental advisory given  Plan Discussed with: CRNA and Surgeon  Anesthesia Plan Comments:         Anesthesia Quick Evaluation

## 2018-01-12 NOTE — Anesthesia Procedure Notes (Signed)
Procedure Name: General with mask airway Date/Time: 01/12/2018 11:37 AM Performed by: Johnna Acosta, CRNA Pre-anesthesia Checklist: Patient identified, Timeout performed, Emergency Drugs available, Suction available and Patient being monitored Patient Re-evaluated:Patient Re-evaluated prior to induction Oxygen Delivery Method: Circle system utilized Preoxygenation: Pre-oxygenation with 100% oxygen Induction Type: IV induction Ventilation: Mask ventilation without difficulty

## 2018-01-12 NOTE — Procedures (Signed)
ECT SERVICES Physician's Interval Evaluation & Treatment Note  Patient Identification: Isabella [name] MRN:  [id number] Date of Evaluation:  01/12/2018 TX #: 1  MADRS: Difficult to get a meaningful reading given how severely depressed she is reading is made at about 47  MMSE: 0.  Unobtainable.  P.E. Findings:  Stable physical exam stable vitals no sign of heart or lung disease  Psychiatric Interval Note:  Extremely withdrawn flat on interactive  Subjective:  Patient is a 42 y.o. female seen for evaluation for Electroconvulsive Therapy. Feeling bad  Treatment Summary:   []   Right Unilateral             [x]  Bilateral   % Energy : 1.0 ms 30%   Impedance: 930 ohms  Seizure Energy Index: 25,157 V squared  Postictal Suppression Index: 93%  Seizure Concordance Index: 99%  Medications  Pre Shock: Brevital 80 mg succinylcholine 100 mg  Post Shock:    Seizure Duration: 31 seconds by EMG 51 seconds by EEG   Comments: Continue inpatient treatment  Lungs:  [x]   Clear to auscultation               []  Other:   Heart:    [x]   Regular rhythm             []  irregular rhythm    [x]   Previous H&P reviewed, patient examined and there are NO CHANGES                 []   Previous H&P reviewed, patient examined and there are changes noted.   [name], MD 8/30/201911:33 AM

## 2018-01-12 NOTE — Plan of Care (Signed)
Patient verbalized that she is "feeling little better".Patients affect is sad and blunted.Isolated in the room,refused to get up for dinner.Ate only 25% of  lunch.Denies SI,HI and AVH.Compliant with medications.Support and encouragement given.

## 2018-01-12 NOTE — Anesthesia Postprocedure Evaluation (Signed)
Anesthesia Post Note  Patient: Isabella [name]  Procedure(s) Performed: ECT TX  Patient location during evaluation: PACU Anesthesia Type: General Level of consciousness: awake and alert and oriented Pain management: pain level controlled Vital Signs Assessment: post-procedure vital signs reviewed and stable Respiratory status: spontaneous breathing Cardiovascular status: blood pressure returned to baseline Anesthetic complications: no     Last Vitals:  Vitals:   01/12/18 1220 01/12/18 1619  BP: 117/84 106/60  Pulse: 93 75  Resp: 18   Temp: 37.3 C 36.5 C  SpO2: 96% 98%    Last Pain:  Vitals:   01/12/18 1619  TempSrc: Oral  PainSc:                  Moataz Tavis

## 2018-01-12 NOTE — Progress Notes (Signed)
Translator Lacretia Leigh used for assessment and explanation of services provided.

## 2018-01-12 NOTE — Progress Notes (Signed)
Recreation Therapy Notes  Date: 01/12/2018  Time: 9:30 am   Location: Outside   Behavioral response: N/A   Intervention Topic: Leisure  Discussion/Intervention: Patient did not attend group.   Clinical Observations/Feedback:  Patient did not attend group.   Jalisha Enneking LRT/CTRS        Darly Massi 01/12/2018 12:44 PM

## 2018-01-12 NOTE — H&P (Signed)
Isabella [name] is an 42 y.o. female.   Chief Complaint: Patient has only the complaint that she feels bad nothing really more specific HPI: Patient with severe depression extremely withdrawn  Past Medical History:  Diagnosis Date  . Bipolar disorder ([place])   . Hypertension   . Migraine 07/13/2016  . Schizophrenia ([place])     History reviewed. No pertinent surgical history.  Family History  Problem Relation Age of Onset  . Mental illness Neg Hx    Social History:  reports that she has been smoking. She has never used smokeless tobacco. She reports that she does not drink alcohol or use drugs.  Allergies: No Known Allergies  Medications Prior to Admission  Medication Sig Dispense Refill  . acetaminophen (TYLENOL) 325 MG tablet Take 2 tablets (650 mg total) by mouth every 6 (six) hours as needed for mild pain.    Marland Kitchen alum & mag hydroxide-simeth (MAALOX/MYLANTA) 200-200-20 MG/5ML suspension Take 30 mLs by mouth every 4 (four) hours as needed for indigestion. 355 mL 0  . ARIPiprazole (ABILIFY) 20 MG tablet Take 1 tablet (20 mg total) by mouth daily. For mood control    . escitalopram (LEXAPRO) 5 MG tablet Take 3 tablets (15 mg total) by mouth daily. For depression    . feeding supplement, ENSURE ENLIVE, (ENSURE ENLIVE) LIQD Take 237 mLs by mouth 2 (two) times daily between meals. For nutritional supplementation 237 mL 12  . hydrOXYzine (ATARAX/VISTARIL) 50 MG tablet Take 1 tablet (50 mg total) by mouth every 6 (six) hours as needed for anxiety. 1 tablet 0  . hyoscyamine (ANASPAZ) 0.125 MG TBDP disintergrating tablet Take 1 tablet (0.125 mg total) by mouth every 4 (four) hours as needed for cramping.  0  . magnesium hydroxide (MILK OF MAGNESIA) 400 MG/5ML suspension Take 30 mLs by mouth daily as needed for mild constipation. 360 mL 0  . OLANZapine zydis (ZYPREXA) 5 MG disintegrating tablet Take 1 tablet (5 mg total) by mouth every 8 (eight) hours as needed (agitation/psychosis).    . traZODone  (DESYREL) 100 MG tablet Take 1 tablet (100 mg total) by mouth at bedtime as needed for sleep.      Results for orders placed or performed during the hospital encounter of 01/10/18 (from the past 48 hour(s))  Basic metabolic panel     Status: Abnormal   Collection Time: 01/11/18  6:40 AM  Result Value Ref Range   Sodium 142 135 - 145 mmol/L   Potassium 3.2 (L) 3.5 - 5.1 mmol/L   Chloride 108 98 - 111 mmol/L   CO2 25 22 - 32 mmol/L   Glucose, Bld 87 70 - 99 mg/dL   BUN 19 6 - 20 mg/dL   Creatinine, Ser 0.36 (L) 0.44 - 1.00 mg/dL   Calcium 8.8 (L) 8.9 - 10.3 mg/dL   GFR calc non Af Amer >60 >60 mL/min   GFR calc Af Amer >60 >60 mL/min    Comment: (NOTE) The eGFR has been calculated using the CKD EPI equation. This calculation has not been validated in all clinical situations. eGFR's persistently <60 mL/min signify possible Chronic Kidney Disease.    Anion gap 9 5 - 15    Comment: Performed at [facility], [address] [place],  [address]  CBC with Differential/Platelet     Status: None   Collection Time: 01/11/18  6:40 AM  Result Value Ref Range   WBC 9.1 3.6 - 11.0 K/uL   RBC 4.29 3.80 - 5.20 MIL/uL  Hemoglobin 13.6 12.0 - 16.0 g/dL   HCT 39.1 35.0 - 47.0 %   MCV 91.2 80.0 - 100.0 fL   MCH 31.6 26.0 - 34.0 pg   MCHC 34.7 32.0 - 36.0 g/dL   RDW 13.9 11.5 - 14.5 %   Platelets 288 150 - 440 K/uL   Neutrophils Relative % 56 %   Neutro Abs 5.1 1.4 - 6.5 K/uL   Lymphocytes Relative 30 %   Lymphs Abs 2.8 1.0 - 3.6 K/uL   Monocytes Relative 8 %   Monocytes Absolute 0.7 0.2 - 0.9 K/uL   Eosinophils Relative 6 %   Eosinophils Absolute 0.5 0 - 0.7 K/uL   Basophils Relative 0 %   Basophils Absolute 0.0 0 - 0.1 K/uL    Comment: Performed at [facility], [address] [place], [place] [address]  Pregnancy, urine     Status: None   Collection Time: 01/11/18  9:45 PM  Result Value Ref Range   Preg Test, Ur NEGATIVE NEGATIVE    Comment: Performed at  [facility], [address] [place], [place] [address]  Glucose, capillary     Status: None   Collection Time: 01/12/18  6:20 AM  Result Value Ref Range   Glucose-Capillary 78 70 - 99 mg/dL   No results found.  Review of Systems  Unable to perform ROS: Psychiatric disorder    Blood pressure 99/83, pulse 78, temperature 98.5 F (36.9 C), temperature source Oral, resp. rate 16, height 5' 3"  (1.6 m), weight 82.6 kg, SpO2 94 %. Physical Exam  Nursing note and vitals reviewed. Constitutional: She appears well-developed and well-nourished.  HENT:  Head: Normocephalic and atraumatic.  Eyes: Pupils are equal, round, and reactive to light. Conjunctivae are normal.  Neck: Normal range of motion.  Cardiovascular: Regular rhythm and normal heart sounds.  Respiratory: Effort normal. No respiratory distress.  GI: Soft.  Musculoskeletal: Normal range of motion.  Neurological: She is alert.  Skin: Skin is warm and dry.  Psychiatric: Her affect is blunt. She is withdrawn. She is noncommunicative.     Assessment/Plan Patient is physically stable for ECT which will be starting today with bilateral treatment going forward 3 times a week schedule.  [name], MD 01/12/2018, 11:32 AM

## 2018-01-12 NOTE — [facility] Group Notes (Signed)
[place] Group Notes:  (Nursing/MHT/Case Management/Adjunct)  Date:  01/12/2018  Time:  1:59 AM  Type of Therapy:  Group Therapy  Participation Level:  Did Not Attend  Isabella [name] 01/12/2018, 1:59 AM

## 2018-01-12 NOTE — Progress Notes (Signed)
[facility] MD Progress Note  01/12/2018 4:15 PM Isabella [name]  MRN:  [id number] Subjective: Follow-up for this patient who carries a diagnosis of schizophrenia but who is presenting with symptoms of severe and withdrawn depression.  Patient had ECT for the first time this morning.  Bilateral treatment done without any complication.  This afternoon the patient is able to speak a little more clearly.  Able to understand her voice more.  Still only speaks a few words but tells [place] that she is feeling "a little better".  No new complaints. 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 Depression [F32.9] 01/10/2018  . Dehydration [E86.0] 01/10/2018  . Anorexia [R63.0] 01/10/2018  . Schizophrenia ([place]) [F20.9] 12/31/2017  . Social anxiety disorder [F40.10] 08/02/2016  . 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  She has had several hospitalizations just in the past year or so.  Diagnosis had been made of schizophrenia although it seems to me that her presentation is more like profound psychotic depression.  Patient had not shown consistent response to medication  Past Medical History:  Past Medical History:  Diagnosis Date  . Bipolar disorder ([place])   . Hypertension   . Migraine 07/13/2016  . Schizophrenia ([place])    History reviewed. No pertinent surgical history. Family History:  Family History  Problem Relation Age of Onset  . Mental illness Neg Hx    Family Psychiatric  History: None known Social History:  Social History   Substance and Sexual Activity  Alcohol Use No     Social History   Substance and Sexual Activity  Drug Use No    Social History   Socioeconomic History  . Marital status: Single    Spouse name: Not on file  . Number of children: Not on  file  . Years of education: Not on file  . Highest education level: Not on file  Occupational History  . Not on file  Social Needs  . Financial resource strain: Not on file  . Food insecurity:    Worry: Not on file    Inability: Not on file  . Transportation needs:    Medical: Not on file    Non-medical: Not on file  Tobacco Use  . Smoking status: Smoker, Current Status Unknown  . Smokeless tobacco: Never Used  Substance and Sexual Activity  . Alcohol use: No  . Drug use: No  . Sexual activity: Never  Lifestyle  . Physical activity:    Days per week: Not on file    Minutes per session: Not on file  . Stress: Not on file  Relationships  . Social connections:    Talks on phone: Not on file    Gets together: Not on file    Attends religious service: Not on file    Active member of club or organization: Not on file    Attends meetings of clubs or organizations: Not on file    Relationship status: Not on file  Other Topics Concern  . Not on file  Social History Narrative  . Not on file   Additional Social History:                         Sleep: Fair  Appetite:  Poor  Current Medications:  Current Facility-Administered Medications  Medication Dose Route Frequency Provider Last Rate Last Dose  . acetaminophen (TYLENOL) tablet 650 mg  650 mg Oral Q6H PRN Josy Peaden T, MD      . alum & mag hydroxide-simeth (MAALOX/MYLANTA) 200-200-20 MG/5ML suspension 30 mL  30 mL Oral Q4H PRN Sherrica Niehaus T, MD      . feeding supplement (ENSURE ENLIVE) (ENSURE ENLIVE) liquid 237 mL  237 mL Oral TID BM Brody Bonneau T, MD   237 mL at 01/12/18 1404  . FLUoxetine (PROZAC) capsule 20 mg  20 mg Oral Daily Ely Ballen T, MD   20 mg at 01/12/18 1245  . hydrOXYzine (ATARAX/VISTARIL) tablet 50 mg  50 mg Oral Q6H PRN Addeline Calarco, Madie Reno, MD      . hyoscyamine (ANASPAZ) disintergrating tablet 0.125 mg  0.125 mg Oral Q6H PRN Augustus Zurawski T, MD      . magnesium hydroxide (MILK OF  MAGNESIA) suspension 30 mL  30 mL Oral Daily PRN Lashaun Poch T, MD      . OLANZapine zydis (ZYPREXA) disintegrating tablet 10 mg  10 mg Oral QHS Zamarian Scarano, Madie Reno, MD   10 mg at 01/11/18 2135  . traZODone (DESYREL) tablet 100 mg  100 mg Oral QHS PRN Idaly Verret, Madie Reno, MD   100 mg at 01/11/18 2135    Lab Results:  Results for orders placed or performed during the hospital encounter of 01/10/18 (from the past 48 hour(s))  Basic metabolic panel     Status: Abnormal   Collection Time: 01/11/18  6:40 AM  Result Value Ref Range   Sodium 142 135 - 145 mmol/L   Potassium 3.2 (L) 3.5 - 5.1 mmol/L   Chloride 108 98 - 111 mmol/L   CO2 25 22 - 32 mmol/L   Glucose, Bld 87 70 - 99 mg/dL   BUN 19 6 - 20 mg/dL   Creatinine, Ser 0.36 (L) 0.44 - 1.00 mg/dL   Calcium 8.8 (L) 8.9 - 10.3 mg/dL   GFR calc non Af Amer >60 >60 mL/min   GFR calc Af Amer >60 >60 mL/min    Comment: (NOTE) The eGFR has been calculated using the CKD EPI equation. This calculation has not been validated in all clinical situations. eGFR's persistently <60 mL/min signify possible Chronic Kidney Disease.    Anion gap 9 5 - 15    Comment: Performed at [facility], [address] [place], [place] [address]  CBC with Differential/Platelet     Status: None   Collection Time: 01/11/18  6:40 AM  Result Value Ref Range   WBC 9.1 3.6 - 11.0 K/uL   RBC 4.29 3.80 - 5.20 MIL/uL   Hemoglobin 13.6 12.0 - 16.0 g/dL   HCT 39.1 35.0 - 47.0 %   MCV 91.2 80.0 - 100.0 fL   MCH 31.6 26.0 - 34.0 pg   MCHC 34.7 32.0 - 36.0 g/dL   RDW 13.9 11.5 - 14.5 %   Platelets 288 150 - 440 K/uL   Neutrophils Relative % 56 %   Neutro Abs 5.1 1.4 - 6.5 K/uL   Lymphocytes Relative 30 %   Lymphs Abs 2.8 1.0 - 3.6 K/uL   Monocytes Relative 8 %   Monocytes Absolute 0.7 0.2 - 0.9 K/uL   Eosinophils Relative 6 %   Eosinophils Absolute 0.5 0 - 0.7 K/uL   Basophils Relative 0 %   Basophils Absolute 0.0 0 - 0.1 K/uL    Comment: Performed at [facility], [address] [place], [place] [address]  Pregnancy, urine     Status: None   Collection Time: 01/11/18  9:45 PM  Result Value Ref Range   Preg Test, Ur NEGATIVE NEGATIVE    Comment: Performed at [facility], [address] [place], [place] [address]  Glucose, capillary     Status: None   Collection Time: 01/12/18  6:20 AM  Result Value Ref Range   Glucose-Capillary 78 70 - 99 mg/dL    Blood Alcohol level:  Lab Results  Component Value Date   ETH <10 12/29/2017   ETH <5 59/74/1638    Metabolic Disorder Labs: Lab Results  Component Value Date   HGBA1C 5.4 01/31/2016   MPG 108 01/31/2016   Lab Results  Component Value Date   PROLACTIN 124.2 (H) 01/31/2016   Lab Results  Component Value Date   CHOL 152 01/31/2016   TRIG 147 01/31/2016   HDL 39 (L) 01/31/2016   CHOLHDL 3.9 01/31/2016   VLDL 29 01/31/2016   LDLCALC 84 01/31/2016    Physical Findings: AIMS:  , ,  ,  ,    CIWA:    COWS:     Musculoskeletal: Strength & Muscle Tone: decreased Gait & Station: normal Patient leans: N/A  Psychiatric Specialty Exam: Physical Exam  Nursing note and vitals reviewed. Constitutional: She appears well-developed and well-nourished.  HENT:  Head: Normocephalic and atraumatic.  Eyes: Pupils are equal, round, and reactive to light. Conjunctivae are normal.  Neck: Normal range of motion.  Cardiovascular: Regular rhythm and normal heart sounds.  Respiratory: Effort normal. No respiratory distress.  GI: Soft.  Musculoskeletal: Normal range of motion.  Neurological: She is alert.  Skin: Skin is warm and dry.  Psychiatric: Her affect is blunt. Her speech is delayed. She is slowed and withdrawn. Cognition and memory are impaired.    Review of Systems  Unable to perform ROS: Psychiatric disorder    Blood pressure 117/84, pulse 93, temperature 99.1 F (37.3 C), resp. rate 18, height 5' 3"  (1.6 m), weight 82.6 kg, SpO2 96 %.Body mass index is 32.24  kg/m.  General Appearance: Disheveled  Eye Contact:  Minimal  Speech:  Slow  Volume:  Decreased  Mood:  Dysphoric  Affect:  Constricted  Thought Process:  Disorganized  Orientation:  Negative  Thought Content:  Rumination  Suicidal Thoughts:  No  Homicidal Thoughts:  No  Memory:  Immediate;   Fair  Judgement:  Fair  Insight:  Fair  Psychomotor Activity:  Decreased  Concentration:  Concentration: Fair  Recall:  Poor  Fund of Knowledge:  Fair  Language:  Fair  Akathisia:  No  Handed:  Right  AIMS (if indicated):     Assets:  Desire for Improvement Housing Physical Health Resilience Social Support  ADL's:  Impaired  Cognition:  Impaired,  Mild  Sleep:  Number of Hours: 7.5     Treatment Plan Summary: Plan Patient remains very impaired.  She barely gets out of bed much of the time and speaks very little.  Nevertheless, I think it is a good sign that she showed a little glimmer of improvement this afternoon after her first ECT treatment.  She had no complaints and appeared to not be in significant pain.  I am not changing any of her medications for now.  Next ECT treatment unfortunately will be delayed until Wednesday because of the holiday.  Continue to encourage patient and staff to get her up out of  bed and make sure she is staying well-hydrated.  [name], MD 01/12/2018, 4:15 PM

## 2018-01-12 NOTE — Tx Team (Addendum)
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 acetaminophen (TYLENOL) tablet 650 mg  650 mg Oral Q6H PRN Clapacs, John T, MD      . alum & mag hydroxide-simeth (MAALOX/MYLANTA) 200-200-20 MG/5ML suspension 30 mL  30 mL Oral Q4H PRN Clapacs, John T, MD      . feeding supplement (ENSURE ENLIVE) (ENSURE ENLIVE) liquid 237 mL  237 mL Oral TID BM Clapacs, John T, MD   237 mL at 01/12/18 1404  . FLUoxetine (PROZAC) capsule 20 mg  20 mg Oral Daily Clapacs, John T, MD   20 mg at 01/12/18 1245  . hydrOXYzine (ATARAX/VISTARIL) tablet 50 mg  50 mg Oral Q6H PRN Clapacs, Madie Reno, MD      . hyoscyamine (ANASPAZ) disintergrating tablet 0.125 mg  0.125 mg Oral Q6H PRN Clapacs, John T, MD      . magnesium hydroxide (MILK OF MAGNESIA) suspension 30 mL  30 mL Oral Daily PRN Clapacs, John T, MD      . OLANZapine zydis (ZYPREXA) disintegrating tablet 10 mg  10 mg Oral QHS Clapacs, Madie Reno, MD   10 mg at 01/11/18 2135  . traZODone (DESYREL) tablet 100 mg  100 mg Oral QHS PRN Clapacs, Madie Reno, MD   100 mg at 01/11/18 2135   PTA Medications: Medications Prior to Admission  Medication Sig Dispense Refill Last Dose  . acetaminophen (TYLENOL) 325 MG tablet Take 2 tablets (650 mg total) by mouth every 6 (six) hours as needed for mild pain.     Marland Kitchen alum & mag hydroxide-simeth (MAALOX/MYLANTA) 200-200-20 MG/5ML suspension Take 30 mLs by mouth every 4 (four) hours as needed for indigestion. 355 mL 0   . ARIPiprazole (ABILIFY) 20 MG tablet Take 1 tablet (20 mg total) by mouth daily. For mood control     .  escitalopram (LEXAPRO) 5 MG tablet Take 3 tablets (15 mg total) by mouth daily. For depression     . feeding supplement, ENSURE ENLIVE, (ENSURE ENLIVE) LIQD Take 237 mLs by mouth 2 (two) times daily between meals. For nutritional supplementation 237 mL 12   . hydrOXYzine (ATARAX/VISTARIL) 50 MG tablet Take 1 tablet (50 mg total) by mouth every 6 (six) hours as needed for anxiety. 1 tablet 0   . hyoscyamine (ANASPAZ) 0.125 MG TBDP disintergrating tablet Take 1 tablet (0.125 mg total) by mouth every 4 (four) hours as needed for cramping.  0   . magnesium hydroxide (MILK OF MAGNESIA) 400 MG/5ML suspension Take 30 mLs by mouth daily as needed for mild constipation. 360 mL 0   . OLANZapine zydis (ZYPREXA) 5 MG disintegrating tablet Take 1 tablet (5 mg total) by mouth every 8 (eight) hours as needed (agitation/psychosis).     . traZODone (DESYREL) 100 MG tablet Take 1 tablet (100 mg total) by mouth at bedtime as needed for sleep.       Patient Stressors: Health problems Medication change or noncompliance  Patient Strengths: Ability for insight Capable of independent living Physical Health Supportive family/friends  Treatment Modalities: Medication Management, Group therapy, Case management,  1 to 1 session with clinician, Psychoeducation, Recreational therapy.   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  Therapeutic Interventions: 1 to 1 sessions, Unit Group sessions and  Medication administration.  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  Therapeutic Interventions: 1 to 1 sessions, Unit Group sessions and Medication administration.  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 RN will report any adverse and/or side effects to prescribing provider.  Therapeutic Interventions: 1 on 1 counseling sessions, Psychoeducation, Medication administration, Evaluate responses to treatment, Monitor vital signs and CBGs as ordered, Perform/monitor CIWA, COWS, AIMS and Fall Risk screenings as ordered, Perform wound care treatments as ordered.  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  Short Term Goals: Engage patient in aftercare planning with referrals and resources, Increase social support, Increase ability to appropriately verbalize feelings, Identify triggers associated with mental health/substance abuse issues and Increase skills for wellness and recovery  Therapeutic Interventions: Assess for all discharge needs, 1 to 1 time with Social worker, Explore available resources and support systems, Assess for adequacy in community support network, Educate family and significant other(s) on suicide prevention, Complete Psychosocial Assessment, Interpersonal group therapy.  Evaluation of Outcomes: Progressing   Progress in Treatment: Attending groups: No. Participating in groups: No. Taking medication as prescribed: Yes. Toleration medication: Yes. Family/Significant other contact made: Yes, individual(s) contacted:  Patients daughter-in-law Patient understands diagnosis: Yes. Discussing patient identified problems/goals with staff: Yes. Medical problems stabilized or resolved: Yes. Denies suicidal/homicidal ideation: Yes. Issues/concerns per patient self-inventory: No. Other:  New problem(s) identified: No, Describe:  None  New Short Term/Long Term Goal(s):  To feel better  Patient Goals:  To feel better  Discharge Plan or Barriers: To return home and follow up with outpatient treatment.  Reason for Continuation of Hospitalization: Depression Medication stabilization  Estimated Length of Stay: 7 days  Patient Stressors: N/A Patient Goal: Patient will engage in groups without  prompting or encouragement from LRT x3 group sessions within 5 recreation therapy group sessions  Attendees: Patient: Isabella [name] 01/12/2018 2:15 PM  Physician: [name], MD 01/12/2018 2:15  PM  Nursing:  01/12/2018 2:15 PM  RN Care Manager: 01/12/2018 2:15 PM  Social Worker: Darin Engels, [place] 01/12/2018 2:15 PM  Recreational Therapist:  01/12/2018 2:15 PM  Other:  01/12/2018 2:15 PM  Other:  01/12/2018 2:15 PM  Other: 01/12/2018 2:15 PM    Scribe for Treatment Team: Darin Engels, LCSW 01/12/2018 2:15 PM

## 2018-01-12 NOTE — Transfer of Care (Signed)
Immediate Anesthesia Transfer of Care Note  Patient: Isabella [name]  Procedure(s) Performed: ECT TX  Patient Location: PACU  Anesthesia Type:General  Level of Consciousness: sedated  Airway & Oxygen Therapy: Patient Spontanous Breathing and Patient connected to face mask oxygen  Post-op Assessment: Report given to RN and Post -op Vital signs reviewed and stable  Post vital signs: Reviewed and stable  Last Vitals:  Vitals Value Taken Time  BP 131/82 01/12/2018 11:50 AM  Temp 37.7 C 01/12/2018 11:50 AM  Pulse 79 01/12/2018 11:50 AM  Resp 18 01/12/2018 11:50 AM  SpO2 99 % 01/12/2018 11:50 AM  Vitals shown include unvalidated device data.  Last Pain:  Vitals:   01/12/18 1011  TempSrc: Oral  PainSc: 0-No pain         Complications: No apparent anesthesia complications

## 2018-01-12 NOTE — Anesthesia Post-op Follow-up Note (Signed)
Anesthesia QCDR form completed.        

## 2018-01-13 LAB — GLUCOSE, CAPILLARY: Glucose-Capillary: 85 mg/dL (ref 70–99)

## 2018-01-13 NOTE — Progress Notes (Signed)
D: Patient denies SI/HI/AVH, affect is flat, she isolates to self in the room, unwilling to participate in treatment plan. Patient stated " l feel  better from doing ECT,she appears less anxious minimal interaction with staff appropriately.  A: Patient  was offered support and encouragement, and  given scheduled medications. She was encouraged to attend groups. Q 15 minute checks were done for safety.  R:Pt attends groups and interacts well with peers and staff, complaint with medication   and  safety maintained on unit.

## 2018-01-13 NOTE — Progress Notes (Signed)
[facility] MD Progress Note  01/13/2018 12:01 PM Jacalynn Buzzell  MRN:  [id number] Subjective:  Pt has a Dx of schizophrenia, but presents with severe depression with social withdrawn, and poor po intake. She just received her 1st ECT yesterday.  She is seen in her room around noon today, and she is still in bed, although awake.  She agreed to have lunch, but also stated "not now".  She said that she slept "so-so" last night. Denied SI or AVH.    RN: reported that she did not eat her breakfast this morning, but did drink an 8 oz Ensure.   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 Depression [F32.9] 01/10/2018  . Dehydration [E86.0] 01/10/2018  . Anorexia [R63.0] 01/10/2018  . Schizophrenia ([place]) [F20.9] 12/31/2017  . Social anxiety disorder [F40.10] 08/02/2016  . 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  She has had several hospitalizations just in the past year or so.  Diagnosis had been made of schizophrenia although it seems to me that her presentation is more like profound psychotic depression.  Patient had not shown consistent response to medication  Past Medical History:  Past Medical History:  Diagnosis Date  . Bipolar disorder ([place])   . Hypertension   . Migraine 07/13/2016  . Schizophrenia ([place])    History reviewed. No pertinent surgical history. Family History:  Family History  Problem Relation Age of Onset  . Mental illness Neg Hx    Family Psychiatric  History: None known Social History:  Social History   Substance and Sexual Activity  Alcohol Use No     Social History   Substance and Sexual Activity  Drug Use No    Social History   Socioeconomic History  . Marital status: Single    Spouse name: Not on file  . Number of children: Not  on file  . Years of education: Not on file  . Highest education level: Not on file  Occupational History  . Not on file  Social Needs  . Financial resource strain: Not on file  . Food insecurity:    Worry: Not on file    Inability: Not on file  . Transportation needs:    Medical: Not on file    Non-medical: Not on file  Tobacco Use  . Smoking status: Smoker, Current Status Unknown  . Smokeless tobacco: Never Used  Substance and Sexual Activity  . Alcohol use: No  . Drug use: No  . Sexual activity: Never  Lifestyle  . Physical activity:    Days per week: Not on file    Minutes per session: Not on file  . Stress: Not on file  Relationships  . Social connections:    Talks on phone: Not on file    Gets together: Not on file    Attends religious service: Not on file    Active member of club or organization: Not on file    Attends meetings of clubs or organizations: Not on file    Relationship status: Not on file  Other Topics Concern  . Not on file  Social History Narrative  . Not on file   Additional Social History:   Sleep: Fair  Appetite:  Poor  Current Medications: Current Facility-Administered Medications  Medication Dose Route Frequency Provider Last  Rate Last Dose  . acetaminophen (TYLENOL) tablet 650 mg  650 mg Oral Q6H PRN Clapacs, John T, MD      . alum & mag hydroxide-simeth (MAALOX/MYLANTA) 200-200-20 MG/5ML suspension 30 mL  30 mL Oral Q4H PRN Clapacs, John T, MD      . feeding supplement (ENSURE ENLIVE) (ENSURE ENLIVE) liquid 237 mL  237 mL Oral TID BM Clapacs, John T, MD   237 mL at 01/13/18 1042  . FLUoxetine (PROZAC) capsule 20 mg  20 mg Oral Daily Clapacs, John T, MD   20 mg at 01/13/18 0744  . hydrOXYzine (ATARAX/VISTARIL) tablet 50 mg  50 mg Oral Q6H PRN Clapacs, Madie Reno, MD      . hyoscyamine (ANASPAZ) disintergrating tablet 0.125 mg  0.125 mg Oral Q6H PRN Clapacs, John T, MD      . magnesium hydroxide (MILK OF MAGNESIA) suspension 30 mL  30 mL Oral  Daily PRN Clapacs, John T, MD      . OLANZapine zydis (ZYPREXA) disintegrating tablet 10 mg  10 mg Oral QHS Clapacs, Madie Reno, MD   10 mg at 01/12/18 2222  . traZODone (DESYREL) tablet 100 mg  100 mg Oral QHS PRN Clapacs, Madie Reno, MD   100 mg at 01/11/18 2135    Lab Results:  Results for orders placed or performed during the hospital encounter of 01/10/18 (from the past 48 hour(s))  Pregnancy, urine     Status: None   Collection Time: 01/11/18  9:45 PM  Result Value Ref Range   Preg Test, Ur NEGATIVE NEGATIVE    Comment: Performed at [facility], [address] [place], [place] [address]  Glucose, capillary     Status: None   Collection Time: 01/12/18  6:20 AM  Result Value Ref Range   Glucose-Capillary 78 70 - 99 mg/dL  Glucose, capillary     Status: None   Collection Time: 01/13/18  7:05 AM  Result Value Ref Range   Glucose-Capillary 85 70 - 99 mg/dL   Comment 1 Notify RN     Blood Alcohol level:  Lab Results  Component Value Date   ETH <10 12/29/2017   ETH <5 96/22/2979    Metabolic Disorder Labs: Lab Results  Component Value Date   HGBA1C 5.4 01/31/2016   MPG 108 01/31/2016   Lab Results  Component Value Date   PROLACTIN 124.2 (H) 01/31/2016   Lab Results  Component Value Date   CHOL 152 01/31/2016   TRIG 147 01/31/2016   HDL 39 (L) 01/31/2016   CHOLHDL 3.9 01/31/2016   VLDL 29 01/31/2016   LDLCALC 84 01/31/2016    Physical Findings: AIMS:  , ,  ,  ,    CIWA:    COWS:     Musculoskeletal: Strength & Muscle Tone: decreased Gait & Station: normal Patient leans: N/A  Psychiatric Specialty Exam: Physical Exam  Nursing note and vitals reviewed. Constitutional: She appears well-developed and well-nourished.  HENT:  Head: Normocephalic and atraumatic.  Eyes: Pupils are equal, round, and reactive to light. Conjunctivae are normal.  Neck: Normal range of motion.  Cardiovascular: Regular rhythm and normal heart sounds.  Respiratory: Effort  normal. No respiratory distress.  GI: Soft.  Musculoskeletal: Normal range of motion.  Neurological: She is alert.  Skin: Skin is warm and dry.  Psychiatric: Her affect is blunt. Her speech is delayed. She is slowed and withdrawn. Cognition and memory are impaired.    Review of Systems  Unable to perform  ROS: Psychiatric disorder    Blood pressure (!) 98/53, pulse 65, temperature 98.7 F (37.1 C), temperature source Oral, resp. rate 16, height 5\' 3"  (1.6 m), weight 82.6 kg, SpO2 98 %.Body mass index is 32.24 kg/m.  General Appearance: Casual  Eye Contact:  Poor  Speech:  Slow  Volume:  Decreased  Mood:  Dysphoric  Affect:  Blunt and Constricted  Thought Process:  Disorganized  Orientation:  Other:  to self and place  Thought Content:  Negative  Suicidal Thoughts:  No  Homicidal Thoughts:  No  Memory:  Immediate;   Fair Recent;   Fair Remote;   Fair  Judgement:  Fair  Insight:  Lacking  Psychomotor Activity:  Decreased  Concentration:  Concentration: Fair and Attention Span: Fair  Recall:  [employer] of Knowledge:  Fair  Language:  Fair  Akathisia:  No  Handed:  Right  AIMS (if indicated):     Assets:  Desire for Improvement Housing Physical Health Resilience Social Support  ADL's:  Impaired  Cognition:  Impaired,  Mild  Sleep:  Number of Hours: 8.15     Treatment Plan Summary: Plan Patient remains very impaired.  She continues to be social withdrawn, but has been complinat with treatment.  she seems to tolerate the 1st ECT and is willing to continue. 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   Dispo: -- Defer to primary team.     Myiah Petkus, MD 01/13/2018, 12:01 PM

## 2018-01-13 NOTE — Progress Notes (Signed)
D- Patient alert and oriented. Patient presents in a sad/depressed mood on assessment, and when asked by this writer if she wanted to visit her family today, patient stated to this writer "no, not this time". Patient endorses depression stating "I don't feel good", and when this writer asked patient what does she usually do when she feels this way she states "I just rest". Patient denies SI, HI, AVH, and pain at this time. Patient had no stated goals for today.  A- Scheduled medications administered to patient, per MD orders. Support and encouragement provided.  Routine safety checks conducted every 15 minutes.  Patient informed to notify staff with problems or concerns.  R- No adverse drug reactions noted. Patient contracts for safety at this time. Patient compliant with medications and treatment plan. Patient receptive, calm, and cooperative. Patient interacts well with others on the unit. Patient remains safe at this time.

## 2018-01-13 NOTE — B[facility] Group Notes (Signed)
LCSW Group Therapy Note  01/13/2018 1:15pm  Type of Therapy and Topic:  Group Therapy:  Cognitive Distortions  Participation Level:  Did Not Attend   Description of Group:    Patients in this group will be introduced to the topic of cognitive distortions.  Patients will identify and describe cognitive distortions, describe the feelings these distortions create for them.  Patients will identify one or more situations in their personal life where they have cognitively distorted thinking and will verbalize challenging this cognitive distortion through positive thinking skills.  Patients will practice the skill of using positive affirmations to challenge cognitive distortions using affirmation cards.    Therapeutic Goals:  1. Patient will identify two or more cognitive distortions they have used 2. Patient will identify one or more emotions that stem from use of a cognitive distortion 3. Patient will demonstrate use of a positive affirmation to counter a cognitive distortion through discussion and/or role play. 4. Patient will describe one way cognitive distortions can be detrimental to wellness   Summary of Patient Progress: Pt was invited to attend group but chose not to attend. CSW will continue to encourage pt to attend group throughout their admission.      Therapeutic Modalities:   Cognitive Behavioral Therapy Motivational Interviewing   Odean Fester  CUEBAS-COLON, LCSW 01/13/2018 12:49 PM

## 2018-01-13 NOTE — Plan of Care (Signed)
Patient able to understand information received . Emotional and mental status  unchanged . Patient has not progressed. Remains in room  not attending unit programing . Unable to verbalized  feeling.  Problem: Education: Goal: Knowledge of [place] [facility] Education information/materials will improve Outcome: Progressing   Problem: Education: Goal: Emotional status will improve Outcome: Not Progressing Goal: Mental status will improve Outcome: Not Progressing Goal: Verbalization of understanding the information provided will improve Outcome: Not Progressing   Problem: Coping: Goal: Will verbalize feelings Outcome: Not Progressing

## 2018-01-13 NOTE — Plan of Care (Signed)
  Problem: Coping: Goal: Coping ability will improve Outcome: Progressing  Patient is coping appropriately.

## 2018-01-14 DIAGNOSIS — F333 Major depressive disorder, recurrent, severe with psychotic symptoms: Secondary | ICD-10-CM

## 2018-01-14 NOTE — Progress Notes (Addendum)
1:1 with Patient and [facility] #[id number] Isabella [name]: Patient stated slept good last night .Stated appetite is poor and energy level poor. Stated concentration poor. Stated on Depression scale 0 , hopeless 0 and anxiety 0 .( low 0-10 high) Denies suicidal  homicidal ideations  .  No auditory hallucinations  No pain concerns . Appropriate ADL'S. Interacting with peers and staff. Patient able to understand information received . Emotional and mental status  unchanged . Patient has not progressed. Remains in room  not attending unit programing . Unable to verbalized  feeling.  Patient voice  With interpretor pain at the top of  Her abdomen . Stated she has not told any one about this . Voice of this being a aching pain  Intermittent 8 on pain scale    " I dont  Feel good " .A: Encourage patient participation with unit programming . Instruction  Given on  Medication , verbalize understanding. R: Voice no other concerns. Staff continue to monitor

## 2018-01-14 NOTE — Progress Notes (Signed)
D: Patient denies SI/HI/AVH, affect is flat and sad, isolates to self minimal interaction with staff, she appears less anxious and her thoughts are organized and coherent, speech is soft non tangential. Patient rated depression a 5/10  On a scale of ( 0- 10)  A: Patient was offered support and encouragement, and given  scheduled medications and encouraged to attend groups. 15 minute checks were done for safety.  R:Patient did not attend evening group. Pt is complaint with medication. Safety maintained on unit will continue to monitor.

## 2018-01-14 NOTE — Plan of Care (Signed)
  Problem: Coping: Goal: Coping ability will improve 01/14/2018 0353 by Harl Bowie, RN Outcome: Progressing 01/14/2018 0353 by Harl Bowie, RN Outcome: Progressing

## 2018-01-14 NOTE — Plan of Care (Signed)
  Problem: Coping: Goal: Coping ability will improve Outcome: Progressing   

## 2018-01-14 NOTE — Progress Notes (Signed)
[facility] MD Progress Note  01/14/2018 1:06 PM Isabella [name]  MRN:  [id number] Subjective:  Pt has a Dx of schizophrenia, but presents with severe depression with social withdrawn, and poor po intake. Isabella [name] just received her 1st ECT last Friday.    Isabella [name] is seen in her room, Isabella [name] seems to spend most of her day staying in bed. When encouraged her to get up and go to Group or activities room, Isabella [name] is verbally agreeable, but doesn't actually do it.  Denied SI or AVH.    PO intake still poor, but pt does drink Ensure.    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 Depression [F32.9] 01/10/2018  . Dehydration [E86.0] 01/10/2018  . Anorexia [R63.0] 01/10/2018  . Schizophrenia ([place]) [F20.9] 12/31/2017  . Social anxiety disorder [F40.10] 08/02/2016  . 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  Isabella [name] has had several hospitalizations just in the past year or so.  Diagnosis had been made of schizophrenia although it seems to me that her presentation is more like profound psychotic depression.  Patient had not shown consistent response to medication  Past Medical History:  Past Medical History:  Diagnosis Date  . Bipolar disorder ([place])   . Hypertension   . Migraine 07/13/2016  . Schizophrenia ([place])    History reviewed. No pertinent surgical history. Family History:  Family History  Problem Relation Age of Onset  . Mental illness Neg Hx    Family Psychiatric  History: None known Social History:  Social History   Substance and Sexual Activity  Alcohol Use No     Social History   Substance and Sexual Activity  Drug Use No    Social History   Socioeconomic History  . Marital status: Single    Spouse name: Not on file  . Number of children: Not on file  . Years of  education: Not on file  . Highest education level: Not on file  Occupational History  . Not on file  Social Needs  . Financial resource strain: Not on file  . Food insecurity:    Worry: Not on file    Inability: Not on file  . Transportation needs:    Medical: Not on file    Non-medical: Not on file  Tobacco Use  . Smoking status: Smoker, Current Status Unknown  . Smokeless tobacco: Never Used  Substance and Sexual Activity  . Alcohol use: No  . Drug use: No  . Sexual activity: Never  Lifestyle  . Physical activity:    Days per week: Not on file    Minutes per session: Not on file  . Stress: Not on file  Relationships  . Social connections:    Talks on phone: Not on file    Gets together: Not on file    Attends religious service: Not on file    Active member of club or organization: Not on file    Attends meetings of clubs or organizations: Not on file    Relationship status: Not on file  Other Topics Concern  . Not on file  Social History Narrative  . Not on file   Additional Social History:   Sleep: Fair  Appetite:  Poor  Current Medications: Current Facility-Administered Medications  Medication Dose Route Frequency Provider Last Rate Last  Dose  . acetaminophen (TYLENOL) tablet 650 mg  650 mg Oral Q6H PRN Clapacs, John T, MD      . alum & mag hydroxide-simeth (MAALOX/MYLANTA) 200-200-20 MG/5ML suspension 30 mL  30 mL Oral Q4H PRN Clapacs, John T, MD      . feeding supplement (ENSURE ENLIVE) (ENSURE ENLIVE) liquid 237 mL  237 mL Oral TID BM Clapacs, John T, MD   237 mL at 01/14/18 1027  . FLUoxetine (PROZAC) capsule 20 mg  20 mg Oral Daily Clapacs, John T, MD   20 mg at 01/14/18 0757  . hydrOXYzine (ATARAX/VISTARIL) tablet 50 mg  50 mg Oral Q6H PRN Clapacs, Madie Reno, MD      . hyoscyamine (ANASPAZ) disintergrating tablet 0.125 mg  0.125 mg Oral Q6H PRN Clapacs, John T, MD      . magnesium hydroxide (MILK OF MAGNESIA) suspension 30 mL  30 mL Oral Daily PRN Clapacs,  John T, MD      . OLANZapine zydis (ZYPREXA) disintegrating tablet 10 mg  10 mg Oral QHS Clapacs, Madie Reno, MD   10 mg at 01/13/18 2203  . traZODone (DESYREL) tablet 100 mg  100 mg Oral QHS PRN Clapacs, Madie Reno, MD   100 mg at 01/13/18 2202    Lab Results:  Results for orders placed or performed during the hospital encounter of 01/10/18 (from the past 48 hour(s))  Glucose, capillary     Status: None   Collection Time: 01/13/18  7:05 AM  Result Value Ref Range   Glucose-Capillary 85 70 - 99 mg/dL   Comment 1 Notify RN     Blood Alcohol level:  Lab Results  Component Value Date   ETH <10 12/29/2017   ETH <5 81/85/6314    Metabolic Disorder Labs: Lab Results  Component Value Date   HGBA1C 5.4 01/31/2016   MPG 108 01/31/2016   Lab Results  Component Value Date   PROLACTIN 124.2 (H) 01/31/2016   Lab Results  Component Value Date   CHOL 152 01/31/2016   TRIG 147 01/31/2016   HDL 39 (L) 01/31/2016   CHOLHDL 3.9 01/31/2016   VLDL 29 01/31/2016   LDLCALC 84 01/31/2016    Physical Findings: AIMS:  , ,  ,  ,    CIWA:    COWS:     Musculoskeletal: Strength & Muscle Tone: decreased Gait & Station: normal Patient leans: N/A  Psychiatric Specialty Exam: Physical Exam  Nursing note and vitals reviewed. Constitutional: Isabella [name] appears well-developed and well-nourished.  HENT:  Head: Normocephalic and atraumatic.  Eyes: Pupils are equal, round, and reactive to light. Conjunctivae are normal.  Neck: Normal range of motion.  Cardiovascular: Regular rhythm and normal heart sounds.  Respiratory: Effort normal. No respiratory distress.  GI: Soft.  Musculoskeletal: Normal range of motion.  Neurological: Isabella [name] is alert.  Skin: Skin is warm and dry.  Psychiatric: Her affect is blunt. Her speech is delayed. Isabella [name] is slowed and withdrawn. Cognition and memory are impaired.    Review of Systems  Unable to perform ROS: Psychiatric disorder    Blood pressure 122/72, pulse 72, temperature  98.8 F (37.1 C), temperature source Oral, resp. rate 16, height 5\' 3"  (1.6 m), weight 82.6 kg, SpO2 97 %.Body mass index is 32.24 kg/m.  General Appearance: Casual  Eye Contact:  Poor  Speech:  Slow  Volume:  Decreased  Mood:  Dysphoric  Affect:  Blunt and Constricted  Thought Process:  Linear  Orientation:  Other:  to self  and place  Thought Content:  Negative  Suicidal Thoughts:  No  Homicidal Thoughts:  No  Memory:  Immediate;   Poor Recent;   Poor Remote;   Poor  Judgement:  Fair  Insight:  Lacking  Psychomotor Activity:  Decreased  Concentration:  Concentration: Fair and Attention Span: Fair  Recall:  [employer] of Knowledge:  Fair  Language:  Fair  Akathisia:  No  Handed:  Right  AIMS (if indicated):     Assets:  Communication Skills Desire for Improvement Housing Physical Health Resilience Social Support  ADL's:  Impaired  Cognition:  Impaired,  Mild  Sleep:  Number of Hours: 8.15     Treatment Plan Summary: Plan Patient remains very impaired.  Isabella [name] continues to be social withdrawn, but has been complinat with treatment.  Isabella [name] seems to tolerate the 1st ECT and is willing to continue. 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   Dispo: -- Defer to primary team.     Evi Mccomb, MD 01/14/2018, 1:06 PM

## 2018-01-14 NOTE — BHH Group Notes (Signed)
LCSW Group Therapy Note 01/14/2018 1:15pm  Type of Therapy and Topic: Group Therapy: Feelings Around Returning Home & Establishing a Supportive Framework and Supporting Oneself When Supports Not Available  Participation Level: Did Not Attend  Description of Group:  Patients first processed thoughts and feelings about upcoming discharge. These included fears of upcoming changes, lack of change, new living environments, judgements and expectations from others and overall stigma of mental health issues. The group then discussed the definition of a supportive framework, what that looks and feels like, and how do to discern it from an unhealthy non-supportive network. The group identified different types of supports as well as what to do when your family/friends are less than helpful or unavailable  Therapeutic Goals  1. Patient will identify one healthy supportive network that they can use at discharge. 2. Patient will identify one factor of a supportive framework and how to tell it from an unhealthy network. 3. Patient able to identify one coping skill to use when they do not have positive supports from others. 4. Patient will demonstrate ability to communicate their needs through discussion and/or role plays.  Summary of Patient Progress:  Pt was invited to attend group but chose not to attend. CSW will continue to encourage pt to attend group throughout their admission.    Therapeutic Modalities Cognitive Behavioral Therapy Motivational Interviewing   [place], LCSW 01/14/2018 2:39 PM

## 2018-01-15 NOTE — Plan of Care (Signed)
  Problem: Coping: Goal: Coping ability will improve Outcome: Progressing  Patient is not coping with her plan of care .

## 2018-01-15 NOTE — Plan of Care (Signed)
Emotional and mental status  unchanged . Patient has not progressed. Remains in room  not attending unit programing . Unable to verbalized  feeling.Patient able to understand information received .   Problem: Education: Goal: Knowledge of [place] [facility] Education information/materials will improve Outcome: Progressing   Problem: Education: Goal: Emotional status will improve Outcome: Not Progressing Goal: Mental status will improve Outcome: Not Progressing Goal: Verbalization of understanding the information provided will improve Outcome: Not Progressing

## 2018-01-15 NOTE — Progress Notes (Signed)
D: Patient denies SI/HI/AVH, affect is flat and sad, patient isolates to self, no interaction with staff or peers she appears less anxious and her thoughts are organized and coherent, speech is soft non tangential, will nnot come out of the room for group, would not take phone calls from family members. Patient rated depression a 6/10  nn a scale of ( 0- 10) Patient was offered snacks and ensure drinks and both was accepted.  A: Patient was offered support and encouragement, and given  scheduled medications and encouraged to attend groups. 15 minute checks were done for safety.  R:Patient did not attend evening group. Pt is complaint with medication. Safety maintained on unit will continue to monitor.

## 2018-01-15 NOTE — Progress Notes (Signed)
Interpreter   [phone]   D: Patient stated slept good last night .Appetite poor and energy level  Is poor. Stated concentration better . Stated on Depression scale 8.( low 0-10 high) Denies suicidal  homicidal ideations  .  No auditory hallucinations  No pain concerns . Appropriate ADL'S. Limited  Interacting with peers and staff. Patient able to understand information received . Emotional and mental status unchanged . Patient has not progressed. Remains in room not attending unit programing Patient  verbalized feeling  With words of "little bit" Remains isolated to room .  A: Encourage patient participation with unit programming . Instruction  Given on  Medication , verbalize understanding. R: Voice no other concerns. Staff continue to [facility]

## 2018-01-15 NOTE — Progress Notes (Signed)
[facility] MD Progress Note  01/15/2018 5:53 PM Isabella [name]  MRN:  [id number] Subjective: Follow-up for this patient with depression possible schizophrenia.  Patient tells me "I am doing better".  Nevertheless, she is still extremely withdrawn and does not speak much more than that.  Still not getting up out of bed except very occasionally and not eating nearly enough.  She does speak slightly more than she had previously but not a great deal more.  Certainly not enough of the change to be anywhere near ready for discharge.  No new complaints however.  Denies suicidal ideation.  Blood pressure remains on the low side.  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 Depression [F32.9] 01/10/2018  . Dehydration [E86.0] 01/10/2018  . Anorexia [R63.0] 01/10/2018  . Schizophrenia ([place]) [F20.9] 12/31/2017  . Social anxiety disorder [F40.10] 08/02/2016  . 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 Bipolar disorder ([place])   . Hypertension   . Migraine 07/13/2016  . Schizophrenia ([place])    History reviewed. No pertinent surgical history. Family History:  Family History  Problem Relation Age of Onset  . Mental illness Neg Hx    Family Psychiatric  History: None known Social History:  Social History   Substance and Sexual Activity  Alcohol Use No     Social History   Substance and Sexual Activity  Drug Use No    Social History   Socioeconomic History  . Marital status: Single    Spouse name: Not on file  . Number of children: Not on file  . Years of education: Not on file  . Highest education  level: Not on file  Occupational History  . Not on file  Social Needs  . Financial resource strain: Not on file  . Food insecurity:    Worry: Not on file    Inability: Not on file  . Transportation needs:    Medical: Not on file    Non-medical: Not on file  Tobacco Use  . Smoking status: Smoker, Current Status Unknown  . Smokeless tobacco: Never Used  Substance and Sexual Activity  . Alcohol use: No  . Drug use: No  . Sexual activity: Never  Lifestyle  . Physical activity:    Days per week: Not on file    Minutes per session: Not on file  . Stress: Not on file  Relationships  . Social connections:    Talks on phone: Not on file    Gets together: Not on file    Attends religious service: Not on file    Active member of club or organization: Not on file    Attends meetings of clubs or organizations: Not on file    Relationship status: Not on file  Other Topics Concern  . Not on file  Social History Narrative  . Not on file   Additional Social History:                         Sleep: Fair  Appetite:  Poor  Current Medications: Current Facility-Administered Medications  Medication Dose Route Frequency Provider  Last Rate Last Dose  . acetaminophen (TYLENOL) tablet 650 mg  650 mg Oral Q6H PRN Clapacs, John T, MD      . alum & mag hydroxide-simeth (MAALOX/MYLANTA) 200-200-20 MG/5ML suspension 30 mL  30 mL Oral Q4H PRN Clapacs, John T, MD      . feeding supplement (ENSURE ENLIVE) (ENSURE ENLIVE) liquid 237 mL  237 mL Oral TID BM Clapacs, John T, MD   237 mL at 01/15/18 1456  . FLUoxetine (PROZAC) capsule 20 mg  20 mg Oral Daily Clapacs, John T, MD   20 mg at 01/15/18 1517  . hydrOXYzine (ATARAX/VISTARIL) tablet 50 mg  50 mg Oral Q6H PRN Clapacs, Madie Reno, MD      . hyoscyamine (ANASPAZ) disintergrating tablet 0.125 mg  0.125 mg Oral Q6H PRN Clapacs, John T, MD      . magnesium hydroxide (MILK OF MAGNESIA) suspension 30 mL  30 mL Oral Daily PRN Clapacs, John T, MD       . OLANZapine zydis (ZYPREXA) disintegrating tablet 10 mg  10 mg Oral QHS Clapacs, Madie Reno, MD   10 mg at 01/14/18 2152  . traZODone (DESYREL) tablet 100 mg  100 mg Oral QHS PRN Clapacs, Madie Reno, MD   100 mg at 01/14/18 2152    Lab Results: No results found for this or any previous visit (from the past 9 hour(s)).  Blood Alcohol level:  Lab Results  Component Value Date   ETH <10 12/29/2017   ETH <5 61/60/7371    Metabolic Disorder Labs: Lab Results  Component Value Date   HGBA1C 5.4 01/31/2016   MPG 108 01/31/2016   Lab Results  Component Value Date   PROLACTIN 124.2 (H) 01/31/2016   Lab Results  Component Value Date   CHOL 152 01/31/2016   TRIG 147 01/31/2016   HDL 39 (L) 01/31/2016   CHOLHDL 3.9 01/31/2016   VLDL 29 01/31/2016   LDLCALC 84 01/31/2016    Physical Findings: AIMS:  , ,  ,  ,    CIWA:    COWS:     Musculoskeletal: Strength & Muscle Tone: decreased Gait & Station: unsteady Patient leans: N/A  Psychiatric Specialty Exam: Physical Exam  Nursing note and vitals reviewed. Constitutional: She appears well-developed and well-nourished.  HENT:  Head: Normocephalic and atraumatic.  Eyes: Pupils are equal, round, and reactive to light. Conjunctivae are normal.  Neck: Normal range of motion.  Cardiovascular: Regular rhythm and normal heart sounds.  Respiratory: Effort normal. No respiratory distress.  GI: Soft.  Musculoskeletal: Normal range of motion.  Neurological: She is alert.  Skin: Skin is warm and dry.  Psychiatric: Her affect is blunt. Her speech is delayed and tangential. She is slowed and withdrawn. She expresses inappropriate judgment. She expresses no suicidal ideation. She exhibits abnormal recent memory.    Review of Systems  Constitutional: Positive for malaise/fatigue and weight loss.  HENT: Negative.   Eyes: Negative.   Respiratory: Negative.   Cardiovascular: Negative.   Gastrointestinal: Negative.   Musculoskeletal: Negative.    Skin: Negative.   Neurological: Negative.   Psychiatric/Behavioral: Positive for depression.    Blood pressure 99/67, pulse (!) 117, temperature 98.5 F (36.9 C), temperature source Oral, resp. rate 18, height 5\' 3"  (1.6 m), weight 82.6 kg, SpO2 99 %.Body mass index is 32.24 kg/m.  General Appearance: Disheveled  Eye Contact:  Fair  Speech:  Slow  Volume:  Decreased  Mood:  Dysphoric  Affect:  Constricted  Thought Process:  Disorganized  Orientation:  Negative  Thought Content:  Illogical  Suicidal Thoughts:  No  Homicidal Thoughts:  No  Memory:  Immediate;   Fair Recent;   Fair Remote;   Fair  Judgement:  Impaired  Insight:  Shallow  Psychomotor Activity:  Decreased  Concentration:  Concentration: Poor  Recall:  [employer] of Knowledge:  Fair  Language:  Fair  Akathisia:  No  Handed:  Right  AIMS (if indicated):     Assets:  Housing  ADL's:  Impaired  Cognition:  Impaired,  Mild  Sleep:  Number of Hours: 7.5     Treatment Plan Summary: Daily contact with patient to assess and evaluate symptoms and progress in treatment, Medication management and Plan No change to medication management.  Psychoeducation and encouragement to the patient to try and make sure she is still eating and drinking well and taking care of her hygiene.  The holiday delayed ECT treatment which will start up again on Wednesday.  [name], MD 01/15/2018, 5:53 PM

## 2018-01-16 ENCOUNTER — Other Ambulatory Visit: Payer: Self-pay | Admitting: Psychiatry

## 2018-01-16 NOTE — Progress Notes (Signed)
D: Utilized interpreter services for assessment. Pt denies SI/HI/AVH, verbally is able to contract for safety. Pt. Speaking more to this Probation officer and when utilizing interpreter services. Pt. English is good with this Probation officer. Pt. Gets up for breakfast this morning with strong encouragement from this Probation officer and staff. Pt. Reports anxiety and depression today at, "7/10" and "8/10" respectively, but reports she is doing better. Pt. Denies need for PRN medications for anxiety for comfort. Pt. Ate a good portion of breakfast this morning which is good improvement. Pt. Continues to isolate and withdraw to room. Participation is still poor.     A: Q x 15 minute observation checks were completed for safety. Patient was provided with education, but needs reinforcement.  Patient was given/offered medications per orders. Patient  was encourage to attend groups, participate in unit activities and continue with plan of care. Pt. Chart and plans of care reviewed. Pt. Given support and encouragement.   R: Patient is complaint with medication and unit procedures with redirection from staff.             Precautionary checks every 15 minutes for safety maintained, room free of safety hazards, patient sustains no injury or falls during this shift. Will endorse care to next shift.

## 2018-01-16 NOTE — Progress Notes (Signed)
Recreation Therapy Notes  Date: 01/16/2018  Time: 9:30 am   Location: Outside   Behavioral response: N/A   Intervention Topic: Problem Solving  Discussion/Intervention: Patient did not attend group.   Clinical Observations/Feedback:  Patient did not attend group.   Magaret Justo LRT/CTRS        Iria Jamerson 01/16/2018 11:05 AM

## 2018-01-16 NOTE — Plan of Care (Signed)
Utilized interpreter services for assessment. Pt. Speaking more to this Probation officer and when utilizing interpreter services. Pt. English is good with this Probation officer. Pt. Gets up for breakfast this morning with strong encouragement from this Probation officer and staff. Pt. Reports anxiety and depression today at, "7/10" and "8/10" respectively. Pt. Denies need for PRN medications for anxiety for comfort. Pt. Ate a good portion of breakfast this morning which is good improvement. Pt. Denies Si/hi and verbally is able to contract for safety. Pt. Continues to isolate and withdraw to room. Participation is still poor. Pt. Needs education reinforcement.    Problem: Education: Goal: Knowledge of [place] [facility] Education information/materials will improve Outcome: Not Progressing   Problem: Education: Goal: Emotional status will improve Outcome: Progressing Goal: Mental status will improve Outcome: Progressing   Problem: Safety: Goal: Ability to remain free from injury will improve Outcome: Progressing

## 2018-01-16 NOTE — Progress Notes (Signed)
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  Patient says once again today that she is "a little better".  Does not say much else.  Notes indicate that she does get up and eat more although she still does not attend groups.  Denies any acute suicidal intent.  Still very passive and withdrawn.  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 Depression [F32.9] 01/10/2018  . Dehydration [E86.0] 01/10/2018  . Anorexia [R63.0] 01/10/2018  . Schizophrenia ([place]) [F20.9] 12/31/2017  . Social anxiety disorder [F40.10] 08/02/2016  . 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 Bipolar disorder ([place])   . Hypertension   . Migraine 07/13/2016  . Schizophrenia ([place])    History reviewed. No pertinent surgical history. Family History:  Family History  Problem Relation Age of Onset  . Mental illness Neg Hx    Family Psychiatric  History: None known Social History:  Social History   Substance and Sexual Activity  Alcohol Use No     Social History   Substance and Sexual Activity  Drug Use No    Social History   Socioeconomic History  . Marital status: Single    Spouse name: Not on file  . Number of children: Not on file  . Years of education: Not on file  . Highest education level: Not on file  Occupational History  . Not on file  Social Needs  . Financial resource strain: Not on file  . Food insecurity:    Worry: Not on file    Inability: Not on file  . Transportation needs:    Medical: Not on file    Non-medical: Not on file  Tobacco Use  . Smoking status:  Smoker, Current Status Unknown  . Smokeless tobacco: Never Used  Substance and Sexual Activity  . Alcohol use: No  . Drug use: No  . Sexual activity: Never  Lifestyle  . Physical activity:    Days per week: Not on file    Minutes per session: Not on file  . Stress: Not on file  Relationships  . Social connections:    Talks on phone: Not on file    Gets together: Not on file    Attends religious service: Not on file    Active member of club or organization: Not on file    Attends meetings of clubs or organizations: Not on file    Relationship status: Not on file  Other Topics Concern  . Not on file  Social History Narrative  . Not on file   Additional Social History:                         Sleep: Fair  Appetite:  Fair  Current Medications: Current Facility-Administered Medications  Medication Dose Route Frequency Provider Last Rate Last Dose  . acetaminophen (TYLENOL) tablet 650 mg  650 mg Oral Q6H PRN Teleshia Lemere T, MD      . alum & mag hydroxide-simeth (MAALOX/MYLANTA) 200-200-20 MG/5ML suspension 30 mL  30 mL Oral Q4H PRN Ashira Kirsten, Madie Reno, MD      . feeding supplement (ENSURE ENLIVE) (  ENSURE ENLIVE) liquid 237 mL  237 mL Oral TID BM Goldie Tregoning T, MD   237 mL at 01/16/18 1721  . FLUoxetine (PROZAC) capsule 20 mg  20 mg Oral Daily Aimar Shrewsbury T, MD   20 mg at 01/16/18 0800  . hydrOXYzine (ATARAX/VISTARIL) tablet 50 mg  50 mg Oral Q6H PRN Emet Rafanan T, MD      . hyoscyamine (ANASPAZ) disintergrating tablet 0.125 mg  0.125 mg Oral Q6H PRN Lari Linson T, MD      . magnesium hydroxide (MILK OF MAGNESIA) suspension 30 mL  30 mL Oral Daily PRN Lacreasha Hinds T, MD      . OLANZapine zydis (ZYPREXA) disintegrating tablet 10 mg  10 mg Oral QHS Brittney Mucha, Madie Reno, MD   10 mg at 01/15/18 2120  . traZODone (DESYREL) tablet 100 mg  100 mg Oral QHS PRN Annabell Oconnor, Madie Reno, MD   100 mg at 01/14/18 2152    Lab Results: No results found for this or any previous visit (from  the past 31 hour(s)).  Blood Alcohol level:  Lab Results  Component Value Date   ETH <10 12/29/2017   ETH <5 99/83/3825    Metabolic Disorder Labs: Lab Results  Component Value Date   HGBA1C 5.4 01/31/2016   MPG 108 01/31/2016   Lab Results  Component Value Date   PROLACTIN 124.2 (H) 01/31/2016   Lab Results  Component Value Date   CHOL 152 01/31/2016   TRIG 147 01/31/2016   HDL 39 (L) 01/31/2016   CHOLHDL 3.9 01/31/2016   VLDL 29 01/31/2016   LDLCALC 84 01/31/2016    Physical Findings: AIMS:  , ,  ,  ,    CIWA:    COWS:     Musculoskeletal: Strength & Muscle Tone: within normal limits Gait & Station: normal Patient leans: N/A  Psychiatric Specialty Exam: Physical Exam  Nursing note and vitals reviewed. Constitutional: She appears well-developed and well-nourished.  HENT:  Head: Normocephalic and atraumatic.  Eyes: Pupils are equal, round, and reactive to light. Conjunctivae are normal.  Neck: Normal range of motion.  Cardiovascular: Regular rhythm and normal heart sounds.  Respiratory: Effort normal.  GI: Soft.  Musculoskeletal: Normal range of motion.  Neurological: She is alert.  Skin: Skin is warm and dry.  Psychiatric: Her affect is blunt. Her speech is delayed. She is slowed. Cognition and memory are impaired. She expresses no suicidal ideation.    Review of Systems  Constitutional: Negative.   HENT: Negative.   Eyes: Negative.   Respiratory: Negative.   Cardiovascular: Negative.   Gastrointestinal: Negative.   Musculoskeletal: Negative.   Skin: Negative.   Neurological: Negative.   Psychiatric/Behavioral: Negative for suicidal ideas.    Blood pressure (!) 94/57, pulse 73, temperature 98.6 F (37 C), resp. rate 20, height 5\' 3"  (1.6 m), weight 82.6 kg, SpO2 100 %.Body mass index is 32.24 kg/m.  General Appearance: Casual  Eye Contact:  Minimal  Speech:  Slow  Volume:  Decreased  Mood:  Euthymic  Affect:  Constricted  Thought Process:   Coherent  Orientation:  Full (Time, Place, and Person)  Thought Content:  Illogical  Suicidal Thoughts:  No  Homicidal Thoughts:  No  Memory:  Immediate;   Fair Recent;   Poor Remote;   Fair  Judgement:  Impaired  Insight:  Shallow  Psychomotor Activity:  Decreased  Concentration:  Concentration: Fair  Recall:  [employer] of Knowledge:  Fair  Language:  Fair  Akathisia:  No  Handed:  Right  AIMS (if indicated):     Assets:  Housing Physical Health  ADL's:  Impaired  Cognition:  Impaired,  Mild  Sleep:  Number of Hours: 8     Treatment Plan Summary: Daily contact with patient to assess and evaluate symptoms and progress in treatment, Medication management and Plan No change to medication.  ECT treatment scheduled for tomorrow.  I continue to have a lot of optimism about ECT in this patient and she is agreeable to the plan.  Psychoeducation and encouragement completed.  [name], MD 01/16/2018, 7:24 PM

## 2018-01-16 NOTE — [facility] Group Notes (Signed)
01/16/2018 1PM  Type of Therapy/Topic:  Group Therapy:  Feelings about Diagnosis  Participation Level:  Did Not Attend   Description of Group:   This group will allow patients to explore their thoughts and feelings about diagnoses they have received. Patients will be guided to explore their level of understanding and acceptance of these diagnoses. Facilitator will encourage patients to process their thoughts and feelings about the reactions of others to their diagnosis and will guide patients in identifying ways to discuss their diagnosis with significant others in their lives. This group will be process-oriented, with patients participating in exploration of their own experiences, giving and receiving support, and processing challenge from other group members.   Therapeutic Goals: 1. Patient will demonstrate understanding of diagnosis as evidenced by identifying two or more symptoms of the disorder 2. Patient will be able to express two feelings regarding the diagnosis 3. Patient will demonstrate their ability to communicate their needs through discussion and/or role play  Summary of Patient Progress: Patient was encouraged and invited to attend group. Patient did not attend group. Social worker will continue to encourage group participation in the future.        Therapeutic Modalities:   Cognitive Behavioral Therapy Brief Therapy Feelings Identification    Darin Engels, [place] 01/16/2018 1:55 PM

## 2018-01-16 NOTE — [facility] Group Notes (Signed)
[place] Group Notes:  (Nursing/MHT/Case Management/Adjunct)  Date:  01/16/2018  Time:  9:27 PM  Type of Therapy:  Group Therapy  Participation Level:  Did Not Attend     Barnie Mort 01/16/2018, 9:27 PM

## 2018-01-16 NOTE — Plan of Care (Signed)
Patient continue to isolate self in her room, calm and maintaining safety of self , and others ,compliant with her medications, out for meals only not participating in any social activities or groups, lacking coping skills. Mood is flat and guarded, poor interreactions,  support and encouragement is provided to increase self esteam, denies any SI/HI and no signs of AVH, 15 minute safety rounds is in progress. No distress   Problem: Education: Goal: Knowledge of [place] [facility] Education information/materials will improve Outcome: Progressing Goal: Emotional status will improve Outcome: Progressing Goal: Mental status will improve Outcome: Progressing Goal: Verbalization of understanding the information provided will improve Outcome: Progressing   Problem: Coping: Goal: Coping ability will improve Outcome: Progressing Goal: Will verbalize feelings Outcome: Progressing   Problem: Safety: Goal: Ability to remain free from injury will improve Outcome: Progressing

## 2018-01-17 ENCOUNTER — Inpatient Hospital Stay: Payer: Medicaid Other

## 2018-01-17 LAB — GLUCOSE, CAPILLARY: Glucose-Capillary: 91 mg/dL (ref 70–99)

## 2018-01-17 MED ORDER — METHOHEXITAL SODIUM 100 MG/10ML IV SOSY
PREFILLED_SYRINGE | INTRAVENOUS | Status: DC | PRN
  Administered 2018-01-17: 80 mg via INTRAVENOUS

## 2018-01-17 MED ORDER — GLYCOPYRROLATE 0.2 MG/ML IJ SOLN
INTRAMUSCULAR | Status: AC
  Filled 2018-01-17: qty 1

## 2018-01-17 MED ORDER — ESMOLOL HCL 100 MG/10ML IV SOLN
INTRAVENOUS | Status: DC | PRN
  Administered 2018-01-17: 10 mg via INTRAVENOUS

## 2018-01-17 MED ORDER — SUCCINYLCHOLINE CHLORIDE 200 MG/10ML IV SOSY
PREFILLED_SYRINGE | INTRAVENOUS | Status: DC | PRN
  Administered 2018-01-17: 100 mg via INTRAVENOUS

## 2018-01-17 MED ORDER — SUCCINYLCHOLINE CHLORIDE 20 MG/ML IJ SOLN
INTRAMUSCULAR | Status: AC
  Filled 2018-01-17: qty 1

## 2018-01-17 MED ORDER — GLYCOPYRROLATE 0.2 MG/ML IJ SOLN
0.1000 mg | Freq: Once | INTRAMUSCULAR | Status: AC
  Administered 2018-01-17: 0.1 mg via INTRAVENOUS

## 2018-01-17 MED ORDER — SODIUM CHLORIDE 0.9 % IV SOLN
500.0000 mL | Freq: Once | INTRAVENOUS | Status: AC
  Administered 2018-01-17: 11:00:00 via INTRAVENOUS

## 2018-01-17 NOTE — Anesthesia Preprocedure Evaluation (Addendum)
Anesthesia Evaluation  Patient identified by MRN, date of birth, ID band Patient awake    Reviewed: Allergy & Precautions, H&P , NPO status , Patient's Chart, lab work & pertinent test results  Airway Mallampati: III  TM Distance: >3 FB Neck ROM: full    Dental  (+) Chipped   Pulmonary neg pulmonary ROS,           Cardiovascular hypertension, negative cardio ROS       Neuro/Psych  Headaches, PSYCHIATRIC DISORDERS Anxiety Depression Bipolar Disorder Schizophrenia negative neurological ROS  negative psych ROS   GI/Hepatic negative GI ROS, Neg liver ROS,   Endo/Other  negative endocrine ROS  Renal/GU negative Renal ROS  negative genitourinary   Musculoskeletal   Abdominal   Peds  Hematology negative hematology ROS (+)   Anesthesia Other Findings Past Medical History: No date: Bipolar disorder ([place]) No date: Hypertension 07/13/2016: Migraine No date: Schizophrenia [facility])  History reviewed. No pertinent surgical history.  BMI    Body Mass Index:  32.24 kg/m      Reproductive/Obstetrics negative OB ROS                             Anesthesia Physical Anesthesia Plan  ASA: II  Anesthesia Plan: General   Post-op Pain Management:    Induction:   PONV Risk Score and Plan: TIVA  Airway Management Planned: Mask  Additional Equipment:   Intra-op Plan:   Post-operative Plan:   Informed Consent: I have reviewed the patients History and Physical, chart, labs and discussed the procedure including the risks, benefits and alternatives for the proposed anesthesia with the patient or authorized representative who has indicated his/her understanding and acceptance.   Dental Advisory Given  Plan Discussed with: Anesthesiologist, CRNA and Surgeon  Anesthesia Plan Comments:        Anesthesia Quick Evaluation

## 2018-01-17 NOTE — Anesthesia Post-op Follow-up Note (Signed)
Anesthesia QCDR form completed.        

## 2018-01-17 NOTE — Procedures (Signed)
ECT SERVICES Physician's Interval Evaluation & Treatment Note  Patient Identification: Isabella [name] MRN:  [id number] Date of Evaluation:  01/17/2018 TX #: 2  MADRS:   MMSE:   P.E. Findings:  No change to physical exam  Psychiatric Interval Note:  Slightly more verbal  Subjective:  Patient is a 42 y.o. female seen for evaluation for Electroconvulsive Therapy. No specific complaint  Treatment Summary:   []   Right Unilateral             [x]  Bilateral   % Energy : 1.0 ms 30%   Impedance: 2030 ohms  Seizure Energy Index: 27,944 V squared  Postictal Suppression Index: 90%  Seizure Concordance Index: 98%  Medications  Pre Shock: Brevital 80 mg succinylcholine 100 mg  Post Shock:    Seizure Duration: 33 seconds by EMG 33 seconds by EEG   Comments: Continue inpatient treatment next treatment Friday  Lungs:  [x]   Clear to auscultation               []  Other:   Heart:    [x]   Regular rhythm             []  irregular rhythm    [x]   Previous H&P reviewed, patient examined and there are NO CHANGES                 []   Previous H&P reviewed, patient examined and there are changes noted.   [name], MD 9/4/201911:19 AM

## 2018-01-17 NOTE — Progress Notes (Signed)
Recreation Therapy Notes  Date: 01/17/2018  Time: 9:30 am   Location: Craft room   Behavioral response: N/A   Intervention Topic: Team Work  Discussion/Intervention: Patient did not attend group.   Clinical Observations/Feedback:  Patient did not attend group.   Brentt Fread LRT/CTRS        Ader Fritze 01/17/2018 12:09 PM

## 2018-01-17 NOTE — Transfer of Care (Signed)
Immediate Anesthesia Transfer of Care Note  Patient: Isabella [name]  Procedure(s) Performed: ECT TX  Patient Location: PACU  Anesthesia Type:General  Level of Consciousness: sedated  Airway & Oxygen Therapy: Patient Spontanous Breathing and Patient connected to face mask oxygen  Post-op Assessment: Report given to RN and Post -op Vital signs reviewed and stable  Post vital signs: Reviewed and stable  Last Vitals:  Vitals Value Taken Time  BP    Temp    Pulse    Resp    SpO2      Last Pain:  Vitals:   01/17/18 0955  TempSrc:   PainSc: 0-No pain         Complications: No apparent anesthesia complications

## 2018-01-17 NOTE — [facility] Group Notes (Signed)
LCSW Group Therapy Note  01/17/2018 1:00 pm  Type of Therapy/Topic:  Group Therapy:  Emotion Regulation  Participation Level:  Did Not Attend   Description of Group:    The purpose of this group is to assist patients in learning to regulate negative emotions and experience positive emotions. Patients will be guided to discuss ways in which they have been vulnerable to their negative emotions. These vulnerabilities will be juxtaposed with experiences of positive emotions or situations, and patients will be challenged to use positive emotions to combat negative ones. Special emphasis will be placed on coping with negative emotions in conflict situations, and patients will process healthy conflict resolution skills.  Therapeutic Goals: 1. Patient will identify two positive emotions or experiences to reflect on in order to balance out negative emotions 2. Patient will label two or more emotions that they find the most difficult to experience 3. Patient will demonstrate positive conflict resolution skills through discussion and/or role plays  Summary of Patient Progress:   Isabella [name] was invited to today's group, but chose not to attend.    Therapeutic Modalities:   Cognitive Behavioral Therapy Feelings Identification Dialectical Behavioral Therapy

## 2018-01-17 NOTE — H&P (Signed)
Isabella [name] is an 42 y.o. female.   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 Bipolar disorder ([place])   . Hypertension   . Migraine 07/13/2016  . Schizophrenia ([place])     History reviewed. No pertinent surgical history.  Family History  Problem Relation Age of Onset  . Mental illness Neg Hx    Social History:  reports that she has been smoking. She has never used smokeless tobacco. She reports that she does not drink alcohol or use drugs.  Allergies: No Known Allergies  Medications Prior to Admission  Medication Sig Dispense Refill  . acetaminophen (TYLENOL) 325 MG tablet Take 2 tablets (650 mg total) by mouth every 6 (six) hours as needed for mild pain.    Marland Kitchen alum & mag hydroxide-simeth (MAALOX/MYLANTA) 200-200-20 MG/5ML suspension Take 30 mLs by mouth every 4 (four) hours as needed for indigestion. 355 mL 0  . ARIPiprazole (ABILIFY) 20 MG tablet Take 1 tablet (20 mg total) by mouth daily. For mood control    . escitalopram (LEXAPRO) 5 MG tablet Take 3 tablets (15 mg total) by mouth daily. For depression    . feeding supplement, ENSURE ENLIVE, (ENSURE ENLIVE) LIQD Take 237 mLs by mouth 2 (two) times daily between meals. For nutritional supplementation 237 mL 12  . hydrOXYzine (ATARAX/VISTARIL) 50 MG tablet Take 1 tablet (50 mg total) by mouth every 6 (six) hours as needed for anxiety. 1 tablet 0  . hyoscyamine (ANASPAZ) 0.125 MG TBDP disintergrating tablet Take 1 tablet (0.125 mg total) by mouth every 4 (four) hours as needed for cramping.  0  . magnesium hydroxide (MILK OF MAGNESIA) 400 MG/5ML suspension Take 30 mLs by mouth daily as needed for mild constipation. 360 mL 0  . OLANZapine zydis (ZYPREXA) 5 MG disintegrating tablet Take 1 tablet (5 mg total) by mouth every 8 (eight) hours as  needed (agitation/psychosis).    . traZODone (DESYREL) 100 MG tablet Take 1 tablet (100 mg total) by mouth at bedtime as needed for sleep.      Results for orders placed or performed during the hospital encounter of 01/10/18 (from the past 48 hour(s))  Glucose, capillary     Status: None   Collection Time: 01/17/18  6:45 AM  Result Value Ref Range   Glucose-Capillary 91 70 - 99 mg/dL   Comment 1 Notify RN    No results found.  Review of Systems  Constitutional: Negative.   HENT: Negative.   Eyes: Negative.   Respiratory: Negative.   Cardiovascular: Negative.   Gastrointestinal: Negative.   Musculoskeletal: Negative.   Skin: Negative.   Neurological: Negative.   Psychiatric/Behavioral: Positive for depression and memory loss. Negative for hallucinations, substance abuse and suicidal ideas. The patient is not nervous/anxious and does not have insomnia.     Blood pressure 105/69, pulse 85, temperature 98.9 F (37.2 C), temperature source Oral, resp. rate 18, height 5\' 3"  (1.6 m), weight 82.6 kg, SpO2 98 %. Physical Exam  Nursing note and vitals reviewed. Constitutional: She appears well-developed and well-nourished.  HENT:  Head: Normocephalic and atraumatic.  Eyes: Pupils are equal, round, and reactive to light. Conjunctivae are normal.  Neck: Normal range of motion.  Cardiovascular: Regular rhythm and normal heart sounds.  Respiratory: Effort normal. No respiratory distress.  GI: Soft.  Musculoskeletal: Normal range of  motion.  Neurological: She is alert.  Skin: Skin is warm and dry.  Psychiatric: Judgment normal. Her affect is blunt. Her speech is delayed. She is slowed and withdrawn. Thought content is not paranoid. Cognition and memory are impaired. She expresses no suicidal ideation.     Assessment/Plan Continue ECT and inpatient treatment with medication therapy and monitoring.  This is treatment #2 we will continue as long as it takes with inpatient treatment for  now.  [name], MD 01/17/2018, 11:17 AM

## 2018-01-17 NOTE — Plan of Care (Signed)
Patient is resting in bed , sleeping long hours with out any issues, compliant with meds , no groups and minimal interactions with peers, safety is maintained 15 minite rounding in progress, denies SI/HI.   Problem: Education: Goal: Knowledge of [facility] General Education information/materials will improve Outcome: Progressing Goal: Emotional status will improve Outcome: Progressing Goal: Mental status will improve Outcome: Progressing Goal: Verbalization of understanding the information provided will improve Outcome: Progressing   Problem: Coping: Goal: Coping ability will improve Outcome: Progressing Goal: Will verbalize feelings Outcome: Progressing   Problem: Safety: Goal: Ability to remain free from injury will improve Outcome: Progressing

## 2018-01-17 NOTE — Progress Notes (Signed)
D- Patient alert and oriented. Patient presents in a depressed, but pleasant mood on assessment stating that she slept "regular" last night and had a complaint of chest pain, stating that she has felt pain in her chest before. Patient is scheduled for ECT this morning, so this writer held her morning medications. Patient rates her depression a "8/10" on her self-inventory, however, she stated to this writer that she had "little" depression.  Patient denies SI, HI, AVH, at this time. Patient also denies any anxiety to this Probation officer. Patient has no stated goals for today, however, she reports on her self-inventory that "instead of laying in bed, we are going to sit up in bed for a period of time".   A- Support and encouragement provided.  Routine safety checks conducted every 15 minutes. Patient informed to notify staff with problems or concerns.  R- Patient contracts for safety at this time. Patient compliant with treatment plan. Patient receptive, calm, and cooperative. Patient interacts well with others on the unit. Patient remains safe at this time.

## 2018-01-17 NOTE — Tx Team (Signed)
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 acetaminophen (TYLENOL) tablet 650 mg  650 mg Oral Q6H PRN Clapacs, John T, MD      . alum & mag hydroxide-simeth (MAALOX/MYLANTA) 200-200-20 MG/5ML suspension 30 mL  30 mL Oral Q4H PRN Clapacs, John T, MD      . feeding supplement (ENSURE ENLIVE) (ENSURE ENLIVE) liquid 237 mL  237 mL Oral TID BM Clapacs, Madie Reno, MD   237 mL at 01/16/18 2103  . FLUoxetine (PROZAC) capsule 20 mg  20 mg Oral Daily Clapacs, Madie Reno, MD   Stopped at 01/17/18 0830  . hydrOXYzine (ATARAX/VISTARIL) tablet 50 mg  50 mg Oral Q6H PRN Clapacs, Madie Reno, MD      . hyoscyamine (ANASPAZ) disintergrating tablet 0.125 mg  0.125 mg Oral Q6H PRN Clapacs, John T, MD      . magnesium hydroxide (MILK OF MAGNESIA) suspension 30 mL  30 mL Oral Daily PRN Clapacs, John T, MD      . OLANZapine zydis (ZYPREXA) disintegrating tablet 10 mg  10 mg Oral QHS Clapacs, Madie Reno, MD   10 mg at 01/16/18 2147  . traZODone (DESYREL) tablet 100 mg  100 mg Oral QHS PRN Clapacs, Madie Reno, MD   100 mg at 01/16/18 2147   Facility-Administered Medications Ordered in Other Encounters  Medication Dose Route Frequency Provider Last Rate Last Dose  . esmolol (BREVIBLOC) injection   Intravenous Anesthesia Intra-op Dionne Bucy, CRNA   10 mg at 01/17/18 1132  . methohexital Sodium   Intravenous Anesthesia Intra-op Dionne Bucy, CRNA   80 mg at 01/17/18 1123  . succinylcholine (ANECTINE) syringe   Intravenous Anesthesia Intra-op Dionne Bucy, CRNA   100 mg at 01/17/18 1124   PTA  Medications: Medications Prior to Admission  Medication Sig Dispense Refill Last Dose  . acetaminophen (TYLENOL) 325 MG tablet Take 2 tablets (650 mg total) by mouth every 6 (six) hours as needed for mild pain.     Marland Kitchen alum & mag hydroxide-simeth (MAALOX/MYLANTA) 200-200-20 MG/5ML suspension Take 30 mLs by mouth every 4 (four) hours as needed for indigestion. 355 mL 0   . ARIPiprazole (ABILIFY) 20 MG tablet Take 1 tablet (20 mg total) by mouth daily. For mood control     . escitalopram (LEXAPRO) 5 MG tablet Take 3 tablets (15 mg total) by mouth daily. For depression     . feeding supplement, ENSURE ENLIVE, (ENSURE ENLIVE) LIQD Take 237 mLs by mouth 2 (two) times daily between meals. For nutritional supplementation 237 mL 12   . hydrOXYzine (ATARAX/VISTARIL) 50 MG tablet Take 1 tablet (50 mg total) by mouth every 6 (six) hours as needed for anxiety. 1 tablet 0   . hyoscyamine (ANASPAZ) 0.125 MG TBDP disintergrating tablet Take 1 tablet (0.125 mg total) by mouth every 4 (four) hours as needed for cramping.  0   . magnesium hydroxide (MILK OF MAGNESIA) 400 MG/5ML suspension Take 30 mLs by mouth daily as needed for mild constipation. 360 mL 0   . OLANZapine zydis (ZYPREXA) 5 MG disintegrating tablet Take 1 tablet (5 mg total) by mouth every 8 (eight) hours as  needed (agitation/psychosis).     . traZODone (DESYREL) 100 MG tablet Take 1 tablet (100 mg total) by mouth at bedtime as needed for sleep.       Patient Stressors: Health problems Medication change or noncompliance  Patient Strengths: Ability for insight Capable of independent living Physical Health Supportive family/friends  Treatment Modalities: Medication Management, Group therapy, Case management,  1 to 1 session with clinician, Psychoeducation, Recreational therapy.   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  Therapeutic Interventions: 1 to 1 sessions, Unit Group sessions and Medication administration.  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  Therapeutic Interventions: 1 to 1 sessions, Unit Group sessions and Medication administration.  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 RN will report any adverse and/or side effects to prescribing provider.  Therapeutic Interventions: 1 on 1 counseling sessions, Psychoeducation, Medication administration, Evaluate responses to treatment, Monitor vital signs and CBGs as ordered, Perform/monitor CIWA, COWS, AIMS and Fall Risk screenings as ordered, Perform wound care treatments as ordered.  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  Short Term Goals: Engage patient in aftercare planning with referrals and resources, Increase social support, Increase ability to appropriately verbalize feelings, Identify triggers associated with mental health/substance abuse issues and Increase skills for wellness and recovery  Therapeutic Interventions: Assess for all discharge needs, 1 to 1 time with Social worker, Explore available resources and support systems, Assess for adequacy in community support network, Educate family and significant other(s) on suicide prevention, Complete Psychosocial Assessment, Interpersonal group therapy.  Evaluation of Outcomes: Progressing   Progress in Treatment: Attending groups: No. Participating in groups: No. Taking medication as prescribed: Yes. Toleration medication: Yes. Family/Significant other contact made: Yes, individual(s) contacted:  Patients daughter-in-law Patient understands diagnosis: Yes. Discussing patient identified problems/goals with staff: Yes. Medical problems stabilized or resolved: Yes.  Denies suicidal/homicidal ideation: Yes. Issues/concerns per patient self-inventory: No. Other:  New problem(s) identified: No, Describe:  None  New Short Term/Long Term Goal(s):  To feel better  Patient Goals:  To feel  better  Discharge Plan or Barriers: To return home and follow up with outpatient treatment.  Reason for Continuation of Hospitalization: Depression Medication stabilization  Estimated Length of Stay: 7 days  Patient Stressors: N/A Patient Goal: Patient will engage in groups without prompting or encouragement from LRT x3 group sessions within 5 recreation therapy group sessions  Attendees: Patient:  01/17/2018 11:33 AM  Physician: [name], MD 01/17/2018 11:33 AM  Nursing:  01/17/2018 11:33 AM  RN Care Manager: 01/17/2018 11:33 AM  Social Worker: Darin Engels, Roy 01/17/2018 11:33 AM  Recreational Therapist:  01/17/2018 11:33 AM  Other:  01/17/2018 11:33 AM  Other:  01/17/2018 11:33 AM  Other: 01/17/2018 11:33 AM    Scribe for Treatment Team: Darin Engels, LCSW 01/17/2018 11:33 AM

## 2018-01-17 NOTE — Anesthesia Procedure Notes (Signed)
Date/Time: 01/17/2018 11:26 AM Performed by: Dionne Bucy, CRNA Pre-anesthesia Checklist: Patient identified, Emergency Drugs available, Suction available and Patient being monitored Patient Re-evaluated:Patient Re-evaluated prior to induction Oxygen Delivery Method: Circle system utilized Preoxygenation: Pre-oxygenation with 100% oxygen Induction Type: IV induction Ventilation: Mask ventilation without difficulty and Mask ventilation throughout procedure Airway Equipment and Method: Bite block Placement Confirmation: positive ETCO2 Dental Injury: Teeth and Oropharynx as per pre-operative assessment

## 2018-01-17 NOTE — Progress Notes (Signed)
[facility] MD Progress Note  01/17/2018 5:55 PM Isabella [name]  MRN:  [id number] Subjective: Follow-up for this patient with severe depression possible schizophrenia.  Patient had second ECT treatment this morning.  Treatment was tolerated without difficulty and had no complications.  Seen this afternoon the patient is getting up out of bed going to the day room to eat.  Appetite is improved.  She still spends most of the rest of her time in bed lying on her side staring into space but she did look up and partially sit up when I came to speak with her.  She told me that she was feeling a little bit better and also was able to complain to me of a headache.  Told me that the medicine she got for the headache was partially helpful.  She told me that she was looking forward to getting out of the hospital so that she could get a job but was not able to articulate any more detail than that. 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 Depression [F32.9] 01/10/2018  . Dehydration [E86.0] 01/10/2018  . Anorexia [R63.0] 01/10/2018  . Schizophrenia ([place]) [F20.9] 12/31/2017  . Social anxiety disorder [F40.10] 08/02/2016  . 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 Bipolar disorder ([place])   . Hypertension   . Migraine 07/13/2016  . Schizophrenia ([place])    History reviewed. No pertinent surgical history. Family History:  Family History  Problem Relation Age of Onset  . Mental illness Neg Hx    Family Psychiatric  History: Unknown Social History:  Social History   Substance and Sexual Activity  Alcohol Use No     Social History   Substance  and Sexual Activity  Drug Use No    Social History   Socioeconomic History  . Marital status: Single    Spouse name: Not on file  . Number of children: Not on file  . Years of education: Not on file  . Highest education level: Not on file  Occupational History  . Not on file  Social Needs  . Financial resource strain: Not on file  . Food insecurity:    Worry: Not on file    Inability: Not on file  . Transportation needs:    Medical: Not on file    Non-medical: Not on file  Tobacco Use  . Smoking status: Smoker, Current Status Unknown  . Smokeless tobacco: Never Used  Substance and Sexual Activity  . Alcohol use: No  . Drug use: No  . Sexual activity: Never  Lifestyle  . Physical activity:    Days per week: Not on file    Minutes per session: Not on file  . Stress: Not on file  Relationships  . Social connections:    Talks on phone: Not on file    Gets together: Not on file    Attends religious service: Not on file    Active member of club or organization: Not on file    Attends meetings of clubs or organizations: Not on file    Relationship status: Not on file  Other Topics Concern  . Not on file  Social History Narrative  . Not  on file   Additional Social History:                         Sleep: Fair  Appetite:  Fair  Current Medications: Current Facility-Administered Medications  Medication Dose Route Frequency Provider Last Rate Last Dose  . acetaminophen (TYLENOL) tablet 650 mg  650 mg Oral Q6H PRN Clapacs, Madie Reno, MD   650 mg at 01/17/18 1447  . alum & mag hydroxide-simeth (MAALOX/MYLANTA) 200-200-20 MG/5ML suspension 30 mL  30 mL Oral Q4H PRN Clapacs, John T, MD      . feeding supplement (ENSURE ENLIVE) (ENSURE ENLIVE) liquid 237 mL  237 mL Oral TID BM Clapacs, Madie Reno, MD   237 mL at 01/16/18 2103  . FLUoxetine (PROZAC) capsule 20 mg  20 mg Oral Daily Clapacs, John T, MD   20 mg at 01/17/18 1448  . hydrOXYzine (ATARAX/VISTARIL) tablet 50 mg   50 mg Oral Q6H PRN Clapacs, Madie Reno, MD      . hyoscyamine (ANASPAZ) disintergrating tablet 0.125 mg  0.125 mg Oral Q6H PRN Clapacs, John T, MD      . magnesium hydroxide (MILK OF MAGNESIA) suspension 30 mL  30 mL Oral Daily PRN Clapacs, John T, MD      . OLANZapine zydis (ZYPREXA) disintegrating tablet 10 mg  10 mg Oral QHS Clapacs, Madie Reno, MD   10 mg at 01/16/18 2147  . traZODone (DESYREL) tablet 100 mg  100 mg Oral QHS PRN Clapacs, Madie Reno, MD   100 mg at 01/16/18 2147    Lab Results:  Results for orders placed or performed during the hospital encounter of 01/10/18 (from the past 48 hour(s))  Glucose, capillary     Status: None   Collection Time: 01/17/18  6:45 AM  Result Value Ref Range   Glucose-Capillary 91 70 - 99 mg/dL   Comment 1 Notify RN     Blood Alcohol level:  Lab Results  Component Value Date   ETH <10 12/29/2017   ETH <5 62/95/2841    Metabolic Disorder Labs: Lab Results  Component Value Date   HGBA1C 5.4 01/31/2016   MPG 108 01/31/2016   Lab Results  Component Value Date   PROLACTIN 124.2 (H) 01/31/2016   Lab Results  Component Value Date   CHOL 152 01/31/2016   TRIG 147 01/31/2016   HDL 39 (L) 01/31/2016   CHOLHDL 3.9 01/31/2016   VLDL 29 01/31/2016   LDLCALC 84 01/31/2016    Physical Findings: AIMS:  , ,  ,  ,    CIWA:    COWS:     Musculoskeletal: Strength & Muscle Tone: within normal limits Gait & Station: normal Patient leans: N/A  Psychiatric Specialty Exam: Physical Exam  Nursing note and vitals reviewed. Constitutional: She appears well-developed and well-nourished.  HENT:  Head: Normocephalic and atraumatic.  Eyes: Pupils are equal, round, and reactive to light. Conjunctivae are normal.  Neck: Normal range of motion.  Cardiovascular: Regular rhythm and normal heart sounds.  Respiratory: Effort normal.  GI: Soft.  Musculoskeletal: Normal range of motion.  Neurological: She is alert.  Skin: Skin is warm and dry.  Psychiatric:  Judgment normal. Her affect is blunt. Her speech is delayed. She is slowed. Cognition and memory are impaired. She expresses no homicidal and no suicidal ideation.    Review of Systems  Constitutional: Negative.   HENT: Negative.   Eyes: Negative.   Respiratory: Negative.   Cardiovascular: Negative.  Gastrointestinal: Negative.   Musculoskeletal: Negative.   Skin: Negative.   Neurological: Negative.   Psychiatric/Behavioral: Positive for depression. Negative for hallucinations, memory loss, substance abuse and suicidal ideas. The patient is not nervous/anxious and does not have insomnia.     Blood pressure (!) 99/58, pulse 81, temperature 98 F (36.7 C), temperature source Oral, resp. rate 16, height 5\' 3"  (1.6 m), weight 82.6 kg, SpO2 97 %.Body mass index is 32.24 kg/m.  General Appearance: Casual  Eye Contact:  Fair  Speech:  Slow  Volume:  Decreased  Mood:  Depressed  Affect:  Congruent  Thought Process:  Coherent  Orientation:  Full (Time, Place, and Person)  Thought Content:  Logical  Suicidal Thoughts:  No  Homicidal Thoughts:  No  Memory:  Immediate;   Fair Recent;   Poor Remote;   Fair  Judgement:  Fair  Insight:  Fair  Psychomotor Activity:  Decreased  Concentration:  Concentration: Fair  Recall:  [employer] of Knowledge:  Fair  Language:  Fair  Akathisia:  No  Handed:  Right  AIMS (if indicated):     Assets:  Housing Physical Health Resilience Social Support  ADL's:  Impaired  Cognition:  Impaired,  Mild  Sleep:  Number of Hours: 7.15     Treatment Plan Summary: Daily contact with patient to assess and evaluate symptoms and progress in treatment, Medication management and Plan Patient tolerating ECT well.  I remain optimistic.  Next ECT treatment scheduled for Friday.  Continue current medication for depression and psychotic symptoms.  Was able to talk with her in a manner that suggested to me she may be a little more alert today and strongly encouraged  her to be active and getting up out of bed and interacting with others on the unit.  Tried to emphasize that if she really think she is ever going to get a job she needs to improve her energy and her ability to tolerate being around other people.  We will follow-up tomorrow.  [name], MD 01/17/2018, 5:55 PM

## 2018-01-18 ENCOUNTER — Other Ambulatory Visit: Payer: Self-pay | Admitting: Psychiatry

## 2018-01-18 MED ORDER — OLANZAPINE 5 MG PO TBDP
15.0000 mg | ORAL_TABLET | Freq: Every day | ORAL | Status: DC
  Administered 2018-01-18 – 2018-01-28 (×11): 15 mg via ORAL
  Filled 2018-01-18 (×11): qty 3

## 2018-01-18 MED ORDER — PHENYLEPHRINE HCL 10 MG/ML IJ SOLN
INTRAMUSCULAR | Status: AC
  Filled 2018-01-18: qty 1

## 2018-01-18 MED ORDER — SEVOFLURANE IN SOLN
RESPIRATORY_TRACT | Status: AC
  Filled 2018-01-18: qty 250

## 2018-01-18 NOTE — Progress Notes (Signed)
MD notified about patient's chest pain. MD informed this writer that patient has tylenol ordered if she requests pain medication.

## 2018-01-18 NOTE — [facility] Group Notes (Signed)
LCSW Group Therapy Note  01/18/2018 1:00pm  Type of Therapy/Topic:  Group Therapy:  Balance in Life  Participation Level:  Did Not Attend  Description of Group:    This group will address the concept of balance and how it feels and looks when one is unbalanced. Patients will be encouraged to process areas in their lives that are out of balance and identify reasons for remaining unbalanced. Facilitators will guide patients in utilizing problem-solving interventions to address and correct the stressor making their life unbalanced. Understanding and applying boundaries will be explored and addressed for obtaining and maintaining a balanced life. Patients will be encouraged to explore ways to assertively make their unbalanced needs known to significant others in their lives, using other group members and facilitator for support and feedback.  Therapeutic Goals: 1. Patient will identify two or more emotions or situations they have that consume much of in their lives. 2. Patient will identify signs/triggers that life has become out of balance:  3. Patient will identify two ways to set boundaries in order to achieve balance in their lives:  4. Patient will demonstrate ability to communicate their needs through discussion and/or role plays  Summary of Patient Progress:  Isabella [name] was invited to today's group, but chose not to attend.    Therapeutic Modalities:   Cognitive Behavioral Therapy Solution-Focused Therapy Assertiveness Training  Isabella [name], [place] 01/18/2018 3:59 PM

## 2018-01-18 NOTE — Progress Notes (Signed)
D- Patient alert and oriented. Patient presents in a sad/depressed mood stating that she slept "well" last night and has some complaints of chest pain that "the more I breathe in the more it hurts". Patient rated her pain level a "6 or 7/10" did not request any pain medication from this writer at this time. Patient rates her depression and anxiety a "7/10" stating that she doesn't know why she's depressed, but states to this writer she is anxious because she is "feeling sad". Patient feels like ECT is "helping a little bit". Patient denies SI, HI, AVH, at this time. Patient's goal is "I want to get out of the hospital.  A- Scheduled medications administered to patient, per MD orders. Support and encouragement provided.  Routine safety checks conducted every 15 minutes.  Patient informed to notify staff with problems or concerns.  R- No adverse drug reactions noted. Patient contracts for safety at this time. Patient compliant with medications and treatment plan. Patient receptive, calm, and cooperative. Patient interacts well with others on the unit.  Patient remains safe at this time.

## 2018-01-18 NOTE — Progress Notes (Signed)
Patient has complaints of mid-chest pain and states that when she inhales the pain seems to get worse. This Probation officer will notify MD.

## 2018-01-18 NOTE — Progress Notes (Signed)
D: Pt denies SI/HI/AVH, and is able to contract for safety. Pt is pleasant and cooperative with assessments this evening with staff direction. Pt. has no Complaints, denies pain or S/S from ECT.  Patient Interaction minimal and is frequently isolative and withdrawn to her room sleeping. Pt. Does report she is doing, "better" and depression is, "5/10". Pt. Denies need for interpreter services. Pt. Does come up for her nighttime medications and is more talkative. Pt. Utilizes very good english with this Conservation officer, nature.     A: Q x 15 minute observation checks were completed for safety. Patient was provided with education, but needs reinforcement.  Patient was given/offered medications per orders. Patient  was encourage to attend groups, participate in unit activities and continue with plan of care. Pt. Chart and plans of care reviewed. Pt. Given support and encouragement.   R: Patient is complaint with medication and unit procedures with direction.              Precautionary checks every 15 minutes for safety maintained, room free of safety hazards, patient sustains no injury or falls during this shift. Will endorse care to next shift.

## 2018-01-18 NOTE — Progress Notes (Signed)
[facility] MD Progress Note  01/18/2018 7:55 PM Isabella [name]  MRN:  [id number] Subjective: Follow-up for this patient with psychotic depression versus schizophrenia.  Patient says "I am better but I am not better".  Admits that she still has hallucinations.  Denies suicidal thoughts.  Admits to me today that while she wants to go home she knows that she cannot function outside the hospital.  Patient still spends most of her time in bed although she is eating adequately.  Had complained of a little bit of chest soreness which sounds almost certainly to be costochondral.  It is better now. 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 Depression [F32.9] 01/10/2018  . Dehydration [E86.0] 01/10/2018  . Anorexia [R63.0] 01/10/2018  . Schizophrenia ([place]) [F20.9] 12/31/2017  . Social anxiety disorder [F40.10] 08/02/2016  . 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 Bipolar disorder ([place])   . Hypertension   . Migraine 07/13/2016  . Schizophrenia ([place])    History reviewed. No pertinent surgical history. Family History:  Family History  Problem Relation Age of Onset  . Mental illness Neg Hx    Family Psychiatric  History: Not clear.  See previous note Social History:  Social History   Substance and Sexual Activity  Alcohol Use No     Social History   Substance and Sexual Activity  Drug Use No    Social History   Socioeconomic History  . Marital status: Single    Spouse name: Not on file  . Number of children: Not on file  . Years of education: Not on file  . Highest education level: Not on file  Occupational History  . Not on file  Social Needs  . Financial resource strain:  Not on file  . Food insecurity:    Worry: Not on file    Inability: Not on file  . Transportation needs:    Medical: Not on file    Non-medical: Not on file  Tobacco Use  . Smoking status: Smoker, Current Status Unknown  . Smokeless tobacco: Never Used  Substance and Sexual Activity  . Alcohol use: No  . Drug use: No  . Sexual activity: Never  Lifestyle  . Physical activity:    Days per week: Not on file    Minutes per session: Not on file  . Stress: Not on file  Relationships  . Social connections:    Talks on phone: Not on file    Gets together: Not on file    Attends religious service: Not on file    Active member of club or organization: Not on file    Attends meetings of clubs or organizations: Not on file    Relationship status: Not on file  Other Topics Concern  . Not on file  Social History Narrative  . Not on file   Additional Social History:                         Sleep: Fair  Appetite:  Fair  Current Medications: Current Facility-Administered Medications  Medication Dose Route Frequency Provider Last Rate Last Dose  . acetaminophen (TYLENOL) tablet 650 mg  650 mg Oral Q6H PRN Hiedi Touchton, Madie Reno, MD  650 mg at 01/18/18 1302  . alum & mag hydroxide-simeth (MAALOX/MYLANTA) 200-200-20 MG/5ML suspension 30 mL  30 mL Oral Q4H PRN Jaimie Redditt T, MD      . feeding supplement (ENSURE ENLIVE) (ENSURE ENLIVE) liquid 237 mL  237 mL Oral TID BM Sharunda Salmon T, MD   237 mL at 01/18/18 1500  . FLUoxetine (PROZAC) capsule 20 mg  20 mg Oral Daily Charde Macfarlane, Madie Reno, MD   20 mg at 01/18/18 3532  . hydrOXYzine (ATARAX/VISTARIL) tablet 50 mg  50 mg Oral Q6H PRN Stefan Markarian, Madie Reno, MD      . hyoscyamine (ANASPAZ) disintergrating tablet 0.125 mg  0.125 mg Oral Q6H PRN Chevi Lim T, MD      . magnesium hydroxide (MILK OF MAGNESIA) suspension 30 mL  30 mL Oral Daily PRN Grantland Want T, MD      . OLANZapine zydis (ZYPREXA) disintegrating tablet 10 mg  10 mg Oral QHS  Demyah Smyre, Madie Reno, MD   10 mg at 01/17/18 2113  . traZODone (DESYREL) tablet 100 mg  100 mg Oral QHS PRN Margaruite Top, Madie Reno, MD   100 mg at 01/16/18 2147    Lab Results:  Results for orders placed or performed during the hospital encounter of 01/10/18 (from the past 48 hour(s))  Glucose, capillary     Status: None   Collection Time: 01/17/18  6:45 AM  Result Value Ref Range   Glucose-Capillary 91 70 - 99 mg/dL   Comment 1 Notify RN     Blood Alcohol level:  Lab Results  Component Value Date   ETH <10 12/29/2017   ETH <5 99/24/2683    Metabolic Disorder Labs: Lab Results  Component Value Date   HGBA1C 5.4 01/31/2016   MPG 108 01/31/2016   Lab Results  Component Value Date   PROLACTIN 124.2 (H) 01/31/2016   Lab Results  Component Value Date   CHOL 152 01/31/2016   TRIG 147 01/31/2016   HDL 39 (L) 01/31/2016   CHOLHDL 3.9 01/31/2016   VLDL 29 01/31/2016   LDLCALC 84 01/31/2016    Physical Findings: AIMS:  , ,  ,  ,    CIWA:    COWS:     Musculoskeletal: Strength & Muscle Tone: within normal limits Gait & Station: normal Patient leans: N/A  Psychiatric Specialty Exam: Physical Exam  Nursing note and vitals reviewed. Constitutional: She appears well-developed and well-nourished.  HENT:  Head: Normocephalic and atraumatic.  Eyes: Pupils are equal, round, and reactive to light. Conjunctivae are normal.  Neck: Normal range of motion.  Cardiovascular: Regular rhythm and normal heart sounds.  Respiratory: Effort normal. No respiratory distress.  GI: Soft.  Musculoskeletal: Normal range of motion.  Neurological: She is alert.  Skin: Skin is warm and dry.  Psychiatric: Her speech is delayed. She is slowed. She expresses impulsivity. She exhibits a depressed mood. She expresses no suicidal ideation. She exhibits abnormal recent memory.    Review of Systems  Constitutional: Negative.   HENT: Negative.   Eyes: Negative.   Respiratory: Negative.   Cardiovascular:  Negative.   Gastrointestinal: Negative.   Musculoskeletal: Negative.   Skin: Negative.   Neurological: Negative.   Psychiatric/Behavioral: Positive for depression, hallucinations and memory loss. Negative for substance abuse and suicidal ideas. The patient is nervous/anxious. The patient does not have insomnia.     Blood pressure (!) 108/56, pulse 80, temperature 98.7 F (37.1 C), temperature source Oral, resp. rate 16, height 5\' 3"  (1.6 m), weight  82.6 kg, SpO2 97 %.Body mass index is 32.24 kg/m.  General Appearance: Casual  Eye Contact:  Fair  Speech:  Slow  Volume:  Decreased  Mood:  Depressed  Affect:  Congruent  Thought Process:  Coherent  Orientation:  Full (Time, Place, and Person)  Thought Content:  Hallucinations: Auditory  Suicidal Thoughts:  No  Homicidal Thoughts:  No  Memory:  Immediate;   Fair Recent;   Fair Remote;   Fair  Judgement:  Fair  Insight:  Fair  Psychomotor Activity:  Decreased  Concentration:  Concentration: Fair  Recall:  [employer] of Knowledge:  Fair  Language:  Fair  Akathisia:  No  Handed:  Right  AIMS (if indicated):     Assets:  Desire for Improvement Housing Physical Health Resilience Social Support  ADL's:  Impaired  Cognition:  Impaired,  Mild  Sleep:  Number of Hours: 8     Treatment Plan Summary: Daily contact with patient to assess and evaluate symptoms and progress in treatment, Medication management and Plan Spent some time with psychoeducation and encouragement.  Encouraged her to attend groups more.  ECT treatment again tomorrow morning which is proceeding well with no complications.  Medications will be reviewed and I may consider increasing antipsychotic doses.  Patient agrees to all of this.  Follow-up tomorrow patient psychosis and behavior continue to prevent any thought of discharge  [name], MD 01/18/2018, 7:55 PM

## 2018-01-18 NOTE — Anesthesia Postprocedure Evaluation (Signed)
Anesthesia Post Note  Patient: Isabella [name]  Procedure(s) Performed: ECT TX  Patient location during evaluation: PACU Anesthesia Type: General Level of consciousness: awake and alert Pain management: pain level controlled Vital Signs Assessment: post-procedure vital signs reviewed and stable Respiratory status: spontaneous breathing, nonlabored ventilation and respiratory function stable Cardiovascular status: blood pressure returned to baseline and stable Postop Assessment: no apparent nausea or vomiting Anesthetic complications: no     Last Vitals:  Vitals:   01/17/18 1441 01/18/18 0616  BP: (!) 99/58 (!) 108/56  Pulse: 81 80  Resp:  16  Temp: 36.7 C 37.1 C  SpO2: 97% 97%    Last Pain:  Vitals:   01/18/18 1302  TempSrc:   PainSc: [place]

## 2018-01-18 NOTE — Plan of Care (Signed)
Pt. Verbalizes she can remain safe while on the unit. Pt. Denies need for interpreter services. Pt. With this Probation officer and staff is using very good english. Pt. Reports doing, "much better" this evening. Pt. Reports depression, "5/10".  Problem: Education: Goal: Emotional status will improve Outcome: Progressing Goal: Mental status will improve Outcome: Progressing   Problem: Safety: Goal: Ability to remain free from injury will improve Outcome: Progressing

## 2018-01-18 NOTE — Progress Notes (Signed)
Recreation Therapy Notes   Date: 01/18/2018  Time: 9:30 am   Location: Craft room   Behavioral response: N/A   Intervention Topic: Coping  Discussion/Intervention: Patient did not attend group.   Clinical Observations/Feedback:  Patient did not attend group.   Christinna Sprung LRT/CTRS        Louis Ivery 01/18/2018 10:44 AM

## 2018-01-19 ENCOUNTER — Inpatient Hospital Stay: Payer: Medicaid Other | Admitting: Anesthesiology

## 2018-01-19 ENCOUNTER — Encounter: Payer: Self-pay | Admitting: *Deleted

## 2018-01-19 LAB — GLUCOSE, CAPILLARY: GLUCOSE-CAPILLARY: 102 mg/dL — AB (ref 70–99)

## 2018-01-19 LAB — POCT PREGNANCY, URINE: Preg Test, Ur: NEGATIVE

## 2018-01-19 MED ORDER — SODIUM CHLORIDE 0.9 % IV SOLN
INTRAVENOUS | Status: DC | PRN
  Administered 2018-01-19: 11:00:00 via INTRAVENOUS

## 2018-01-19 MED ORDER — SODIUM CHLORIDE 0.9 % IV SOLN
500.0000 mL | Freq: Once | INTRAVENOUS | Status: AC
  Administered 2018-01-19: 500 mL via INTRAVENOUS

## 2018-01-19 MED ORDER — KETOROLAC TROMETHAMINE 30 MG/ML IJ SOLN
30.0000 mg | Freq: Once | INTRAMUSCULAR | Status: AC
  Administered 2018-01-19: 30 mg via INTRAVENOUS

## 2018-01-19 MED ORDER — METHOHEXITAL SODIUM 100 MG/10ML IV SOSY
PREFILLED_SYRINGE | INTRAVENOUS | Status: DC | PRN
  Administered 2018-01-19: 80 mg via INTRAVENOUS

## 2018-01-19 MED ORDER — KETOROLAC TROMETHAMINE 30 MG/ML IJ SOLN
INTRAMUSCULAR | Status: AC
  Filled 2018-01-19: qty 1

## 2018-01-19 MED ORDER — SUCCINYLCHOLINE CHLORIDE 20 MG/ML IJ SOLN
INTRAMUSCULAR | Status: DC | PRN
  Administered 2018-01-19: 100 mg via INTRAVENOUS

## 2018-01-19 MED ORDER — GLYCOPYRROLATE 0.2 MG/ML IJ SOLN
0.1000 mg | Freq: Once | INTRAMUSCULAR | Status: AC
  Administered 2018-01-19: 0.1 mg via INTRAVENOUS

## 2018-01-19 MED ORDER — GLYCOPYRROLATE 0.2 MG/ML IJ SOLN
INTRAMUSCULAR | Status: AC
  Administered 2018-01-19: 0.1 mg via INTRAVENOUS
  Filled 2018-01-19: qty 1

## 2018-01-19 NOTE — Progress Notes (Signed)
D:Pt denies SI/HI/AVH, and is able to contract for safety. Pt is pleasant and cooperative with assessments this evening with staff direction. Pt. A bit more minimal with this writer then last night. Pt.has no Complaints, denies pain and/or costochondral pain .Patient is still frequently isolative and withdrawn to her room sleeping very often. Pt. Does report she is doing worse this evening and depression is, "8/10" with anxiety, "7/10". Denies needs for PRN medications. Pt. Denies need for interpreter services. Pt. Does come up for her nighttime medications, but requires lots of encouragement. Pt. Utilizes very good english with this Probation officer and staff still.     A: Q x 15 minute observation checks were completed for safety. Patient was provided with education, but needs reinforcement. Patient was given/offered medications per orders. Patient was encourage to attend groups, participate in unit activities and continue with plan of care. Pt. Chart and plans of care reviewed. Pt. Given support and encouragement.  R:Patient is complaint with medication and unit procedures with direction. Pt. Does not attend snacks or group this evening. Pt. highly encouraged to attend to hygiene with no success. Pt. Verbalizes understanding of NPO for ECT and blood sugar monitoring pre-ECT.             Precautionary checks every 15 minutes for safety maintained, room free of safety hazards, patient sustains no injury or falls during this shift. Will endorse care to next shift.

## 2018-01-19 NOTE — Progress Notes (Signed)
[facility] MD Progress Note  01/19/2018 5:18 PM Isabella [name]  MRN:  [id number] Subjective: Patient seems to be doing much better.  She had ECT treatment today which as usual was completed without any complication.  Seen this afternoon she is sitting up and was speaking in a louder tone of voice.  Said she was feeling better.  Denied any hallucinations denies suicidal ideation.  She has improved her hygiene considerably and is starting to get more physically active. 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 Depression [F32.9] 01/10/2018  . Dehydration [E86.0] 01/10/2018  . Anorexia [R63.0] 01/10/2018  . Schizophrenia ([place]) [F20.9] 12/31/2017  . Social anxiety disorder [F40.10] 08/02/2016  . 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  Starting to show some response to ECT  Past Medical History:  Past Medical History:  Diagnosis Date  . Bipolar disorder ([place])   . Hypertension   . Migraine 07/13/2016  . Schizophrenia ([place])    History reviewed. No pertinent surgical history. Family History:  Family History  Problem Relation Age of Onset  . Mental illness Neg Hx    Family Psychiatric  History: See previous notes Social History:  Social History   Substance and Sexual Activity  Alcohol Use No     Social History   Substance and Sexual Activity  Drug Use No    Social History   Socioeconomic History  . Marital status: Single    Spouse name: Not on file  . Number of children: Not on file  . Years of education: Not on file  . Highest education level: Not on file  Occupational History  . Not on file  Social Needs  . Financial resource strain: Not on file  . Food insecurity:    Worry: Not on file    Inability: Not on file  . Transportation needs:     Medical: Not on file    Non-medical: Not on file  Tobacco Use  . Smoking status: Smoker, Current Status Unknown  . Smokeless tobacco: Never Used  Substance and Sexual Activity  . Alcohol use: No  . Drug use: No  . Sexual activity: Never  Lifestyle  . Physical activity:    Days per week: Not on file    Minutes per session: Not on file  . Stress: Not on file  Relationships  . Social connections:    Talks on phone: Not on file    Gets together: Not on file    Attends religious service: Not on file    Active member of club or organization: Not on file    Attends meetings of clubs or organizations: Not on file    Relationship status: Not on file  Other Topics Concern  . Not on file  Social History Narrative  . Not on file   Additional Social History:                         Sleep: Fair  Appetite:  Fair  Current Medications: Current Facility-Administered Medications  Medication Dose Route Frequency Provider Last Rate Last Dose  . acetaminophen (TYLENOL) tablet 650 mg  650 mg Oral Q6H PRN Cinde Ebert, Madie Reno, MD   650 mg at 01/18/18 1302  . alum & mag hydroxide-simeth (MAALOX/MYLANTA) 200-200-20 MG/5ML suspension 30 mL  30  mL Oral Q4H PRN Majour Frei T, MD      . feeding supplement (ENSURE ENLIVE) (ENSURE ENLIVE) liquid 237 mL  237 mL Oral TID BM Antoinette Borgwardt T, MD   237 mL at 01/19/18 1232  . FLUoxetine (PROZAC) capsule 20 mg  20 mg Oral Daily Prateek Knipple T, MD   20 mg at 01/19/18 1232  . hydrOXYzine (ATARAX/VISTARIL) tablet 50 mg  50 mg Oral Q6H PRN Danetra Glock T, MD      . hyoscyamine (ANASPAZ) disintergrating tablet 0.125 mg  0.125 mg Oral Q6H PRN Allyanna Appleman T, MD      . ketorolac (TORADOL) 30 MG/ML injection           . magnesium hydroxide (MILK OF MAGNESIA) suspension 30 mL  30 mL Oral Daily PRN Griffyn Kucinski T, MD      . OLANZapine zydis (ZYPREXA) disintegrating tablet 15 mg  15 mg Oral QHS Viha Kriegel, Madie Reno, MD   15 mg at 01/18/18 2120  . traZODone  (DESYREL) tablet 100 mg  100 mg Oral QHS PRN Tagen Milby, Madie Reno, MD   100 mg at 01/16/18 2147    Lab Results:  Results for orders placed or performed during the hospital encounter of 01/10/18 (from the past 48 hour(s))  Glucose, capillary     Status: Abnormal   Collection Time: 01/19/18  5:30 AM  Result Value Ref Range   Glucose-Capillary 102 (H) 70 - 99 mg/dL  Pregnancy, urine POC     Status: None   Collection Time: 01/19/18  8:43 AM  Result Value Ref Range   Preg Test, Ur NEGATIVE NEGATIVE    Comment:        THE SENSITIVITY OF THIS METHODOLOGY IS >24 mIU/mL     Blood Alcohol level:  Lab Results  Component Value Date   ETH <10 12/29/2017   ETH <5 69/67/8938    Metabolic Disorder Labs: Lab Results  Component Value Date   HGBA1C 5.4 01/31/2016   MPG 108 01/31/2016   Lab Results  Component Value Date   PROLACTIN 124.2 (H) 01/31/2016   Lab Results  Component Value Date   CHOL 152 01/31/2016   TRIG 147 01/31/2016   HDL 39 (L) 01/31/2016   CHOLHDL 3.9 01/31/2016   VLDL 29 01/31/2016   LDLCALC 84 01/31/2016    Physical Findings: AIMS:  , ,  ,  ,    CIWA:    COWS:     Musculoskeletal: Strength & Muscle Tone: within normal limits Gait & Station: normal Patient leans: N/A  Psychiatric Specialty Exam: Physical Exam  Nursing note and vitals reviewed. Constitutional: She appears well-developed and well-nourished.  HENT:  Head: Normocephalic and atraumatic.  Eyes: Pupils are equal, round, and reactive to light. Conjunctivae are normal.  Neck: Normal range of motion.  Cardiovascular: Regular rhythm and normal heart sounds.  Respiratory: Effort normal. No respiratory distress.  GI: Soft.  Musculoskeletal: Normal range of motion.  Neurological: She is alert.  Skin: Skin is warm and dry.  Psychiatric: Judgment normal. Her affect is blunt. Her speech is delayed. She is slowed. She expresses no suicidal ideation. She exhibits abnormal recent memory.    Review of  Systems  Constitutional: Negative.   HENT: Negative.   Eyes: Negative.   Respiratory: Negative.   Cardiovascular: Negative.   Gastrointestinal: Negative.   Musculoskeletal: Negative.   Skin: Negative.   Neurological: Negative.   Psychiatric/Behavioral: Positive for memory loss. Negative for depression, hallucinations, substance abuse and suicidal  ideas. The patient is not nervous/anxious and does not have insomnia.     Blood pressure 112/76, pulse 79, temperature 98.6 F (37 C), temperature source Oral, resp. rate 18, height 5\' 3"  (1.6 m), weight 83.5 kg, last menstrual period 12/17/2017, SpO2 96 %.Body mass index is 32.59 kg/m.  General Appearance: Casual  Eye Contact:  Fair  Speech:  Clear and Coherent and Slow  Volume:  Decreased  Mood:  Dysphoric  Affect:  Constricted  Thought Process:  Goal Directed  Orientation:  Full (Time, Place, and Person)  Thought Content:  Logical  Suicidal Thoughts:  No  Homicidal Thoughts:  No  Memory:  Immediate;   Fair Recent;   Fair Remote;   Fair  Judgement:  Fair  Insight:  Fair  Psychomotor Activity:  Normal  Concentration:  Concentration: Fair  Recall:  [employer] of Knowledge:  Fair  Language:  Fair  Akathisia:  Yes  Handed:  Right  AIMS (if indicated):     Assets:  Desire for Improvement  ADL's:  Intact  Cognition:  WNL  Sleep:  Number of Hours: 8     Treatment Plan Summary: Daily contact with patient to assess and evaluate symptoms and progress in treatment, Medication management and Plan Continue ECT and to next week.  I increased her home olanzapine last night which is being tolerated without any difficulty.  Explained plan to patient supportive counseling and encouragement.  [name], MD 01/19/2018, 5:18 PM

## 2018-01-19 NOTE — Anesthesia Post-op Follow-up Note (Signed)
Anesthesia QCDR form completed.        

## 2018-01-19 NOTE — Progress Notes (Signed)
Recreation Therapy Notes  Date: 01/19/2018  Time: 9:30 pm   Location: Craft Room   Behavioral response: N/A   Intervention Topic: Happiness  Discussion/Intervention: Patient did not attend group.   Clinical Observations/Feedback:  Patient did not attend group.   Kellsey Sansone LRT/CTRS        Nygeria Lager 01/19/2018 1:47 PM

## 2018-01-19 NOTE — Plan of Care (Signed)
Pt. Denies SI/HI and verbally contracts for safety. Pt. Reports safety on the unit.    Problem: Safety: Goal: Ability to remain free from injury will improve Outcome: Progressing

## 2018-01-19 NOTE — Transfer of Care (Signed)
Immediate Anesthesia Transfer of Care Note  Patient: Isabella [name]  Procedure(s) Performed: ECT TX  Patient Location: PACU  Anesthesia Type:General  Level of Consciousness: awake, alert  and oriented  Airway & Oxygen Therapy: Patient Spontanous Breathing and Patient connected to face mask oxygen  Post-op Assessment: Report given to RN and Post -op Vital signs reviewed and stable  Post vital signs: Reviewed and stable  Last Vitals:  Vitals Value Taken Time  BP 129/102 01/19/2018 11:45 AM  Temp    Pulse 69 01/19/2018 11:45 AM  Resp 26 01/19/2018 11:45 AM  SpO2 97 % 01/19/2018 11:45 AM  Vitals shown include unvalidated device data.  Last Pain:  Vitals:   01/19/18 0936  TempSrc: Oral  PainSc:          Complications: No apparent anesthesia complications

## 2018-01-19 NOTE — Anesthesia Preprocedure Evaluation (Signed)
Anesthesia Evaluation  Patient identified by MRN, date of birth, ID band Patient awake    Reviewed: Allergy & Precautions, H&P , NPO status , Patient's Chart, lab work & pertinent test results, reviewed documented beta blocker date and time   Airway Mallampati: II   Neck ROM: full    Dental  (+) Poor Dentition   Pulmonary neg pulmonary ROS,    Pulmonary exam normal        Cardiovascular Exercise Tolerance: Poor hypertension, On Medications negative cardio ROS Normal cardiovascular exam Rhythm:regular Rate:Normal     Neuro/Psych  Headaches, PSYCHIATRIC DISORDERS Anxiety Depression Bipolar Disorder Schizophrenia negative neurological ROS  negative psych ROS   GI/Hepatic negative GI ROS, Neg liver ROS,   Endo/Other  negative endocrine ROS  Renal/GU negative Renal ROS  negative genitourinary   Musculoskeletal   Abdominal   Peds  Hematology negative hematology ROS (+)   Anesthesia Other Findings Past Medical History: No date: Bipolar disorder ([place]) No date: Hypertension 07/13/2016: Migraine No date: Schizophrenia ([place]) History reviewed. No pertinent surgical history. BMI    Body Mass Index:  32.59 kg/m     Reproductive/Obstetrics negative OB ROS                             Anesthesia Physical Anesthesia Plan  ASA: II  Anesthesia Plan: General   Post-op Pain Management:    Induction:   PONV Risk Score and Plan:   Airway Management Planned:   Additional Equipment:   Intra-op Plan:   Post-operative Plan:   Informed Consent: I have reviewed the patients History and Physical, chart, labs and discussed the procedure including the risks, benefits and alternatives for the proposed anesthesia with the patient or authorized representative who has indicated his/her understanding and acceptance.   Dental Advisory Given  Plan Discussed with: CRNA  Anesthesia Plan Comments:          Anesthesia Quick Evaluation

## 2018-01-19 NOTE — B[facility] Group Notes (Signed)
[facility] LCSW Group Therapy Note  Date/Time: 01/19/18, 1300  Type of Therapy and Topic:  Group Therapy:  Feelings around Relapse and Recovery  Participation Level:  Did Not Attend   Mood:  Description of Group:    Patients in this group will discuss emotions they experience before and after a relapse. They will process how experiencing these feelings, or avoidance of experiencing them, relates to having a relapse. Facilitator will guide patients to explore emotions they have related to recovery. Patients will be encouraged to process which emotions are more powerful. They will be guided to discuss the emotional reaction significant others in their lives may have to patients' relapse or recovery. Patients will be assisted in exploring ways to respond to the emotions of others without this contributing to a relapse.  Therapeutic Goals: 1. Patient will identify two or more emotions that lead to relapse for them:  2. Patient will identify two emotions that result when they relapse:  3. Patient will identify two emotions related to recovery:  4. Patient will demonstrate ability to communicate their needs through discussion and/or role plays.   Summary of Patient Progress:     Therapeutic Modalities:   Cognitive Behavioral Therapy Solution-Focused Therapy Assertiveness Training Relapse Prevention Therapy  Lurline Idol, LCSW

## 2018-01-19 NOTE — Plan of Care (Signed)
Patient stated that ECT is helping with her depression.Patient denies SI,HI and AVH.Patient did not maintain personal hygiene before ECT even with much encouragement.After ECT patient had a good shower.Visible in the milieu.Patient stated that her goal is to get a good job when she gets out of here. Appetite and energy level good.Support and encouragement given.

## 2018-01-19 NOTE — Procedures (Signed)
ECT SERVICES Physician's Interval Evaluation & Treatment Note  Patient Identification: Isabella [name] MRN:  [id number] Date of Evaluation:  01/19/2018 TX #: [id number]  MADRS:   MMSE:   P.E. Findings:  none  Psychiatric Interval Note:  More alert  Subjective:  Patient is a 42 y.o. female seen for evaluation for Electroconvulsive Therapy. better  Treatment Summary:   []   Right Unilateral             [x]  Bilateral   % Energy : 1.0 ms 45%   Impedance: 1400ohm  Seizure Energy Index: 18418 mv2  Postictal Suppression Index: 95%  Seizure Concordance Index: 99%  Medications  Pre Shock: rob 0.1, tor 30,brev 80,suc100  Post Shock:    Seizure Duration: 31 emg 38 eeg   Comments: monday   Lungs:  [x]   Clear to auscultation               []  Other:   Heart:    [x]   Regular rhythm             []  irregular rhythm    [x]   Previous H&P reviewed, patient examined and there are NO CHANGES                 []   Previous H&P reviewed, patient examined and there are changes noted.   [name], MD 9/6/201911:31 AM

## 2018-01-19 NOTE — H&P (Signed)
Isabella [name] is an 42 y.o. female.   Chief Complaint: none HPI: psychotic depression  Past Medical History:  Diagnosis Date  . Bipolar disorder ([place])   . Hypertension   . Migraine 07/13/2016  . Schizophrenia ([place])     History reviewed. No pertinent surgical history.  Family History  Problem Relation Age of Onset  . Mental illness Neg Hx    Social History:  reports that she has been smoking. She has never used smokeless tobacco. She reports that she does not drink alcohol or use drugs.  Allergies: No Known Allergies  Medications Prior to Admission  Medication Sig Dispense Refill  . acetaminophen (TYLENOL) 325 MG tablet Take 2 tablets (650 mg total) by mouth every 6 (six) hours as needed for mild pain.    Marland Kitchen alum & mag hydroxide-simeth (MAALOX/MYLANTA) 200-200-20 MG/5ML suspension Take 30 mLs by mouth every 4 (four) hours as needed for indigestion. 355 mL 0  . ARIPiprazole (ABILIFY) 20 MG tablet Take 1 tablet (20 mg total) by mouth daily. For mood control    . escitalopram (LEXAPRO) 5 MG tablet Take 3 tablets (15 mg total) by mouth daily. For depression    . feeding supplement, ENSURE ENLIVE, (ENSURE ENLIVE) LIQD Take 237 mLs by mouth 2 (two) times daily between meals. For nutritional supplementation 237 mL 12  . hydrOXYzine (ATARAX/VISTARIL) 50 MG tablet Take 1 tablet (50 mg total) by mouth every 6 (six) hours as needed for anxiety. 1 tablet 0  . hyoscyamine (ANASPAZ) 0.125 MG TBDP disintergrating tablet Take 1 tablet (0.125 mg total) by mouth every 4 (four) hours as needed for cramping.  0  . magnesium hydroxide (MILK OF MAGNESIA) 400 MG/5ML suspension Take 30 mLs by mouth daily as needed for mild constipation. 360 mL 0  . OLANZapine zydis (ZYPREXA) 5 MG disintegrating tablet Take 1 tablet (5 mg total) by mouth every 8 (eight) hours as needed (agitation/psychosis).    . traZODone (DESYREL) 100 MG tablet Take 1 tablet (100 mg total) by mouth at bedtime as needed for sleep.       Results for orders placed or performed during the hospital encounter of 01/10/18 (from the past 48 hour(s))  Glucose, capillary     Status: Abnormal   Collection Time: 01/19/18  5:30 AM  Result Value Ref Range   Glucose-Capillary 102 (H) 70 - 99 mg/dL  Pregnancy, urine POC     Status: None   Collection Time: 01/19/18  8:43 AM  Result Value Ref Range   Preg Test, Ur NEGATIVE NEGATIVE    Comment:        THE SENSITIVITY OF THIS METHODOLOGY IS >24 mIU/mL    No results found.  Review of Systems  Constitutional: Negative.   HENT: Negative.   Eyes: Negative.   Respiratory: Negative.   Cardiovascular: Negative.   Gastrointestinal: Negative.   Musculoskeletal: Negative.   Skin: Negative.   Neurological: Negative.   Psychiatric/Behavioral: Negative.     Blood pressure 105/67, pulse 71, temperature 97.6 F (36.4 C), temperature source Oral, resp. rate 16, height 5\' 3"  (1.6 m), weight 83.5 kg, last menstrual period 12/17/2017, SpO2 98 %. Physical Exam  Nursing note and vitals reviewed. Constitutional: She appears well-developed and well-nourished.  HENT:  Head: Normocephalic and atraumatic.  Eyes: Pupils are equal, round, and reactive to light. Conjunctivae are normal.  Neck: Normal range of motion.  Cardiovascular: Regular rhythm and normal heart sounds.  Respiratory: Effort normal.  GI: Soft.  Musculoskeletal: Normal range of  motion.  Neurological: She is alert.  Skin: Skin is warm and dry.  Psychiatric: Her speech is delayed. She is slowed. Cognition and memory are impaired. She expresses inappropriate judgment. She exhibits a depressed mood. She expresses no suicidal ideation.     Assessment/Plan Next tx mon  [name], MD 01/19/2018, 11:30 AM

## 2018-01-20 NOTE — [facility] Group Notes (Signed)
[place] Group Notes:  (Nursing/MHT/Case Management/Adjunct)  Date:  01/20/2018  Time:  12:54 AM  Type of Therapy:  Group Therapy  Participation Level:  Did Not Attend   Barnie Mort 01/20/2018, 12:54 AM

## 2018-01-20 NOTE — Progress Notes (Signed)
D: Pt denies SI/HI and verbally is able to contract for safety. Pt. Denies AVH during assessments, but after some persistence states, "well I've had them, but not today". Pt is pleasant and cooperative. Pt. has no Complaints, denies s/s and/or pain from ECT.  Patient Interaction minimal and continues to be frequently isolative and withdrawn. Pt. Does get up and attend meals. Appetite is drastically improved eating all of her meals for the most part. Pt. Depression and anxiety, "5/10" for both. Pt. Did shower, but not fully attending to ADLs. Pt. Reports feeling better today. Pt. Up and speaking with much more clarity today.    A: Q x 15 minute observation checks were completed for safety. Patient was provided with education, but needs reinforcement.  Patient was given/offered medications per orders. Patient  was encourage to attend groups, participate in unit activities and continue with plan of care. Pt. Chart and plans of care reviewed. Pt. Given support and encouragement.   R: Patient is complaint with medication and unit procedures. Participation still poor.              Precautionary checks every 15 minutes for safety maintained, room free of safety hazards, patient sustains no injury or falls during this shift. Will endorse care to next shift.

## 2018-01-20 NOTE — Progress Notes (Signed)
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  Has had ECT 3 times now.  Staff tell me that she is clearly more energetic and functioning better.  Patient still spends most of her time in bed lying on her left side staring into space but when I come in is able to engage in some conversation.  Still has limited insight and is only minimally able to elaborate on any plans.  Denies suicidal thoughts.  She is taking better care of her hygiene and is eating better. Principal Problem: Schizophrenia, catatonic type [facility]) Diagnosis:   Patient Active Problem List   Diagnosis Date Noted  . Depression [F32.9] 01/10/2018  . Dehydration [E86.0] 01/10/2018  . Anorexia [R63.0] 01/10/2018  . Schizophrenia ([place]) [F20.9] 12/31/2017  . Social anxiety disorder [F40.10] 08/02/2016  . 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  Past Medical History:  Past Medical History:  Diagnosis Date  . Bipolar disorder ([place])   . Hypertension   . Migraine 07/13/2016  . Schizophrenia ([place])    History reviewed. No pertinent surgical history. Family History:  Family History  Problem Relation Age of Onset  . Mental illness Neg Hx    Family Psychiatric  History: See previous note Social History:  Social History   Substance and Sexual Activity  Alcohol Use No     Social History   Substance and Sexual Activity  Drug Use No    Social History   Socioeconomic History  . Marital status: Single    Spouse name: Not on file  . Number of children: Not on file  . Years of education: Not on file  . Highest education level: Not on file  Occupational History  . Not on file   Social Needs  . Financial resource strain: Not on file  . Food insecurity:    Worry: Not on file    Inability: Not on file  . Transportation needs:    Medical: Not on file    Non-medical: Not on file  Tobacco Use  . Smoking status: Smoker, Current Status Unknown  . Smokeless tobacco: Never Used  Substance and Sexual Activity  . Alcohol use: No  . Drug use: No  . Sexual activity: Never  Lifestyle  . Physical activity:    Days per week: Not on file    Minutes per session: Not on file  . Stress: Not on file  Relationships  . Social connections:    Talks on phone: Not on file    Gets together: Not on file    Attends religious service: Not on file    Active member of club or organization: Not on file    Attends meetings of clubs or organizations: Not on file    Relationship status: Not on file  Other Topics Concern  . Not on file  Social History Narrative  . Not on file   Additional Social History:                         Sleep: Fair  Appetite:  Fair  Current Medications: Current Facility-Administered Medications  Medication Dose Route Frequency Provider Last Rate Last Dose  . acetaminophen (TYLENOL) tablet 650 mg  650 mg Oral  Q6H PRN Berda Shelvin, Madie Reno, MD   650 mg at 01/18/18 1302  . alum & mag hydroxide-simeth (MAALOX/MYLANTA) 200-200-20 MG/5ML suspension 30 mL  30 mL Oral Q4H PRN Drevon Plog T, MD      . feeding supplement (ENSURE ENLIVE) (ENSURE ENLIVE) liquid 237 mL  237 mL Oral TID BM Kenedie Dirocco T, MD   237 mL at 01/20/18 1416  . FLUoxetine (PROZAC) capsule 20 mg  20 mg Oral Daily Haniyah Maciolek T, MD   20 mg at 01/20/18 0818  . hydrOXYzine (ATARAX/VISTARIL) tablet 50 mg  50 mg Oral Q6H PRN Shandria Clinch T, MD      . hyoscyamine (ANASPAZ) disintergrating tablet 0.125 mg  0.125 mg Oral Q6H PRN Karmella Bouvier T, MD      . magnesium hydroxide (MILK OF MAGNESIA) suspension 30 mL  30 mL Oral Daily PRN Arcelia Pals T, MD      . OLANZapine zydis (ZYPREXA)  disintegrating tablet 15 mg  15 mg Oral QHS Jocelynne Duquette T, MD   15 mg at 01/19/18 2200  . traZODone (DESYREL) tablet 100 mg  100 mg Oral QHS PRN Davionna Blacksher, Madie Reno, MD   100 mg at 01/19/18 2330    Lab Results:  Results for orders placed or performed during the hospital encounter of 01/10/18 (from the past 48 hour(s))  Glucose, capillary     Status: Abnormal   Collection Time: 01/19/18  5:30 AM  Result Value Ref Range   Glucose-Capillary 102 (H) 70 - 99 mg/dL  Pregnancy, urine POC     Status: None   Collection Time: 01/19/18  8:43 AM  Result Value Ref Range   Preg Test, Ur NEGATIVE NEGATIVE    Comment:        THE SENSITIVITY OF THIS METHODOLOGY IS >24 mIU/mL     Blood Alcohol level:  Lab Results  Component Value Date   ETH <10 12/29/2017   ETH <5 40/34/7425    Metabolic Disorder Labs: Lab Results  Component Value Date   HGBA1C 5.4 01/31/2016   MPG 108 01/31/2016   Lab Results  Component Value Date   PROLACTIN 124.2 (H) 01/31/2016   Lab Results  Component Value Date   CHOL 152 01/31/2016   TRIG 147 01/31/2016   HDL 39 (L) 01/31/2016   CHOLHDL 3.9 01/31/2016   VLDL 29 01/31/2016   LDLCALC 84 01/31/2016    Physical Findings: AIMS:  , ,  ,  ,    CIWA:    COWS:     Musculoskeletal: Strength & Muscle Tone: within normal limits Gait & Station: normal Patient leans: N/A  Psychiatric Specialty Exam: Physical Exam  Nursing note and vitals reviewed. Constitutional: She appears well-developed and well-nourished.  HENT:  Head: Normocephalic and atraumatic.  Eyes: Pupils are equal, round, and reactive to light. Conjunctivae are normal.  Neck: Normal range of motion.  Cardiovascular: Regular rhythm and normal heart sounds.  Respiratory: Effort normal. No respiratory distress.  GI: Soft.  Musculoskeletal: Normal range of motion.  Neurological: She is alert.  Skin: Skin is warm and dry.  Psychiatric: Her affect is blunt. Her speech is delayed. She is slowed.  Cognition and memory are impaired. She expresses inappropriate judgment. She expresses no homicidal and no suicidal ideation.    Review of Systems  Constitutional: Negative.   HENT: Negative.   Eyes: Negative.   Respiratory: Negative.   Cardiovascular: Negative.   Gastrointestinal: Negative.   Musculoskeletal: Negative.   Skin: Negative.   Neurological:  Negative.   Psychiatric/Behavioral: Positive for memory loss. Negative for depression, hallucinations, substance abuse and suicidal ideas. The patient is not nervous/anxious and does not have insomnia.     Blood pressure 117/76, pulse 95, temperature 100.3 F (37.9 C), temperature source Oral, resp. rate 16, height 5\' 3"  (1.6 m), weight 83.5 kg, last menstrual period 12/17/2017, SpO2 98 %.Body mass index is 32.59 kg/m.  General Appearance: Fairly Groomed  Eye Contact:  Fair  Speech:  Slow  Volume:  Decreased  Mood:  Euthymic  Affect:  Constricted  Thought Process:  Coherent  Orientation:  Full (Time, Place, and Person)  Thought Content:  Tangential  Suicidal Thoughts:  No  Homicidal Thoughts:  No  Memory:  Immediate;   Fair Recent;   Poor Remote;   Fair  Judgement:  Impaired  Insight:  Shallow  Psychomotor Activity:  Decreased  Concentration:  Concentration: Poor  Recall:  [employer] of Knowledge:  Fair  Language:  Fair  Akathisia:  No  Handed:  Right  AIMS (if indicated):     Assets:  Desire for Improvement Housing Physical Health Social Support  ADL's:  Impaired  Cognition:  Impaired,  Mild  Sleep:  Number of Hours: 8.3     Treatment Plan Summary: Daily contact with patient to assess and evaluate symptoms and progress in treatment, Medication management and Plan Patient is clearly showing a little bit of improvement but still remains pretty withdrawn and flat.  I think it is still unclear to me whether this is more of a major depression or a schizophrenia or schizoaffective kind of presentation.  I am glad that we  increase the dose of Zyprexa which she is tolerating fine.  Was able to do a little bit of communication with her today and encouraged her to please try to be up out of bed moving around attending some groups.  Patient halfheartedly suggested that she agreed.  Her insight and judgment and behavior are still too impaired to allow for discharge.  Next ECT scheduled for Monday.  [name], MD 01/20/2018, 3:26 PM

## 2018-01-20 NOTE — Plan of Care (Signed)
  Problem: Coping: Goal: Coping ability will improve Outcome: Progressing  Patient is coping effectively and is interacting with peers and staff.

## 2018-01-20 NOTE — Plan of Care (Signed)
Pt. Denies si/hi and verbally is able to contract for safety. Pt. Needs reinforcement on provided education. Pt. Continues to report depression and anxiety, but does report feeling, "much better". Pt. Presents much clearer during assessments and engages well. Pt. Is pleasant and cooperative.    Problem: Education: Goal: Knowledge of  General Education information/materials will improve 01/20/2018 1051 by Reyes Ivan, RN Outcome: Not Progressing 01/20/2018 1049 by Reyes Ivan, RN Outcome: Not Progressing   Problem: Education: Goal: Emotional status will improve 01/20/2018 1051 by Reyes Ivan, RN Outcome: Progressing 01/20/2018 1049 by Reyes Ivan, RN Outcome: Not Progressing Goal: Mental status will improve 01/20/2018 1051 by Reyes Ivan, RN Outcome: Progressing 01/20/2018 1049 by Reyes Ivan, RN Outcome: Not Progressing   Problem: Safety: Goal: Ability to remain free from injury will improve 01/20/2018 1051 by Reyes Ivan, RN Outcome: Progressing 01/20/2018 1049 by Reyes Ivan, RN Outcome: Progressing

## 2018-01-20 NOTE — Progress Notes (Signed)
D: Patient denies SI/HI/AVH, affect is flat but it brightens upon approach, mood is pleasant and cooperative.she stated she feels better from doing ECT,she appears less anxious and she is interacting with peers and staff appropriately, accepting phone calls from family.  A: Patient was offered support and encouragement, and was  given scheduled medications. Patient was encouraged to attend groups. 15 minute checks were done for safety.  R: Patient did not attend evening group.. Patient is complaint with medication. Safety maintained on unit, will continue to monitor.

## 2018-01-20 NOTE — [facility] Group Notes (Signed)
LCSW Group Therapy Note   01/20/2018 1:15pm   Type of Therapy and Topic:  Group Therapy:  Trust and Honesty  Participation Level:  Did Not Attend  Description of Group:    In this group patients will be asked to explore the value of being honest.  Patients will be guided to discuss their thoughts, feelings, and behaviors related to honesty and trusting in others. Patients will process together how trust and honesty relate to forming relationships with peers, family members, and self. Each patient will be challenged to identify and express feelings of being vulnerable. Patients will discuss reasons why people are dishonest and identify alternative outcomes if one was truthful (to self or others). This group will be process-oriented, with patients participating in exploration of their own experiences, giving and receiving support, and processing challenge from other group members.   Therapeutic Goals: 1. Patient will identify why honesty is important to relationships and how honesty overall affects relationships.  2. Patient will identify a situation where they lied or were lied too and the  feelings, thought process, and behaviors surrounding the situation 3. Patient will identify the meaning of being vulnerable, how that feels, and how that correlates to being honest with self and others. 4. Patient will identify situations where they could have told the truth, but instead lied and explain reasons of dishonesty.   Summary of Patient Progress: Pt was invited to attend group but chose not to attend. CSW will continue to encourage pt to attend group throughout their admission.     Therapeutic Modalities:   Cognitive Behavioral Therapy Solution Focused Therapy Motivational Interviewing Brief Therapy  Twyla Dais  CUEBAS-COLON, LCSW 01/20/2018 12:56 PM

## 2018-01-21 ENCOUNTER — Other Ambulatory Visit: Payer: Self-pay | Admitting: Psychiatry

## 2018-01-21 NOTE — Plan of Care (Signed)
Pt reports mood is improving, she was compliant medication, but she still isolates to her room.

## 2018-01-21 NOTE — Plan of Care (Signed)
Data: Patient is appropriate and cooperative to assessment. Patient denies SI/HI/AVH. Patient has completed a daily self inventory worksheet. Patient reports improving mood, and reports that ECT treatment is having a positive effect. Patient has no complaints and a pain rating of 0/10. Patient reports fair sleep quality, appetite is good for the last 24 hours. Patient rates depression "7/10" , feelings of hopelessness "0/10" and anxiety "8/10" Patients did not report a goal today.  Action:  Q x 15 minute observation checks were completed for safety. Patient was provided with education on medications. Patient was offered support and encouragement. Patient was given scheduled medications. Patient  was encourage to attend groups, participate in unit activities and continue with plan of care.     Response: Patient has no complaints at this time. Patient is receptive to treatment and safety maintained on unit.    Problem: Education: Goal: Emotional status will improve Outcome: Progressing Goal: Mental status will improve Outcome: Progressing   Problem: Coping: Goal: Will verbalize feelings Outcome: Progressing   Problem: Safety: Goal: Ability to remain free from injury will improve Outcome: Progressing

## 2018-01-21 NOTE — Progress Notes (Addendum)
[facility] MD Progress Note  01/21/2018 11:01 AM Isabella [name]  MRN:  [id number] Subjective: Patient is still very withdrawn.  Describes herself as better but still stays in bed most of the time little activity.  Denies suicidal ideation. 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 Depression [F32.9] 01/10/2018  . Dehydration [E86.0] 01/10/2018  . Anorexia [R63.0] 01/10/2018  . Schizophrenia ([place]) [F20.9] 12/31/2017  . Social anxiety disorder [F40.10] 08/02/2016  . 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  Suicide attempt.  Past Medical History:  Past Medical History:  Diagnosis Date  . Bipolar disorder ([place])   . Hypertension   . Migraine 07/13/2016  . Schizophrenia ([place])    History reviewed. No pertinent surgical history. Family History:  Family History  Problem Relation Age of Onset  . Mental illness Neg Hx    Family Psychiatric  History: See previous notes Social History:  Social History   Substance and Sexual Activity  Alcohol Use No     Social History   Substance and Sexual Activity  Drug Use No    Social History   Socioeconomic History  . Marital status: Single    Spouse name: Not on file  . Number of children: Not on file  . Years of education: Not on file  . Highest education level: Not on file  Occupational History  . Not on file  Social Needs  . Financial resource strain: Not on file  . Food insecurity:    Worry: Not on file    Inability: Not on file  . Transportation needs:    Medical: Not on file    Non-medical: Not on file  Tobacco Use  . Smoking status: Smoker, Current Status Unknown  . Smokeless tobacco: Never Used  Substance and Sexual Activity  . Alcohol use: No  . Drug use: No  . Sexual activity: Never  Lifestyle  .  Physical activity:    Days per week: Not on file    Minutes per session: Not on file  . Stress: Not on file  Relationships  . Social connections:    Talks on phone: Not on file    Gets together: Not on file    Attends religious service: Not on file    Active member of club or organization: Not on file    Attends meetings of clubs or organizations: Not on file    Relationship status: Not on file  Other Topics Concern  . Not on file  Social History Narrative  . Not on file   Additional Social History:                         Sleep: Fair  Appetite:  Fair  Current Medications: Current Facility-Administered Medications  Medication Dose Route Frequency Provider Last Rate Last Dose  . acetaminophen (TYLENOL) tablet 650 mg  650 mg Oral Q6H PRN Cortavious Nix, Madie Reno, MD   650 mg at 01/18/18 1302  . alum & mag hydroxide-simeth (MAALOX/MYLANTA) 200-200-20 MG/5ML suspension 30 mL  30 mL Oral Q4H PRN Amish Mintzer T, MD      . feeding supplement (ENSURE ENLIVE) (ENSURE ENLIVE) liquid 237 mL  237 mL Oral TID BM Inioluwa Boulay T, MD   237 mL at 01/21/18 0951  . FLUoxetine (PROZAC) capsule 20  mg  20 mg Oral Daily Katelan Hirt, Madie Reno, MD   20 mg at 01/21/18 0809  . hydrOXYzine (ATARAX/VISTARIL) tablet 50 mg  50 mg Oral Q6H PRN Jatia Musa T, MD      . hyoscyamine (ANASPAZ) disintergrating tablet 0.125 mg  0.125 mg Oral Q6H PRN Aeden Matranga T, MD      . magnesium hydroxide (MILK OF MAGNESIA) suspension 30 mL  30 mL Oral Daily PRN Demorio Seeley T, MD      . OLANZapine zydis (ZYPREXA) disintegrating tablet 15 mg  15 mg Oral QHS Sara Selvidge, Madie Reno, MD   15 mg at 01/20/18 2146  . traZODone (DESYREL) tablet 100 mg  100 mg Oral QHS PRN Maijor Hornig, Madie Reno, MD   100 mg at 01/19/18 2330    Lab Results: No results found for this or any previous visit (from the past 48 hour(s)).  Blood Alcohol level:  Lab Results  Component Value Date   ETH <10 12/29/2017   ETH <5 01/60/1093    Metabolic Disorder  Labs: Lab Results  Component Value Date   HGBA1C 5.4 01/31/2016   MPG 108 01/31/2016   Lab Results  Component Value Date   PROLACTIN 124.2 (H) 01/31/2016   Lab Results  Component Value Date   CHOL 152 01/31/2016   TRIG 147 01/31/2016   HDL 39 (L) 01/31/2016   CHOLHDL 3.9 01/31/2016   VLDL 29 01/31/2016   LDLCALC 84 01/31/2016    Physical Findings: AIMS:  , ,  ,  ,    CIWA:    COWS:     Musculoskeletal: Strength & Muscle Tone: within normal limits Gait & Station: normal Patient leans: N/A  Psychiatric Specialty Exam: Physical Exam  Nursing note and vitals reviewed. Constitutional: She appears well-developed and well-nourished.  HENT:  Head: Normocephalic and atraumatic.  Eyes: Pupils are equal, round, and reactive to light. Conjunctivae are normal.  Neck: Normal range of motion.  Cardiovascular: Regular rhythm and normal heart sounds.  Respiratory: Effort normal. No respiratory distress.  GI: Soft.  Musculoskeletal: Normal range of motion.  Neurological: She is alert.  Skin: Skin is warm and dry.  Psychiatric: She has a normal mood and affect. Her behavior is normal. Judgment and thought content normal.    Review of Systems  Constitutional: Negative.   HENT: Negative.   Eyes: Negative.   Respiratory: Negative.   Cardiovascular: Negative.   Gastrointestinal: Negative.   Musculoskeletal: Negative.   Skin: Negative.   Neurological: Negative.   Psychiatric/Behavioral: Negative.     Blood pressure 119/72, pulse (!) 112, temperature 98.7 F (37.1 C), temperature source Oral, resp. rate 18, height 5\' 3"  (1.6 m), weight 83.5 kg, last menstrual period 12/17/2017, SpO2 99 %.Body mass index is 32.59 kg/m.  General Appearance: Casual  Eye Contact:  Good  Speech:  Clear and Coherent  Volume:  Normal  Mood:  Euthymic  Affect:  Constricted  Thought Process:  Goal Directed  Orientation:  Full (Time, Place, and Person)  Thought Content:  Logical  Suicidal Thoughts:   No  Homicidal Thoughts:  No  Memory:  Immediate;   Fair Recent;   Fair Remote;   Fair  Judgement:  Fair  Insight:  Fair  Psychomotor Activity:  Decreased  Concentration:  Concentration: Poor  Recall:  [employer] of Knowledge:  Fair  Language:  Fair  Akathisia:  No  Handed:  Right  AIMS (if indicated):     Assets:  Desire for Improvement Housing Physical  Health  ADL's:  Impaired  Cognition:  Impaired,  Mild  Sleep:  Number of Hours: 7.5     Treatment Plan Summary: Daily contact with patient to assess and evaluate symptoms and progress in treatment, Medication management and Plan Patient remains withdrawn and flat.  Little activity.  Hardly ever gets out of bed.  Encouraged patient again to get up and move around and go to groups.  Praised her improvement in hygiene.  Patient will need to continue ECT treatment in the hospital at this time as there is no outpatient treatment available for her circumstance and she is a remains very impaired and her activity  [name], MD 01/21/2018, 11:01 AM

## 2018-01-21 NOTE — [facility] Group Notes (Signed)
LCSW Group Therapy Note 01/21/2018 1:15pm  Type of Therapy and Topic: Group Therapy: Feelings Around Returning Home & Establishing a Supportive Framework and Supporting Oneself When Supports Not Available  Participation Level: Did Not Attend  Description of Group:  Patients first processed thoughts and feelings about upcoming discharge. These included fears of upcoming changes, lack of change, new living environments, judgements and expectations from others and overall stigma of mental health issues. The group then discussed the definition of a supportive framework, what that looks and feels like, and how do to discern it from an unhealthy non-supportive network. The group identified different types of supports as well as what to do when your family/friends are less than helpful or unavailable  Therapeutic Goals  1. Patient will identify one healthy supportive network that they can use at discharge. 2. Patient will identify one factor of a supportive framework and how to tell it from an unhealthy network. 3. Patient able to identify one coping skill to use when they do not have positive supports from others. 4. Patient will demonstrate ability to communicate their needs through discussion and/or role plays.  Summary of Patient Progress:  Pt was invited to attend group but chose not to attend. CSW will continue to encourage pt to attend group throughout their admission.   Therapeutic Modalities Cognitive Behavioral Therapy Motivational Interviewing   Fredrick Dray  CUEBAS-COLON, LCSW 01/21/2018 11:02 AM

## 2018-01-21 NOTE — Plan of Care (Signed)
Pt. Verbalizes she is doing much better this evening. Pt. Smiling more and affect is brighter. Pt. Still periodically isolative and withdrawn, but this evening, observed in the day room watching tv and participating more. Pt. Noticeably attending to hygiene better and changed into clothing she brought in. Pt. Denies pain. Pt. Engages well during assessments. Pt. Comes up for medications from the day room. Pt. Denies Si/hi and verbally is able to contract for safety.    Problem: Education: Goal: Emotional status will improve Outcome: Progressing Goal: Mental status will improve Outcome: Progressing   Problem: Coping: Goal: Will verbalize feelings Outcome: Progressing   Problem: Safety: Goal: Ability to remain free from injury will improve Outcome: Progressing

## 2018-01-21 NOTE — Progress Notes (Signed)
D: Pt denies SI/HI/AVH, verbally is able to contract for safety. Pt is pleasant and cooperative, engages more during assessments. Pt. has no Complaints.  Patient interaction appropriate with staff and peers. Pt. Comes up for evening medications. Pt. Noticeably attending to hygiene better and changed into clothing she brought in. Pt. Denies pain. Pt. Verbalizes she is doing much better this evening. Pt. Smiling more and affect is brighter. Pt. Still periodically isolative and withdrawn, but this evening, observed in the day room watching tv and participating more. Pt. Anxiety and depression is rated at a, "6/10" for both. Pt. Verbalizes understanding of NPO for ECT. Pt blood sugars monitored per PRE ECT procedures.     A: Q x 15 minute observation checks were completed for safety. Patient was provided with education, but needs reinforcement.  Patient was given/offered medications per orders. Patient  was encourage to attend groups, participate in unit activities and continue with plan of care. Pt. Chart and plans of care reviewed. Pt. Given support and encouragement.   R: Patient is complaint with medication and unit procedures.             Precautionary checks every 15 minutes for safety maintained, room free of safety hazards, patient sustains no injury or falls during this shift. Will endorse care to next shift.

## 2018-01-22 ENCOUNTER — Inpatient Hospital Stay: Payer: Medicaid Other | Admitting: Anesthesiology

## 2018-01-22 ENCOUNTER — Encounter: Payer: Self-pay | Admitting: *Deleted

## 2018-01-22 LAB — GLUCOSE, CAPILLARY: GLUCOSE-CAPILLARY: 103 mg/dL — AB (ref 70–99)

## 2018-01-22 LAB — POCT PREGNANCY, URINE: Preg Test, Ur: NEGATIVE

## 2018-01-22 MED ORDER — SUCCINYLCHOLINE CHLORIDE 200 MG/10ML IV SOSY
PREFILLED_SYRINGE | INTRAVENOUS | Status: DC | PRN
  Administered 2018-01-22: 100 mg via INTRAVENOUS

## 2018-01-22 MED ORDER — SUCCINYLCHOLINE CHLORIDE 20 MG/ML IJ SOLN
INTRAMUSCULAR | Status: AC
  Filled 2018-01-22: qty 1

## 2018-01-22 MED ORDER — METHOHEXITAL SODIUM 100 MG/10ML IV SOSY
PREFILLED_SYRINGE | INTRAVENOUS | Status: DC | PRN
  Administered 2018-01-22: 80 mg via INTRAVENOUS

## 2018-01-22 MED ORDER — SODIUM CHLORIDE 0.9 % IV SOLN
500.0000 mL | Freq: Once | INTRAVENOUS | Status: AC
  Administered 2018-01-22: 500 mL via INTRAVENOUS

## 2018-01-22 MED ORDER — KETOROLAC TROMETHAMINE 30 MG/ML IJ SOLN
INTRAMUSCULAR | Status: AC
  Filled 2018-01-22: qty 1

## 2018-01-22 MED ORDER — SODIUM CHLORIDE 0.9 % IV SOLN
INTRAVENOUS | Status: DC | PRN
  Administered 2018-01-22: 09:00:00 via INTRAVENOUS

## 2018-01-22 MED ORDER — KETOROLAC TROMETHAMINE 30 MG/ML IJ SOLN
30.0000 mg | Freq: Once | INTRAMUSCULAR | Status: AC
  Administered 2018-01-22: 30 mg via INTRAVENOUS

## 2018-01-22 MED ORDER — METHOHEXITAL SODIUM 0.5 G IJ SOLR
INTRAMUSCULAR | Status: AC
  Filled 2018-01-22: qty 500

## 2018-01-22 NOTE — Progress Notes (Signed)
Pt states " she did not sleep well r/t headache". Pt has improved throughout the day. Pt seems brighter. Pt ambulates in halls this afternoon. Pt denies SI/HI as well as auditory and visual hallucinations.   Graceann Congress RN 01/22/18 1858

## 2018-01-22 NOTE — Plan of Care (Signed)
Pt. Denies Si/hi and verbally is able to contract for safety. Pt. Engagement during assessments is much improved. Pt. Is able to give more detail and forwards much more into how she is feeling. Pt. Reports she feels much better then before. Pt. Attending to all hygiene much better (brushing her teeth and showering). Pt. Less lethargic and isolative.    Problem: Education: Goal: Emotional status will improve Outcome: Progressing Goal: Mental status will improve Outcome: Progressing   Problem: Safety: Goal: Ability to remain free from injury will improve Outcome: Progressing

## 2018-01-22 NOTE — Progress Notes (Signed)
Recreation Therapy Notes  Date: 01/22/2018  Time: 9:30 am   Location: Craft room   Behavioral response: N/A   Intervention Topic: Communication  Discussion/Intervention: Patient did not attend group.   Clinical Observations/Feedback:  Patient did not attend group.   [name] LRT/CTRS        Isabella [name] 01/22/2018 10:26 AM

## 2018-01-22 NOTE — Procedures (Signed)
ECT SERVICES Physician's Interval Evaluation & Treatment Note  Patient Identification: Isabella [name] MRN:  [id number] Date of Evaluation:  01/22/2018 TX #: 4  MADRS: 20  MMSE: 26  P.E. Findings:  Physical exam normal.  Vitals stable.  Heart and lungs stable.  Neurologically stable.  Psychiatric Interval Note:  Energy mood and activity improved.  Subjective:  Patient is a 42 y.o. female seen for evaluation for Electroconvulsive Therapy. Patient states she is feeling better but still has low energy and little interactivity  Treatment Summary:   []   Right Unilateral             [x]  Bilateral   % Energy : 1.0 ms 45%   Impedance: 1490 ohms  Seizure Energy Index: 13,774 V squared  Postictal Suppression Index: 79%  Seizure Concordance Index: 98%  Medications  Pre Shock: Robinul 0.1 mg Toradol 30 mg Brevital 80 mg succinylcholine 100 mg  Post Shock:    Seizure Duration: 27 seconds EMG 37 seconds EEG   Comments: Follow-up next treatment inpatient Wednesday  Lungs:  [x]   Clear to auscultation               []  Other:   Heart:    [x]   Regular rhythm             []  irregular rhythm    [x]   Previous H&P reviewed, patient examined and there are NO CHANGES                 []   Previous H&P reviewed, patient examined and there are changes noted.   [name], MD 9/9/201910:18 AM

## 2018-01-22 NOTE — Anesthesia Postprocedure Evaluation (Signed)
Anesthesia Post Note  Patient: Abbegayle Denault  Procedure(s) Performed: ECT TX  Patient location during evaluation: PACU Anesthesia Type: General Level of consciousness: awake and alert Pain management: pain level controlled Vital Signs Assessment: post-procedure vital signs reviewed and stable Respiratory status: spontaneous breathing, nonlabored ventilation, respiratory function stable and patient connected to nasal cannula oxygen Cardiovascular status: blood pressure returned to baseline and stable Postop Assessment: no apparent nausea or vomiting Anesthetic complications: no     Last Vitals:  Vitals:   01/22/18 1104 01/22/18 1107  BP: 129/78 129/78  Pulse: 80 76  Resp: 18   Temp: 36.7 C   SpO2: 99% 98%    Last Pain:  Vitals:   01/22/18 1118  TempSrc:   PainSc: 0-No pain                 Molli Barrows

## 2018-01-22 NOTE — Progress Notes (Signed)
D: Pt denies SI/HI/AVH, verbally is able to contract for safety. Pt is pleasant and cooperative, engages well during assessments. Pt. Got to see family and was very happy about that. Pt. Affect is much brighter this evening. Pt. Reports doing much better this evening. Pt. Denies S/S of ect, denies pain. Pt. has no Complaints.  Patient Interaction with peers and staff appropriate. Pt. Out more in the milieu and snacks, but does periodically isolate still. Pt. Eating good. Pt. Forwards more information during our conversations. Pt. Attending to all hygiene much better (brushing her teeth and showering). Pt. Wearing cloths brought from home.   A: Q x 15 minute observation checks were completed for safety. Patient was provided with education, needs reinforcement.  Patient was given/offered medications per orders. Patient  was encourage to attend groups, participate in unit activities and continue with plan of care. Pt. Chart and plans of care reviewed. Pt. Given support and encouragement.   R: Patient is complaint with medication and unit procedures. Pt. Reports anxiety and depression , "7/10" for both.             Precautionary checks every 15 minutes for safety maintained, room free of safety hazards, patient sustains no injury or falls during this shift. Will endorse care to next shift.

## 2018-01-22 NOTE — Anesthesia Post-op Follow-up Note (Signed)
Anesthesia QCDR form completed.        

## 2018-01-22 NOTE — Anesthesia Preprocedure Evaluation (Signed)
Anesthesia Evaluation  Patient identified by MRN, date of birth, ID band Patient awake    Reviewed: Allergy & Precautions, H&P , NPO status , reviewed documented beta blocker date and time   Airway Mallampati: II  TM Distance: >3 FB Neck ROM: full    Dental  (+) Chipped, Poor Dentition   Pulmonary    Pulmonary exam normal        Cardiovascular hypertension, Normal cardiovascular exam     Neuro/Psych  Headaches, PSYCHIATRIC DISORDERS Anxiety Depression Bipolar Disorder Schizophrenia    GI/Hepatic   Endo/Other    Renal/GU      Musculoskeletal   Abdominal   Peds  Hematology   Anesthesia Other Findings Past Medical History: No date: Bipolar disorder ([place]) No date: Hypertension 07/13/2016: Migraine No date: Schizophrenia [facility])  History reviewed. No pertinent surgical history.  BMI    Body Mass Index:  34.54 kg/m      Reproductive/Obstetrics                             Anesthesia Physical Anesthesia Plan  ASA: III  Anesthesia Plan: General   Post-op Pain Management:    Induction: Intravenous  PONV Risk Score and Plan: Treatment may vary due to age or medical condition and TIVA  Airway Management Planned: Nasal Cannula and Natural Airway  Additional Equipment:   Intra-op Plan:   Post-operative Plan:   Informed Consent: I have reviewed the patients History and Physical, chart, labs and discussed the procedure including the risks, benefits and alternatives for the proposed anesthesia with the patient or authorized representative who has indicated his/her understanding and acceptance.   Dental Advisory Given  Plan Discussed with: CRNA  Anesthesia Plan Comments:         Anesthesia Quick Evaluation

## 2018-01-22 NOTE — Transfer of Care (Signed)
Immediate Anesthesia Transfer of Care Note  Patient: Isabella [name]  Procedure(s) Performed: ECT TX  Patient Location: PACU  Anesthesia Type:General  Level of Consciousness: sedated  Airway & Oxygen Therapy: Patient Spontanous Breathing and Patient connected to face mask oxygen  Post-op Assessment: Report given to RN and Post -op Vital signs reviewed and stable  Post vital signs: Reviewed and stable  Last Vitals:  Vitals Value Taken Time  BP 151/94 01/22/2018 10:37 AM  Temp 37.5 C 01/22/2018 10:37 AM  Pulse 86 01/22/2018 10:38 AM  Resp 16 01/22/2018 10:38 AM  SpO2 96 % 01/22/2018 10:38 AM  Vitals shown include unvalidated device data.  Last Pain:  Vitals:   01/22/18 1001  TempSrc:   PainSc: 0-No pain         Complications: No apparent anesthesia complications

## 2018-01-22 NOTE — Anesthesia Procedure Notes (Signed)
Date/Time: 01/22/2018 10:29 AM Performed by: Dionne Bucy, CRNA Pre-anesthesia Checklist: Patient identified, Emergency Drugs available, Suction available and Patient being monitored Patient Re-evaluated:Patient Re-evaluated prior to induction Oxygen Delivery Method: Circle system utilized Preoxygenation: Pre-oxygenation with 100% oxygen Induction Type: IV induction Ventilation: Mask ventilation without difficulty and Mask ventilation throughout procedure Airway Equipment and Method: Bite block Placement Confirmation: positive ETCO2 Dental Injury: Teeth and Oropharynx as per pre-operative assessment

## 2018-01-22 NOTE — Progress Notes (Signed)
[facility] MD Progress Note  01/22/2018 9:30 PM Isabella [name]  MRN:  [id number] Subjective: Follow-up for this patient with psychotic depression versus schizophrenia.  Patient is showing significant improvement from ECT.  Had ECT again today which was well tolerated.  Patient's mood is improving energy level improving interaction improving.  Denies any suicidal thoughts denies any hallucinations.  Still limited activity rarely gets out of her room.  Health appears to be much better. 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 Depression [F32.9] 01/10/2018  . Dehydration [E86.0] 01/10/2018  . Anorexia [R63.0] 01/10/2018  . Schizophrenia ([place]) [F20.9] 12/31/2017  . Social anxiety disorder [F40.10] 08/02/2016  . 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 Bipolar disorder ([place])   . Hypertension   . Migraine 07/13/2016  . Schizophrenia ([place])    History reviewed. No pertinent surgical history. Family History:  Family History  Problem Relation Age of Onset  . Mental illness Neg Hx    Family Psychiatric  History: See previous note Social History:  Social History   Substance and Sexual Activity  Alcohol Use No     Social History   Substance and Sexual Activity  Drug Use No    Social History   Socioeconomic History  . Marital status: Single    Spouse name: Not on file  . Number of children: Not on file  . Years of education: Not on file  . Highest education level: Not on file  Occupational History  . Not on file  Social Needs  . Financial resource strain: Not on file  . Food insecurity:    Worry: Not on file    Inability: Not on file  . Transportation needs:     Medical: Not on file    Non-medical: Not on file  Tobacco Use  . Smoking status: Smoker, Current Status Unknown  . Smokeless tobacco: Never Used  Substance and Sexual Activity  . Alcohol use: No  . Drug use: No  . Sexual activity: Never  Lifestyle  . Physical activity:    Days per week: Not on file    Minutes per session: Not on file  . Stress: Not on file  Relationships  . Social connections:    Talks on phone: Not on file    Gets together: Not on file    Attends religious service: Not on file    Active member of club or organization: Not on file    Attends meetings of clubs or organizations: Not on file    Relationship status: Not on file  Other Topics Concern  . Not on file  Social History Narrative  . Not on file   Additional Social History:                         Sleep: Fair  Appetite:  Fair  Current Medications: Current Facility-Administered Medications  Medication Dose Route Frequency Provider Last Rate Last Dose  . acetaminophen (TYLENOL) tablet 650 mg  650 mg Oral Q6H PRN Clapacs, Madie Reno, MD   650 mg at 01/22/18 1331  . alum & mag hydroxide-simeth (MAALOX/MYLANTA) 200-200-20 MG/5ML suspension 30 mL  30 mL Oral Q4H PRN Clapacs, Madie Reno, MD      .  feeding supplement (ENSURE ENLIVE) (ENSURE ENLIVE) liquid 237 mL  237 mL Oral TID BM Clapacs, Madie Reno, MD   237 mL at 01/22/18 2024  . FLUoxetine (PROZAC) capsule 20 mg  20 mg Oral Daily Clapacs, John T, MD   20 mg at 01/22/18 1331  . hydrOXYzine (ATARAX/VISTARIL) tablet 50 mg  50 mg Oral Q6H PRN Clapacs, John T, MD      . hyoscyamine (ANASPAZ) disintergrating tablet 0.125 mg  0.125 mg Oral Q6H PRN Clapacs, John T, MD      . ketorolac (TORADOL) 30 MG/ML injection           . magnesium hydroxide (MILK OF MAGNESIA) suspension 30 mL  30 mL Oral Daily PRN Clapacs, John T, MD      . OLANZapine zydis (ZYPREXA) disintegrating tablet 15 mg  15 mg Oral QHS Clapacs, Madie Reno, MD   15 mg at 01/22/18 2125  . traZODone  (DESYREL) tablet 100 mg  100 mg Oral QHS PRN Clapacs, Madie Reno, MD   100 mg at 01/19/18 2330    Lab Results:  Results for orders placed or performed during the hospital encounter of 01/10/18 (from the past 48 hour(s))  Glucose, capillary     Status: Abnormal   Collection Time: 01/22/18  6:00 AM  Result Value Ref Range   Glucose-Capillary 103 (H) 70 - 99 mg/dL  Pregnancy, urine POC     Status: None   Collection Time: 01/22/18  9:48 AM  Result Value Ref Range   Preg Test, Ur NEGATIVE NEGATIVE    Comment:        THE SENSITIVITY OF THIS METHODOLOGY IS >24 mIU/mL     Blood Alcohol level:  Lab Results  Component Value Date   ETH <10 12/29/2017   ETH <5 75/64/3329    Metabolic Disorder Labs: Lab Results  Component Value Date   HGBA1C 5.4 01/31/2016   MPG 108 01/31/2016   Lab Results  Component Value Date   PROLACTIN 124.2 (H) 01/31/2016   Lab Results  Component Value Date   CHOL 152 01/31/2016   TRIG 147 01/31/2016   HDL 39 (L) 01/31/2016   CHOLHDL 3.9 01/31/2016   VLDL 29 01/31/2016   LDLCALC 84 01/31/2016    Physical Findings: AIMS:  , ,  ,  ,    CIWA:    COWS:     Musculoskeletal: Strength & Muscle Tone: within normal limits Gait & Station: normal Patient leans: N/A  Psychiatric Specialty Exam: Physical Exam  Nursing note and vitals reviewed. Constitutional: She appears well-developed and well-nourished.  HENT:  Head: Normocephalic and atraumatic.  Eyes: Pupils are equal, round, and reactive to light. Conjunctivae are normal.  Neck: Normal range of motion.  Cardiovascular: Regular rhythm and normal heart sounds.  Respiratory: Effort normal.  GI: Soft.  Musculoskeletal: Normal range of motion.  Neurological: She is alert.  Skin: Skin is warm and dry.  Psychiatric: Judgment normal. Her affect is blunt. Her speech is delayed. She is slowed. Thought content is not paranoid. Cognition and memory are impaired. She expresses no homicidal and no suicidal  ideation.    Review of Systems  Constitutional: Negative.   HENT: Negative.   Eyes: Negative.   Respiratory: Negative.   Cardiovascular: Negative.   Gastrointestinal: Negative.   Musculoskeletal: Negative.   Skin: Negative.   Neurological: Negative.   Psychiatric/Behavioral: Negative for depression, hallucinations, memory loss, substance abuse and suicidal ideas. The patient is not nervous/anxious and does not have insomnia.  Blood pressure 125/80, pulse 83, temperature 97.6 F (36.4 C), temperature source Oral, resp. rate 18, height 5\' 3"  (1.6 m), weight 88.5 kg, last menstrual period 01/21/2018, SpO2 98 %.Body mass index is 34.54 kg/m.  General Appearance: Casual  Eye Contact:  Fair  Speech:  Slow  Volume:  Decreased  Mood:  Dysphoric  Affect:  Constricted  Thought Process:  Coherent  Orientation:  Full (Time, Place, and Person)  Thought Content:  Logical  Suicidal Thoughts:  No  Homicidal Thoughts:  No  Memory:  Immediate;   Fair Recent;   Fair Remote;   Fair  Judgement:  Impaired  Insight:  Shallow  Psychomotor Activity:  Decreased  Concentration:  Concentration: Fair  Recall:  [employer] of Knowledge:  Fair  Language:  Fair  Akathisia:  No  Handed:  Right  AIMS (if indicated):     Assets:  Desire for Improvement Housing Physical Health  ADL's:  Impaired  Cognition:  Impaired,  Mild  Sleep:  Number of Hours: 8     Treatment Plan Summary: Daily contact with patient to assess and evaluate symptoms and progress in treatment, Medication management and Plan Mood is improving greatly activity improving greatly.  Also having significant cognitive impairment as documented by Mini-Mental status exam.  Trying to encourage patient to get up to get out of bed participate more and be more physically active.  For now continue inpatient ECT treatment as long as we continue to get improvement.  [name], MD 01/22/2018, 9:30 PM

## 2018-01-22 NOTE — H&P (Signed)
Isabella [name] is an 42 y.o. female.   Chief Complaint: Patient reports that she is feeling better.  No specific complaint HPI: History of recurrent severe depression or schizophrenia currently showing early response to ECT  Past Medical History:  Diagnosis Date  . Bipolar disorder ([place])   . Hypertension   . Migraine 07/13/2016  . Schizophrenia ([place])     History reviewed. No pertinent surgical history.  Family History  Problem Relation Age of Onset  . Mental illness Neg Hx    Social History:  reports that she has been smoking. She has never used smokeless tobacco. She reports that she does not drink alcohol or use drugs.  Allergies: No Known Allergies  Medications Prior to Admission  Medication Sig Dispense Refill  . acetaminophen (TYLENOL) 325 MG tablet Take 2 tablets (650 mg total) by mouth every 6 (six) hours as needed for mild pain.    Marland Kitchen alum & mag hydroxide-simeth (MAALOX/MYLANTA) 200-200-20 MG/5ML suspension Take 30 mLs by mouth every 4 (four) hours as needed for indigestion. 355 mL 0  . ARIPiprazole (ABILIFY) 20 MG tablet Take 1 tablet (20 mg total) by mouth daily. For mood control    . escitalopram (LEXAPRO) 5 MG tablet Take 3 tablets (15 mg total) by mouth daily. For depression    . feeding supplement, ENSURE ENLIVE, (ENSURE ENLIVE) LIQD Take 237 mLs by mouth 2 (two) times daily between meals. For nutritional supplementation 237 mL 12  . hydrOXYzine (ATARAX/VISTARIL) 50 MG tablet Take 1 tablet (50 mg total) by mouth every 6 (six) hours as needed for anxiety. 1 tablet 0  . hyoscyamine (ANASPAZ) 0.125 MG TBDP disintergrating tablet Take 1 tablet (0.125 mg total) by mouth every 4 (four) hours as needed for cramping.  0  . magnesium hydroxide (MILK OF MAGNESIA) 400 MG/5ML suspension Take 30 mLs by mouth daily as needed for mild constipation. 360 mL 0  . OLANZapine zydis (ZYPREXA) 5 MG disintegrating tablet Take 1 tablet (5 mg total) by mouth every 8 (eight) hours as  needed (agitation/psychosis).    . traZODone (DESYREL) 100 MG tablet Take 1 tablet (100 mg total) by mouth at bedtime as needed for sleep.      Results for orders placed or performed during the hospital encounter of 01/10/18 (from the past 48 hour(s))  Glucose, capillary     Status: Abnormal   Collection Time: 01/22/18  6:00 AM  Result Value Ref Range   Glucose-Capillary 103 (H) 70 - 99 mg/dL   No results found.  Review of Systems  Constitutional: Negative.   HENT: Negative.   Eyes: Negative.   Respiratory: Negative.   Cardiovascular: Negative.   Gastrointestinal: Negative.   Musculoskeletal: Negative.   Skin: Negative.   Neurological: Negative.   Psychiatric/Behavioral: Negative.     Blood pressure 122/72, pulse 79, temperature (!) 97.5 F (36.4 C), temperature source Oral, resp. rate 18, height 5\' 3"  (1.6 m), weight 88.5 kg, last menstrual period 01/21/2018, SpO2 (!) 9 %. Physical Exam  Nursing note and vitals reviewed. Constitutional: She appears well-developed and well-nourished.  HENT:  Head: Normocephalic and atraumatic.  Eyes: Pupils are equal, round, and reactive to light. Conjunctivae are normal.  Neck: Normal range of motion.  Cardiovascular: Regular rhythm and normal heart sounds.  Respiratory: Effort normal. No respiratory distress.  GI: Soft.  Musculoskeletal: Normal range of motion.  Neurological: She is alert.  Skin: Skin is warm and dry.  Psychiatric: Judgment normal. Her affect is blunt. Her speech is  delayed. She is withdrawn. Thought content is not paranoid. She expresses no homicidal and no suicidal ideation. She exhibits abnormal recent memory.     Assessment/Plan Continue bilateral treatment and inpatient treatment  [name], MD 01/22/2018, 10:17 AM

## 2018-01-22 NOTE — Tx Team (Signed)
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 acetaminophen (TYLENOL) tablet 650 mg  650 mg Oral Q6H PRN Clapacs, Madie Reno, MD   650 mg at 01/18/18 1302  . alum & mag hydroxide-simeth (MAALOX/MYLANTA) 200-200-20 MG/5ML suspension 30 mL  30 mL Oral Q4H PRN Clapacs, John T, MD      . feeding supplement (ENSURE ENLIVE) (ENSURE ENLIVE) liquid 237 mL  237 mL Oral TID BM Clapacs, John T, MD   237 mL at 01/21/18 1956  . FLUoxetine (PROZAC) capsule 20 mg  20 mg Oral Daily Clapacs, Madie Reno, MD   20 mg at 01/21/18 0809  . hydrOXYzine (ATARAX/VISTARIL) tablet 50 mg  50 mg Oral Q6H PRN Clapacs, John T, MD      . hyoscyamine (ANASPAZ) disintergrating tablet 0.125 mg  0.125 mg Oral Q6H PRN Clapacs, John T, MD      . ketorolac (TORADOL) 30 MG/ML injection           . magnesium hydroxide (MILK OF MAGNESIA) suspension 30 mL  30 mL Oral Daily PRN Clapacs, John T, MD      . OLANZapine zydis (ZYPREXA) disintegrating tablet 15 mg  15 mg Oral QHS Clapacs, Madie Reno, MD   15 mg at 01/21/18 2134  . traZODone (DESYREL) tablet 100 mg  100 mg Oral QHS PRN Clapacs, Madie Reno, MD   100 mg at 01/19/18 2330   PTA Medications: Medications Prior to Admission  Medication Sig Dispense Refill Last Dose  . acetaminophen (TYLENOL) 325 MG tablet Take 2 tablets (650 mg total) by mouth every 6 (six) hours as needed for mild pain.     Marland Kitchen alum & mag hydroxide-simeth (MAALOX/MYLANTA) 200-200-20 MG/5ML suspension Take 30 mLs by mouth every 4 (four) hours as needed for indigestion. 355 mL 0   . ARIPiprazole  (ABILIFY) 20 MG tablet Take 1 tablet (20 mg total) by mouth daily. For mood control     . escitalopram (LEXAPRO) 5 MG tablet Take 3 tablets (15 mg total) by mouth daily. For depression     . feeding supplement, ENSURE ENLIVE, (ENSURE ENLIVE) LIQD Take 237 mLs by mouth 2 (two) times daily between meals. For nutritional supplementation 237 mL 12   . hydrOXYzine (ATARAX/VISTARIL) 50 MG tablet Take 1 tablet (50 mg total) by mouth every 6 (six) hours as needed for anxiety. 1 tablet 0   . hyoscyamine (ANASPAZ) 0.125 MG TBDP disintergrating tablet Take 1 tablet (0.125 mg total) by mouth every 4 (four) hours as needed for cramping.  0   . magnesium hydroxide (MILK OF MAGNESIA) 400 MG/5ML suspension Take 30 mLs by mouth daily as needed for mild constipation. 360 mL 0   . OLANZapine zydis (ZYPREXA) 5 MG disintegrating tablet Take 1 tablet (5 mg total) by mouth every 8 (eight) hours as needed (agitation/psychosis).     . traZODone (DESYREL) 100 MG tablet Take 1 tablet (100 mg total) by mouth at bedtime as needed for sleep.       Patient Stressors: Health problems Medication change or noncompliance  Patient Strengths: Ability for insight Capable of independent living Physical Health Supportive family/friends  Treatment Modalities:  Medication Management, Group therapy, Case management,  1 to 1 session with clinician, Psychoeducation, Recreational therapy.   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  Therapeutic Interventions: 1 to 1 sessions, Unit Group sessions and Medication administration.  Evaluation of Outcomes: Progressing   9/9: Patient remains withdrawn and flat.  Little activity.  Hardly ever gets out of bed.  Encouraged patient again to get up and move around and go to groups.  Praised her improvement in hygiene.  Patient will need to continue ECT treatment Noticeably attending to hygiene better and changed into clothing she brought in.  Verbalizes she is doing much better this evening. Pt. Smiling more and affect is brighter. Pt. 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  Therapeutic Interventions: 1 to 1 sessions, Unit Group sessions and Medication administration.  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 RN will report any adverse and/or side effects to prescribing provider.  Therapeutic Interventions: 1 on 1 counseling sessions, Psychoeducation, Medication administration, Evaluate responses to treatment, Monitor vital signs and CBGs as ordered, Perform/monitor CIWA, COWS, AIMS and Fall Risk screenings as ordered, Perform wound care treatments as ordered.  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  Short Term Goals: Engage patient in aftercare planning with referrals and resources, Increase social support, Increase ability to appropriately verbalize feelings, Identify triggers associated with mental health/substance abuse issues and Increase skills for wellness and recovery  Therapeutic Interventions: Assess for all discharge needs, 1 to 1 time with Social worker, Explore available resources and support systems, Assess for adequacy in community support network, Educate family and significant other(s) on suicide prevention, Complete Psychosocial Assessment, Interpersonal group therapy.  Evaluation of Outcomes: Progressing   Progress in Treatment: Attending groups: No. Participating in  groups: No. Taking medication as prescribed: Yes. Toleration medication: Yes. Family/Significant other contact made: Yes, individual(s) contacted:  Patients daughter-in-law Patient understands diagnosis: Yes. Discussing patient identified problems/goals with staff: Yes. Medical problems stabilized or resolved: Yes. Denies suicidal/homicidal ideation: Yes. Issues/concerns per patient  self-inventory: No. Other:  New problem(s) identified: No, Describe:  None  New Short Term/Long Term Goal(s):  To feel better  Patient Goals:  To feel better  Discharge Plan or Barriers: To return home and follow up with outpatient treatment.  Reason for Continuation of Hospitalization: Depression Medication stabilization  Estimated Length of Stay: 9/13  Patient Stressors: N/A Patient Goal: Patient will engage in groups without prompting or encouragement from LRT x3 group sessions within 5 recreation therapy group sessions  Attendees: Patient:  01/22/2018 9:59 AM  Physician: [name], MD 01/22/2018 9:59 AM  Nursing:  01/22/2018 9:59 AM  RN Care Manager: 01/22/2018 9:59 AM  Social Worker: Roque Lias LCSW 01/22/2018 9:59 AM  Recreational Therapist:  01/22/2018 9:59 AM  Other:  01/22/2018 9:59 AM  Other:  01/22/2018 9:59 AM  Other: 01/22/2018 9:59 AM    Scribe for Treatment Team: Trish Mage, LCSW 01/22/2018 9:59 AM

## 2018-01-23 NOTE — BHH Group Notes (Signed)
[facility] Group Notes:  (Nursing/MHT/Case Management/Adjunct)  Date:  01/23/2018  Time:  10:09 PM  Type of Therapy:  Group Therapy  Participation Level:  Active  Participation Quality:  Sharing  Affect:  Flat  Cognitive:  Appropriate  Insight:  Appropriate  Engagement in Group:  Engaged  Modes of Intervention:  Discussion  Summary of Progress/Problems: Destony attended group on this day. Malillany stated she was ready to go home. This is the first group with Probation officer that [place] shared with group. Ashaki completed her self inventory sheet. Jada reports sleeping good and having a good appetite. Reports concentration is good. Denies harmful ideation to self or others. Reports high depression and hopelessness. No physical problems reported. Deissy reports she is ready to go home. MHT reviewed rules and expectations of the unit 1. Visitation hours 2. Phone hours 3. No sharing clothes 4. No use of last names, first names only 5. No visiting other patients in rooms 6. Only allowed on hall room is on 7. Cover self appropriately because MHT's will be looking in to check on at night MHT processed with group about after care plans. MHT encouraged patients to attend follow up appointments and take medications as prescribed. MHT informed of resource board with services to address mental health, housing, and substance abuse. MHT encouraged patients to talk with prescribing physicians on the effectiveness of their medications. Barnie Mort 01/23/2018, 10:09 PM

## 2018-01-23 NOTE — Progress Notes (Signed)
[facility] MD Progress Note  01/23/2018 6:53 PM Isabella [name]  MRN:  [id number] Subjective: Follow-up for this patient with catatonic schizophrenia versus psychotic depression.  On interview this evening the patient is dramatically improved from the last time I saw her.  Finally she is up out of bed standing up walking around her room.  She is smiling and speaking spontaneously.  Speaking in full sentences.  Patient remembers who I am remembers the treatment she has been getting.  Denies having any hallucinations or suicidal thoughts.  Still mostly focused on going home she asks for discharge but is not argumentative.  No apparent side effects right now. 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 Depression [F32.9] 01/10/2018  . Dehydration [E86.0] 01/10/2018  . Anorexia [R63.0] 01/10/2018  . Schizophrenia ([place]) [F20.9] 12/31/2017  . Social anxiety disorder [F40.10] 08/02/2016  . 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  Past Medical History:  Past Medical History:  Diagnosis Date  . Bipolar disorder ([place])   . Hypertension   . Migraine 07/13/2016  . Schizophrenia ([place])    History reviewed. No pertinent surgical history. Family History:  Family History  Problem Relation Age of Onset  . Mental illness Neg Hx    Family Psychiatric  History: See previous note Social History:  Social History   Substance and Sexual Activity  Alcohol Use No     Social History   Substance and Sexual Activity  Drug Use No    Social History   Socioeconomic History  . Marital status: Single    Spouse name: Not on file  . Number of children: Not on file  . Years of education: Not on file  . Highest education level: Not on file   Occupational History  . Not on file  Social Needs  . Financial resource strain: Not on file  . Food insecurity:    Worry: Not on file    Inability: Not on file  . Transportation needs:    Medical: Not on file    Non-medical: Not on file  Tobacco Use  . Smoking status: Smoker, Current Status Unknown  . Smokeless tobacco: Never Used  Substance and Sexual Activity  . Alcohol use: No  . Drug use: No  . Sexual activity: Never  Lifestyle  . Physical activity:    Days per week: Not on file    Minutes per session: Not on file  . Stress: Not on file  Relationships  . Social connections:    Talks on phone: Not on file    Gets together: Not on file    Attends religious service: Not on file    Active member of club or organization: Not on file    Attends meetings of clubs or organizations: Not on file    Relationship status: Not on file  Other Topics Concern  . Not on file  Social History Narrative  . Not on file   Additional Social History:                         Sleep: Good  Appetite:  Good  Current Medications: Current Facility-Administered Medications  Medication Dose Route Frequency Provider Last Rate Last Dose  . acetaminophen (TYLENOL) tablet 650 mg  650  mg Oral Q6H PRN Clapacs, Madie Reno, MD   650 mg at 01/22/18 1331  . alum & mag hydroxide-simeth (MAALOX/MYLANTA) 200-200-20 MG/5ML suspension 30 mL  30 mL Oral Q4H PRN Clapacs, John T, MD      . feeding supplement (ENSURE ENLIVE) (ENSURE ENLIVE) liquid 237 mL  237 mL Oral TID BM Clapacs, John T, MD   237 mL at 01/23/18 1409  . FLUoxetine (PROZAC) capsule 20 mg  20 mg Oral Daily Clapacs, John T, MD   20 mg at 01/23/18 0935  . hydrOXYzine (ATARAX/VISTARIL) tablet 50 mg  50 mg Oral Q6H PRN Clapacs, John T, MD      . hyoscyamine (ANASPAZ) disintergrating tablet 0.125 mg  0.125 mg Oral Q6H PRN Clapacs, John T, MD      . magnesium hydroxide (MILK OF MAGNESIA) suspension 30 mL  30 mL Oral Daily PRN Clapacs, John T, MD       . OLANZapine zydis (ZYPREXA) disintegrating tablet 15 mg  15 mg Oral QHS Clapacs, Madie Reno, MD   15 mg at 01/22/18 2125  . traZODone (DESYREL) tablet 100 mg  100 mg Oral QHS PRN Clapacs, Madie Reno, MD   100 mg at 01/19/18 2330    Lab Results:  Results for orders placed or performed during the hospital encounter of 01/10/18 (from the past 48 hour(s))  Glucose, capillary     Status: Abnormal   Collection Time: 01/22/18  6:00 AM  Result Value Ref Range   Glucose-Capillary 103 (H) 70 - 99 mg/dL  Pregnancy, urine POC     Status: None   Collection Time: 01/22/18  9:48 AM  Result Value Ref Range   Preg Test, Ur NEGATIVE NEGATIVE    Comment:        THE SENSITIVITY OF THIS METHODOLOGY IS >24 mIU/mL     Blood Alcohol level:  Lab Results  Component Value Date   ETH <10 12/29/2017   ETH <5 09/62/8366    Metabolic Disorder Labs: Lab Results  Component Value Date   HGBA1C 5.4 01/31/2016   MPG 108 01/31/2016   Lab Results  Component Value Date   PROLACTIN 124.2 (H) 01/31/2016   Lab Results  Component Value Date   CHOL 152 01/31/2016   TRIG 147 01/31/2016   HDL 39 (L) 01/31/2016   CHOLHDL 3.9 01/31/2016   VLDL 29 01/31/2016   LDLCALC 84 01/31/2016    Physical Findings: AIMS:  , ,  ,  ,    CIWA:    COWS:     Musculoskeletal: Strength & Muscle Tone: within normal limits Gait & Station: normal Patient leans: N/A  Psychiatric Specialty Exam: Physical Exam  Nursing note and vitals reviewed. Constitutional: She appears well-developed and well-nourished.  HENT:  Head: Normocephalic and atraumatic.  Eyes: Pupils are equal, round, and reactive to light. Conjunctivae are normal.  Neck: Normal range of motion.  Cardiovascular: Regular rhythm and normal heart sounds.  Respiratory: Effort normal. No respiratory distress.  GI: Soft.  Musculoskeletal: Normal range of motion.  Neurological: She is alert.  Skin: Skin is warm and dry.  Psychiatric: She has a normal mood and  affect. Her speech is normal and behavior is normal. Judgment and thought content normal. She exhibits abnormal recent memory.    Review of Systems  Constitutional: Negative.   HENT: Negative.   Eyes: Negative.   Respiratory: Negative.   Cardiovascular: Negative.   Gastrointestinal: Negative.   Musculoskeletal: Negative.   Skin: Negative.   Neurological: Negative.  Psychiatric/Behavioral: Positive for memory loss. Negative for depression, hallucinations, substance abuse and suicidal ideas. The patient is not nervous/anxious and does not have insomnia.     Blood pressure 129/85, pulse (!) 107, temperature 98.7 F (37.1 C), temperature source Oral, resp. rate 16, height 5\' 3"  (1.6 m), weight 88.5 kg, last menstrual period 01/21/2018, SpO2 97 %.Body mass index is 34.54 kg/m.  General Appearance: Fairly Groomed  Eye Contact:  Good  Speech:  Clear and Coherent  Volume:  Decreased  Mood:  Euthymic  Affect:  Congruent  Thought Process:  Goal Directed  Orientation:  Full (Time, Place, and Person)  Thought Content:  Logical  Suicidal Thoughts:  No  Homicidal Thoughts:  No  Memory:  Immediate;   Fair Recent;   Fair Remote;   Fair  Judgement:  Fair  Insight:  Fair  Psychomotor Activity:  Normal  Concentration:  Concentration: Fair  Recall:  [employer] of Knowledge:  Fair  Language:  Fair  Akathisia:  No  Handed:  Right  AIMS (if indicated):     Assets:  Desire for Improvement Housing Physical Health Resilience  ADL's:  Impaired  Cognition:  Impaired,  Mild  Sleep:  Number of Hours: 8     Treatment Plan Summary: Daily contact with patient to assess and evaluate symptoms and progress in treatment, Medication management and Plan Patient has shown great improvement.  Still unclear whether she has plateaued.  Explained the plan to the patient and that we will continue to ECT with at least one more treatment tomorrow before considering discharge planning.  Patient appears to be  doing well with minimal side effects no change to medicine today.  Case reviewed with nursing and social work as well.  [name], MD 01/23/2018, 6:53 PM

## 2018-01-23 NOTE — Progress Notes (Signed)
Recreation Therapy Notes  Date: 01/22/2018  Time: 9:30 pm   Location: Craft Room   Behavioral response: N/A   Intervention Topic: Problem Solving  Discussion/Intervention: Patient did not attend group.   Clinical Observations/Feedback:  Patient did not attend group.   Addasyn Mcbreen LRT/CTRS        Xochilt Conant 01/23/2018 11:26 AM

## 2018-01-23 NOTE — Progress Notes (Signed)
D: Patient stated slept good last night .Stated appetite is good and energy level  Is normal. Stated concentration is good . Stated on Depression scale 5 , hopeless  0 and anxiety 5 .( low 0-10 high) Denies suicidal  homicidal ideations  .  No auditory hallucinations  No pain concerns . Appropriate ADL'S. Limited  Interacting with peers and staff. Patient improving  with affect , appetite. . Noted to interact with peers on unit . Emotional and mental status improved . Noted to speak english , verbalized understanding . Coping ability improved. Verbalized feeling  to Probation officer. Voice of no safety concerns . A: Encourage patient participation with unit programming . Instruction  Given on  Medication , verbalize understanding. R: Voice no other concerns. Staff continue to monitor

## 2018-01-23 NOTE — Plan of Care (Signed)
Pt. Denies Si/hi and contracts for safety. Pt. Verbalizes feeling and engages thoughtfully during assessments, but reports higher depression and anxiety despite reportedly doing much better.    Problem: Education: Goal: Emotional status will improve Outcome: Progressing Goal: Mental status will improve Outcome: Progressing   Problem: Coping: Goal: Will verbalize feelings Outcome: Progressing   Problem: Safety: Goal: Ability to remain free from injury will improve Outcome: Progressing

## 2018-01-23 NOTE — [facility] Group Notes (Signed)
01/23/2018 1PM  Type of Therapy/Topic:  Group Therapy:  Feelings about Diagnosis  Participation Level:  Did Not Attend   Description of Group:   This group will allow patients to explore their thoughts and feelings about diagnoses they have received. Patients will be guided to explore their level of understanding and acceptance of these diagnoses. Facilitator will encourage patients to process their thoughts and feelings about the reactions of others to their diagnosis and will guide patients in identifying ways to discuss their diagnosis with significant others in their lives. This group will be process-oriented, with patients participating in exploration of their own experiences, giving and receiving support, and processing challenge from other group members.   Therapeutic Goals: 1. Patient will demonstrate understanding of diagnosis as evidenced by identifying two or more symptoms of the disorder 2. Patient will be able to express two feelings regarding the diagnosis 3. Patient will demonstrate their ability to communicate their needs through discussion and/or role play  Summary of Patient Progress: Patient was encouraged and invited to attend group. Patient did not attend group. Social worker will continue to encourage group participation in the future.        Therapeutic Modalities:   Cognitive Behavioral Therapy Brief Therapy Feelings Identification    Darin Engels, [place] 01/23/2018 2:44 PM

## 2018-01-23 NOTE — Progress Notes (Signed)
D: Pt denies SI/HI/AVH, contracts verbally for safety. Pt is pleasant and cooperative. Pt. has no Complaints.  Patient Interaction appropriate. Pt. More active in the milieu, less isolative overall. Pt. Depression and anxiety high, "8/10", but insists she is doing much better.   A: Q x 15 minute observation checks were completed for safety. Patient was provided with education, needs reinforcement.  Patient was given/offered medications per orders. Patient  was encourage to attend groups, participate in unit activities and continue with plan of care. Pt. Chart and plans of care reviewed. Pt. Given support and encouragement.   R: Patient is complaint with medication and unit procedures. Pt. Verbalizes understanding of NPO for ECT. Pt. Blood sugars monitored for ECT.              Precautionary checks every 15 minutes for safety maintained, room free of safety hazards, patient sustains no injury or falls during this shift. Will endorse care to next shift.

## 2018-01-23 NOTE — Anesthesia Postprocedure Evaluation (Signed)
Anesthesia Post Note  Patient: Isabella [name]  Procedure(s) Performed: ECT TX  Patient location during evaluation: PACU Anesthesia Type: General Level of consciousness: awake and alert Pain management: pain level controlled Vital Signs Assessment: post-procedure vital signs reviewed and stable Respiratory status: spontaneous breathing, nonlabored ventilation and respiratory function stable Cardiovascular status: blood pressure returned to baseline and stable Postop Assessment: no apparent nausea or vomiting Anesthetic complications: no     Last Vitals:  Vitals:   01/23/18 0616 01/23/18 0618  BP: 114/83 129/85  Pulse: 81 (!) 107  Resp: 16   Temp: 37.1 C   SpO2: 98% 97%    Last Pain:  Vitals:   01/23/18 0616  TempSrc: Oral  PainSc:                  Alphonsus Sias

## 2018-01-23 NOTE — Plan of Care (Signed)
Patient improving  with affect , appetite. . Noted to interact with peers on unit . Emotional and mental status improved . Noted to speak english , verbalized understanding . Coping ability improved. Verbalized feeling  to Probation officer. Voice of no safety concerns .  Problem: Education: Goal: Knowledge of [place] [facility] Education information/materials will improve Outcome: Progressing Goal: Emotional status will improve Outcome: Progressing Goal: Mental status will improve Outcome: Progressing Goal: Verbalization of understanding the information provided will improve Outcome: Progressing   Problem: Coping: Goal: Coping ability will improve Outcome: Progressing Goal: Will verbalize feelings Outcome: Progressing   Problem: Safety: Goal: Ability to remain free from injury will improve Outcome: Progressing

## 2018-01-24 ENCOUNTER — Other Ambulatory Visit: Payer: Self-pay | Admitting: Psychiatry

## 2018-01-24 ENCOUNTER — Inpatient Hospital Stay: Payer: Medicaid Other | Admitting: Anesthesiology

## 2018-01-24 LAB — GLUCOSE, CAPILLARY: GLUCOSE-CAPILLARY: 88 mg/dL (ref 70–99)

## 2018-01-24 MED ORDER — LABETALOL HCL 5 MG/ML IV SOLN
INTRAVENOUS | Status: DC | PRN
  Administered 2018-01-24: 10 mg via INTRAVENOUS

## 2018-01-24 MED ORDER — METHOHEXITAL SODIUM 100 MG/10ML IV SOSY
PREFILLED_SYRINGE | INTRAVENOUS | Status: DC | PRN
  Administered 2018-01-24: 80 mg via INTRAVENOUS

## 2018-01-24 MED ORDER — SUCCINYLCHOLINE CHLORIDE 20 MG/ML IJ SOLN
INTRAMUSCULAR | Status: AC
  Filled 2018-01-24: qty 1

## 2018-01-24 MED ORDER — SODIUM CHLORIDE 0.9 % IV SOLN
500.0000 mL | Freq: Once | INTRAVENOUS | Status: AC
  Administered 2018-01-24: 500 mL via INTRAVENOUS

## 2018-01-24 MED ORDER — SODIUM CHLORIDE 0.9 % IV SOLN
INTRAVENOUS | Status: DC | PRN
  Administered 2018-01-24 (×2): via INTRAVENOUS

## 2018-01-24 MED ORDER — GLYCOPYRROLATE 0.2 MG/ML IJ SOLN
0.2000 mg | Freq: Once | INTRAMUSCULAR | Status: AC
  Administered 2018-01-24: 0.2 mg via INTRAVENOUS

## 2018-01-24 MED ORDER — KETOROLAC TROMETHAMINE 30 MG/ML IJ SOLN
30.0000 mg | Freq: Once | INTRAMUSCULAR | Status: AC
  Administered 2018-01-24: 30 mg via INTRAVENOUS

## 2018-01-24 MED ORDER — LABETALOL HCL 5 MG/ML IV SOLN
INTRAVENOUS | Status: AC
  Filled 2018-01-24: qty 4

## 2018-01-24 MED ORDER — SUCCINYLCHOLINE CHLORIDE 200 MG/10ML IV SOSY
PREFILLED_SYRINGE | INTRAVENOUS | Status: DC | PRN
  Administered 2018-01-24: 100 mg via INTRAVENOUS

## 2018-01-24 NOTE — H&P (Signed)
Isabella [name] is an 42 y.o. female.   Chief Complaint: No new complaint HPI: Catatonic  Past Medical History:  Diagnosis Date  . Bipolar disorder ([place])   . Hypertension   . Migraine 07/13/2016  . Schizophrenia ([place])     History reviewed. No pertinent surgical history.  Family History  Problem Relation Age of Onset  . Mental illness Neg Hx    Social History:  reports that she has been smoking. She has never used smokeless tobacco. She reports that she does not drink alcohol or use drugs.  Allergies: No Known Allergies  Medications Prior to Admission  Medication Sig Dispense Refill  . acetaminophen (TYLENOL) 325 MG tablet Take 2 tablets (650 mg total) by mouth every 6 (six) hours as needed for mild pain.    Marland Kitchen alum & mag hydroxide-simeth (MAALOX/MYLANTA) 200-200-20 MG/5ML suspension Take 30 mLs by mouth every 4 (four) hours as needed for indigestion. 355 mL 0  . ARIPiprazole (ABILIFY) 20 MG tablet Take 1 tablet (20 mg total) by mouth daily. For mood control    . escitalopram (LEXAPRO) 5 MG tablet Take 3 tablets (15 mg total) by mouth daily. For depression    . feeding supplement, ENSURE ENLIVE, (ENSURE ENLIVE) LIQD Take 237 mLs by mouth 2 (two) times daily between meals. For nutritional supplementation 237 mL 12  . hydrOXYzine (ATARAX/VISTARIL) 50 MG tablet Take 1 tablet (50 mg total) by mouth every 6 (six) hours as needed for anxiety. 1 tablet 0  . hyoscyamine (ANASPAZ) 0.125 MG TBDP disintergrating tablet Take 1 tablet (0.125 mg total) by mouth every 4 (four) hours as needed for cramping.  0  . magnesium hydroxide (MILK OF MAGNESIA) 400 MG/5ML suspension Take 30 mLs by mouth daily as needed for mild constipation. 360 mL 0  . OLANZapine zydis (ZYPREXA) 5 MG disintegrating tablet Take 1 tablet (5 mg total) by mouth every 8 (eight) hours as needed (agitation/psychosis).    . traZODone (DESYREL) 100 MG tablet Take 1 tablet (100 mg total) by mouth at bedtime as needed for sleep.       Results for orders placed or performed during the hospital encounter of 01/10/18 (from the past 48 hour(s))  Glucose, capillary     Status: None   Collection Time: 01/24/18  7:13 AM  Result Value Ref Range   Glucose-Capillary 88 70 - 99 mg/dL   Comment 1 Notify RN    No results found.  Review of Systems  Constitutional: Negative.   HENT: Negative.   Eyes: Negative.   Respiratory: Negative.   Cardiovascular: Negative.   Gastrointestinal: Negative.   Musculoskeletal: Negative.   Skin: Negative.   Neurological: Negative.   Psychiatric/Behavioral: Negative.     Blood pressure 108/66, pulse 78, temperature (!) 97 F (36.1 C), temperature source Oral, resp. rate 18, height 5\' 3"  (1.6 m), weight 89.4 kg, last menstrual period 01/21/2018, SpO2 99 %. Physical Exam  Nursing note and vitals reviewed. Constitutional: She appears well-developed and well-nourished.  HENT:  Head: Normocephalic and atraumatic.  Eyes: Pupils are equal, round, and reactive to light. Conjunctivae are normal.  Neck: Normal range of motion.  Cardiovascular: Regular rhythm and normal heart sounds.  Respiratory: Effort normal. No respiratory distress.  GI: Soft.  Musculoskeletal: Normal range of motion.  Neurological: She is alert.  Skin: Skin is warm and dry.  Psychiatric: She has a normal mood and affect. Her behavior is normal. Judgment and thought content normal.     Assessment/Plan Improving  Jenny Reichmann  Kwana Ringel, MD 01/24/2018, 11:53 AM

## 2018-01-24 NOTE — Anesthesia Post-op Follow-up Note (Signed)
Anesthesia QCDR form completed.        

## 2018-01-24 NOTE — Plan of Care (Signed)
Appetite improved. . Noted to interact with peers on unit . Emotional and mental status improved . Noted to speak english , verbalized understanding . Coping ability improved. Verbalized feeling  to Probation officer. Voice of no safety concerns .Patient improving  with affect  Problem: Education: Goal: Knowledge of [place] [facility] Education information/materials will improve Outcome: Progressing Goal: Emotional status will improve Outcome: Progressing Goal: Mental status will improve Outcome: Progressing Goal: Verbalization of understanding the information provided will improve Outcome: Progressing   Problem: Coping: Goal: Coping ability will improve Outcome: Progressing Goal: Will verbalize feelings Outcome: Progressing   Problem: Safety: Goal: Ability to remain free from injury will improve Outcome: Progressing

## 2018-01-24 NOTE — Transfer of Care (Signed)
Immediate Anesthesia Transfer of Care Note  Patient: Isabella [name]  Procedure(s) Performed: ECT TX  Patient Location: PACU  Anesthesia Type:General  Level of Consciousness: sedated  Airway & Oxygen Therapy: Patient Spontanous Breathing and Patient connected to face mask oxygen  Post-op Assessment: Report given to RN and Post -op Vital signs reviewed and stable  Post vital signs: Reviewed and stable  Last Vitals:  Vitals Value Taken Time  BP 135/94 01/24/2018 12:18 PM  Temp    Pulse 146 01/24/2018 12:18 PM  Resp 15 01/24/2018 12:18 PM  SpO2 88 % 01/24/2018 12:18 PM  Vitals shown include unvalidated device data.  Last Pain:  Vitals:   01/24/18 0939  TempSrc:   PainSc: 0-No pain      Patients Stated Pain Goal: 0 (32/20/25 4270)  Complications: No apparent anesthesia complications

## 2018-01-24 NOTE — Anesthesia Postprocedure Evaluation (Signed)
Anesthesia Post Note  Patient: Isabella [name]  Procedure(s) Performed: ECT TX  Patient location during evaluation: PACU Anesthesia Type: General Level of consciousness: awake and alert Pain management: pain level controlled Vital Signs Assessment: post-procedure vital signs reviewed and stable Respiratory status: spontaneous breathing, nonlabored ventilation and respiratory function stable Cardiovascular status: blood pressure returned to baseline and stable Postop Assessment: no apparent nausea or vomiting Anesthetic complications: no     Last Vitals:  Vitals:   01/24/18 1235 01/24/18 1245  BP: (!) 142/92   Pulse:    Resp:    Temp:  36.8 C  SpO2:  94%    Last Pain:  Vitals:   01/24/18 1245  TempSrc:   PainSc: 0-No pain                 Alphonsus Sias

## 2018-01-24 NOTE — Progress Notes (Signed)
Recreation Therapy Notes   Date: 01/24/2018  Time: 9:30 pm   Location: Craft Room   Behavioral response: N/A   Intervention Topic: Stress  Discussion/Intervention: Patient did not attend group.   Clinical Observations/Feedback:  Patient did not attend group.   Airiana Elman LRT/CTRS         Randall Colden 01/24/2018 11:45 AM

## 2018-01-24 NOTE — Progress Notes (Signed)
D: Patient stated slept good last night .Stated appetite is good and energy level  Is normal. Stated concentration is good . Stated on Depression scale 7, hopeless 8 and anxiety 0 .( low 0-10 high) Denies suicidal  homicidal ideations  .  No auditory hallucinations  No pain concerns . Appropriate ADL'S. Interacting with peers and staff. Patient improving  with affect , appetite. . Noted to interact with peers on unit . Emotional and mental status improved . Noted to speak english , verbalized understanding . Coping ability improved. Verbalized feeling  to Probation officer. Voice of no safety concerns . Patient escorted to and from ECT  Without incidence  A: Encourage patient participation with unit programming . Instruction  Given on  Medication , verbalize understanding. R: Voice no other concerns. Staff continue to monitor

## 2018-01-24 NOTE — Procedures (Signed)
ECT SERVICES Physician's Interval Evaluation & Treatment Note  Patient Identification: Isabella [name] MRN:  [id number] Date of Evaluation:  01/24/2018 TX #: [id number]  MADRS:   MMSE:   P.E. Findings:  No change physical  Psychiatric Interval Note:  Much more awake alert and interactive  Subjective:  Patient is a 42 y.o. female seen for evaluation for Electroconvulsive Therapy. Feeling better less depressed  Treatment Summary:   []   Right Unilateral             [x]  Bilateral   % Energy : 1.0 ms 45%   Impedance: 2310 ohms  Seizure Energy Index: 12,701 V squared  Postictal Suppression Index: 90%  Seizure Concordance Index: 97% Medications  Pre Shock: Robinul 0.1 mg, Toradol 30 mg, Brevital 80 mg, succinylcholine 100 mg  Post Shock:    Seizure Duration: 37 seconds by EMG 37 seconds by EEG   Comments: Follow-up Friday  Lungs:  [x]   Clear to auscultation               []  Other:   Heart:    [x]   Regular rhythm             []  irregular rhythm    [x]   Previous H&P reviewed, patient examined and there are NO CHANGES                 []   Previous H&P reviewed, patient examined and there are changes noted.   [name], MD 9/11/201912:01 PM

## 2018-01-24 NOTE — Anesthesia Preprocedure Evaluation (Signed)
Anesthesia Evaluation  Patient identified by MRN, date of birth, ID band Patient awake    Reviewed: Allergy & Precautions, H&P , NPO status , reviewed documented beta blocker date and time   Airway Mallampati: II  TM Distance: >3 FB Neck ROM: full    Dental  (+) Chipped   Pulmonary    Pulmonary exam normal        Cardiovascular hypertension, Normal cardiovascular exam     Neuro/Psych  Headaches, PSYCHIATRIC DISORDERS Anxiety Depression Bipolar Disorder Schizophrenia    GI/Hepatic   Endo/Other    Renal/GU      Musculoskeletal   Abdominal   Peds  Hematology   Anesthesia Other Findings Past Medical History: No date: Bipolar disorder ([place]) No date: Hypertension 07/13/2016: Migraine No date: Schizophrenia [facility])  History reviewed. No pertinent surgical history.  BMI    Body Mass Index:  34.90 kg/m      Reproductive/Obstetrics                             Anesthesia Physical Anesthesia Plan  ASA: III  Anesthesia Plan: General   Post-op Pain Management:    Induction: Intravenous  PONV Risk Score and Plan: Treatment may vary due to age or medical condition and TIVA  Airway Management Planned: Natural Airway and Simple Face Mask  Additional Equipment:   Intra-op Plan:   Post-operative Plan:   Informed Consent: I have reviewed the patients History and Physical, chart, labs and discussed the procedure including the risks, benefits and alternatives for the proposed anesthesia with the patient or authorized representative who has indicated his/her understanding and acceptance.   Dental Advisory Given  Plan Discussed with: CRNA  Anesthesia Plan Comments:         Anesthesia Quick Evaluation

## 2018-01-25 NOTE — Progress Notes (Signed)
Recreation Therapy Notes  Date: 01/25/2018  Time: 9:30 pm   Location: Craft Room   Behavioral response: N/A   Intervention Topic: Creative expressions  Discussion/Intervention: Patient did not attend group.   Clinical Observations/Feedback:  Patient did not attend group.   Mitsuru Dault LRT/CTRS        Viola Placeres 01/25/2018 10:53 AM

## 2018-01-25 NOTE — Progress Notes (Signed)
D: Patient stated slept good last night .Stated appetite  good and energy level  Is normal. Stated concentration is good . Stated on Depression scale1 , hopeless 0 and anxiety 1 .( low 0-10 high) Denies suicidal  homicidal ideations  .  No auditory hallucinations  No pain concerns . Appropriate ADL'S. Interacting with peers and staff.  Attending unit programing.  Emotional and mental status improved . Noted to speak english , verbalized understanding . Coping ability improved. Verbalized feeling  to Probation officer. Voice of no safety concerns .Patient improving  with affect appetite Improved   A: Encourage patient participation with unit programming . Instruction  Given on  Medication , verbalize understanding. R: Voice no other concerns. Staff continue to monitor

## 2018-01-25 NOTE — Progress Notes (Signed)
[facility] MD Progress Note  01/25/2018 8:34 PM Isabella [name]  MRN:  [id number] Subjective: Follow-up for this patient with schizophrenia or psychotic depression.  Patient was sitting up in the day room watching television.  Alert and very expressive.  Normal affect.  Said she is feeling better.  Denied any psychotic symptoms.  She does have some clear memory impairment and confusion probably related to the ECT but looks like otherwise she is doing a lot better.  No physical complaints. 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 Depression [F32.9] 01/10/2018  . Dehydration [E86.0] 01/10/2018  . Anorexia [R63.0] 01/10/2018  . Schizophrenia ([place]) [F20.9] 12/31/2017  . Social anxiety disorder [F40.10] 08/02/2016  . 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 Bipolar disorder ([place])   . Hypertension   . Migraine 07/13/2016  . Schizophrenia ([place])    History reviewed. No pertinent surgical history. Family History:  Family History  Problem Relation Age of Onset  . Mental illness Neg Hx    Family Psychiatric  History: None known Social History:  Social History   Substance and Sexual Activity  Alcohol Use No     Social History   Substance and Sexual Activity  Drug Use No    Social History   Socioeconomic History  . Marital status: Single    Spouse name: Not on file  . Number of children: Not on file  . Years of education: Not on file  . Highest education level: Not on file  Occupational History  . Not on file  Social Needs  . Financial resource strain: Not on file  . Food insecurity:    Worry: Not on file    Inability: Not on file  . Transportation needs:     Medical: Not on file    Non-medical: Not on file  Tobacco Use  . Smoking status: Smoker, Current Status Unknown  . Smokeless tobacco: Never Used  Substance and Sexual Activity  . Alcohol use: No  . Drug use: No  . Sexual activity: Never  Lifestyle  . Physical activity:    Days per week: Not on file    Minutes per session: Not on file  . Stress: Not on file  Relationships  . Social connections:    Talks on phone: Not on file    Gets together: Not on file    Attends religious service: Not on file    Active member of club or organization: Not on file    Attends meetings of clubs or organizations: Not on file    Relationship status: Not on file  Other Topics Concern  . Not on file  Social History Narrative  . Not on file   Additional Social History:                         Sleep: Fair  Appetite:  Fair  Current Medications: Current Facility-Administered Medications  Medication Dose Route Frequency Provider Last Rate Last Dose  . acetaminophen (TYLENOL) tablet 650 mg  650 mg Oral Q6H PRN Simone Tuckey, Madie Reno, MD   650 mg at 01/22/18 1331  . alum & mag hydroxide-simeth (MAALOX/MYLANTA) 200-200-20 MG/5ML suspension 30 mL  30 mL Oral Q4H PRN  Rhythm Gubbels, Madie Reno, MD      . feeding supplement (ENSURE ENLIVE) (ENSURE ENLIVE) liquid 237 mL  237 mL Oral TID BM Dequane Strahan T, MD   237 mL at 01/25/18 1953  . FLUoxetine (PROZAC) capsule 20 mg  20 mg Oral Daily Nohea Kras, Madie Reno, MD   20 mg at 01/25/18 0754  . hydrOXYzine (ATARAX/VISTARIL) tablet 50 mg  50 mg Oral Q6H PRN Myrtle Barnhard T, MD      . hyoscyamine (ANASPAZ) disintergrating tablet 0.125 mg  0.125 mg Oral Q6H PRN Avelynn Sellin T, MD      . magnesium hydroxide (MILK OF MAGNESIA) suspension 30 mL  30 mL Oral Daily PRN Harsimran Westman T, MD      . OLANZapine zydis (ZYPREXA) disintegrating tablet 15 mg  15 mg Oral QHS Kody Brandl, Madie Reno, MD   15 mg at 01/24/18 2150  . traZODone (DESYREL) tablet 100 mg  100 mg Oral QHS PRN Anitra Doxtater, Madie Reno, MD   100 mg at 01/24/18 2150    Lab Results:  Results for orders placed or performed during the hospital encounter of 01/10/18 (from the past 48 hour(s))  Glucose, capillary     Status: None   Collection Time: 01/24/18  7:13 AM  Result Value Ref Range   Glucose-Capillary 88 70 - 99 mg/dL   Comment 1 Notify RN     Blood Alcohol level:  Lab Results  Component Value Date   ETH <10 12/29/2017   ETH <5 84/13/2440    Metabolic Disorder Labs: Lab Results  Component Value Date   HGBA1C 5.4 01/31/2016   MPG 108 01/31/2016   Lab Results  Component Value Date   PROLACTIN 124.2 (H) 01/31/2016   Lab Results  Component Value Date   CHOL 152 01/31/2016   TRIG 147 01/31/2016   HDL 39 (L) 01/31/2016   CHOLHDL 3.9 01/31/2016   VLDL 29 01/31/2016   LDLCALC 84 01/31/2016    Physical Findings: AIMS:  , ,  ,  ,    CIWA:    COWS:     Musculoskeletal: Strength & Muscle Tone: within normal limits Gait & Station: normal Patient leans: N/A  Psychiatric Specialty Exam: Physical Exam  Nursing note and vitals reviewed. Constitutional: She appears well-developed and well-nourished.  HENT:  Head: Normocephalic and atraumatic.  Eyes: Pupils are equal, round, and reactive to light. Conjunctivae are normal.  Neck: Normal range of motion.  Cardiovascular: Regular rhythm and normal heart sounds.  Respiratory: Effort normal. No respiratory distress.  GI: Soft.  Musculoskeletal: Normal range of motion.  Neurological: She is alert.  Skin: Skin is warm and dry.  Psychiatric: She has a normal mood and affect. Her speech is normal and behavior is normal. Judgment and thought content normal. She exhibits abnormal recent memory.    Review of Systems  Constitutional: Negative.   HENT: Negative.   Eyes: Negative.   Respiratory: Negative.   Cardiovascular: Negative.   Gastrointestinal: Negative.   Musculoskeletal: Negative.   Skin: Negative.   Neurological: Negative.    Psychiatric/Behavioral: Negative.     Blood pressure 103/69, pulse (!) 114, temperature 97.9 F (36.6 C), temperature source Oral, resp. rate 18, height 5\' 3"  (1.6 m), weight 89.4 kg, last menstrual period 01/21/2018, SpO2 98 %.Body mass index is 34.9 kg/m.  General Appearance: Fairly Groomed  Eye Contact:  Fair  Speech:  Slow  Volume:  Decreased  Mood:  Euthymic  Affect:  Appropriate  Thought Process:  Goal Directed  Orientation:  Full (Time, Place, and Person)  Thought Content:  Logical  Suicidal Thoughts:  No  Homicidal Thoughts:  No  Memory:  Immediate;   Fair Recent;   Poor Remote;   Fair  Judgement:  Fair  Insight:  Fair  Psychomotor Activity:  Decreased  Concentration:  Concentration: Fair  Recall:  [employer] of Knowledge:  Fair  Language:  Fair  Akathisia:  No  Handed:  Right  AIMS (if indicated):     Assets:  Desire for Improvement Housing Physical Health Social Support  ADL's:  Intact  Cognition:  Impaired,  Mild  Sleep:  Number of Hours: 8     Treatment Plan Summary: Daily contact with patient to assess and evaluate symptoms and progress in treatment, Medication management and Plan Patient really seems to have had an excellent response to ECT and medication.  She is having some cognitive impairment.  My plan at this point would probably be to do ECT tomorrow and then hold off but observe how she is doing over the weekend.  Let her recover a little bit.  No change to medicine.  Patient aware of the plan.  [name], MD 01/25/2018, 8:34 PM

## 2018-01-25 NOTE — Plan of Care (Addendum)
Patient found in day room upon my arrival. Patient is visible and somewhat social this evening. Patient is visibly proud of her progress since I saw her last week. Affect is bright and smiling. Reports mood is "good." Reports eating and voiding adequately. Denies all complaints including SI/HI/AVH. Compliant with HS medications and staff direction. Instruction given regarding NPO status. CBG 86. Verbalizes understanding. Q 15 minute checks maintained. Will continue to monitor throughout the shift. Patient slept 6.75 hours. No apparent distress. Remains NPO. Will endorse care to oncoming shift.  Problem: Education: Goal: Knowledge of [place] [facility] Education information/materials will improve Outcome: Progressing Goal: Emotional status will improve Outcome: Progressing Goal: Mental status will improve Outcome: Progressing Goal: Verbalization of understanding the information provided will improve Outcome: Progressing   Problem: Coping: Goal: Coping ability will improve Outcome: Progressing Goal: Will verbalize feelings Outcome: Progressing   Problem: Safety: Goal: Ability to remain free from injury will improve Outcome: Progressing

## 2018-01-25 NOTE — Plan of Care (Signed)
Noted to interact with peers on unit . Attending unit programing.  Emotional and mental status improved . Noted to speak english , verbalized understanding . Coping ability improved. Verbalized feeling  to Probation officer. Voice of no safety concerns .Patient improving  with affect appetite Improved    Problem: Education: Goal: Knowledge of  General Education information/materials will improve Outcome: Progressing Goal: Emotional status will improve Outcome: Progressing Goal: Mental status will improve Outcome: Progressing Goal: Verbalization of understanding the information provided will improve Outcome: Progressing   Problem: Coping: Goal: Coping ability will improve Outcome: Progressing Goal: Will verbalize feelings Outcome: Progressing   Problem: Safety: Goal: Ability to remain free from injury will improve Outcome: Progressing

## 2018-01-25 NOTE — B[facility] Group Notes (Signed)
LCSW Group Therapy Note  01/25/2018 1:00 pm  Type of Therapy/Topic:  Group Therapy:  Balance in Life  Participation Level:  Minimal  Description of Group:    This group will address the concept of balance and how it feels and looks when one is unbalanced. Patients will be encouraged to process areas in their lives that are out of balance and identify reasons for remaining unbalanced. Facilitators will guide patients in utilizing problem-solving interventions to address and correct the stressor making their life unbalanced. Understanding and applying boundaries will be explored and addressed for obtaining and maintaining a balanced life. Patients will be encouraged to explore ways to assertively make their unbalanced needs known to significant others in their lives, using other group members and facilitator for support and feedback.  Therapeutic Goals: 1. Patient will identify two or more emotions or situations they have that consume much of in their lives. 2. Patient will identify signs/triggers that life has become out of balance:  3. Patient will identify two ways to set boundaries in order to achieve balance in their lives:  4. Patient will demonstrate ability to communicate their needs through discussion and/or role plays  Summary of Patient Progress: Isabella [name] did not actively participate in today's group discussion on balance in life.  CSW asked her to identify an emotion or situation that has consumed much of her life.  Isabella [name] shared that trying to meet the expectations of her life has consumed her life.  Isabella [name] did not wish to engage in additional group discussion, but remained in the group until it ended.     Therapeutic Modalities:   Cognitive Behavioral Therapy Solution-Focused Therapy Assertiveness Training  Devona Konig, [place] 01/25/2018 2:21 PM

## 2018-01-26 ENCOUNTER — Other Ambulatory Visit: Payer: Self-pay | Admitting: Psychiatry

## 2018-01-26 ENCOUNTER — Encounter: Payer: Self-pay | Admitting: Anesthesiology

## 2018-01-26 ENCOUNTER — Inpatient Hospital Stay: Payer: Medicaid Other | Admitting: Certified Registered Nurse Anesthetist

## 2018-01-26 LAB — POCT PREGNANCY, URINE: Preg Test, Ur: NEGATIVE

## 2018-01-26 LAB — GLUCOSE, CAPILLARY: GLUCOSE-CAPILLARY: 89 mg/dL (ref 70–99)

## 2018-01-26 MED ORDER — LABETALOL HCL 5 MG/ML IV SOLN
INTRAVENOUS | Status: AC
  Filled 2018-01-26: qty 4

## 2018-01-26 MED ORDER — HALOPERIDOL LACTATE 5 MG/ML IJ SOLN
INTRAMUSCULAR | Status: AC
  Filled 2018-01-26: qty 1

## 2018-01-26 MED ORDER — SUCCINYLCHOLINE CHLORIDE 20 MG/ML IJ SOLN
INTRAMUSCULAR | Status: DC | PRN
  Administered 2018-01-26: 100 mg via INTRAVENOUS

## 2018-01-26 MED ORDER — GLYCOPYRROLATE 0.2 MG/ML IJ SOLN
INTRAMUSCULAR | Status: AC
  Administered 2018-01-26: 0.1 mg via INTRAVENOUS
  Filled 2018-01-26: qty 1

## 2018-01-26 MED ORDER — MIDAZOLAM HCL 2 MG/2ML IJ SOLN
1.0000 mg | Freq: Once | INTRAMUSCULAR | Status: AC
  Administered 2018-01-26: 1 mg via INTRAVENOUS

## 2018-01-26 MED ORDER — MIDAZOLAM HCL 2 MG/2ML IJ SOLN
INTRAMUSCULAR | Status: AC
  Administered 2018-01-26: 1 mg via INTRAVENOUS
  Filled 2018-01-26: qty 2

## 2018-01-26 MED ORDER — TRAZODONE HCL 100 MG PO TABS: 100.0000 mg | ORAL_TABLET | Freq: Every evening | ORAL | 0 refills | Status: DC | PRN

## 2018-01-26 MED ORDER — METHOHEXITAL SODIUM 0.5 G IJ SOLR
INTRAMUSCULAR | Status: AC
  Filled 2018-01-26: qty 500

## 2018-01-26 MED ORDER — KETOROLAC TROMETHAMINE 30 MG/ML IJ SOLN
INTRAMUSCULAR | Status: AC
  Filled 2018-01-26: qty 1

## 2018-01-26 MED ORDER — GLYCOPYRROLATE 0.2 MG/ML IJ SOLN
0.1000 mg | Freq: Once | INTRAMUSCULAR | Status: AC
  Administered 2018-01-26: 0.1 mg via INTRAVENOUS

## 2018-01-26 MED ORDER — FLUOXETINE HCL 20 MG PO CAPS: 20.0000 mg | ORAL_CAPSULE | Freq: Every day | ORAL | 0 refills | Status: DC

## 2018-01-26 MED ORDER — HALOPERIDOL LACTATE 5 MG/ML IJ SOLN
5.0000 mg | Freq: Once | INTRAMUSCULAR | Status: AC
  Administered 2018-01-26: 5 mg via INTRAVENOUS

## 2018-01-26 MED ORDER — MIDAZOLAM HCL 2 MG/2ML IJ SOLN
INTRAMUSCULAR | Status: AC
  Filled 2018-01-26: qty 2

## 2018-01-26 MED ORDER — KETOROLAC TROMETHAMINE 30 MG/ML IJ SOLN
30.0000 mg | Freq: Once | INTRAMUSCULAR | Status: AC
  Administered 2018-01-26: 30 mg via INTRAVENOUS

## 2018-01-26 MED ORDER — HALOPERIDOL LACTATE 5 MG/ML IJ SOLN
INTRAMUSCULAR | Status: AC
  Administered 2018-01-26: 5 mg via INTRAVENOUS
  Filled 2018-01-26: qty 1

## 2018-01-26 MED ORDER — MIDAZOLAM HCL 2 MG/2ML IJ SOLN
2.0000 mg | Freq: Once | INTRAMUSCULAR | Status: AC
  Administered 2018-01-26: 2 mg via INTRAVENOUS

## 2018-01-26 MED ORDER — SODIUM CHLORIDE 0.9 % IV SOLN
INTRAVENOUS | Status: DC | PRN
  Administered 2018-01-26: 11:00:00 via INTRAVENOUS

## 2018-01-26 MED ORDER — SODIUM CHLORIDE 0.9 % IV SOLN
500.0000 mL | Freq: Once | INTRAVENOUS | Status: AC
  Administered 2018-01-26: 500 mL via INTRAVENOUS

## 2018-01-26 MED ORDER — OLANZAPINE 15 MG PO TBDP: 15.0000 mg | ORAL_TABLET | Freq: Every day | ORAL | 0 refills | Status: DC

## 2018-01-26 MED ORDER — METHOHEXITAL SODIUM 100 MG/10ML IV SOSY
PREFILLED_SYRINGE | INTRAVENOUS | Status: DC | PRN
  Administered 2018-01-26: 80 mg via INTRAVENOUS

## 2018-01-26 NOTE — Anesthesia Postprocedure Evaluation (Signed)
Anesthesia Post Note  Patient: Isabella [name]  Procedure(s) Performed: ECT TX  Patient location during evaluation: PACU Anesthesia Type: General Level of consciousness: awake and alert Pain management: pain level controlled Vital Signs Assessment: post-procedure vital signs reviewed and stable Respiratory status: spontaneous breathing, nonlabored ventilation, respiratory function stable and patient connected to nasal cannula oxygen Cardiovascular status: blood pressure returned to baseline and stable Postop Assessment: no apparent nausea or vomiting Anesthetic complications: no     Last Vitals:  Vitals:   01/26/18 1152 01/26/18 1208  BP: 121/80 122/75  Pulse: 95 79  Resp: 20 20  Temp: 36.9 C   SpO2: 98% 100%    Last Pain:  Vitals:   01/26/18 1208  TempSrc:   PainSc: 0-No pain                 Precious Haws Piscitello

## 2018-01-26 NOTE — Procedures (Signed)
ECT SERVICES Physician's Interval Evaluation & Treatment Note  Patient Identification: Isabella [name] MRN:  [id number] Date of Evaluation:  01/26/2018 TX #: 6  MADRS:   MMSE:   P.E. Findings:  No change to physical exam except that she is cleaner and more awake and alert  Psychiatric Interval Note:  Much more awake alert with emotional reactivity and appropriate behavior.  Subjective:  Patient is a 42 y.o. female seen for evaluation for Electroconvulsive Therapy. Complaint except for wanting to be discharged soon  Treatment Summary:   []   Right Unilateral             [x]  Bilateral   % Energy : 1.0 ms 45%   Impedance: 1320 ohms  Seizure Energy Index: 13,254 V squared  Postictal Suppression Index: 87%  Seizure Concordance Index: 98%  Medications  Pre Shock: Robinul 0.1 mg Toradol 30 mg Brevital 80 mg succinylcholine 100 mg  Post Shock:    Seizure Duration: EMG 20 seconds EEG 38 seconds   Comments: Likely to be discharged today and I am going to see if we can arrange to have her come back in 1 week for follow-up.  Lungs:  [x]   Clear to auscultation               []  Other:   Heart:    [x]   Regular rhythm             []  irregular rhythm    [x]   Previous H&P reviewed, patient examined and there are NO CHANGES                 []   Previous H&P reviewed, patient examined and there are changes noted.   [name], MD 9/13/201910:58 AM

## 2018-01-26 NOTE — Progress Notes (Signed)
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  Today she says she is feeling much better.  Denies feeling depressed at all.  Denies any suicidal thoughts.  Denies any psychotic symptoms.  She is able to participate appropriately in groups.  Interacts well with others.  Not acting bizarrely.  She is taking care of her hygiene very well.  On interview she has a little bit of impairment with her memory but is reasonably intact. 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 Depression [F32.9] 01/10/2018  . Dehydration [E86.0] 01/10/2018  . Anorexia [R63.0] 01/10/2018  . Schizophrenia ([place]) [F20.9] 12/31/2017  . Social anxiety disorder [F40.10] 08/02/2016  . 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 Bipolar disorder ([place])   . Hypertension   . Migraine 07/13/2016  . Schizophrenia ([place])    History reviewed. No pertinent surgical history. Family History:  Family History  Problem Relation Age of Onset  . Mental illness Neg Hx    Family Psychiatric  History: See previous notes Social History:  Social History   Substance and Sexual Activity  Alcohol Use No     Social History   Substance and Sexual Activity  Drug Use No    Social History   Socioeconomic History  . Marital status: Single    Spouse name: Not on file  . Number of children: Not on file  . Years of education: Not on file  . Highest education level: Not on file  Occupational History  . Not on file  Social Needs  . Financial resource strain: Not on file  . Food insecurity:    Worry: Not on file     Inability: Not on file  . Transportation needs:    Medical: Not on file    Non-medical: Not on file  Tobacco Use  . Smoking status: Smoker, Current Status Unknown  . Smokeless tobacco: Never Used  Substance and Sexual Activity  . Alcohol use: No  . Drug use: No  . Sexual activity: Never  Lifestyle  . Physical activity:    Days per week: Not on file    Minutes per session: Not on file  . Stress: Not on file  Relationships  . Social connections:    Talks on phone: Not on file    Gets together: Not on file    Attends religious service: Not on file    Active member of club or organization: Not on file    Attends meetings of clubs or organizations: Not on file    Relationship status: Not on file  Other Topics Concern  . Not on file  Social History Narrative  . Not on file   Additional Social History:                         Sleep: Fair  Appetite:  Fair  Current Medications: Current Facility-Administered Medications  Medication Dose Route Frequency Provider Last Rate Last Dose  . acetaminophen (TYLENOL) tablet 650 mg  650 mg Oral Q6H PRN Deveon Kisiel, Madie Reno, MD   650 mg at 01/22/18 1331  . alum & mag  hydroxide-simeth (MAALOX/MYLANTA) 200-200-20 MG/5ML suspension 30 mL  30 mL Oral Q4H PRN Kinsley Holderman T, MD      . feeding supplement (ENSURE ENLIVE) (ENSURE ENLIVE) liquid 237 mL  237 mL Oral TID BM Jalaiya Oyster T, MD   237 mL at 01/26/18 1807  . FLUoxetine (PROZAC) capsule 20 mg  20 mg Oral Daily Akshita Italiano T, MD   20 mg at 01/26/18 1401  . hydrOXYzine (ATARAX/VISTARIL) tablet 50 mg  50 mg Oral Q6H PRN Ichael Pullara T, MD      . hyoscyamine (ANASPAZ) disintergrating tablet 0.125 mg  0.125 mg Oral Q6H PRN Muneeb Veras T, MD      . magnesium hydroxide (MILK OF MAGNESIA) suspension 30 mL  30 mL Oral Daily PRN Aliah Eriksson, Madie Reno, MD      . midazolam (VERSED) 2 MG/2ML injection           . OLANZapine zydis (ZYPREXA) disintegrating tablet 15 mg  15 mg Oral QHS Ponciano Shealy, Madie Reno, MD   15 mg at 01/25/18 2155  . traZODone (DESYREL) tablet 100 mg  100 mg Oral QHS PRN Ahniyah Giancola, Madie Reno, MD   100 mg at 01/25/18 2156    Lab Results:  Results for orders placed or performed during the hospital encounter of 01/10/18 (from the past 48 hour(s))  Glucose, capillary     Status: None   Collection Time: 01/26/18  6:39 AM  Result Value Ref Range   Glucose-Capillary 89 70 - 99 mg/dL  Pregnancy, urine POC     Status: None   Collection Time: 01/26/18  9:55 AM  Result Value Ref Range   Preg Test, Ur NEGATIVE NEGATIVE    Comment:        THE SENSITIVITY OF THIS METHODOLOGY IS >24 mIU/mL     Blood Alcohol level:  Lab Results  Component Value Date   ETH <10 12/29/2017   ETH <5 57/32/2025    Metabolic Disorder Labs: Lab Results  Component Value Date   HGBA1C 5.4 01/31/2016   MPG 108 01/31/2016   Lab Results  Component Value Date   PROLACTIN 124.2 (H) 01/31/2016   Lab Results  Component Value Date   CHOL 152 01/31/2016   TRIG 147 01/31/2016   HDL 39 (L) 01/31/2016   CHOLHDL 3.9 01/31/2016   VLDL 29 01/31/2016   LDLCALC 84 01/31/2016    Physical Findings: AIMS:  , ,  ,  ,    CIWA:    COWS:     Musculoskeletal: Strength & Muscle Tone: within normal limits Gait & Station: normal Patient leans: N/A  Psychiatric Specialty Exam: Physical Exam  Nursing note and vitals reviewed. Constitutional: She appears well-developed and well-nourished.  HENT:  Head: Normocephalic and atraumatic.  Eyes: Pupils are equal, round, and reactive to light. Conjunctivae are normal.  Neck: Normal range of motion.  Cardiovascular: Regular rhythm and normal heart sounds.  Respiratory: Effort normal. No respiratory distress.  GI: Soft.  Musculoskeletal: Normal range of motion.  Neurological: She is alert.  Skin: Skin is warm and dry.  Psychiatric: She has a normal mood and affect. Her speech is normal and behavior is normal. Judgment and thought content normal. She exhibits  abnormal recent memory.    Review of Systems  Constitutional: Negative.   HENT: Negative.   Eyes: Negative.   Respiratory: Negative.   Cardiovascular: Negative.   Gastrointestinal: Negative.   Musculoskeletal: Negative.   Skin: Negative.   Neurological: Negative.   Psychiatric/Behavioral: Negative.  Blood pressure 125/84, pulse 86, temperature 98.5 F (36.9 C), temperature source Oral, resp. rate 20, height 5\' 3"  (1.6 m), weight 90.7 kg, last menstrual period 01/21/2018, SpO2 100 %.Body mass index is 35.43 kg/m.  General Appearance: Fairly Groomed  Eye Contact:  Good  Speech:  Clear and Coherent  Volume:  Normal  Mood:  Euthymic  Affect:  Congruent  Thought Process:  Goal Directed  Orientation:  Full (Time, Place, and Person)  Thought Content:  Logical  Suicidal Thoughts:  No  Homicidal Thoughts:  No  Memory:  Immediate;   Fair Recent;   Fair Remote;   Fair  Judgement:  Fair  Insight:  Fair  Psychomotor Activity:  Normal  Concentration:  Concentration: Fair  Recall:  [employer] of Knowledge:  Fair  Language:  Fair  Akathisia:  No  Handed:  Right  AIMS (if indicated):     Assets:  Desire for Improvement  ADL's:  Intact  Cognition:  Impaired,  Mild  Sleep:  Number of Hours: 6.75     Treatment Plan Summary: Daily contact with patient to assess and evaluate symptoms and progress in treatment, Medication management and Plan Patient has done very well with ECT.  She is asking for discharge.  At this point I think we have probably reached a plateau of improvement.  Plan will be to see how she does over the weekend while continuing current medicine.  I will plan for tentative discharge on Monday and we are going to hope that we can arrange for maintenance ECT.  Patient understands the plan.  No change for now.  [name], MD 01/26/2018, 7:27 PM

## 2018-01-26 NOTE — [facility] Group Notes (Signed)
[facility] Group Notes:  (Nursing/MHT/Case Management/Adjunct)  Date:  01/26/2018  Time:  1:22 AM  Type of Therapy:  Group Therapy  Participation Level:  Active  Participation Quality:  Appropriate  Affect:  Appropriate  Cognitive:  Appropriate  Insight:  Appropriate  Engagement in Group:  Engaged  Modes of Intervention:  Discussion  Summary of Progress/Problems: MHT reviewed rules and expectations of the unit. 1. Visitation hours and number of visitors at one time 2. Phone hours 3. Where to go for vitals and the time it would be called for 4. Informed of routine checks and encouraged to cover appropriately as MHT's would be looking in on them at night 5. No sharing clothes or items 6. No touching, hugging, doing other patient's hair 7. No going in other patient's room 8. No walking down halls other than your own 9. No use of last names on unit MHT handed out snack menu and explained how snack would be distributed MHT reminded patients not to take food or drink, other than water to the rooms MHT explained why food was not allowed in rooms MHT was called out to assist with a patient on the unit MHT was unable to review goals with each patient. Barnie Mort 01/26/2018, 1:22 AM

## 2018-01-26 NOTE — [facility] Group Notes (Signed)
01/26/2018 1PM  Type of Therapy and Topic:  Group Therapy:  Feelings around Relapse and Recovery  Participation Level:  Active   Description of Group:    Patients in this group will discuss emotions they experience before and after a relapse. They will process how experiencing these feelings, or avoidance of experiencing them, relates to having a relapse. Facilitator will guide patients to explore emotions they have related to recovery. Patients will be encouraged to process which emotions are more powerful. They will be guided to discuss the emotional reaction significant others in their lives may have to patients' relapse or recovery. Patients will be assisted in exploring ways to respond to the emotions of others without this contributing to a relapse.  Therapeutic Goals: 1. Patient will identify two or more emotions that lead to a relapse for them 2. Patient will identify two emotions that result when they relapse 3. Patient will identify two emotions related to recovery 4. Patient will demonstrate ability to communicate their needs through discussion and/or role plays   Summary of Patient Progress: Actively and appropriately engaged in the group. Patient practiced active listening when interacting with the facilitator and other group members. Patient participated in the recovery game. Patient is still in the process of obtaining treatment goals.      Therapeutic Modalities:   Cognitive Behavioral Therapy Solution-Focused Therapy Assertiveness Training Relapse Prevention Therapy   Darin Engels, [place] 01/26/2018 2:29 PM

## 2018-01-26 NOTE — Progress Notes (Signed)
Recreation Therapy Notes  Date: 01/26/2018  Time: 9:30 pm   Location: Craft Room   Behavioral response: N/A   Intervention Topic: Leisure  Discussion/Intervention: Patient did not attend group.   Clinical Observations/Feedback:  Patient did not attend group.   Andreya Lacks LRT/CTRS        Chantrice Hagg 01/26/2018 11:43 AM

## 2018-01-26 NOTE — Transfer of Care (Signed)
Immediate Anesthesia Transfer of Care Note  Patient: Isabella [name]  Procedure(s) Performed: ECT TX  Patient Location: PACU  Anesthesia Type:General  Level of Consciousness: awake and sedated  Airway & Oxygen Therapy: Patient Spontanous Breathing and Patient connected to face mask oxygen  Post-op Assessment: Report given to RN and Post -op Vital signs reviewed and stable  Post vital signs: Reviewed and stable  Last Vitals:  Vitals Value Taken Time  BP 159/136 01/26/2018 11:11 AM  Temp    Pulse 97 01/26/2018 11:12 AM  Resp 30 01/26/2018 11:12 AM  SpO2 96 % 01/26/2018 11:12 AM  Vitals shown include unvalidated device data.  Last Pain:  Vitals:   01/26/18 1020  TempSrc:   PainSc: 0-No pain      Patients Stated Pain Goal: 0 (06/89/34 0684)  Complications: No apparent anesthesia complications

## 2018-01-26 NOTE — Progress Notes (Signed)
Received Isabella [name] from the PACU after ECT, she is alert, but somewhat disorganized. She knew where he was and the year, but could not state the name of the president. She ate although she had difficulty  mixing the hot chocolate. She was unsteady on her feet and was escorted to her room. She returned to the day room and sat with her peers.

## 2018-01-26 NOTE — Anesthesia Post-op Follow-up Note (Signed)
Anesthesia QCDR form completed.        

## 2018-01-26 NOTE — H&P (Signed)
Isabella [name] is an 43 y.o. female.   Chief Complaint: Patient's mood is much improved.  Some memory complaints.  No physical complaint. HPI: History of recurrent severe depression episodes with catatonia now showing significant improvement with ECT and medicine  Past Medical History:  Diagnosis Date  . Bipolar disorder ([place])   . Hypertension   . Migraine 07/13/2016  . Schizophrenia ([place])     History reviewed. No pertinent surgical history.  Family History  Problem Relation Age of Onset  . Mental illness Neg Hx    Social History:  reports that she has been smoking. She has never used smokeless tobacco. She reports that she does not drink alcohol or use drugs.  Allergies: No Known Allergies  Medications Prior to Admission  Medication Sig Dispense Refill  . acetaminophen (TYLENOL) 325 MG tablet Take 2 tablets (650 mg total) by mouth every 6 (six) hours as needed for mild pain.    Marland Kitchen alum & mag hydroxide-simeth (MAALOX/MYLANTA) 200-200-20 MG/5ML suspension Take 30 mLs by mouth every 4 (four) hours as needed for indigestion. 355 mL 0  . ARIPiprazole (ABILIFY) 20 MG tablet Take 1 tablet (20 mg total) by mouth daily. For mood control    . escitalopram (LEXAPRO) 5 MG tablet Take 3 tablets (15 mg total) by mouth daily. For depression    . feeding supplement, ENSURE ENLIVE, (ENSURE ENLIVE) LIQD Take 237 mLs by mouth 2 (two) times daily between meals. For nutritional supplementation 237 mL 12  . hydrOXYzine (ATARAX/VISTARIL) 50 MG tablet Take 1 tablet (50 mg total) by mouth every 6 (six) hours as needed for anxiety. 1 tablet 0  . hyoscyamine (ANASPAZ) 0.125 MG TBDP disintergrating tablet Take 1 tablet (0.125 mg total) by mouth every 4 (four) hours as needed for cramping.  0  . magnesium hydroxide (MILK OF MAGNESIA) 400 MG/5ML suspension Take 30 mLs by mouth daily as needed for mild constipation. 360 mL 0  . OLANZapine zydis (ZYPREXA) 5 MG disintegrating tablet Take 1 tablet (5 mg  total) by mouth every 8 (eight) hours as needed (agitation/psychosis).    . traZODone (DESYREL) 100 MG tablet Take 1 tablet (100 mg total) by mouth at bedtime as needed for sleep.      Results for orders placed or performed during the hospital encounter of 01/10/18 (from the past 48 hour(s))  Glucose, capillary     Status: None   Collection Time: 01/26/18  6:39 AM  Result Value Ref Range   Glucose-Capillary 89 70 - 99 mg/dL  Pregnancy, urine POC     Status: None   Collection Time: 01/26/18  9:55 AM  Result Value Ref Range   Preg Test, Ur NEGATIVE NEGATIVE    Comment:        THE SENSITIVITY OF THIS METHODOLOGY IS >24 mIU/mL    No results found.  Review of Systems  Constitutional: Negative.   HENT: Negative.   Eyes: Negative.   Respiratory: Negative.   Cardiovascular: Negative.   Gastrointestinal: Negative.   Musculoskeletal: Negative.   Skin: Negative.   Neurological: Negative.   Psychiatric/Behavioral: Positive for memory loss. Negative for depression, hallucinations, substance abuse and suicidal ideas. The patient is not nervous/anxious and does not have insomnia.     Blood pressure 98/76, pulse 89, temperature 98.2 F (36.8 C), temperature source Oral, resp. rate 18, height 5\' 3"  (1.6 m), weight 90.7 kg, last menstrual period 01/21/2018, SpO2 98 %. Physical Exam  Nursing note and vitals reviewed. Constitutional: She appears well-developed and  well-nourished.  HENT:  Head: Normocephalic and atraumatic.  Eyes: Pupils are equal, round, and reactive to light. Conjunctivae are normal.  Neck: Normal range of motion.  Cardiovascular: Regular rhythm and normal heart sounds.  Respiratory: Effort normal. No respiratory distress.  GI: Soft.  Musculoskeletal: Normal range of motion.  Neurological: She is alert.  Skin: Skin is warm and dry.  Psychiatric: She has a normal mood and affect. Judgment and thought content normal. Her affect is not blunt. Her speech is delayed. She is  slowed. Cognition and memory are impaired.     Assessment/Plan Treatment today and then we are looking at a likely discharge plan possibly as soon as tomorrow  [name], MD 01/26/2018, 10:56 AM

## 2018-01-26 NOTE — Anesthesia Preprocedure Evaluation (Addendum)
Anesthesia Evaluation  Patient identified by MRN, date of birth, ID band Patient awake    Reviewed: Allergy & Precautions, H&P , NPO status , Patient's Chart, lab work & pertinent test results, reviewed documented beta blocker date and time   History of Anesthesia Complications Negative for: history of anesthetic complications  Airway Mallampati: II  TM Distance: >3 FB Neck ROM: limited    Dental  (+) Chipped   Pulmonary neg pulmonary ROS, neg shortness of breath,    Pulmonary exam normal        Cardiovascular Exercise Tolerance: Good hypertension, Normal cardiovascular exam     Neuro/Psych  Headaches, PSYCHIATRIC DISORDERS Anxiety Depression Bipolar Disorder Schizophrenia negative psych ROS   GI/Hepatic negative GI ROS, Neg liver ROS, neg GERD  ,  Endo/Other  negative endocrine ROS  Renal/GU negative Renal ROS  negative genitourinary   Musculoskeletal   Abdominal   Peds  Hematology negative hematology ROS (+)   Anesthesia Other Findings Past Medical History: No date: Bipolar disorder ([place]) No date: Hypertension 07/13/2016: Migraine No date: Schizophrenia ([place])  History reviewed. No pertinent surgical history.  BMI    Body Mass Index:  34.90 kg/m      Reproductive/Obstetrics negative OB ROS                            Anesthesia Physical  Anesthesia Plan  ASA: III  Anesthesia Plan: General   Post-op Pain Management:    Induction: Intravenous  PONV Risk Score and Plan: Treatment may vary due to age or medical condition and TIVA  Airway Management Planned: Natural Airway and Simple Face Mask  Additional Equipment:   Intra-op Plan:   Post-operative Plan:   Informed Consent: I have reviewed the patients History and Physical, chart, labs and discussed the procedure including the risks, benefits and alternatives for the proposed anesthesia with the patient or authorized  representative who has indicated his/her understanding and acceptance.   Dental Advisory Given  Plan Discussed with: CRNA  Anesthesia Plan Comments: (Patient consented for risks of anesthesia including but not limited to:  - adverse reactions to medications - risk of intubation if required - damage to teeth, lips or other oral mucosa - sore throat or hoarseness - Damage to heart, brain, lungs or loss of life  Patient voiced understanding.)        Anesthesia Quick Evaluation

## 2018-01-26 NOTE — Tx Team (Signed)
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 acetaminophen (TYLENOL) tablet 650 mg  650 mg Oral Q6H PRN Clapacs, Madie Reno, MD   650 mg at 01/22/18 1331  . alum & mag hydroxide-simeth (MAALOX/MYLANTA) 200-200-20 MG/5ML suspension 30 mL  30 mL Oral Q4H PRN Clapacs, John T, MD      . feeding supplement (ENSURE ENLIVE) (ENSURE ENLIVE) liquid 237 mL  237 mL Oral TID BM Clapacs, John T, MD   237 mL at 01/25/18 1953  . FLUoxetine (PROZAC) capsule 20 mg  20 mg Oral Daily Clapacs, Madie Reno, MD   20 mg at 01/25/18 0754  . hydrOXYzine (ATARAX/VISTARIL) tablet 50 mg  50 mg Oral Q6H PRN Clapacs, John T, MD      . hyoscyamine (ANASPAZ) disintergrating tablet 0.125 mg  0.125 mg Oral Q6H PRN Clapacs, John T, MD      . magnesium hydroxide (MILK OF MAGNESIA) suspension 30 mL  30 mL Oral Daily PRN Clapacs, John T, MD      . OLANZapine zydis (ZYPREXA) disintegrating tablet 15 mg  15 mg Oral QHS Clapacs, Madie Reno, MD   15 mg at 01/25/18 2155  . traZODone (DESYREL) tablet 100 mg  100 mg Oral QHS PRN Clapacs, Madie Reno, MD   100 mg at 01/25/18 2156   PTA Medications: Medications Prior to Admission  Medication Sig Dispense Refill Last Dose  . acetaminophen (TYLENOL) 325 MG tablet Take 2 tablets (650 mg total) by mouth every 6 (six) hours as needed for mild pain.     Marland Kitchen alum & mag hydroxide-simeth (MAALOX/MYLANTA) 200-200-20 MG/5ML suspension Take 30 mLs by mouth every 4 (four) hours as needed for indigestion. 355 mL 0   . ARIPiprazole (ABILIFY) 20 MG tablet Take 1 tablet (20 mg total) by mouth  daily. For mood control     . escitalopram (LEXAPRO) 5 MG tablet Take 3 tablets (15 mg total) by mouth daily. For depression     . feeding supplement, ENSURE ENLIVE, (ENSURE ENLIVE) LIQD Take 237 mLs by mouth 2 (two) times daily between meals. For nutritional supplementation 237 mL 12   . hydrOXYzine (ATARAX/VISTARIL) 50 MG tablet Take 1 tablet (50 mg total) by mouth every 6 (six) hours as needed for anxiety. 1 tablet 0   . hyoscyamine (ANASPAZ) 0.125 MG TBDP disintergrating tablet Take 1 tablet (0.125 mg total) by mouth every 4 (four) hours as needed for cramping.  0   . magnesium hydroxide (MILK OF MAGNESIA) 400 MG/5ML suspension Take 30 mLs by mouth daily as needed for mild constipation. 360 mL 0   . OLANZapine zydis (ZYPREXA) 5 MG disintegrating tablet Take 1 tablet (5 mg total) by mouth every 8 (eight) hours as needed (agitation/psychosis).     . traZODone (DESYREL) 100 MG tablet Take 1 tablet (100 mg total) by mouth at bedtime as needed for sleep.       Patient Stressors: Health problems Medication change or noncompliance  Patient Strengths: Ability for insight Capable of independent living Physical Health Supportive family/friends  Treatment Modalities: Medication Management, Group therapy, Case management,  1 to 1 session with clinician, Psychoeducation, Recreational therapy.  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  Therapeutic Interventions: 1 to 1  sessions, Unit Group sessions and Medication administration.  Evaluation of Outcomes: Progressing   9/9: Patient remains withdrawn and flat.  Little activity.  Hardly ever gets out of bed.  Encouraged patient again to get up and move around and go to groups.  Praised her improvement in hygiene.  Patient will need to continue ECT treatment Noticeably attending to hygiene better and changed into clothing she brought in.  Verbalizes she is doing much better this evening. Pt. Smiling more and affect is brighter. Pt. 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  Therapeutic Interventions: 1 to 1 sessions, Unit Group sessions and Medication administration.  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 RN will report any adverse and/or side effects to prescribing provider.  Therapeutic Interventions: 1 on 1 counseling sessions, Psychoeducation, Medication administration, Evaluate responses to treatment, Monitor vital signs and CBGs as ordered, Perform/monitor CIWA, COWS, AIMS and Fall Risk screenings as ordered, Perform wound care treatments as ordered.  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  Short Term Goals: Engage patient in aftercare planning with referrals and resources, Increase social support, Increase ability to appropriately verbalize feelings, Identify triggers associated with mental health/substance abuse issues and Increase skills for wellness and recovery  Therapeutic Interventions: Assess for all discharge needs, 1 to 1 time with Social worker, Explore available resources and support systems, Assess for adequacy in community support network, Educate family and significant other(s) on suicide prevention, Complete Psychosocial Assessment, Interpersonal group therapy.  Evaluation of Outcomes: Progressing   Progress in Treatment: Attending groups: Yes. Participating in groups: Yes. Taking medication as prescribed: Yes. Toleration medication: Yes. Family/Significant other contact made: Yes, individual(s) contacted:  Patients daughter-in-law Patient understands diagnosis: Yes. Discussing patient identified problems/goals with staff: Yes. Medical problems stabilized or resolved: Yes. Denies suicidal/homicidal ideation: Yes. Issues/concerns per patient self-inventory: No. Other:  New problem(s) identified: No,  Describe:  None  New Short Term/Long Term Goal(s):  To feel better  Patient Goals:  To feel better  Discharge Plan or Barriers: To return home and follow up with outpatient treatment.  Reason for Continuation of Hospitalization: Depression Medication stabilization  Estimated Length of Stay: 3 days.  Patient Stressors: N/A Patient Goal: Patient will engage in groups without prompting or encouragement from LRT x3 group sessions within 5 recreation therapy group sessions  Attendees: Patient:  01/26/2018 10:46 AM  Physician: [name], MD 01/26/2018 10:46 AM  Nursing:  01/26/2018 10:46 AM  RN Care Manager: 01/26/2018 10:46 AM  Social Worker: Darin Engels, [place] 01/26/2018 10:46 AM  Recreational Therapist:  01/26/2018 10:46 AM  Other:  01/26/2018 10:46 AM  Other:  01/26/2018 10:46 AM  Other: 01/26/2018 10:46 AM    Scribe for Treatment Team: Darin Engels, LCSW 01/26/2018 10:46 AM

## 2018-01-27 NOTE — BHH Group Notes (Signed)
[place] Group Notes:  (Nursing/MHT/Case Management/Adjunct)  Date:  01/27/2018  Time:  12:08 AM  Type of Therapy:  Group Therapy  Participation Level:  Active  Participation Quality:  Appropriate  Affect:  Appropriate  Cognitive:  Appropriate  Insight:  Appropriate  Engagement in Group:  Engaged  Modes of Intervention:  Discussion  Summary of Progress/Problems: Kessie attended group Ahlaya did not share her goal for the day, but completed her self inventory sheet. Toshiye reports a good appetite and high energy. No problems with sleep reported Reports high depression Denies harmful ideation to self and others No physical pain reported MHT reviewed the rules and expectations of the unit. 1. Visitation hours and number of visitors 2. Phone hours 3. No food in rooms (explained why food was not permitted in rooms) 4. No sharing clothes or borrowing clothes 5. Explained how snack would be provided 6. No sharing personal information or last names 7. No touching, hugging, doing hair, or going into other patients rooms MHT processed with patients about the importance of attending groups MHT explained groups were designed to help patients learn new coping skills and gain insight into why they were admitted MHT explained learning new coping skills and new things about self would help prepare them for discharge MHT processed with group about after care and the importance of taking medications MHT encouraged patients to talk with providers about their care. MHT addressed concerns and answered questions related to care MHT informed patients to be active in creating their person-centered plan MHT informed group of services available upon discharge, even those who do not have insurance  Barnie Mort 01/27/2018, 12:08 AM

## 2018-01-27 NOTE — Plan of Care (Addendum)
Patient found in day room with visitors upon my arrival. Patient had long and happy visit this evening. Patient mood and affect continues to improve. Reports she is feeling happy. Affect is bright and smiling. Patient has mild confusion but does not voice concern over this. Requires reminders and prompting but is pleasant and cooperative. Denies SI/HI/AVH. Eating and voiding adequately. Compliant with HS medications and staff direction. Given Trazodone for sleep with positive results. Q 15 minute checks maintained. Will continue to monitor throughout the shift. Patient slept 6.25 hours. No apparent distress. Reports sleep was restful. Will endorse care to oncoming shift.  Problem: Education: Goal: Knowledge of [place] [facility] Education information/materials will improve Outcome: Progressing Goal: Emotional status will improve Outcome: Progressing Goal: Mental status will improve Outcome: Progressing   Problem: Coping: Goal: Coping ability will improve Outcome: Progressing   Problem: Safety: Goal: Ability to remain free from injury will improve Outcome: Progressing

## 2018-01-27 NOTE — B[facility] Group Notes (Signed)
LCSW Group Therapy Note  01/27/2018 1:15pm  Type of Therapy and Topic:  Group Therapy:  Healthy Self Image and Positive Change  Participation Level:  Did Not Attend   Description of Group:  In this group, patients will compare and contrast their current "I am...." statements to the visions they identify as desirable for their lives.  Patients discuss fears and how they can make positive changes in their cognitions that will positively impact their behaviors.  Facilitator played a motivational 3-minute speech and patients were left with the task of thinking about what "I am...." statements they can start using in their lives immediately.  Therapeutic Goals: 1. Patient will state their current self-perception as expressed in an "I Am" statement 2. Patient will contrast this with their desired vision for their live 3. Patient will identify 3 fears that negatively impact their behavior 4. Patient will discuss cognitive distortions that stem from their fears 5. Patient will verbalize statements that challenge their cognitive distortions  Summary of Patient Progress:  Pt was invited to attend group but chose not to attend. CSW will continue to encourage pt to attend group throughout their admission.     Therapeutic Modalities Cognitive Behavioral Therapy Motivational Interviewing  Mesha Schamberger  CUEBAS-COLON, LCSW 01/27/2018 11:17 AM

## 2018-01-27 NOTE — Progress Notes (Signed)
Received Isabella [name] after breakfast this AM, she was compliant with her medication. She endorsed feeling better which consists of depression and anxiety. She does not feel suicidal.

## 2018-01-28 NOTE — Plan of Care (Addendum)
Patient found in day room upon my arrival. Patient is visible but not social this evening. Patient mood and affect are bright. Patient remains mildly confused but cooperative and pleasant. Denies SI/HI/AVH. Denies depression and anxiety. Appetite is adequate. Compliant with HS medications and staff direction. Given Trazodone for sleep with positive results. Q 15 minute checks maintained. Will continue to monitor throughout the shift. Patient slept 7.5 hours. No apparent distress. Will endorse care to oncoming shift.  Problem: Education: Goal: Emotional status will improve Outcome: Progressing Goal: Mental status will improve Outcome: Progressing   Problem: Coping: Goal: Coping ability will improve Outcome: Progressing   Problem: Safety: Goal: Ability to remain free from injury will improve Outcome: Progressing

## 2018-01-28 NOTE — BHH Group Notes (Signed)
LCSW Group Therapy Note 01/28/2018 1:15pm  Type of Therapy and Topic: Group Therapy: Feelings Around Returning Home & Establishing a Supportive Framework and Supporting Oneself When Supports Not Available  Participation Level: Did Not Attend  Description of Group:  Patients first processed thoughts and feelings about upcoming discharge. These included fears of upcoming changes, lack of change, new living environments, judgements and expectations from others and overall stigma of mental health issues. The group then discussed the definition of a supportive framework, what that looks and feels like, and how do to discern it from an unhealthy non-supportive network. The group identified different types of supports as well as what to do when your family/friends are less than helpful or unavailable  Therapeutic Goals  1. Patient will identify one healthy supportive network that they can use at discharge. 2. Patient will identify one factor of a supportive framework and how to tell it from an unhealthy network. 3. Patient able to identify one coping skill to use when they do not have positive supports from others. 4. Patient will demonstrate ability to communicate their needs through discussion and/or role plays.  Summary of Patient Progress:  Pt was invited to attend group but chose not to attend. CSW will continue to encourage pt to attend group throughout their admission.    Therapeutic Modalities Cognitive Behavioral Therapy Motivational Interviewing   Haedyn Breau  CUEBAS-COLON, LCSW 01/28/2018 10:09 AM

## 2018-01-28 NOTE — Progress Notes (Signed)
Received Isabella [name] this AM sleep in bed, she was awaken for breakfast. She was compliant with her medications and stated feeling better.  Feeling better, means less depressed. She has been OOB in the milieu at interval throughout the day.

## 2018-01-28 NOTE — BHH Group Notes (Signed)
[place] Group Notes:  (Nursing/MHT/Case Management/Adjunct)  Date:  01/28/2018  Time:  12:44 AM  Type of Therapy:  Group Therapy  Participation Level:  Active  Participation Quality:  Appropriate  Affect:  Appropriate  Cognitive:  Appropriate  Insight:  Good  Engagement in Group:  Engaged  Modes of Intervention:  Support  Summary of Progress/Problems:  Isabella [name] 01/28/2018, 12:44 AM

## 2018-01-28 NOTE — Progress Notes (Addendum)
[facility] MD Progress Note  01/28/2018 2:03 PM Isabella [name]  MRN:  [id number] Subjective: Patient endorses that she has been improving.  Denies any side effects from medications.  Denies any suicidal thoughts.  Looks forward to the future.  Would like to go home sometime early next week. 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 Depression [F32.9] 01/10/2018  . Dehydration [E86.0] 01/10/2018  . Anorexia [R63.0] 01/10/2018  . Schizophrenia ([place]) [F20.9] 12/31/2017  . Social anxiety disorder [F40.10] 08/02/2016  . 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 Bipolar disorder ([place])   . Hypertension   . Migraine 07/13/2016  . Schizophrenia ([place])    History reviewed. No pertinent surgical history. Family History:  Family History  Problem Relation Age of Onset  . Mental illness Neg Hx    Family Psychiatric  History: See previous notes Social History:  Social History   Substance and Sexual Activity  Alcohol Use No     Social History   Substance and Sexual Activity  Drug Use No    Social History   Socioeconomic History  . Marital status: Single    Spouse name: Not on file  . Number of children: Not on file  . Years of education: Not on file  . Highest education level: Not on file  Occupational History  . Not on file  Social Needs  . Financial resource strain: Not on file  . Food insecurity:    Worry: Not on file    Inability: Not on file  . Transportation needs:    Medical: Not on file    Non-medical: Not on file  Tobacco Use  . Smoking status: Smoker, Current Status Unknown  . Smokeless tobacco: Never Used  Substance and Sexual Activity  .  Alcohol use: No  . Drug use: No  . Sexual activity: Never  Lifestyle  . Physical activity:    Days per week: Not on file    Minutes per session: Not on file  . Stress: Not on file  Relationships  . Social connections:    Talks on phone: Not on file    Gets together: Not on file    Attends religious service: Not on file    Active member of club or organization: Not on file    Attends meetings of clubs or organizations: Not on file    Relationship status: Not on file  Other Topics Concern  . Not on file  Social History Narrative  . Not on file   Additional Social History:                         Sleep: Fair  Appetite:  Fair  Current Medications: Current Facility-Administered Medications  Medication Dose Route Frequency Provider Last Rate Last Dose  . acetaminophen (TYLENOL) tablet 650 mg  650 mg Oral Q6H PRN Clapacs, Madie Reno, MD   650 mg at 01/22/18 1331  . alum & mag hydroxide-simeth (MAALOX/MYLANTA) 200-200-20 MG/5ML suspension 30 mL  30 mL Oral Q4H PRN Clapacs, John T, MD      . feeding supplement (ENSURE ENLIVE) (ENSURE ENLIVE) liquid 237 mL  237 mL Oral TID BM Clapacs, Madie Reno, MD  237 mL at 01/27/18 2005  . FLUoxetine (PROZAC) capsule 20 mg  20 mg Oral Daily Clapacs, John T, MD   20 mg at 01/28/18 0258  . hydrOXYzine (ATARAX/VISTARIL) tablet 50 mg  50 mg Oral Q6H PRN Clapacs, Madie Reno, MD      . hyoscyamine (ANASPAZ) disintergrating tablet 0.125 mg  0.125 mg Oral Q6H PRN Clapacs, John T, MD      . magnesium hydroxide (MILK OF MAGNESIA) suspension 30 mL  30 mL Oral Daily PRN Clapacs, John T, MD      . OLANZapine zydis (ZYPREXA) disintegrating tablet 15 mg  15 mg Oral QHS Clapacs, Madie Reno, MD   15 mg at 01/27/18 2134  . traZODone (DESYREL) tablet 100 mg  100 mg Oral QHS PRN Clapacs, Madie Reno, MD   100 mg at 01/27/18 2134    Lab Results:  No results found for this or any previous visit (from the past 48 hour(s)).  Blood Alcohol level:  Lab Results  Component Value  Date   ETH <10 12/29/2017   ETH <5 52/77/8242    Metabolic Disorder Labs: Lab Results  Component Value Date   HGBA1C 5.4 01/31/2016   MPG 108 01/31/2016   Lab Results  Component Value Date   PROLACTIN 124.2 (H) 01/31/2016   Lab Results  Component Value Date   CHOL 152 01/31/2016   TRIG 147 01/31/2016   HDL 39 (L) 01/31/2016   CHOLHDL 3.9 01/31/2016   VLDL 29 01/31/2016   LDLCALC 84 01/31/2016    Physical Findings: AIMS:  , ,  ,  ,    CIWA:    COWS:     Musculoskeletal: Strength & Muscle Tone: within normal limits Gait & Station: normal Patient leans: N/A  Psychiatric Specialty Exam: Physical Exam  Nursing note and vitals reviewed. Constitutional: She appears well-developed and well-nourished.  HENT:  Head: Normocephalic and atraumatic.  Eyes: Pupils are equal, round, and reactive to light. Conjunctivae are normal.  Neck: Normal range of motion.  Cardiovascular: Regular rhythm and normal heart sounds.  Respiratory: Effort normal. No respiratory distress.  GI: Soft.  Musculoskeletal: Normal range of motion.  Neurological: She is alert.  Skin: Skin is warm and dry.  Psychiatric: She has a normal mood and affect. Her speech is normal and behavior is normal. Judgment and thought content normal. She exhibits abnormal recent memory.    Review of Systems  Constitutional: Negative.   HENT: Negative.   Eyes: Negative.   Respiratory: Negative.   Cardiovascular: Negative.   Gastrointestinal: Negative.   Musculoskeletal: Negative.   Skin: Negative.   Neurological: Negative.   Psychiatric/Behavioral: Negative.     Blood pressure 135/83, pulse 73, temperature 98 F (36.7 C), resp. rate 18, height 5\' 3"  (1.6 m), weight 90.7 kg, last menstrual period 01/21/2018, SpO2 100 %.Body mass index is 35.43 kg/m.  General Appearance: Fairly Groomed  Eye Contact:  Good  Speech:  Clear and Coherent  Volume:  Normal  Mood:  Euthymic  Affect:  Congruent  Thought Process:   Goal Directed  Orientation:  Full (Time, Place, and Person)  Thought Content:  Logical  Suicidal Thoughts:  No  Homicidal Thoughts:  No  Memory:  Immediate;   Fair Recent;   Fair Remote;   Fair  Judgement:  Fair  Insight:  Fair  Psychomotor Activity:  Normal  Concentration:  Concentration: Fair  Recall:  [employer] of Knowledge:  Fair  Language:  Fair  Akathisia:  No  Handed:  Right  AIMS (if indicated):     Assets:  Desire for Improvement  ADL's:  Intact  Cognition:  Impaired,  Mild  Sleep:  Number of Hours: 7.5     Treatment Plan Summary: 42 year old female who presented with MDD, 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  Has received ECT and is continuing to improve.  Primary team hopes to discharge patient early next week.  Continue olanzapine 15 mg daily at bedtime, fluoxetine 20 mg daily, trazodone 100 mg at bedtime as needed and hydroxyzine 50 mg every 6 hours as needed. Ramond Dial, MD 01/28/2018, 2:03 PM

## 2018-01-29 LAB — GLUCOSE, CAPILLARY: Glucose-Capillary: 95 mg/dL (ref 70–99)

## 2018-01-29 MED ORDER — OLANZAPINE 15 MG PO TBDP: 15.0000 mg | ORAL_TABLET | Freq: Every day | ORAL | 1 refills | Status: DC

## 2018-01-29 MED ORDER — FLUOXETINE HCL 20 MG PO CAPS: 20.0000 mg | ORAL_CAPSULE | Freq: Every day | ORAL | 1 refills | Status: DC

## 2018-01-29 NOTE — [facility] Suicide Risk Assessment (Signed)
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 Depression [F32.9] 01/10/2018  . Dehydration [E86.0] 01/10/2018  . Anorexia [R63.0] 01/10/2018  . Schizophrenia ([place]) [F20.9] 12/31/2017  . Social anxiety disorder [F40.10] 08/02/2016  . 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   HENT: Negative.   Eyes: Negative.   Respiratory: Negative.   Cardiovascular: Negative.   Gastrointestinal: Negative.   Musculoskeletal: Negative.   Skin: Negative.   Neurological: Negative.   Psychiatric/Behavioral: Negative.     Blood pressure 132/77, pulse (!) 105, temperature 98.2 F (36.8 C), temperature source Oral, resp. rate 18, height 5\' 3"  (1.6 m), weight 90.7 kg, last menstrual period 01/21/2018, SpO2 100 %.Body mass index is 35.43 kg/m.  General Appearance: Fairly Groomed  Engineer, water::  Good  Speech:  Normal Rate409  Volume:  Decreased  Mood:  Euthymic  Affect:  Constricted  Thought Process:  Goal Directed  Orientation:  Full (Time, Place, and Person)  Thought Content:  Logical  Suicidal Thoughts:  No  Homicidal Thoughts:  No  Memory:  Immediate;   Fair Recent;   Fair Remote;   Fair  Judgement:  Fair  Insight:  Fair  Psychomotor Activity:  Decreased  Concentration:  Fair  Recall:  [employer] of Knowledge:Fair  Language: Fair  Akathisia:  No  Handed:  Right  AIMS (if indicated):     Assets:  Desire for Improvement  Sleep:  Number of Hours: 7.15  Cognition: WNL  ADL's:  Intact   Mental Status Per Nursing Assessment::   On Admission:  NA  Demographic Factors:  Unemployed  Loss Factors: Financial  problems/change in socioeconomic status  Historical Factors: Impulsivity  Risk Reduction Factors:   Sense of responsibility to family, Religious beliefs about death, Living with another person, especially a relative, Positive social support and Positive therapeutic relationship  Continued Clinical Symptoms:  Depression:   Anhedonia  Cognitive Features That Contribute To Risk:  Loss of executive function    Suicide Risk:  Minimal: No identifiable suicidal ideation.  Patients presenting with no risk factors but with morbid ruminations; may be classified as minimal risk based on the severity of the depressive symptoms  Follow-up [facility], [place]. Go on 01/31/2018.   Why:  Please follow up on Wednesday January 31, 2018 at 11am for your hospital follow up appointment.  Please bring your hospital d/c paperwork, identification, insurance card and any medications. Thank you. Contact information: [place] [address] [phone]           Plan Of Care/Follow-up recommendations:  Activity:  as tolerated Diet:  regular Other:  follow up outpt psych treatment  [name], MD 01/29/2018, 12:23 PM

## 2018-01-29 NOTE — Plan of Care (Signed)
Pt. Verbalizes positive feelings this evening, reports having a good day. Pt. Needs reinforcement on provided education. Pt. Denies SI/HI and verbally is able to contract for safety. Pt. Reports anxiety and depression improved, "3/10" and "4/10".    Problem: Education: Goal: Emotional status will improve Outcome: Progressing Goal: Mental status will improve Outcome: Progressing   Problem: Coping: Goal: Will verbalize feelings Outcome: Progressing   Problem: Safety: Goal: Ability to remain free from injury will improve Outcome: Progressing   Problem: Education: Goal: Knowledge of [facility] Education information/materials will improve Outcome: Not Progressing

## 2018-01-29 NOTE — Progress Notes (Signed)
Recreation Therapy Notes  INPATIENT RECREATION TR PLAN  [name] MRN: [id number] DOB: [birth year] Today's Date: 01/29/2018  Rec Therapy Plan Is patient appropriate for Therapeutic Recreation?: Yes Treatment times per week: at least 3 Estimated Length of Stay: 5-7 days TR Treatment/Interventions: Group participation (Comment)  Discharge Criteria Pt will be discharged from therapy if:: Discharged Treatment plan/goals/alternatives discussed and agreed upon by:: Patient/family  Discharge Summary Short term goals set: Patient will engage in groups without prompting or encouragement from LRT x3 group sessions within 5 recreation therapy group sessions Short term goals met: Not met Reason goals not met: Patient did not attend any groups Therapeutic equipment acquired: N/A Reason patient discharged from therapy: Discharge from hospital Pt/family agrees with progress & goals achieved: Yes Date patient discharged from therapy: 01/29/18   Silena Wyss 01/29/2018, 4:33 PM

## 2018-01-29 NOTE — Progress Notes (Signed)
  [facility] Adult Case Management Discharge Plan :  Will you be returning to the same living situation after discharge:  Yes,  Home with family At discharge, do you have transportation home?: Yes,  Family will come at [place] you have the ability to pay for your medications: Yes,  Medicaid  Release of information consent forms completed and in the chart;  Patient's signature needed at discharge.  Patient to Follow up at: [place], [facility] [place]. Go on 01/31/2018.   Why:  Please follow up on Wednesday January 31, 2018 at 11am for your hospital follow up appointment.  Please bring your hospital d/c paperwork, identification, insurance card and any medications. Thank you. Contact information: [place] [address] [phone]           Next level of care provider has access to [place] and Suicide Prevention discussed: Yes,  Patients Sister-in-law, Mrs. Durene Fruits, [phone]   Have you used any form of tobacco in the last 30 days? (Cigarettes, Smokeless Tobacco, Cigars, and/or Pipes): No  Has patient been referred to the Quitline?: N/A patient is not a smoker  Patient has been referred for addiction treatment: [place], LCSW 01/29/2018, 1:35 PM

## 2018-01-29 NOTE — Progress Notes (Signed)
D: Patient is aware of  Discharge this shift .Patient denies suicidal /homicidal ideations. Patient received all belongings brought in  A: No Storage medications. Writer reviewed Discharge Summary, Suicide Risk Assessment, and Transitional Record. Patient also received Prescriptions   from  MD. A 7 day supply of medications given to [place] follow up appointment . R: Patient left unit with no questions  Or concerns  With family

## 2018-01-29 NOTE — Progress Notes (Signed)
Recreation Therapy Notes  Date: 01/29/2018  Time: 9:30 pm   Location: [facility]   Behavioral response: N/A   Intervention Topic: Communication  Discussion/Intervention: Patient did not attend group.   Clinical Observations/Feedback:  Patient did not attend group.   Colden Samaras LRT/CTRS         Ramone Gander 01/29/2018 11:49 AM

## 2018-01-29 NOTE — Progress Notes (Signed)
D: Pt denies SI/HI/AVH. Pt is pleasant and cooperative. Pt. has no Complaints, engages well.  Patient Interaction appropriate. Pt. Verbalizes positive feelings this evening, reports having a good day. Pt. Reports anxiety and depression improved, "3/10" and "4/10". No other notable observations.  A: Q x 15 minute observation checks were completed for safety. Patient was provided with education, but needs reinforcement.  Patient was given/offered medications per orders. Patient  was encourage to attend groups, participate in unit activities and continue with plan of care. Pt. Chart and plans of care reviewed. Pt. Given support and encouragement.   R: Patient is complaint with medication and unit procedures. Pt. Blood sugars monitored per pre-ECT. Pt. NPO after midnight for ECT. Pt. Attends snacks and groups. Pt. Eating good and sleeping good.              Precautionary checks every 15 minutes for safety maintained, room free of safety hazards, patient sustains no injury or falls during this shift. Will endorse care to next shift.

## 2018-01-31 ENCOUNTER — Telehealth: Payer: Self-pay | Admitting: *Deleted

## 2018-02-09 NOTE — Discharge Summary (Signed)
Physician Discharge Summary Note  Patient:  Isabella [name] is an 42 y.o., female MRN:  [id number] DOB:  [birth year] Patient phone:  [phone] (home)  Patient address:   [address] 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 Depression [F32.9] 01/10/2018  . Dehydration [E86.0] 01/10/2018  . Anorexia [R63.0] 01/10/2018  . Schizophrenia ([place]) [F20.9] 12/31/2017  . Social anxiety disorder [F40.10] 08/02/2016  . 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  Frequently catatonic.  Diagnosis had been given for schizophrenia.  Had not shown completely response to medication and had not been maintaining stability outside of the hospital.  No history of suicide attempts.  Past Medical History:  Past Medical History:  Diagnosis Date  . Bipolar disorder ([place])   . Hypertension   . Migraine 07/13/2016  . Schizophrenia ([place])    History reviewed. No pertinent surgical history. Family History:  Family History  Problem Relation Age of Onset  . Mental illness Neg Hx    Family Psychiatric  History: None Social History:  Social History   Substance and Sexual Activity  Alcohol Use No     Social History   Substance and Sexual Activity  Drug Use No    Social History   Socioeconomic History  . Marital status: Single    Spouse name: Not on file  . Number of children: Not on file  . Years of education: Not on file  . Highest education level: Not on file  Occupational History  . Not on file  Social Needs  .  Financial resource strain: Not on file  . Food insecurity:    Worry: Not on file    Inability: Not on file  . Transportation needs:    Medical: Not on file    Non-medical: Not on file  Tobacco Use  . Smoking status: Smoker, Current Status Unknown  . Smokeless tobacco: Never Used  Substance and Sexual Activity  . Alcohol use: No  . Drug use: No  . Sexual activity: Never  Lifestyle  . Physical activity:    Days per week: Not on file    Minutes per session: Not on file  . Stress: Not on file  Relationships  . Social connections:    Talks on phone: Not on file    Gets together: Not on file    Attends religious service: Not on file    Active member of club or organization: Not on file    Attends meetings of clubs or organizations: Not on file    Relationship status: Not on file  Other Topics Concern  . Not on file  Social History Narrative  . Not on file    Hospital Course: Patient was admitted to the psychiatric ward.  She cooperated with evaluation although she was extremely passive and limited in her interactivity.  Patient appeared to be having psychotic symptoms and to be profoundly depressed.  I am not certain if I would have definitely said she had catatonia but she certainly was very flat and withdrawn.  Not eating very much.  Would not even get out  of bed to speak to her parents initially.  Patient was not initially capable to agreed to ECT treatment so her parents were brought into the hospital and were presented with information about ECT and the recommendation that we proceed.  Parents agreed to sign consent forms and we did proceed with ECT and the patient ultimately had 7 treatments bilateral in the hospital.  He showed substantial improvement beginning after about the third treatment.  By the time of discharge the patient's hygiene had completely improved, she was getting up dressing herself attending groups having normal conversations with family and staff.  At no time did  she engage in any dangerous behavior.  She was treated with antipsychotic and antidepressant medicine during the hospitalization as well.  Patient was discharged with a plan to [facility] and continue medicine management  Physical Findings: AIMS:  , ,  ,  ,    CIWA:    COWS:     Musculoskeletal: Strength & Muscle Tone: within normal limits Gait & Station: normal Patient leans: N/A  Psychiatric Specialty Exam: Physical Exam  Nursing note and vitals reviewed. Constitutional: She appears well-developed and well-nourished.  HENT:  Head: Normocephalic and atraumatic.  Eyes: Pupils are equal, round, and reactive to light. Conjunctivae are normal.  Neck: Normal range of motion.  Cardiovascular: Normal heart sounds.  Respiratory: Effort normal.  GI: Soft.  Musculoskeletal: Normal range of motion.  Neurological: She is alert.  Skin: Skin is warm and dry.  Psychiatric: Judgment normal. Her affect is blunt. Her speech is delayed. She is slowed. Thought content is not paranoid. She expresses no homicidal and no suicidal ideation. She exhibits abnormal recent memory.    Review of Systems  Constitutional: Negative.   HENT: Negative.   Eyes: Negative.   Respiratory: Negative.   Cardiovascular: Negative.   Gastrointestinal: Negative.   Musculoskeletal: Negative.   Skin: Negative.   Neurological: Negative.   Psychiatric/Behavioral: Negative.     Blood pressure (!) 145/85, pulse 68, temperature 98.2 F (36.8 C), temperature source Oral, resp. rate 18, height 5\' 3"  (1.6 m), weight 90.7 kg, last menstrual period 01/21/2018, SpO2 100 %.Body mass index is 35.43 kg/m.  General Appearance: Casual  Eye Contact:  Good  Speech:  Clear and Coherent  Volume:  Decreased  Mood:  Euthymic  Affect:  Constricted  Thought Process:  Goal Directed  Orientation:  Full (Time, Place, and Person)  Thought Content:  Logical  Suicidal Thoughts:  No  Homicidal Thoughts:  No  Memory:   Immediate;   Fair Recent;   Fair Remote;   Fair  Judgement:  Fair  Insight:  Fair  Psychomotor Activity:  Decreased  Concentration:  Concentration: Fair  Recall:  [employer] of Knowledge:  Fair  Language:  Fair  Akathisia:  No  Handed:  Right  AIMS (if indicated):     Assets:  Desire for Improvement Housing Physical Health Social Support  ADL's:  Intact  Cognition:  WNL  Sleep:  Number of Hours: 7.15     Have you used any form of tobacco in the last 30 days? (Cigarettes, Smokeless Tobacco, Cigars, and/or Pipes): No  Has this patient used any form of tobacco in the last 30 days? (Cigarettes, Smokeless Tobacco, Cigars, and/or Pipes) Yes, No  Blood Alcohol level:  Lab Results  Component Value Date   [facility] <10 12/29/2017   ETH <5 03/09/8526    Metabolic Disorder Labs:  Lab Results  Component Value Date  HGBA1C 5.4 01/31/2016   MPG 108 01/31/2016   Lab Results  Component Value Date   PROLACTIN 124.2 (H) 01/31/2016   Lab Results  Component Value Date   CHOL 152 01/31/2016   TRIG 147 01/31/2016   HDL 39 (L) 01/31/2016   CHOLHDL 3.9 01/31/2016   VLDL 29 01/31/2016   LDLCALC 84 01/31/2016    See Psychiatric Specialty Exam and Suicide Risk Assessment completed by Attending Physician prior to discharge.  Discharge destination:  Home  Is patient on multiple antipsychotic therapies at discharge:  No   Has Patient had three or more failed trials of antipsychotic monotherapy by history:  No  Recommended Plan for Multiple Antipsychotic Therapies: NA  Discharge Instructions    Diet - low sodium heart healthy   Complete by:  As directed    Increase activity slowly   Complete by:  As directed      Allergies as of 01/29/2018   No Known Allergies     Medication List    STOP taking these medications   acetaminophen 325 MG tablet Commonly known as:  TYLENOL   alum & mag hydroxide-simeth 850-277-41 MG/5ML suspension Commonly known as:  MAALOX/MYLANTA    ARIPiprazole 20 MG tablet Commonly known as:  ABILIFY   escitalopram 5 MG tablet Commonly known as:  LEXAPRO   feeding supplement (ENSURE ENLIVE) Liqd   hydrOXYzine 50 MG tablet Commonly known as:  ATARAX/VISTARIL   hyoscyamine 0.125 MG Tbdp disintergrating tablet Commonly known as:  ANASPAZ   magnesium hydroxide 400 MG/5ML suspension Commonly known as:  MILK OF MAGNESIA     TAKE these medications     Indication  FLUoxetine 20 MG capsule Commonly known as:  PROZAC Take 1 capsule (20 mg total) by mouth daily.  Indication:  Depression   olanzapine zydis 15 MG disintegrating tablet Commonly known as:  ZYPREXA Take 1 tablet (15 mg total) by mouth at bedtime. What changed:    medication strength  how much to take  when to take this  reasons to take this  Indication:  Major Depressive Disorder   traZODone 100 MG tablet Commonly known as:  DESYREL Take 1 tablet (100 mg total) by mouth at bedtime as needed for sleep.  Indication:  Anxiety Disorder, [place], [facility]. Go on 01/31/2018.   Why:  Please follow up on Wednesday January 31, 2018 at 11am for your hospital follow up appointment.  Please bring your hospital d/c paperwork, identification, insurance card and any medications. Thank you. Contact information: [place] [address] [phone]           Follow-up recommendations:  Activity:  Has tolerated Diet:  Regular Other:  [facility] with medication and management and therapy in the community  Comments: Patient circumstances did not allow for outpatient ECT but she showed a dramatic improvement with treatment in the hospital and can be managed outpatient at this point with medication and therapy.  Signed: [name], MD 02/09/2018, 4:50 PM

## 2020-07-19 ENCOUNTER — Encounter: Payer: Self-pay | Admitting: Intensive Care

## 2020-07-19 ENCOUNTER — Emergency Department
Admission: EM | Admit: 2020-07-19 | Discharge: 2020-07-20 | Disposition: A | Discharge status: Home / Self Care | Payer: Medicaid Other | Attending: Emergency Medicine | Admitting: Emergency Medicine

## 2020-07-19 ENCOUNTER — Other Ambulatory Visit: Payer: Self-pay

## 2020-07-19 DIAGNOSIS — Z20822 Contact with and (suspected) exposure to covid-19: Secondary | ICD-10-CM | POA: Insufficient documentation

## 2020-07-19 DIAGNOSIS — F202 Catatonic schizophrenia: Secondary | ICD-10-CM | POA: Diagnosis present

## 2020-07-19 DIAGNOSIS — F4010 Social phobia, unspecified: Secondary | ICD-10-CM | POA: Diagnosis not present

## 2020-07-19 DIAGNOSIS — R109 Unspecified abdominal pain: Secondary | ICD-10-CM

## 2020-07-19 DIAGNOSIS — R630 Anorexia: Secondary | ICD-10-CM | POA: Diagnosis not present

## 2020-07-19 DIAGNOSIS — F17210 Nicotine dependence, cigarettes, uncomplicated: Secondary | ICD-10-CM | POA: Diagnosis not present

## 2020-07-19 DIAGNOSIS — F32A Depression, unspecified: Secondary | ICD-10-CM | POA: Diagnosis not present

## 2020-07-19 DIAGNOSIS — I10 Essential (primary) hypertension: Secondary | ICD-10-CM | POA: Diagnosis not present

## 2020-07-19 DIAGNOSIS — Z046 Encounter for general psychiatric examination, requested by authority: Secondary | ICD-10-CM | POA: Diagnosis present

## 2020-07-19 LAB — CBC
HCT: 53 % — ABNORMAL HIGH (ref 36.0–46.0)
Hemoglobin: 17.7 g/dL — ABNORMAL HIGH (ref 12.0–15.0)
MCH: 31.3 pg (ref 26.0–34.0)
MCHC: 33.4 g/dL (ref 30.0–36.0)
MCV: 93.8 fL (ref 80.0–100.0)
Platelets: 388 10*3/uL (ref 150–400)
RBC: 5.65 MIL/uL — ABNORMAL HIGH (ref 3.87–5.11)
RDW: 12.1 % (ref 11.5–15.5)
WBC: 14.5 10*3/uL — ABNORMAL HIGH (ref 4.0–10.5)
nRBC: 0 % (ref 0.0–0.2)

## 2020-07-19 LAB — COMPREHENSIVE METABOLIC PANEL
ALT: 65 U/L — ABNORMAL HIGH (ref 0–44)
AST: 68 U/L — ABNORMAL HIGH (ref 15–41)
Albumin: 4.7 g/dL (ref 3.5–5.0)
Alkaline Phosphatase: 86 U/L (ref 38–126)
Anion gap: 14 (ref 5–15)
BUN: 29 mg/dL — ABNORMAL HIGH (ref 6–20)
CO2: 17 mmol/L — ABNORMAL LOW (ref 22–32)
Calcium: 9.8 mg/dL (ref 8.9–10.3)
Chloride: 108 mmol/L (ref 98–111)
Creatinine, Ser: 0.83 mg/dL (ref 0.44–1.00)
GFR, Estimated: >60 mL/min (ref >60)
Glucose, Bld: 127 mg/dL — ABNORMAL HIGH (ref 70–99)
Potassium: 4.1 mmol/L (ref 3.5–5.1)
Sodium: 139 mmol/L (ref 135–145)
Total Bilirubin: 1.6 mg/dL — ABNORMAL HIGH (ref 0.3–1.2)
Total Protein: 9.2 g/dL — ABNORMAL HIGH (ref 6.5–8.1)

## 2020-07-19 LAB — ETHANOL: Alcohol, Ethyl (B): <10 mg/dL (ref <10)

## 2020-07-19 LAB — SALICYLATE LEVEL: Salicylate Lvl: <7 mg/dL — ABNORMAL LOW (ref 7.0–30.0)

## 2020-07-19 LAB — RESP PANEL BY RT-PCR (FLU A&B, COVID) ARPGX2
Influenza A by PCR: NEGATIVE
Influenza B by PCR: NEGATIVE
SARS Coronavirus 2 by RT PCR: NEGATIVE

## 2020-07-19 MED ORDER — OLANZAPINE 5 MG PO TABS
15.0000 mg | ORAL_TABLET | Freq: Every day | ORAL | Status: DC
  Filled 2020-07-19 (×2): qty 1

## 2020-07-19 MED ORDER — FLUOXETINE HCL 20 MG PO CAPS
20.0000 mg | ORAL_CAPSULE | Freq: Every day | ORAL | Status: DC
Start: 2020-07-20 — End: 2020-07-20
  Filled 2020-07-19: qty 1

## 2020-07-19 MED ORDER — TRAZODONE HCL 100 MG PO TABS
100.0000 mg | ORAL_TABLET | Freq: Every evening | ORAL | Status: DC | PRN
  Filled 2020-07-19: qty 1

## 2020-07-19 NOTE — ED Notes (Signed)
Hourly rounding reveals patient in room. No complaints, stable, in no acute distress. Q15 minute rounds and monitoring via [employer] to continue. 

## 2020-07-19 NOTE — ED Provider Notes (Signed)
[facility] Emergency Department Provider Note  Time seen: 4:19 PM  I have reviewed the triage vital signs and the nursing notes.   HISTORY  Chief Complaint Depression   HPI Isabella [name] is a 45 y.o. female a past medical history of bipolar, hypertension, schizophrenia, presents to the emergency department for evaluation.  Here the patient is very soft-spoken, Spanish interpreter used for this evaluation.  When asked why she is here she states I do not know.  When asked what we can do for her she states I do not know.  When asked if she is having any medical complaints such as chest pain or abdominal pain, patient denies.  Throughout the entire evaluation patient sitting upright on the side of the bed does not make eye contact, answers in very short phrases and does not make any specific complaint.   Past Medical History:  Diagnosis Date  . Bipolar disorder ([place])   . Hypertension   . Migraine 07/13/2016  . Schizophrenia [facility])     Patient Active Problem List   Diagnosis Date Noted  . Depression 01/10/2018  . Dehydration 01/10/2018  . Anorexia 01/10/2018  . Schizophrenia ([place]) 12/31/2017  . Social anxiety disorder 08/02/2016  . 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 No pertinent surgical history.  Prior to Admission medications   Medication Sig Start Date End Date Taking? Authorizing Provider  FLUoxetine (PROZAC) 20 MG capsule Take 1 capsule (20 mg total) by mouth daily. 01/29/18   Clapacs, Madie Reno, MD  olanzapine zydis (ZYPREXA) 15 MG disintegrating tablet Take 1 tablet (15 mg total) by mouth at bedtime. 01/29/18   Clapacs, Madie Reno, MD  traZODone (DESYREL) 100 MG tablet Take 1 tablet (100 mg total) by mouth at bedtime as needed for sleep. 01/26/18   Clapacs, Madie Reno, MD    No Known Allergies  Family History  Problem Relation Age of Onset  . Mental illness Neg Hx     Social History Social History   Tobacco Use   . Smoking status: Current Some Day Smoker    Types: Cigarettes  . Smokeless tobacco: Never Used  Substance Use Topics  . Alcohol use: Yes  . Drug use: No    Review of Systems Constitutional: Negative for fever Cardiovascular: Negative for chest pain. Respiratory: Negative for shortness of breath. Gastrointestinal: Negative for abdominal pain Musculoskeletal: Negative for musculoskeletal complaints Skin: Negative for skin complaints  Neurological: Negative for headache All other ROS negative  ____________________________________________   PHYSICAL EXAM:  VITAL SIGNS: ED Triage Vitals [07/19/20 1416]  Enc Vitals Group     BP (!) 130/100     Pulse Rate (!) 115     Resp 16     Temp 99.4 F (37.4 C)     Temp Source Oral     SpO2 95 %     Weight 165 lb (74.8 kg)     Height 5\' 4"  (1.626 m)     Head Circumference      Peak Flow      Pain Score 0     Pain Loc      Pain Edu?      Excl. in [place]?     Constitutional: Patient is awake and alert sitting on side of the bed looks forward throughout the entire evaluation does not make eye contact.  Flat affect.  Very soft-spoken. Eyes: Normal exam ENT      Head: Normocephalic and atraumatic.  Mouth/Throat: Mucous membranes are moist. Cardiovascular: Normal rate, regular rhythm. Respiratory: Normal respiratory effort without tachypnea nor retractions. Breath sounds are clear  Gastrointestinal: Soft and nontender. No distention.   Musculoskeletal: Nontender with normal range of motion in all extremities.  Neurologic:  Normal speech and language. No gross focal neurologic deficits Skin:  Skin is warm, dry and intact.  Psychiatric: Flat affect, soft spoken  ____________________________________________   INITIAL IMPRESSION / ASSESSMENT AND PLAN / ED COURSE  Pertinent labs & imaging results that were available during my care of the patient were reviewed by me and considered in my medical decision making (see chart for  details).   Patient presents emergency department for evaluation.  Patient has a long psychiatric history.  Currently sitting on the side of the bed looking forward very soft-spoken very short phrase answers.  No specific complaint to why she is here.  Negative review of systems.  Patient's answers no to most questions.  Per triage patient had reported not getting out of bed in several days, this is very likely depression related.  She denies SI or HI.  We will have psychiatry and TTS evaluate.  Lab work pending.  Medical work-up largely nonrevealing. Psychiatric evaluation pending.  Isabella [name] was evaluated in Emergency Department on 07/19/2020 for the symptoms described in the history of present illness. She was evaluated in the context of the global COVID-19 pandemic, which necessitated consideration that the patient might be at risk for infection with the SARS-CoV-2 virus that causes COVID-19. Institutional protocols and algorithms that pertain to the evaluation of patients at risk for COVID-19 are in a state of rapid change based on information released by regulatory bodies including the CDC and federal and state organizations. These policies and algorithms were followed during the patient's care in the ED.  ____________________________________________   FINAL CLINICAL IMPRESSION(S) / ED DIAGNOSES  Depression   Harvest Dark, MD 07/19/20 2251

## 2020-07-19 NOTE — BH Assessment (Signed)
Comprehensive Clinical Assessment (CCA) Screening, Triage and Referral Note  07/19/2020 Isabella [name] [facility] [id number]   Isabella [name] is an 45 y.o female who presents to [facility] ED voluntarily for treatment. Per triage note, Interpreter present during triage. Patient slow to answer questions. Soft spoken. Patient reports she has not been able to get out of bed in four days due to depression. Non complaint with medications per patient.  During TTS assessment pt presents calm, alert, tearful, guarded, and oriented x 4, catatonic but somewhat cooperative, and mood-congruent with affect. The pt does not appear to be responding to internal or external stimuli. Neither is the pt presenting with any delusional thinking. Pt was unable to verify the information provided to triage RN as she is currently responding with "I don't know" to the majority of assessment questions. Pt reports to feel a little depressed and anxious but is currently unable to provide any further information. Pt responded "Yes" to a life changing event occurring but is currently unable to provide further information. Pt reports to live with her parents and things to no be ok at home but reports to feel safe at home. TTS ended assessment due to pt current presentation and guarded behaviors. Per pt's chart, pt has hx of schizophrenia and compliance with medications is undetermined at this time. Per pt's current RN Stephannie Li, Pt's sister Sherrie George [phone]) expressed concern with pt presenting with catatonic behaviors since pt witnessed her father having a stroke 3 weeks ago and lack of caring for herself independently at this time.   TTS attempted to follow up with Pt's sister but was only able to leave a HIPAA compliant voicemail.   Chief Complaint:  Chief Complaint  Patient presents with  . Depression   Visit Diagnosis: Per hx schizophrenia, catatonic type   Patient Reported Information How did you hear about [place]?  Self   Referral name: Self   Referral phone number: No data recorded Whom do you see for routine medical problems? I don't have a doctor   Practice/Facility Name: No data recorded  Practice/Facility Phone Number: No data recorded  Name of Contact: No data recorded  Contact Number: No data recorded  Contact Fax Number: No data recorded  Prescriber Name: No data recorded  Prescriber Address (if known): No data recorded What Is the Reason for Your Visit/Call Today? Pt reports to feel a little depressed and anxious  How Long Has This Been Causing You Problems? 1 wk - 1 month  Have You Recently Been in Any Inpatient Treatment (Hospital/Detox/Crisis Center/28-Day Program)? No   Name/Location of Program/Hospital:No data recorded  How Long Were You There? No data recorded  When Were You Discharged? No data recorded Have You Ever Received Services From [facility] Before? Yes   Who Do You See at [facility]? ED & INPT  Have You Recently Had Any Thoughts About Hurting Yourself? -- (Pt states "I don't Know")   Are You Planning to Commit Suicide/Harm Yourself At This time?  -- (Pt states "I don't Know")  Have you Recently Had Thoughts About [place]? -- (Pt states "I don't Know")   Explanation: No data recorded Have You Used Any Alcohol or Drugs in the Past 24 Hours? -- (Pt states "I don't Know")   How Long Ago Did You Use Drugs or Alcohol?  No data recorded  What Did You Use and How Much? No data recorded What Do You Feel Would Help You the Most Today? Assessment Only  Do  You Currently Have a Therapist/Psychiatrist? -- (Pt states "I don't Know")   Name of Therapist/Psychiatrist: No data recorded  Have You Been Recently Discharged From Any Office Practice or Programs? No   Explanation of Discharge From Practice/Program:  No data recorded    CCA Screening Triage Referral Assessment Type of Contact: Face-to-Face   Is this Initial or Reassessment? No data  recorded  Date Telepsych consult ordered in [facility]:  No data recorded  Time Telepsych consult ordered in [facility]:  No data recorded Patient Reported Information Reviewed? Yes   Patient Left Without Being Seen? No data recorded  Reason for Not Completing Assessment: No data recorded Collateral Involvement: Sherrie George (sister, [phone])  Does Patient Have a [place]? No data recorded  Name and Contact of Legal Guardian:  No data recorded If Minor and Not Living with Parent(s), Who has Custody? n/a  Is CPS involved or ever been involved? -- (UTA)  Is APS involved or ever been involved? -- (UTA)  Patient Determined To Be At Risk for Harm To Self or Others Based on Review of Patient Reported Information or Presenting Complaint? -- (UTA)   Method: No data recorded  Availability of Means: No data recorded  Intent: No data recorded  Notification Required: No data recorded  Additional Information for Danger to Others Potential:  No data recorded  Additional Comments for Danger to Others Potential:  No data recorded  Are There Guns or Other Weapons in Your Home?  No data recorded   Types of Guns/Weapons: No data recorded   Are These Weapons Safely Secured?                              No data recorded   Who Could Verify You Are Able To Have These Secured:    No data recorded Do You Have any Outstanding Charges, Pending Court Dates, Parole/Probation? No data recorded Contacted To Inform of Risk of Harm To Self or Others: No data recorded Location of Assessment: [facility] ED  Does Patient Present under Involuntary Commitment? No   IVC Papers Initial File Date: No data recorded  [place] of Residence: [place]  Patient Currently Receiving the Following Services: Not Receiving Services   Determination of Need: Emergent (2 hours)   Options For Referral: Inpatient Hospitalization; Medication Management; Outpatient Therapy   Shanon Ace, LCSWA

## 2020-07-19 NOTE — ED Triage Notes (Signed)
Interpretor present during triage. Patient slow to answer questions. Soft spoken. Patient reports she has not been able to get out of bed in four days due to depression. Non complaint with medications per patient.

## 2020-07-19 NOTE — ED Notes (Signed)
Patient gave this RN and future RN's and TTS permission via in person spanish translator to contact patient's sister, her mother Arsenio Loader [phone]) needs translator.  Sherrie George [phone] Per patients sister: Patient has been in a catatonic state for 4 days, "she will not eat, drink, lay down, stand up, or use the restroom without having someone go through the motions for her". Sister states "she witnessed our father have a stroke 3 weeks ago, he is now in critical care and has been worsening in depression since".  Patient denies SI/HI during TTS consult and assessment by this RN and EDP. Translator used when speaking with patient.

## 2020-07-19 NOTE — ED Notes (Signed)
Report to include Situation, Background, Assessment, and Recommendations received from [facility]. Patient alert and oriented, warm and dry, in no acute distress. UTA  SI, HI, AVH and pain. Patient is in cationic state. Patient made aware of Q15 minute rounds and security cameras for their safety. Patient instructed to come to me with needs or concerns.

## 2020-07-19 NOTE — ED Notes (Signed)
Patient belongings: two sandals, black and grey spotted pants, red hair band, green towel, and red shirt.

## 2020-07-19 NOTE — BH Assessment (Signed)
Writer received call from patient's sister [phone] who reports "she hasn't been coming out of her room, she won't eat or take a bath." Patient's sister reports that patient has had symptoms since Wednesday "I think it came from when our father had a stroke 3 weeks ago." Patient's sister also reports that patient has been hospitalized in 2019 for depression. Per patient's chart review patient was admitted to [facility] in 2019 and was diagnosed with Schizophrenia, catatonic type.  Disposition pending

## 2020-07-20 ENCOUNTER — Emergency Department: Payer: Medicaid Other

## 2020-07-20 ENCOUNTER — Inpatient Hospital Stay: Admission: RE | Admit: 2020-07-20 | Payer: Medicaid Other | Source: Intra-hospital | Admitting: Behavioral Health

## 2020-07-20 ENCOUNTER — Other Ambulatory Visit: Payer: Self-pay

## 2020-07-20 ENCOUNTER — Encounter: Payer: Self-pay | Admitting: Psychiatry

## 2020-07-20 ENCOUNTER — Inpatient Hospital Stay
Admission: RE | Admit: 2020-07-20 | Discharge: 2020-07-22 | DRG: 885 | Disposition: A | Discharge status: Other Acute Inpatient Hospital | Payer: Medicaid Other | Source: Intra-hospital | Attending: Psychiatry | Admitting: Psychiatry

## 2020-07-20 DIAGNOSIS — F202 Catatonic schizophrenia: Principal | ICD-10-CM | POA: Diagnosis present

## 2020-07-20 DIAGNOSIS — N179 Acute kidney failure, unspecified: Secondary | ICD-10-CM | POA: Diagnosis present

## 2020-07-20 DIAGNOSIS — F17210 Nicotine dependence, cigarettes, uncomplicated: Secondary | ICD-10-CM | POA: Diagnosis present

## 2020-07-20 DIAGNOSIS — Z720 Tobacco use: Secondary | ICD-10-CM | POA: Diagnosis present

## 2020-07-20 DIAGNOSIS — A419 Sepsis, unspecified organism: Secondary | ICD-10-CM | POA: Diagnosis not present

## 2020-07-20 DIAGNOSIS — R6510 Systemic inflammatory response syndrome (SIRS) of non-infectious origin without acute organ dysfunction: Secondary | ICD-10-CM | POA: Diagnosis present

## 2020-07-20 DIAGNOSIS — I10 Essential (primary) hypertension: Secondary | ICD-10-CM | POA: Diagnosis present

## 2020-07-20 DIAGNOSIS — R945 Abnormal results of liver function studies: Secondary | ICD-10-CM | POA: Diagnosis not present

## 2020-07-20 DIAGNOSIS — F4010 Social phobia, unspecified: Secondary | ICD-10-CM | POA: Diagnosis present

## 2020-07-20 DIAGNOSIS — R509 Fever, unspecified: Secondary | ICD-10-CM

## 2020-07-20 DIAGNOSIS — F061 Catatonic disorder due to known physiological condition: Secondary | ICD-10-CM | POA: Diagnosis present

## 2020-07-20 DIAGNOSIS — R6520 Severe sepsis without septic shock: Secondary | ICD-10-CM | POA: Diagnosis not present

## 2020-07-20 DIAGNOSIS — R7989 Other specified abnormal findings of blood chemistry: Secondary | ICD-10-CM | POA: Diagnosis present

## 2020-07-20 LAB — LIPID PANEL
Cholesterol: 191 mg/dL (ref 0–200)
HDL: 39 mg/dL — ABNORMAL LOW (ref >40)
LDL Cholesterol: 124 mg/dL — ABNORMAL HIGH (ref 0–99)
Total CHOL/HDL Ratio: 4.9 RATIO
Triglycerides: 141 mg/dL (ref <150)
VLDL: 28 mg/dL (ref 0–40)

## 2020-07-20 LAB — HEMOGLOBIN A1C
Hgb A1c MFr Bld: 5.3 % (ref 4.8–5.6)
Mean Plasma Glucose: 105.41 mg/dL

## 2020-07-20 MED ORDER — ACETAMINOPHEN 325 MG PO TABS: 650.0000 mg | ORAL_TABLET | Freq: Four times a day (QID) | ORAL | Status: DC | PRN

## 2020-07-20 MED ORDER — OLANZAPINE 5 MG PO TBDP
15.0000 mg | ORAL_TABLET | Freq: Once | ORAL | Status: AC
  Administered 2020-07-20: 15 mg via ORAL
  Filled 2020-07-20: qty 1

## 2020-07-20 MED ORDER — ONDANSETRON 4 MG PO TBDP
4.0000 mg | ORAL_TABLET | Freq: Once | ORAL | Status: DC
  Filled 2020-07-20: qty 1

## 2020-07-20 MED ORDER — MAGNESIUM HYDROXIDE 400 MG/5ML PO SUSP: 30.0000 mL | Freq: Every day | ORAL | Status: DC | PRN

## 2020-07-20 MED ORDER — ALUM & MAG HYDROXIDE-SIMETH 200-200-20 MG/5ML PO SUSP: 30.0000 mL | ORAL | Status: DC | PRN

## 2020-07-20 MED ORDER — TRAZODONE HCL 100 MG PO TABS: 100.0000 mg | ORAL_TABLET | Freq: Every evening | ORAL | Status: DC | PRN

## 2020-07-20 MED ORDER — FLUOXETINE HCL 20 MG PO CAPS
20.0000 mg | ORAL_CAPSULE | Freq: Every day | ORAL | Status: DC
  Filled 2020-07-20 (×3): qty 1

## 2020-07-20 MED ORDER — OLANZAPINE 5 MG PO TABS
15.0000 mg | ORAL_TABLET | Freq: Every day | ORAL | Status: DC
  Filled 2020-07-20 (×2): qty 1

## 2020-07-20 NOTE — ED Notes (Signed)
Hourly rounding reveals patient in room. No complaints, stable, in no acute distress. Q15 minute rounds and monitoring via [employer] to continue. 

## 2020-07-20 NOTE — ED Notes (Signed)
Went in spoke with patient trying to encourage her to go to the bathroom and/ or shower explain to patient if she needs  help I would be helping to her ,patient state's I'm okay

## 2020-07-20 NOTE — ED Provider Notes (Signed)
Emergency Medicine Observation Re-evaluation Note  Isabella [name] is a 45 y.o. female, seen on rounds today.  Pt initially presented to the ED for complaints of Depression Currently, the patient is resting without distress.  She denies complaint initially, but utilize [place] interpreter, and with some further conversation she does report she is having a little bit of stomach pain.  She does not really vocalize the location of it too well but seems to suggest is a little discomfort in her right upper abdomen.  She has not eaten much the last day but does report she is starting to feel hungry now. Physical Exam  BP (!) 145/114 (BP Location: Right Arm)   Pulse (!) 117   Temp 98.5 F (36.9 C) (Oral)   Resp 16   Ht 5\' 4"  (1.626 m)   Wt 74.8 kg   SpO2 96%   BMI 28.32 kg/m  Physical Exam General: Resting in bed, on no distress.  She is very soft-spoken, seems a little distant flattened affect. Cardiac: Well-perfused, normal heart rate and pulse, strong right radial Lungs: No distress, speaks in clear sentences without distress but is soft-spoken Psych: Very flat affect Abdomen: Reports mild tenderness to discomfort in the right upper quadrant.  No rebound or guarding.  No pain McBurney's point.  No pain or discomfort in the left side left upper or left lower quadrant.  ED Course / MDM  EKG:    I have reviewed the labs performed to date as well as medications administered while in observation.  Recent changes in the last 24 hours include .  Plan  Current plan is for psychiatric disposition.  Right upper quadrant ultrasound was ordered but patient refuses to evaluation.  At this point, patient has very minimal transaminitis, mild discomfort in the right upper quadrant without fever.  She does not wish for further evaluation including ultrasound at this time.  We will continue to observe patient for any signs of worsening of condition, patient was discussed plan to obtain ultrasound via  Spanish interpreter, and had initially been in agreement but evidently now declined.  Psychiatry team updated on request for ultrasound, patient unwilling to undergo that examination.  Continue to follow clinically.      Delman Kitten, MD 07/20/20 2148

## 2020-07-20 NOTE — BH Assessment (Signed)
Pt is currently under review with [facility] for possible admission tonight.

## 2020-07-20 NOTE — ED Notes (Addendum)
Dinner box and juice given. lunch box is still in same place on bed, unopened, uneaten. Pt also sitting in same place on bed without moving.

## 2020-07-20 NOTE — ED Notes (Signed)
VS not taken, patient asleep 

## 2020-07-20 NOTE — ED Notes (Addendum)
Patient is in catatonic state. She is sitting on the side of the bed and hasn't moved for hours. She didn't eat or drink the whole night. She is not speaking or responding to anyone. EDP notified.

## 2020-07-20 NOTE — BH Assessment (Addendum)
Patient can come down after 8:30pm   Call to give report: [phone]  Patient is to be admitted to [facility] by Dr. Domingo Cocking  Attending Physician will be. Dr. Domingo Cocking   Patient has been assigned to room 312, by [place] Nurse Estill Bamberg, RN.   Intake Paper Work has been signed and placed on patient chart.  ER staff is aware of the admission: 1. Nitchia, ER Secretary  2. Quale, ER MD  3. Gwyndolyn Saxon, Patient's Nurse  4. Elberta Fortis, Patient Access.

## 2020-07-20 NOTE — Tx Team (Signed)
Initial Treatment Plan 07/20/2020 10:36 PM [name] [id number]    PATIENT STRESSORS: Medication change or noncompliance Other: Patient is catatonic    PATIENT STRENGTHS: Motivation for treatment/growth Supportive family/friends   PATIENT IDENTIFIED PROBLEMS: Patient is catatonic                     DISCHARGE CRITERIA:  Improved stabilization in mood, thinking, and/or behavior Motivation to continue treatment in a less acute level of care  PRELIMINARY DISCHARGE PLAN: Outpatient therapy Return to previous living arrangement  PATIENT/FAMILY INVOLVEMENT: This treatment plan has been presented to and reviewed with the patient, Isabella [name]. The patient has been given the opportunity to ask questions and make suggestions.  Mallie Darting, RN 07/20/2020, 10:36 PM

## 2020-07-20 NOTE — ED Notes (Signed)
VOL/pending in-patient psych admit 

## 2020-07-20 NOTE — Consult Note (Signed)
[facility] Consult   Reason for Consult: Consult for 45 year old woman with a history of chronic mental illness brought in with catatonia Referring Physician: Quale Patient Identification: Isabella [name] MRN:  [id number] Principal Diagnosis: Schizophrenia, catatonic type [facility]) Diagnosis:  Principal Problem:   Schizophrenia, catatonic type (Isabella [name]) Active Problems:   Social anxiety disorder   Depression   Anorexia   Total Time spent with patient: 1 hour  Subjective:   Isabella [name] is a 45 y.o. female patient admitted with patient not answering any questions except the very occasional shake of her head.  HPI: Patient was interviewed entirely with the assistance of a hospital provided Spanish language interpreter.  Chart reviewed.  45 year old woman with a history of chronic mental illness brought in by family with reports of catatonic-like presentation for at least a week possibly 2 or 3 weeks.  Reports that the patient's father had a stroke about 3 weeks ago and this seems to have triggered this downward trend with the patient.  She has become unresponsive not taking care of herself not really eating much not taking medication and not interacting with others.  This is a condition similar to what she has had in the past.  Attempted to interview the patient today.  A few times she appeared to shake her head very slightly no and answer to questions but it did not give much specific information.  All it did was tell me that she was not able to speak right now and had not had anything to eat today.  Patient did not respond to questions about suicidal ideation.  She has been sitting perfectly still in the same position for the last few hours after the last attempt at moving her earlier in the day.  Not eating or drinking.  Not taking medication.  Labs reviewed.  Mild dehydration some elevation of some liver studies.  Unclear to me whether she has been getting any  outpatient mental health treatment.  We last saw her a couple years ago when she had a successful course of electroconvulsive therapy.  Past Psychiatric History: Patient has a history of chronic mental illness.  Looking back in the old chart and her current presentation is still not clear to me if this is more likely to be schizophrenia with catatonic presentation or recurrent psychotic depression.  Patient did respond well and tolerated ECT treatment in the past.  Unknown if there have been any past suicide attempts but there is no evidence that she has tried to hurt her self today.  Risk to Self:   Risk to Others:   Prior Inpatient Therapy:   Prior Outpatient Therapy:    Past Medical History:  Past Medical History:  Diagnosis Date  . Bipolar disorder ([place])   . Hypertension   . Migraine 07/13/2016  . Schizophrenia ([place])    History reviewed. No pertinent surgical history. Family History:  Family History  Problem Relation Age of Onset  . Mental illness Neg Hx    Family Psychiatric  History: Unknown Social History:  Social History   Substance and Sexual Activity  Alcohol Use Yes     Social History   Substance and Sexual Activity  Drug Use No    Social History   Socioeconomic History  . Marital status: Single    Spouse name: Not on file  . Number of children: Not on file  . Years of education: Not on file  . Highest education level: Not on file  Occupational  History  . Not on file  Tobacco Use  . Smoking status: Current Some Day Smoker    Types: Cigarettes  . Smokeless tobacco: Never Used  Substance and Sexual Activity  . Alcohol use: Yes  . Drug use: No  . Sexual activity: Never  Other Topics Concern  . Not on file  Social History Narrative  . Not on file   Social Determinants of Health   Financial Resource Strain: Not on file  Food Insecurity: Not on file  Transportation Needs: Not on file  Physical Activity: Not on file  Stress: Not on file  Social  Connections: Not on file   Additional Social History:    Allergies:  No Known Allergies  Labs:  Results for orders placed or performed during the hospital encounter of 07/19/20 (from the past 48 hour(s))  Comprehensive metabolic panel     Status: Abnormal   Collection Time: 07/19/20  2:23 PM  Result Value Ref Range   Sodium 139 135 - 145 mmol/L   Potassium 4.1 3.5 - 5.1 mmol/L   Chloride 108 98 - 111 mmol/L   CO2 17 (L) 22 - 32 mmol/L   Glucose, Bld 127 (H) 70 - 99 mg/dL    Comment: Glucose reference range applies only to samples taken after fasting for at least 8 hours.   BUN 29 (H) 6 - 20 mg/dL   Creatinine, Ser 0.83 0.44 - 1.00 mg/dL   Calcium 9.8 8.9 - 10.3 mg/dL   Total Protein 9.2 (H) 6.5 - 8.1 g/dL   Albumin 4.7 3.5 - 5.0 g/dL   AST 68 (H) 15 - 41 U/L   ALT 65 (H) 0 - 44 U/L   Alkaline Phosphatase 86 38 - 126 U/L   Total Bilirubin 1.6 (H) 0.3 - 1.2 mg/dL   GFR, Estimated >60 >60 mL/min    Comment: (NOTE) Calculated using the CKD-EPI Creatinine Equation (2021)    Anion gap 14 5 - 15    Comment: Performed at [facility], [address] [place], [place] [address]  cbc     Status: Abnormal   Collection Time: 07/19/20  2:23 PM  Result Value Ref Range   WBC 14.5 (H) 4.0 - 10.5 K/uL   RBC 5.65 (H) 3.87 - 5.11 MIL/uL   Hemoglobin 17.7 (H) 12.0 - 15.0 g/dL   HCT 53.0 (H) 36.0 - 46.0 %   MCV 93.8 80.0 - 100.0 fL   MCH 31.3 26.0 - 34.0 pg   MCHC 33.4 30.0 - 36.0 g/dL   RDW 12.1 11.5 - 15.5 %   Platelets 388 150 - 400 K/uL   nRBC 0.0 0.0 - 0.2 %    Comment: Performed at [facility], [address], [place], [place] [address]  Resp Panel by RT-PCR (Flu A&B, Covid) Nasopharyngeal Swab     Status: None   Collection Time: 07/19/20  3:52 PM   Specimen: Nasopharyngeal Swab; Nasopharyngeal(NP) swabs in vial transport medium  Result Value Ref Range   SARS Coronavirus 2 by RT PCR NEGATIVE NEGATIVE    Comment: (NOTE) SARS-CoV-2 target nucleic acids are NOT  DETECTED.  The SARS-CoV-2 RNA is generally detectable in upper respiratory specimens during the acute phase of infection. The lowest concentration of SARS-CoV-2 viral copies this assay can detect is 138 copies/mL. A negative result does not preclude SARS-Cov-2 infection and should not be used as the sole basis for treatment or other patient management decisions. A negative result may occur with  improper  specimen collection/handling, submission of specimen other than nasopharyngeal swab, presence of viral mutation(s) within the areas targeted by this assay, and inadequate number of viral copies(<138 copies/mL). A negative result must be combined with clinical observations, patient history, and epidemiological information. The expected result is Negative.  Fact Sheet for Patients:  EntrepreneurPulse.com.au  Fact Sheet for Healthcare Providers:  IncredibleEmployment.be  This test is no t yet approved or cleared by the [place] FDA and  has been authorized for detection and/or diagnosis of SARS-CoV-2 by FDA under an Emergency Use Authorization (EUA). This EUA will remain  in effect (meaning this test can be used) for the duration of the COVID-19 declaration under Section 564(b)(1) of the Act, 21 U.S.C.section 360bbb-3(b)(1), unless the authorization is terminated  or revoked sooner.       Influenza A by PCR NEGATIVE NEGATIVE   Influenza B by PCR NEGATIVE NEGATIVE    Comment: (NOTE) The Xpert Xpress SARS-CoV-2/FLU/RSV plus assay is intended as an aid in the diagnosis of influenza from Nasopharyngeal swab specimens and should not be used as a sole basis for treatment. Nasal washings and aspirates are unacceptable for Xpert Xpress SARS-CoV-2/FLU/RSV testing.  Fact Sheet for Patients: EntrepreneurPulse.com.au  Fact Sheet for Healthcare Providers: IncredibleEmployment.be  This test is not yet approved or  cleared by the [place] FDA and has been authorized for detection and/or diagnosis of SARS-CoV-2 by FDA under an Emergency Use Authorization (EUA). This EUA will remain in effect (meaning this test can be used) for the duration of the COVID-19 declaration under Section 564(b)(1) of the Act, 21 U.S.C. section 360bbb-3(b)(1), unless the authorization is terminated or revoked.  Performed at [facility], [address] [place], [place] [address]   Ethanol     Status: None   Collection Time: 07/19/20  4:51 PM  Result Value Ref Range   Alcohol, Ethyl (B) <10 <10 mg/dL    Comment: (NOTE) Lowest detectable limit for serum alcohol is 10 mg/dL.  For medical purposes only. Performed at [facility], [address] [place], [place] [address]   Salicylate level     Status: Abnormal   Collection Time: 07/19/20  4:51 PM  Result Value Ref Range   Salicylate Lvl <0.9 (L) 7.0 - 30.0 mg/dL    Comment: Performed at [facility], [address], [place], [place] [address]    Current Facility-Administered Medications  Medication Dose Route Frequency Provider Last Rate Last Admin  . FLUoxetine (PROZAC) capsule 20 mg  20 mg Oral Daily Ward, Kristen N, DO      . OLANZapine (ZYPREXA) tablet 15 mg  15 mg Oral Daily Ward, Kristen N, DO      . ondansetron (ZOFRAN-ODT) disintegrating tablet 4 mg  4 mg Oral Once Delman Kitten, MD      . traZODone (DESYREL) tablet 100 mg  100 mg Oral QHS PRN Ward, Delice Bison, DO       Current Outpatient Medications  Medication Sig Dispense Refill  . FLUoxetine (PROZAC) 20 MG capsule Take 1 capsule (20 mg total) by mouth daily. (Patient not taking: No sig reported) 30 capsule 1  . OLANZapine (ZYPREXA) 15 MG tablet Take 15 mg by mouth daily. (Patient not taking: No sig reported)    . traZODone (DESYREL) 100 MG tablet Take 1 tablet (100 mg total) by mouth at bedtime as needed for sleep. (Patient not taking: No sig reported) 14 tablet 0     Musculoskeletal: Strength & Muscle Tone: within normal limits  Gait & Station: unsteady Patient leans: N/A  Psychiatric Specialty Exam: Physical Exam Vitals and nursing note reviewed.  Constitutional:      Appearance: She is well-developed and well-nourished.  HENT:     Head: Normocephalic and atraumatic.  Eyes:     Conjunctiva/sclera: Conjunctivae normal.     Pupils: Pupils are equal, round, and reactive to light.  Cardiovascular:     Heart sounds: Normal heart sounds.  Pulmonary:     Effort: Pulmonary effort is normal.  Abdominal:     Palpations: Abdomen is soft.  Musculoskeletal:        General: Normal range of motion.     Cervical back: Normal range of motion.  Skin:    General: Skin is warm and dry.  Neurological:     General: No focal deficit present.     Mental Status: She is alert.  Psychiatric:        Attention and Perception: She is inattentive.        Mood and Affect: Affect is flat.        Speech: She is noncommunicative.        Behavior: Behavior is uncooperative.     Review of Systems  Unable to perform ROS: Patient nonverbal    Blood pressure 132/84, pulse (!) 101, temperature 98.2 F (36.8 C), temperature source Oral, resp. rate 16, height 5\' 4"  (1.626 m), weight 74.8 kg, SpO2 97 %.Body mass index is 28.32 kg/m.  General Appearance: Casual  Eye Contact:  None  Speech:  Negative  Volume:  Decreased  Mood:  Negative  Affect:  Negative  Thought Process:  NA  Orientation:  Negative  Thought Content:  Negative  Suicidal Thoughts:  No  Homicidal Thoughts:  No  Memory:  Negative  Judgement:  Negative  Insight:  Negative  Psychomotor Activity:  Negative  Concentration:  Concentration: Negative  Recall:  Negative  Fund of Knowledge:  Negative  Language:  Negative  Akathisia:  Negative  Handed:  Right  AIMS (if indicated):     Assets:  Housing Physical Health  ADL's:  Impaired  Cognition:  Impaired,  Moderate and Severe  Sleep:         Treatment Plan Summary: Medication management and Plan Psychiatric medicine has been ordered for the patient but she is currently noncompliant.  Plan is for admission to the psychiatric ward.  Orders will be updated and placed.  Patient is likely to need electroconvulsive therapy.  I will have a conversation about this with her sister at the earliest opportunity because the patient at this point appears unable to engage in a conversation to give consent.  Meanwhile continue to monitor p.o. intake and vitals.  Reassess labs as necessary.  Disposition: Recommend psychiatric Inpatient admission when medically cleared. Supportive therapy provided about ongoing stressors.  [name], MD 07/20/2020 3:41 PM

## 2020-07-20 NOTE — ED Notes (Signed)
Via interpreter pt was told that md had ordered a [place] of her abdomen and if she would get in wheelchair NT would take her. Pt made no effort to comply. Asked if she would allow [place] to help her to the wheelchair and actually tried to help her stand. Pt made no effort to stand. Pt eventually whispered that she would do it later.   Pt did same with meds. She did not say she refused, she made no effort to take cup of meds, or drink and just sat on bed doing nothing.

## 2020-07-20 NOTE — Plan of Care (Signed)
Patient new to the unit tonight, hasn't had time to progress  Problem: Education: Goal: Knowledge of [facility] Education information/materials will improve Outcome: Not Progressing Goal: Emotional status will improve Outcome: Not Progressing Goal: Mental status will improve Outcome: Not Progressing Goal: Verbalization of understanding the information provided will improve Outcome: Not Progressing   Problem: Safety: Goal: Periods of time without injury will increase Outcome: Not Progressing   Problem: Education: Goal: Will be free of psychotic symptoms Outcome: Not Progressing Goal: Knowledge of the prescribed therapeutic regimen will improve Outcome: Not Progressing   Problem: Safety: Goal: Ability to redirect hostility and anger into socially appropriate behaviors will improve Outcome: Not Progressing Goal: Ability to remain free from injury will improve Outcome: Not Progressing

## 2020-07-20 NOTE — Progress Notes (Signed)
Patient admitted from [facility], report received from [place], RN. Patient catatonic and unresponsive to assessment questions. Patient given education, support, and encouragement to be active in her treatment plan. Patient didn't have any medications scheduled this evening. Patient oriented to the unit and her room. Patient being monitored Q 15 minutes for safety per unit protocol. Pt remains safe on the unit.

## 2020-07-20 NOTE — BH Assessment (Signed)
TTS was contacted by pt's sister Sherrie George [phone]) and was provided an update on pt's dispositions as well as, pt's current RN's information to inquire about visitation. [place] agreed with pt's disposition and expressed no further concerns at this time.

## 2020-07-21 ENCOUNTER — Other Ambulatory Visit: Payer: Self-pay | Admitting: Psychiatry

## 2020-07-21 DIAGNOSIS — R945 Abnormal results of liver function studies: Secondary | ICD-10-CM

## 2020-07-21 DIAGNOSIS — R7989 Other specified abnormal findings of blood chemistry: Secondary | ICD-10-CM | POA: Diagnosis present

## 2020-07-21 MED ORDER — LORAZEPAM 1 MG PO TABS: 1.0000 mg | ORAL_TABLET | Freq: Once | ORAL | Status: DC

## 2020-07-21 NOTE — [facility] Group Notes (Signed)
LCSW Group Therapy Note     07/21/2020 2:17 PM     Type of Therapy/Topic:  Group Therapy:  Feelings about Diagnosis     Participation Level:  Did Not Attend     Description of Group:   This group will allow patients to explore their thoughts and feelings about diagnoses they have received. Patients will be guided to explore their level of understanding and acceptance of these diagnoses. Facilitator will encourage patients to process their thoughts and feelings about the reactions of others to their diagnosis and will guide patients in identifying ways to discuss their diagnosis with significant others in their lives. This group will be process-oriented, with patients participating in exploration of their own experiences, giving and receiving support, and processing challenge from other group members.        Therapeutic Goals:  1.    Patient will demonstrate understanding of diagnosis as evidenced by identifying two or more symptoms of the disorder  2.    Patient will be able to express two feelings regarding the diagnosis  3.    Patient will demonstrate their ability to communicate their needs through discussion and/or role play     Summary of Patient Progress: X  Therapeutic Modalities:   Cognitive Behavioral Therapy  Brief Therapy  Feelings Identification    Sandrina Heaton [place], MSW, LCSW-A  07/21/2020 2:17 PM

## 2020-07-21 NOTE — BHH Counselor (Addendum)
CSW attempted to complete PSA with pt, with interpretor. Pt nodded her head "no" when asked by CSW, via interpretor, if she felt well enough to complete said assessment. CSW will attempt again when pt is feeling well enough to finish.  Nathaneil Feagans [place], MSW, LCSW-A 3/8/202211:27 AM

## 2020-07-21 NOTE — Plan of Care (Signed)
Patient unwilling to answer assessment questions   Problem: Education: Goal: Emotional status will improve Outcome: Not Progressing Goal: Mental status will improve Outcome: Not Progressing

## 2020-07-21 NOTE — Plan of Care (Signed)
Patient continue to be catatonic not made any attempt to change her position. No PO intake even with multiple prompting. Refused medications.Patient just looking at staff with no response.No distress noted. Will continue monitor.

## 2020-07-21 NOTE — Progress Notes (Signed)
Recreation Therapy Notes   Date: 07/21/2020  Time: 9:30 am   Location: Craft room    Behavioral response: N/A   Intervention Topic: Stress Management   Discussion/Intervention: Patient did not attend group.   Clinical Observations/Feedback:  Patient did not attend group.   Winifred Bodiford LRT/CTRS         [place] 07/21/2020 11:30 AM

## 2020-07-21 NOTE — Progress Notes (Signed)
Patient catatonic and whispers " I will get up " for breakfast for multiple attempts. Patient stays in bed in the same position.Refused medications. Will continue prompting patient.

## 2020-07-21 NOTE — Consult Note (Signed)
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 female patient admitted with patient unable to give information.  HPI: See note from yesterday.  45 year old woman presented with several days of catatonia of not eating or drinking not taking medicine unresponsive.  Reassess today on the inpatient unit.  Patient in bed eyes open unmoving not taking oral medicine not eating or drinking not responding.  Spoke with patient's mother on the telephone.  Mother recalls the ECT from 2019.  Discussed the options of treatment and mother is once again given consent for ECT treatment  Past Psychiatric History: Past history of chronic mental illness with good response to ECT previously  Risk to Self:   Risk to Others:   Prior Inpatient Therapy:   Prior Outpatient Therapy:    Past Medical History:  Past Medical History:  Diagnosis Date  . Bipolar disorder ([place])   . Hypertension   . Migraine 07/13/2016  . Schizophrenia ([place])    History reviewed. No pertinent surgical history. Family History:  Family History  Problem Relation Age of Onset  . Mental illness Neg Hx    Family Psychiatric  History: See previous Social History:  Social History   Substance and Sexual Activity  Alcohol Use Yes     Social History   Substance and Sexual Activity  Drug Use No    Social History   Socioeconomic History  . Marital status: Single    Spouse name: Not on file  . Number of children: Not on file  . Years of education:  Not on file  . Highest education level: Not on file  Occupational History  . Not on file  Tobacco Use  . Smoking status: Current Some Day Smoker    Packs/day: 1.00    Types: Cigarettes  . Smokeless tobacco: Never Used  Substance and Sexual Activity  . Alcohol use: Yes  . Drug use: No  . Sexual activity: Never  Other Topics Concern  . Not on file  Social History Narrative  . Not on file   Social Determinants of Health   Financial Resource Strain: Not on file  Food Insecurity: Not on file  Transportation Needs: Not on file  Physical Activity: Not on file  Stress: Not on file  Social Connections: Not on file   Additional Social History:    Allergies:  No Known Allergies  Labs:  Results for orders placed or performed during the hospital encounter of 07/19/20 (from the past 48 hour(s))  Lipid panel     Status: Abnormal   Collection Time: 07/20/20  4:39 PM  Result Value Ref Range   Cholesterol 191 0 - 200 mg/dL   Triglycerides 141 <150 mg/dL   HDL 39 (L) >40 mg/dL   Total CHOL/HDL Ratio 4.9 RATIO   VLDL 28 0 - 40 mg/dL   LDL Cholesterol 124 (H) 0 - 99 mg/dL    Comment:        Total Cholesterol/HDL:CHD Risk Coronary Heart Disease Risk Table  Men   Women  1/2 Average Risk   3.4   3.3  Average Risk       5.0   4.4  2 X Average Risk   9.6   7.1  3 X Average Risk  23.4   11.0        Use the calculated Patient Ratio above and the CHD Risk Table to determine the patient's CHD Risk.        ATP III CLASSIFICATION (LDL):  <100     mg/dL   Optimal  100-129  mg/dL   Near or Above                    Optimal  130-159  mg/dL   Borderline  160-189  mg/dL   High  >190     mg/dL   Very High Performed at [facility], [address] [place], [place] [address]   Hemoglobin A1c     Status: None   Collection Time: 07/20/20  4:39 PM  Result Value Ref Range   Hgb A1c MFr Bld 5.3 4.8 - 5.6 %    Comment: (NOTE) Pre diabetes:           5.7%-6.4%  Diabetes:              >6.4%  Glycemic control for   <7.0% adults with diabetes    Mean Plasma Glucose 105.41 mg/dL    Comment: Performed at [facility] [address], [place], [place] [address]    Current Facility-Administered Medications  Medication Dose Route Frequency Provider Last Rate Last Admin  . acetaminophen (TYLENOL) tablet 650 mg  650 mg Oral Q6H PRN Tonimarie Gritz T, MD      . alum & mag hydroxide-simeth (MAALOX/MYLANTA) 200-200-20 MG/5ML suspension 30 mL  30 mL Oral Q4H PRN Caspar Favila T, MD      . FLUoxetine (PROZAC) capsule 20 mg  20 mg Oral Daily Ksenia Kunz T, MD      . LORazepam (ATIVAN) tablet 1 mg  1 mg Oral Once Salley Scarlet, MD      . magnesium hydroxide (MILK OF MAGNESIA) suspension 30 mL  30 mL Oral Daily PRN Sherida Dobkins T, MD      . OLANZapine (ZYPREXA) tablet 15 mg  15 mg Oral Daily Crystall Donaldson T, MD      . traZODone (DESYREL) tablet 100 mg  100 mg Oral QHS PRN Telisha Zawadzki, Madie Reno, MD        Musculoskeletal: Strength & Muscle Tone: decreased Gait & Station: unable to stand Patient leans: N/A  Psychiatric Specialty Exam: Physical Exam Vitals and nursing note reviewed.  Constitutional:      Appearance: She is well-developed and well-nourished.  HENT:     Head: Normocephalic and atraumatic.  Eyes:     Conjunctiva/sclera: Conjunctivae normal.     Pupils: Pupils are equal, round, and reactive to light.  Cardiovascular:     Heart sounds: Normal heart sounds.  Pulmonary:     Effort: Pulmonary effort is normal.  Abdominal:     Palpations: Abdomen is soft.  Musculoskeletal:        General: Normal range of motion.     Cervical back: Normal range of motion.  Skin:    General: Skin is warm and dry.  Neurological:     Mental Status: She is alert.     Review of Systems  Unable to perform ROS: Patient unresponsive    Blood  pressure 129/88, pulse (!) 104, temperature 98.1 F (36.7 C), temperature source Oral, resp. rate 17,  height 5\' 4"  (1.626 m), weight 74.8 kg, SpO2 98 %.Body mass index is 28.31 kg/m.  General Appearance: Disheveled  Eye Contact:  None  Speech:  Negative  Volume:  Decreased  Mood:  Negative  Affect:  Negative  Thought Process:  NA  Orientation:  Negative  Thought Content:  Negative  Suicidal Thoughts:  No  Homicidal Thoughts:  No  Memory:  Negative  Judgement:  Negative  Insight:  Negative  Psychomotor Activity:  Negative  Concentration:  Concentration: Negative  Recall:  Negative  Fund of Knowledge:  Negative  Language:  Negative  Akathisia:  Negative  Handed:  Right  AIMS (if indicated):     Assets:  Social Support  ADL's:  Impaired  Cognition:  Impaired,  Severe  Sleep:  Number of Hours: 4.45     Treatment Plan Summary: Plan Spoke with mother with the assistance of hospital provided Spanish language interpreter.  Mother will come to the hospital between 930 and 10:00 tomorrow morning to sign consent forms.  Plan will be to start ECT tomorrow and continue index course until patient is well.  Disposition: See note.  Plan to begin ECT  [name], MD 07/21/2020 6:01 PM

## 2020-07-21 NOTE — Progress Notes (Signed)
Patient catatonic and wouldn't answer any assessment questions tonight. Patient presented with snack and as of time of writing this note still hasn't eaten or drank anything. Patient given encouragement. Patient being monitored Q 15 minutes for safety per unit protocol. Pt remains safe on the unit.

## 2020-07-21 NOTE — H&P (Signed)
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 Assessment completed with Isabella [name] #[id number] [place] interpreter. She whispers that she will get up a couple of times, but otherwise does not speak during assessment or answer questions. She nods yes when asked if she is feeling poorly, and if she has done well with ECT. She has been seen previously, and improved with ECT.   Spoke with her sister [phone]: She states that her and her mother are worried about Isabella [name] due to her lack of eating, drinking, or moving. They note this has happened before, and got better with ECT. They want her to start treatment as soon as possible.  She notes that her sister needs assistance moving to get out of bed.   Associated Signs/Symptoms: Depression Symptoms:  weight loss, decreased appetite, Duration of Depression Symptoms: No data recorded (Hypo) Manic Symptoms:  None reported by family Anxiety Symptoms:  Excessive Worry, Psychotic Symptoms:  No reported by family Duration of Psychotic Symptoms: No data recorded PTSD Symptoms: Negative Total Time spent with patient: 1 hour  Past Psychiatric History: Patient has a history of chronic mental illness.  Looking back in the old chart and her current presentation is still unclear and differential includes schizophrenia with catatonic presentation or recurrent psychotic depression.  Patient did respond well  and tolerated ECT treatment in the past.  Unknown if there have been any past suicide attempts but there is no evidence that she has tried to hurt her self today.  Is the patient at risk to self? Yes.    Has the patient been a risk to self in the past 6 months? No.  Has the patient been a risk to self within the distant past? No.  Is the patient a risk to others? No.  Has the patient been a risk to others in the past 6 months? No.  Has the patient been a risk to others within the distant past? No.   Prior Inpatient Therapy:   Prior Outpatient Therapy:    Alcohol Screening: 1. How often do you have a drink containing alcohol?: Never 2. How many drinks containing alcohol do you have on a typical day when you are drinking?: 1 or 2 3. How often do you have six or more drinks on one occasion?: Never AUDIT-C Score: 0 4. How often during the last year have you found that you were not able to stop drinking once you had started?: Never 5. How often during the last year have you failed to do what was normally expected from you because of drinking?: Never 6. How often during the last year have you needed a first drink in the morning to get yourself going after a heavy drinking session?: Never 7. How often during the last year have you had a feeling of guilt of remorse after drinking?: Never 8. How often during the last year have you been unable to remember what happened the night before because you had been drinking?: Never 9. Have you or someone else been injured as  a result of your drinking?: No 10. Has a relative or friend or a doctor or another health worker been concerned about your drinking or suggested you cut down?: No Alcohol Use Disorder Identification Test Final Score (AUDIT): 0 Alcohol Brief Interventions/Follow-up: AUDIT Score <7 follow-up not indicated Substance Abuse History in the last 12 months:  No known substance abuse Consequences of Substance Abuse: Negative Previous  Psychotropic Medications: Yes  Psychological Evaluations: No  Past Medical History:  Past Medical History:  Diagnosis Date  . Bipolar disorder ([place])   . Hypertension   . Migraine 07/13/2016  . Schizophrenia ([place])    History reviewed. No pertinent surgical history. Family History:  Family History  Problem Relation Age of [place]  . Mental illness Neg Hx    Family Psychiatric  History: Unknown Tobacco Screening: Have you used any form of tobacco in the last 30 days? (Cigarettes, Smokeless Tobacco, Cigars, and/or Pipes): Yes Tobacco use, Select all that apply: 5 or more cigarettes per day Are you interested in Tobacco Cessation Medications?: No, patient refused Counseled patient on smoking cessation including recognizing danger situations, developing coping skills and basic information about quitting provided: Refused/Declined practical counseling Social History:  Social History   Substance and Sexual Activity  Alcohol Use Yes     Social History   Substance and Sexual Activity  Drug Use No    Additional Social History:                           Allergies:  No Known Allergies Lab Results:  Results for orders placed or performed during the hospital encounter of 07/19/20 (from the past 48 hour(s))  Comprehensive metabolic panel     Status: Abnormal   Collection Time: 07/19/20  2:23 PM  Result Value Ref Range   Sodium 139 135 - 145 mmol/L   Potassium 4.1 3.5 - 5.1 mmol/L   Chloride 108 98 - 111 mmol/L   CO2 17 (L) 22 - 32 mmol/L   Glucose, Bld 127 (H) 70 - 99 mg/dL    Comment: Glucose reference range applies only to samples taken after fasting for at least 8 hours.   BUN 29 (H) 6 - 20 mg/dL   Creatinine, Ser 0.83 0.44 - 1.00 mg/dL   Calcium 9.8 8.9 - 10.3 mg/dL   Total Protein 9.2 (H) 6.5 - 8.1 g/dL   Albumin 4.7 3.5 - 5.0 g/dL   AST 68 (H) 15 - 41 U/L   ALT 65 (H) 0 - 44 U/L   Alkaline Phosphatase 86 38 - 126 U/L   Total Bilirubin 1.6 (H) 0.3 - 1.2 mg/dL    GFR, Estimated >60 >60 mL/min    Comment: (NOTE) Calculated using the CKD-EPI Creatinine Equation (2021)    Anion gap 14 5 - 15    Comment: Performed at [facility], [address] [place],  [address]  cbc     Status: Abnormal   Collection Time: 07/19/20  2:23 PM  Result Value Ref Range   WBC 14.5 (H) 4.0 - 10.5 K/uL   RBC 5.65 (H) 3.87 - 5.11 MIL/uL   Hemoglobin 17.7 (H) 12.0 - 15.0 g/dL   HCT 53.0 (H) 36.0 - 46.0 %   MCV 93.8 80.0 - 100.0 fL   MCH 31.3 26.0 - 34.0 pg   MCHC 33.4 30.0 - 36.0 g/dL   RDW 12.1 11.5 - 15.5 %   Platelets 388 150 - 400 K/uL  nRBC 0.0 0.0 - 0.2 %    Comment: Performed at [facility], [address] [place], [place] [address]  Resp Panel by RT-PCR (Flu A&B, Covid) Nasopharyngeal Swab     Status: None   Collection Time: 07/19/20  3:52 PM   Specimen: Nasopharyngeal Swab; Nasopharyngeal(NP) swabs in vial transport medium  Result Value Ref Range   SARS Coronavirus 2 by RT PCR NEGATIVE NEGATIVE    Comment: (NOTE) SARS-CoV-2 target nucleic acids are NOT DETECTED.  The SARS-CoV-2 RNA is generally detectable in upper respiratory specimens during the acute phase of infection. The lowest concentration of SARS-CoV-2 viral copies this assay can detect is 138 copies/mL. A negative result does not preclude SARS-Cov-2 infection and should not be used as the sole basis for treatment or other patient management decisions. A negative result may occur with  improper specimen collection/handling, submission of specimen other than nasopharyngeal swab, presence of viral mutation(s) within the areas targeted by this assay, and inadequate number of viral copies(<138 copies/mL). A negative result must be combined with clinical observations, patient history, and epidemiological information. The expected result is Negative.  Fact Sheet for Patients:  EntrepreneurPulse.com.au  Fact Sheet for Healthcare Providers:   IncredibleEmployment.be  This test is no t yet approved or cleared by the [place] FDA and  has been authorized for detection and/or diagnosis of SARS-CoV-2 by FDA under an Emergency Use Authorization (EUA). This EUA will remain  in effect (meaning this test can be used) for the duration of the COVID-19 declaration under Section 564(b)(1) of the Act, 21 U.S.C.section 360bbb-3(b)(1), unless the authorization is terminated  or revoked sooner.       Influenza A by PCR NEGATIVE NEGATIVE   Influenza B by PCR NEGATIVE NEGATIVE    Comment: (NOTE) The Xpert Xpress SARS-CoV-2/FLU/RSV plus assay is intended as an aid in the diagnosis of influenza from Nasopharyngeal swab specimens and should not be used as a sole basis for treatment. Nasal washings and aspirates are unacceptable for Xpert Xpress SARS-CoV-2/FLU/RSV testing.  Fact Sheet for Patients: EntrepreneurPulse.com.au  Fact Sheet for Healthcare Providers: IncredibleEmployment.be  This test is not yet approved or cleared by the [place] FDA and has been authorized for detection and/or diagnosis of SARS-CoV-2 by FDA under an Emergency Use Authorization (EUA). This EUA will remain in effect (meaning this test can be used) for the duration of the COVID-19 declaration under Section 564(b)(1) of the Act, 21 U.S.C. section 360bbb-3(b)(1), unless the authorization is terminated or revoked.  Performed at [facility], [address] [place], [place] [address]   Ethanol     Status: None   Collection Time: 07/19/20  4:51 PM  Result Value Ref Range   Alcohol, Ethyl (B) <10 <10 mg/dL    Comment: (NOTE) Lowest detectable limit for serum alcohol is 10 mg/dL.  For medical purposes only. Performed at [facility], [address] [place], [place] [address]   Salicylate level     Status: Abnormal   Collection Time: 07/19/20  4:51 PM  Result Value Ref  Range   Salicylate Lvl <0.8 (L) 7.0 - 30.0 mg/dL    Comment: Performed at [facility], [address] [place], [place] [address]  Lipid panel     Status: Abnormal   Collection Time: 07/20/20  4:39 PM  Result Value Ref Range   Cholesterol 191 0 - 200 mg/dL   Triglycerides 141 <150 mg/dL   HDL 39 (L) >40 mg/dL   Total CHOL/HDL  Ratio 4.9 RATIO   VLDL 28 0 - 40 mg/dL   LDL Cholesterol 124 (H) 0 - 99 mg/dL    Comment:        Total Cholesterol/HDL:CHD Risk Coronary Heart Disease Risk Table                     Men   Women  1/2 Average Risk   3.4   3.3  Average Risk       5.0   4.4  2 X Average Risk   9.6   7.1  3 X Average Risk  23.4   11.0        Use the calculated Patient Ratio above and the CHD Risk Table to determine the patient's CHD Risk.        ATP III CLASSIFICATION (LDL):  <100     mg/dL   Optimal  100-129  mg/dL   Near or Above                    Optimal  130-159  mg/dL   Borderline  160-189  mg/dL   High  >190     mg/dL   Very High Performed at [facility], [address] [place], [place] [address]   Hemoglobin A1c     Status: None   Collection Time: 07/20/20  4:39 PM  Result Value Ref Range   Hgb A1c MFr Bld 5.3 4.8 - 5.6 %    Comment: (NOTE) Pre diabetes:          5.7%-6.4%  Diabetes:              >6.4%  Glycemic control for   <7.0% adults with diabetes    Mean Plasma Glucose 105.41 mg/dL    Comment: Performed at [facility] [address], [place], [place] [address]    Blood Alcohol level:  Lab Results  Component Value Date   [facility] <10 07/19/2020   ETH <10 71/24/5809    Metabolic Disorder Labs:  Lab Results  Component Value Date   HGBA1C 5.3 07/20/2020   MPG 105.41 07/20/2020   MPG 108 01/31/2016   Lab Results  Component Value Date   PROLACTIN 124.2 (H) 01/31/2016   Lab Results  Component Value Date   CHOL 191 07/20/2020   TRIG 141 07/20/2020   HDL 39 (L) 07/20/2020   CHOLHDL 4.9 07/20/2020   VLDL 28  07/20/2020   LDLCALC 124 (H) 07/20/2020   LDLCALC 84 01/31/2016    Current Medications: Current Facility-Administered Medications  Medication Dose Route Frequency Provider Last Rate Last Admin  . acetaminophen (TYLENOL) tablet 650 mg  650 mg Oral Q6H PRN Clapacs, John T, MD      . alum & mag hydroxide-simeth (MAALOX/MYLANTA) 200-200-20 MG/5ML suspension 30 mL  30 mL Oral Q4H PRN Clapacs, John T, MD      . FLUoxetine (PROZAC) capsule 20 mg  20 mg Oral Daily Clapacs, John T, MD      . magnesium hydroxide (MILK OF MAGNESIA) suspension 30 mL  30 mL Oral Daily PRN Clapacs, John T, MD      . OLANZapine (ZYPREXA) tablet 15 mg  15 mg Oral Daily Clapacs, John T, MD      . traZODone (DESYREL) tablet 100 mg  100 mg Oral QHS PRN Clapacs, Madie Reno, MD       PTA Medications: Medications Prior to Admission  Medication Sig Dispense Refill Last Dose  . FLUoxetine (  PROZAC) 20 MG capsule Take 1 capsule (20 mg total) by mouth daily. (Patient not taking: No sig reported) 30 capsule 1   . OLANZapine (ZYPREXA) 15 MG tablet Take 15 mg by mouth daily. (Patient not taking: No sig reported)     . traZODone (DESYREL) 100 MG tablet Take 1 tablet (100 mg total) by mouth at bedtime as needed for sleep. (Patient not taking: No sig reported) 14 tablet 0     Musculoskeletal: Strength & Muscle Tone: within normal limits Gait & Station: unsteady Patient leans: N/A  Psychiatric Specialty Exam: Physical Exam Vitals and nursing note reviewed.  Constitutional:      Appearance: Normal appearance.  HENT:     Head: Normocephalic and atraumatic.     Right Ear: External ear normal.     Left Ear: External ear normal.     Nose: Nose normal.     Mouth/Throat:     Mouth: Mucous membranes are moist.     Pharynx: Oropharynx is clear.  Eyes:     Extraocular Movements: Extraocular movements intact.     Conjunctiva/sclera: Conjunctivae normal.     Pupils: Pupils are equal, round, and reactive to light.  Cardiovascular:      Rate and Rhythm: Normal rate.     Pulses: Normal pulses.  Pulmonary:     Effort: Pulmonary effort is normal.     Breath sounds: Normal breath sounds.  Abdominal:     General: Abdomen is flat.     Palpations: Abdomen is soft.  Musculoskeletal:        General: No swelling or deformity.     Cervical back: Normal range of motion and neck supple.  Skin:    General: Skin is warm and dry.  Neurological:     General: No focal deficit present.     Mental Status: She is alert.     Cranial Nerves: No cranial nerve deficit.  Psychiatric:        Attention and Perception: She is inattentive.        Speech: She is noncommunicative.        Behavior: Behavior is withdrawn.        Cognition and Memory: Cognition is impaired.     Review of Systems  Unable to perform ROS: Acuity of condition    Blood pressure 129/88, pulse (!) 104, temperature 98.1 F (36.7 C), temperature source Oral, resp. rate 17, height 5\' 4"  (1.626 m), weight 74.8 kg, SpO2 98 %.Body mass index is 28.31 kg/m.  General Appearance: Disheveled  Eye Contact:  Fair  Speech:  Mostly mute, with occasional whisper  Volume:  Decreased  Mood:  Depressed  Affect:  Flat  Thought Process:  NA  Orientation:  Negative  Thought Content:  Negative  Suicidal Thoughts:  Unknown  Homicidal Thoughts:  Unknown  Memory:  Negative  Judgement:  Negative  Insight:  Negative  Psychomotor Activity:  Decreased  Concentration:  Concentration: Negative  Recall:  Negative  Fund of Knowledge:  Negative  Language:  Negative  Akathisia:  Negative  Handed:  Right  AIMS (if indicated):     Assets:  Housing Social Support  ADL's:  Impaired  Cognition:  Impaired,  Severe  Sleep:  Number of Hours: 4.45    Treatment Plan Summary: Daily contact with patient to assess and evaluate symptoms and progress in treatment and Medication management. 45 year old female presenting with catatonia- immobility, mutism, staring , posturing, withdrawal, and  tachycardia. Lack of oral intake has resulted in some  liver dysfunction. Continue Prozac 20 mg daily, olanzapine 15 mg daily. Will start ECT for severe catatonia tmw, NPO after midnight. Will check acute hepatitis panel for elevated LFTs.   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[place]) Long Term Goal(s): Improvement in symptoms so as ready for discharge  Short Term Goals: Ability to identify changes in lifestyle to reduce recurrence of condition will improve, Ability to verbalize feelings will improve, Ability to disclose and discuss suicidal ideas, Ability to demonstrate self-control will improve, Ability to identify and develop effective coping behaviors will improve, Ability to maintain clinical measurements within normal limits will improve and Compliance with prescribed medications will improve  Physician Treatment Plan for Secondary Diagnosis: Principal Problem:   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[place]) Active Problems:   Social anxiety disorder   LFTs abnormal  Long Term Goal(s): Improvement in symptoms so as ready for discharge  Short Term Goals: Ability to identify changes in lifestyle to reduce recurrence of condition will improve, Ability to verbalize feelings will improve, Ability to disclose and discuss suicidal ideas, Ability to demonstrate self-control will improve, Ability to identify and develop effective coping behaviors will improve, Ability to maintain clinical measurements within normal limits will improve and Compliance with prescribed medications will improve  I certify that inpatient services furnished can reasonably be expected to improve the patient's condition.    Salley Scarlet, MD 3/8/20222:08 PM

## 2020-07-21 NOTE — [facility] Suicide Risk Assessment (Signed)
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 Assessment completed with Doroteo Bradford #[id number] [place] interpreter. She whispers that she will get up a couple of times, but otherwise does not speak during assessment or answer questions. She nods yes when asked if she is feeling poorly, and if she has done well with ECT. She has been seen previously, and improved with ECT.   Spoke with her sister [phone]: She states that her and her mother are worried about [place] due to her lack of eating, drinking, or moving. They note this has happened before, and got better with ECT. They want her to start treatment as soon as possible.  She notes that her sister needs assistance moving to get out of bed.   Continued Clinical Symptoms:  Alcohol Use Disorder Identification Test Final Score (AUDIT): 0 The "Alcohol Use Disorders Identification Test", Guidelines for Use in Primary Care, Second Edition.  World Pharmacologist [facility]). Score between 0-7:  no or low risk or alcohol related problems. Score between 8-15:  moderate risk of alcohol related problems. Score between 16-19:  high risk of alcohol related problems. Score 20 or above:  warrants further diagnostic evaluation for alcohol dependence and treatment.   CLINICAL FACTORS:   Schizophrenia:   Paranoid or undifferentiated type Currently Psychotic Previous Psychiatric Diagnoses and  Treatments   Musculoskeletal: Strength & Muscle Tone: within normal limits Gait & Station: unsteady Patient leans: N/A  Psychiatric Specialty Exam: Physical Exam Vitals and nursing note reviewed.  Constitutional:      Appearance: Normal appearance.  HENT:     Head: Normocephalic and atraumatic.     Right Ear: External ear normal.     Left Ear: External ear normal.     Nose: Nose normal.     Mouth/Throat:     Mouth: Mucous membranes are moist.     Pharynx: Oropharynx is clear.  Eyes:     Extraocular Movements: Extraocular movements intact.     Conjunctiva/sclera: Conjunctivae normal.     Pupils: Pupils are equal, round, and reactive to light.  Cardiovascular:     Rate and Rhythm: Normal rate.     Pulses: Normal pulses.  Pulmonary:     Effort: Pulmonary effort is normal.     Breath sounds: Normal breath sounds.  Abdominal:     General: Abdomen is flat.     Palpations: Abdomen is soft.  Musculoskeletal:        General: No swelling or deformity.     Cervical back: Normal range of motion and neck supple.  Skin:    General: Skin is warm and dry.  Neurological:     General: No focal deficit present.     Mental Status: She is alert.     Cranial Nerves: No cranial nerve deficit.  Psychiatric:        Attention and Perception: She is inattentive.        Speech: She is noncommunicative.  Behavior: Behavior is withdrawn.        Cognition and Memory: Cognition is impaired.     Review of Systems  Unable to perform ROS: Acuity of condition    Blood pressure 129/88, pulse (!) 104, temperature 98.1 F (36.7 C), temperature source Oral, resp. rate 17, height 5\' 4"  (1.626 m), weight 74.8 kg, SpO2 98 %.Body mass index is 28.31 kg/m.  General Appearance: Disheveled  Eye Contact:  Fair  Speech:  Mostly mute, with occasional whisper  Volume:  Decreased  Mood:  Depressed  Affect:  Flat  Thought Process:  NA  Orientation:  Negative  Thought Content:  Negative  Suicidal  Thoughts:  Unknown  Homicidal Thoughts:  Unknown  Memory:  Negative  Judgement:  Negative  Insight:  Negative  Psychomotor Activity:  Decreased  Concentration:  Concentration: Negative  Recall:  Negative  Fund of Knowledge:  Negative  Language:  Negative  Akathisia:  Negative  Handed:  Right  AIMS (if indicated):     Assets:  Housing Social Support  ADL's:  Impaired  Cognition:  Impaired,  Severe  Sleep:  Number of Hours: 4.45         COGNITIVE FEATURES THAT CONTRIBUTE TO RISK:  Loss of executive function    SUICIDE RISK:   Mild:  Suicidal ideation of limited frequency, intensity, duration, and specificity.  There are no identifiable plans, no associated intent, mild dysphoria and related symptoms, good self-control (both objective and subjective assessment), few other risk factors, and identifiable protective factors, including available and accessible social support.  PLAN OF CARE: Continue inpatient admission, see H&P for full details   I certify that inpatient services furnished can reasonably be expected to improve the patient's condition.   Salley Scarlet, MD 07/21/2020, 2:10 PM

## 2020-07-22 ENCOUNTER — Encounter: Payer: Self-pay | Admitting: Psychiatry

## 2020-07-22 ENCOUNTER — Inpatient Hospital Stay: Payer: Medicaid Other

## 2020-07-22 ENCOUNTER — Encounter: Payer: Self-pay | Admitting: Internal Medicine

## 2020-07-22 ENCOUNTER — Inpatient Hospital Stay: Payer: Medicaid Other | Admitting: Anesthesiology

## 2020-07-22 ENCOUNTER — Inpatient Hospital Stay
Admission: AD | Admit: 2020-07-22 | Discharge: 2020-08-05 | DRG: 872 | Disposition: A | Discharge status: Home / Self Care | Payer: Medicaid Other | Source: Ambulatory Visit | Attending: Internal Medicine | Admitting: Internal Medicine

## 2020-07-22 DIAGNOSIS — I10 Essential (primary) hypertension: Secondary | ICD-10-CM | POA: Diagnosis present

## 2020-07-22 DIAGNOSIS — R6510 Systemic inflammatory response syndrome (SIRS) of non-infectious origin without acute organ dysfunction: Secondary | ICD-10-CM | POA: Diagnosis present

## 2020-07-22 DIAGNOSIS — E860 Dehydration: Secondary | ICD-10-CM | POA: Diagnosis present

## 2020-07-22 DIAGNOSIS — N179 Acute kidney failure, unspecified: Secondary | ICD-10-CM | POA: Diagnosis present

## 2020-07-22 DIAGNOSIS — G43909 Migraine, unspecified, not intractable, without status migrainosus: Secondary | ICD-10-CM | POA: Diagnosis present

## 2020-07-22 DIAGNOSIS — F061 Catatonic disorder due to known physiological condition: Secondary | ICD-10-CM | POA: Diagnosis not present

## 2020-07-22 DIAGNOSIS — R6520 Severe sepsis without septic shock: Secondary | ICD-10-CM | POA: Diagnosis not present

## 2020-07-22 DIAGNOSIS — Z720 Tobacco use: Secondary | ICD-10-CM | POA: Diagnosis present

## 2020-07-22 DIAGNOSIS — R1033 Periumbilical pain: Secondary | ICD-10-CM | POA: Diagnosis not present

## 2020-07-22 DIAGNOSIS — A419 Sepsis, unspecified organism: Secondary | ICD-10-CM | POA: Diagnosis present

## 2020-07-22 DIAGNOSIS — Z6833 Body mass index (BMI) 33.0-33.9, adult: Secondary | ICD-10-CM | POA: Diagnosis not present

## 2020-07-22 DIAGNOSIS — F4010 Social phobia, unspecified: Secondary | ICD-10-CM | POA: Diagnosis present

## 2020-07-22 DIAGNOSIS — E669 Obesity, unspecified: Secondary | ICD-10-CM | POA: Diagnosis present

## 2020-07-22 DIAGNOSIS — E876 Hypokalemia: Secondary | ICD-10-CM | POA: Diagnosis not present

## 2020-07-22 DIAGNOSIS — F202 Catatonic schizophrenia: Secondary | ICD-10-CM | POA: Diagnosis present

## 2020-07-22 DIAGNOSIS — K760 Fatty (change of) liver, not elsewhere classified: Secondary | ICD-10-CM | POA: Diagnosis present

## 2020-07-22 DIAGNOSIS — R945 Abnormal results of liver function studies: Secondary | ICD-10-CM

## 2020-07-22 DIAGNOSIS — F17210 Nicotine dependence, cigarettes, uncomplicated: Secondary | ICD-10-CM | POA: Diagnosis present

## 2020-07-22 DIAGNOSIS — F32A Depression, unspecified: Secondary | ICD-10-CM | POA: Diagnosis present

## 2020-07-22 DIAGNOSIS — R509 Fever, unspecified: Secondary | ICD-10-CM | POA: Diagnosis present

## 2020-07-22 DIAGNOSIS — N390 Urinary tract infection, site not specified: Secondary | ICD-10-CM | POA: Diagnosis present

## 2020-07-22 DIAGNOSIS — E870 Hyperosmolality and hypernatremia: Secondary | ICD-10-CM | POA: Diagnosis present

## 2020-07-22 DIAGNOSIS — D259 Leiomyoma of uterus, unspecified: Secondary | ICD-10-CM | POA: Diagnosis present

## 2020-07-22 DIAGNOSIS — Z833 Family history of diabetes mellitus: Secondary | ICD-10-CM | POA: Diagnosis not present

## 2020-07-22 DIAGNOSIS — R7989 Other specified abnormal findings of blood chemistry: Secondary | ICD-10-CM

## 2020-07-22 LAB — COMPREHENSIVE METABOLIC PANEL
ALT: 74 U/L — ABNORMAL HIGH (ref 0–44)
AST: 78 U/L — ABNORMAL HIGH (ref 15–41)
Albumin: 4.4 g/dL (ref 3.5–5.0)
Alkaline Phosphatase: 96 U/L (ref 38–126)
Anion gap: 15 (ref 5–15)
BUN: 58 mg/dL — ABNORMAL HIGH (ref 6–20)
CO2: 17 mmol/L — ABNORMAL LOW (ref 22–32)
Calcium: 9.7 mg/dL (ref 8.9–10.3)
Chloride: 115 mmol/L — ABNORMAL HIGH (ref 98–111)
Creatinine, Ser: 1.75 mg/dL — ABNORMAL HIGH (ref 0.44–1.00)
GFR, Estimated: 36 mL/min — ABNORMAL LOW (ref >60)
Glucose, Bld: 144 mg/dL — ABNORMAL HIGH (ref 70–99)
Potassium: 4.1 mmol/L (ref 3.5–5.1)
Sodium: 147 mmol/L — ABNORMAL HIGH (ref 135–145)
Total Bilirubin: 1.8 mg/dL — ABNORMAL HIGH (ref 0.3–1.2)
Total Protein: 8.9 g/dL — ABNORMAL HIGH (ref 6.5–8.1)

## 2020-07-22 LAB — PHOSPHORUS: Phosphorus: 3.6 mg/dL (ref 2.5–4.6)

## 2020-07-22 LAB — URINALYSIS, COMPLETE (UACMP) WITH MICROSCOPIC
Glucose, UA: NEGATIVE mg/dL
Hgb urine dipstick: NEGATIVE
Ketones, ur: 20 mg/dL — AB
Leukocytes,Ua: NEGATIVE
Nitrite: NEGATIVE
Protein, ur: 100 mg/dL — AB
Specific Gravity, Urine: 1.028 (ref 1.005–1.030)
pH: 5 (ref 5.0–8.0)

## 2020-07-22 LAB — HEPATITIS PANEL, ACUTE
HCV Ab: NONREACTIVE
Hep A IgM: NONREACTIVE
Hep B C IgM: NONREACTIVE
Hepatitis B Surface Ag: NONREACTIVE

## 2020-07-22 LAB — CBC
HCT: 52.6 % — ABNORMAL HIGH (ref 36.0–46.0)
Hemoglobin: 17.5 g/dL — ABNORMAL HIGH (ref 12.0–15.0)
MCH: 31.4 pg (ref 26.0–34.0)
MCHC: 33.3 g/dL (ref 30.0–36.0)
MCV: 94.3 fL (ref 80.0–100.0)
Platelets: 374 10*3/uL (ref 150–400)
RBC: 5.58 MIL/uL — ABNORMAL HIGH (ref 3.87–5.11)
RDW: 12.7 % (ref 11.5–15.5)
WBC: 17.6 10*3/uL — ABNORMAL HIGH (ref 4.0–10.5)
nRBC: 0 % (ref 0.0–0.2)

## 2020-07-22 LAB — PROCALCITONIN: Procalcitonin: 2.56 ng/mL

## 2020-07-22 LAB — HIV ANTIBODY (ROUTINE TESTING W REFLEX): HIV Screen 4th Generation wRfx: NONREACTIVE

## 2020-07-22 LAB — LACTIC ACID, PLASMA
Lactic Acid, Venous: 1.5 mmol/L (ref 0.5–1.9)
Lactic Acid, Venous: 1.3 mmol/L (ref 0.5–1.9)

## 2020-07-22 LAB — GLUCOSE, CAPILLARY: Glucose-Capillary: 128 mg/dL — ABNORMAL HIGH (ref 70–99)

## 2020-07-22 LAB — MAGNESIUM: Magnesium: 2.4 mg/dL (ref 1.7–2.4)

## 2020-07-22 LAB — PROTIME-INR
INR: 1.2 (ref 0.8–1.2)
Prothrombin Time: 15.2 seconds (ref 11.4–15.2)

## 2020-07-22 LAB — APTT: aPTT: 34 seconds (ref 24–36)

## 2020-07-22 MED ORDER — VANCOMYCIN HCL 1750 MG/350ML IV SOLN
1750.0000 mg | Freq: Once | INTRAVENOUS | Status: AC
  Administered 2020-07-22: 1750 mg via INTRAVENOUS
  Filled 2020-07-22: qty 350

## 2020-07-22 MED ORDER — SODIUM CHLORIDE 0.9 % IV SOLN
500.0000 mL | Freq: Once | INTRAVENOUS | Status: AC
  Administered 2020-07-22: 500 mL via INTRAVENOUS

## 2020-07-22 MED ORDER — KETOROLAC TROMETHAMINE 30 MG/ML IJ SOLN
INTRAMUSCULAR | Status: AC
  Filled 2020-07-22: qty 1

## 2020-07-22 MED ORDER — OLANZAPINE 7.5 MG PO TABS
15.0000 mg | ORAL_TABLET | Freq: Every day | ORAL | Status: DC
  Filled 2020-07-22: qty 2

## 2020-07-22 MED ORDER — GLYCOPYRROLATE 0.2 MG/ML IJ SOLN
0.1000 mg | Freq: Once | INTRAMUSCULAR | Status: AC
  Administered 2020-07-22: 0.1 mg via INTRAVENOUS

## 2020-07-22 MED ORDER — GLYCOPYRROLATE 0.2 MG/ML IJ SOLN
INTRAMUSCULAR | Status: AC
  Filled 2020-07-22: qty 1

## 2020-07-22 MED ORDER — ACETAMINOPHEN 325 MG PO TABS: 650.0000 mg | ORAL_TABLET | Freq: Three times a day (TID) | ORAL | Status: DC | PRN

## 2020-07-22 MED ORDER — LORAZEPAM 1 MG PO TABS
1.0000 mg | ORAL_TABLET | Freq: Three times a day (TID) | ORAL | Status: DC
  Filled 2020-07-22: qty 1

## 2020-07-22 MED ORDER — ACETAMINOPHEN 325 MG PO TABS: 650.0000 mg | ORAL_TABLET | Freq: Four times a day (QID) | ORAL | Status: DC | PRN

## 2020-07-22 MED ORDER — ACETAMINOPHEN 650 MG RE SUPP
650.0000 mg | Freq: Four times a day (QID) | RECTAL | Status: DC | PRN
  Administered 2020-07-23 – 2020-07-25 (×2): 650 mg via RECTAL
  Filled 2020-07-22 (×2): qty 1

## 2020-07-22 MED ORDER — SUCCINYLCHOLINE CHLORIDE 20 MG/ML IJ SOLN
INTRAMUSCULAR | Status: DC | PRN
  Administered 2020-07-22: 100 mg via INTRAVENOUS

## 2020-07-22 MED ORDER — METOPROLOL TARTRATE 5 MG/5ML IV SOLN: 2.5000 mg | Freq: Once | INTRAVENOUS | Status: AC

## 2020-07-22 MED ORDER — METRONIDAZOLE IN NACL 5-0.79 MG/ML-% IV SOLN
500.0000 mg | Freq: Three times a day (TID) | INTRAVENOUS | Status: DC
  Administered 2020-07-22 – 2020-07-24 (×6): 500 mg via INTRAVENOUS
  Filled 2020-07-22 (×7): qty 100

## 2020-07-22 MED ORDER — METOPROLOL TARTRATE 5 MG/5ML IV SOLN
INTRAVENOUS | Status: AC
  Administered 2020-07-22: 2.5 mg via INTRAVENOUS
  Filled 2020-07-22: qty 5

## 2020-07-22 MED ORDER — SODIUM CHLORIDE 0.9 % IV BOLUS
2000.0000 mL | Freq: Once | INTRAVENOUS | Status: AC
  Administered 2020-07-22: 2000 mL via INTRAVENOUS

## 2020-07-22 MED ORDER — SODIUM CHLORIDE 0.9 % IV SOLN
2.0000 g | Freq: Two times a day (BID) | INTRAVENOUS | Status: DC
  Administered 2020-07-22: 2 g via INTRAVENOUS
  Filled 2020-07-22 (×3): qty 2

## 2020-07-22 MED ORDER — SODIUM CHLORIDE 0.9 % IV SOLN: INTRAVENOUS | Status: DC | PRN

## 2020-07-22 MED ORDER — ONDANSETRON HCL 4 MG/2ML IJ SOLN: 4.0000 mg | Freq: Three times a day (TID) | INTRAMUSCULAR | Status: DC | PRN

## 2020-07-22 MED ORDER — KETOROLAC TROMETHAMINE 30 MG/ML IJ SOLN
30.0000 mg | Freq: Once | INTRAMUSCULAR | Status: AC
  Administered 2020-07-22: 30 mg via INTRAVENOUS

## 2020-07-22 MED ORDER — SODIUM CHLORIDE 0.9 % IV SOLN: INTRAVENOUS | Status: DC

## 2020-07-22 MED ORDER — METRONIDAZOLE IN NACL 5-0.79 MG/ML-% IV SOLN: 500.0000 mg | Freq: Three times a day (TID) | INTRAVENOUS | Status: DC

## 2020-07-22 MED ORDER — ACETAMINOPHEN 10 MG/ML IV SOLN: 1000.0000 mg | Freq: Once | INTRAVENOUS | Status: AC

## 2020-07-22 MED ORDER — LORAZEPAM 2 MG/ML IJ SOLN: 1.0000 mg | Freq: Three times a day (TID) | INTRAMUSCULAR | Status: DC

## 2020-07-22 MED ORDER — HYDRALAZINE HCL 20 MG/ML IJ SOLN: 5.0000 mg | INTRAMUSCULAR | Status: DC | PRN

## 2020-07-22 MED ORDER — ACETAMINOPHEN 10 MG/ML IV SOLN
INTRAVENOUS | Status: AC
  Administered 2020-07-22: 1000 mg via INTRAVENOUS
  Filled 2020-07-22: qty 100

## 2020-07-22 MED ORDER — FLUOXETINE HCL 20 MG PO CAPS
20.0000 mg | ORAL_CAPSULE | Freq: Every day | ORAL | Status: DC
  Filled 2020-07-22: qty 1

## 2020-07-22 MED ORDER — NICOTINE 21 MG/24HR TD PT24
21.0000 mg | MEDICATED_PATCH | Freq: Every day | TRANSDERMAL | Status: DC
  Administered 2020-07-22 – 2020-08-05 (×14): 21 mg via TRANSDERMAL
  Filled 2020-07-22 (×14): qty 1

## 2020-07-22 MED ORDER — TRAZODONE HCL 100 MG PO TABS: 100.0000 mg | ORAL_TABLET | Freq: Every evening | ORAL | Status: DC | PRN

## 2020-07-22 MED ORDER — METHOHEXITAL SODIUM 100 MG/10ML IV SOSY
PREFILLED_SYRINGE | INTRAVENOUS | Status: DC | PRN
  Administered 2020-07-22: 80 mg via INTRAVENOUS
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
## 2020-07-22 NOTE — Anesthesia Preprocedure Evaluation (Signed)
Anesthesia Evaluation  Patient identified by MRN, date of birth, ID band Patient awake  General Assessment Comment:Hx reviewed with pt's mom, pt currently nonverbal  Reviewed: Allergy & Precautions, NPO status , Patient's Chart, lab work & pertinent test results, Unable to perform ROS - Chart review only  History of Anesthesia Complications Negative for: history of anesthetic complications  Airway       Comment: Unable to assess, pt not cooperative with exam Dental   Unable to assess, pt not cooperative with exam:   Pulmonary neg sleep apnea, neg COPD, Current Smoker and Patient abstained from smoking.,    breath sounds clear to auscultation- rhonchi (-) wheezing      Cardiovascular hypertension, Pt. on medications (-) CAD, (-) Past MI, (-) Cardiac Stents and (-) CABG  Rhythm:Regular Rate:Normal - Systolic murmurs and - Diastolic murmurs    Neuro/Psych  Headaches, neg Seizures PSYCHIATRIC DISORDERS Anxiety Depression Bipolar Disorder Schizophrenia    GI/Hepatic negative GI ROS, Neg liver ROS,   Endo/Other  negative endocrine ROSneg diabetes  Renal/GU negative Renal ROS     Musculoskeletal negative musculoskeletal ROS (+)   Abdominal (+) - obese,   Peds  Hematology negative hematology ROS (+)   Anesthesia Other Findings Past Medical History: No date: Bipolar disorder ([place]) No date: Hypertension 07/13/2016: Migraine No date: Schizophrenia ([place])   Reproductive/Obstetrics                             Anesthesia Physical Anesthesia Plan  ASA: II  Anesthesia Plan: General   Post-op Pain Management:    Induction: Intravenous  PONV Risk Score and Plan: 1 and Ondansetron  Airway Management Planned: Mask  Additional Equipment:   Intra-op Plan:   Post-operative Plan:   Informed Consent: I have reviewed the patients History and Physical, chart, labs and discussed the procedure  including the risks, benefits and alternatives for the proposed anesthesia with the patient or authorized representative who has indicated his/her understanding and acceptance.     Dental advisory given  Plan Discussed with: CRNA and Anesthesiologist  Anesthesia Plan Comments:         Anesthesia Quick Evaluation

## 2020-07-22 NOTE — Progress Notes (Signed)
Recreation Therapy Notes  Date: 07/22/2020  Time: 9:30 am   Location: Craft room    Behavioral response: N/A   Intervention Topic: Happiness    Discussion/Intervention: Patient did not attend group.   Clinical Observations/Feedback:  Patient did not attend group.   Shaverence Outlaw LRT/CTRS        Shaverence  Outlaw 07/22/2020 4:17 PM

## 2020-07-22 NOTE — Progress Notes (Signed)
[facility] MD Progress Note  07/22/2020 12:20 PM Lotoya Casella  MRN:  [id number]  CC: Follow-up for catatonic shizophrenia   Subjective:  45 year old female presenting with catatonic schizophrenia with immobility, mutism, staring , posturing, withdrawal, and tachycardia. Patient has started ECT this morning for treatment of catatonia. This morning, patient was noted to get up and walk to treatment team and back to her room. However, she has been observed motorically stuck in her room standing with a tooth brush in her hand for several minutes. She continues to only nod her head occasionally, and whisper in voice to quiet to hear. Explained diagnosis of catatonia and plan for ECT with patient via [place] interpreter once more. Will also start Ativan 1 mg TID PO or IM for further assistance with catatonia. Hepatitis panel negative. Will repeat CMP to monitor liver function and electrolytes given poor oral intake.   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 Bipolar disorder ([place])   . Hypertension   . Migraine 07/13/2016  . Schizophrenia ([place])    History reviewed. No pertinent surgical history. Family History:  Family History  Problem Relation Age of Onset  . Mental illness Neg Hx    Family Psychiatric  History: See H&P Social History:  Social History   Substance and Sexual Activity  Alcohol Use Yes     Social History   Substance and Sexual Activity  Drug Use No    Social History   Socioeconomic History  . Marital status: Single    Spouse name: Not on file  . Number of children: Not on file  . Years of education: Not on file  . Highest education level: Not on file  Occupational History  . Not on file   Tobacco Use  . Smoking status: Current Some Day Smoker    Packs/day: 1.00    Types: Cigarettes  . Smokeless tobacco: Never Used  Substance and Sexual Activity  . Alcohol use: Yes  . Drug use: No  . Sexual activity: Never  Other Topics Concern  . Not on file  Social History Narrative  . Not on file   Social Determinants of Health   Financial Resource Strain: Not on file  Food Insecurity: Not on file  Transportation Needs: Not on file  Physical Activity: Not on file  Stress: Not on file  Social Connections: Not on file   Additional Social History:                         Sleep: Fair  Appetite:  Poor  Current Medications: Current Facility-Administered Medications  Medication Dose Route Frequency Provider Last Rate Last Admin  . acetaminophen (TYLENOL) tablet 650 mg  650 mg Oral Q6H PRN Clapacs, John T, MD      . alum & mag hydroxide-simeth (MAALOX/MYLANTA) 200-200-20 MG/5ML suspension 30 mL  30 mL Oral Q4H PRN Clapacs, John T, MD      . FLUoxetine (PROZAC) capsule 20 mg  20 mg Oral Daily Clapacs, John T, MD      . glycopyrrolate (ROBINUL) 0.2 MG/ML injection           . ketorolac (TORADOL) 30 MG/ML injection           .  LORazepam (ATIVAN) tablet 1 mg  1 mg Oral TID Salley Scarlet, MD       Or  . LORazepam (ATIVAN) injection 1 mg  1 mg Intramuscular TID Salley Scarlet, MD      . magnesium hydroxide (MILK OF MAGNESIA) suspension 30 mL  30 mL Oral Daily PRN Clapacs, Madie Reno, MD      . OLANZapine (ZYPREXA) tablet 15 mg  15 mg Oral Daily Clapacs, John T, MD      . traZODone (DESYREL) tablet 100 mg  100 mg Oral QHS PRN Clapacs, Madie Reno, MD        Lab Results:  Results for orders placed or performed during the hospital encounter of 07/20/20 (from the past 48 hour(s))  Hepatitis panel, acute     Status: None   Collection Time: 07/21/20  2:08 PM  Result Value Ref Range   Hepatitis B Surface Ag NON REACTIVE NON REACTIVE   HCV Ab NON REACTIVE NON REACTIVE     Comment: (NOTE) Nonreactive HCV antibody screen is consistent with no HCV infections,  unless recent infection is suspected or other evidence exists to indicate HCV infection.     Hep A IgM NON REACTIVE NON REACTIVE   Hep B C IgM NON REACTIVE NON REACTIVE    Comment: Performed at [facility], [place] [address], [place], [place] [address]  Glucose, capillary     Status: Abnormal   Collection Time: 07/22/20  6:54 AM  Result Value Ref Range   Glucose-Capillary 128 (H) 70 - 99 mg/dL    Comment: Glucose reference range applies only to samples taken after fasting for at least 8 hours.    Blood Alcohol level:  Lab Results  Component Value Date   ETH <10 07/19/2020   ETH <10 28/31/5176    Metabolic Disorder Labs: Lab Results  Component Value Date   HGBA1C 5.3 07/20/2020   MPG 105.41 07/20/2020   MPG 108 01/31/2016   Lab Results  Component Value Date   PROLACTIN 124.2 (H) 01/31/2016   Lab Results  Component Value Date   CHOL 191 07/20/2020   TRIG 141 07/20/2020   HDL 39 (L) 07/20/2020   CHOLHDL 4.9 07/20/2020   VLDL 28 07/20/2020   LDLCALC 124 (H) 07/20/2020   LDLCALC 84 01/31/2016    Physical Findings: AIMS:  , ,  ,  ,    CIWA:    COWS:     Musculoskeletal: Strength & Muscle Tone: within normal limits Gait & Station: normal Patient leans: N/A  Psychiatric Specialty Exam:  Presentation  General Appearance: Casual Eye Contact: Fair Speech:Nonverbal aside from occasional whisper Speech Volume:Barely audible  Handedness:right  Mood and Affect  Mood: Appears anxious and depressed Affect:Congruent, tearful  Thought Process  Thought Processes:Negative Descriptions of Associations:Negative Orientation:Negative Thought Content:Negative History of Schizophrenia/Schizoaffective disorder:Yes  Duration of Psychotic Symptoms:2-3 weeks Hallucinations:Unknown Ideas of Reference:Unknown Suicidal Thoughts:Unknown Homicidal Thoughts:Unknown  Sensorium   Memory:Negative Judgment:Negative Insight:Negative  Executive Functions  Concentration:Negative Attention Span:Negative Recall:Negative Fund of Knowledge:Negative Language:Negative  Psychomotor Activity  Psychomotor Activity:Decreased  Assets  Assets:Family support, resilience   Sleep  Sleep:Adequate   Physical Exam: Physical Exam Vitals and nursing note reviewed.  Constitutional:      Appearance: Normal appearance.  HENT:     Head: Normocephalic and atraumatic.     Right Ear: External ear normal.     Left Ear: External ear normal.     Nose: Nose normal.     Mouth/Throat:  Mouth: Mucous membranes are moist.     Pharynx: Oropharynx is clear.  Eyes:     Extraocular Movements: Extraocular movements intact.     Conjunctiva/sclera: Conjunctivae normal.     Pupils: Pupils are equal, round, and reactive to light.  Cardiovascular:     Rate and Rhythm: Normal rate.     Pulses: Normal pulses.  Pulmonary:     Effort: Pulmonary effort is normal.     Breath sounds: Normal breath sounds.  Abdominal:     General: Abdomen is flat.     Palpations: Abdomen is soft.  Musculoskeletal:        General: No swelling. Normal range of motion.     Cervical back: Normal range of motion and neck supple.  Skin:    General: Skin is warm and dry.  Neurological:     General: No focal deficit present.     Mental Status: She is alert.     Cranial Nerves: No cranial nerve deficit.  Psychiatric:        Attention and Perception: She is inattentive.        Mood and Affect: Mood is depressed. Affect is flat.        Speech: She is noncommunicative.        Behavior: Behavior is withdrawn.        Cognition and Memory: Cognition is impaired. Memory is impaired.        Judgment: Judgment is inappropriate.    Review of Systems  Unable to perform ROS: Acuity of condition   Blood pressure (!) 142/97, pulse 97, temperature 98.1 F (36.7 C), temperature source Oral, resp. rate 18, height 5'  4" (1.626 m), weight 74.8 kg, SpO2 98 %. Body mass index is 28.31 kg/m.   Treatment Plan Summary: Daily contact with patient to assess and evaluate symptoms and progress in treatment and Medication management. 45 year old female presenting with catatonia- immobility, mutism, staring , posturing, withdrawal, and tachycardia. Lack of oral intake has resulted in some liver dysfunction. Continue Prozac 20 mg daily, olanzapine 15 mg daily. Acute hepatitis panel negative. Ativan 1 mg TID PO or IM for further assistance with catatonia. Hepatitis panel negative. Will repeat CMP to monitor liver function and electrolytes given poor oral intake.   Salley Scarlet, MD 07/22/2020, 12:20 PM

## 2020-07-22 NOTE — BHH Suicide Risk Assessment (Signed)
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  HENT: Negative for rhinorrhea and sore throat.   Eyes: Negative for photophobia and visual disturbance.  Respiratory: Negative for cough and shortness of breath.   Cardiovascular: Negative for chest pain and palpitations.  Gastrointestinal: Negative for abdominal distention, abdominal pain, constipation, diarrhea, nausea and vomiting.  Endocrine: Negative for cold intolerance and heat intolerance.  Genitourinary: Negative for difficulty urinating and dysuria.  Musculoskeletal: Negative for arthralgias and myalgias.  Skin: Negative for rash and wound.  Allergic/Immunologic: Negative for environmental allergies and food allergies.  Neurological: Negative for dizziness and headaches.  Hematological: Negative for adenopathy. Does not bruise/bleed easily.  Psychiatric/Behavioral: Positive for confusion, decreased concentration and dysphoric mood.    Blood pressure 115/78, pulse (!) 115, temperature (!) 101.4 F (38.6 C), resp. rate (!) 26, height 5\' 4"  (1.626 m), weight 74.8 kg, SpO2 95 %.Body mass index is 28.31 kg/m.  General Appearance: Disheveled  Eye Sport and exercise psychologist::  Fair  Speech:  Blocked  Volume:  Decreased  Mood:  Depressed  Affect:  Flat  Thought Process:   Disorganized  Orientation:  Negative  Thought Content:  Negative  Suicidal Thoughts:  No  Homicidal Thoughts:  No  Memory:  Negative  Judgement:  Impaired  Insight:  Present  Psychomotor Activity:  Decreased  Concentration:  Negative  Recall:  Negative  Fund of Knowledge:Negative  Language: Negative  Akathisia:  Negative  Handed:  Right  AIMS (if indicated):     Assets:  Desire for Improvement Financial Resources/Insurance Housing Social Support  Sleep:  Number of Hours: 8.25  Cognition: Impaired,  Severe  ADL's:  Impaired   Mental Status Per Nursing Assessment::   On Admission:  NA  Demographic Factors:  NA  Loss Factors: NA  Historical Factors: NA  Risk Reduction Factors:   Sense of responsibility to family and Religious beliefs about death  Continued Clinical Symptoms:  Depression:   Severe Currently Psychotic Unstable or Poor Therapeutic Relationship Previous Psychiatric Diagnoses and Treatments  Cognitive Features That Contribute To Risk:  None    Suicide Risk:  Minimal: No identifiable suicidal ideation.  Patients presenting with no risk factors but with morbid ruminations; may be classified as minimal risk based on the severity of the depressive symptoms    Plan Of Care/Follow-up recommendations:  Activity:  as tolerated Diet:  regular diet  Salley Scarlet, MD 07/22/2020, 4:17 PM

## 2020-07-22 NOTE — Anesthesia Postprocedure Evaluation (Signed)
Anesthesia Post Note  Patient: Arfa Lamarca  Procedure(s) Performed: ECT TX  Patient location during evaluation: PACU Anesthesia Type: General Level of consciousness: awake and alert and oriented Pain management: pain level controlled Vital Signs Assessment: post-procedure vital signs reviewed and stable Respiratory status: spontaneous breathing, nonlabored ventilation and respiratory function stable Cardiovascular status: blood pressure returned to baseline and stable Postop Assessment: no signs of nausea or vomiting Anesthetic complications: no   No complications documented.   Last Vitals:  Vitals:   07/22/20 1400 07/22/20 1414  BP: 126/90 115/78  Pulse: (!) 108 (!) 115  Resp: (!) 26 (!) 26  Temp: (!) 38.6 C   SpO2: 93% 95%    Last Pain:  Vitals:   07/22/20 1350  TempSrc:   PainSc: 0-No pain                 Bram Hottel

## 2020-07-22 NOTE — Discharge Summary (Signed)
Physician Discharge Summary Note  Patient:  Isabella [name] is an 45 y.o., female MRN:  [id number] DOB:  1976-01-21 Patient phone:  [phone] (home)  Patient address:   [address] [place] [address],  Total Time spent with patient: 35 minutes- 25 minutes face-to-face contact with patient, 10 minutes documentation, coordination of care, scripts   Date of Admission:  07/20/2020 Date of Discharge: 07/22/2020  Reason for Admission:  45 year old female with history fo schizophrenia, catatonic type presenting for 2-3 week period of catatonia with not speaking, not moving, and without adequate intake of food or water.  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 Looking back in the old chart and her current presentation is still unclear and differential includes schizophrenia with catatonic presentation or recurrent psychotic depression. Patient did respond well and tolerated ECT treatment in the past. Unknown if there have been any past suicide attempts but there is no evidence that she has tried to hurt her self today.  Past Medical History:  Past Medical History:  Diagnosis Date  . Bipolar disorder ([place])   . Hypertension   . Migraine 07/13/2016  . Schizophrenia ([place])    History reviewed. No pertinent surgical history. Family History:  Family History  Problem Relation Age of Onset  . Mental illness Neg Hx    Family Psychiatric  History: Unknown Social History:  Social History   Substance and Sexual Activity  Alcohol Use Yes     Social History   Substance and Sexual Activity  Drug Use No    Social History    Socioeconomic History  . Marital status: Single    Spouse name: Not on file  . Number of children: Not on file  . Years of education: Not on file  . Highest education level: Not on file  Occupational History  . Not on file  Tobacco Use  . Smoking status: Current Some Day Smoker    Packs/day: 1.00    Types: Cigarettes  . Smokeless tobacco: Never Used  Substance and Sexual Activity  . Alcohol use: Yes  . Drug use: No  . Sexual activity: Never  Other Topics Concern  . Not on file  Social History Narrative  . Not on file   Social Determinants of Health   Financial Resource Strain: Not on file  Food Insecurity: Not on file  Transportation Needs: Not on file  Physical Activity: Not on file  Stress: Not on file  Social Connections: Not on file    Hospital Course:  45 year old female with history fo schizophrenia, catatonic type presenting for 2-3 week period of catatonia with not speaking, not moving, and without adequate intake of food or water. She was recently started on ECT today for catatonia. After treatment she was noted to have a fever of 102.32F. Repeat CMP and CBC performed which revealed  increasing WBC of 17.6, LEFTs also continuing to uptrend at AST 78, ALT 74. She was also noted to have AKI with creatinine 1.75, BUN 58 likely secondary to poor oral intake. Acute hepatitis panel negative. Blood cultures, urinalysis, CXR, and RUQ ultrasound pending. Hospitalist team contacted who recommend transfer to their service for IV hydration and SIRS workup.  Physical Findings: AIMS:  , ,  ,  ,    CIWA:    COWS:     Musculoskeletal: Strength & Muscle Tone: within normal limits Gait & Station: normal Patient leans: N/A                Psychiatric Specialty Exam:  Presentation  General Appearance: No data recorded Eye Contact:No data recorded Speech:No data recorded Speech Volume:No data recorded Handedness:No data recorded  Mood and Affect  Mood:No  data recorded Affect:No data recorded  Thought Process  Thought Processes:No data recorded Descriptions of Associations:No data recorded Orientation:No data recorded Thought Content:No data recorded History of Schizophrenia/Schizoaffective disorder:Yes  Duration of Psychotic Symptoms:No data recorded Hallucinations:No data recorded Ideas of Reference:No data recorded Suicidal Thoughts:No data recorded Homicidal Thoughts:No data recorded  Sensorium  Memory:No data recorded Judgment:No data recorded Insight:No data recorded  Executive Functions  Concentration:No data recorded Attention Span:No data recorded Recall:No data recorded Fund of Knowledge:No data recorded Language:No data recorded  Psychomotor Activity  Psychomotor Activity:No data recorded  Assets  Assets:No data recorded  Sleep  Sleep:No data recorded   Physical Exam: Physical Exam ROS Blood pressure 115/78, pulse (!) 115, temperature (!) 101.4 F (38.6 C), resp. rate (!) 26, height 5\' 4"  (1.626 m), weight 74.8 kg, SpO2 95 %. Body mass index is 28.31 kg/m.   Have you used any form of tobacco in the last 30 days? (Cigarettes, Smokeless Tobacco, Cigars, and/or Pipes): Yes  Has this patient used any form of tobacco in the last 30 days? (Cigarettes, Smokeless Tobacco, Cigars, and/or Pipes) Yes, No  Blood Alcohol level:  Lab Results  Component Value Date   ETH <10 07/19/2020   ETH <10 99/24/2683    Metabolic Disorder Labs:  Lab Results  Component Value Date   HGBA1C 5.3 07/20/2020   MPG 105.41 07/20/2020   MPG 108 01/31/2016   Lab Results  Component Value Date   PROLACTIN 124.2 (H) 01/31/2016   Lab Results  Component Value Date   CHOL 191 07/20/2020   TRIG 141 07/20/2020   HDL 39 (L) 07/20/2020   CHOLHDL 4.9 07/20/2020   VLDL 28 07/20/2020   LDLCALC 124 (H) 07/20/2020   LDLCALC 84 01/31/2016    See Psychiatric Specialty Exam and Suicide Risk Assessment completed by Attending Physician  prior to discharge.  Discharge destination:  Other:  Transfer to medical floor.   Is patient on multiple antipsychotic therapies at discharge:  No   Has Patient had three or more failed trials of antipsychotic monotherapy by history:  No  Recommended Plan for Multiple Antipsychotic Therapies: NA  Discharge Instructions    Diet general   Complete by: As directed    Increase activity slowly   Complete by: As directed      Allergies as of 07/22/2020   No Known Allergies     Medication List    TAKE these medications     Indication  FLUoxetine 20 MG capsule Commonly known as: PROZAC Take 1 capsule (20 mg total) by mouth daily.  Indication: Depression   OLANZapine 15 MG tablet Commonly known as: ZYPREXA Take 15 mg by mouth daily.  Indication: Schizophrenia   traZODone 100 MG tablet Commonly known as: DESYREL Take 1 tablet (100 mg total) by mouth at bedtime as needed for sleep.  Indication: Anxiety Disorder, Trouble Sleeping        Follow-up recommendations:  Activity:  as tolerated Diet:  regular diet  Comments:  Recommend continuing Prozac 20 mg  daily and Zyprexa 15 mg daily for mood and psychosis. Psychiatry will continue to follow while on the medical floor.   Signed: Salley Scarlet, MD 07/22/2020, 4:21 PM

## 2020-07-22 NOTE — Progress Notes (Addendum)
Pt returned from ECT, it was reported to this writer from ECT nurse that pt had a fever and redness and drainage in her eyes. This was in turn reported to MD Domingo Cocking and she examined pt, with use of the [facility] approved interpreter device. This Probation officer was present. Pt at this time, while MD Reita Chard was present, refused her follow up vital signs post ECT, and medications, see MAR for details. Both this Probation officer and MD Reita Chard encouraged pt via interpreter to take medications and allow for vital signs but pt continued to refuse and was selectively mute also reported she was paranoid. Pt did not respond to encouragement and emotional support provided to obtain a response to assessment questions and cooperation with care and medication compliance.   Pt was also given a urine cup a sample was requested via interpreter, but thus far pt refuses to provide sample. Will continue to monitor pt and encourage compliance with medication and medical and psychiatric care. Charge RN and MD Domingo Cocking also made aware of pt's refusals. Plan also to report above information to oncoming shift. Will continue to monitor pt for safety and progress.   Soon after above refusals, Lab arrived and pt with use of interpreter refused lab draw. Dr. Domingo Cocking also notified.   This Probation officer was informed by Dr. Domingo Cocking pt would be transferring to medical.   Dr. Porfirio Mylar arrived and assessed pt and three members of the RRT team, including Dillian, RN, were present with Dr. Porfirio Mylar and the interpreter machine was used, pt refused to answer their questions except that she had some pain in her abdomen. The RRT team arrived due to positive mews score. Dr. Porfirio Mylar plans to move pt to medical floor 2A. Pt refuses fluids and food as well.   Attempted to call report at 1700, was asked to call back in 5 minutes due to nurse being with another pt. Called report to [place], RN 5 mins later at 17:05. Will transport pt to medical floor 2A room 256. Pt discharged at  17:25, left with pt's RN, this Probation officer, and MHT in wheelchair.

## 2020-07-22 NOTE — Anesthesia Procedure Notes (Signed)
Procedure Name: General with mask airway Date/Time: 07/22/2020 12:49 PM Performed by: Aline Brochure, CRNA Pre-anesthesia Checklist: Patient identified, Emergency Drugs available, Suction available and Patient being monitored Patient Re-evaluated:Patient Re-evaluated prior to induction Oxygen Delivery Method: Circle system utilized Preoxygenation: Pre-oxygenation with 100% oxygen Induction Type: IV induction Ventilation: Mask ventilation without difficulty and Mask ventilation throughout procedure Airway Equipment and Method: Bite block Placement Confirmation: positive ETCO2 Dental Injury: Teeth and Oropharynx as per pre-operative assessment

## 2020-07-22 NOTE — Consult Note (Signed)
Pharmacy Antibiotic Note  Isabella [name] is a 45 y.o. female admitted on 07/19/2020 with catatonic schizophrenia s/p ECT treatment x 1 07/22/20. Following return from ECT, patient with fever, redness, and drainage in her eyes. Per interpreter, patient also complaining of abdominal pain. Patient has been refusing lab draws. Patient transferred to medical floor. Pharmacy has been consulted for vancomycin and cefepime dosing for sepsis of known origin.    Plan:  Cefepime 2 gram Q12H based on current AKI  Vancomycin 1750 mg loading dose (25 mg/kg) x 1  will hold off on scheduled maintenance regimen at this time due to AKI  Scr 0.83 > 1.75   Anticipate Q36H regimen based on current renal function   follow up renal function tomorrow AM for dose adjustments  Follow up cultures      Temp (24hrs), Avg:100.8 F (38.2 C), Min:98.1 F (36.7 C), Max:102.1 F (38.9 C)  Recent Labs  Lab 07/19/20 1423 07/22/20 1434  WBC 14.5* 17.6*  CREATININE 0.83 1.75*    Estimated Creatinine Clearance: 39.8 mL/min (A) (by C-G formula based on SCr of 1.75 mg/dL (H)).    No Known Allergies  Antimicrobials this admission: 3/9 cefepime >>  3/9 vancomycin >>  3/9 Metronidazole >>  Dose adjustments this admission: n/a  Microbiology results: 3/9 BCx: sent 3/9 UCx: sent   Thank you for allowing pharmacy to be a part of this patients care.  Dorothe Pea, PharmD, BCPS Clinical Pharmacist  07/22/2020 6:06 PM

## 2020-07-22 NOTE — Progress Notes (Signed)
Pt was escorted to ECT with NT without issues. Pt requires verbal encouragement and some staff assist to get out of the chair and into the pre-op bed. Pt's ECT nurse assisted pt into bed. No distress noted, none reported.   Prior to going to ECT pt denies suicidal and homicidal ideation, denies anxiety, reports "Depression is a 10" when asked to rate it on a 0-10 scale 10 being worst. Pt affect was blunted, pt was slow to process and respond, thought blocking noted, makes poor eye contact. Pt was noted to be walking slowly around her room this morning. Pt oddly put her call light on in the bathroom twice but did not verbalize what she needed, was noted to be walking out of the bathroom after pushing it. It was reported by HS shift that pt was incontinent and they assisted her with a shower. Pt is able to follow basic instructions with encouragement. Pt was maintained as NPO prior to leaving for ECT.

## 2020-07-22 NOTE — Progress Notes (Signed)
   07/22/20 1754  Assess: MEWS Score  Temp 100 F (37.8 C)  BP (!) 149/111  Pulse Rate (!) 130  Resp 20  SpO2 96 %  O2 Device Room Air  Assess: MEWS Score  MEWS Temp 0  MEWS Systolic 0  MEWS Pulse 3  MEWS RR 0  MEWS LOC 0  MEWS Score 3  MEWS Score Color Yellow  Assess: if the MEWS score is Yellow or Red  Were vital signs taken at a resting state? Yes  Focused Assessment No change from prior assessment  Early Detection of Sepsis Score *See Row Information* High  MEWS guidelines implemented *See Row Information* Yes  Treat  MEWS Interventions Administered scheduled meds/treatments;Escalated (See documentation below)  Pain Scale 0-10  Pain Score 0  Take Vital Signs  Increase Vital Sign Frequency  Yellow: Q 2hr X 2 then Q 4hr X 2, if remains yellow, continue Q 4hrs  Escalate  MEWS: Escalate Yellow: discuss with charge nurse/RN and consider discussing with provider and RRT  Notify: Charge Nurse/RN  Name of Charge Nurse/RN Notified [place], RN  Date Charge Nurse/RN Notified 07/22/20  Time Charge Nurse/RN Notified 1800  Notify: Provider  Provider Name/Title Niu  Date Provider Notified 07/22/20  Time Provider Notified [phone]  Notification Type Page  Notification Reason Other (Comment)

## 2020-07-22 NOTE — BHH Group Notes (Signed)
LCSW Group Therapy Note  07/22/2020 2:11 PM  Type of Therapy/Topic:  Group Therapy:  Emotion Regulation  Participation Level:  Did Not Attend   Description of Group:   The purpose of this group is to assist patients in learning to regulate negative emotions and experience positive emotions. Patients will be guided to discuss ways in which they have been vulnerable to their negative emotions. These vulnerabilities will be juxtaposed with experiences of positive emotions or situations, and patients will be challenged to use positive emotions to combat negative ones. Special emphasis will be placed on coping with negative emotions in conflict situations, and patients will process healthy conflict resolution skills.  Therapeutic Goals: 1. Patient will identify two positive emotions or experiences to reflect on in order to balance out negative emotions 2. Patient will label two or more emotions that they find the most difficult to experience 3. Patient will demonstrate positive conflict resolution skills through discussion and/or role plays  Summary of Patient Progress: X  Therapeutic Modalities:   Cognitive Behavioral Therapy Feelings Identification [place], MSW, LCSW 07/22/2020 2:11 PM

## 2020-07-22 NOTE — Progress Notes (Signed)
Recreation Therapy Notes  INPATIENT RECREATION THERAPY ASSESSMENT  [name] MRN: [id number] DOB: 01/19/1976 Today's Date: 07/22/2020       Information Obtained From:  (Patient unable at this time)  Able to Participate in Assessment/Interview: No  Patient Presentation:    Reason for Admission (Per Patient):    Patient Stressors:    Coping Skills:      Leisure Interests (2+):     Frequency of Recreation/Participation:    Awareness of Community Resources:     [employer]:     Current Use:    If no, Barriers?:    Expressed Interest in [place] of Residence:     Patient Main Form of Transportation:    Patient Strengths:     Patient Identified Areas of Improvement:     Patient Goal for Hospitalization:     Current SI (including self-harm):     Current HI:     Current AVH:    Staff Intervention Plan:    Consent to Intern Participation:    [place]  Outlaw 07/22/2020, 4:08 PM

## 2020-07-22 NOTE — H&P (Signed)
Isabella [name] is an 45 y.o. female.   Chief Complaint: Patient is not voicing complaint as she is not verbal right now HPI: History of schizophrenia or schizoaffective disorder with previous episodes of catatonia now presenting once again with catatonia  Past Medical History:  Diagnosis Date  . Bipolar disorder ([place])   . Hypertension   . Migraine 07/13/2016  . Schizophrenia ([place])     History reviewed. No pertinent surgical history.  Family History  Problem Relation Age of Onset  . Mental illness Neg Hx    Social History:  reports that she has been smoking cigarettes. She has been smoking about 1.00 pack per day. She has never used smokeless tobacco. She reports current alcohol use. She reports that she does not use drugs.  Allergies: No Known Allergies  Medications Prior to Admission  Medication Sig Dispense Refill  . FLUoxetine (PROZAC) 20 MG capsule Take 1 capsule (20 mg total) by mouth daily. (Patient not taking: No sig reported) 30 capsule 1  . OLANZapine (ZYPREXA) 15 MG tablet Take 15 mg by mouth daily. (Patient not taking: No sig reported)    . traZODone (DESYREL) 100 MG tablet Take 1 tablet (100 mg total) by mouth at bedtime as needed for sleep. (Patient not taking: No sig reported) 14 tablet 0    Results for orders placed or performed during the hospital encounter of 07/20/20 (from the past 48 hour(s))  Hepatitis panel, acute     Status: None   Collection Time: 07/21/20  2:08 PM  Result Value Ref Range   Hepatitis B Surface Ag NON REACTIVE NON REACTIVE   HCV Ab NON REACTIVE NON REACTIVE    Comment: (NOTE) Nonreactive HCV antibody screen is consistent with no HCV infections,  unless recent infection is suspected or other evidence exists to indicate HCV infection.     Hep A IgM NON REACTIVE NON REACTIVE   Hep B C IgM NON REACTIVE NON REACTIVE    Comment: Performed at [facility], [facility] [address], [place], [place] [address]  Glucose, capillary      Status: Abnormal   Collection Time: 07/22/20  6:54 AM  Result Value Ref Range   Glucose-Capillary 128 (H) 70 - 99 mg/dL    Comment: Glucose reference range applies only to samples taken after fasting for at least 8 hours.   No results found.  Review of Systems  Unable to perform ROS: Psychiatric disorder  Psychiatric/Behavioral: Negative for confusion.    Blood pressure (!) 142/97, pulse 97, temperature 98.1 F (36.7 C), temperature source Oral, resp. rate 18, height 5\' 4"  (1.626 m), weight 74.8 kg, SpO2 98 %. Physical Exam Vitals and nursing note reviewed.  Constitutional:      Appearance: She is well-developed and well-nourished.  HENT:     Head: Normocephalic and atraumatic.  Eyes:     Conjunctiva/sclera: Conjunctivae normal.     Pupils: Pupils are equal, round, and reactive to light.  Cardiovascular:     Heart sounds: Normal heart sounds.  Pulmonary:     Effort: Pulmonary effort is normal.  Abdominal:     Palpations: Abdomen is soft.  Musculoskeletal:        General: Normal range of motion.     Cervical back: Normal range of motion.  Skin:    General: Skin is warm and dry.  Neurological:     General: No focal deficit present.     Mental Status: She is alert.  Psychiatric:  Attention and Perception: She is inattentive.        Mood and Affect: Affect is blunt.        Speech: She is noncommunicative.      Assessment/Plan Plan is to initiate ECT treatment today continue index course until improved back to baseline  [name], MD 07/22/2020, 11:22 AM

## 2020-07-22 NOTE — Procedures (Signed)
ECT SERVICES Physician's Interval Evaluation & Treatment Note  Patient Identification: Cyril Railey MRN:  [id number] Date of Evaluation:  07/22/2020 TX #: 1  MADRS:   MMSE:   P.E. Findings:  Not verbal  Psychiatric Interval Note:  catatonic  Subjective:  [name] seen for evaluation for Electroconvulsive Therapy. none  Treatment Summary:   []   Right Unilateral             []  Bilateral   % Energy : 1.78ms 50%    Impedance: 1620 ohms  Seizure Energy Index: 46498 m bv2  Postictal Suppression Index: 94%  Seizure Concordance Index: 97%  Medications  Pre Shock: rob 0.1 mg tor 30 mg brev 80mg  suc 100mg   Post Shock:    Seizure Duration: 36s emg 37 s eeg   Comments: Return fri   Lungs:  [x]   Clear to auscultation               []  Other:   Heart:    [x]   Regular rhythm             []  irregular rhythm    [x]   Previous H&P reviewed, patient examined and there are NO CHANGES                 [x]   Previous H&P reviewed, patient examined and there are changes noted.   [name], MD 3/9/202212:50 PM

## 2020-07-22 NOTE — Progress Notes (Signed)
Dr. Randa Lynn and Clapacs notified of heart rate in 120's and temperature 102.1.  Orders for tylenol and metoprolol received. Interpreter at bedside. Patient denies any discomfort.

## 2020-07-22 NOTE — BHH Counselor (Signed)
CSW attempts to complete PSA remain unsuccessful.    Assunta Curtis, MSW, LCSW 07/22/2020 4:12 PM

## 2020-07-22 NOTE — Consult Note (Signed)
CODE SEPSIS - PHARMACY COMMUNICATION  **Broad Spectrum Antibiotics should be administered within 1 hour of Sepsis diagnosis**  Time Code Sepsis Called/Page Received: 7322  Antibiotics Ordered: vancomycin, cefepime, metronidazole  Time of 1st antibiotic administration: 1851  Additional action taken by pharmacy: N/A  If necessary, Name of Provider/Nurse Contacted: N/A    Dorothe Pea ,PharmD, BCPS Clinical Pharmacist  07/22/2020  6:01 PM

## 2020-07-22 NOTE — Transfer of Care (Signed)
Immediate Anesthesia Transfer of Care Note  Patient: Isabella [name]  Procedure(s) Performed: ECT TX  Patient Location: PACU  Anesthesia Type:General  Level of Consciousness: sedated  Airway & Oxygen Therapy: Patient connected to face mask oxygen  Post-op Assessment: Post -op Vital signs reviewed and stable  Post vital signs: stable  Last Vitals:  Vitals Value Taken Time  BP 146/101 07/22/20 1256  Temp 38.2 C 07/22/20 1255  Pulse 138 07/22/20 1258  Resp 26 07/22/20 1258  SpO2 97 % 07/22/20 1258  Vitals shown include unvalidated device data.  Last Pain:  Vitals:   07/22/20 1059  TempSrc: Oral  PainSc: 0-No pain         Complications: No complications documented.

## 2020-07-22 NOTE — Tx Team (Addendum)
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 acetaminophen (TYLENOL) tablet 650 mg  650 mg Oral Q6H PRN Clapacs, John T, MD      . alum & mag hydroxide-simeth (MAALOX/MYLANTA) 200-200-20 MG/5ML suspension 30 mL  30 mL Oral Q4H PRN Clapacs, John T, MD      . FLUoxetine (PROZAC) capsule 20 mg  20 mg Oral Daily Clapacs, John T, MD      . LORazepam (ATIVAN) tablet 1 mg  1 mg Oral Once Salley Scarlet, MD      . magnesium hydroxide (MILK OF MAGNESIA) suspension 30 mL  30 mL Oral Daily PRN Clapacs, John T, MD      . OLANZapine (ZYPREXA) tablet 15 mg  15 mg Oral Daily Clapacs, John T, MD      . traZODone (DESYREL) tablet 100 mg  100 mg Oral QHS PRN Clapacs, Madie [place], MD       PTA Medications: Medications Prior to Admission  Medication Sig Dispense Refill Last Dose  . FLUoxetine (PROZAC) 20 MG capsule Take 1 capsule (20 mg total) by mouth daily. (Patient not taking: No sig reported) 30 capsule 1   . OLANZapine (ZYPREXA) 15 MG tablet Take 15 mg by mouth daily. (Patient not taking: No sig reported)     . traZODone (DESYREL) 100 MG tablet Take 1 tablet (100 mg total) by mouth at bedtime as needed for sleep. (Patient not taking: No sig reported) 14 tablet 0     Patient Stressors: Medication change or noncompliance Other: Patient is catatonic   Patient Strengths: Motivation for treatment/growth Supportive family/friends  Treatment Modalities: Medication Management, Group therapy, Case management,  1 to 1 session with clinician, Psychoeducation, Recreational therapy.   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[place]) Long Term Goal(s): Improvement in symptoms so as ready for discharge Improvement in symptoms so as ready for discharge   Short Term Goals: Ability to identify changes in lifestyle to reduce recurrence of condition will improve Ability to verbalize feelings will improve Ability to disclose and discuss suicidal ideas Ability to demonstrate self-control will improve Ability to identify and develop effective coping behaviors will improve Ability to maintain clinical measurements within normal limits will improve Compliance with prescribed medications will improve Ability to identify changes in lifestyle to reduce recurrence of condition will improve Ability to verbalize feelings will improve Ability to disclose and discuss suicidal ideas Ability to demonstrate self-control will improve Ability to identify and develop effective coping behaviors will improve Ability to maintain clinical measurements within normal limits will improve Compliance with prescribed medications will improve  Medication Management: Evaluate patient's response, side effects, and tolerance of medication regimen.  Therapeutic Interventions: 1 to 1 sessions, Unit Group sessions and Medication administration.  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[place]) Active Problems:   Social anxiety disorder   LFTs abnormal  Long Term Goal(s): Improvement in symptoms so as ready for discharge Improvement in symptoms so as ready for discharge   Short Term Goals: Ability to identify changes in lifestyle to reduce recurrence of condition will improve Ability to verbalize feelings  will improve Ability to disclose and discuss suicidal ideas Ability to demonstrate self-control will improve Ability to identify and develop effective coping behaviors will improve Ability to maintain clinical  measurements within normal limits will improve Compliance with prescribed medications will improve Ability to identify changes in lifestyle to reduce recurrence of condition will improve Ability to verbalize feelings will improve Ability to disclose and discuss suicidal ideas Ability to demonstrate self-control will improve Ability to identify and develop effective coping behaviors will improve Ability to maintain clinical measurements within normal limits will improve Compliance with prescribed medications will improve     Medication Management: Evaluate patient's response, side effects, and tolerance of medication regimen.  Therapeutic Interventions: 1 to 1 sessions, Unit Group sessions and Medication administration.  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 RN will report any adverse and/or side effects to prescribing provider.  Therapeutic Interventions: 1 on 1 counseling sessions, Psychoeducation, Medication administration, Evaluate responses to treatment, Monitor vital signs and CBGs as ordered, Perform/monitor CIWA, COWS, AIMS and Fall Risk screenings as ordered, Perform wound care treatments as ordered.  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  Short Term Goals: Engage patient in aftercare planning with referrals and resources, Increase social support, Increase ability to appropriately verbalize feelings, Increase emotional regulation, Facilitate acceptance of mental health diagnosis and concerns, Identify triggers associated with mental health/substance abuse issues and Increase skills for wellness and recovery  Therapeutic Interventions: Assess for all discharge needs, 1 to 1 time with Social worker, Explore available resources and support systems, Assess for adequacy in community support network, Educate family and significant other(s) on suicide prevention, Complete Psychosocial Assessment, Interpersonal group therapy.  Evaluation of Outcomes: Not Met   Progress in Treatment: Attending groups: No. Participating in groups: No. Taking medication as prescribed: Yes. Toleration medication: Yes. Family/Significant other contact made: No, will contact:  once permission is given Patient understands diagnosis: Yes. Discussing patient identified problems/goals with staff: Yes. Medical problems stabilized or resolved: No. Denies suicidal/homicidal ideation: Yes. Issues/concerns per patient self-inventory: No. Other: None  New problem(s) identified: No, Describe:  None  New Short Term/Long Term Goal(s):  Engage patient in aftercare planning with referrals and resources, Increase social support, Increase ability to appropriately verbalize feelings, Increase emotional regulation, Facilitate acceptance of mental health diagnosis and concerns, Identify triggers associated with mental health/substance abuse issues and Increase skills for wellness and recovery  Patient Goals:  Pt stated that her goals would be to feel better, eat and be less depressed.  Discharge Plan or Barriers:  CSW will assist pt in obtaining transportation and follow-up care.  Reason for Continuation of  Hospitalization: Medication stabilization Other; describe Catatonia  Estimated Length of Stay: 1-7 days  Recreational Therapy: Patient Stressors: N/A Patient Goal: Patient will engage in groups without prompting or encouragement from LRT x3 group sessions within 5  recreation therapy group sessions.   Attendees: Patient: Aniqa Hare  07/22/2020 9:53 AM  Physician: Salley Scarlet, MD 07/22/2020 9:53 AM  Nursing:  07/22/2020 9:53 AM  RN Care Manager: 07/22/2020 9:53 AM  Social Worker: Assunta Curtis, MSW, LCSW  07/22/2020 9:53 AM  Recreational Therapist: Roanna Epley, Reather Converse, LRT  07/22/2020 9:53 AM  Other: Kiva [place], MSW, LCSW-A 07/22/2020 9:53 AM  Other: Michell Heinrich, MSW, [place], [place] 07/22/2020 9:53 AM  Other: 07/22/2020 9:53 AM    Scribe for Treatment Team: Kiva A [place], [place] 07/22/2020 9:53 AM

## 2020-07-22 NOTE — Progress Notes (Signed)
Rapid Response Consult note:  Patient seen per request of [facility] for [place] MEWS. Patient is non-english-speaking, non-interactive. Per the bedside RN, patient is paranoid and intermittently catatonic. Per bedside RN, patient has been refusing meds, lab draws, and food/drink. Patient is S/P ECT today. Patient is in no acute distress. Manual pulse ~ 120. Skin warm to touch. Plan is for admission to PCU.

## 2020-07-23 ENCOUNTER — Inpatient Hospital Stay: Payer: Medicaid Other

## 2020-07-23 DIAGNOSIS — R6520 Severe sepsis without septic shock: Secondary | ICD-10-CM

## 2020-07-23 DIAGNOSIS — A419 Sepsis, unspecified organism: Principal | ICD-10-CM

## 2020-07-23 LAB — BASIC METABOLIC PANEL
Anion gap: 14 (ref 5–15)
BUN: 49 mg/dL — ABNORMAL HIGH (ref 6–20)
CO2: 15 mmol/L — ABNORMAL LOW (ref 22–32)
Calcium: 9.3 mg/dL (ref 8.9–10.3)
Chloride: 121 mmol/L — ABNORMAL HIGH (ref 98–111)
Creatinine, Ser: 1 mg/dL (ref 0.44–1.00)
GFR, Estimated: >60 mL/min (ref >60)
Glucose, Bld: 109 mg/dL — ABNORMAL HIGH (ref 70–99)
Potassium: 3.5 mmol/L (ref 3.5–5.1)
Sodium: 150 mmol/L — ABNORMAL HIGH (ref 135–145)

## 2020-07-23 LAB — CBC
HCT: 49.6 % — ABNORMAL HIGH (ref 36.0–46.0)
Hemoglobin: 16 g/dL — ABNORMAL HIGH (ref 12.0–15.0)
MCH: 30.7 pg (ref 26.0–34.0)
MCHC: 32.3 g/dL (ref 30.0–36.0)
MCV: 95 fL (ref 80.0–100.0)
Platelets: 322 10*3/uL (ref 150–400)
RBC: 5.22 MIL/uL — ABNORMAL HIGH (ref 3.87–5.11)
RDW: 12.8 % (ref 11.5–15.5)
WBC: 16 10*3/uL — ABNORMAL HIGH (ref 4.0–10.5)
nRBC: 0 % (ref 0.0–0.2)

## 2020-07-23 LAB — PHOSPHORUS: Phosphorus: 3.5 mg/dL (ref 2.5–4.6)

## 2020-07-23 LAB — MAGNESIUM: Magnesium: 2.2 mg/dL (ref 1.7–2.4)

## 2020-07-23 MED ORDER — SODIUM BICARBONATE 8.4 % IV SOLN
50.0000 meq | INTRAVENOUS | Status: AC
  Administered 2020-07-23 (×2): 50 meq via INTRAVENOUS
  Filled 2020-07-23: qty 100

## 2020-07-23 MED ORDER — SODIUM CHLORIDE 0.9 % IV SOLN
2.0000 g | Freq: Three times a day (TID) | INTRAVENOUS | Status: DC
  Administered 2020-07-23 – 2020-07-24 (×5): 2 g via INTRAVENOUS
  Filled 2020-07-23 (×6): qty 2

## 2020-07-23 MED ORDER — VANCOMYCIN VARIABLE DOSE PER UNSTABLE RENAL FUNCTION (PHARMACIST DOSING): Status: DC

## 2020-07-23 MED ORDER — SODIUM BICARBONATE 650 MG PO TABS
650.0000 mg | ORAL_TABLET | Freq: Three times a day (TID) | ORAL | Status: AC
  Administered 2020-07-23 – 2020-07-24 (×2): 650 mg via ORAL
  Filled 2020-07-23 (×4): qty 1

## 2020-07-23 MED ORDER — SODIUM CHLORIDE 0.45 % IV SOLN: INTRAVENOUS | Status: DC

## 2020-07-23 MED ORDER — VANCOMYCIN HCL 1500 MG/300ML IV SOLN
1500.0000 mg | INTRAVENOUS | Status: DC
  Administered 2020-07-23: 1500 mg via INTRAVENOUS
  Filled 2020-07-23 (×2): qty 300

## 2020-07-23 MED ORDER — ENOXAPARIN SODIUM 40 MG/0.4ML ~~LOC~~ SOLN
40.0000 mg | Freq: Every evening | SUBCUTANEOUS | Status: DC
  Administered 2020-07-23 – 2020-08-04 (×13): 40 mg via SUBCUTANEOUS
  Filled 2020-07-23 (×13): qty 0.4

## 2020-07-23 NOTE — Progress Notes (Signed)
[facility] Progress Note  Patient: Isabella [name]    ZLD:[id number]  DOA: 07/22/2020     Date of Service: the patient was seen and examined on 07/23/2020  No chief complaint on file.  Brief hospital course:  Isabella [name] is a 45 y.o. female with medical history significant of hypertension, schizophrenia, bipolar disorder, tobacco abuse, migraine headache, who presents with fever.  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 Per Dr. Domingo Cocking, pt has 2-3 week period of catatonia, not speaking, not moving, and without adequate intake of food or water. Pt is s/p of ECT treatment today. Pt was found to have fever or 101.4 today, with unclear source of infection.  I called her relatives by phone, and was told that patient complains of left abdominal/flank pain recently. When I saw patient in [facility] units, she is not not talking to the iPad translator and does not answer any questions clearly.  No active respiratory distress, nausea, vomiting, diarrhea noted.  Not sure if patient has any suicidal or homicidal ideations.  ED Course: pt was found to have WBC 17.6, on admission, patient had alcohol level less than 10, salicylate level less than 7, negative Covid PCR 07/19/2020, abnormal liver function with ALP 96, AST 78, ALT 74, total bilirubin 1.8, AKI with creatinine 1.75, BUN 58 (creatinine 0.83 on 07/19/2020), temperature 101.4, blood pressure 115/78, heart rate up to 140s -->120 -130s, RR 19, oxygen saturation 93-95% on room air.   Assessment and Plan:  # Sepsis secondary to UTI,  Sepsis criteria UTI, leukocytosis, fever, tachycardia, tachypnea Patient received 2 L NS bolus in the ED, acid 1.3 Leukocytosis WBC 16, continue to trend UA positive Continue vancomycin, cefepime and metronidazole IV antibiotics  Follow blood culture and urine culture   # Hypernatremia most likely due to dehydration and IV fluid given for  resuscitation Sodium 150 continue to trend Changed IV fluid from NS to 0.45 saline   # Metabolic acidosis, started oral bicarbonate NaBicarb 50 mEq IV x2 doses ordered  Check chemistry daily   # Catatonic schizophrenia, Social anxiety disorder, depression s/p of ECT. -continue Prozac, olanzapine and trazodone Follow psych for further recommendation Continue fall precaution and aspiration precaution One-to-one observation   # LFTs abnormal: Patient had a negative hepatitis panel -US-RUQ: Cholelithiasis and sludge in gallbladder. No appreciable gallbladder wall thickening or pericholecystic fluid. Hepatic steatosis. -Careful use Tylenol (Cannot use ibuprofen due to AKI)   # HTN (hypertension): Blood pressure 115/78 -IV hydralazine as needed  AKI (acute kidney injury) ([place]) -Follow-up urinalysis -CT-renal stone, no obstructive uropathy or stones, gallbladder sludge or stone, hepatic cirrhosis,  recommended liver sonogram.   Tobacco abuse -nicotine patch   # Fibroid uterus found on CT scan, recommend to follow with GYN as an outpatient CT: Enlarged uterus with multiple fibroids, including a dominant 8.7 cm fibroid about the left fundus. There is also a pedunculated fibroid extending into the right adnexa.   Body mass index is 29.06 kg/m.        Diet: regular DVT Prophylaxis: Subcutaneous Lovenox   Advance goals of care discussion: Full code  Family Communication: family was NOT present at bedside, at the time of interview.    Disposition:  Pt is from home, admitted with sepsis and UTI, catatonic schizophrenia, still has infection on IV antibiotics, cultures are pending, which precludes a safe discharge. Discharge to psych unit, when medically stable, may need 1 or 2 days more  stay.  Subjective: No overnight issues, patient was admitted yesterday due to sepsis possibly due to UTI.  Patient has catatonic schizophrenia, psych is following.  Patient was lying  comfortably, did not follow any commands and she is catatonic/mute, unable to follow any commands and did not offer any complaints.   Physical Exam: General:  Awake but catatonic  Appear in moderate distress Eyes: PERRLA ENT: Oral Mucosa Clear, moist  Neck: no JVD,  Cardiovascular: S1 and S2 Present, no Murmur,  Respiratory: good respiratory effort, Bilateral Air entry equal and Decreased, no Crackles, no wheezes Abdomen: Bowel Sound present, Soft and no tenderness,  Skin: no rashes Extremities: no Pedal edema, no calf tenderness Neurologic: without any new focal findings Gait not checked due to patient safety concerns  Vitals:   07/23/20 0447 07/23/20 0500 07/23/20 0739 07/23/20 1117  BP: (!) 151/92  (!) 152/99 (!) 157/100  Pulse: (!) 122  (!) 116 (!) 120  Resp: 19  15 16   Temp: 98.7 F (37.1 C)  99 F (37.2 C) 98.9 F (37.2 C)  TempSrc:    Axillary  SpO2: 97%  97% 96%  Weight:  76.8 kg      Intake/Output Summary (Last 24 hours) at 07/23/2020 1346 Last data filed at 07/23/2020 1230 Gross per 24 hour  Intake 1658.19 ml  Output 950 ml  Net 708.19 ml   Filed Weights   07/23/20 0500  Weight: 76.8 kg    Data Reviewed: I have personally reviewed and interpreted daily labs, tele strips, imagings as discussed above. I reviewed all nursing notes, pharmacy notes, vitals, pertinent old records I have discussed plan of care as described above with RN and patient/family.  CBC: Recent Labs  Lab 07/19/20 1423 07/22/20 1434 07/23/20 0625  WBC 14.5* 17.6* 16.0*  HGB 17.7* 17.5* 16.0*  HCT 53.0* 52.6* 49.6*  MCV 93.8 94.3 95.0  PLT 388 374 209   Basic Metabolic Panel: Recent Labs  Lab 07/19/20 1423 07/22/20 1434 07/23/20 0625  NA 139 147* 150*  K 4.1 4.1 3.5  CL 108 115* 121*  CO2 17* 17* 15*  GLUCOSE 127* 144* 109*  BUN 29* 58* 49*  CREATININE 0.83 1.75* 1.00  CALCIUM 9.8 9.7 9.3  MG  --  2.4 2.2  PHOS  --  3.6 3.5    Studies: DG Chest Port 1  View  Result Date: 07/22/2020 CLINICAL DATA:  Abnormal LFTs EXAM: PORTABLE CHEST 1 VIEW COMPARISON:  None. FINDINGS: Cardiac shadow is at the upper limits of normal in size. The lungs are well aerated without focal infiltrate or effusion. No acute bony abnormality is noted. Old healed left clavicular fracture is noted. IMPRESSION: No acute abnormality noted. Electronically Signed   By: Inez Catalina M.D.   On: 07/22/2020 19:19   CT RENAL STONE STUDY  Result Date: 07/22/2020 CLINICAL DATA:  Flank pain.  Kidney stone suspected. EXAM: CT ABDOMEN AND PELVIS WITHOUT CONTRAST TECHNIQUE: Multidetector CT imaging of the abdomen and pelvis was performed following the standard protocol without IV contrast. COMPARISON:  None. FINDINGS: Lower chest: Linear atelectasis within both lower lobes. No pleural fluid. Heart is normal in size. Hepatobiliary: Decreased hepatic density consistent with steatosis. There is no evidence of focal lesion on noncontrast exam. Layering density in the gallbladder suggesting sludge or small stones. Curvilinear calcification about the gallbladder fundus may be a large lamellated stone or gallbladder wall calcifications. No biliary dilatation. Focal low-density about the distal gallbladder fundus may represent a polyp but is  incompletely characterize, series 2, image 36. Limited assessment for pericholecystic fat stranding due to motion. Pancreas: No acute finding allowing for motion. No definite peripancreatic fat stranding. Spleen: Normal in size without focal abnormality. Adrenals/Urinary Tract: Normal adrenal glands. No hydronephrosis or renal calculi. No significant perinephric edema. Ureters are decompressed without stones along the course. Unremarkable urinary bladder. No bladder stone. Stomach/Bowel: Small hiatal hernia. No small bowel obstruction or obvious inflammation. Normal appendix. Majority of the colon is decompressed. No obvious colonic wall thickening or pericolonic inflammation.  Vascular/Lymphatic: Normal caliber abdominal aorta. Small retroperitoneal lymph nodes are not enlarged by size criteria. Reproductive: Enlarged uterus with multiple fibroids, including a dominant 8.7 x 8.5 cm about the left fundus. There is also a pedunculated fibroid extending into the right adnexa. The right ovary is seen separate from this pedunculated fibroid. Left ovary is tentatively visualized and normal. There is air in the vagina. Other: No free air or ascites.  No significant body wall hernia. Musculoskeletal: There are no acute or suspicious osseous abnormalities. Scattered bone islands in the pelvis. IMPRESSION: 1. No renal stones or obstructive uropathy. 2. Layering density in the gallbladder suggesting sludge or small stones. Curvilinear calcification about the gallbladder fundus may be a large lamellated stone or gallbladder wall calcifications. Focal low-density about the distal gallbladder fundus may represent a polyp but is incompletely characterized. Recommend further evaluation with right upper quadrant ultrasound. 3. Enlarged uterus with multiple fibroids, including a dominant 8.7 cm fibroid about the left fundus. There is also a pedunculated fibroid extending into the right adnexa. 4. Hepatic steatosis. 5. Small hiatal hernia. 6. Motion limited exam. Electronically Signed   By: Keith Rake M.D.   On: 07/22/2020 22:58   US Abdomen Limited RUQ (LIVER/GB)  Result Date: 07/23/2020 CLINICAL DATA:  Elevated liver enzymes EXAM: ULTRASOUND ABDOMEN LIMITED RIGHT UPPER QUADRANT COMPARISON:  None. FINDINGS: Gallbladder: There are multiple echogenic foci within the gallbladder which move and shadow consistent with cholelithiasis. Largest gallstone measures 2.3 cm in length. Sludge is also noted in the gallbladder. The gallbladder wall is not overtly thickened. There is no pericholecystic fluid. No sonographic Murphy sign noted by sonographer. Common bile duct: Diameter: 4 mm. No intrahepatic or  extrahepatic biliary duct dilatation. Liver: No focal lesion identified. Liver echogenicity is increased diffusely. Portal vein is patent on color Doppler imaging with normal direction of blood flow towards the liver. Other: None. IMPRESSION: 1. Cholelithiasis and sludge in gallbladder. No appreciable gallbladder wall thickening or pericholecystic fluid. 2. Increase in liver echogenicity is likely indicative of hepatic steatosis. No focal liver lesions evident. Note that the sensitivity of ultrasound for detection of focal liver lesions is diminished in this circumstance. Electronically Signed   By: Lowella Grip III M.D.   On: 07/23/2020 11:19    Scheduled Meds: . FLUoxetine  20 mg Oral Daily  . nicotine  21 mg Transdermal Daily  . OLANZapine  15 mg Oral Daily  . sodium bicarbonate  650 mg Oral TID  . vancomycin variable dose per unstable renal function (pharmacist dosing)   Does not apply See admin instructions   Continuous Infusions: . sodium chloride 75 mL/hr at 07/23/20 1050  . ceFEPime (MAXIPIME) IV 2 g (07/23/20 1051)  . metronidazole Stopped (07/23/20 0407)   PRN Meds: acetaminophen, acetaminophen, hydrALAZINE, ondansetron (ZOFRAN) IV, traZODone  Time spent: 35 minutes  Author: Val Riles. MD [facility] 07/23/2020 1:46 PM  To reach On-call, see care teams to locate the attending and reach out to  them via [email or web address]. If 7PM-7AM, please contact night-coverage If you still have difficulty reaching the attending provider, please page the [facility] (Director on Call) for [facility] on amion for assistance.
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
## 2020-07-23 NOTE — Consult Note (Addendum)
Pharmacy Antibiotic Note  Isabella [name] is a 45 y.o. female admitted on 07/19/2020 with catatonic schizophrenia s/p ECT treatment x 1 07/22/20. Following return from ECT, patient with fever, redness, and drainage in her eyes. Per interpreter, patient also complaining of abdominal pain. Patient has been refusing lab draws. Patient transferred to medical floor. Pharmacy has been consulted for vancomycin and cefepime dosing for sepsis of known origin.   Assessment:  Patient creatinine has improved 1.75>1.00 as of 3/10 (baseline ~0.85). AKI improved, will start on scheduled maintenance regimen at this time for vancomycin. Remains afebrile. Urine culture and blood culture still pending. Day 2 of cefepime, day 3 of flagyl.   WBC Trend:  14.5 - 3/6 17.6 - 3/9 16.0 - 3/10  Weight: 76.8 kg (169 lb 5 oz)  IBW: 54.7 kg (120 lb 8 oz)   Temp (24hrs), Avg:99.1 F (37.3 C), Min:98.4 F (36.9 C), Max:100 F (37.8 C)  Recent Labs  Lab 07/19/20 1423 07/22/20 1434 07/22/20 1822 07/22/20 2132 07/23/20 0625  WBC 14.5* 17.6*  --   --  16.0*  CREATININE 0.83 1.75*  --   --  1.00  LATICACIDVEN  --   --  1.5 1.3  --     Estimated CrCl (by C-G formula based on SCr of 1 mg/dL and using IBW): 60.7 mL/min    No Known Allergies   Plan: - Increased dosing frequency of Cefepime, previous 2 grams Q12H, now 2 grams Q8H based on improving renal function  - Vancomycin 1750 mg loading dose (25 mg/kg) x 1 (complete)  Maintenance dose based on IBW: 1500 mg Q24H, infusion time of 1.5 hours, start infusion at 22:00 on 3/10  AUC: 495.2 (goal of 400-550)   Cmin: 10.2 (goal >/= 10)   Cmax: 37.1  - Plan to collect vancomycin level in 4-5 days  - Continue Flagyl 500 mg Q8H  - Follow up renal function tomorrow AM for dose adjustments - Follow up urine and blood cultures for de-escalation    Antimicrobials this admission: 3/9 cefepime >>  3/9 vancomycin >>  3/9 Metronidazole >>  Dose adjustments this  admission: Cefepime:   - 3/9: 2 grams Q12H  - 3/10 2 grams Q8H  Microbiology results: 3/9 BCx: sent, pending 3/9 UCx: sent, pending  Thank you for allowing pharmacy to be a part of this patient's care.  Doreatha Massed, Student-PharmD 07/23/2020 1:30 PM

## 2020-07-23 NOTE — [facility] Counselor (Signed)
Pt was discharged to medical floor and assessment was unable to be completed.   Chalmers Guest. Guerry Bruin, MSW, [place], [place] 07/23/2020 9:08 AM

## 2020-07-23 NOTE — Progress Notes (Signed)
Pt refusing to take any PO meds/MD made aware/will consult with psych for different administration route. Will continue to monitor.

## 2020-07-24 ENCOUNTER — Encounter: Payer: Self-pay | Admitting: Anesthesiology

## 2020-07-24 LAB — URINE CULTURE: Culture: 20000 — AB

## 2020-07-24 LAB — CBC
HCT: 45.4 % (ref 36.0–46.0)
Hemoglobin: 14.8 g/dL (ref 12.0–15.0)
MCH: 31.1 pg (ref 26.0–34.0)
MCHC: 32.6 g/dL (ref 30.0–36.0)
MCV: 95.4 fL (ref 80.0–100.0)
Platelets: 290 10*3/uL (ref 150–400)
RBC: 4.76 MIL/uL (ref 3.87–5.11)
RDW: 12.8 % (ref 11.5–15.5)
WBC: 13.5 10*3/uL — ABNORMAL HIGH (ref 4.0–10.5)
nRBC: 0 % (ref 0.0–0.2)

## 2020-07-24 LAB — HEPATIC FUNCTION PANEL
ALT: 41 U/L (ref 0–44)
AST: 30 U/L (ref 15–41)
Albumin: 3.5 g/dL (ref 3.5–5.0)
Alkaline Phosphatase: 59 U/L (ref 38–126)
Bilirubin, Direct: 0.2 mg/dL (ref 0.0–0.2)
Indirect Bilirubin: 1.3 mg/dL — ABNORMAL HIGH (ref 0.3–0.9)
Total Bilirubin: 1.5 mg/dL — ABNORMAL HIGH (ref 0.3–1.2)
Total Protein: 7 g/dL (ref 6.5–8.1)

## 2020-07-24 LAB — BASIC METABOLIC PANEL
Anion gap: 9 (ref 5–15)
BUN: 33 mg/dL — ABNORMAL HIGH (ref 6–20)
CO2: 20 mmol/L — ABNORMAL LOW (ref 22–32)
Calcium: 9 mg/dL (ref 8.9–10.3)
Chloride: 125 mmol/L — ABNORMAL HIGH (ref 98–111)
Creatinine, Ser: 0.84 mg/dL (ref 0.44–1.00)
GFR, Estimated: >60 mL/min (ref >60)
Glucose, Bld: 95 mg/dL (ref 70–99)
Potassium: 3.5 mmol/L (ref 3.5–5.1)
Sodium: 154 mmol/L — ABNORMAL HIGH (ref 135–145)

## 2020-07-24 LAB — PHOSPHORUS: Phosphorus: 2.8 mg/dL (ref 2.5–4.6)

## 2020-07-24 LAB — MAGNESIUM: Magnesium: 2.1 mg/dL (ref 1.7–2.4)

## 2020-07-24 MED ORDER — HALOPERIDOL LACTATE 5 MG/ML IJ SOLN
INTRAMUSCULAR | Status: AC
  Filled 2020-07-24: qty 1

## 2020-07-24 MED ORDER — SODIUM BICARBONATE 8.4 % IV SOLN
50.0000 meq | Freq: Once | INTRAVENOUS | Status: AC
  Administered 2020-07-24: 50 meq via INTRAVENOUS
  Filled 2020-07-24: qty 50

## 2020-07-24 MED ORDER — SODIUM CHLORIDE 0.9 % IV SOLN
1.0000 g | INTRAVENOUS | Status: DC
  Administered 2020-07-24: 1 g via INTRAVENOUS
  Filled 2020-07-24 (×2): qty 10

## 2020-07-24 MED ORDER — DEXTROSE 5 % IV SOLN: INTRAVENOUS | Status: DC

## 2020-07-24 MED ORDER — METOPROLOL TARTRATE 5 MG/5ML IV SOLN
5.0000 mg | Freq: Two times a day (BID) | INTRAVENOUS | Status: DC | PRN
  Administered 2020-07-24 – 2020-07-25 (×2): 5 mg via INTRAVENOUS
  Filled 2020-07-24 (×2): qty 5

## 2020-07-24 MED ORDER — HALOPERIDOL LACTATE 5 MG/ML IJ SOLN
2.0000 mg | Freq: Once | INTRAMUSCULAR | Status: AC
  Administered 2020-07-25: 2 mg via INTRAVENOUS
  Filled 2020-07-24: qty 1

## 2020-07-24 MED ORDER — HYDRALAZINE HCL 20 MG/ML IJ SOLN
5.0000 mg | Freq: Four times a day (QID) | INTRAMUSCULAR | Status: DC | PRN
  Administered 2020-07-24 – 2020-07-26 (×2): 5 mg via INTRAVENOUS
  Filled 2020-07-24 (×2): qty 1

## 2020-07-24 MED ORDER — BENZTROPINE MESYLATE 1 MG/ML IJ SOLN
1.0000 mg | Freq: Two times a day (BID) | INTRAMUSCULAR | Status: AC
  Administered 2020-07-24 – 2020-07-26 (×6): 1 mg via INTRAVENOUS
  Filled 2020-07-24 (×7): qty 1

## 2020-07-24 NOTE — Consult Note (Addendum)
Pharmacy Antibiotic Note  Isabella [name] is a 45 y.o. female admitted on 07/19/2020 with catatonic schizophrenia s/p ECT treatment x 1 07/22/20. Following return from ECT, patient with fever, redness, and drainage in her eyes. Per interpreter, patient also complaining of abdominal pain and flank pain. Patient has been refusing lab draws. Patient transferred to medical floor. Pharmacy has been consulted for vancomycin and cefepime dosing for sepsis of known origin.   Assessment:  Patient creatinine has improved 1.75>1.00>0.84 as of 3/11 (baseline ~0.85). AKI resolved. Blood culture showed no growth. Urine culture had lactobacilli present, likely due to contamination. Pt remains afebrile and WBC count trending down. Flagyl x 3 days and vancomycin x 3 days are now d/c due to negative cultures. Repeat blood culture ordered today. De-escalated to cefepime (Day 3) for suspected UTI.   WBC Trend:  14.5 - 3/6 17.6 - 3/9 16.0 - 3/10 13.5 - 3/11  Plan: - Continue cefepime 2 grams Q8H based on current renal function  - Follow up renal function AM tomorrow - Monitor WBC daily  - Follow up blood culture  Weight: 76.8 kg (169 lb 5 oz)  IBW: 54.7 kg (120 lb 8 oz)   Temp (24hrs), Avg:99.1 F (37.3 C), Min:98.4 F (36.9 C), Max:100 F (37.8 C)  Recent Labs:  Lab 07/19/20 1423 07/22/20 1434 07/22/20 1822 07/22/20 2132 07/23/20 0625 07/24/20 0509  WBC 14.5* 17.6*  --   --  16.0* 13.5*  CREATININE 0.83 1.75*  --   --  1.00 0.84  LATICACIDVEN  --   --  1.5 1.3  --   --     Estimated CrCl (by C-G formula based on SCr of 0.84 mg/dL using TBW): 101 mL/min  - IBW: 83.9 mL/min  No Known Allergies    Antimicrobials this admission: 3/9 cefepime >>  3/9 vancomycin >> 3/11 3/9 Metronidazole >>3/11  Dose adjustments this admission: Cefepime:   - 3/9: 2 grams Q12H  - 3/10 2 grams Q8H  Microbiology results: 3/09 BCx: no growth to date 3/11 BCx: pending   3/11 UCx: lactobacillus present,  likely contaminant  Thank you for allowing pharmacy to be a part of this patient's care.  Doreatha Massed, Student-PharmD 07/24/2020 1:11 PM

## 2020-07-24 NOTE — Progress Notes (Signed)
RN called to room by pts safety sitter/ pt becoming very aggravated and restless/ having visual hallucinations/ MD called to make aware/ med ordered/ will continue to monitor

## 2020-07-24 NOTE — Progress Notes (Addendum)
[facility] Progress Note  Patient: Isabella [name]    HMC:[id number]  DOA: 07/22/2020     Date of Service: the patient was seen and examined on 07/24/2020  No chief complaint on file.  Brief hospital course:  Ndea Kilroy is a 45 y.o. female with medical history significant of hypertension, schizophrenia, bipolar disorder, tobacco abuse, migraine headache, who presents with fever.  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 Per Dr. Domingo Cocking, pt has 2-3 week period of catatonia, not speaking, not moving, and without adequate intake of food or water. Pt is s/p of ECT treatment today. Pt was found to have fever or 101.4 today, with unclear source of infection.  I called her relatives by phone, and was told that patient complains of left abdominal/flank pain recently. When I saw patient in [facility] units, she is not not talking to the iPad translator and does not answer any questions clearly.  No active respiratory distress, nausea, vomiting, diarrhea noted.  Not sure if patient has any suicidal or homicidal ideations.  ED Course: pt was found to have WBC 17.6, on admission, patient had alcohol level less than 10, salicylate level less than 7, negative Covid PCR 07/19/2020, abnormal liver function with ALP 96, AST 78, ALT 74, total bilirubin 1.8, AKI with creatinine 1.75, BUN 58 (creatinine 0.83 on 07/19/2020), temperature 101.4, blood pressure 115/78, heart rate up to 140s -->120 -130s, RR 19, oxygen saturation 93-95% on room air.   Assessment and Plan:  # Sepsis secondary to UTI,  Sepsis criteria UTI, leukocytosis, fever, tachycardia, tachypnea Patient received 2 L NS bolus in the ED, acid 1.3 Leukocytosis WBC 16--13.5, continue to trend S/p vancomycin, cefepime and metronidazole d/c;d 3/11 and started ceftriaxone 1 g IV daily for 5 days urinalysis shows bacteriuria, wound culture <20K lactobacillus species,  susceptibility not available   # Hypernatremia most likely due to dehydration and IV fluid given for resuscitation Sodium 150--154  continue to trend DC'd NS and half NS, started D5 75 mill per hour  Monitor sodium daily   # Metabolic acidosis,  S/p NaBicarb 50 mEq IV x2 Bicarb improving, oral bicarb ordered if patient is able to take if not then use IV [place] as needed Check chemistry daily   # Catatonic schizophrenia, Social anxiety disorder, depression s/p of ECT. -continue Prozac, olanzapine and trazodone Follow psych for further recommendation Continue fall precaution and aspiration precaution One-to-one observation Use Haldol as needed 3/11 started Cogentin 1 mg IV twice daily for 3 days for rigidity   # LFTs abnormal: Resolved Patient had a negative hepatitis panel -US-RUQ: Cholelithiasis and sludge in gallbladder. No appreciable gallbladder wall thickening or pericholecystic fluid. Hepatic steatosis. -Careful use Tylenol (Cannot use ibuprofen due to AKI)   # HTN (hypertension): Blood pressure elevated --Use IV Lopressor for hypertension and tachycardia otherwise use hydralazine as needed   AKI (acute kidney injury) Resolved  -CT-renal stone, no obstructive uropathy or stones, gallbladder sludge or stone, hepatic cirrhosis,  recommended liver sonogram.   Tobacco abuse -nicotine patch   # Fibroid uterus found on CT scan, recommend to follow with GYN as an outpatient CT: Enlarged uterus with multiple fibroids, including a dominant 8.7 cm fibroid about the left fundus. There is also a pedunculated fibroid extending into the right adnexa.   Body mass index is 29.06 kg/m.        Diet: regular DVT Prophylaxis: Subcutaneous Lovenox   Advance goals of care  discussion: Full code  Family Communication: family was NOT present at bedside, at the time of interview.    Disposition:  Pt is from home, admitted with sepsis and UTI, catatonic schizophrenia, still  has infection on IV antibiotics, hypernatremia, no oral intake, which precludes a safe discharge. Discharge to psych unit, when medically stable, may need 2 -3 days more stay.  Subjective: No overnight issues, as per RN patient asked her about the food she was hungry but she did not communicated with me at all.  Patient seems to be having school catatonic schizophrenia, unable to follow any commands.  Patient is unable to offer any complaints.    Physical Exam: General:  Awake but catatonic/mute, laying comfortably Eyes: PERRLA ENT: Oral Mucosa Clear, moist  Neck: no JVD,  Cardiovascular: S1 and S2 Present, no Murmur,  Respiratory: good respiratory effort, Bilateral Air entry equal and Decreased, no Crackles, no wheezes Abdomen: Bowel Sound present, Soft and no tenderness,  Skin: no rashes Extremities: no Pedal edema, no calf tenderness Neurologic: Spontaneously moving upper and lower extremities, rigidity b/l LE > UE Gait not checked due to patient safety concerns  Vitals:   07/24/20 0451 07/24/20 0500 07/24/20 0808 07/24/20 1213  BP: (!) 150/99  (!) 156/107 129/77  Pulse: (!) 108  (!) 109 (!) 126  Resp: 20     Temp: 99.6 F (37.6 C)  100.1 F (37.8 C) 99.5 F (37.5 C)  TempSrc:   Axillary Oral  SpO2: 98%  96% 98%  Weight:  76.8 kg      Intake/Output Summary (Last 24 hours) at 07/24/2020 1435 Last data filed at 07/24/2020 1328 Gross per 24 hour  Intake 1470.34 ml  Output 700 ml  Net 770.34 ml   Filed Weights   07/23/20 0500 07/24/20 0500  Weight: 76.8 kg 76.8 kg    Data Reviewed: I have personally reviewed and interpreted daily labs, tele strips, imagings as discussed above. I reviewed all nursing notes, pharmacy notes, vitals, pertinent old records I have discussed plan of care as described above with RN and patient/family.  CBC: Recent Labs  Lab 07/19/20 1423 07/22/20 1434 07/23/20 0625 07/24/20 0509  WBC 14.5* 17.6* 16.0* 13.5*  HGB 17.7* 17.5* 16.0* 14.8   HCT 53.0* 52.6* 49.6* 45.4  MCV 93.8 94.3 95.0 95.4  PLT 388 374 322 073   Basic Metabolic Panel: Recent Labs  Lab 07/19/20 1423 07/22/20 1434 07/23/20 0625 07/24/20 0509  NA 139 147* 150* 154*  K 4.1 4.1 3.5 3.5  CL 108 115* 121* 125*  CO2 17* 17* 15* 20*  GLUCOSE 127* 144* 109* 95  BUN 29* 58* 49* 33*  CREATININE 0.83 1.75* 1.00 0.84  CALCIUM 9.8 9.7 9.3 9.0  MG  --  2.4 2.2 2.1  PHOS  --  3.6 3.5 2.8    Studies: No results found.  Scheduled Meds: . benztropine mesylate  1 mg Intravenous BID  . enoxaparin (LOVENOX) injection  40 mg Subcutaneous QPM  . haloperidol lactate  2 mg Intravenous Once  . nicotine  21 mg Transdermal Daily  . sodium bicarbonate  650 mg Oral TID   Continuous Infusions: . cefTRIAXone (ROCEPHIN)  IV    . dextrose 75 mL/hr at 07/24/20 1115   PRN Meds: acetaminophen, acetaminophen, hydrALAZINE, metoprolol tartrate, ondansetron (ZOFRAN) IV  Time spent: 35 minutes  Author: Val Riles. MD [facility] 07/24/2020 2:35 PM  To reach On-call, see care teams to locate the attending and reach out to them via  [email or web address] If 7PM-7AM, please contact night-coverage If you still have difficulty reaching the attending provider, please page the [facility] (Director on Call) for [facility] on amion for assistance.
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
## 2020-07-25 LAB — CBC
HCT: 43 % (ref 36.0–46.0)
Hemoglobin: 14 g/dL (ref 12.0–15.0)
MCH: 31.3 pg (ref 26.0–34.0)
MCHC: 32.6 g/dL (ref 30.0–36.0)
MCV: 96 fL (ref 80.0–100.0)
Platelets: 275 10*3/uL (ref 150–400)
RBC: 4.48 MIL/uL (ref 3.87–5.11)
RDW: 13.1 % (ref 11.5–15.5)
WBC: 13.6 10*3/uL — ABNORMAL HIGH (ref 4.0–10.5)
nRBC: 0 % (ref 0.0–0.2)

## 2020-07-25 LAB — BASIC METABOLIC PANEL
Anion gap: 10 (ref 5–15)
BUN: 23 mg/dL — ABNORMAL HIGH (ref 6–20)
CO2: 21 mmol/L — ABNORMAL LOW (ref 22–32)
Calcium: 8.8 mg/dL — ABNORMAL LOW (ref 8.9–10.3)
Chloride: 123 mmol/L — ABNORMAL HIGH (ref 98–111)
Creatinine, Ser: 0.81 mg/dL (ref 0.44–1.00)
GFR, Estimated: >60 mL/min (ref >60)
Glucose, Bld: 112 mg/dL — ABNORMAL HIGH (ref 70–99)
Potassium: 3.2 mmol/L — ABNORMAL LOW (ref 3.5–5.1)
Sodium: 154 mmol/L — ABNORMAL HIGH (ref 135–145)

## 2020-07-25 LAB — HEPATIC FUNCTION PANEL
ALT: 30 U/L (ref 0–44)
AST: 25 U/L (ref 15–41)
Albumin: 3.4 g/dL — ABNORMAL LOW (ref 3.5–5.0)
Alkaline Phosphatase: 56 U/L (ref 38–126)
Bilirubin, Direct: 0.2 mg/dL (ref 0.0–0.2)
Indirect Bilirubin: 0.9 mg/dL (ref 0.3–0.9)
Total Bilirubin: 1.1 mg/dL (ref 0.3–1.2)
Total Protein: 6.7 g/dL (ref 6.5–8.1)

## 2020-07-25 MED ORDER — FREE WATER: 300.0000 mL | Freq: Four times a day (QID) | Status: DC

## 2020-07-25 MED ORDER — POTASSIUM CHLORIDE CRYS ER 20 MEQ PO TBCR: 40.0000 meq | EXTENDED_RELEASE_TABLET | Freq: Once | ORAL | Status: DC

## 2020-07-25 MED ORDER — SODIUM CHLORIDE 0.9 % IV SOLN
2.0000 g | INTRAVENOUS | Status: DC
  Administered 2020-07-25: 2 g via INTRAVENOUS
  Filled 2020-07-25: qty 2
  Filled 2020-07-25: qty 20

## 2020-07-25 NOTE — Plan of Care (Signed)
  Problem: Education: Goal: Knowledge of General Education information will improve Description: Including pain rating scale, medication(s)/side effects and non-pharmacologic comfort measures Outcome: Not Progressing   Problem: Health Behavior/Discharge Planning: Goal: Ability to manage health-related needs will improve Outcome: Not Progressing   Problem: Clinical Measurements: Goal: Ability to maintain clinical measurements within normal limits will improve Outcome: Not Progressing   Problem: Nutrition: Goal: Adequate nutrition will be maintained Outcome: Not Progressing: Pt will not open mouth to take anything . Poor Po intake   Problem: Elimination: Goal: Will not experience complications related to bowel motility Outcome: Not Progressing: pt unable to void , IN/OUT performed returned 900 mls of amber color urine.

## 2020-07-25 NOTE — Progress Notes (Signed)
PROGRESS NOTE    Isabella [name]  LKG:[id number] DOB: 09/06/1975 DOA: 07/22/2020 PCP: Patient, No Pcp Per   Chief Complain: Fever  Brief Narrative: Patient is a 45 year old female with history of hypertension, schizophrenia, bipolar disorder, drug abuse, migraine headache who presents with fever from behavioral health unit.  She was eventually admitted there for the management of schizophrenia after she had 2 to 3-weeks episode of catatonia.  She is status post ECT.  Patient was found to be febrile at behavioral health unit and was admitted under [facility] service.  Lab work showed leukocytosis, mild elevated LFTs, AKI with creatinine of 1.7.  Blood pressure was stable, she was tachycardic.  Patient was admitted for the suspicion of sepsis.  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  Received 2 L of normal saline bolus in the emergency department.  Lactate was normal.  She had elevated leukocytes.  She was treated with vancomycin oxygen, Flagyl, and these antibiotics have been discontinued and she has been on ceftriaxone for now.  Urinalysis showed bacteriuria but showed less than 20 K lactobacillus species.  Blood cultures have been negative so far. Patient still has mild grade  fever.  We will continue with ceftriaxone at 2 g daily. CT stone study did not show any urinary obstruction, stones or pyelonephritis.  No evidence of cholecystitis as per ultrasound of the gallbladder.  AKI: Resolved with IV fluids  Catatonic schizophrenia/social anxiety disorder/depression: Was admitted under psychiatry service.  Status post ECT.  She was on Prozac,  olanzapine at home, trazodone.  Psychiatry closely following.  Continue one-to-one observation.  On Cogentin 1 mg twice a day for rigidity. Next ECT plan on Monday  Elevated LFTs: Resolved.  Ultrasound of the gallbladder showed hepatic steatosis  Hypernatremia: Likely from dehydration or normal saline.  Continue D5 .  Check BMP tomorrow  Hypertension: Monitor blood pressure.  Continue current medications  Tobacco abuse: Continue nicotine patch  Fibroids: As per CT scan.  CT imaging showed a large uterus with multiple fibroids.  We recommend to follow-up with GYN as an outpatient.            DVT prophylaxis:Lovenox Code Status: Full Family Communication: None at bed side Status is: Inpatient  Remains inpatient appropriate because:Inpatient level of care appropriate due to severity of illness   Dispo: The patient is from: Behavioral health              Anticipated d/c is to: Behavioral health              Patient currently is not medically stable to d/c.   Difficult to place patient No     Consultants: Psychiatry  Procedures:ECT  Antimicrobials:  Anti-infectives (From admission, onward)   Start     Dose/Rate Route Frequency Ordered Stop   07/25/20 2200  cefTRIAXone (ROCEPHIN) 2 g in sodium chloride 0.9 % 100 mL IVPB        2 g 200 mL/hr over 30 Minutes Intravenous Every 24 hours 07/25/20 1238 07/29/20 2159   07/24/20 1900  cefTRIAXone (ROCEPHIN) 1 g in sodium chloride 0.9 % 100 mL IVPB  Status:  Discontinued  1 g 200 mL/hr over 30 Minutes Intravenous Every 24 hours 07/24/20 1431 07/25/20 1238   07/23/20 2200  vancomycin (VANCOREADY) IVPB 1500 mg/300 mL  Status:  Discontinued        1,500 mg 150 mL/hr over 120 Minutes Intravenous Every 24 hours 07/23/20 1432 07/24/20 1045   07/23/20 0900  ceFEPIme (MAXIPIME) 2 g in sodium chloride 0.9 % 100 mL IVPB  Status:  Discontinued        2 g 200 mL/hr over 30 Minutes Intravenous Every 8 hours 07/23/20 0800 07/24/20  1431   07/23/20 0804  vancomycin variable dose per unstable renal function (pharmacist dosing)  Status:  Discontinued         Does not apply See admin instructions 07/23/20 0804 07/23/20 1432   07/22/20 2000  vancomycin (VANCOREADY) IVPB 1750 mg/350 mL        1,750 mg 175 mL/hr over 120 Minutes Intravenous  Once 07/22/20 1800 07/22/20 2258   07/22/20 1900  metroNIDAZOLE (FLAGYL) IVPB 500 mg  Status:  Discontinued        500 mg 100 mL/hr over 60 Minutes Intravenous Every 8 hours 07/22/20 1758 07/24/20 1045   07/22/20 1845  metroNIDAZOLE (FLAGYL) IVPB 500 mg  Status:  Discontinued        500 mg 100 mL/hr over 60 Minutes Intravenous Every 8 hours 07/22/20 1754 07/22/20 1758   07/22/20 1845  ceFEPIme (MAXIPIME) 2 g in sodium chloride 0.9 % 100 mL IVPB  Status:  Discontinued        2 g 200 mL/hr over 30 Minutes Intravenous Every 12 hours 07/22/20 1758 07/23/20 0800      Subjective: Patient seen and examined the bedside this morning.  She was standing when I entered the room.  She is not coherent, not oriented.  Denies any complaints.SItter at bedside  Objective: Vitals:   07/25/20 0003 07/25/20 0343 07/25/20 0802 07/25/20 1006  BP: (!) 157/93 (!) 148/93 (!) 141/84 (!) 162/95  Pulse: (!) 106 (!) 109 (!) 117 (!) 107  Resp: 17 17 20 20   Temp: 99 F (37.2 C) 98.4 F (36.9 C) (!) 100.8 F (38.2 C) 100.2 F (37.9 C)  TempSrc: Oral Oral Oral Axillary  SpO2: 100% 96% 92% 94%  Weight: 88.1 kg       Intake/Output Summary (Last 24 hours) at 07/25/2020 1240 Last data filed at 07/25/2020 1049 Gross per 24 hour  Intake 1563.12 ml  Output 1500 ml  Net 63.12 ml   Filed Weights   07/23/20 0500 07/24/20 0500 07/25/20 0003  Weight: 76.8 kg 76.8 kg 88.1 kg    Examination:  General exam: standing, not on distress HEENT:PERRL,Oral mucosa moist, Ear/Nose normal on gross exam Respiratory system: Bilateral equal air entry, normal vesicular breath sounds, no wheezes or crackles  Cardiovascular  system: Sinus tach, No JVD, murmurs, rubs, gallops or clicks. No pedal edema. Gastrointestinal system: Abdomen is nondistended, soft and nontender. No organomegaly or masses felt. Normal bowel sounds heard. Central nervous system: Alert but not oriented Extremities: No edema, no clubbing ,no cyanosis Skin: No rashes, lesions or ulcers,no icterus ,no pallor Psychiatry: Judgement and insight appear impaired,flat affect.     Data Reviewed: I have personally reviewed following labs and imaging studies  CBC: Recent Labs  Lab 07/19/20 1423 07/22/20 1434 07/23/20 0625 07/24/20 0509 07/25/20 0353  WBC 14.5* 17.6* 16.0* 13.5* 13.6*  HGB 17.7* 17.5* 16.0* 14.8 14.0  HCT 53.0* 52.6* 49.6* 45.4 43.0  MCV 93.8 94.3 95.0 95.4 96.0  PLT 388 374 322 290 536   Basic Metabolic Panel: Recent Labs  Lab 07/19/20 1423 07/22/20 1434 07/23/20 0625 07/24/20 0509 07/25/20 0353  NA 139 147* 150* 154* 154*  K 4.1 4.1 3.5 3.5 3.2*  CL 108 115* 121* 125* 123*  CO2 17* 17* 15* 20* 21*  GLUCOSE 127* 144* 109* 95 112*  BUN 29* 58* 49* 33* 23*  CREATININE 0.83 1.75* 1.00 0.84 0.81  CALCIUM 9.8 9.7 9.3 9.0 8.8*  MG  --  2.4 2.2 2.1  --   PHOS  --  3.6 3.5 2.8  --    GFR: Estimated Creatinine Clearance: 93.3 mL/min (by C-G formula based on SCr of 0.81 mg/dL). Liver Function Tests: Recent Labs  Lab 07/19/20 1423 07/22/20 1434 07/24/20 0509 07/25/20 0353  AST 68* 78* 30 25  ALT 65* 74* 41 30  ALKPHOS 86 96 59 56  BILITOT 1.6* 1.8* 1.5* 1.1  PROT 9.2* 8.9* 7.0 6.7  ALBUMIN 4.7 4.4 3.5 3.4*   No results for input(s): LIPASE, AMYLASE in the last 168 hours. No results for input(s): AMMONIA in the last 168 hours. Coagulation Profile: Recent Labs  Lab 07/22/20 1822  INR 1.2   Cardiac Enzymes: No results for input(s): CKTOTAL, CKMB, CKMBINDEX, TROPONINI in the last 168 hours. BNP (last 3 results) No results for input(s): PROBNP in the last 8760 hours. HbA1C: No results for input(s):  HGBA1C in the last 72 hours. CBG: Recent Labs  Lab 07/22/20 0654  GLUCAP 128*   Lipid Profile: No results for input(s): CHOL, HDL, LDLCALC, TRIG, CHOLHDL, LDLDIRECT in the last 72 hours. Thyroid Function Tests: No results for input(s): TSH, T4TOTAL, FREET4, T3FREE, THYROIDAB in the last 72 hours. Anemia Panel: No results for input(s): VITAMINB12, FOLATE, FERRITIN, TIBC, IRON, RETICCTPCT in the last 72 hours. Sepsis Labs: Recent Labs  Lab 07/22/20 1822 07/22/20 2132  PROCALCITON 2.56  --   LATICACIDVEN 1.5 1.3    Recent Results (from the past 240 hour(s))  Resp Panel by RT-PCR (Flu A&B, Covid) Nasopharyngeal Swab     Status: None   Collection Time: 07/19/20  3:52 PM   Specimen: Nasopharyngeal Swab; Nasopharyngeal(NP) swabs in vial transport medium  Result Value Ref Range Status   SARS Coronavirus 2 by RT PCR NEGATIVE NEGATIVE Final    Comment: (NOTE) SARS-CoV-2 target nucleic acids are NOT DETECTED.  The SARS-CoV-2 RNA is generally detectable in upper respiratory specimens during the acute phase of infection. The lowest concentration of SARS-CoV-2 viral copies this assay can detect is 138 copies/mL. A negative result does not preclude SARS-Cov-2 infection and should not be used as the sole basis for treatment or other patient management decisions. A negative result may occur with  improper specimen collection/handling, submission of specimen other than nasopharyngeal swab, presence of viral mutation(s) within the areas targeted by this assay, and inadequate number of viral copies(<138 copies/mL). A negative result must be combined with clinical observations, patient history, and epidemiological information. The expected result is Negative.  Fact Sheet for Patients:  EntrepreneurPulse.com.au  Fact Sheet for Healthcare Providers:  IncredibleEmployment.be  This test is no t yet approved or cleared by the [place] FDA and  has been  authorized for detection and/or diagnosis of SARS-CoV-2 by FDA under an Emergency Use Authorization (EUA). This EUA will remain  in effect (meaning this test can be used) for the duration of the COVID-19 declaration under Section 564(b)(1) of the Act, 21 U.S.C.section 360bbb-3(b)(1), unless the authorization is terminated  or  revoked sooner.       Influenza A by PCR NEGATIVE NEGATIVE Final   Influenza B by PCR NEGATIVE NEGATIVE Final    Comment: (NOTE) The Xpert Xpress SARS-CoV-2/FLU/RSV plus assay is intended as an aid in the diagnosis of influenza from Nasopharyngeal swab specimens and should not be used as a sole basis for treatment. Nasal washings and aspirates are unacceptable for Xpert Xpress SARS-CoV-2/FLU/RSV testing.  Fact Sheet for Patients: EntrepreneurPulse.com.au  Fact Sheet for Healthcare Providers: IncredibleEmployment.be  This test is not yet approved or cleared by the [place] FDA and has been authorized for detection and/or diagnosis of SARS-CoV-2 by FDA under an Emergency Use Authorization (EUA). This EUA will remain in effect (meaning this test can be used) for the duration of the COVID-19 declaration under Section 564(b)(1) of the Act, 21 U.S.C. section 360bbb-3(b)(1), unless the authorization is terminated or revoked.  Performed at [facility], [address] [place], [place] [address]   CULTURE, BLOOD (ROUTINE X 2) w Reflex to ID Panel     Status: None (Preliminary result)   Collection Time: 07/22/20  6:22 PM   Specimen: BLOOD  Result Value Ref Range Status   Specimen Description BLOOD BLOOD LEFT HAND  Final   Special Requests   Final    BOTTLES DRAWN AEROBIC AND ANAEROBIC Blood Culture adequate volume   Culture   Final    NO GROWTH 3 DAYS Performed at [facility], [address] [place], [place] [address]    Report Status PENDING  Incomplete  CULTURE, BLOOD (ROUTINE X 2) w Reflex to  ID Panel     Status: None (Preliminary result)   Collection Time: 07/22/20  6:23 PM   Specimen: BLOOD  Result Value Ref Range Status   Specimen Description BLOOD RIGHT ANTECUBITAL  Final   Special Requests   Final    BOTTLES DRAWN AEROBIC AND ANAEROBIC Blood Culture adequate volume   Culture   Final    NO GROWTH 3 DAYS Performed at [facility], [address] [place], [place] [address]    Report Status PENDING  Incomplete  Urine Culture     Status: Abnormal   Collection Time: 07/22/20  6:30 PM   Specimen: Urine, Random  Result Value Ref Range Status   Specimen Description   Final    URINE, RANDOM Performed at [facility], [address] [place], [place] [address]    Special Requests   Final    NONE Performed at [facility], [address] [place], [place] [address]    Culture (A)  Final    20,000 COLONIES/mL LACTOBACILLUS SPECIES Standardized susceptibility testing for this organism is not available. Performed at [facility], [place] [address], [place], [place] [address]    Report Status 07/24/2020 FINAL  Final  CULTURE, BLOOD (ROUTINE X 2) w Reflex to ID Panel     Status: None (Preliminary result)   Collection Time: 07/24/20  9:19 AM   Specimen: BLOOD  Result Value Ref Range Status   Specimen Description BLOOD BLOOD LEFT HAND  Final   Special Requests   Final    BOTTLES DRAWN AEROBIC AND ANAEROBIC Blood Culture adequate volume   Culture   Final    NO GROWTH < 24 HOURS Performed at [facility], [address] [place], [place] [address]    Report Status PENDING  Incomplete  CULTURE, BLOOD (ROUTINE X 2) w Reflex to ID Panel  Status: None (Preliminary result)   Collection Time: 07/24/20  9:19 AM   Specimen: BLOOD  Result Value Ref Range Status   Specimen Description BLOOD BLOOD RIGHT HAND  Final   Special Requests   Final    BOTTLES DRAWN AEROBIC AND ANAEROBIC Blood Culture adequate volume   Culture   Final    NO  GROWTH < 24 HOURS Performed at [facility], [address] [place], [place] [address]    Report Status PENDING  Incomplete         Radiology Studies: No results found.      Scheduled Meds: . benztropine mesylate  1 mg Intravenous BID  . enoxaparin (LOVENOX) injection  40 mg Subcutaneous QPM  . free water  300 mL Per Tube Q6H  . haloperidol lactate  2 mg Intravenous Once  . nicotine  21 mg Transdermal Daily  . potassium chloride  40 mEq Oral Once   Continuous Infusions: . cefTRIAXone (ROCEPHIN)  IV    . dextrose 100 mL/hr at 07/25/20 0823     LOS: 3 days    Time spent: 35 mins.More than 50% of that time was spent in counseling and/or coordination of care.      Shelly Coss, MD [facility] P3/04/2021, 12:40 PM

## 2020-07-25 NOTE — Progress Notes (Signed)
Mobility Specialist - Progress Note   07/25/20 1350  Mobility  Activity Ambulated in room  Level of Assistance Minimal assist, patient does 75% or more  Assistive Device None  Distance Ambulated (ft) 50 ft  Mobility Response Tolerated well  Mobility performed by Mobility specialist  $Mobility charge 1 Mobility    Pt standing at bedside with sitter present. Interpreter [phone] on phone. Pt restrictive and initially agreeable to ambulation, but then declined. Difficulty translating d/t pt not being very verbal. Pt with poor safety awareness. Max cueing for safe mobility with IV line.     Kathee Delton Mobility Specialist 07/25/20, 1:54 PM

## 2020-07-25 NOTE — Progress Notes (Signed)
   07/25/20 0802  Assess: MEWS Score  Temp (!) 100.8 F (38.2 C)  BP (!) 141/84  Pulse Rate (!) 117  Resp 20  SpO2 92 %  O2 Device Room Air  Assess: MEWS Score  MEWS Temp 1  MEWS Systolic 0  MEWS Pulse 2  MEWS RR 0  MEWS LOC 0  MEWS Score 3  MEWS Score Color Yellow  Assess: if the MEWS score is Yellow or Red  Were vital signs taken at a resting state? Yes  Focused Assessment Change from prior assessment (see assessment flowsheet)  Early Detection of Sepsis Score *See Row Information* High  MEWS guidelines implemented *See Row Information* Yes  Treat  MEWS Interventions Administered prn meds/treatments  Pain Scale PAINAD  Pain Score 0  Breathing 0  Negative Vocalization 0  Facial Expression 0  Body Language 0  Consolability 0  PAINAD Score 0  Take Vital Signs  Increase Vital Sign Frequency  Yellow: Q 2hr X 2 then Q 4hr X 2, if remains yellow, continue Q 4hrs  Escalate  MEWS: Escalate Yellow: discuss with charge nurse/RN and consider discussing with provider and RRT  Notify: Charge Nurse/RN  Name of Charge Nurse/RN Notified Britt Bolognese, RN  Date Charge Nurse/RN Notified 07/25/20  Time Charge Nurse/RN Notified 0810  Document  Patient Outcome Stabilized after interventions

## 2020-07-25 NOTE — Progress Notes (Signed)
Pt would not let me put the monitor for O2 on her finger. Pt refused.

## 2020-07-25 NOTE — Progress Notes (Signed)
While attempting to do 1200 yellow MEWS VS pt is restless and pacing the room not letting us take an accurate set of vital signs. Will take a set when pt calms down. MD made aware. Will continue to monitor.

## 2020-07-26 LAB — BASIC METABOLIC PANEL
Anion gap: 13 (ref 5–15)
BUN: 31 mg/dL — ABNORMAL HIGH (ref 6–20)
CO2: 19 mmol/L — ABNORMAL LOW (ref 22–32)
Calcium: 9.5 mg/dL (ref 8.9–10.3)
Chloride: 113 mmol/L — ABNORMAL HIGH (ref 98–111)
Creatinine, Ser: 1.15 mg/dL — ABNORMAL HIGH (ref 0.44–1.00)
GFR, Estimated: 59 mL/min — ABNORMAL LOW (ref >60)
Glucose, Bld: 137 mg/dL — ABNORMAL HIGH (ref 70–99)
Potassium: 3.4 mmol/L — ABNORMAL LOW (ref 3.5–5.1)
Sodium: 145 mmol/L (ref 135–145)

## 2020-07-26 LAB — CBC
HCT: 47.1 % — ABNORMAL HIGH (ref 36.0–46.0)
Hemoglobin: 15.4 g/dL — ABNORMAL HIGH (ref 12.0–15.0)
MCH: 31.2 pg (ref 26.0–34.0)
MCHC: 32.7 g/dL (ref 30.0–36.0)
MCV: 95.5 fL (ref 80.0–100.0)
Platelets: 283 10*3/uL (ref 150–400)
RBC: 4.93 MIL/uL (ref 3.87–5.11)
RDW: 13 % (ref 11.5–15.5)
WBC: 19.3 10*3/uL — ABNORMAL HIGH (ref 4.0–10.5)
nRBC: 0 % (ref 0.0–0.2)

## 2020-07-26 MED ORDER — HALOPERIDOL LACTATE 5 MG/ML IJ SOLN
INTRAMUSCULAR | Status: AC
  Filled 2020-07-26: qty 1

## 2020-07-26 MED ORDER — HALOPERIDOL LACTATE 5 MG/ML IJ SOLN
5.0000 mg | Freq: Four times a day (QID) | INTRAMUSCULAR | Status: DC | PRN
  Administered 2020-07-26: 5 mg via INTRAVENOUS

## 2020-07-26 MED ORDER — CLONIDINE HCL 0.2 MG/24HR TD PTWK
0.2000 mg | MEDICATED_PATCH | TRANSDERMAL | Status: DC
  Administered 2020-07-26: 0.2 mg via TRANSDERMAL
  Filled 2020-07-26: qty 1

## 2020-07-26 MED ORDER — LABETALOL HCL 5 MG/ML IV SOLN
20.0000 mg | Freq: Once | INTRAVENOUS | Status: AC
  Administered 2020-07-26: 20 mg via INTRAVENOUS
  Filled 2020-07-26: qty 4

## 2020-07-26 MED ORDER — LORAZEPAM 2 MG/ML IJ SOLN
INTRAMUSCULAR | Status: AC
  Filled 2020-07-26: qty 1

## 2020-07-26 MED ORDER — LORAZEPAM 2 MG/ML IJ SOLN
1.0000 mg | INTRAMUSCULAR | Status: DC | PRN
  Administered 2020-07-26: 1 mg via INTRAVENOUS
  Filled 2020-07-26: qty 1

## 2020-07-26 MED ORDER — SODIUM CHLORIDE 0.9 % IV SOLN
1.0000 g | INTRAVENOUS | Status: DC
  Administered 2020-07-26 – 2020-07-27 (×2): 1 g via INTRAVENOUS
  Filled 2020-07-26 (×3): qty 10
  Filled 2020-07-26: qty 1

## 2020-07-26 MED ORDER — LABETALOL HCL 5 MG/ML IV SOLN
10.0000 mg | INTRAVENOUS | Status: DC | PRN
  Administered 2020-07-31: 10 mg via INTRAVENOUS
  Administered 2020-07-31: 5 mg via INTRAVENOUS

## 2020-07-26 MED ORDER — POTASSIUM CHLORIDE CRYS ER 20 MEQ PO TBCR: 40.0000 meq | EXTENDED_RELEASE_TABLET | Freq: Once | ORAL | Status: DC
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
## 2020-07-26 NOTE — Progress Notes (Signed)
Unable to obtain morning VS due to pt restlessness and not following commands.

## 2020-07-26 NOTE — Progress Notes (Signed)
Pt bladder scan resulted 473ml. Pt encouraged to void, unable to follow commands, no change from prior assessment. MD made aware orders to in and out cath.

## 2020-07-26 NOTE — Progress Notes (Addendum)
PROGRESS NOTE    Isabella [name]  GUR:[id number] DOB: July 23, 1975 DOA: 07/22/2020 PCP: Patient, No Pcp Per   Chief Complain: Fever  Brief Narrative: Patient is a 45 year old female with history of hypertension, schizophrenia, bipolar disorder, drug abuse, migraine headache who presents with fever from behavioral health unit.  She was eventually admitted there for the management of schizophrenia after she had 2 to 3-weeks episode of catatonia.  She is status post ECT.  Patient was found to be febrile at behavioral health unit and was admitted under [facility] service.  Lab work showed leukocytosis, mild elevated LFTs, AKI with creatinine of 1.7. Patient was admitted for the suspicion of sepsis.  Sepsis physiology has improved.  She can be transferred to inpatient psychiatric facility whenever appropriate.  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  Received 2 L of normal saline bolus in the emergency department.  Lactate was normal.  She had elevated leukocytes.  She was treated with vancomycin oxygen, Flagyl, and these antibiotics have been discontinued and she has been on ceftriaxone for now.  Urinalysis showed bacteriuria but showed less than 20 K lactobacillus species.  Blood cultures have been negative so far. Patient is afebrile today.  But leukocytosis trending up.  We will continue with ceftriaxone at 1 g daily with aim to discontinue soon. CT stone study did not show any urinary obstruction, stones or pyelonephritis.  No evidence of cholecystitis as per ultrasound of the gallbladder.  AKI: Improved with IV fluids  Hypokalemia: Being  supplemented and monitored.  Catatonic schizophrenia/social anxiety disorder/depression: Was admitted under psychiatry service.  Status post ECT.  She was on Prozac, olanzapine at home, trazodone.  Psychiatry closely following.  Continue one-to-one observation.  On Cogentin 1 mg twice a day for rigidity. Next ECT plan on Monday Patient continues to stand, remaines catatonic.  Continue as needed Haldol/Ativan Patient refusing  to take any oral medications.  Elevated LFTs: Resolved.  Ultrasound of the gallbladder showed hepatic steatosis  Hypernatremia:resolved  Severe hypertension: Likely from catatonia.  Start on clonidine patch, continue as needed medication for severe hypertension.  Tobacco abuse: Continue nicotine patch  Fibroids: As per CT scan.  CT imaging showed a large uterus with multiple fibroids.  We recommend to follow-up with GYN as an outpatient.            DVT prophylaxis:Lovenox Code Status: Full Family Communication: None at bed side Status is: Inpatient  Remains inpatient appropriate because:Inpatient level of care appropriate due to severity of illness   Dispo: The patient is from: Behavioral health              Anticipated d/c is to: Behavioral health              Patient currently is not medically stable to d/c.   Difficult to place patient No     Consultants: Psychiatry  Procedures:ECT  Antimicrobials:  Anti-infectives (From admission, onward)   Start     Dose/Rate Route Frequency Ordered Stop   07/25/20 2200  cefTRIAXone (ROCEPHIN) 2 g in sodium chloride 0.9 % 100 mL IVPB        2 g  200 mL/hr over 30 Minutes Intravenous Every 24 hours 07/25/20 1238 07/29/20 2159   07/24/20 1900  cefTRIAXone (ROCEPHIN) 1 g in sodium chloride 0.9 % 100 mL IVPB  Status:  Discontinued        1 g 200 mL/hr over 30 Minutes Intravenous Every 24 hours 07/24/20 1431 07/25/20 1238   07/23/20 2200  vancomycin (VANCOREADY) IVPB 1500 mg/300 mL  Status:  Discontinued         1,500 mg 150 mL/hr over 120 Minutes Intravenous Every 24 hours 07/23/20 1432 07/24/20 1045   07/23/20 0900  ceFEPIme (MAXIPIME) 2 g in sodium chloride 0.9 % 100 mL IVPB  Status:  Discontinued        2 g 200 mL/hr over 30 Minutes Intravenous Every 8 hours 07/23/20 0800 07/24/20 1431   07/23/20 0804  vancomycin variable dose per unstable renal function (pharmacist dosing)  Status:  Discontinued         Does not apply See admin instructions 07/23/20 0804 07/23/20 1432   07/22/20 2000  vancomycin (VANCOREADY) IVPB 1750 mg/350 mL        1,750 mg 175 mL/hr over 120 Minutes Intravenous  Once 07/22/20 1800 07/22/20 2258   07/22/20 1900  metroNIDAZOLE (FLAGYL) IVPB 500 mg  Status:  Discontinued        500 mg 100 mL/hr over 60 Minutes Intravenous Every 8 hours 07/22/20 1758 07/24/20 1045   07/22/20 1845  metroNIDAZOLE (FLAGYL) IVPB 500 mg  Status:  Discontinued        500 mg 100 mL/hr over 60 Minutes Intravenous Every 8 hours 07/22/20 1754 07/22/20 1758   07/22/20 1845  ceFEPIme (MAXIPIME) 2 g in sodium chloride 0.9 % 100 mL IVPB  Status:  Discontinued        2 g 200 mL/hr over 30 Minutes Intravenous Every 12 hours 07/22/20 1758 07/23/20 0800      Subjective: Patient seen and examined at the bedside this morning.  When I entered the room, she was standing like yesterday, refusing to sit on the bed.  She was severely hypertensive.  Objective: Vitals:   07/25/20 1006 07/25/20 1331 07/25/20 1706 07/25/20 2000  BP: (!) 162/95 (!) 139/110 139/83 (!) 154/127  Pulse: (!) 107 (!) 121 (!) 116   Resp: _0 Temp: 100.2 F (37.9 C) 98.7 F (37.1 C) 98.6 F (37 C)   TempSrc: Axillary Oral Oral   SpO2: 94%     Weight:        Intake/Output Summary (Last 24 hours) at 07/26/2020 0819 Last data filed at 07/26/2020 0300 Gross per 24 hour  Intake 686.88 ml  Output 0 ml  Net 686.88 ml   Filed Weights   07/23/20 0500 07/24/20 0500 07/25/20 0003  Weight: 76.8 kg 76.8 kg 88.1 kg     Examination:  General exam: catatonic, standing on the floor HEENT: PERRL Respiratory system:  no wheezes or crackles  Cardiovascular system: S1 & S2 heard, RRR.  Gastrointestinal system: Abdomen is nondistended, soft and nontender. Central nervous system: Alert but not oriented Extremities: No edema, no clubbing ,no cyanosis Skin: No rashes, no ulcers,no icterus         Data Reviewed: I have personally reviewed following labs and imaging studies  CBC: Recent Labs  Lab 07/22/20 1434 07/23/20 0625 07/24/20 0509 07/25/20 0353 07/26/20 0618  WBC 17.6* 16.0* 13.5* 13.6* 19.3*  HGB 17.5* 16.0* 14.8 14.0 15.4*  HCT 52.6* 49.6* 45.4 43.0 47.1*  MCV 94.3 95.0 95.4  96.0 95.5  PLT 374 322 290 275 355   Basic Metabolic Panel: Recent Labs  Lab 07/22/20 1434 07/23/20 0625 07/24/20 0509 07/25/20 0353 07/26/20 0618  NA 147* 150* 154* 154* 145  K 4.1 3.5 3.5 3.2* 3.4*  CL 115* 121* 125* 123* 113*  CO2 17* 15* 20* 21* 19*  GLUCOSE 144* 109* 95 112* 137*  BUN 58* 49* 33* 23* 31*  CREATININE 1.75* 1.00 0.84 0.81 1.15*  CALCIUM 9.7 9.3 9.0 8.8* 9.5  MG 2.4 2.2 2.1  --   --   PHOS 3.6 3.5 2.8  --   --    GFR: Estimated Creatinine Clearance: 65.7 mL/min (A) (by C-G formula based on SCr of 1.15 mg/dL (H)). Liver Function Tests: Recent Labs  Lab 07/19/20 1423 07/22/20 1434 07/24/20 0509 07/25/20 0353  AST 68* 78* 30 25  ALT 65* 74* 41 30  ALKPHOS 86 96 59 56  BILITOT 1.6* 1.8* 1.5* 1.1  PROT 9.2* 8.9* 7.0 6.7  ALBUMIN 4.7 4.4 3.5 3.4*   No results for input(s): LIPASE, AMYLASE in the last 168 hours. No results for input(s): AMMONIA in the last 168 hours. Coagulation Profile: Recent Labs  Lab 07/22/20 1822  INR 1.2   Cardiac Enzymes: No results for input(s): CKTOTAL, CKMB, CKMBINDEX, TROPONINI in the last 168 hours. BNP (last 3 results) No results for input(s): PROBNP in the last 8760 hours. HbA1C: No results for input(s): HGBA1C in the last 72  hours. CBG: Recent Labs  Lab 07/22/20 0654  GLUCAP 128*   Lipid Profile: No results for input(s): CHOL, HDL, LDLCALC, TRIG, CHOLHDL, LDLDIRECT in the last 72 hours. Thyroid Function Tests: No results for input(s): TSH, T4TOTAL, FREET4, T3FREE, THYROIDAB in the last 72 hours. Anemia Panel: No results for input(s): VITAMINB12, FOLATE, FERRITIN, TIBC, IRON, RETICCTPCT in the last 72 hours. Sepsis Labs: Recent Labs  Lab 07/22/20 1822 07/22/20 2132  PROCALCITON 2.56  --   LATICACIDVEN 1.5 1.3    Recent Results (from the past 240 hour(s))  Resp Panel by RT-PCR (Flu A&B, Covid) Nasopharyngeal Swab     Status: None   Collection Time: 07/19/20  3:52 PM   Specimen: Nasopharyngeal Swab; Nasopharyngeal(NP) swabs in vial transport medium  Result Value Ref Range Status   SARS Coronavirus 2 by RT PCR NEGATIVE NEGATIVE Final    Comment: (NOTE) SARS-CoV-2 target nucleic acids are NOT DETECTED.  The SARS-CoV-2 RNA is generally detectable in upper respiratory specimens during the acute phase of infection. The lowest concentration of SARS-CoV-2 viral copies this assay can detect is 138 copies/mL. A negative result does not preclude SARS-Cov-2 infection and should not be used as the sole basis for treatment or other patient management decisions. A negative result may occur with  improper specimen collection/handling, submission of specimen other than nasopharyngeal swab, presence of viral mutation(s) within the areas targeted by this assay, and inadequate number of viral copies(<138 copies/mL). A negative result must be combined with clinical observations, patient history, and epidemiological information. The expected result is Negative.  Fact Sheet for Patients:  EntrepreneurPulse.com.au  Fact Sheet for Healthcare Providers:  IncredibleEmployment.be  This test is no t yet approved or cleared by the [place] FDA and  has been authorized for  detection and/or diagnosis of SARS-CoV-2 by FDA under an Emergency Use Authorization (EUA). This EUA will remain  in effect (meaning this test can be used) for the duration of the COVID-19 declaration under Section 564(b)(1) of the Act, 21 U.S.C.section 360bbb-3(b)(1), unless the  authorization is terminated  or revoked sooner.       Influenza A by PCR NEGATIVE NEGATIVE Final   Influenza B by PCR NEGATIVE NEGATIVE Final    Comment: (NOTE) The Xpert Xpress SARS-CoV-2/FLU/RSV plus assay is intended as an aid in the diagnosis of influenza from Nasopharyngeal swab specimens and should not be used as a sole basis for treatment. Nasal washings and aspirates are unacceptable for Xpert Xpress SARS-CoV-2/FLU/RSV testing.  Fact Sheet for Patients: EntrepreneurPulse.com.au  Fact Sheet for Healthcare Providers: IncredibleEmployment.be  This test is not yet approved or cleared by the [place] FDA and has been authorized for detection and/or diagnosis of SARS-CoV-2 by FDA under an Emergency Use Authorization (EUA). This EUA will remain in effect (meaning this test can be used) for the duration of the COVID-19 declaration under Section 564(b)(1) of the Act, 21 U.S.C. section 360bbb-3(b)(1), unless the authorization is terminated or revoked.  Performed at [facility], [address] [place], [place] [address]   CULTURE, BLOOD (ROUTINE X 2) w Reflex to ID Panel     Status: None (Preliminary result)   Collection Time: 07/22/20  6:22 PM   Specimen: BLOOD  Result Value Ref Range Status   Specimen Description BLOOD BLOOD LEFT HAND  Final   Special Requests   Final    BOTTLES DRAWN AEROBIC AND ANAEROBIC Blood Culture adequate volume   Culture   Final    NO GROWTH 4 DAYS Performed at [facility], [address] [place], [place] [address]    Report Status PENDING  Incomplete  CULTURE, BLOOD (ROUTINE X 2) w Reflex to ID Panel      Status: None (Preliminary result)   Collection Time: 07/22/20  6:23 PM   Specimen: BLOOD  Result Value Ref Range Status   Specimen Description BLOOD RIGHT ANTECUBITAL  Final   Special Requests   Final    BOTTLES DRAWN AEROBIC AND ANAEROBIC Blood Culture adequate volume   Culture   Final    NO GROWTH 4 DAYS Performed at [facility], [address], [place], [place] [address]    Report Status PENDING  Incomplete  Urine Culture     Status: Abnormal   Collection Time: 07/22/20  6:30 PM   Specimen: Urine, Random  Result Value Ref Range Status   Specimen Description   Final    URINE, RANDOM Performed at [facility], [address], [place], [place] [address]    Special Requests   Final    NONE Performed at [facility], [address] [place], [place] [address]    Culture (A)  Final    20,000 COLONIES/mL LACTOBACILLUS SPECIES Standardized susceptibility testing for this organism is not available. Performed at [facility], [place] [address] [place], [place] [address]    Report Status 07/24/2020 FINAL  Final  CULTURE, BLOOD (ROUTINE X 2) w Reflex to ID Panel     Status: None (Preliminary result)   Collection Time: 07/24/20  9:19 AM   Specimen: BLOOD  Result Value Ref Range Status   Specimen Description BLOOD BLOOD LEFT HAND  Final   Special Requests   Final    BOTTLES DRAWN AEROBIC AND ANAEROBIC Blood Culture adequate volume   Culture   Final    NO GROWTH 2 DAYS Performed at [facility], [address] [place], [place] [address]    Report Status PENDING  Incomplete  CULTURE, BLOOD (ROUTINE X 2) w Reflex to ID Panel  Status: None (Preliminary result)   Collection Time: 07/24/20  9:19 AM   Specimen: BLOOD  Result Value Ref Range Status   Specimen Description BLOOD BLOOD RIGHT HAND  Final   Special Requests   Final    BOTTLES DRAWN AEROBIC AND ANAEROBIC Blood Culture adequate volume   Culture   Final    NO GROWTH 2  DAYS Performed at [facility], [address], [place], [place] [address]    Report Status PENDING  Incomplete         Radiology Studies: No results found.      Scheduled Meds: . benztropine mesylate  1 mg Intravenous BID  . enoxaparin (LOVENOX) injection  40 mg Subcutaneous QPM  . free water  300 mL Per Tube Q6H  . nicotine  21 mg Transdermal Daily  . potassium chloride  40 mEq Oral Once   Continuous Infusions: . cefTRIAXone (ROCEPHIN)  IV Stopped (07/25/20 2201)  . dextrose 100 mL/hr at 07/26/20 0435     LOS: 4 days    Time spent: 35 mins.More than 50% of that time was spent in counseling and/or coordination of care.      Shelly Coss, MD [facility] P3/13/2022, 8:19 AM

## 2020-07-26 NOTE — Progress Notes (Addendum)
   07/26/20 0829  Assess: MEWS Score  Temp 98.5 F (36.9 C)  BP (!) 210/189  Assess: MEWS Score  MEWS Temp 0  MEWS Systolic 2  MEWS Pulse 2  MEWS RR 0  MEWS LOC 0  MEWS Score 4  MEWS Score Color Red  Assess: if the MEWS score is Yellow or Red  Were vital signs taken at a resting state? Yes  Focused Assessment No change from prior assessment  Early Detection of Sepsis Score *See Row Information* Low  MEWS guidelines implemented *See Row Information* Yes  Treat  MEWS Interventions Administered prn meds/treatments;Escalated (See documentation below)  Pain Scale PAINAD  Pain Score 0  Breathing 0  Negative Vocalization 0  Facial Expression 0  Body Language 0  Consolability 0  PAINAD Score 0  Take Vital Signs  Increase Vital Sign Frequency  Red: Q 1hr X 4 then Q 4hr X 4, if remains red, continue Q 4hrs  Escalate  MEWS: Escalate Red: discuss with charge nurse/RN and provider, consider discussing with RRT  Notify: Charge Nurse/RN  Name of Charge Nurse/RN Notified [place], RN  Date Charge Nurse/RN Notified 07/26/20  Time Charge Nurse/RN Notified 0848  Notify: Provider  Provider Name/Title Adhikari, MD  Date Provider Notified 07/26/20  Time Provider Notified 0825  Notification Type Page  Notification Reason Change in status  Provider response En route  Date of Provider Response 07/26/20  Time of Provider Response 0830  Notify: Rapid Response  Name of Rapid Response RN Notified Katie, ICU Charge RN  Date Rapid Response Notified 07/26/20  Time Rapid Response Notified 0825  Document  Patient Outcome Stabilized after interventions  Progress note created (see row info) Yes   Sitter called RN to room, pt's BP elevated, upon assessment, pt standing, leaning to the right, diaphoretic, skin warm to touch, not following commands. Charge RN, Colletta Maryland, called to the room. ICU charge RN and MD notified of red MEWS, both at bedside, see new orders. Pt back to bed, sitter at  bedside/will continue to monitor.   UPDATE 0930: BP improved with meds/will continue to monitor.

## 2020-07-27 ENCOUNTER — Inpatient Hospital Stay: Payer: Medicaid Other | Admitting: Anesthesiology

## 2020-07-27 ENCOUNTER — Encounter: Payer: Self-pay | Admitting: Internal Medicine

## 2020-07-27 ENCOUNTER — Other Ambulatory Visit: Payer: Self-pay | Admitting: Psychiatry

## 2020-07-27 LAB — CBC
HCT: 40.7 % (ref 36.0–46.0)
Hemoglobin: 13.3 g/dL (ref 12.0–15.0)
MCH: 31.4 pg (ref 26.0–34.0)
MCHC: 32.7 g/dL (ref 30.0–36.0)
MCV: 96 fL (ref 80.0–100.0)
Platelets: 210 10*3/uL (ref 150–400)
RBC: 4.24 MIL/uL (ref 3.87–5.11)
RDW: 12.8 % (ref 11.5–15.5)
WBC: 12.4 10*3/uL — ABNORMAL HIGH (ref 4.0–10.5)
nRBC: 0 % (ref 0.0–0.2)

## 2020-07-27 LAB — CULTURE, BLOOD (ROUTINE X 2)
Culture: NO GROWTH
Culture: NO GROWTH
Special Requests: ADEQUATE
Special Requests: ADEQUATE

## 2020-07-27 LAB — BASIC METABOLIC PANEL
Anion gap: 6 (ref 5–15)
BUN: 15 mg/dL (ref 6–20)
CO2: 26 mmol/L (ref 22–32)
Calcium: 8.4 mg/dL — ABNORMAL LOW (ref 8.9–10.3)
Chloride: 112 mmol/L — ABNORMAL HIGH (ref 98–111)
Creatinine, Ser: 0.58 mg/dL (ref 0.44–1.00)
GFR, Estimated: >60 mL/min (ref >60)
Glucose, Bld: 105 mg/dL — ABNORMAL HIGH (ref 70–99)
Potassium: 3 mmol/L — ABNORMAL LOW (ref 3.5–5.1)
Sodium: 144 mmol/L (ref 135–145)

## 2020-07-27 MED ORDER — POTASSIUM CHLORIDE 10 MEQ/100ML IV SOLN
10.0000 meq | INTRAVENOUS | Status: AC
  Administered 2020-07-27 (×3): 10 meq via INTRAVENOUS
  Filled 2020-07-27: qty 100

## 2020-07-27 MED ORDER — SODIUM CHLORIDE 0.9 % IV SOLN
500.0000 mL | Freq: Once | INTRAVENOUS | Status: AC
  Administered 2020-07-27: 500 mL via INTRAVENOUS

## 2020-07-27 MED ORDER — SUCCINYLCHOLINE CHLORIDE 20 MG/ML IJ SOLN
INTRAMUSCULAR | Status: DC | PRN
  Administered 2020-07-27: 100 mg via INTRAVENOUS

## 2020-07-27 MED ORDER — METHOHEXITAL SODIUM 0.5 G IJ SOLR
INTRAMUSCULAR | Status: AC
  Filled 2020-07-27: qty 500

## 2020-07-27 MED ORDER — SODIUM CHLORIDE 0.9 % IV SOLN
INTRAVENOUS | Status: DC | PRN
  Administered 2020-08-03: 500 mL via INTRAVENOUS

## 2020-07-27 MED ORDER — SUCCINYLCHOLINE CHLORIDE 200 MG/10ML IV SOSY
PREFILLED_SYRINGE | INTRAVENOUS | Status: AC
  Filled 2020-07-27: qty 10

## 2020-07-27 MED ORDER — POLYETHYLENE GLYCOL 3350 17 G PO PACK
17.0000 g | PACK | Freq: Two times a day (BID) | ORAL | Status: DC
  Administered 2020-07-29 – 2020-08-05 (×13): 17 g via ORAL
  Filled 2020-07-27 (×15): qty 1

## 2020-07-27 MED ORDER — SODIUM CHLORIDE 0.9 % IV SOLN: INTRAVENOUS | Status: DC | PRN

## 2020-07-27 MED ORDER — GLYCOPYRROLATE 0.2 MG/ML IJ SOLN
0.1000 mg | Freq: Once | INTRAMUSCULAR | Status: DC
  Filled 2020-07-27: qty 0.5

## 2020-07-27 MED ORDER — SENNA 8.6 MG PO TABS
1.0000 | ORAL_TABLET | Freq: Every day | ORAL | Status: DC
  Administered 2020-07-29 – 2020-08-05 (×6): 8.6 mg via ORAL
  Filled 2020-07-27 (×7): qty 1

## 2020-07-27 MED ORDER — POTASSIUM CHLORIDE 10 MEQ/100ML IV SOLN
10.0000 meq | INTRAVENOUS | Status: AC
  Administered 2020-07-27 (×4): 10 meq via INTRAVENOUS
  Filled 2020-07-27 (×6): qty 100

## 2020-07-27 MED ORDER — METHOHEXITAL SODIUM 100 MG/10ML IV SOSY
PREFILLED_SYRINGE | INTRAVENOUS | Status: DC | PRN
  Administered 2020-07-27: 80 mg via INTRAVENOUS
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
## 2020-07-27 NOTE — Progress Notes (Signed)
Yellow MEWS-patient off the floor in Specials for ECT when MEWS turned Yellow. CN Linard Millers spoke with Specials RN, patient stable at this time. Vitals will be rechecked when patient returns to unit.

## 2020-07-27 NOTE — Procedures (Signed)
ECT SERVICES Physician's Interval Evaluation & Treatment Note  Patient Identification: Isabella [name] MRN:  [id number] Date of Evaluation:  07/27/2020 TX #: [id number]  MADRS:   MMSE:   P.E. Findings:  No change to physical exam.  Very withdrawn and flattened unresponsive  Psychiatric Interval Note:  Flat unresponsive.  Vitals stable.  Subjective:  [name] seen for evaluation for Electroconvulsive Therapy. No verbalizing of any complaint  Treatment Summary:   []   Right Unilateral             [x]  Bilateral   % Energy : 1.0 ms 60%   Impedance: 2080 ohms  Seizure Energy Index: 28,696 V squared  Postictal Suppression Index: 94%  Seizure Concordance Index: 99%  Medications  Pre Shock: Robinul 0.1 mg Toradol 30 mg Brevital 80 mg succinylcholine 100 mg  Post Shock:    Seizure Duration: 27 seconds EMG 45 seconds EEG   Comments: Tolerated well.  Next treatment scheduled for Wednesday  Lungs:  [x]   Clear to auscultation               []  Other:   Heart:    [x]   Regular rhythm             []  irregular rhythm    [x]   Previous H&P reviewed, patient examined and there are NO CHANGES                 []   Previous H&P reviewed, patient examined and there are changes noted.   Isabella Berthold, MD 3/14/20226:08 PM

## 2020-07-27 NOTE — Progress Notes (Signed)
Vital signs within normal limits when patient returned to unit post ECT. Patient resting comfortably in bed and able to answer yes/no questions. Sitter in room.

## 2020-07-27 NOTE — Transfer of Care (Signed)
Immediate Anesthesia Transfer of Care Note  Patient: Isabella [name]  Procedure(s) Performed: ECT TX  Patient Location: PACU  Anesthesia Type:General  Level of Consciousness: sedated  Airway & Oxygen Therapy: Patient Spontanous Breathing and Patient connected to face mask oxygen  Post-op Assessment: Report given to RN and Post -op Vital signs reviewed and stable  Post vital signs: Reviewed and stable  Last Vitals:  Vitals Value Taken Time  BP    Temp    Pulse    Resp    SpO2      Last Pain:  Vitals:   07/27/20 1047  TempSrc: Oral  PainSc: 0-No pain         Complications: No complications documented.

## 2020-07-27 NOTE — Anesthesia Postprocedure Evaluation (Signed)
Anesthesia Post Note  Patient: Isabella [name]  Procedure(s) Performed: ECT TX  Patient location during evaluation: PACU Anesthesia Type: General Level of consciousness: awake and alert Pain management: pain level controlled Vital Signs Assessment: post-procedure vital signs reviewed and stable Respiratory status: spontaneous breathing, nonlabored ventilation, respiratory function stable and patient connected to nasal cannula oxygen Cardiovascular status: blood pressure returned to baseline and stable Postop Assessment: no apparent nausea or vomiting Anesthetic complications: no   No complications documented.   Last Vitals:  Vitals:   07/27/20 1324 07/27/20 1336  BP: 119/76   Pulse: 88 79  Resp: (!) 21 (!) 26  Temp: 36.9 C   SpO2: 98% 94%    Last Pain:  Vitals:   07/27/20 1047  TempSrc: Oral  PainSc: 0-No pain                 Precious Haws Elberta Lachapelle
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
## 2020-07-27 NOTE — Progress Notes (Signed)
PROGRESS NOTE    Isabella [name]  DTO:[id number] DOB: [birth year] DOA: 07/22/2020 PCP: Patient, No Pcp Per   Chief Complain: Fever  Brief Narrative: Patient is a 45 year old female with history of hypertension, schizophrenia, bipolar disorder, drug abuse, migraine headache who presents with fever from behavioral health unit.  She was eventually admitted there for the management of schizophrenia after she had 2 to 3-weeks episode of catatonia.  She is status post ECT.  Patient was found to be febrile at behavioral health unit and was admitted under [facility] service.  Lab work showed leukocytosis, mild elevated LFTs, AKI with creatinine of 1.7. Patient was admitted for the suspicion of sepsis.  Sepsis physiology has improved.  She can be transferred to inpatient psychiatric facility whenever appropriate.  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  Received 2 L of normal saline bolus in the emergency department.  Lactate was normal.  She had elevated leukocytes.  She was treated with vancomycin oxygen, Flagyl, and these antibiotics have been discontinued and she has been on ceftriaxone for now,plan for 5 days course.  Urinalysis showed bacteriuria but showed less than 20 K lactobacillus species.  Blood cultures have been negative so far. Patient is afebrile today. CT stone study did not show any urinary obstruction, stones or pyelonephritis.  No evidence of cholecystitis as per ultrasound of the gallbladder.leucocytosis improving  XIP:[id number]  with IV fluids  Hypokalemia: Being supplemented and monitored.  Catatonic  schizophrenia/social anxiety disorder/depression: Was admitted under psychiatry service.  Status post ECT.  She was on Prozac, olanzapine at home, trazodone.  Psychiatry closely following.  Continue one-to-one observation.  On Cogentin 1 mg twice a day for rigidity. Undergoing ECT today Continue as needed Haldol/Ativan for agitation Patient was refusing  to take any oral medications.  Elevated LFTs: Resolved.  Ultrasound of the gallbladder showed hepatic steatosis  Hypernatremia:resolved  Severe hypertension: Likely from catatonia.  Now BP is stabe.Started on clonidine patch, continue as needed medication for severe hypertension.  Tobacco abuse: Continue nicotine patch  Fibroids: As per CT scan.  CT imaging showed a large uterus with multiple fibroids.  We recommend to follow-up with GYN as an outpatient.            DVT prophylaxis:Lovenox Code Status: Full Family Communication: None at bed side Status is: Inpatient  Remains inpatient appropriate because:Inpatient level of care appropriate due to severity of illness   Dispo: The patient is from: Behavioral health              Anticipated d/c is to: Behavioral health              Patient currently is medically stable for discharge from medicine   Difficult to place patient No     Consultants: Psychiatry  Procedures:ECT  Antimicrobials:  Anti-infectives (From admission, onward)   Start     Dose/Rate Route Frequency Ordered Stop   07/26/20 2200  cefTRIAXone (ROCEPHIN) 1 g in sodium chloride 0.9 % 100 mL IVPB        1 g 200 mL/hr over 30 Minutes Intravenous Every 24 hours 07/26/20 1158 07/29/20  2159   07/25/20 2200  cefTRIAXone (ROCEPHIN) 2 g in sodium chloride 0.9 % 100 mL IVPB  Status:  Discontinued        2 g 200 mL/hr over 30 Minutes Intravenous Every 24 hours 07/25/20 1238 07/26/20 1158   07/24/20 1900  cefTRIAXone (ROCEPHIN) 1 g in sodium chloride 0.9 % 100 mL IVPB  Status:  Discontinued        1 g 200 mL/hr  over 30 Minutes Intravenous Every 24 hours 07/24/20 1431 07/25/20 1238   07/23/20 2200  vancomycin (VANCOREADY) IVPB 1500 mg/300 mL  Status:  Discontinued        1,500 mg 150 mL/hr over 120 Minutes Intravenous Every 24 hours 07/23/20 1432 07/24/20 1045   07/23/20 0900  ceFEPIme (MAXIPIME) 2 g in sodium chloride 0.9 % 100 mL IVPB  Status:  Discontinued        2 g 200 mL/hr over 30 Minutes Intravenous Every 8 hours 07/23/20 0800 07/24/20 1431   07/23/20 0804  vancomycin variable dose per unstable renal function (pharmacist dosing)  Status:  Discontinued         Does not apply See admin instructions 07/23/20 0804 07/23/20 1432   07/22/20 2000  vancomycin (VANCOREADY) IVPB 1750 mg/350 mL        1,750 mg 175 mL/hr over 120 Minutes Intravenous  Once 07/22/20 1800 07/22/20 2258   07/22/20 1900  metroNIDAZOLE (FLAGYL) IVPB 500 mg  Status:  Discontinued        500 mg 100 mL/hr over 60 Minutes Intravenous Every 8 hours 07/22/20 1758 07/24/20 1045   07/22/20 1845  metroNIDAZOLE (FLAGYL) IVPB 500 mg  Status:  Discontinued        500 mg 100 mL/hr over 60 Minutes Intravenous Every 8 hours 07/22/20 1754 07/22/20 1758   07/22/20 1845  ceFEPIme (MAXIPIME) 2 g in sodium chloride 0.9 % 100 mL IVPB  Status:  Discontinued        2 g 200 mL/hr over 30 Minutes Intravenous Every 12 hours 07/22/20 1758 07/23/20 0800      Subjective: Patient seen and examined the bedside this morning.  She was in deep sleep but opened her eyes on calling her name.  Hemodynamically stable.  Objective: Vitals:   07/27/20 1256 07/27/20 1315 07/27/20 1324 07/27/20 1336  BP:  119/67 119/76   Pulse: 99 82 88 79  Resp: (!) 23 (!) 24 (!) 21 (!) 26  Temp: 98.5 F (36.9 C)  98.5 F (36.9 C)   TempSrc:      SpO2: 97% 93% 98% 94%  Weight:      Height:        Intake/Output Summary (Last 24 hours) at 07/27/2020 1405 Last data filed at 07/27/2020 1249 Gross per 24 hour  Intake 2586.66 ml  Output --  Net 2586.66 ml   Filed  Weights   07/25/20 0003 07/27/20 0100 07/27/20 1047  Weight: 88.1 kg 89.6 kg 89.6 kg    Examination:  General exam: in sleep Respiratory system:  no wheezes or crackles  Cardiovascular system: S1 & S2 heard, RRR.  Gastrointestinal system: Abdomen is nondistended, soft and nontender. Central nervous system: Not Alert and oriented Extremities: No edema, no clubbing ,no cyanosis Skin: No rashes, no ulcers,no icterus   Data Reviewed: I have personally reviewed following labs and imaging studies  CBC: Recent Labs  Lab 07/23/20 0625 07/24/20 0509 07/25/20 0353 07/26/20 0618 07/27/20 0413  WBC 16.0* 13.5* 13.6* 19.3* 12.4*  HGB 16.0* 14.8 14.0  15.4* 13.3  HCT 49.6* 45.4 43.0 47.1* 40.7  MCV 95.0 95.4 96.0 95.5 96.0  PLT 322 290 275 283 010   Basic Metabolic Panel: Recent Labs  Lab 07/22/20 1434 07/23/20 0625 07/24/20 0509 07/25/20 0353 07/26/20 0618 07/27/20 0413  NA 147* 150* 154* 154* 145 144  K 4.1 3.5 3.5 3.2* 3.4* 3.0*  CL 115* 121* 125* 123* 113* 112*  CO2 17* 15* 20* 21* 19* 26  GLUCOSE 144* 109* 95 112* 137* 105*  BUN 58* 49* 33* 23* 31* 15  CREATININE 1.75* 1.00 0.84 0.81 1.15* 0.58  CALCIUM 9.7 9.3 9.0 8.8* 9.5 8.4*  MG 2.4 2.2 2.1  --   --   --   PHOS 3.6 3.5 2.8  --   --   --    GFR: Estimated Creatinine Clearance: 95.3 mL/min (by C-G formula based on SCr of 0.58 mg/dL). Liver Function Tests: Recent Labs  Lab 07/22/20 1434 07/24/20 0509 07/25/20 0353  AST 78* 30 25  ALT 74* 41 30  ALKPHOS 96 59 56  BILITOT 1.8* 1.5* 1.1  PROT 8.9* 7.0 6.7  ALBUMIN 4.4 3.5 3.4*   No results for input(s): LIPASE, AMYLASE in the last 168 hours. No results for input(s): AMMONIA in the last 168 hours. Coagulation Profile: Recent Labs  Lab 07/22/20 1822  INR 1.2   Cardiac Enzymes: No results for input(s): CKTOTAL, CKMB, CKMBINDEX, TROPONINI in the last 168 hours. BNP (last 3 results) No results for input(s): PROBNP in the last 8760 hours. HbA1C: No results  for input(s): HGBA1C in the last 72 hours. CBG: Recent Labs  Lab 07/22/20 0654  GLUCAP 128*   Lipid Profile: No results for input(s): CHOL, HDL, LDLCALC, TRIG, CHOLHDL, LDLDIRECT in the last 72 hours. Thyroid Function Tests: No results for input(s): TSH, T4TOTAL, FREET4, T3FREE, THYROIDAB in the last 72 hours. Anemia Panel: No results for input(s): VITAMINB12, FOLATE, FERRITIN, TIBC, IRON, RETICCTPCT in the last 72 hours. Sepsis Labs: Recent Labs  Lab 07/22/20 1822 07/22/20 2132  PROCALCITON 2.56  --   LATICACIDVEN 1.5 1.3    Recent Results (from the past 240 hour(s))  Resp Panel by RT-PCR (Flu A&B, Covid) Nasopharyngeal Swab     Status: None   Collection Time: 07/19/20  3:52 PM   Specimen: Nasopharyngeal Swab; Nasopharyngeal(NP) swabs in vial transport medium  Result Value Ref Range Status   SARS Coronavirus 2 by RT PCR NEGATIVE NEGATIVE Final    Comment: (NOTE) SARS-CoV-2 target nucleic acids are NOT DETECTED.  The SARS-CoV-2 RNA is generally detectable in upper respiratory specimens during the acute phase of infection. The lowest concentration of SARS-CoV-2 viral copies this assay can detect is 138 copies/mL. A negative result does not preclude SARS-Cov-2 infection and should not be used as the sole basis for treatment or other patient management decisions. A negative result may occur with  improper specimen collection/handling, submission of specimen other than nasopharyngeal swab, presence of viral mutation(s) within the areas targeted by this assay, and inadequate number of viral copies(<138 copies/mL). A negative result must be combined with clinical observations, patient history, and epidemiological information. The expected result is Negative.  Fact Sheet for Patients:  EntrepreneurPulse.com.au  Fact Sheet for Healthcare Providers:  IncredibleEmployment.be  This test is no t yet approved or cleared by the [place] FDA  and  has been authorized for detection and/or diagnosis of SARS-CoV-2 by FDA under an Emergency Use Authorization (EUA). This EUA will remain  in effect (meaning this test can  be used) for the duration of the COVID-19 declaration under Section 564(b)(1) of the Act, 21 U.S.C.section 360bbb-3(b)(1), unless the authorization is terminated  or revoked sooner.       Influenza A by PCR NEGATIVE NEGATIVE Final   Influenza B by PCR NEGATIVE NEGATIVE Final    Comment: (NOTE) The Xpert Xpress SARS-CoV-2/FLU/RSV plus assay is intended as an aid in the diagnosis of influenza from Nasopharyngeal swab specimens and should not be used as a sole basis for treatment. Nasal washings and aspirates are unacceptable for Xpert Xpress SARS-CoV-2/FLU/RSV testing.  Fact Sheet for Patients: EntrepreneurPulse.com.au  Fact Sheet for Healthcare Providers: IncredibleEmployment.be  This test is not yet approved or cleared by the [place] FDA and has been authorized for detection and/or diagnosis of SARS-CoV-2 by FDA under an Emergency Use Authorization (EUA). This EUA will remain in effect (meaning this test can be used) for the duration of the COVID-19 declaration under Section 564(b)(1) of the Act, 21 U.S.C. section 360bbb-3(b)(1), unless the authorization is terminated or revoked.  Performed at [facility], [address] [place], [place] [address]   CULTURE, BLOOD (ROUTINE X 2) w Reflex to ID Panel     Status: None   Collection Time: 07/22/20  6:22 PM   Specimen: BLOOD  Result Value Ref Range Status   Specimen Description BLOOD BLOOD LEFT HAND  Final   Special Requests   Final    BOTTLES DRAWN AEROBIC AND ANAEROBIC Blood Culture adequate volume   Culture   Final    NO GROWTH 5 DAYS Performed at [facility], [address] [place], [place] [address]    Report Status 07/27/2020 FINAL  Final  CULTURE, BLOOD (ROUTINE X 2) w Reflex to ID  Panel     Status: None   Collection Time: 07/22/20  6:23 PM   Specimen: BLOOD  Result Value Ref Range Status   Specimen Description BLOOD RIGHT ANTECUBITAL  Final   Special Requests   Final    BOTTLES DRAWN AEROBIC AND ANAEROBIC Blood Culture adequate volume   Culture   Final    NO GROWTH 5 DAYS Performed at [facility], [address], [place], [place] [address]    Report Status 07/27/2020 FINAL  Final  Urine Culture     Status: Abnormal   Collection Time: 07/22/20  6:30 PM   Specimen: Urine, Random  Result Value Ref Range Status   Specimen Description   Final    URINE, RANDOM Performed at [facility], [address], [place], [place] [address]    Special Requests   Final    NONE Performed at [facility], [address] [place], [place] [address]    Culture (A)  Final    20,000 COLONIES/mL LACTOBACILLUS SPECIES Standardized susceptibility testing for this organism is not available. Performed at [facility], [place] [address], [place], [place] [address]    Report Status 07/24/2020 FINAL  Final  CULTURE, BLOOD (ROUTINE X 2) w Reflex to ID Panel     Status: None (Preliminary result)   Collection Time: 07/24/20  9:19 AM   Specimen: BLOOD  Result Value Ref Range Status   Specimen Description BLOOD BLOOD LEFT HAND  Final   Special Requests   Final    BOTTLES DRAWN AEROBIC AND ANAEROBIC Blood Culture adequate volume   Culture   Final    NO GROWTH 3 DAYS Performed at [facility], [address], [place], [place] [address]  Report Status PENDING  Incomplete  CULTURE, BLOOD (ROUTINE X 2) w Reflex to ID Panel     Status: None (Preliminary result)   Collection Time: 07/24/20  9:19 AM   Specimen: BLOOD  Result Value Ref Range Status   Specimen Description BLOOD BLOOD RIGHT HAND  Final   Special Requests   Final    BOTTLES DRAWN AEROBIC AND ANAEROBIC Blood Culture adequate volume   Culture   Final    NO GROWTH 3 DAYS Performed  at [facility], [address], [place], [place] [address]    Report Status PENDING  Incomplete         Radiology Studies: No results found.      Scheduled Meds: . cloNIDine  0.2 mg Transdermal Weekly  . enoxaparin (LOVENOX) injection  40 mg Subcutaneous QPM  . glycopyrrolate  0.1 mg Intravenous Once  . nicotine  21 mg Transdermal Daily   Continuous Infusions: . sodium chloride    . sodium chloride    . cefTRIAXone (ROCEPHIN)  IV Stopped (07/26/20 2147)  . dextrose 50 mL/hr at 07/27/20 1402  . potassium chloride 10 mEq (07/27/20 1357)     LOS: 5 days    Time spent: 25 mins.More than 50% of that time was spent in counseling and/or coordination of care.      Shelly Coss, MD [facility] P3/14/2022, 2:05 PM

## 2020-07-28 LAB — BASIC METABOLIC PANEL
Anion gap: 7 (ref 5–15)
BUN: 11 mg/dL (ref 6–20)
CO2: 25 mmol/L (ref 22–32)
Calcium: 8.6 mg/dL — ABNORMAL LOW (ref 8.9–10.3)
Chloride: 111 mmol/L (ref 98–111)
Creatinine, Ser: 0.55 mg/dL (ref 0.44–1.00)
GFR, Estimated: >60 mL/min (ref >60)
Glucose, Bld: 95 mg/dL (ref 70–99)
Potassium: 3.2 mmol/L — ABNORMAL LOW (ref 3.5–5.1)
Sodium: 143 mmol/L (ref 135–145)

## 2020-07-28 LAB — GLUCOSE, CAPILLARY
Glucose-Capillary: 87 mg/dL (ref 70–99)
Glucose-Capillary: 90 mg/dL (ref 70–99)

## 2020-07-28 LAB — BETA HCG QUANT (REF LAB): hCG Quant: <1 m[IU]/mL

## 2020-07-28 MED ORDER — POTASSIUM CHLORIDE CRYS ER 20 MEQ PO TBCR
40.0000 meq | EXTENDED_RELEASE_TABLET | Freq: Once | ORAL | Status: AC
  Administered 2020-07-28: 40 meq via ORAL
  Filled 2020-07-28: qty 2
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
## 2020-07-28 NOTE — Plan of Care (Signed)
Spanish interpreter utilized for patient care/education. Patient remains non-verbal, will nod at times or shake head appropriately, continue w POC

## 2020-07-28 NOTE — Plan of Care (Signed)
Shift Summary: Pt mostly nonverbal overnight. Gave short shakes/nods to questions, otherwise flat and withdrawn. Attempted to have pt get OOB to void since no void for appx 8 hours, unable to follow commands and no intent to move. I/O cath x1 for 975ml on bladder scan. D5W infusing per orders, completed 2 runs of K+ overnight. 1:1 sitter continues. Rounding performed, fall/safety precautions maintained.  Problem: Education: Goal: Knowledge of General Education information will improve Description: Including pain rating scale, medication(s)/side effects and non-pharmacologic comfort measures Outcome: Progressing   Problem: Health Behavior/Discharge Planning: Goal: Ability to manage health-related needs will improve Outcome: Progressing   Problem: Clinical Measurements: Goal: Ability to maintain clinical measurements within normal limits will improve Outcome: Progressing Goal: Will remain free from infection Outcome: Progressing Goal: Diagnostic test results will improve Outcome: Progressing Goal: Respiratory complications will improve Outcome: Progressing Goal: Cardiovascular complication will be avoided Outcome: Progressing   Problem: Activity: Goal: Risk for activity intolerance will decrease Outcome: Progressing   Problem: Nutrition: Goal: Adequate nutrition will be maintained Outcome: Progressing   Problem: Coping: Goal: Level of anxiety will decrease Outcome: Progressing   Problem: Elimination: Goal: Will not experience complications related to bowel motility Outcome: Progressing Goal: Will not experience complications related to urinary retention Outcome: Progressing   Problem: Pain Managment: Goal: General experience of comfort will improve Outcome: Progressing   Problem: Safety: Goal: Ability to remain free from injury will improve Outcome: Progressing   Problem: Skin Integrity: Goal: Risk for impaired skin integrity will decrease Outcome: Progressing

## 2020-07-28 NOTE — Progress Notes (Signed)
CN, Caryl Pina confirmed with Dr. Weber Cooks that sitter was not indicated. Orma Flaming, RN

## 2020-07-28 NOTE — Progress Notes (Signed)
PROGRESS NOTE    Isabella [name]  MVE:[id number] DOB: May 17, 1975 DOA: 07/22/2020 PCP: Patient, No Pcp Per   Chief Complain: Fever  Brief Narrative: Patient is a 45 year old female with history of hypertension, schizophrenia, bipolar disorder, drug abuse, migraine headache who presents with fever from behavioral health unit.  She was admitted there for the management of schizophrenia after she had 2 to 3-weeks episode of catatonia.  She underwent  ECT at behavioral health.  Patient was found to be febrile there   and was admitted under [facility] service.  Lab work showed leukocytosis, mild elevated LFTs, AKI with creatinine of 1.7. Patient was admitted for the suspicion of sepsis and was started on Abx .  Sepsis physiology has resolved,she has completed Abx course.  She is getting intermittent ECTs .she can be transferred to inpatient psychiatric facility whenever appropriate.  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  Received 2 L of normal saline bolus in the emergency department.  Lactate was normal.  She had elevated leukocytes. CT stone study did not show any urinary obstruction, stones or pyelonephritis.No evidence of cholecystitis as per ultrasound of the gallbladder. She was treated with vancomycin oxygen, Flagyl, ceftriaxone and these antibiotics have been discontinued she completed  course.  Urinalysis showed bacteriuria but showed less than 20 K lactobacillus species.  Blood cultures have been negative so far. Patient is afebrile today.    GEZ:MOQHUTML  with IV fluids  Hypokalemia: Being  supplemented and monitored.  Catatonic schizophrenia/social anxiety disorder/depression: Was admitted under psychiatry service.  Status post ECT.  She was on Prozac, olanzapine at home.  Psychiatry closely following.  On Cogentin 1 mg twice a day for rigidity. Undergoing ECT Continue as needed Haldol/Ativan for severe agitation Patient frequently refusing   to take any oral medications. Next ECT is planned for Wednesday.  Elevated LFTs: Resolved.  Ultrasound of the gallbladder showed hepatic steatosis  Hypernatremia:resolved  Severe hypertension: Likely from catatonia.  Now BP is stabe.Started on clonidine patch because patient has poor oral intake, continue as needed medication for severe hypertension.  Tobacco abuse: Continue nicotine patch  Fibroids:  CT imaging showed a large uterus with multiple fibroids.  We recommend to follow-up with GYN as an outpatient.            DVT prophylaxis:Lovenox Code Status: Full Family Communication: None at bed side Status is: Inpatient  Remains inpatient appropriate because:Inpatient level of care appropriate due to severity of illness   Dispo: The patient is from: Behavioral health              Anticipated d/c is to: Behavioral health              Patient currently is medically stable for discharge from medicine   Difficult to place patient No     Consultants: Psychiatry  Procedures:ECT  Antimicrobials:  Anti-infectives (From admission, onward)   Start     Dose/Rate Route Frequency Ordered Stop   07/26/20 2200  cefTRIAXone (ROCEPHIN) 1 g in sodium chloride 0.9 % 100 mL IVPB        1 g  200 mL/hr over 30 Minutes Intravenous Every 24 hours 07/26/20 1158 07/29/20 2159   07/25/20 2200  cefTRIAXone (ROCEPHIN) 2 g in sodium chloride 0.9 % 100 mL IVPB  Status:  Discontinued        2 g 200 mL/hr over 30 Minutes Intravenous Every 24 hours 07/25/20 1238 07/26/20 1158   07/24/20 1900  cefTRIAXone (ROCEPHIN) 1 g in sodium chloride 0.9  % 100 mL IVPB  Status:  Discontinued        1 g 200 mL/hr over 30 Minutes Intravenous Every 24 hours 07/24/20 1431 07/25/20 1238   07/23/20 2200  vancomycin (VANCOREADY) IVPB 1500 mg/300 mL  Status:  Discontinued        1,500 mg 150 mL/hr over 120 Minutes Intravenous Every 24 hours 07/23/20 1432 07/24/20 1045   07/23/20 0900  ceFEPIme (MAXIPIME) 2 g in sodium chloride 0.9 % 100 mL IVPB  Status:  Discontinued        2 g 200 mL/hr over 30 Minutes Intravenous Every 8 hours 07/23/20 0800 07/24/20 1431   07/23/20 0804  vancomycin variable dose per unstable renal function (pharmacist dosing)  Status:  Discontinued         Does not apply See admin instructions 07/23/20 0804 07/23/20 1432   07/22/20 2000  vancomycin (VANCOREADY) IVPB 1750 mg/350 mL        1,750 mg 175 mL/hr over 120 Minutes Intravenous  Once 07/22/20 1800 07/22/20 2258   07/22/20 1900  metroNIDAZOLE (FLAGYL) IVPB 500 mg  Status:  Discontinued        500 mg 100 mL/hr over 60 Minutes Intravenous Every 8 hours 07/22/20 1758 07/24/20 1045   07/22/20 1845  metroNIDAZOLE (FLAGYL) IVPB 500 mg  Status:  Discontinued        500 mg 100 mL/hr over 60 Minutes Intravenous Every 8 hours 07/22/20 1754 07/22/20 1758   07/22/20 1845  ceFEPIme (MAXIPIME) 2 g in sodium chloride 0.9 % 100 mL IVPB  Status:  Discontinued        2 g 200 mL/hr over 30 Minutes Intravenous Every 12 hours 07/22/20 1758 07/23/20 0800      Subjective: Patient seen and examined at the bedside this morning.  She was lying on the bed, not agitated like yesterday.  She has flat affect, does not participate in conversation that much.  Overall comfortable.  Blood pressure stable  Objective: Vitals:   07/27/20 1529 07/27/20 1946 07/28/20 0002 07/28/20 0405  BP: 124/79 135/79 128/71 108/70  Pulse: 88 72 82 77  Resp: 14 (!) _0 Temp: 97.6 F (36.4 C) 98 F (36.7 C) 98.9 F (37.2 C) 98.4 F (36.9 C)  TempSrc: Oral Oral Axillary Oral  SpO2: 100% 99% 98% 99%  Weight:       Height:        Intake/Output Summary (Last 24 hours) at 07/28/2020 0739 Last data filed at 07/28/2020 0500 Gross per 24 hour  Intake 1837.7 ml  Output 950 ml  Net 887.7 ml   Filed Weights   07/25/20 0003 07/27/20 0100 07/27/20 1047  Weight: 88.1 kg 89.6 kg 89.6 kg    Examination:  General exam: Overall comfortable, not in distress, withdrawn/flat affect,obese Respiratory system:  no wheezes or crackles  Cardiovascular system: S1 & S2 heard, RRR.  Gastrointestinal system: Abdomen is nondistended, soft and nontender. Central nervous system: Alert and oriented Extremities: No edema, no clubbing ,no cyanosis Skin: No rashes, no ulcers,no icterus    Data Reviewed: I have  personally reviewed following labs and imaging studies  CBC: Recent Labs  Lab 07/23/20 0625 07/24/20 0509 07/25/20 0353 07/26/20 0618 07/27/20 0413  WBC 16.0* 13.5* 13.6* 19.3* 12.4*  HGB 16.0* 14.8 14.0 15.4* 13.3  HCT 49.6* 45.4 43.0 47.1* 40.7  MCV 95.0 95.4 96.0 95.5 96.0  PLT 322 290 275 283 403   Basic Metabolic Panel: Recent Labs  Lab 07/22/20 1434 07/23/20 0625 07/24/20 0509 07/25/20 0353 07/26/20 0618 07/27/20 0413 07/28/20 0536  NA 147* 150* 154* 154* 145 144 143  K 4.1 3.5 3.5 3.2* 3.4* 3.0* 3.2*  CL 115* 121* 125* 123* 113* 112* 111  CO2 17* 15* 20* 21* 19* 26 25  GLUCOSE 144* 109* 95 112* 137* 105* 95  BUN 58* 49* 33* 23* 31* 15 11  CREATININE 1.75* 1.00 0.84 0.81 1.15* 0.58 0.55  CALCIUM 9.7 9.3 9.0 8.8* 9.5 8.4* 8.6*  MG 2.4 2.2 2.1  --   --   --   --   PHOS 3.6 3.5 2.8  --   --   --   --    GFR: Estimated Creatinine Clearance: 95.3 mL/min (by C-G formula based on SCr of 0.55 mg/dL). Liver Function Tests: Recent Labs  Lab 07/22/20 1434 07/24/20 0509 07/25/20 0353  AST 78* 30 25  ALT 74* 41 30  ALKPHOS 96 59 56  BILITOT 1.8* 1.5* 1.1  PROT 8.9* 7.0 6.7  ALBUMIN 4.4 3.5 3.4*   No results for input(s): LIPASE, AMYLASE in the last 168 hours. No results for  input(s): AMMONIA in the last 168 hours. Coagulation Profile: Recent Labs  Lab 07/22/20 1822  INR 1.2   Cardiac Enzymes: No results for input(s): CKTOTAL, CKMB, CKMBINDEX, TROPONINI in the last 168 hours. BNP (last 3 results) No results for input(s): PROBNP in the last 8760 hours. HbA1C: No results for input(s): HGBA1C in the last 72 hours. CBG: Recent Labs  Lab 07/22/20 0654 07/28/20 0431  GLUCAP 128* 87   Lipid Profile: No results for input(s): CHOL, HDL, LDLCALC, TRIG, CHOLHDL, LDLDIRECT in the last 72 hours. Thyroid Function Tests: No results for input(s): TSH, T4TOTAL, FREET4, T3FREE, THYROIDAB in the last 72 hours. Anemia Panel: No results for input(s): VITAMINB12, FOLATE, FERRITIN, TIBC, IRON, RETICCTPCT in the last 72 hours. Sepsis Labs: Recent Labs  Lab 07/22/20 1822 07/22/20 2132  PROCALCITON 2.56  --   LATICACIDVEN 1.5 1.3    Recent Results (from the past 240 hour(s))  Resp Panel by RT-PCR (Flu A&B, Covid) Nasopharyngeal Swab     Status: None   Collection Time: 07/19/20  3:52 PM   Specimen: Nasopharyngeal Swab; Nasopharyngeal(NP) swabs in vial transport medium  Result Value Ref Range Status   SARS Coronavirus 2 by RT PCR NEGATIVE NEGATIVE Final    Comment: (NOTE) SARS-CoV-2 target nucleic acids are NOT DETECTED.  The SARS-CoV-2 RNA is generally detectable in upper respiratory specimens during the acute phase of infection. The lowest concentration of SARS-CoV-2 viral copies this assay can detect is 138 copies/mL. A negative result does not preclude SARS-Cov-2 infection and should not be used as the sole basis for treatment or other patient management decisions. A negative result may occur with  improper specimen collection/handling, submission of specimen other than nasopharyngeal swab, presence of viral mutation(s) within the areas targeted by this assay, and inadequate number of viral copies(<138 copies/mL). A negative result must be combined  with clinical observations, patient history, and epidemiological information. The expected result is Negative.  Fact Sheet for Patients:  EntrepreneurPulse.com.au  Fact Sheet for Healthcare Providers:  IncredibleEmployment.be  This test is no t yet approved or cleared by the [place] FDA and  has been authorized for detection and/or diagnosis of SARS-CoV-2 by FDA under an Emergency Use Authorization (EUA). This EUA will remain  in effect (meaning this test can be used) for the duration of the COVID-19 declaration under Section 564(b)(1) of the Act, 21 U.S.C.section 360bbb-3(b)(1), unless the authorization is terminated  or revoked sooner.       Influenza A by PCR NEGATIVE NEGATIVE Final   Influenza B by PCR NEGATIVE NEGATIVE Final    Comment: (NOTE) The Xpert Xpress SARS-CoV-2/FLU/RSV plus assay is intended as an aid in the diagnosis of influenza from Nasopharyngeal swab specimens and should not be used as a sole basis for treatment. Nasal washings and aspirates are unacceptable for Xpert Xpress SARS-CoV-2/FLU/RSV testing.  Fact Sheet for Patients: EntrepreneurPulse.com.au  Fact Sheet for Healthcare Providers: IncredibleEmployment.be  This test is not yet approved or cleared by the [place] FDA and has been authorized for detection and/or diagnosis of SARS-CoV-2 by FDA under an Emergency Use Authorization (EUA). This EUA will remain in effect (meaning this test can be used) for the duration of the COVID-19 declaration under Section 564(b)(1) of the Act, 21 U.S.C. section 360bbb-3(b)(1), unless the authorization is terminated or revoked.  Performed at [facility], [address] [place], [place] [address]   CULTURE, BLOOD (ROUTINE X 2) w Reflex to ID Panel     Status: None   Collection Time: 07/22/20  6:22 PM   Specimen: BLOOD  Result Value Ref Range Status   Specimen  Description BLOOD BLOOD LEFT HAND  Final   Special Requests   Final    BOTTLES DRAWN AEROBIC AND ANAEROBIC Blood Culture adequate volume   Culture   Final    NO GROWTH 5 DAYS Performed at [facility], [address] [place], [place] [address]    Report Status 07/27/2020 FINAL  Final  CULTURE, BLOOD (ROUTINE X 2) w Reflex to ID Panel     Status: None   Collection Time: 07/22/20  6:23 PM   Specimen: BLOOD  Result Value Ref Range Status   Specimen Description BLOOD RIGHT ANTECUBITAL  Final   Special Requests   Final    BOTTLES DRAWN AEROBIC AND ANAEROBIC Blood Culture adequate volume   Culture   Final    NO GROWTH 5 DAYS Performed at [facility], [address], [place], [place] [address]    Report Status 07/27/2020 FINAL  Final  Urine Culture     Status: Abnormal   Collection Time: 07/22/20  6:30 PM   Specimen: Urine, Random  Result Value Ref Range Status   Specimen Description   Final    URINE, RANDOM Performed at [facility], [address] [place], [place] [address]    Special Requests   Final    NONE Performed at [facility], [address] [place], [place] [address]    Culture (A)  Final    20,000 COLONIES/mL LACTOBACILLUS SPECIES Standardized susceptibility testing for this organism is not available. Performed at [facility], [place] [address], [place], [place] [address]    Report Status 07/24/2020 FINAL  Final  CULTURE, BLOOD (ROUTINE X 2) w Reflex to ID Panel     Status: None (Preliminary result)   Collection Time: 07/24/20  9:19 AM   Specimen: BLOOD  Result Value Ref Range Status  Specimen Description BLOOD BLOOD LEFT HAND  Final   Special Requests   Final    BOTTLES DRAWN AEROBIC AND ANAEROBIC Blood Culture adequate volume   Culture   Final    NO GROWTH 4 DAYS Performed at [facility], [address] [place], [place] [address]    Report Status PENDING  Incomplete  CULTURE, BLOOD (ROUTINE X 2) w  Reflex to ID Panel     Status: None (Preliminary result)   Collection Time: 07/24/20  9:19 AM   Specimen: BLOOD  Result Value Ref Range Status   Specimen Description BLOOD BLOOD RIGHT HAND  Final   Special Requests   Final    BOTTLES DRAWN AEROBIC AND ANAEROBIC Blood Culture adequate volume   Culture   Final    NO GROWTH 4 DAYS Performed at [facility], [address] [place], [place] [address]    Report Status PENDING  Incomplete         Radiology Studies: No results found.      Scheduled Meds: . cloNIDine  0.2 mg Transdermal Weekly  . enoxaparin (LOVENOX) injection  40 mg Subcutaneous QPM  . glycopyrrolate  0.1 mg Intravenous Once  . nicotine  21 mg Transdermal Daily  . polyethylene glycol  17 g Oral BID  . potassium chloride  40 mEq Oral Once  . senna  1 tablet Oral Daily   Continuous Infusions: . sodium chloride    . cefTRIAXone (ROCEPHIN)  IV 1 g (07/27/20 2229)  . dextrose 50 mL/hr at 07/27/20 1402     LOS: 6 days    Time spent: 25 mins.More than 50% of that time was spent in counseling and/or coordination of care.      Shelly Coss, MD [facility] P3/15/2022, 7:39 AM

## 2020-07-29 ENCOUNTER — Inpatient Hospital Stay
Admission: AD | Admit: 2020-07-29 | Discharge: 2020-07-29 | Disposition: A | Discharge status: Home / Self Care | Payer: Medicaid Other | Source: Ambulatory Visit | Attending: Internal Medicine | Admitting: Internal Medicine

## 2020-07-29 ENCOUNTER — Inpatient Hospital Stay: Payer: Medicaid Other | Admitting: Certified Registered"

## 2020-07-29 ENCOUNTER — Other Ambulatory Visit: Payer: Self-pay | Admitting: Psychiatry

## 2020-07-29 LAB — BASIC METABOLIC PANEL
Anion gap: 8 (ref 5–15)
BUN: 19 mg/dL (ref 6–20)
CO2: 26 mmol/L (ref 22–32)
Calcium: 8.8 mg/dL — ABNORMAL LOW (ref 8.9–10.3)
Chloride: 110 mmol/L (ref 98–111)
Creatinine, Ser: 0.46 mg/dL (ref 0.44–1.00)
GFR, Estimated: >60 mL/min (ref >60)
Glucose, Bld: 84 mg/dL (ref 70–99)
Potassium: 3.3 mmol/L — ABNORMAL LOW (ref 3.5–5.1)
Sodium: 144 mmol/L (ref 135–145)

## 2020-07-29 LAB — CULTURE, BLOOD (ROUTINE X 2)
Culture: NO GROWTH
Culture: NO GROWTH
Special Requests: ADEQUATE
Special Requests: ADEQUATE

## 2020-07-29 LAB — GLUCOSE, CAPILLARY
Glucose-Capillary: 76 mg/dL (ref 70–99)
Glucose-Capillary: 93 mg/dL (ref 70–99)

## 2020-07-29 MED ORDER — FENTANYL CITRATE (PF) 100 MCG/2ML IJ SOLN: 25.0000 ug | INTRAMUSCULAR | Status: DC | PRN

## 2020-07-29 MED ORDER — METHOHEXITAL SODIUM 0.5 G IJ SOLR
INTRAMUSCULAR | Status: AC
  Filled 2020-07-29: qty 500

## 2020-07-29 MED ORDER — ONDANSETRON HCL 4 MG/2ML IJ SOLN: 4.0000 mg | Freq: Once | INTRAMUSCULAR | Status: DC | PRN

## 2020-07-29 MED ORDER — SODIUM CHLORIDE 0.9 % IV SOLN: 500.0000 mL | Freq: Once | INTRAVENOUS | Status: DC

## 2020-07-29 MED ORDER — METHOHEXITAL SODIUM 100 MG/10ML IV SOSY
PREFILLED_SYRINGE | INTRAVENOUS | Status: DC | PRN
  Administered 2020-07-29: 80 mg via INTRAVENOUS

## 2020-07-29 MED ORDER — SUCCINYLCHOLINE CHLORIDE 20 MG/ML IJ SOLN
INTRAMUSCULAR | Status: DC | PRN
  Administered 2020-07-29: 100 mg via INTRAVENOUS

## 2020-07-29 MED ORDER — POTASSIUM CHLORIDE CRYS ER 20 MEQ PO TBCR
40.0000 meq | EXTENDED_RELEASE_TABLET | Freq: Once | ORAL | Status: AC
  Administered 2020-07-29: 18:00:00 40 meq via ORAL
  Filled 2020-07-29: qty 2

## 2020-07-29 MED ORDER — SUCCINYLCHOLINE CHLORIDE 200 MG/10ML IV SOSY
PREFILLED_SYRINGE | INTRAVENOUS | Status: AC
  Filled 2020-07-29: qty 10

## 2020-07-29 NOTE — TOC Progression Note (Signed)
Transition of Care [facility]) - Progression Note    Patient Details  Name: Isabella [name] MRN: [id number] Date of Birth: 1975-10-12  Transition of Care [facility]) CM/SW [place], RN Phone Number: 07/29/2020, 3:11 PM  Clinical Narrative:    CM called [place], [facility],  Faxed referral to [facility], [facility]. [facility]           Expected Discharge Plan and Services                                                 Social Determinants of Health (SDOH) Interventions    Readmission Risk Interventions No flowsheet data found.

## 2020-07-29 NOTE — Transfer of Care (Signed)
Immediate Anesthesia Transfer of Care Note  Patient: Isabella [name]  Procedure(s) Performed: ECT TX  Patient Location: PACU  Anesthesia Type:General  Level of Consciousness: awake and patient cooperative  Airway & Oxygen Therapy: Patient Spontanous Breathing and Patient connected to face mask oxygen  Post-op Assessment: Report given to RN and Post -op Vital signs reviewed and stable  Post vital signs: Reviewed and stable  Last Vitals:  Vitals Value Taken Time  BP 146/74   Temp    Pulse 89 07/29/20 1314  Resp 16 07/29/20 1314  SpO2 99 % 07/29/20 1314  Vitals shown include unvalidated device data.  Last Pain:  Vitals:   07/29/20 1151  TempSrc: Oral  PainSc: 0-No pain         Complications: No complications documented.

## 2020-07-29 NOTE — Anesthesia Preprocedure Evaluation (Addendum)
Anesthesia Evaluation  Patient identified by MRN, date of birth, ID bandGeneral Assessment Comment:Hx reviewed with pt's mom, pt currently nonverbal  Reviewed: Allergy & Precautions, NPO status , Patient's Chart, lab work & pertinent test results, Unable to perform ROS - Chart review only  History of Anesthesia Complications Negative for: history of anesthetic complications  Airway Mallampati: II  TM Distance: <3 FB Neck ROM: limited   Comment: Unable to assess, pt not cooperative with exam Dental  (+) Chipped Unable to assess, pt not cooperative with exam:   Pulmonary neg sleep apnea, neg COPD, Current Smoker and Patient abstained from smoking.,    Pulmonary exam normal        Cardiovascular hypertension, Pt. on medications (-) CAD, (-) Past MI, (-) Cardiac Stents and (-) CABG Normal cardiovascular exam - Systolic murmurs    Neuro/Psych  Headaches, neg Seizures PSYCHIATRIC DISORDERS Anxiety Depression Bipolar Disorder Schizophrenia    GI/Hepatic negative GI ROS, Neg liver ROS,   Endo/Other  negative endocrine ROSneg diabetes  Renal/GU Renal disease     Musculoskeletal negative musculoskeletal ROS (+)   Abdominal (+) - obese,   Peds  Hematology negative hematology ROS (+)   Anesthesia Other Findings Past Medical History: No date: Bipolar disorder ([place]) No date: Hypertension 07/13/2016: Migraine No date: Schizophrenia ([place])   Reproductive/Obstetrics                             Anesthesia Physical  Anesthesia Plan  ASA: II  Anesthesia Plan: General   Post-op Pain Management:    Induction: Intravenous  PONV Risk Score and Plan: 1 and Ondansetron and TIVA  Airway Management Planned: Mask  Additional Equipment:   Intra-op Plan:   Post-operative Plan:   Informed Consent: I have reviewed the patients History and Physical, chart, labs and discussed the procedure including the risks,  benefits and alternatives for the proposed anesthesia with the patient or authorized representative who has indicated his/her understanding and acceptance.     Dental advisory given  Plan Discussed with: CRNA and Anesthesiologist  Anesthesia Plan Comments: (Patient is catatonic and consent has been obtained via the family.  Patient consented for risks of anesthesia including but not limited to:  - adverse reactions to medications - risk of airway placement if required - damage to eyes, teeth, lips or other oral mucosa - nerve damage due to positioning  - sore throat or hoarseness - Damage to heart, brain, nerves, lungs, other parts of body or loss of life  Patient voiced understanding.)       Anesthesia Quick Evaluation

## 2020-07-29 NOTE — TOC Progression Note (Signed)
Transition of Care [facility]) - Progression Note    Patient Details  Name: Isabella [name] MRN: [id number] Date of Birth: 1975-12-13  Transition of Care [facility]) CM/SW Contact  Su Hilt, RN Phone Number: 07/29/2020, 3:25 PM  Clinical Narrative:     CM called to inquire about inpatient psych bed [place], [facility] are at capacity with no beds available        Expected Discharge Plan and Services                                                 Social Determinants of Health (SDOH) Interventions    Readmission Risk Interventions No flowsheet data found.

## 2020-07-29 NOTE — Plan of Care (Signed)
Shift Summary:  No acute events overnight. Family at bedside at start of shift, attempting to feed patient with little success. Pt continues to be minimally interactive, able to nod/shake head to several questions but otherwise withdrawn. NSR on telemetry, remains on room air, no endorsement of pain or discomfort. Turned q2hr overnight for pressure ulcer prevention. Bladder scan at midnight <462ml, I/O not performed. NPO since midnight for ECT today, family aware of plan and hopeful this third treatment will be effective for patient. Fall/safety precautions in place, rounding performed, needs/concerns addressed during shift.   Problem: Education: Goal: Knowledge of General Education information will improve Description: Including pain rating scale, medication(s)/side effects and non-pharmacologic comfort measures Outcome: Progressing   Problem: Health Behavior/Discharge Planning: Goal: Ability to manage health-related needs will improve Outcome: Progressing   Problem: Clinical Measurements: Goal: Ability to maintain clinical measurements within normal limits will improve Outcome: Progressing Goal: Will remain free from infection Outcome: Progressing Goal: Diagnostic test results will improve Outcome: Progressing Goal: Respiratory complications will improve Outcome: Progressing Goal: Cardiovascular complication will be avoided Outcome: Progressing   Problem: Activity: Goal: Risk for activity intolerance will decrease Outcome: Progressing   Problem: Nutrition: Goal: Adequate nutrition will be maintained Outcome: Progressing   Problem: Coping: Goal: Level of anxiety will decrease Outcome: Progressing   Problem: Elimination: Goal: Will not experience complications related to bowel motility Outcome: Progressing Goal: Will not experience complications related to urinary retention Outcome: Progressing   Problem: Pain Managment: Goal: General experience of comfort will  improve Outcome: Progressing   Problem: Safety: Goal: Ability to remain free from injury will improve Outcome: Progressing   Problem: Skin Integrity: Goal: Risk for impaired skin integrity will decrease Outcome: Progressing

## 2020-07-29 NOTE — TOC Progression Note (Signed)
Transition of Care [facility]) - Progression Note    Patient Details  Name: Isabella [name] MRN: [id number] Date of Birth: 1975/07/06  Transition of Care [facility] CM/SW Contact  Su Hilt, RN Phone Number: 07/29/2020, 2:57 PM  Clinical Narrative:    Damaris Schooner with Kerry Dory with Inpatient BH, He stated that they do not have any beds available. He is not sure when they will have a bed       Expected Discharge Plan and Services                                                 Social Determinants of Health (SDOH) Interventions    Readmission Risk Interventions No flowsheet data found.

## 2020-07-29 NOTE — Progress Notes (Signed)
[facility] PROGRESS NOTE    Valeree Leidy  MBW:[id number] DOB: 1975-10-21 DOA: 07/22/2020 PCP: Patient, No Pcp Per      Brief Narrative:  Mrs. Rosman is a 45 y.o. F with schizoaffective disorder with recurrent catatonia, HTN, migraines, and smoking who presented from [facility] with fever.  Initially presented with 2-3 weeks catatonia, not speaking, moving, or taking anything by mouth.  Was admitted to [facility] and started on ECT, then developed a fever to 101F.  Admitted to the hospitalist service, CT renal study showed no evidence of stone or hydronephrosis.  WBC 17K, Cr 1.7 from baseline 0.8, HR 140s and TBili 1.8.    Interval: 3/9: admitted and started on antibiotics, CT without hydro or stone 3/10: liver US showed fatty liver 3/12: Fever resolved 3/14: completed 6 days cephalosporin, blood and urine cultures negative           Assessment & Plan:  Sepsis due to UTI  Met criteria on admission with fever, leukocytosis, tachycarida and UTI.  Treated with 6 days cephalosporin.  HR normal, afebrile for 3 days. Resolved.   Hypokalemia Mag normal -Supplement K  Acute kidney injury Resolved with fluids  Hypernatremia Resolved with fluids  Hypertension BP normal -Continue clonidine  Smoking  Obesity BMI 33            Disposition: Status is: Inpatient  Remains inpatient appropriate because:Unsafe d/c plan   Dispo: The patient is from: [facility]              Anticipated d/c is to: [facility]              Patient currently is medically stable to d/c.   Difficult to place patient Yes       Level of care: Med-Surg       MDM: The below labs and imaging reports were reviewed and summarized above.  Medication management as above.    DVT prophylaxis: enoxaparin (LOVENOX) injection 40 mg Start: 07/23/20 1800 SCDs Start: 07/22/20 1802  Code Status: FULL Family Communication: Mother and son at bedside    Consultants:    Psychiatry  Procedures:   3/9 CT renal -- gallstones, fibroids, otherwise unremarkable  3/11 US abdomen -- steatosis  3/16 ECT  Antimicrobials:   Vancomycin, flagyl and cefepime 3/9 to 3/11  Ceftriaxone 3/11 to 3/14   Culture data:   3/9 blood cultures x2 -- ng  3/9 urine culture - lactobacillus  3/11 blood cultures no growth           Subjective: A little bit more alert.  No headache, chest pain, dyspnea, malaise, abdominal pain.  Eating a little bit more.  Objective: Vitals:   07/28/20 2044 07/29/20 0019 07/29/20 0437 07/29/20 0724  BP: 120/75 122/75 107/63 122/76  Pulse: 83 82 76 75  Resp: 18 19 18 15   Temp: 99 F (37.2 C) 98.3 F (36.8 C) 97.6 F (36.4 C) 98.6 F (37 C)  TempSrc:  Oral  Oral  SpO2: 97% 97% 97% 97%  Weight:      Height:        Intake/Output Summary (Last 24 hours) at 07/29/2020 1455 Last data filed at 07/28/2020 2100 Gross per 24 hour  Intake 50 ml  Output --  Net 50 ml   Filed Weights   07/25/20 0003 07/27/20 0100 07/27/20 1047  Weight: 88.1 kg 89.6 kg 89.6 kg    Examination: General appearance: obese  adult female,awake lying in bed, responds to questions, no  obvious distress.   HEENT: Anicteric, conjunctiva pink, lids and lashes normal. No nasal deformity, discharge, epistaxis.  Lips moist.   Skin: Warm and dry.  no jaundice.  No suspicious rashes or lesions. Cardiac: RRR, nl S1-S2, no murmurs appreciated.  Capillary refill is brisk.  JVP not visible.  No LE edema.  Radial pulses 2+ and symmetric. Respiratory: Normal respiratory rate and rhythm.  CTAB without rales or wheezes. Abdomen: Abdomen soft.  No  TTP or guarding. No ascites, distension, hepatosplenomegaly.   MSK: No deformities or effusions. Neuro: Awake, but psychomotor slowing is noted.  Moves upper extremities with global weakness and slowing.  EOM appear normal Speech fluent.    Psych: Sensorium intact and responding to questions, attention diminished affect  constrained, paucity of speech     Data Reviewed: I have personally reviewed following labs and imaging studies:  CBC: Recent Labs  Lab 07/23/20 0625 07/24/20 0509 07/25/20 0353 07/26/20 0618 07/27/20 0413  WBC 16.0* 13.5* 13.6* 19.3* 12.4*  HGB 16.0* 14.8 14.0 15.4* 13.3  HCT 49.6* 45.4 43.0 47.1* 40.7  MCV 95.0 95.4 96.0 95.5 96.0  PLT 322 290 275 283 263   Basic Metabolic Panel: Recent Labs  Lab 07/22/20 1434 07/23/20 0625 07/24/20 0509 07/25/20 0353 07/26/20 0618 07/27/20 0413 07/28/20 0536 07/29/20 0510  NA 147* 150* 154* 154* 145 144 143 144  K 4.1 3.5 3.5 3.2* 3.4* 3.0* 3.2* 3.3*  CL 115* 121* 125* 123* 113* 112* 111 110  CO2 17* 15* 20* 21* 19* 26 25 26   GLUCOSE 144* 109* 95 112* 137* 105* 95 84  BUN 58* 49* 33* 23* 31* 15 11 19   CREATININE 1.75* 1.00 0.84 0.81 1.15* 0.58 0.55 0.46  CALCIUM 9.7 9.3 9.0 8.8* 9.5 8.4* 8.6* 8.8*  MG 2.4 2.2 2.1  --   --   --   --   --   PHOS 3.6 3.5 2.8  --   --   --   --   --    GFR: Estimated Creatinine Clearance: 95.3 mL/min (by C-G formula based on SCr of 0.46 mg/dL). Liver Function Tests: Recent Labs  Lab 07/22/20 1434 07/24/20 0509 07/25/20 0353  AST 78* 30 25  ALT 74* 41 30  ALKPHOS 96 59 56  BILITOT 1.8* 1.5* 1.1  PROT 8.9* 7.0 6.7  ALBUMIN 4.4 3.5 3.4*   No results for input(s): LIPASE, AMYLASE in the last 168 hours. No results for input(s): AMMONIA in the last 168 hours. Coagulation Profile: Recent Labs  Lab 07/22/20 1822  INR 1.2   Cardiac Enzymes: No results for input(s): CKTOTAL, CKMB, CKMBINDEX, TROPONINI in the last 168 hours. BNP (last 3 results) No results for input(s): PROBNP in the last 8760 hours. HbA1C: No results for input(s): HGBA1C in the last 72 hours. CBG: Recent Labs  Lab 07/28/20 0431 07/28/20 0831 07/29/20 0440  GLUCAP 87 90 76   Lipid Profile: No results for input(s): CHOL, HDL, LDLCALC, TRIG, CHOLHDL, LDLDIRECT in the last 72 hours. Thyroid Function Tests: No results  for input(s): TSH, T4TOTAL, FREET4, T3FREE, THYROIDAB in the last 72 hours. Anemia Panel: No results for input(s): VITAMINB12, FOLATE, FERRITIN, TIBC, IRON, RETICCTPCT in the last 72 hours. Urine analysis:    Component Value Date/Time   COLORURINE AMBER (A) 07/22/2020 1756   APPEARANCEUR CLOUDY (A) 07/22/2020 1756   LABSPEC 1.028 07/22/2020 1756   PHURINE 5.0 07/22/2020 1756   GLUCOSEU NEGATIVE 07/22/2020 1756   HGBUR NEGATIVE 07/22/2020 1756   BILIRUBINUR SMALL (  A) 07/22/2020 1756   KETONESUR 20 (A) 07/22/2020 1756   PROTEINUR 100 (A) 07/22/2020 1756   NITRITE NEGATIVE 07/22/2020 1756   LEUKOCYTESUR NEGATIVE 07/22/2020 1756   Sepsis Labs: @LABRCNTIP (procalcitonin:4,lacticacidven:4)  ) Recent Results (from the past 240 hour(s))  Resp Panel by RT-PCR (Flu A&B, Covid) Nasopharyngeal Swab     Status: None   Collection Time: 07/19/20  3:52 PM   Specimen: Nasopharyngeal Swab; Nasopharyngeal(NP) swabs in vial transport medium  Result Value Ref Range Status   SARS Coronavirus 2 by RT PCR NEGATIVE NEGATIVE Final    Comment: (NOTE) SARS-CoV-2 target nucleic acids are NOT DETECTED.  The SARS-CoV-2 RNA is generally detectable in upper respiratory specimens during the acute phase of infection. The lowest concentration of SARS-CoV-2 viral copies this assay can detect is 138 copies/mL. A negative result does not preclude SARS-Cov-2 infection and should not be used as the sole basis for treatment or other patient management decisions. A negative result may occur with  improper specimen collection/handling, submission of specimen other than nasopharyngeal swab, presence of viral mutation(s) within the areas targeted by this assay, and inadequate number of viral copies(<138 copies/mL). A negative result must be combined with clinical observations, patient history, and epidemiological information. The expected result is Negative.  Fact Sheet for Patients:   EntrepreneurPulse.com.au  Fact Sheet for Healthcare Providers:  IncredibleEmployment.be  This test is no t yet approved or cleared by the [place] FDA and  has been authorized for detection and/or diagnosis of SARS-CoV-2 by FDA under an Emergency Use Authorization (EUA). This EUA will remain  in effect (meaning this test can be used) for the duration of the COVID-19 declaration under Section 564(b)(1) of the Act, 21 U.S.C.section 360bbb-3(b)(1), unless the authorization is terminated  or revoked sooner.       Influenza A by PCR NEGATIVE NEGATIVE Final   Influenza B by PCR NEGATIVE NEGATIVE Final    Comment: (NOTE) The Xpert Xpress SARS-CoV-2/FLU/RSV plus assay is intended as an aid in the diagnosis of influenza from Nasopharyngeal swab specimens and should not be used as a sole basis for treatment. Nasal washings and aspirates are unacceptable for Xpert Xpress SARS-CoV-2/FLU/RSV testing.  Fact Sheet for Patients: EntrepreneurPulse.com.au  Fact Sheet for Healthcare Providers: IncredibleEmployment.be  This test is not yet approved or cleared by the [place] FDA and has been authorized for detection and/or diagnosis of SARS-CoV-2 by FDA under an Emergency Use Authorization (EUA). This EUA will remain in effect (meaning this test can be used) for the duration of the COVID-19 declaration under Section 564(b)(1) of the Act, 21 U.S.C. section 360bbb-3(b)(1), unless the authorization is terminated or revoked.  Performed at [facility], [address] [place], [place] [address]   CULTURE, BLOOD (ROUTINE X 2) w Reflex to ID Panel     Status: None   Collection Time: 07/22/20  6:22 PM   Specimen: BLOOD  Result Value Ref Range Status   Specimen Description BLOOD BLOOD LEFT HAND  Final   Special Requests   Final    BOTTLES DRAWN AEROBIC AND ANAEROBIC Blood Culture adequate volume   Culture    Final    NO GROWTH 5 DAYS Performed at [facility], [address] [place], [place] [address]    Report Status 07/27/2020 FINAL  Final  CULTURE, BLOOD (ROUTINE X 2) w Reflex to ID Panel     Status: None   Collection Time: 07/22/20  6:23 PM   Specimen: BLOOD  Result Value Ref  Range Status   Specimen Description BLOOD RIGHT ANTECUBITAL  Final   Special Requests   Final    BOTTLES DRAWN AEROBIC AND ANAEROBIC Blood Culture adequate volume   Culture   Final    NO GROWTH 5 DAYS Performed at [facility], [address], [place], [place] [address]    Report Status 07/27/2020 FINAL  Final  Urine Culture     Status: Abnormal   Collection Time: 07/22/20  6:30 PM   Specimen: Urine, Random  Result Value Ref Range Status   Specimen Description   Final    URINE, RANDOM Performed at [facility], [address] [place], [place] [address]    Special Requests   Final    NONE Performed at [facility], [address] [place], [place] [address]    Culture (A)  Final    20,000 COLONIES/mL LACTOBACILLUS SPECIES Standardized susceptibility testing for this organism is not available. Performed at [facility], [place] [address] [place], [place] [address]    Report Status 07/24/2020 FINAL  Final  CULTURE, BLOOD (ROUTINE X 2) w Reflex to ID Panel     Status: None   Collection Time: 07/24/20  9:19 AM   Specimen: BLOOD  Result Value Ref Range Status   Specimen Description BLOOD BLOOD LEFT HAND  Final   Special Requests   Final    BOTTLES DRAWN AEROBIC AND ANAEROBIC Blood Culture adequate volume   Culture   Final    NO GROWTH 5 DAYS Performed at [facility], [address] [place], [place] [address]    Report Status 07/29/2020 FINAL  Final  CULTURE, BLOOD (ROUTINE X 2) w Reflex to ID Panel     Status: None   Collection Time: 07/24/20  9:19 AM   Specimen: BLOOD  Result Value Ref Range Status   Specimen Description BLOOD BLOOD RIGHT  HAND  Final   Special Requests   Final    BOTTLES DRAWN AEROBIC AND ANAEROBIC Blood Culture adequate volume   Culture   Final    NO GROWTH 5 DAYS Performed at [facility], [address] [place], [place] [address]    Report Status 07/29/2020 FINAL  Final         Radiology Studies: No results found.      Scheduled Meds: . cloNIDine  0.2 mg Transdermal Weekly  . enoxaparin (LOVENOX) injection  40 mg Subcutaneous QPM  . glycopyrrolate  0.1 mg Intravenous Once  . nicotine  21 mg Transdermal Daily  . polyethylene glycol  17 g Oral BID  . senna  1 tablet Oral Daily   Continuous Infusions: . sodium chloride       LOS: 7 days    Time spent: 25 minutes    Edwin Dada, MD [facility] 07/29/2020, 2:55 PM     Please page though [place] or Epic secure chat:  For [employer], Adult nurse
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
## 2020-07-29 NOTE — Procedures (Signed)
ECT SERVICES Physician's Interval Evaluation & Treatment Note  Patient Identification: Isabella [name] MRN:  [id number] Date of Evaluation:  07/29/2020 TX #: 3  MADRS:   MMSE:   P.E. Findings:  Patient is stable physically  Psychiatric Interval Note:  Slightly more responsive prior to ECT opening eyes and responding to requests  Subjective:  [name] seen for evaluation for Electroconvulsive Therapy. Not verbal more than a couple of words yet  Treatment Summary:   []   Right Unilateral             [x]  Bilateral   % Energy : 1.0 ms 60%   Impedance: 2430 ohms  Seizure Energy Index: 55,767 V squared  Postictal Suppression Index: 74%  Seizure Concordance Index: 94%  Medications  Pre Shock: Robinul 0.1 mg Toradol 30 mg Brevital 80 mg succinylcholine 100 mg  Post Shock: 33 seconds EMG 44 seconds EEG  Seizure Duration: 33 seconds EMG 44 seconds EEG   Comments: Patient was talking and animated after treatment  Lungs:  [x]   Clear to auscultation               []  Other:   Heart:    [x]   Regular rhythm             []  irregular rhythm    [x]   Previous H&P reviewed, patient examined and there are NO CHANGES                 []   Previous H&P reviewed, patient examined and there are changes noted.   [name], MD 3/16/20225:55 PM   Level note is in

## 2020-07-30 LAB — GLUCOSE, CAPILLARY
Glucose-Capillary: 79 mg/dL (ref 70–99)
Glucose-Capillary: 93 mg/dL (ref 70–99)
Glucose-Capillary: 94 mg/dL (ref 70–99)

## 2020-07-30 NOTE — Progress Notes (Signed)
[facility] PROGRESS NOTE    Isabella [name]  ZOX:[id number] DOB: May 31, 1975 DOA: 07/22/2020 PCP: Patient, No Pcp Per      Brief Narrative:  Isabella [name] is a 45 y.o. F with schizoaffective disorder with recurrent catatonia, HTN, migraines, and smoking who presented from [facility] with fever.  Initially presented with 2-3 weeks catatonia, not speaking, moving, or taking anything by mouth.  Was admitted to [facility] and started on ECT, then developed a fever to 101F.  Admitted to the hospitalist service, CT renal study showed no evidence of stone or hydronephrosis.  WBC 17K, Cr 1.7 from baseline 0.8, HR 140s and TBili 1.8.    Interval: 3/9: admitted and started on antibiotics, CT without hydro or stone 3/10: liver US showed fatty liver 3/12: Fever resolved 3/14: completed 6 days cephalosporin, blood and urine cultures negative           Assessment & Plan:  Sepsis due to UTI Resolved   Dysuria again -Check UA, urine culture   Hypokalemia Mag normal -Repeat K tomorrow  Acute kidney injury Resolved with fluids  Hypernatremia Resolved with fluids  Hypertension BP controlled -Continue clonidine  Smoking  Obesity BMI 33            Disposition: Status is: Inpatient  Remains inpatient appropriate because:Unsafe d/c plan   Dispo: The patient is from: [facility]              Anticipated d/c is to: [facility]              Patient currently is medically stable to d/c.   Difficult to place patient Yes    Patient was initially admitted to [facility] for catatonia.  While there, developed UTI with sepsis, transferred to medical floor.  She has now been treated for sepsis and is medically stable for transfer back to [facility].  Currently there are no beds. Psychiatry at present still feel she warrants inpatietn psychiatry treatment, and she is under voluntary commitment.   Level of care: Med-Surg       MDM: The below labs and imaging reports were  reviewed and summarized above.  Medication management as above.    DVT prophylaxis: enoxaparin (LOVENOX) injection 40 mg Start: 07/23/20 1800 SCDs Start: 07/22/20 1802  Code Status: FULL Family Communication: None present    Consultants:   Psychiatry  Procedures:   3/9 CT renal -- gallstones, fibroids, otherwise unremarkable  3/11 US abdomen -- steatosis  3/16 ECT  Antimicrobials:   Vancomycin, flagyl and cefepime 3/9 to 3/11  Ceftriaxone 3/11 to 3/14   Culture data:   3/9 blood cultures x2 -- ng  3/9 urine culture - lactobacillus  3/11 blood cultures no growth           Subjective: More alert today.  No headache, chest pain, dyspnea, abdominal pain, vomiting, malaise.  Appetite is good.  She has some dysuria.  She has some suprapubic pain.  Objective: Vitals:   07/30/20 0421 07/30/20 0820 07/30/20 1329 07/30/20 1520  BP: 119/76 115/81 116/74 120/82  Pulse: 78 73 76 77  Resp: 16 16 18 15   Temp: 98.1 F (36.7 C) 98.8 F (37.1 C) (!) 97.5 F (36.4 C) 98.9 F (37.2 C)  TempSrc:  Oral Oral   SpO2: 94% 98% 97% 98%  Weight:      Height:        Intake/Output Summary (Last 24 hours) at 07/30/2020 1735 Last data filed at 07/29/2020 2300 Gross per 24 hour  Intake 120 ml  Output --  Net 120 ml   Filed Weights   07/25/20 0003 07/27/20 0100 07/27/20 1047  Weight: 88.1 kg 89.6 kg 89.6 kg    Examination: General appearance: Obese adult female, sitting up in bed, watching television, appears to have some psychomotor slowing     HEENT:    Skin:  Cardiac: RRR, no murmurs, no lower extremity Respiratory: Normal respiratory rate and rhythm, lungs clear without rales or wheezes Abdomen: Abdomen soft, no guarding, she has some mild periumbilical tenderness to palpation, no masses appreciated, no rigidity or rebound. MSK: Normal muscle bulk and tone Neuro: Awake and alert, psychomotor slowing noted, moves all extremities with generalized weakness and global  slowing, extraocular movements intact, speech slow but fluent. Psych: Surname intact, attention diminished, affect constrained   Data Reviewed: I have personally reviewed following labs and imaging studies:  CBC: Recent Labs  Lab 07/24/20 0509 07/25/20 0353 07/26/20 0618 07/27/20 0413  WBC 13.5* 13.6* 19.3* 12.4*  HGB 14.8 14.0 15.4* 13.3  HCT 45.4 43.0 47.1* 40.7  MCV 95.4 96.0 95.5 96.0  PLT 290 275 283 268   Basic Metabolic Panel: Recent Labs  Lab 07/24/20 0509 07/25/20 0353 07/26/20 0618 07/27/20 0413 07/28/20 0536 07/29/20 0510  NA 154* 154* 145 144 143 144  K 3.5 3.2* 3.4* 3.0* 3.2* 3.3*  CL 125* 123* 113* 112* 111 110  CO2 20* 21* 19* 26 25 26   GLUCOSE 95 112* 137* 105* 95 84  BUN 33* 23* 31* 15 11 19   CREATININE 0.84 0.81 1.15* 0.58 0.55 0.46  CALCIUM 9.0 8.8* 9.5 8.4* 8.6* 8.8*  MG 2.1  --   --   --   --   --   PHOS 2.8  --   --   --   --   --    GFR: Estimated Creatinine Clearance: 95.3 mL/min (by C-G formula based on SCr of 0.46 mg/dL). Liver Function Tests: Recent Labs  Lab 07/24/20 0509 07/25/20 0353  AST 30 25  ALT 41 30  ALKPHOS 59 56  BILITOT 1.5* 1.1  PROT 7.0 6.7  ALBUMIN 3.5 3.4*   No results for input(s): LIPASE, AMYLASE in the last 168 hours. No results for input(s): AMMONIA in the last 168 hours. Coagulation Profile: No results for input(s): INR, PROTIME in the last 168 hours. Cardiac Enzymes: No results for input(s): CKTOTAL, CKMB, CKMBINDEX, TROPONINI in the last 168 hours. BNP (last 3 results) No results for input(s): PROBNP in the last 8760 hours. HbA1C: No results for input(s): HGBA1C in the last 72 hours. CBG: Recent Labs  Lab 07/29/20 0440 07/29/20 1643 07/30/20 0822 07/30/20 1330 07/30/20 1518  GLUCAP 76 93 79 93 94   Lipid Profile: No results for input(s): CHOL, HDL, LDLCALC, TRIG, CHOLHDL, LDLDIRECT in the last 72 hours. Thyroid Function Tests: No results for input(s): TSH, T4TOTAL, FREET4, T3FREE, THYROIDAB  in the last 72 hours. Anemia Panel: No results for input(s): VITAMINB12, FOLATE, FERRITIN, TIBC, IRON, RETICCTPCT in the last 72 hours. Urine analysis:    Component Value Date/Time   COLORURINE AMBER (A) 07/22/2020 1756   APPEARANCEUR CLOUDY (A) 07/22/2020 1756   LABSPEC 1.028 07/22/2020 1756   PHURINE 5.0 07/22/2020 1756   GLUCOSEU NEGATIVE 07/22/2020 1756   HGBUR NEGATIVE 07/22/2020 1756   BILIRUBINUR SMALL (A) 07/22/2020 1756   KETONESUR 20 (A) 07/22/2020 1756   PROTEINUR 100 (A) 07/22/2020 1756   NITRITE NEGATIVE 07/22/2020 1756   LEUKOCYTESUR NEGATIVE 07/22/2020 1756  Sepsis Labs: @LABRCNTIP (procalcitonin:4,lacticacidven:4)  ) Recent Results (from the past 240 hour(s))  CULTURE, BLOOD (ROUTINE X 2) w Reflex to ID Panel     Status: None   Collection Time: 07/22/20  6:22 PM   Specimen: BLOOD  Result Value Ref Range Status   Specimen Description BLOOD BLOOD LEFT HAND  Final   Special Requests   Final    BOTTLES DRAWN AEROBIC AND ANAEROBIC Blood Culture adequate volume   Culture   Final    NO GROWTH 5 DAYS Performed at [facility], [address] [place], [place] [address]    Report Status 07/27/2020 FINAL  Final  CULTURE, BLOOD (ROUTINE X 2) w Reflex to ID Panel     Status: None   Collection Time: 07/22/20  6:23 PM   Specimen: BLOOD  Result Value Ref Range Status   Specimen Description BLOOD RIGHT ANTECUBITAL  Final   Special Requests   Final    BOTTLES DRAWN AEROBIC AND ANAEROBIC Blood Culture adequate volume   Culture   Final    NO GROWTH 5 DAYS Performed at [facility], [address] [place], [place] [address]    Report Status 07/27/2020 FINAL  Final  Urine Culture     Status: Abnormal   Collection Time: 07/22/20  6:30 PM   Specimen: Urine, Random  Result Value Ref Range Status   Specimen Description   Final    URINE, RANDOM Performed at [facility], [address], [place], [place] [address]    Special Requests   Final     NONE Performed at [facility], [address] [place], [place] [address]    Culture (A)  Final    20,000 COLONIES/mL LACTOBACILLUS SPECIES Standardized susceptibility testing for this organism is not available. Performed at [facility], [place] [address] [place], [place] [address]    Report Status 07/24/2020 FINAL  Final  CULTURE, BLOOD (ROUTINE X 2) w Reflex to ID Panel     Status: None   Collection Time: 07/24/20  9:19 AM   Specimen: BLOOD  Result Value Ref Range Status   Specimen Description BLOOD BLOOD LEFT HAND  Final   Special Requests   Final    BOTTLES DRAWN AEROBIC AND ANAEROBIC Blood Culture adequate volume   Culture   Final    NO GROWTH 5 DAYS Performed at [facility], [address] [place], [place] [address]    Report Status 07/29/2020 FINAL  Final  CULTURE, BLOOD (ROUTINE X 2) w Reflex to ID Panel     Status: None   Collection Time: 07/24/20  9:19 AM   Specimen: BLOOD  Result Value Ref Range Status   Specimen Description BLOOD BLOOD RIGHT HAND  Final   Special Requests   Final    BOTTLES DRAWN AEROBIC AND ANAEROBIC Blood Culture adequate volume   Culture   Final    NO GROWTH 5 DAYS Performed at [facility], [address] [place], [place] [address]    Report Status 07/29/2020 FINAL  Final         Radiology Studies: No results found.      Scheduled Meds: . cloNIDine  0.2 mg Transdermal Weekly  . enoxaparin (LOVENOX) injection  40 mg Subcutaneous QPM  . glycopyrrolate  0.1 mg Intravenous Once  . nicotine  21 mg Transdermal Daily  . polyethylene glycol  17 g Oral BID  . senna  1 tablet Oral Daily   Continuous Infusions: .  sodium chloride       LOS: 8 days    Time spent: 25 minutes    Edwin Dada, MD [facility] 07/30/2020, 5:35 PM     Please page though [place] or Epic secure chat:  For [employer], Adult nurse

## 2020-07-30 NOTE — Evaluation (Signed)
Physical Therapy Evaluation [name] MRN: [id number] DOB: 12-Dec-1975 Today's Date: 07/30/2020   History of Present Illness  45 y.o. F with schizoaffective disorder with recurrent catatonia, HTN, migraines, and smoking.  Initially to behavioral med with 2-3 weeks catatonia, not speaking, moving, or taking anything by mouth.  Up to medical floor with fever.  Clinical Impression  Pt awake and much more aware than notes have indicated she has been in the last few weeks.  She reports some dizziness but no pain or other specific complaints t/o the session.  She was able to get up to sitting, standing and ambulate >100 ft w/o assist.  Interpreter utilized t/o the session, however appeared to understand and respond most of the time (in [place]) much of the time even before the translation.  Pt did relatively well with mobility and activity, unsure of exact baseline but it appears she is not far from her baseline and relatively independent with in-home activity.     Follow Up Recommendations Supervision - Intermittent    Equipment Recommendations  None recommended by PT    Recommendations for Other Services       Precautions / Restrictions Precautions Precautions: Fall Restrictions Weight Bearing Restrictions: No      Mobility  Bed Mobility Overal bed mobility: Modified Independent             General bed mobility comments: Pt was able to get to sitting EOB w/o assist, maintains balance with confidence    Transfers Overall transfer level: Modified independent Equipment used: Rolling walker (2 wheeled)             General transfer comment: Pt needed some extra encouragement to initiate rising to standing, but did so on her own w/o physical assist  Ambulation/Gait Ambulation/Gait assistance: Supervision Gait Distance (Feet): 125 Feet Assistive device: Rolling walker (2 wheeled);1 person hand held assist;None       General Gait  Details: Multiple loops in the room as pt did not wish to go into the hallway.  We did use a FWW for the first loop and she did have some very minimal unsteadiness w/o LOBs, confidence and stability improved with increased distance.  Second loop we did HHA and for the final few she was able to ambulate with slow but steady gait w/o AD or unsteadiness.  Stairs            Wheelchair Mobility    Modified Rankin (Stroke Patients Only)       Balance Overall balance assessment: Modified Independent (some initial guarding/unsteadiness on standing that resolved quickly.  No LOBs or overt safety problems.)                                           Pertinent Vitals/Pain Pain Assessment: No/denies pain    Home Living Family/patient expects to be discharged to:: Private residence Living Arrangements: Other relatives Available Help at Discharge: Family           Home Equipment: Kasandra Knudsen - single point;Walker - 2 wheels      Prior Function Level of Independence: Independent         Comments: Pt not overly forthwith/thorough answering most questions but is appears she is not out of the house a lot but is able to manage ADLs, etc w/o assist     Hand Dominance  Extremity/Trunk Assessment   Upper Extremity Assessment Upper Extremity Assessment: Generalized weakness    Lower Extremity Assessment Lower Extremity Assessment: Generalized weakness       Communication   Communication: Prefers language other than English (interp Eugenie Filler #[phone])  Cognition Arousal/Alertness: Awake/alert Behavior During Therapy: Flat affect Overall Cognitive Status:  (per psych, pt was approriate, if a little distant, t/o session)                                        General Comments General comments (skin integrity, edema, etc.): Pt only c/o    Exercises     Assessment/Plan    PT Assessment Patient needs continued PT services  PT Problem  List Decreased strength;Decreased range of motion;Decreased activity tolerance;Decreased balance;Decreased mobility;Decreased cognition;Decreased knowledge of use of DME;Decreased knowledge of precautions;Decreased safety awareness       PT Treatment Interventions Gait training;Stair training;Functional mobility training;Therapeutic activities;Therapeutic exercise;Balance training;Cognitive remediation;Patient/family education    PT Goals (Current goals can be found in the Care Plan section)  Acute Rehab PT Goals Patient Stated Goal: none stated PT Goal Formulation: With patient Time For Goal Achievement: 08/13/20 Potential to Achieve Goals: Good    Frequency Min 2X/week   Barriers to discharge        Co-evaluation               AM-PAC PT "6 Clicks" Mobility  Outcome Measure Help needed turning from your back to your side while in a flat bed without using bedrails?: None Help needed moving from lying on your back to sitting on the side of a flat bed without using bedrails?: None Help needed moving to and from a bed to a chair (including a wheelchair)?: None Help needed standing up from a chair using your arms (e.g., wheelchair or bedside chair)?: None Help needed to walk in hospital room?: A Little Help needed climbing 3-5 steps with a railing? : A Little 6 Click Score: 22    End of Session Equipment Utilized During Treatment: Gait belt Activity Tolerance: Patient tolerated treatment well Patient left: with chair alarm set;with call bell/phone within reach Nurse Communication: Mobility status PT Visit Diagnosis: Muscle weakness (generalized) (M62.81);Difficulty in walking, not elsewhere classified (R26.2)    Time: 0900-0930 PT Time Calculation (min) (ACUTE ONLY): 30 min   Charges:   PT Evaluation $PT Eval Low Complexity: 1 Low PT Treatments $Therapeutic Activity: 8-22 mins        Kreg Shropshire, DPT 07/30/2020, 12:53 PM

## 2020-07-30 NOTE — Anesthesia Postprocedure Evaluation (Signed)
Anesthesia Post Note  Patient: Isabella [name]  Procedure(s) Performed: ECT TX  Patient location during evaluation: PACU Anesthesia Type: General Level of consciousness: awake and alert and oriented Pain management: pain level controlled Vital Signs Assessment: post-procedure vital signs reviewed and stable Respiratory status: spontaneous breathing Cardiovascular status: blood pressure returned to baseline Anesthetic complications: no   No complications documented.   Last Vitals:  Vitals:   07/29/20 1330 07/29/20 1334  BP: 132/74 132/74  Pulse:  88  Resp: (!) 24 (!) 24  Temp:  37.6 C  SpO2:  95%    Last Pain:  Vitals:   07/29/20 1334  TempSrc:   PainSc: 0-No pain                 Alane Hanssen

## 2020-07-31 ENCOUNTER — Other Ambulatory Visit: Payer: Self-pay | Admitting: Psychiatry

## 2020-07-31 ENCOUNTER — Inpatient Hospital Stay: Payer: Medicaid Other | Admitting: Registered Nurse

## 2020-07-31 ENCOUNTER — Inpatient Hospital Stay
Admission: AD | Admit: 2020-07-31 | Discharge: 2020-07-31 | Disposition: A | Discharge status: Home / Self Care | Payer: Medicaid Other | Source: Ambulatory Visit | Attending: Internal Medicine | Admitting: Internal Medicine

## 2020-07-31 LAB — URINALYSIS, ROUTINE W REFLEX MICROSCOPIC
Bilirubin Urine: NEGATIVE
Glucose, UA: NEGATIVE mg/dL
Hgb urine dipstick: NEGATIVE
Ketones, ur: 80 mg/dL — AB
Leukocytes,Ua: NEGATIVE
Nitrite: NEGATIVE
Protein, ur: NEGATIVE mg/dL
Specific Gravity, Urine: 1.025 (ref 1.005–1.030)
pH: 6 (ref 5.0–8.0)

## 2020-07-31 LAB — CBC
HCT: 41.4 % (ref 36.0–46.0)
Hemoglobin: 14.2 g/dL (ref 12.0–15.0)
MCH: 31.7 pg (ref 26.0–34.0)
MCHC: 34.3 g/dL (ref 30.0–36.0)
MCV: 92.4 fL (ref 80.0–100.0)
Platelets: 258 10*3/uL (ref 150–400)
RBC: 4.48 MIL/uL (ref 3.87–5.11)
RDW: 11.9 % (ref 11.5–15.5)
WBC: 9.6 10*3/uL (ref 4.0–10.5)
nRBC: 0 % (ref 0.0–0.2)

## 2020-07-31 LAB — BASIC METABOLIC PANEL
Anion gap: 8 (ref 5–15)
BUN: 12 mg/dL (ref 6–20)
CO2: 25 mmol/L (ref 22–32)
Calcium: 8.7 mg/dL — ABNORMAL LOW (ref 8.9–10.3)
Chloride: 105 mmol/L (ref 98–111)
Creatinine, Ser: 0.43 mg/dL — ABNORMAL LOW (ref 0.44–1.00)
GFR, Estimated: >60 mL/min (ref >60)
Glucose, Bld: 79 mg/dL (ref 70–99)
Potassium: 3.9 mmol/L (ref 3.5–5.1)
Sodium: 138 mmol/L (ref 135–145)

## 2020-07-31 MED ORDER — METHOHEXITAL SODIUM 0.5 G IJ SOLR
INTRAMUSCULAR | Status: AC
  Filled 2020-07-31: qty 500

## 2020-07-31 MED ORDER — SUCCINYLCHOLINE CHLORIDE 200 MG/10ML IV SOSY
PREFILLED_SYRINGE | INTRAVENOUS | Status: AC
  Filled 2020-07-31: qty 10

## 2020-07-31 MED ORDER — METHOHEXITAL SODIUM 100 MG/10ML IV SOSY
PREFILLED_SYRINGE | INTRAVENOUS | Status: DC | PRN
  Administered 2020-07-31: 80 mg via INTRAVENOUS

## 2020-07-31 MED ORDER — LABETALOL HCL 5 MG/ML IV SOLN
INTRAVENOUS | Status: AC
  Filled 2020-07-31: qty 8

## 2020-07-31 MED ORDER — SUCCINYLCHOLINE CHLORIDE 20 MG/ML IJ SOLN
INTRAMUSCULAR | Status: DC | PRN
  Administered 2020-07-31: 100 mg via INTRAVENOUS
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
## 2020-07-31 NOTE — Progress Notes (Signed)
Called Dr. Lubertha Basque concerning the patient's BP being low at 90/66 after receiving 15 Labetalol so she wants the patient to stay for 30 minutes to make sure it is not declining.I will continue to monitor the patient.

## 2020-07-31 NOTE — Anesthesia Procedure Notes (Signed)
Performed by: Alyana Kreiter, CRNA Pre-anesthesia Checklist: Patient identified, Emergency Drugs available, Suction available and Patient being monitored Patient Re-evaluated:Patient Re-evaluated prior to induction Oxygen Delivery Method: Circle system utilized Preoxygenation: Pre-oxygenation with 100% oxygen Induction Type: IV induction Ventilation: Mask ventilation without difficulty and Mask ventilation throughout procedure Airway Equipment and Method: Bite block Placement Confirmation: positive ETCO2 Dental Injury: Teeth and Oropharynx as per pre-operative assessment        

## 2020-07-31 NOTE — Transfer of Care (Signed)
Immediate Anesthesia Transfer of Care Note  Patient: [name]  Procedure(s) Performed: ECT TX  Patient Location: PACU  Anesthesia Type:General  Level of Consciousness: sedated  Airway & Oxygen Therapy: Patient Spontanous Breathing  Post-op Assessment: Report given to RN and Post -op Vital signs reviewed and stable  Post vital signs: Reviewed and stable  Last Vitals:  Vitals Value Taken Time  BP 123/94 07/31/20 1314  Temp    Pulse 108 07/31/20 1314  Resp 22 07/31/20 1314  SpO2 92 % 07/31/20 1314  Vitals shown include unvalidated device data.  Last Pain:  Vitals:   07/31/20 1314  PainSc: 0-No pain         Complications: No complications documented.

## 2020-07-31 NOTE — Progress Notes (Signed)
[facility] PROGRESS NOTE    Isabella [name]  URK:[id number] DOB: 01-18-1976 DOA: 07/22/2020 PCP: Patient, No Pcp Per      Brief Narrative:  Isabella [name] is a 45 y.o. F with schizoaffective disorder with recurrent catatonia, HTN, migraines, and smoking who presented from [facility] with fever.  Initially presented with 2-3 weeks catatonia, not speaking, moving, or taking anything by mouth.  Was admitted to [facility] and started on ECT, then developed a fever to 101F.  Admitted to the hospitalist service, CT renal study showed no evidence of stone or hydronephrosis.  WBC 17K, Cr 1.7 from baseline 0.8, HR 140s and TBili 1.8.    Interval: 3/9: admitted and started on antibiotics, CT without hydro or stone 3/10: liver US showed fatty liver 3/12: Fever resolved 3/14: completed 6 days cephalosporin, blood and urine cultures negative           Assessment & Plan:  Sepsis due to UTI Resolved   Periumbilical pain UA clean.  Not sure what this is.   Not UTI   Hypokalemia -Supplement K  Acute kidney injury Resolved with fluids  Hypernatremia Resolved with fluids  Hypertension BP soft -Hold clonidine  Smoking  Obesity BMI 33            Disposition: Status is: Inpatient  Remains inpatient appropriate because:Unsafe d/c plan   Dispo: The patient is from: [facility]              Anticipated d/c is to: [facility]              Patient currently is medically stable to d/c.   Difficult to place patient Yes    Patient was initially admitted to [facility] for catatonia.  While there, developed UTI with sepsis, transferred to medical floor.  She has now been treated for sepsis and is medically stable for transfer back to [facility].  Currently there are no beds. Psychiatry at present still feel she warrants inpatietn psychiatry treatment, and she is under voluntary commitment.   Level of care: Med-Surg       MDM: The below labs and imaging reports were  reviewed and summarized above.  Medication management as above.    DVT prophylaxis: enoxaparin (LOVENOX) injection 40 mg Start: 07/23/20 1800 SCDs Start: 07/22/20 1802  Code Status: FULL Family Communication: None present    Consultants:   Psychiatry  Procedures:   3/9 CT renal -- gallstones, fibroids, otherwise unremarkable  3/11 US abdomen -- steatosis  3/16 ECT  Antimicrobials:   Vancomycin, flagyl and cefepime 3/9 to 3/11  Ceftriaxone 3/11 to 3/14   Culture data:   3/9 blood cultures x2 -- ng  3/9 urine culture - lactobacillus  3/11 blood cultures no growth           Subjective: Much more alert, good appetite, no headache, chest pain, dyspnea.  Still has some periumbilical pain, thinks maybe some dysuria, but no fever or urinary frequency or urgency.  Objective: Vitals:   07/31/20 0128 07/31/20 0428 07/31/20 0742 07/31/20 1551  BP: 126/77 126/85 118/77 (!) 97/52  Pulse: 76 71 78 79  Resp: 16 18 13 18   Temp: 98.1 F (36.7 C) (!) 97.4 F (36.3 C) 97.7 F (36.5 C) 98.1 F (36.7 C)  TempSrc: Oral Oral    SpO2: 100% 99% 97% 100%  Weight:      Height:        Intake/Output Summary (Last 24 hours) at 07/31/2020 1622 Last data filed at  07/31/2020 1430 Gross per 24 hour  Intake 200 ml  Output -  Net 200 ml   Filed Weights   07/25/20 0003 07/27/20 0100 07/27/20 1047  Weight: 88.1 kg 89.6 kg 89.6 kg    Examination: General appearance: Adult female, lying in bed, no acute distress     HEENT:   Skin:  Cardiac: RRR, no murmurs, no lower extremity edema Respiratory: Normal respiratory rate and rhythm, lungs clear without rales or wheeze Abdomen: Abdomen soft no tenderness palpation or guarding, she describes some periumbilical pain, but does not have any guarding MSK:  Neuro:    Psych: Attention normal, affect constrained, judgment and insight appear improving.     Data Reviewed: I have personally reviewed following labs and imaging  studies:  CBC: Recent Labs  Lab 07/25/20 0353 07/26/20 0618 07/27/20 0413 07/31/20 0548  WBC 13.6* 19.3* 12.4* 9.6  HGB 14.0 15.4* 13.3 14.2  HCT 43.0 47.1* 40.7 41.4  MCV 96.0 95.5 96.0 92.4  PLT 275 283 210 423   Basic Metabolic Panel: Recent Labs  Lab 07/26/20 0618 07/27/20 0413 07/28/20 0536 07/29/20 0510 07/31/20 0548  NA 145 144 143 144 138  K 3.4* 3.0* 3.2* 3.3* 3.9  CL 113* 112* 111 110 105  CO2 19* 26 25 26 25   GLUCOSE 137* 105* 95 84 79  BUN 31* 15 11 19 12   CREATININE 1.15* 0.58 0.55 0.46 0.43*  CALCIUM 9.5 8.4* 8.6* 8.8* 8.7*   GFR: Estimated Creatinine Clearance: 95.3 mL/min (A) (by C-G formula based on SCr of 0.43 mg/dL (L)). Liver Function Tests: Recent Labs  Lab 07/25/20 0353  AST 25  ALT 30  ALKPHOS 56  BILITOT 1.1  PROT 6.7  ALBUMIN 3.4*   No results for input(s): LIPASE, AMYLASE in the last 168 hours. No results for input(s): AMMONIA in the last 168 hours. Coagulation Profile: No results for input(s): INR, PROTIME in the last 168 hours. Cardiac Enzymes: No results for input(s): CKTOTAL, CKMB, CKMBINDEX, TROPONINI in the last 168 hours. BNP (last 3 results) No results for input(s): PROBNP in the last 8760 hours. HbA1C: No results for input(s): HGBA1C in the last 72 hours. CBG: Recent Labs  Lab 07/29/20 0440 07/29/20 1643 07/30/20 0822 07/30/20 1330 07/30/20 1518  GLUCAP 76 93 79 93 94   Lipid Profile: No results for input(s): CHOL, HDL, LDLCALC, TRIG, CHOLHDL, LDLDIRECT in the last 72 hours. Thyroid Function Tests: No results for input(s): TSH, T4TOTAL, FREET4, T3FREE, THYROIDAB in the last 72 hours. Anemia Panel: No results for input(s): VITAMINB12, FOLATE, FERRITIN, TIBC, IRON, RETICCTPCT in the last 72 hours. Urine analysis:    Component Value Date/Time   COLORURINE YELLOW (A) 07/30/2020 1544   APPEARANCEUR HAZY (A) 07/30/2020 1544   LABSPEC 1.025 07/30/2020 1544   PHURINE 6.0 07/30/2020 1544   GLUCOSEU NEGATIVE  07/30/2020 1544   HGBUR NEGATIVE 07/30/2020 1544   BILIRUBINUR NEGATIVE 07/30/2020 1544   KETONESUR 80 (A) 07/30/2020 1544   PROTEINUR NEGATIVE 07/30/2020 1544   NITRITE NEGATIVE 07/30/2020 1544   LEUKOCYTESUR NEGATIVE 07/30/2020 1544   Sepsis Labs: @LABRCNTIP (procalcitonin:4,lacticacidven:4)  ) Recent Results (from the past 240 hour(s))  CULTURE, BLOOD (ROUTINE X 2) w Reflex to ID Panel     Status: None   Collection Time: 07/22/20  6:22 PM   Specimen: BLOOD  Result Value Ref Range Status   Specimen Description BLOOD BLOOD LEFT HAND  Final   Special Requests   Final    BOTTLES DRAWN AEROBIC AND ANAEROBIC Blood  Culture adequate volume   Culture   Final    NO GROWTH 5 DAYS Performed at [facility], [address] [place], [place] [address]    Report Status 07/27/2020 FINAL  Final  CULTURE, BLOOD (ROUTINE X 2) w Reflex to ID Panel     Status: None   Collection Time: 07/22/20  6:23 PM   Specimen: BLOOD  Result Value Ref Range Status   Specimen Description BLOOD RIGHT ANTECUBITAL  Final   Special Requests   Final    BOTTLES DRAWN AEROBIC AND ANAEROBIC Blood Culture adequate volume   Culture   Final    NO GROWTH 5 DAYS Performed at [facility], [address], [place], [place] [address]    Report Status 07/27/2020 FINAL  Final  Urine Culture     Status: Abnormal   Collection Time: 07/22/20  6:30 PM   Specimen: Urine, Random  Result Value Ref Range Status   Specimen Description   Final    URINE, RANDOM Performed at [facility], [address], [place], [place] [address]    Special Requests   Final    NONE Performed at [facility], [address] [place], [place] [address]    Culture (A)  Final    20,000 COLONIES/mL LACTOBACILLUS SPECIES Standardized susceptibility testing for this organism is not available. Performed at [facility], [place] [address] [place], [place] [address]    Report Status 07/24/2020 FINAL  Final   CULTURE, BLOOD (ROUTINE X 2) w Reflex to ID Panel     Status: None   Collection Time: 07/24/20  9:19 AM   Specimen: BLOOD  Result Value Ref Range Status   Specimen Description BLOOD BLOOD LEFT HAND  Final   Special Requests   Final    BOTTLES DRAWN AEROBIC AND ANAEROBIC Blood Culture adequate volume   Culture   Final    NO GROWTH 5 DAYS Performed at [facility], [address] [place], [place] [address]    Report Status 07/29/2020 FINAL  Final  CULTURE, BLOOD (ROUTINE X 2) w Reflex to ID Panel     Status: None   Collection Time: 07/24/20  9:19 AM   Specimen: BLOOD  Result Value Ref Range Status   Specimen Description BLOOD BLOOD RIGHT HAND  Final   Special Requests   Final    BOTTLES DRAWN AEROBIC AND ANAEROBIC Blood Culture adequate volume   Culture   Final    NO GROWTH 5 DAYS Performed at [facility], [address] [place], [place] [address]    Report Status 07/29/2020 FINAL  Final         Radiology Studies: No results found.      Scheduled Meds: . cloNIDine  0.2 mg Transdermal Weekly  . enoxaparin (LOVENOX) injection  40 mg Subcutaneous QPM  . glycopyrrolate  0.1 mg Intravenous Once  . nicotine  21 mg Transdermal Daily  . polyethylene glycol  17 g Oral BID  . senna  1 tablet Oral Daily   Continuous Infusions: . sodium chloride       LOS: 9 days    Time spent: 25 minutes    Edwin Dada, MD [facility] 07/31/2020, 4:22 PM     Please page though [place] or Epic secure chat:  For [employer], Adult nurse
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
## 2020-07-31 NOTE — Procedures (Signed)
ECT SERVICES Physician's Interval Evaluation & Treatment Note  Patient Identification: Isabella [name] MRN:  [id number] Date of Evaluation:  07/31/2020 TX #: [id number]  MADRS:   MMSE:   P.E. Findings:  Physical exam stronger cleaner better hygiene  Psychiatric Interval Note:  More alert and interactive  Subjective:  [name] seen for evaluation for Electroconvulsive Therapy. Aware of feeling better  Treatment Summary:   []   Right Unilateral             [x]  Bilateral   % Energy : 1.0 ms 60%   Impedance: 2590 ohms  Seizure Energy Index: 33,786 V squared  Postictal Suppression Index: No appreciable reading  Seizure [place] Index: 98%  Medications  Pre Shock: Robinul 0.1 mg Toradol 30 mg Brevital 80 mg succinylcholine 100 mg  Post Shock:    Seizure Duration: 32 seconds EMG 33 seconds EEG   Comments: Increase dose of Robinul next treatment  Lungs:  [x]   Clear to auscultation               []  Other:   Heart:    [x]   Regular rhythm             []  irregular rhythm    [x]   Previous H&P reviewed, patient examined and there are NO CHANGES                 []   Previous H&P reviewed, patient examined and there are changes noted.   [name], MD 3/18/20227:14 PM

## 2020-07-31 NOTE — Anesthesia Preprocedure Evaluation (Signed)
Anesthesia Evaluation  Patient identified by MRN, date of birth, ID band Patient awake  General Assessment Comment:Hx reviewed with pt's mom, pt currently nonverbal  Reviewed: Allergy & Precautions, NPO status , Patient's Chart, lab work & pertinent test results, Unable to perform ROS - Chart review only  History of Anesthesia Complications Negative for: history of anesthetic complications  Airway Mallampati: II  TM Distance: >3 FB    Comment: Unable to assess, pt not cooperative with exam Dental  (+) Teeth Intact Unable to assess, pt not cooperative with exam:   Pulmonary neg sleep apnea, neg COPD, Current Smoker and Patient abstained from smoking.,    breath sounds clear to auscultation- rhonchi (-) wheezing      Cardiovascular hypertension, Pt. on medications (-) CAD, (-) Past MI, (-) Cardiac Stents and (-) CABG  Rhythm:Regular Rate:Normal - Systolic murmurs and - Diastolic murmurs    Neuro/Psych  Headaches, neg Seizures PSYCHIATRIC DISORDERS Anxiety Depression Bipolar Disorder Schizophrenia    GI/Hepatic negative GI ROS, Neg liver ROS,   Endo/Other  negative endocrine ROSneg diabetes  Renal/GU negative Renal ROS     Musculoskeletal negative musculoskeletal ROS (+)   Abdominal (+) - obese,   Peds  Hematology negative hematology ROS (+)   Anesthesia Other Findings Past Medical History: No date: Bipolar disorder ([place]) No date: Hypertension 07/13/2016: Migraine No date: Schizophrenia [facility])   Reproductive/Obstetrics                             Anesthesia Physical  Anesthesia Plan  ASA: II  Anesthesia Plan: General   Post-op Pain Management:    Induction: Intravenous  PONV Risk Score and Plan:   Airway Management Planned: Mask  Additional Equipment:   Intra-op Plan:   Post-operative Plan:   Informed Consent: I have reviewed the patients History and Physical, chart, labs and  discussed the procedure including the risks, benefits and alternatives for the proposed anesthesia with the patient or authorized representative who has indicated his/her understanding and acceptance.     Dental advisory given  Plan Discussed with: CRNA and Anesthesiologist  Anesthesia Plan Comments:         Anesthesia Quick Evaluation

## 2020-07-31 NOTE — Progress Notes (Signed)
Patient's BP is stable and Dr. Lubertha Basque is okay with taking the patient back to her room.

## 2020-08-01 LAB — URINE CULTURE: Culture: NO GROWTH

## 2020-08-01 MED ORDER — ROCURONIUM BROMIDE 10 MG/ML (PF) SYRINGE
PREFILLED_SYRINGE | INTRAVENOUS | Status: AC
  Filled 2020-08-01: qty 10

## 2020-08-01 MED ORDER — SUCCINYLCHOLINE CHLORIDE 200 MG/10ML IV SOSY
PREFILLED_SYRINGE | INTRAVENOUS | Status: AC
  Filled 2020-08-01: qty 10

## 2020-08-01 NOTE — Progress Notes (Signed)
[facility] PROGRESS NOTE    Isabella [name]  IHK:[id number] DOB: 1975/10/01 DOA: 07/22/2020 PCP: Patient, No Pcp Per      Brief Narrative:  Isabella [name] is a 45 y.o. F with schizoaffective disorder with recurrent catatonia, HTN, migraines, and smoking who presented from [facility] with fever.  Initially presented with 2-3 weeks catatonia, not speaking, moving, or taking anything by mouth.  Was admitted to [facility] and started on ECT, then developed a fever to 101F.  Admitted to the hospitalist service, CT renal study showed no evidence of stone or hydronephrosis.  WBC 17K, Cr 1.7 from baseline 0.8, HR 140s and TBili 1.8.    Interval: 3/9: admitted and started on antibiotics, CT without hydro or stone 3/10: liver US showed fatty liver 3/12: Fever resolved 3/14: completed 6 days cephalosporin, blood and urine cultures negative           Assessment & Plan:  Sepsis due to UTI Resolved    Hypokalemia Resolved with treatment  Acute kidney injury Resolved with fluids  Hypernatremia Resolved with fluids  Hypertension BP normal, not on meds at home   Smoking  Obesity BMI 33            Disposition: Status is: Inpatient  Remains inpatient appropriate because:Unsafe d/c plan   Dispo: The patient is from: [facility]              Anticipated d/c is to: [facility]              Patient currently is medically stable to d/c.   Difficult to place patient Yes    Patient was initially admitted to [facility] for catatonia.  While there, developed UTI with sepsis, transferred to medical floor.  She has now been treated for sepsis and is medically stable for transfer back to [facility].  Currently there are no beds. Psychiatry at present still feel she warrants inpatietn psychiatry treatment, and she is under voluntary commitment.   Level of care: Med-Surg       MDM: The below labs and imaging reports were reviewed and summarized above.  Medication management as  above.    DVT prophylaxis: enoxaparin (LOVENOX) injection 40 mg Start: 07/23/20 1800 SCDs Start: 07/22/20 1802  Code Status: FULL Family Communication: None present    Consultants:   Psychiatry  Procedures:   3/9 CT renal -- gallstones, fibroids, otherwise unremarkable  3/11 US abdomen -- steatosis  3/16 ECT  Antimicrobials:   Vancomycin, flagyl and cefepime 3/9 to 3/11  Ceftriaxone 3/11 to 3/14   Culture data:   3/9 blood cultures x2 -- ng  3/9 urine culture - lactobacillus  3/11 blood cultures no growth           Subjective: Much more alert, good appetite, no headache, chest pain, dyspnea.  Still has some periumbilical pain, thinks maybe some dysuria, but no fever or urinary frequency or urgency.  Objective: Vitals:   08/01/20 0005 08/01/20 0438 08/01/20 0735 08/01/20 1135  BP: (!) 96/54 95/61 114/77 105/71  Pulse: 79 68 79 77  Resp: 16 18 14 17   Temp: 98.6 F (37 C) 98.9 F (37.2 C) 97.8 F (36.6 C) 98.6 F (37 C)  TempSrc:      SpO2: 96% 97% 96% 92%  Weight:      Height:        Intake/Output Summary (Last 24 hours) at 08/01/2020 1441 Last data filed at 08/01/2020 1403 Gross per 24 hour  Intake 600 ml  Output 2740 ml  Net -2140 ml   Filed Weights   07/25/20 0003 07/27/20 0100 07/27/20 1047  Weight: 88.1 kg 89.6 kg 89.6 kg    Examination: General appearance: Adult female, lying in bed, no acute distress     HEENT:   Skin:  Cardiac: RRR, no murmurs, no lower extremity edema Respiratory: Normal respiratory rate and rhythm, lungs clear without rales or wheeze Abdomen: Abdomen soft no tenderness palpation or guarding, she describes some periumbilical pain, but does not have any guarding MSK:  Neuro:    Psych: Attention normal, affect constrained, judgment and insight appear improving.     Data Reviewed: I have personally reviewed following labs and imaging studies:  CBC: Recent Labs  Lab 07/26/20 0618 07/27/20 0413  07/31/20 0548  WBC 19.3* 12.4* 9.6  HGB 15.4* 13.3 14.2  HCT 47.1* 40.7 41.4  MCV 95.5 96.0 92.4  PLT 283 210 956   Basic Metabolic Panel: Recent Labs  Lab 07/26/20 0618 07/27/20 0413 07/28/20 0536 07/29/20 0510 07/31/20 0548  NA 145 144 143 144 138  K 3.4* 3.0* 3.2* 3.3* 3.9  CL 113* 112* 111 110 105  CO2 19* 26 25 26 25   GLUCOSE 137* 105* 95 84 79  BUN 31* 15 11 19 12   CREATININE 1.15* 0.58 0.55 0.46 0.43*  CALCIUM 9.5 8.4* 8.6* 8.8* 8.7*   GFR: Estimated Creatinine Clearance: 95.3 mL/min (A) (by C-G formula based on SCr of 0.43 mg/dL (L)). Liver Function Tests: No results for input(s): AST, ALT, ALKPHOS, BILITOT, PROT, ALBUMIN in the last 168 hours. No results for input(s): LIPASE, AMYLASE in the last 168 hours. No results for input(s): AMMONIA in the last 168 hours. Coagulation Profile: No results for input(s): INR, PROTIME in the last 168 hours. Cardiac Enzymes: No results for input(s): CKTOTAL, CKMB, CKMBINDEX, TROPONINI in the last 168 hours. BNP (last 3 results) No results for input(s): PROBNP in the last 8760 hours. HbA1C: No results for input(s): HGBA1C in the last 72 hours. CBG: Recent Labs  Lab 07/29/20 0440 07/29/20 1643 07/30/20 0822 07/30/20 1330 07/30/20 1518  GLUCAP 76 93 79 93 94   Lipid Profile: No results for input(s): CHOL, HDL, LDLCALC, TRIG, CHOLHDL, LDLDIRECT in the last 72 hours. Thyroid Function Tests: No results for input(s): TSH, T4TOTAL, FREET4, T3FREE, THYROIDAB in the last 72 hours. Anemia Panel: No results for input(s): VITAMINB12, FOLATE, FERRITIN, TIBC, IRON, RETICCTPCT in the last 72 hours. Urine analysis:    Component Value Date/Time   COLORURINE YELLOW (A) 07/30/2020 1544   APPEARANCEUR HAZY (A) 07/30/2020 1544   LABSPEC 1.025 07/30/2020 1544   PHURINE 6.0 07/30/2020 1544   GLUCOSEU NEGATIVE 07/30/2020 1544   HGBUR NEGATIVE 07/30/2020 1544   BILIRUBINUR NEGATIVE 07/30/2020 1544   KETONESUR 80 (A) 07/30/2020 1544    PROTEINUR NEGATIVE 07/30/2020 1544   NITRITE NEGATIVE 07/30/2020 1544   LEUKOCYTESUR NEGATIVE 07/30/2020 1544   Sepsis Labs: @LABRCNTIP (procalcitonin:4,lacticacidven:4)  ) Recent Results (from the past 240 hour(s))  CULTURE, BLOOD (ROUTINE X 2) w Reflex to ID Panel     Status: None   Collection Time: 07/22/20  6:22 PM   Specimen: BLOOD  Result Value Ref Range Status   Specimen Description BLOOD BLOOD LEFT HAND  Final   Special Requests   Final    BOTTLES DRAWN AEROBIC AND ANAEROBIC Blood Culture adequate volume   Culture   Final    NO GROWTH 5 DAYS Performed at [facility], [address], [place], [place] [address]  Report Status 07/27/2020 FINAL  Final  CULTURE, BLOOD (ROUTINE X 2) w Reflex to ID Panel     Status: None   Collection Time: 07/22/20  6:23 PM   Specimen: BLOOD  Result Value Ref Range Status   Specimen Description BLOOD RIGHT ANTECUBITAL  Final   Special Requests   Final    BOTTLES DRAWN AEROBIC AND ANAEROBIC Blood Culture adequate volume   Culture   Final    NO GROWTH 5 DAYS Performed at [facility], [address], [place], [place] [address]    Report Status 07/27/2020 FINAL  Final  Urine Culture     Status: Abnormal   Collection Time: 07/22/20  6:30 PM   Specimen: Urine, Random  Result Value Ref Range Status   Specimen Description   Final    URINE, RANDOM Performed at [facility], [address], [place], [place] [address]    Special Requests   Final    NONE Performed at [facility], [address] [place], [place] [address]    Culture (A)  Final    20,000 COLONIES/mL LACTOBACILLUS SPECIES Standardized susceptibility testing for this organism is not available. Performed at [facility], [place] [address] [place], [place] [address]    Report Status 07/24/2020 FINAL  Final  CULTURE, BLOOD (ROUTINE X 2) w Reflex to ID Panel     Status: None   Collection Time: 07/24/20  9:19 AM   Specimen: BLOOD   Result Value Ref Range Status   Specimen Description BLOOD BLOOD LEFT HAND  Final   Special Requests   Final    BOTTLES DRAWN AEROBIC AND ANAEROBIC Blood Culture adequate volume   Culture   Final    NO GROWTH 5 DAYS Performed at [facility], [place] [address] [place], [place] [address]    Report Status 07/29/2020 FINAL  Final  CULTURE, BLOOD (ROUTINE X 2) w Reflex to ID Panel     Status: None   Collection Time: 07/24/20  9:19 AM   Specimen: BLOOD  Result Value Ref Range Status   Specimen Description BLOOD BLOOD RIGHT HAND  Final   Special Requests   Final    BOTTLES DRAWN AEROBIC AND ANAEROBIC Blood Culture adequate volume   Culture   Final    NO GROWTH 5 DAYS Performed at [facility], [address] [place], [place] [address]    Report Status 07/29/2020 FINAL  Final  Urine Culture     Status: None   Collection Time: 07/30/20  3:44 PM   Specimen: Urine, Random  Result Value Ref Range Status   Specimen Description   Final    URINE, RANDOM Performed at [facility], [address] [place], [place] [address]    Special Requests   Final    NONE Performed at [facility], [address] [place], [place] [address]    Culture   Final    NO GROWTH Performed at [facility], [place] [address], [place], [place] [address]    Report Status 08/01/2020 FINAL  Final         Radiology Studies: No results found.      Scheduled Meds: . enoxaparin (LOVENOX) injection  40 mg Subcutaneous QPM  . glycopyrrolate  0.1 mg Intravenous Once  . nicotine  21 mg Transdermal Daily  . polyethylene glycol  17 g Oral BID  . senna  1 tablet Oral Daily   Continuous Infusions: . sodium chloride  LOS: 10 days    Time spent: 25 minutes    Edwin Dada, MD [facility] 08/01/2020, 2:41 PM     Please page though [place] or Epic secure chat:  For [employer], Adult nurse

## 2020-08-02 NOTE — Anesthesia Postprocedure Evaluation (Signed)
Anesthesia Post Note  Patient: Isabella [name]  Procedure(s) Performed: ECT TX  Patient location during evaluation: PACU Anesthesia Type: General Level of consciousness: awake and alert Pain management: pain level controlled Vital Signs Assessment: post-procedure vital signs reviewed and stable Respiratory status: spontaneous breathing, nonlabored ventilation and respiratory function stable Cardiovascular status: blood pressure returned to baseline and stable Postop Assessment: no apparent nausea or vomiting Anesthetic complications: no   No complications documented.   Last Vitals:  Vitals:   07/31/20 1420 07/31/20 1430  BP: 98/64 106/60  Pulse: 77 77  Resp: (!) 8 18  Temp:  37.2 C  SpO2: 94% 95%    Last Pain:  Vitals:   07/31/20 1545  TempSrc:   PainSc: 0-No pain                 Brett Canales Joelyn Lover

## 2020-08-02 NOTE — Plan of Care (Signed)
No acute changes this shift, all concerns addressed, continue with POC.

## 2020-08-02 NOTE — Progress Notes (Signed)
[facility] PROGRESS NOTE    Isabella [name]  QQP:[id number] DOB: 1975-10-14 DOA: 07/22/2020 PCP: Patient, No Pcp Per      Brief Narrative:  Isabella [name] is a 45 y.o. F with schizoaffective disorder with recurrent catatonia, HTN, migraines, and smoking who presented from [facility] with fever.  Initially presented with 2-3 weeks catatonia, not speaking, moving, or taking anything by mouth.  Was admitted to [facility] and started on ECT, then developed a fever to 101F.  Admitted to the hospitalist service, CT renal study showed no evidence of stone or hydronephrosis.  WBC 17K, Cr 1.7 from baseline 0.8, HR 140s and TBili 1.8.    Interval: 3/9: admitted and started on antibiotics, CT without hydro or stone 3/10: liver US showed fatty liver 3/12: Fever resolved 3/14: completed 6 days cephalosporin, blood and urine cultures negative           Assessment & Plan:  Sepsis due to UTI Resolved  Depression with catatonia -Consult Psychiatry for ECT  Hypokalemia Resolved with treatment  Acute kidney injury Resolved with fluids  Hypernatremia Resolved with fluids  Hypertension BP normal, not on meds at home   Smoking  Obesity BMI 33            Disposition: Status is: Inpatient  Remains inpatient appropriate because:Unsafe d/c plan   Dispo: The patient is from: [facility]              Anticipated d/c is to: [facility]              Patient currently is medically stable to d/c.   Difficult to place patient Yes    Patient was initially admitted to [facility] for catatonia.  While there, developed UTI with sepsis, transferred to medical floor.  She has now been treated for sepsis and is medically stable for transfer back to [facility].  Currently there are no beds. Psychiatry at present still feel she warrants inpatietn psychiatry treatment, and she is under voluntary commitment.   Level of care: Med-Surg       MDM: The below labs and imaging reports were  reviewed and summarized above.  Medication management as above.    DVT prophylaxis: enoxaparin (LOVENOX) injection 40 mg Start: 07/23/20 1800 SCDs Start: 07/22/20 1802  Code Status: FULL Family Communication: brother and sister at bedside    Consultants:   Psychiatry  Procedures:   3/9 CT renal -- gallstones, fibroids, otherwise unremarkable  3/11 US abdomen -- steatosis  3/16 ECT  Antimicrobials:   Vancomycin, flagyl and cefepime 3/9 to 3/11  Ceftriaxone 3/11 to 3/14   Culture data:   3/9 blood cultures x2 -- ng  3/9 urine culture - lactobacillus  3/11 blood cultures no growth           Subjective: Much more alert, good appetite, no headache, chest pain, dyspnea.  Still has some periumbilical pain, thinks maybe some dysuria, but no fever or urinary frequency or urgency.  Objective: Vitals:   08/02/20 0429 08/02/20 0900 08/02/20 1256 08/02/20 1614  BP: 115/67 117/69 115/69 118/78  Pulse: 75 74 75 87  Resp: 16 16 17 17   Temp: 98.6 F (37 C) 98.4 F (36.9 C) 98.2 F (36.8 C) 98.7 F (37.1 C)  TempSrc: Oral Oral Oral   SpO2: 98% 98% 98% 95%  Weight:      Height:        Intake/Output Summary (Last 24 hours) at 08/02/2020 1629 Last data filed at 08/01/2020 1857 Gross  per 24 hour  Intake 240 ml  Output --  Net 240 ml   Filed Weights   07/25/20 0003 07/27/20 0100 07/27/20 1047  Weight: 88.1 kg 89.6 kg 89.6 kg    Examination: General appearance: adult female, sitting up in bed, family brushing her hair     HEENT:   Skin:  Cardiac: RRR, no murmurs, no lower extremity edema Respiratory: Normal respiratory rate and rhythm, lungs clear without rales or wheeze Abdomen: Abdomen soft no tenderness palpation or guarding, she describes some periumbilical pain, but does not have any guarding MSK:  Neuro:    Psych: Makes eye contact briefly, smiles, affect appears constrained.     Data Reviewed: I have personally reviewed following labs and imaging  studies:  CBC: Recent Labs  Lab 07/27/20 0413 07/31/20 0548  WBC 12.4* 9.6  HGB 13.3 14.2  HCT 40.7 41.4  MCV 96.0 92.4  PLT 210 277   Basic Metabolic Panel: Recent Labs  Lab 07/27/20 0413 07/28/20 0536 07/29/20 0510 07/31/20 0548  NA 144 143 144 138  K 3.0* 3.2* 3.3* 3.9  CL 112* 111 110 105  CO2 26 25 26 25   GLUCOSE 105* 95 84 79  BUN 15 11 19 12   CREATININE 0.58 0.55 0.46 0.43*  CALCIUM 8.4* 8.6* 8.8* 8.7*   GFR: Estimated Creatinine Clearance: 95.3 mL/min (A) (by C-G formula based on SCr of 0.43 mg/dL (L)). Liver Function Tests: No results for input(s): AST, ALT, ALKPHOS, BILITOT, PROT, ALBUMIN in the last 168 hours. No results for input(s): LIPASE, AMYLASE in the last 168 hours. No results for input(s): AMMONIA in the last 168 hours. Coagulation Profile: No results for input(s): INR, PROTIME in the last 168 hours. Cardiac Enzymes: No results for input(s): CKTOTAL, CKMB, CKMBINDEX, TROPONINI in the last 168 hours. BNP (last 3 results) No results for input(s): PROBNP in the last 8760 hours. HbA1C: No results for input(s): HGBA1C in the last 72 hours. CBG: Recent Labs  Lab 07/29/20 0440 07/29/20 1643 07/30/20 0822 07/30/20 1330 07/30/20 1518  GLUCAP 76 93 79 93 94   Lipid Profile: No results for input(s): CHOL, HDL, LDLCALC, TRIG, CHOLHDL, LDLDIRECT in the last 72 hours. Thyroid Function Tests: No results for input(s): TSH, T4TOTAL, FREET4, T3FREE, THYROIDAB in the last 72 hours. Anemia Panel: No results for input(s): VITAMINB12, FOLATE, FERRITIN, TIBC, IRON, RETICCTPCT in the last 72 hours. Urine analysis:    Component Value Date/Time   COLORURINE YELLOW (A) 07/30/2020 1544   APPEARANCEUR HAZY (A) 07/30/2020 1544   LABSPEC 1.025 07/30/2020 1544   PHURINE 6.0 07/30/2020 1544   GLUCOSEU NEGATIVE 07/30/2020 1544   HGBUR NEGATIVE 07/30/2020 1544   BILIRUBINUR NEGATIVE 07/30/2020 1544   KETONESUR 80 (A) 07/30/2020 1544   PROTEINUR NEGATIVE  07/30/2020 1544   NITRITE NEGATIVE 07/30/2020 1544   LEUKOCYTESUR NEGATIVE 07/30/2020 1544   Sepsis Labs: @LABRCNTIP (procalcitonin:4,lacticacidven:4)  ) Recent Results (from the past 240 hour(s))  CULTURE, BLOOD (ROUTINE X 2) w Reflex to ID Panel     Status: None   Collection Time: 07/24/20  9:19 AM   Specimen: BLOOD  Result Value Ref Range Status   Specimen Description BLOOD BLOOD LEFT HAND  Final   Special Requests   Final    BOTTLES DRAWN AEROBIC AND ANAEROBIC Blood Culture adequate volume   Culture   Final    NO GROWTH 5 DAYS Performed at [facility], [address] [place], [place] [address]    Report Status 07/29/2020 FINAL  Final  CULTURE, BLOOD (ROUTINE X 2) w Reflex to ID Panel     Status: None   Collection Time: 07/24/20  9:19 AM   Specimen: BLOOD  Result Value Ref Range Status   Specimen Description BLOOD BLOOD RIGHT HAND  Final   Special Requests   Final    BOTTLES DRAWN AEROBIC AND ANAEROBIC Blood Culture adequate volume   Culture   Final    NO GROWTH 5 DAYS Performed at [facility], [address] [place], [place] [address]    Report Status 07/29/2020 FINAL  Final  Urine Culture     Status: None   Collection Time: 07/30/20  3:44 PM   Specimen: Urine, Random  Result Value Ref Range Status   Specimen Description   Final    URINE, RANDOM Performed at [facility], [address], [place], [place] [address]    Special Requests   Final    NONE Performed at [facility], [address], [place], [place] [address]    Culture   Final    NO GROWTH Performed at [facility], [place] [address], [place], [place] [address]    Report Status 08/01/2020 FINAL  Final         Radiology Studies: No results found.      Scheduled Meds: . enoxaparin (LOVENOX) injection  40 mg Subcutaneous QPM  . glycopyrrolate  0.1 mg Intravenous Once  . nicotine  21 mg Transdermal Daily  . polyethylene glycol  17 g Oral BID  .  senna  1 tablet Oral Daily   Continuous Infusions: . sodium chloride       LOS: 11 days    Time spent: 15 minutes    Edwin Dada, MD [facility] 08/02/2020, 4:29 PM     Please page though [place] or Epic secure chat:  For [employer], Adult nurse

## 2020-08-03 ENCOUNTER — Inpatient Hospital Stay: Payer: Medicaid Other | Admitting: Registered Nurse

## 2020-08-03 ENCOUNTER — Other Ambulatory Visit: Payer: Self-pay | Admitting: Psychiatry

## 2020-08-03 ENCOUNTER — Inpatient Hospital Stay (HOSPITAL_COMMUNITY)
Admission: AD | Admit: 2020-08-03 | Discharge: 2020-08-03 | Disposition: A | Discharge status: Home / Self Care | Payer: Medicaid Other | Source: Ambulatory Visit | Attending: Family Medicine | Admitting: Family Medicine

## 2020-08-03 DIAGNOSIS — F061 Catatonic disorder due to known physiological condition: Secondary | ICD-10-CM | POA: Diagnosis not present

## 2020-08-03 LAB — GLUCOSE, CAPILLARY: Glucose-Capillary: 98 mg/dL (ref 70–99)

## 2020-08-03 MED ORDER — SODIUM CHLORIDE 0.9 % IV SOLN: 500.0000 mL | Freq: Once | INTRAVENOUS | Status: DC

## 2020-08-03 MED ORDER — METHOHEXITAL SODIUM 0.5 G IJ SOLR
INTRAMUSCULAR | Status: AC
  Filled 2020-08-03: qty 500

## 2020-08-03 MED ORDER — SUCCINYLCHOLINE CHLORIDE 200 MG/10ML IV SOSY
PREFILLED_SYRINGE | INTRAVENOUS | Status: AC
  Filled 2020-08-03: qty 10

## 2020-08-03 MED ORDER — KETOROLAC TROMETHAMINE 30 MG/ML IJ SOLN: 30.0000 mg | Freq: Once | INTRAMUSCULAR | Status: AC

## 2020-08-03 MED ORDER — GLYCOPYRROLATE 0.2 MG/ML IJ SOLN: 0.3000 mg | Freq: Once | INTRAMUSCULAR | Status: AC

## 2020-08-03 MED ORDER — LABETALOL HCL 5 MG/ML IV SOLN
INTRAVENOUS | Status: DC | PRN
  Administered 2020-08-03: 10 mg via INTRAVENOUS

## 2020-08-03 MED ORDER — METHOHEXITAL SODIUM 100 MG/10ML IV SOSY
PREFILLED_SYRINGE | INTRAVENOUS | Status: DC | PRN
  Administered 2020-08-03: 80 mg via INTRAVENOUS

## 2020-08-03 MED ORDER — SUCCINYLCHOLINE CHLORIDE 20 MG/ML IJ SOLN
INTRAMUSCULAR | Status: DC | PRN
  Administered 2020-08-03: 100 mg via INTRAVENOUS

## 2020-08-03 MED ORDER — KETOROLAC TROMETHAMINE 30 MG/ML IJ SOLN
INTRAMUSCULAR | Status: AC
  Administered 2020-08-03: 30 mg via INTRAVENOUS
  Filled 2020-08-03: qty 1

## 2020-08-03 MED ORDER — GLYCOPYRROLATE 0.2 MG/ML IJ SOLN
INTRAMUSCULAR | Status: AC
  Administered 2020-08-03: 0.3 mg via INTRAVENOUS
  Filled 2020-08-03: qty 2
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
## 2020-08-03 NOTE — Anesthesia Postprocedure Evaluation (Signed)
Anesthesia Post Note  Patient: Isabella [name]  Procedure(s) Performed: ECT TX  Patient location during evaluation: PACU Anesthesia Type: General Level of consciousness: awake and alert Pain management: pain level controlled Vital Signs Assessment: post-procedure vital signs reviewed and stable Respiratory status: spontaneous breathing, nonlabored ventilation, respiratory function stable and patient connected to nasal cannula oxygen Cardiovascular status: blood pressure returned to baseline and stable Postop Assessment: no apparent nausea or vomiting Anesthetic complications: no   No complications documented.   Last Vitals:  Vitals:   08/03/20 1300 08/03/20 1310  BP: 120/87 110/70  Pulse: (!) 101 99  Resp: 16 18  Temp:    SpO2: 97% 96%    Last Pain:  Vitals:   08/03/20 1310  TempSrc:   PainSc: 0-No pain                 Martha Clan

## 2020-08-03 NOTE — Anesthesia Preprocedure Evaluation (Signed)
Anesthesia Evaluation  Patient identified by MRN, date of birth, ID band Patient awake  General Assessment Comment:Hx reviewed with pt's mom, pt currently nonverbal  Reviewed: Allergy & Precautions, NPO status , Patient's Chart, lab work & pertinent test results, Unable to perform ROS - Chart review only  History of Anesthesia Complications Negative for: history of anesthetic complications  Airway Mallampati: II  TM Distance: >3 FB    Comment: Unable to assess, pt not cooperative with exam Dental  (+) Teeth Intact Unable to assess, pt not cooperative with exam:   Pulmonary neg sleep apnea, neg COPD, Current Smoker and Patient abstained from smoking.,    breath sounds clear to auscultation- rhonchi (-) wheezing      Cardiovascular hypertension, Pt. on medications (-) CAD, (-) Past MI, (-) Cardiac Stents and (-) CABG  Rhythm:Regular Rate:Normal - Systolic murmurs and - Diastolic murmurs    Neuro/Psych  Headaches, neg Seizures PSYCHIATRIC DISORDERS Anxiety Depression Bipolar Disorder Schizophrenia    GI/Hepatic negative GI ROS, Neg liver ROS,   Endo/Other  negative endocrine ROSneg diabetes  Renal/GU negative Renal ROS     Musculoskeletal negative musculoskeletal ROS (+)   Abdominal (+) - obese,   Peds  Hematology negative hematology ROS (+)   Anesthesia Other Findings Past Medical History: No date: Bipolar disorder ([place]) No date: Hypertension 07/13/2016: Migraine No date: Schizophrenia [facility])   Reproductive/Obstetrics                             Anesthesia Physical  Anesthesia Plan  ASA: II  Anesthesia Plan: General   Post-op Pain Management:    Induction: Intravenous  PONV Risk Score and Plan:   Airway Management Planned: Mask  Additional Equipment:   Intra-op Plan:   Post-operative Plan:   Informed Consent: I have reviewed the patients History and Physical, chart, labs and  discussed the procedure including the risks, benefits and alternatives for the proposed anesthesia with the patient or authorized representative who has indicated his/her understanding and acceptance.     Dental advisory given  Plan Discussed with: CRNA and Anesthesiologist  Anesthesia Plan Comments:         Anesthesia Quick Evaluation

## 2020-08-03 NOTE — Plan of Care (Signed)
Pt has rested during overnight. Complaint with meds. Chart indicated bs if diabetic or pre ect. No blood sugar obtained due to not meeting parameters. Will continue to monitor.  Problem: Education: Goal: Knowledge of General Education information will improve Description: Including pain rating scale, medication(s)/side effects and non-pharmacologic comfort measures Outcome: Progressing   Problem: Health Behavior/Discharge Planning: Goal: Ability to manage health-related needs will improve Outcome: Progressing   Problem: Clinical Measurements: Goal: Ability to maintain clinical measurements within normal limits will improve Outcome: Progressing Goal: Will remain free from infection Outcome: Progressing Goal: Diagnostic test results will improve Outcome: Progressing Goal: Respiratory complications will improve Outcome: Progressing Goal: Cardiovascular complication will be avoided Outcome: Progressing   Problem: Activity: Goal: Risk for activity intolerance will decrease Outcome: Progressing   Problem: Nutrition: Goal: Adequate nutrition will be maintained Outcome: Progressing Problem: Coping: Goal: Level of anxiety will decrease Outcome: Progressing   Problem: Elimination: Goal: Will not experience complications related to bowel motility Outcome: Progressing Goal: Will not experience complications related to urinary retention Outcome: Progressing   Problem: Pain Managment: Goal: General experience of comfort will improve Outcome: Progressing   Problem: Safety: Goal: Ability to remain free from injury will improve Outcome: Progressing   Problem: Skin Integrity: Goal: Risk for impaired skin integrity will decrease Outcome: Progressing

## 2020-08-03 NOTE — Progress Notes (Signed)
Physical Therapy Treatment Patient Details Name: Davine Sweney MRN: [id number] DOB: 07-19-1975 Today's Date: 08/03/2020    History of Present Illness 45 y.o. F with schizoaffective disorder with recurrent catatonia, HTN, migraines, and smoking.  Initially to behavioral med with 2-3 weeks catatonia, not speaking, moving, or taking anything by mouth.  Up to medical floor with fever.    PT Comments    Pt ready for session.  OOB and completed a lap around nursing unit with no AD.  Pt steady with gait today and has good confidence.  Assisted back to bed per her request and assisted with lunch order.   Follow Up Recommendations  Supervision - Intermittent     Equipment Recommendations  None recommended by PT    Recommendations for Other Services       Precautions / Restrictions Precautions Precautions: Fall    Mobility  Bed Mobility Overal bed mobility: Modified Independent                  Transfers Overall transfer level: Modified independent                  Ambulation/Gait Ambulation/Gait assistance: Supervision Gait Distance (Feet): 200 Feet Assistive device: None Gait Pattern/deviations: Step-through pattern Gait velocity: decreased   General Gait Details: generally steady with no AD today   Stairs             Wheelchair Mobility    Modified Rankin (Stroke Patients Only)       Balance Overall balance assessment: Modified Independent                                          Cognition Arousal/Alertness: Awake/alert Behavior During Therapy: WFL for tasks assessed/performed Overall Cognitive Status: Within Functional Limits for tasks assessed                                        Exercises      General Comments        Pertinent Vitals/Pain Pain Assessment: No/denies pain    Home Living                      Prior Function            PT Goals (current goals can now  be found in the care plan section) Progress towards PT goals: Progressing toward goals    Frequency    Min 2X/week      PT Plan      Co-evaluation              AM-PAC PT "6 Clicks" Mobility   Outcome Measure  Help needed turning from your back to your side while in a flat bed without using bedrails?: None Help needed moving from lying on your back to sitting on the side of a flat bed without using bedrails?: None Help needed moving to and from a bed to a chair (including a wheelchair)?: None Help needed standing up from a chair using your arms (e.g., wheelchair or bedside chair)?: None Help needed to walk in hospital room?: A Little Help needed climbing 3-5 steps with a railing? : A Little 6 Click Score: 22    End of Session Equipment Utilized During Treatment: Gait belt Activity Tolerance: Patient tolerated  treatment well Patient left: with chair alarm set;with call bell/phone within reach Nurse Communication: Mobility status PT Visit Diagnosis: Muscle weakness (generalized) (M62.81);Difficulty in walking, not elsewhere classified (R26.2)     Time: 9923-4144 PT Time Calculation (min) (ACUTE ONLY): 8 min  Charges:  $Gait Training: 8-22 mins                    [name], PTA 08/03/20, 2:08 PM

## 2020-08-03 NOTE — Anesthesia Procedure Notes (Signed)
Date/Time: 08/03/2020 12:35 PM Performed by: Doreen Salvage, CRNA Pre-anesthesia Checklist: Patient identified, Emergency Drugs available, Suction available and Patient being monitored Patient Re-evaluated:Patient Re-evaluated prior to induction Oxygen Delivery Method: Nasal cannula Induction Type: IV induction Dental Injury: Teeth and Oropharynx as per pre-operative assessment  Comments: Nasal cannula with etCO2 monitoring

## 2020-08-03 NOTE — Progress Notes (Signed)
[facility] PROGRESS NOTE    Isabella [name]  PNT:[id number] DOB: 1976/01/15 DOA: 07/22/2020 PCP: Patient, No Pcp Per      Brief Narrative:  Isabella [name] is a 45 y.o. F with schizoaffective disorder with recurrent catatonia, HTN, migraines, and smoking who presented from [facility] with fever.  Initially presented with 2-3 weeks catatonia, not speaking, moving, or taking anything by mouth.  Was admitted to [facility] and started on ECT, then developed a fever to 101F.  Admitted to the hospitalist service, CT renal study showed no evidence of stone or hydronephrosis.  WBC 17K, Cr 1.7 from baseline 0.8, HR 140s and TBili 1.8.    Interval: 3/9: admitted and started on antibiotics, CT without hydro or stone 3/10: liver US showed fatty liver 3/12: Fever resolved 3/14: completed 6 days cephalosporin, blood and urine cultures negative           Assessment & Plan:  Sepsis due to UTI Resolved  Depression with catatonia -Consult Psychiatry for ECT  Hypokalemia Resolved with treatment  Acute kidney injury Resolved with fluids  Hypernatremia Resolved with fluids  Hypertension BP normal, not on meds at home   Smoking  Obesity BMI 33            Disposition: Status is: Inpatient  Remains inpatient appropriate because:Unsafe d/c plan   Dispo: The patient is from: [facility]              Anticipated d/c is to: [facility]              Patient currently is medically stable to d/c.   Difficult to place patient Yes    Patient was initially admitted to [facility] for catatonia.  While there, developed UTI with sepsis, transferred to medical floor.  She has now been treated for sepsis and is medically stable for transfer back to [facility].  Currently there are no beds. Psychiatry at present still feel she warrants inpatietn psychiatry treatment, and she is under voluntary commitment.   Level of care: Med-Surg       MDM: The below labs and imaging reports were  reviewed and summarized above.  Medication management as above.    DVT prophylaxis: enoxaparin (LOVENOX) injection 40 mg Start: 07/23/20 1800 SCDs Start: 07/22/20 1802  Code Status: FULL Family Communication: brother and sister at bedside    Consultants:   Psychiatry  Procedures:   3/9 CT renal -- gallstones, fibroids, otherwise unremarkable  3/11 US abdomen -- steatosis  3/16 ECT  Antimicrobials:   Vancomycin, flagyl and cefepime 3/9 to 3/11  Ceftriaxone 3/11 to 3/14   Culture data:   3/9 blood cultures x2 -- ng  3/9 urine culture - lactobacillus  3/11 blood cultures no growth           Subjective: Much more alert, good appetite, no headache, chest pain, dyspnea.  Still has some periumbilical pain, thinks maybe some dysuria, but no fever or urinary frequency or urgency.  Objective: Vitals:   08/03/20 1334 08/03/20 1531 08/03/20 1532 08/03/20 1920  BP: 106/60 93/67 99/63  98/64  Pulse: 92 95 90 79  Resp:  16  16  Temp: 98 F (36.7 C) 98.5 F (36.9 C)  98.4 F (36.9 C)  TempSrc: Oral   Oral  SpO2: 94% 95% 96% 97%  Weight:      Height:        Intake/Output Summary (Last 24 hours) at 08/03/2020 1928 Last data filed at 08/03/2020 1255 Gross per 24 hour  Intake 200 ml  Output --  Net 200 ml   Filed Weights   07/25/20 0003 07/27/20 0100 07/27/20 1047  Weight: 88.1 kg 89.6 kg 89.6 kg    Examination: General appearance: adult female, sitting up in bed, family brushing her hair     HEENT:   Skin:  Cardiac: RRR, no murmurs, no lower extremity edema Respiratory: Normal respiratory rate and rhythm, lungs clear without rales or wheeze Abdomen: Abdomen soft no tenderness palpation or guarding, she describes some periumbilical pain, but does not have any guarding MSK:  Neuro:    Psych: Makes eye contact briefly, smiles, affect appears constrained.     Data Reviewed: I have personally reviewed following labs and imaging studies:  CBC: Recent  Labs  Lab August 24, 2020 0548  WBC 9.6  HGB 14.2  HCT 41.4  MCV 92.4  PLT 700   Basic Metabolic Panel: Recent Labs  Lab 07/28/20 0536 07/29/20 0510 08/24/2020 0548  NA 143 144 138  K 3.2* 3.3* 3.9  CL 111 110 105  CO2 25 26 25   GLUCOSE 95 84 79  BUN 11 19 12   CREATININE 0.55 0.46 0.43*  CALCIUM 8.6* 8.8* 8.7*   GFR: Estimated Creatinine Clearance: 95.3 mL/min (A) (by C-G formula based on SCr of 0.43 mg/dL (L)). Liver Function Tests: No results for input(s): AST, ALT, ALKPHOS, BILITOT, PROT, ALBUMIN in the last 168 hours. No results for input(s): LIPASE, AMYLASE in the last 168 hours. No results for input(s): AMMONIA in the last 168 hours. Coagulation Profile: No results for input(s): INR, PROTIME in the last 168 hours. Cardiac Enzymes: No results for input(s): CKTOTAL, CKMB, CKMBINDEX, TROPONINI in the last 168 hours. BNP (last 3 results) No results for input(s): PROBNP in the last 8760 hours. HbA1C: No results for input(s): HGBA1C in the last 72 hours. CBG: Recent Labs  Lab 07/29/20 1643 07/30/20 0822 07/30/20 1330 07/30/20 1518 08/01/20 0656  GLUCAP 93 79 93 94 98   Lipid Profile: No results for input(s): CHOL, HDL, LDLCALC, TRIG, CHOLHDL, LDLDIRECT in the last 72 hours. Thyroid Function Tests: No results for input(s): TSH, T4TOTAL, FREET4, T3FREE, THYROIDAB in the last 72 hours. Anemia Panel: No results for input(s): VITAMINB12, FOLATE, FERRITIN, TIBC, IRON, RETICCTPCT in the last 72 hours. Urine analysis:    Component Value Date/Time   COLORURINE YELLOW (A) 07/30/2020 1544   APPEARANCEUR HAZY (A) 07/30/2020 1544   LABSPEC 1.025 07/30/2020 1544   PHURINE 6.0 07/30/2020 1544   GLUCOSEU NEGATIVE 07/30/2020 1544   HGBUR NEGATIVE 07/30/2020 1544   BILIRUBINUR NEGATIVE 07/30/2020 1544   KETONESUR 80 (A) 07/30/2020 1544   PROTEINUR NEGATIVE 07/30/2020 1544   NITRITE NEGATIVE 07/30/2020 1544   LEUKOCYTESUR NEGATIVE 07/30/2020 1544   Sepsis  Labs: @LABRCNTIP (procalcitonin:4,lacticacidven:4)  ) Recent Results (from the past 240 hour(s))  Urine Culture     Status: None   Collection Time: 07/30/20  3:44 PM   Specimen: Urine, Random  Result Value Ref Range Status   Specimen Description   Final    URINE, RANDOM Performed at [facility], [address], [place], [place] [address]    Special Requests   Final    NONE Performed at [facility], [address] [place], [place] [address]    Culture   Final    NO GROWTH Performed at [facility], [place] [address] [place],  [address]    Report Status 08/01/2020 FINAL  Final         Radiology  Studies: No results found.      Scheduled Meds: . enoxaparin (LOVENOX) injection  40 mg Subcutaneous QPM  . glycopyrrolate  0.1 mg Intravenous Once  . nicotine  21 mg Transdermal Daily  . polyethylene glycol  17 g Oral BID  . senna  1 tablet Oral Daily   Continuous Infusions: . sodium chloride Stopped (08/03/20 1255)     LOS: 12 days    Time spent: 15 minutes    Edwin Dada, MD [facility] 08/03/2020, 7:28 PM     Please page though [place] or Epic secure chat:  For [employer], Adult nurse

## 2020-08-03 NOTE — Procedures (Signed)
ECT SERVICES Physician's Interval Evaluation & Treatment Note  Patient Identification: Isabella [name] MRN:  [id number] Date of Evaluation:  08/03/2020 TX #: [id number]  MADRS:   MMSE:   P.E. Findings:  Normal physical exam  Psychiatric Interval Note:  Back to being a little withdrawn although still able to speak and interact today  Subjective:  [name] seen for evaluation for Electroconvulsive Therapy. No specific complaints  Treatment Summary:   []   Right Unilateral             [x]  Bilateral   % Energy : 1.0 ms 60%   Impedance: 2330 ohms  Seizure Energy Index: 22,992 V squared  Postictal Suppression Index: 86%  Seizure Concordance Index: 98%  Medications  Pre Shock: Robinul 0.1 mg Toradol 30 mg Brevital 80 mg succinylcholine 100 mg  Post Shock:    Seizure Duration: 33 seconds EMG 42 seconds EEG   Comments: Likely still follow-up on Wednesday  Lungs:  [x]   Clear to auscultation               []  Other:   Heart:    [x]   Regular rhythm             []  irregular rhythm    [x]   Previous H&P reviewed, patient examined and there are NO CHANGES                 []   Previous H&P reviewed, patient examined and there are changes noted.   [name], MD 3/21/20228:26 PM

## 2020-08-03 NOTE — Transfer of Care (Signed)
Immediate Anesthesia Transfer of Care Note  Patient: Isabella [name]  Procedure(s) Performed: * No procedures listed *  Patient Location: PACU  Anesthesia Type:General  Level of Consciousness: sedated  Airway & Oxygen Therapy: Patient Spontanous Breathing and Patient connected to face mask oxygen  Post-op Assessment: Report given to RN and Post -op Vital signs reviewed and stable  Post vital signs: Reviewed and stable  Last Vitals:  Vitals:   08/03/20 1032 08/03/20 1254  BP: 99/68 (!) 137/99  Pulse: 78   Resp: 18 16  Temp: 37 C 36.9 C  SpO2: 96% 04%    Complications: No apparent anesthesia complications

## 2020-08-04 LAB — GLUCOSE, CAPILLARY: Glucose-Capillary: 105 mg/dL — ABNORMAL HIGH (ref 70–99)
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
## 2020-08-04 NOTE — Progress Notes (Signed)
[facility] PROGRESS NOTE    Isabella [name]  DDU:[id number] DOB: 1975/12/15 DOA: 07/22/2020 PCP: Patient, No Pcp Per      Brief Narrative:  Isabella [name] is a 45 y.o. F with schizoaffective disorder with recurrent catatonia, HTN, migraines, and smoking who presented from [facility] with fever.  Initially presented with 2-3 weeks catatonia, not speaking, moving, or taking anything by mouth.  Was admitted to [facility] and started on ECT, then developed a fever to 101F.  Admitted to the hospitalist service, CT renal study showed no evidence of stone or hydronephrosis.  WBC 17K, Cr 1.7 from baseline 0.8, HR 140s and TBili 1.8.    Interval: 3/6: admitted to [facility] with catatonia, plans for ECT 3/9: developed fever, transferred to medical floor and started on antibiotics, CT without hydro or stone 3/10: liver US showed fatty liver 3/12: Fever resolved 3/14: completed 6 days cephalosporin, blood and urine cultures negative 3/14: started ECT 3/16: ECT #2 3/18: ECT #3 3/21 ECT #4          Assessment & Plan:  Sepsis due to UTI Resolved  Depression with catatonia -Consult Psychiatry for ECT -Dispo per Psychiatry  Hypertension Chart history.  BP normal.  Not on meds at home.  Needed them transiently here, now off.     Hypokalemia Resolved with treatment  Acute kidney injury Resolved with fluids  Hypernatremia Resolved with fluids  Obesity BMI 33            Disposition: Status is: Inpatient  Remains inpatient appropriate because:Unsafe d/c plan   Dispo: The patient is from: [facility]              Anticipated d/c is to: [facility]              Patient currently is medically stable to d/c.   Difficult to place patient Yes    Patient was initially admitted to [facility] for catatonia.  While there, developed UTI with sepsis, transferred to medical floor.  She has now been treated for sepsis and is medically stable for transfer back to [facility].  Currently  there are no beds. Psychiatry at present still feel she warrants inpatietn psychiatry treatment, and she is under voluntary commitment.  Discharge to [facility] when bed available, or to home when patient ready.   Level of care: Med-Surg       MDM: The below labs and imaging reports were reviewed and summarized above.  Medication management as above.    DVT prophylaxis: enoxaparin (LOVENOX) injection 40 mg Start: 07/23/20 1800 SCDs Start: 07/22/20 1802  Code Status: FULL Family Communication: mother at bedside    Consultants:   Psychiatry  Procedures:   3/9 CT renal -- gallstones, fibroids, otherwise unremarkable  3/11 US abdomen -- steatosis   Antimicrobials:   Vancomycin, flagyl and cefepime 3/9 to 3/11  Ceftriaxone 3/11 to 3/14   Culture data:   3/9 blood cultures x2 -- ng  3/9 urine culture - lactobacillus  3/11 blood cultures no growth           Subjective: Feels good.  No fever, headache, abdominal pain, dyspnea, cough, urinary irritative symptoms.  Objective: Vitals:   08/04/20 0444 08/04/20 0848 08/04/20 1140 08/04/20 1607  BP: (!) 130/94 105/74 108/72 110/75  Pulse: 90 78 79 83  Resp: 17 16 16 18   Temp: 98.2 F (36.8 C) 97.8 F (36.6 C) 97.8 F (36.6 C) 97.8 F (36.6 C)  TempSrc:  Oral    SpO2:  94% 94% 96% 96%  Weight:      Height:       No intake or output data in the 24 hours ending 08/04/20 1722 Filed Weights   07/25/20 0003 07/27/20 0100 07/27/20 1047  Weight: 88.1 kg 89.6 kg 89.6 kg    Examination: General appearance: adult female sitting in bed, interactive, pleasant  HEENT:   Skin:  Cardiac:   Respiratory:   Abdomen:   MSK:  Neuro:    Psych: Attention normal.  Affect increasing in range, smiles.  Judgment and insight appear normal       Data Reviewed: I have personally reviewed following labs and imaging studies:  CBC: Recent Labs  Lab 07/31/20 0548  WBC 9.6  HGB 14.2  HCT 41.4  MCV 92.4  PLT 629    Basic Metabolic Panel: Recent Labs  Lab 07/29/20 0510 07/31/20 0548  NA 144 138  K 3.3* 3.9  CL 110 105  CO2 26 25  GLUCOSE 84 79  BUN 19 12  CREATININE 0.46 0.43*  CALCIUM 8.8* 8.7*   GFR: Estimated Creatinine Clearance: 95.3 mL/min (A) (by C-G formula based on SCr of 0.43 mg/dL (L)). Liver Function Tests: No results for input(s): AST, ALT, ALKPHOS, BILITOT, PROT, ALBUMIN in the last 168 hours. No results for input(s): LIPASE, AMYLASE in the last 168 hours. No results for input(s): AMMONIA in the last 168 hours. Coagulation Profile: No results for input(s): INR, PROTIME in the last 168 hours. Cardiac Enzymes: No results for input(s): CKTOTAL, CKMB, CKMBINDEX, TROPONINI in the last 168 hours. BNP (last 3 results) No results for input(s): PROBNP in the last 8760 hours. HbA1C: No results for input(s): HGBA1C in the last 72 hours. CBG: Recent Labs  Lab 07/30/20 0822 07/30/20 1330 07/30/20 1518 08/01/20 0656 08/04/20 0549  GLUCAP 79 93 94 98 105*   Lipid Profile: No results for input(s): CHOL, HDL, LDLCALC, TRIG, CHOLHDL, LDLDIRECT in the last 72 hours. Thyroid Function Tests: No results for input(s): TSH, T4TOTAL, FREET4, T3FREE, THYROIDAB in the last 72 hours. Anemia Panel: No results for input(s): VITAMINB12, FOLATE, FERRITIN, TIBC, IRON, RETICCTPCT in the last 72 hours. Urine analysis:    Component Value Date/Time   COLORURINE YELLOW (A) 07/30/2020 1544   APPEARANCEUR HAZY (A) 07/30/2020 1544   LABSPEC 1.025 07/30/2020 1544   PHURINE 6.0 07/30/2020 1544   GLUCOSEU NEGATIVE 07/30/2020 1544   HGBUR NEGATIVE 07/30/2020 1544   BILIRUBINUR NEGATIVE 07/30/2020 1544   KETONESUR 80 (A) 07/30/2020 1544   PROTEINUR NEGATIVE 07/30/2020 1544   NITRITE NEGATIVE 07/30/2020 1544   LEUKOCYTESUR NEGATIVE 07/30/2020 1544   Sepsis Labs: @LABRCNTIP (procalcitonin:4,lacticacidven:4)  ) Recent Results (from the past 240 hour(s))  Urine Culture     Status: None    Collection Time: 07/30/20  3:44 PM   Specimen: Urine, Random  Result Value Ref Range Status   Specimen Description   Final    URINE, RANDOM Performed at [facility], [address] [place], [place] [address]    Special Requests   Final    NONE Performed at [facility], [address] [place], [place] [address]    Culture   Final    NO GROWTH Performed at [facility], [place] [address] [place], [place] [address]    Report Status 08/01/2020 FINAL  Final         Radiology Studies: No results found.      Scheduled Meds: . enoxaparin (LOVENOX) injection  40 mg Subcutaneous QPM  .  glycopyrrolate  0.1 mg Intravenous Once  . nicotine  21 mg Transdermal Daily  . polyethylene glycol  17 g Oral BID  . senna  1 tablet Oral Daily   Continuous Infusions: . sodium chloride Stopped (08/03/20 1255)     LOS: 13 days    Time spent: 15 minutes    Edwin Dada, MD [facility] 08/04/2020, 5:22 PM     Please page though [place] or Epic secure chat:  For [employer], Adult nurse

## 2020-08-04 NOTE — Plan of Care (Signed)

## 2020-08-05 LAB — GLUCOSE, CAPILLARY
Glucose-Capillary: 105 mg/dL — ABNORMAL HIGH (ref 70–99)
Glucose-Capillary: 106 mg/dL — ABNORMAL HIGH (ref 70–99)

## 2020-08-05 MED ORDER — OLANZAPINE 10 MG PO TABS
10.0000 mg | ORAL_TABLET | Freq: Every day | ORAL | Status: DC
  Filled 2020-08-05: qty 1

## 2020-08-05 MED ORDER — FLUOXETINE HCL 20 MG PO CAPS
20.0000 mg | ORAL_CAPSULE | Freq: Every day | ORAL | Status: DC
  Filled 2020-08-05: qty 1

## 2020-08-05 MED ORDER — POLYETHYLENE GLYCOL 3350 17 G PO PACK: 17.0000 g | PACK | Freq: Two times a day (BID) | ORAL | 0 refills | Status: DC

## 2020-08-05 MED ORDER — OLANZAPINE 10 MG PO TABS: 10.0000 mg | ORAL_TABLET | Freq: Every day | ORAL | 4 refills | Status: DC

## 2020-08-05 MED ORDER — FLUOXETINE HCL 20 MG PO CAPS: 20.0000 mg | ORAL_CAPSULE | Freq: Every day | ORAL | 4 refills | Status: DC

## 2020-08-05 MED ORDER — NICOTINE 21 MG/24HR TD PT24
21.0000 mg | MEDICATED_PATCH | Freq: Every day | TRANSDERMAL | 0 refills | Status: DC
Start: 2020-08-06 — End: 2023-05-29

## 2020-08-05 NOTE — Progress Notes (Signed)
Patient leaves the unit in a w/c with volunteer to discharge lounge to wait for her mother to get here to pick her up.  AVS reviewed with patient.

## 2020-08-05 NOTE — Progress Notes (Signed)
Physical Therapy Treatment/Discharger Note [name] MRN: [id number] DOB: 1976/01/18 Today's Date: 08/05/2020    History of Present Illness 45 y.o. F with schizoaffective disorder with recurrent catatonia, HTN, migraines, and smoking.  Initially to behavioral med with 2-3 weeks catatonia, not speaking, moving, or taking anything by mouth.  Up to medical floor with fever.    PT Comments    Pt is able to get in and OOB with ease and walk on unit with no AD or balance/safety concerns.  She is hopeful or discharge home today.  Pt has met all PT goals at this time.  Will discharge from PT inpatient services.  Follow Up Recommendations  No PT follow up;Supervision - Intermittent     Equipment Recommendations  None recommended by PT    Recommendations for Other Services       Precautions / Restrictions Precautions Precautions: Fall Restrictions Weight Bearing Restrictions: No    Mobility  Bed Mobility Overal bed mobility: Independent                  Transfers Overall transfer level: Independent                  Ambulation/Gait Ambulation/Gait assistance: Independent   Assistive device: None Gait Pattern/deviations: WFL(Within Functional Limits) Gait velocity: WFL   General Gait Details: generally steady with no AD today   Stairs             Wheelchair Mobility    Modified Rankin (Stroke Patients Only)       Balance Overall balance assessment: Independent                                          Cognition Arousal/Alertness: Awake/alert Behavior During Therapy: WFL for tasks assessed/performed Overall Cognitive Status: Within Functional Limits for tasks assessed                                        Exercises      General Comments        Pertinent Vitals/Pain Pain Assessment: No/denies pain    Home Living                      Prior Function             PT Goals (current goals can now be found in the care plan section) Progress towards PT goals: Goals met/education completed, patient discharged from PT    Frequency    Min 2X/week      PT Plan Current plan remains appropriate    Co-evaluation              AM-PAC PT "6 Clicks" Mobility   Outcome Measure  Help needed turning from your back to your side while in a flat bed without using bedrails?: None Help needed moving from lying on your back to sitting on the side of a flat bed without using bedrails?: None Help needed moving to and from a bed to a chair (including a wheelchair)?: None Help needed standing up from a chair using your arms (e.g., wheelchair or bedside chair)?: None Help needed to walk in hospital room?: None Help needed climbing 3-5 steps with a railing? : A Little 6 Click Score: 23  End of Session   Activity Tolerance: Patient tolerated treatment well Patient left: in bed;with call bell/phone within reach Nurse Communication: Mobility status PT Visit Diagnosis: Muscle weakness (generalized) (M62.81);Difficulty in walking, not elsewhere classified (R26.2)     Time: 2297-9892 PT Time Calculation (min) (ACUTE ONLY): 8 min  Charges:  $Gait Training: 8-22 mins                    [name], PTA 08/05/20, 9:17 AM

## 2020-08-05 NOTE — Plan of Care (Signed)
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
## 2020-08-05 NOTE — Discharge Summary (Signed)
Physician Discharge Summary  Shaye Elling [facility] KXF:[id number] DOB: 22-Jul-1975 DOA: 07/22/2020  PCP: Patient, No Pcp Per  Admit date: 07/22/2020 Discharge date: 08/05/2020  Admitted From: Home  Disposition: Home   Recommendations for Outpatient Follow-up:  1. Follow up with PCP in 1-2 weeks 2. Please obtain BMP/CBC in one week, patient started on Zyprexa.  3. Routine screening for age. 4. Needs to follow up with Psych for further care of catatonia and depression.  5. Monitor BP. She had transient elevation BP in the hospital.  6.    Home Health: none  Discharge Condition: Stable. CODE STATUS: Full code Diet recommendation: Heart Healthy   Brief/Interim Summary: 45 year old with past medical history significant for schizoaffective disorder with recurrent catatonia, hypertension, migraine and smoking who presents from [facility] with fever.  Initially presented with 2 or 3 weeks of catatonia, not speaking, moving or taking any medications by mouth.  She was admitted to [facility] age and is started on ECT, then developed fever of 101.  She was admitted to the hospitalist service, CT renal study showed no evidence of a stone or hydronephrosis.  White blood cell 17 K, creatinine 1.7, creatinine baseline 0.8 heart rate 140 bilirubin 1.8.  Interval: 3/6: admitted to [facility] with catatonia, plans for ECT 3/9: developed fever, transferred to medical floor and started on antibiotics, CT without hydro or stone 3/10: liver US showed fatty liver 3/12: Fever resolved 3/14: completed 6 days cephalosporin, blood and urine cultures negative 3/14: started ECT 3/16: ECT #2 3/18: ECT #3 3/21 ECT #4   1-Sepsis due to UTI: Resolved. Patient presented with leukocytosis, tachycardia source of infection UTI  2-Depression with catatonia: Consulted psychiatry for ECT Patient has received 4 section of ECT.  She current had ECT today prior to discharge because she ate breakfast Okay to be discharged by psych.  Plan  to resume Prozac and Zyprexa at discharge.  Refill provided to the patient.  3-Hypertension: Transiently.  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[place])   LFTs abnormal   SIRS (systemic inflammatory response syndrome) ([facility])   HTN (hypertension)   AKI (acute kidney injury) ([place])   Tobacco abuse   Abnormal LFTs    Discharge Instructions  Discharge Instructions    Diet - low sodium heart healthy   Complete by: As directed    Increase activity slowly   Complete by: As directed      Allergies as of 08/05/2020   No Known Allergies     Medication List    STOP taking these medications   traZODone 100 MG tablet Commonly known as: DESYREL     TAKE these medications   FLUoxetine 20 MG capsule Commonly known as: PROZAC Take 1 capsule (20 mg total) by mouth daily.   nicotine 21 mg/24hr patch Commonly known as: NICODERM CQ - dosed in mg/24 hours Place 1 patch (21 mg total) onto the skin daily. Start taking on: August 06, 2020   OLANZapine 10 MG tablet Commonly known as: ZYPREXA Take 1 tablet (10 mg total) by mouth at bedtime. What changed:   medication strength  how much to take  when to take this   polyethylene glycol 17 g packet Commonly known as: MIRALAX / GLYCOLAX Take 17 g by mouth 2 (two) times daily.  No Known Allergies  Consultations;  Psych   Procedures/Studies: DG Chest Port 1 View  Result Date: 07/22/2020 CLINICAL DATA:  Abnormal LFTs EXAM: PORTABLE CHEST 1 VIEW COMPARISON:  None. FINDINGS: Cardiac shadow is at the upper limits of normal in size. The lungs are well aerated without focal infiltrate or effusion. No acute bony abnormality is noted. Old healed left clavicular fracture is noted. IMPRESSION: No acute  abnormality noted. Electronically Signed   By: Inez Catalina M.D.   On: 07/22/2020 19:19   CT RENAL STONE STUDY  Result Date: 07/22/2020 CLINICAL DATA:  Flank pain.  Kidney stone suspected. EXAM: CT ABDOMEN AND PELVIS WITHOUT CONTRAST TECHNIQUE: Multidetector CT imaging of the abdomen and pelvis was performed following the standard protocol without IV contrast. COMPARISON:  None. FINDINGS: Lower chest: Linear atelectasis within both lower lobes. No pleural fluid. Heart is normal in size. Hepatobiliary: Decreased hepatic density consistent with steatosis. There is no evidence of focal lesion on noncontrast exam. Layering density in the gallbladder suggesting sludge or small stones. Curvilinear calcification about the gallbladder fundus may be a large lamellated stone or gallbladder wall calcifications. No biliary dilatation. Focal low-density about the distal gallbladder fundus may represent a polyp but is incompletely characterize, series 2, image 36. Limited assessment for pericholecystic fat stranding due to motion. Pancreas: No acute finding allowing for motion. No definite peripancreatic fat stranding. Spleen: Normal in size without focal abnormality. Adrenals/Urinary Tract: Normal adrenal glands. No hydronephrosis or renal calculi. No significant perinephric edema. Ureters are decompressed without stones along the course. Unremarkable urinary bladder. No bladder stone. Stomach/Bowel: Small hiatal hernia. No small bowel obstruction or obvious inflammation. Normal appendix. Majority of the colon is decompressed. No obvious colonic wall thickening or pericolonic inflammation. Vascular/Lymphatic: Normal caliber abdominal aorta. Small retroperitoneal lymph nodes are not enlarged by size criteria. Reproductive: Enlarged uterus with multiple fibroids, including a dominant 8.7 x 8.5 cm about the left fundus. There is also a pedunculated fibroid extending into the right adnexa. The right ovary is seen separate from  this pedunculated fibroid. Left ovary is tentatively visualized and normal. There is air in the vagina. Other: No free air or ascites.  No significant body wall hernia. Musculoskeletal: There are no acute or suspicious osseous abnormalities. Scattered bone islands in the pelvis. IMPRESSION: 1. No renal stones or obstructive uropathy. 2. Layering density in the gallbladder suggesting sludge or small stones. Curvilinear calcification about the gallbladder fundus may be a large lamellated stone or gallbladder wall calcifications. Focal low-density about the distal gallbladder fundus may represent a polyp but is incompletely characterized. Recommend further evaluation with right upper quadrant ultrasound. 3. Enlarged uterus with multiple fibroids, including a dominant 8.7 cm fibroid about the left fundus. There is also a pedunculated fibroid extending into the right adnexa. 4. Hepatic steatosis. 5. Small hiatal hernia. 6. Motion limited exam. Electronically Signed   By: Keith Rake M.D.   On: 07/22/2020 22:58   US Abdomen Limited RUQ (LIVER/GB)  Result Date: 07/23/2020 CLINICAL DATA:  Elevated liver enzymes EXAM: ULTRASOUND ABDOMEN LIMITED RIGHT UPPER QUADRANT COMPARISON:  None. FINDINGS: Gallbladder: There are multiple echogenic foci within the gallbladder which move and shadow consistent with cholelithiasis. Largest gallstone measures 2.3 cm in length. Sludge is also noted in the gallbladder. The gallbladder wall is not overtly thickened. There is no pericholecystic fluid. No sonographic Murphy sign noted by sonographer. Common bile duct: Diameter: 4 mm. No intrahepatic or extrahepatic biliary duct dilatation. Liver: No focal lesion identified. Liver echogenicity  is increased diffusely. Portal vein is patent on color Doppler imaging with normal direction of blood flow towards the liver. Other: None. IMPRESSION: 1. Cholelithiasis and sludge in gallbladder. No appreciable gallbladder wall thickening or  pericholecystic fluid. 2. Increase in liver echogenicity is likely indicative of hepatic steatosis. No focal liver lesions evident. Note that the sensitivity of ultrasound for detection of focal liver lesions is diminished in this circumstance. Electronically Signed   By: Lowella Grip III M.D.   On: 07/23/2020 11:19      Subjective: She is alert, feels better. She was not taking her medication prior to admission. Agrees to take her meds.   Discharge Exam: Vitals:   08/05/20 0420 08/05/20 0759  BP: 112/69 114/78  Pulse: 75 88  Resp: 16 15  Temp: 97.7 F (36.5 C) 98.4 F (36.9 C)  SpO2: 99% 98%     General: Pt is alert, awake, not in acute distress Cardiovascular: RRR, S1/S2 +, no rubs, no gallops Respiratory: CTA bilaterally, no wheezing, no rhonchi Abdominal: Soft, NT, ND, bowel sounds + Extremities: no edema, no cyanosis    The results of significant diagnostics from this hospitalization (including imaging, microbiology, ancillary and laboratory) are listed below for reference.     Microbiology: Recent Results (from the past 240 hour(s))  Urine Culture     Status: None   Collection Time: 07/30/20  3:44 PM   Specimen: Urine, Random  Result Value Ref Range Status   Specimen Description   Final    URINE, RANDOM Performed at [facility], [address], [place], [place] [address]    Special Requests   Final    NONE Performed at [facility], [address], [place], [place] [address]    Culture   Final    NO GROWTH Performed at [facility], [place] [address], [place], [place] [address]    Report Status 08/01/2020 FINAL  Final     Labs: BNP (last 3 results) No results for input(s): BNP in the last 8760 hours. Basic Metabolic Panel: Recent Labs  Lab 07/31/20 0548  NA 138  K 3.9  CL 105  CO2 25  GLUCOSE 79  BUN 12  CREATININE 0.43*  CALCIUM 8.7*   Liver Function Tests: No results for input(s): AST, ALT, ALKPHOS, BILITOT,  PROT, ALBUMIN in the last 168 hours. No results for input(s): LIPASE, AMYLASE in the last 168 hours. No results for input(s): AMMONIA in the last 168 hours. CBC: Recent Labs  Lab 07/31/20 0548  WBC 9.6  HGB 14.2  HCT 41.4  MCV 92.4  PLT 258   Cardiac Enzymes: No results for input(s): CKTOTAL, CKMB, CKMBINDEX, TROPONINI in the last 168 hours. BNP: Invalid input(s): POCBNP CBG: Recent Labs  Lab 07/30/20 1330 07/30/20 1518 08/01/20 0656 08/04/20 0549 08/05/20 0801  GLUCAP 93 94 98 105* 105*   D-Dimer No results for input(s): DDIMER in the last 72 hours. Hgb A1c No results for input(s): HGBA1C in the last 72 hours. Lipid Profile No results for input(s): CHOL, HDL, LDLCALC, TRIG, CHOLHDL, LDLDIRECT in the last 72 hours. Thyroid function studies No results for input(s): TSH, T4TOTAL, T3FREE, THYROIDAB in the last 72 hours.  Invalid input(s): FREET3 Anemia work up No results for input(s): VITAMINB12, FOLATE, FERRITIN, TIBC, IRON, RETICCTPCT in the last 72 hours. Urinalysis    Component Value Date/Time   COLORURINE YELLOW (A) 07/30/2020 1544   APPEARANCEUR HAZY (A) 07/30/2020 1544   LABSPEC 1.025 07/30/2020 1544  PHURINE 6.0 07/30/2020 [place] 07/30/2020 1544   HGBUR NEGATIVE 07/30/2020 [place] 07/30/2020 1544   KETONESUR 80 (A) 07/30/2020 1544   PROTEINUR NEGATIVE 07/30/2020 1544   NITRITE NEGATIVE 07/30/2020 1544   LEUKOCYTESUR NEGATIVE 07/30/2020 1544   Sepsis Labs Invalid input(s): PROCALCITONIN,  WBC,  LACTICIDVEN Microbiology Recent Results (from the past 240 hour(s))  Urine Culture     Status: None   Collection Time: 07/30/20  3:44 PM   Specimen: Urine, Random  Result Value Ref Range Status   Specimen Description   Final    URINE, RANDOM Performed at [facility], [address] [place], [place] [address]    Special Requests   Final    NONE Performed at [facility], [address],  [place], [place] [address]    Culture   Final    NO GROWTH Performed at [facility], [place] [address] [place], [place] [address]    Report Status 08/01/2020 FINAL  Final     Time coordinating discharge: 40 minutes  SIGNED:   Elmarie Shiley, MD  [facility]

## 2020-08-07 ENCOUNTER — Encounter: Payer: Self-pay | Admitting: Certified Registered Nurse Anesthetist

## 2020-08-07 ENCOUNTER — Inpatient Hospital Stay: Admit: 2020-08-07 | Payer: Medicaid Other

## 2022-02-09 ENCOUNTER — Ambulatory Visit
Admission: RE | Admit: 2022-02-09 | Discharge: 2022-02-09 | Disposition: A | Discharge status: Home / Self Care | Payer: Medicaid Other | Source: Ambulatory Visit | Attending: Family Medicine | Admitting: Family Medicine

## 2022-02-09 ENCOUNTER — Other Ambulatory Visit: Payer: Self-pay | Admitting: Family Medicine

## 2022-02-09 DIAGNOSIS — Z1231 Encounter for screening mammogram for malignant neoplasm of breast: Secondary | ICD-10-CM

## 2022-11-19 IMAGING — CT CT RENAL STONE PROTOCOL
2 of 4 series · 15 of 46 positions shown, 17 images · non-contrast
Comparison: None.

CLINICAL DATA: Flank pain.  Kidney stone suspected.

EXAM:
CT ABDOMEN AND PELVIS WITHOUT CONTRAST
TECHNIQUE: Multidetector CT imaging of the abdomen and pelvis was performed
following the standard protocol without IV contrast.

[Series 2: stone full standard · axial · 0.89mm/px · z∈[-1328,-878]mm · 12 of 100 slices shown, 14 images]
[im 5/100  soft-tissue]
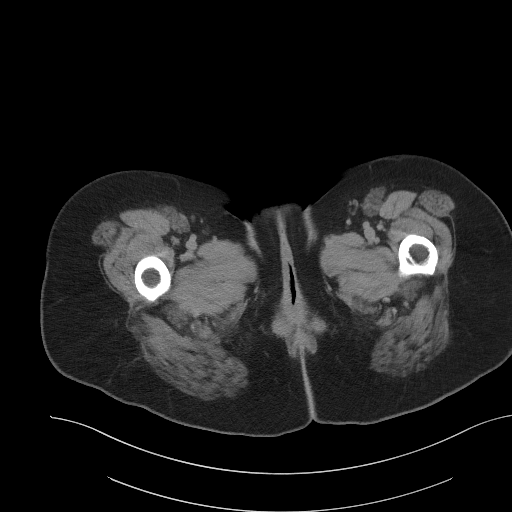
[im 5/100  bone]
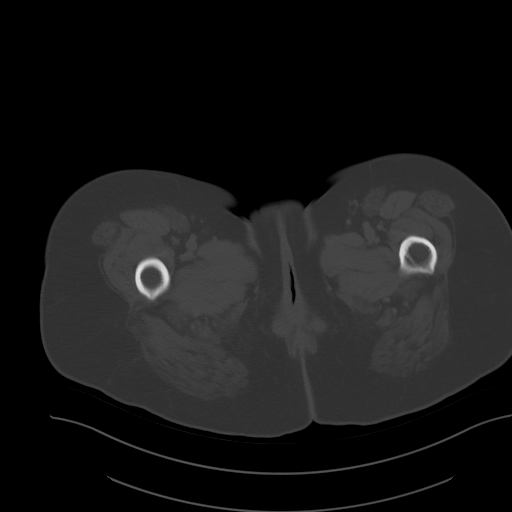
[im 14/100  soft-tissue]
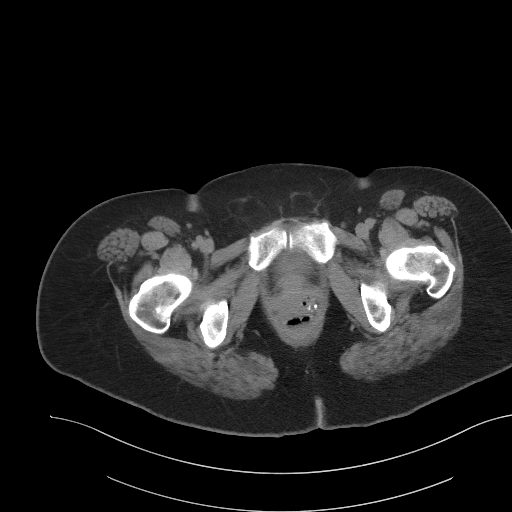
[im 23/100  soft-tissue]
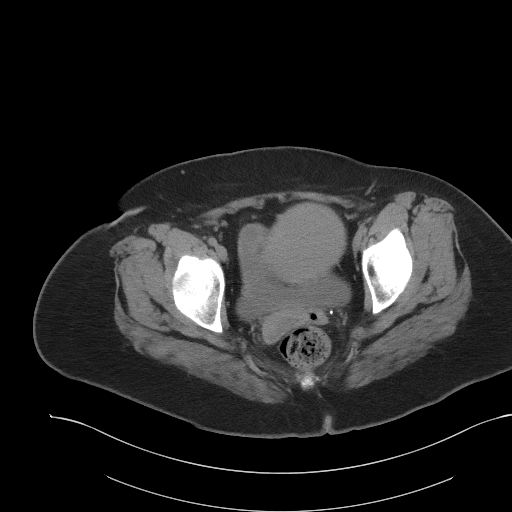
[im 32/100  soft-tissue]
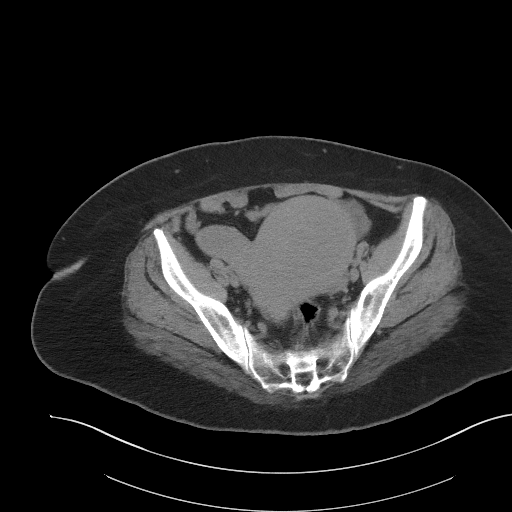
[im 37/100  soft-tissue]
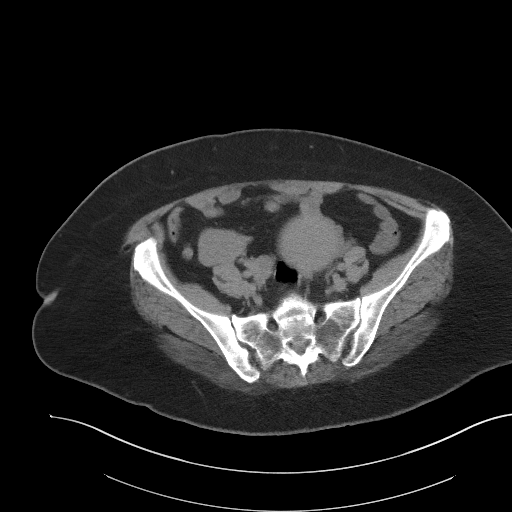
[im 46/100  soft-tissue]
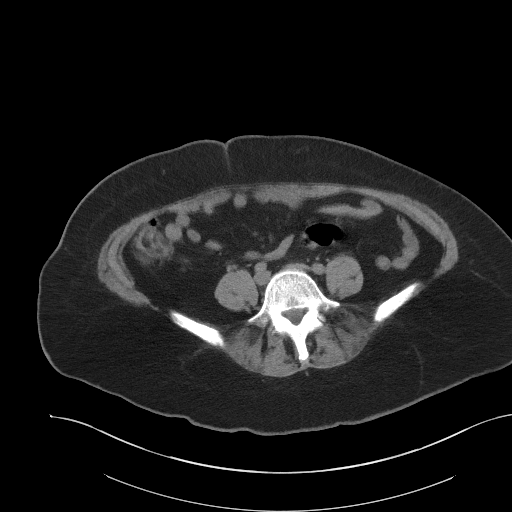
[im 55/100  soft-tissue]
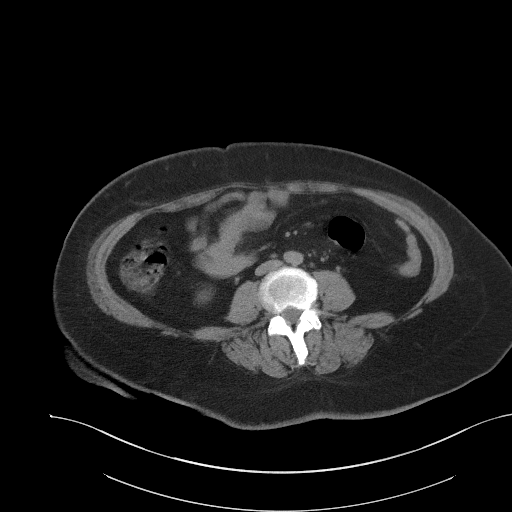
[im 64/100  soft-tissue]
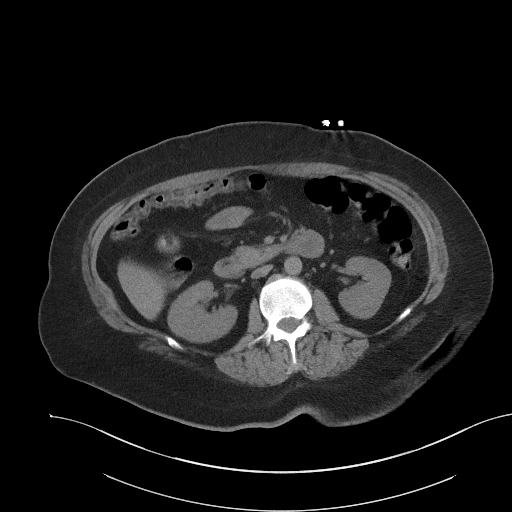
[im 68/100  soft-tissue]
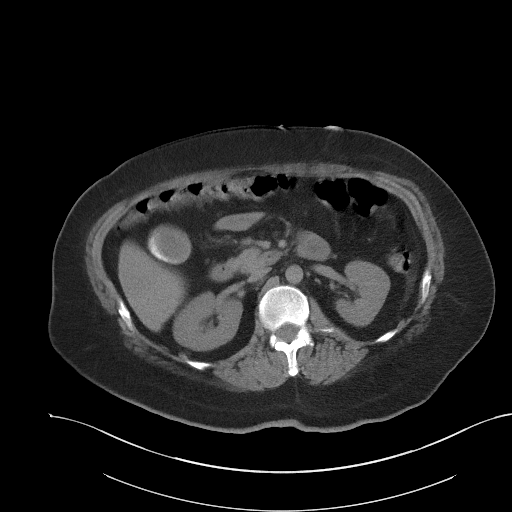
[im 68/100  bone]
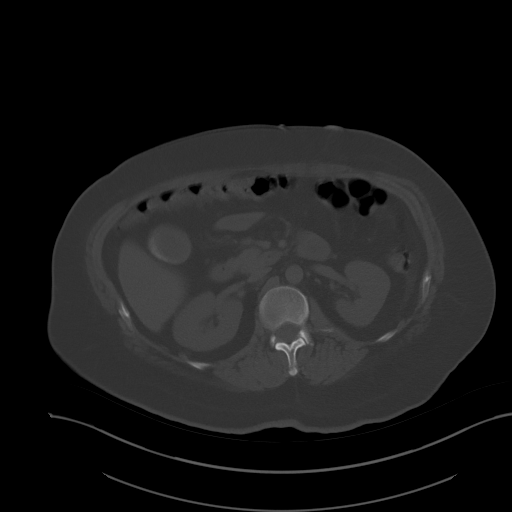
[im 77/100  soft-tissue]
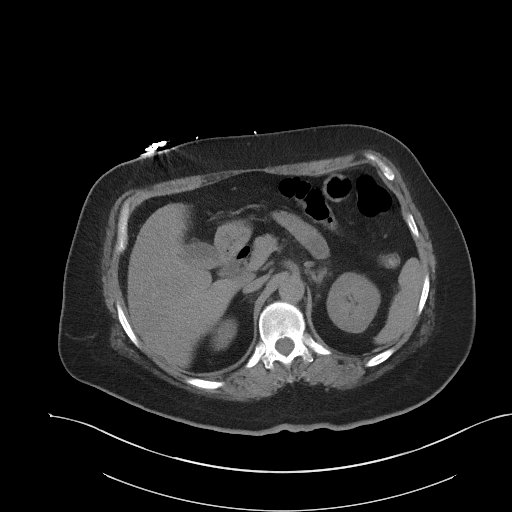
[im 86/100  soft-tissue]
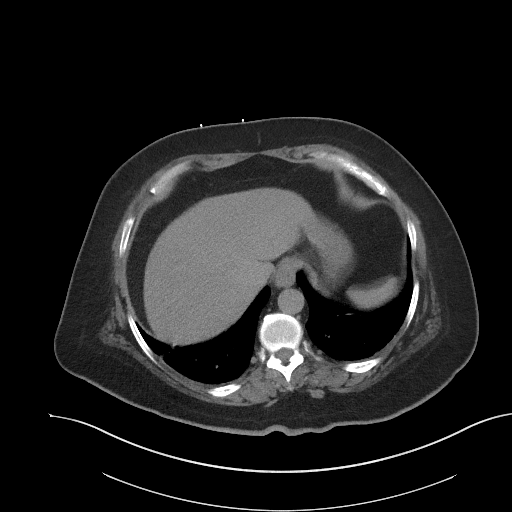
[im 95/100  soft-tissue]
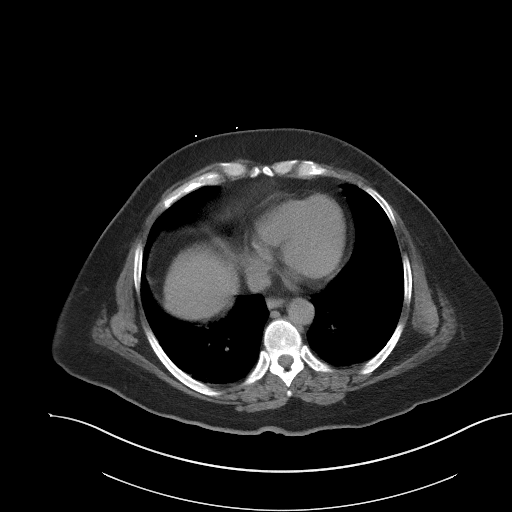

[Series 5: coronal · coronal · 0.74mm/px · 3 of 145 slices shown]
[im 49/145  soft-tissue]
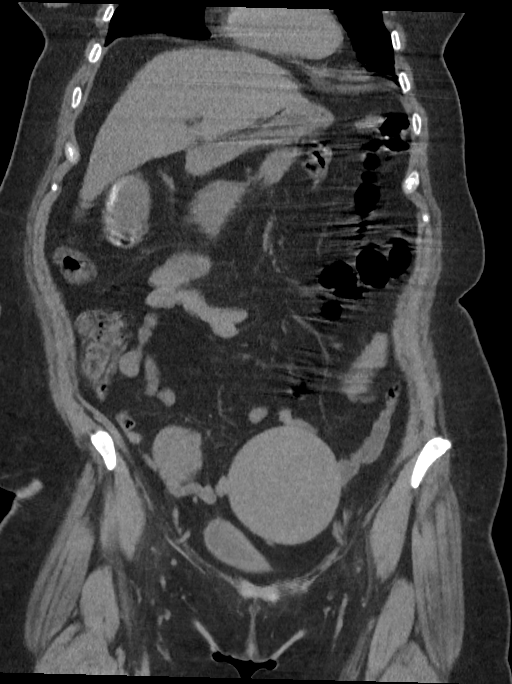
[im 65/145  soft-tissue]
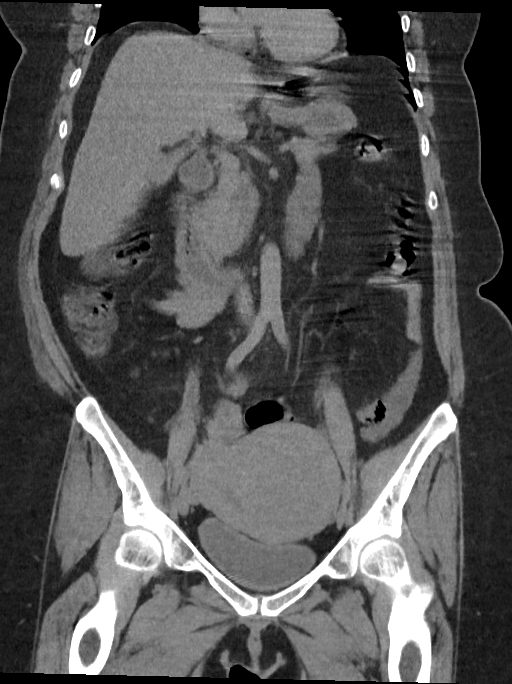
[im 81/145  soft-tissue]
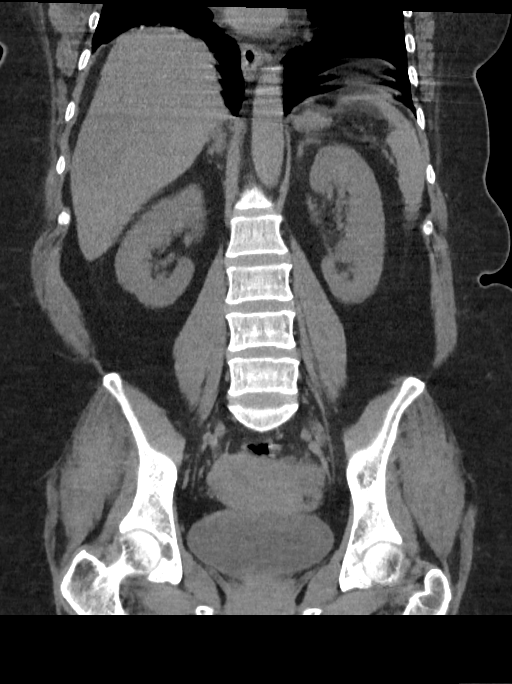

[15 of 46 positions shown; findings below may reference images not displayed]

FINDINGS: Lower chest: Linear atelectasis within both lower lobes. No pleural
fluid. Heart is normal in size.

Hepatobiliary: Decreased hepatic density consistent with steatosis.
There is no evidence of focal lesion on noncontrast exam. Layering
density in the gallbladder suggesting sludge or small stones.
Curvilinear calcification about the gallbladder fundus may be a
large lamellated stone or gallbladder wall calcifications. No
biliary dilatation. Focal low-density about the distal gallbladder
fundus may represent a polyp but is incompletely characterize,
series 2, image 36. Limited assessment for pericholecystic fat
stranding due to motion.

Pancreas: No acute finding allowing for motion. No definite
peripancreatic fat stranding.

Spleen: Normal in size without focal abnormality.

Adrenals/Urinary Tract: Normal adrenal glands. No hydronephrosis or
renal calculi. No significant perinephric edema. Ureters are
decompressed without stones along the course. Unremarkable urinary
bladder. No bladder stone.

Stomach/Bowel: Small hiatal hernia. No small bowel obstruction or
obvious inflammation. Normal appendix. Majority of the colon is
decompressed. No obvious colonic wall thickening or pericolonic
inflammation.

Vascular/Lymphatic: Normal caliber abdominal aorta. Small
retroperitoneal lymph nodes are not enlarged by size criteria.

Reproductive: Enlarged uterus with multiple fibroids, including a
dominant 8.7 x 8.5 cm about the left fundus. There is also a
pedunculated fibroid extending into the right adnexa. The right
ovary is seen separate from this pedunculated fibroid. Left ovary is
tentatively visualized and normal. There is air in the vagina.

Other: No free air or ascites.  No significant body wall hernia.

Musculoskeletal: There are no acute or suspicious osseous
abnormalities. Scattered bone islands in the pelvis.
IMPRESSION: 1. No renal stones or obstructive uropathy.
2. Layering density in the gallbladder suggesting sludge or small
stones. Curvilinear calcification about the gallbladder fundus may
be a large lamellated stone or gallbladder wall calcifications.
Focal low-density about the distal gallbladder fundus may represent
a polyp but is incompletely characterized. Recommend further
evaluation with right upper quadrant ultrasound.
3. Enlarged uterus with multiple fibroids, including a dominant
cm fibroid about the left fundus. There is also a pedunculated
fibroid extending into the right adnexa.
4. Hepatic steatosis.
5. Small hiatal hernia.
6. Motion limited exam.

## 2022-11-20 IMAGING — US US ABDOMEN LIMITED RUQ/ASCITES
1 series · 14 of 25 positions shown · non-contrast
Comparison: None.

CLINICAL DATA: Elevated liver enzymes

EXAM:
ULTRASOUND ABDOMEN LIMITED RIGHT UPPER QUADRANT

[Series 1: us abdomen limited ruq (liver/gb) · 14 of 56 slices shown]
[im 1/56]
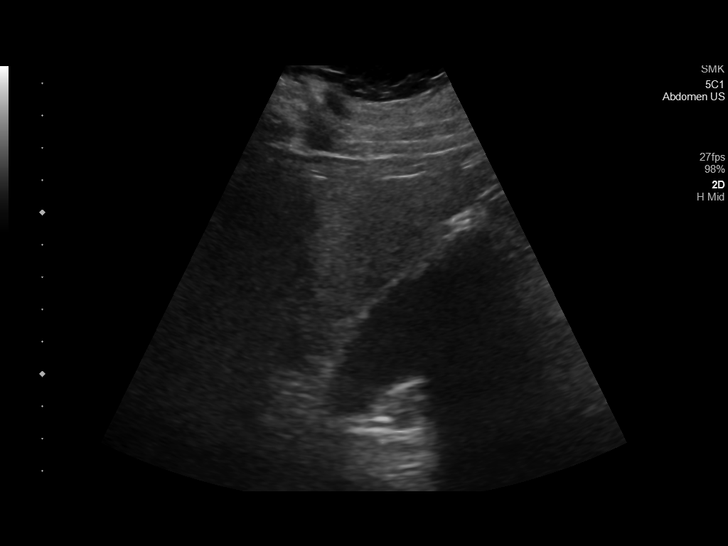
[im 5/56]
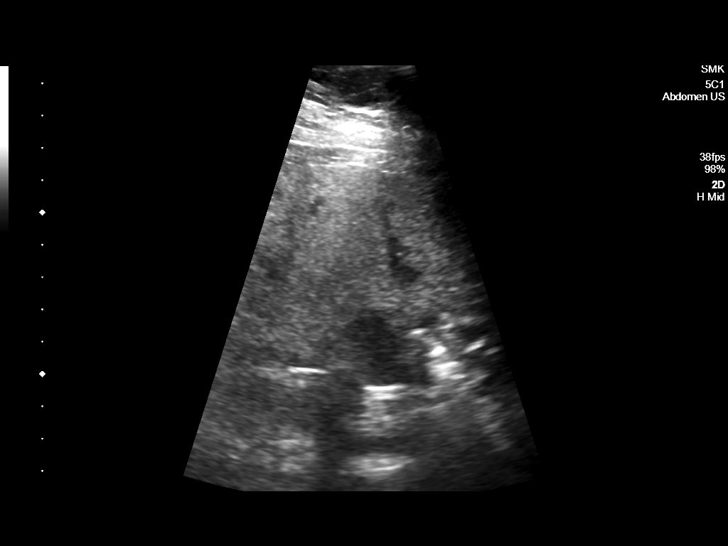
[im 10/56]
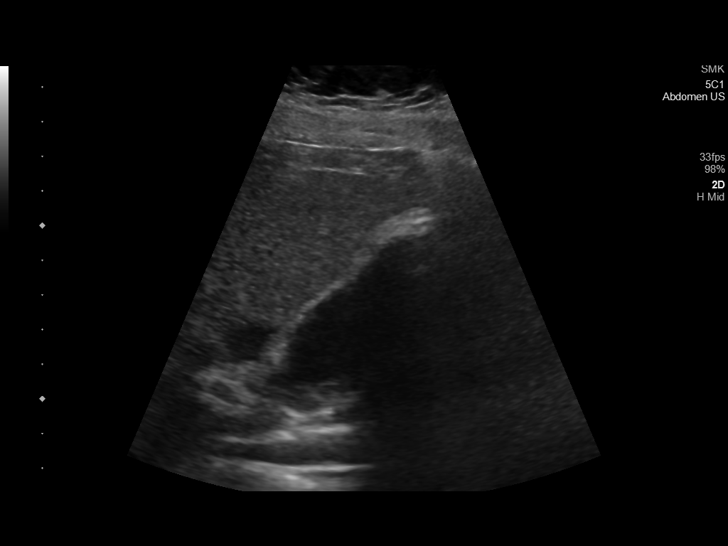
[im 14/56]
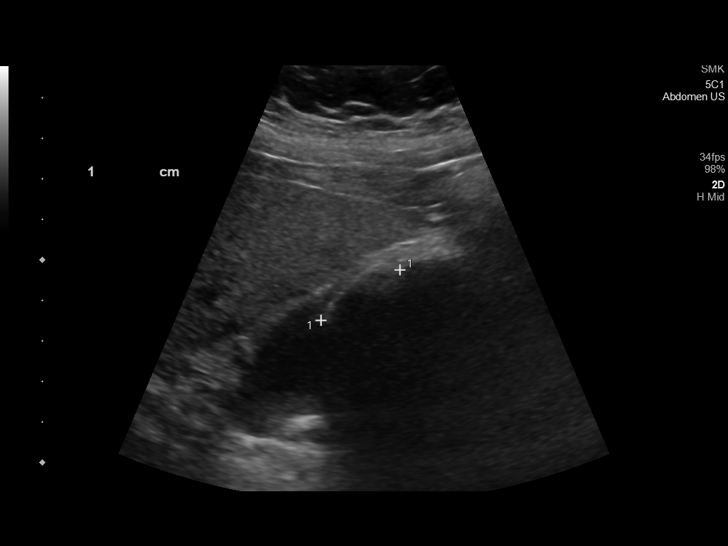
[im 19/56]
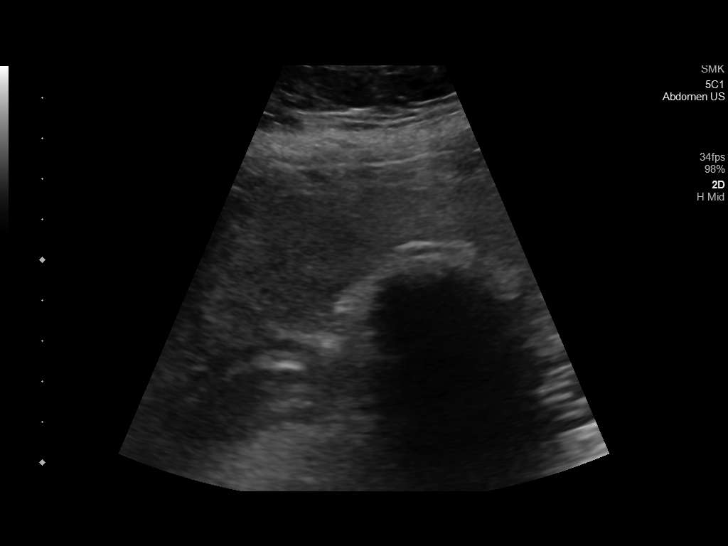
[im 21/56]
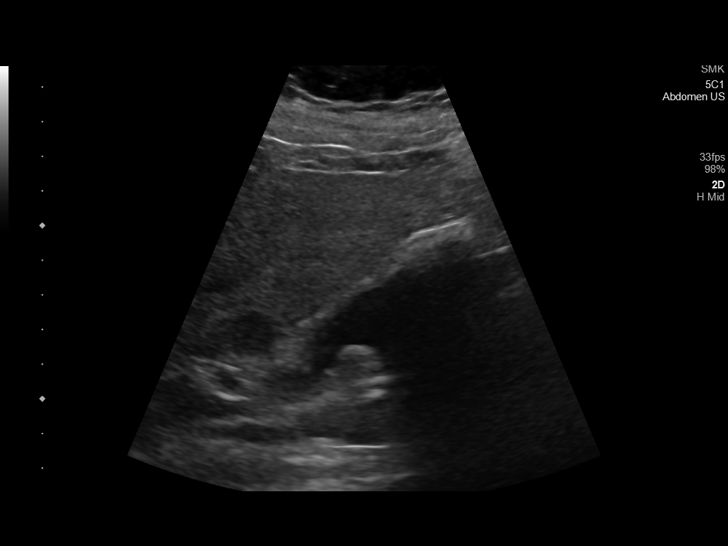
[im 26/56]
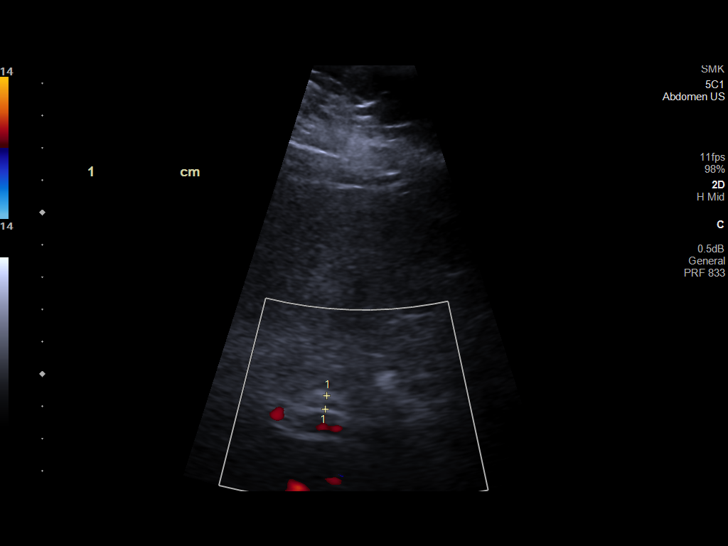
[im 30/56]
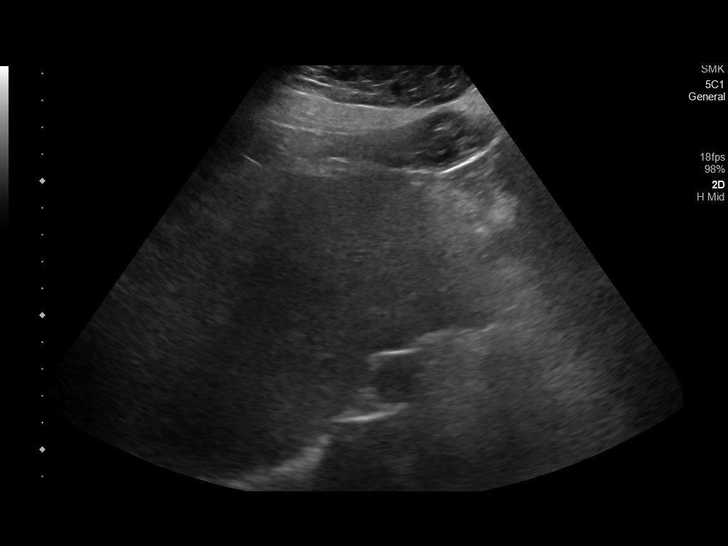
[im 35/56]
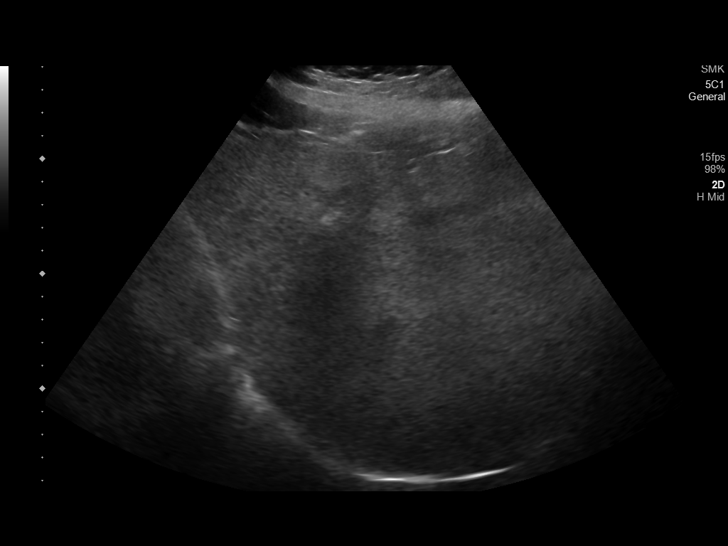
[im 37/56]
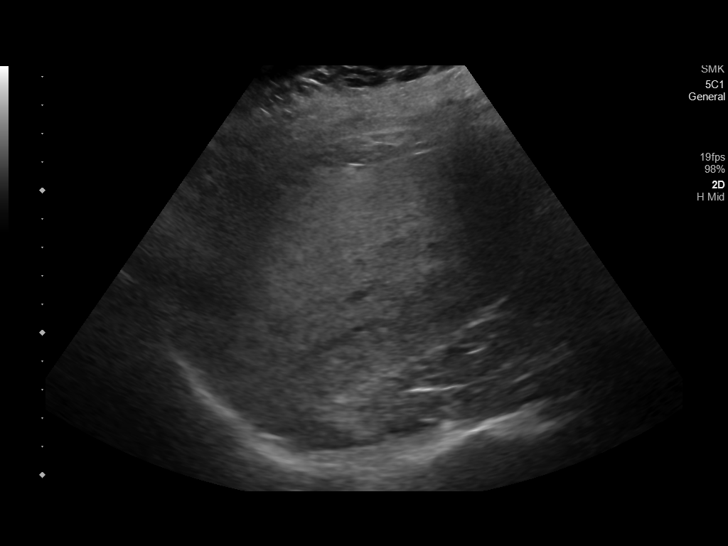
[im 42/56]
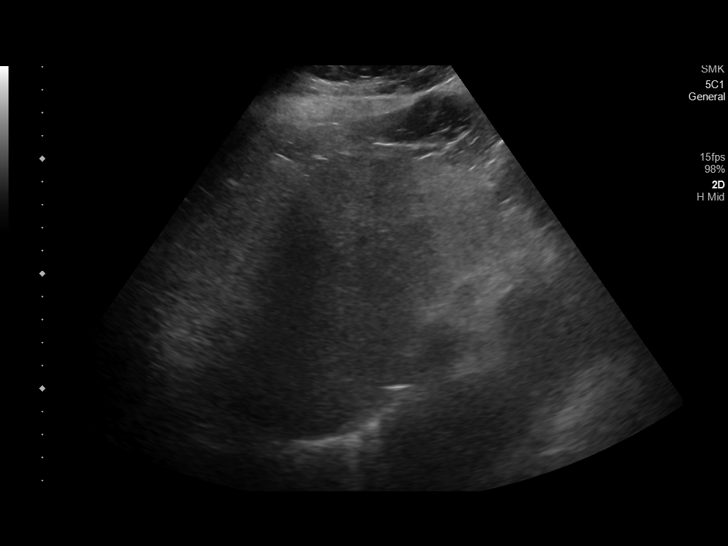
[im 46/56]
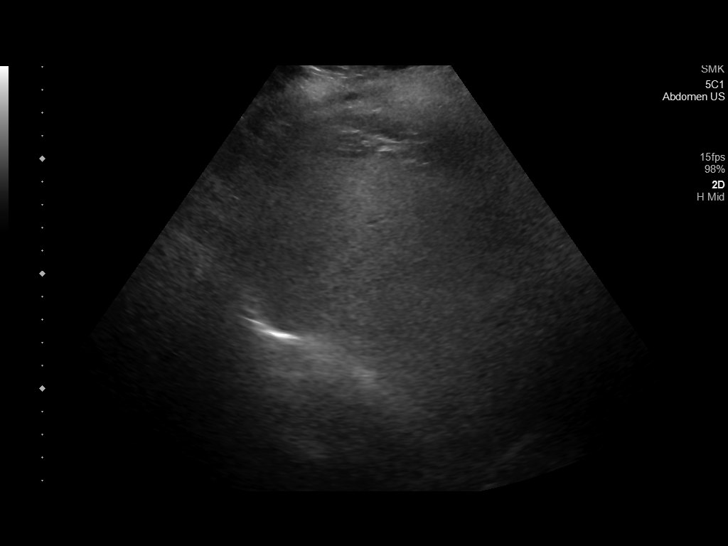
[im 51/56]
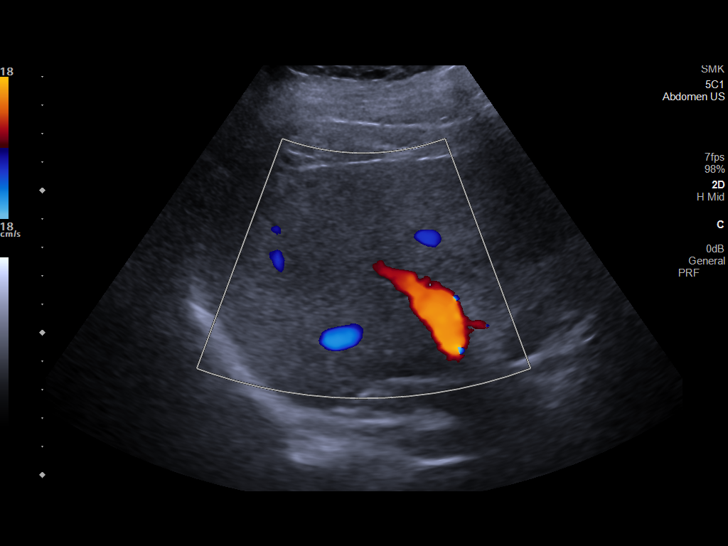
[im 56/56]
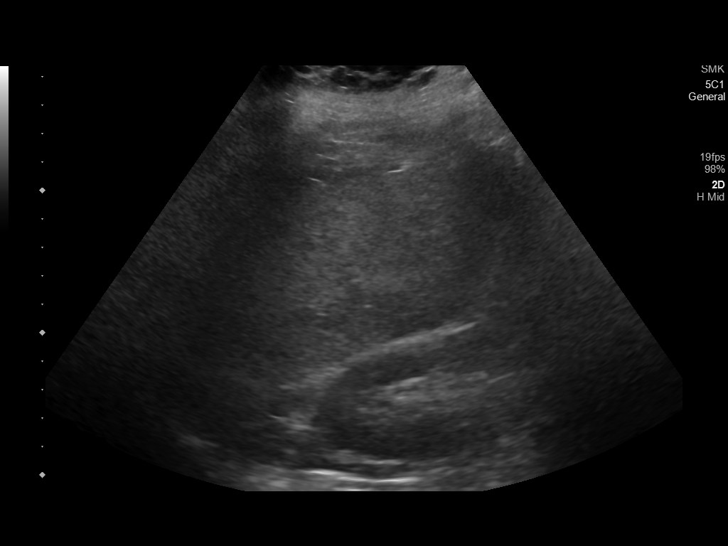

[14 of 25 positions shown; findings below may reference images not displayed]

FINDINGS: Gallbladder:

There are multiple echogenic foci within the gallbladder which move
and shadow consistent with cholelithiasis. Largest gallstone
measures 2.3 cm in length. Sludge is also noted in the gallbladder.
The gallbladder wall is not overtly thickened. There is no
pericholecystic fluid. No sonographic Murphy sign noted by
sonographer.

Common bile duct:

Diameter: 4 mm. No intrahepatic or extrahepatic biliary duct
dilatation.

Liver:

No focal lesion identified. Liver echogenicity is increased
diffusely. Portal vein is patent on color Doppler imaging with
normal direction of blood flow towards the liver.

Other: None.
IMPRESSION: 1. Cholelithiasis and sludge in gallbladder. No appreciable
gallbladder wall thickening or pericholecystic fluid.

2. Increase in liver echogenicity is likely indicative of hepatic
steatosis. No focal liver lesions evident. Note that the sensitivity
of ultrasound for detection of focal liver lesions is diminished in
this circumstance.

## 2023-04-27 ENCOUNTER — Other Ambulatory Visit: Payer: Self-pay

## 2023-04-27 ENCOUNTER — Encounter: Payer: Self-pay | Admitting: Emergency Medicine

## 2023-04-27 ENCOUNTER — Emergency Department
Admission: EM | Admit: 2023-04-27 | Discharge: 2023-04-28 | Disposition: A | Discharge status: Other Acute Inpatient Hospital | Payer: Medicaid Other | Attending: Emergency Medicine | Admitting: Emergency Medicine

## 2023-04-27 DIAGNOSIS — F209 Schizophrenia, unspecified: Secondary | ICD-10-CM | POA: Insufficient documentation

## 2023-04-27 DIAGNOSIS — I10 Essential (primary) hypertension: Secondary | ICD-10-CM | POA: Insufficient documentation

## 2023-04-27 DIAGNOSIS — Z79899 Other long term (current) drug therapy: Secondary | ICD-10-CM | POA: Insufficient documentation

## 2023-04-27 DIAGNOSIS — F17210 Nicotine dependence, cigarettes, uncomplicated: Secondary | ICD-10-CM | POA: Insufficient documentation

## 2023-04-27 LAB — COMPREHENSIVE METABOLIC PANEL
ALT: 17 U/L (ref 0–44)
AST: 21 U/L (ref 15–41)
Albumin: 4.1 g/dL (ref 3.5–5.0)
Alkaline Phosphatase: 77 U/L (ref 38–126)
Anion gap: 12 (ref 5–15)
BUN: 15 mg/dL (ref 6–20)
CO2: 20 mmol/L — ABNORMAL LOW (ref 22–32)
Calcium: 8.9 mg/dL (ref 8.9–10.3)
Chloride: 103 mmol/L (ref 98–111)
Creatinine, Ser: 0.67 mg/dL (ref 0.44–1.00)
GFR, Estimated: >60 mL/min (ref >60)
Glucose, Bld: 135 mg/dL — ABNORMAL HIGH (ref 70–99)
Potassium: 3.6 mmol/L (ref 3.5–5.1)
Sodium: 135 mmol/L (ref 135–145)
Total Bilirubin: 0.8 mg/dL (ref <1.2)
Total Protein: 7.7 g/dL (ref 6.5–8.1)

## 2023-04-27 LAB — ETHANOL: Alcohol, Ethyl (B): <10 mg/dL (ref <10)

## 2023-04-27 LAB — CBC
HCT: 46.3 % — ABNORMAL HIGH (ref 36.0–46.0)
Hemoglobin: 15.7 g/dL — ABNORMAL HIGH (ref 12.0–15.0)
MCH: 32.1 pg (ref 26.0–34.0)
MCHC: 33.9 g/dL (ref 30.0–36.0)
MCV: 94.7 fL (ref 80.0–100.0)
Platelets: 342 10*3/uL (ref 150–400)
RBC: 4.89 MIL/uL (ref 3.87–5.11)
RDW: 12.4 % (ref 11.5–15.5)
WBC: 11.2 10*3/uL — ABNORMAL HIGH (ref 4.0–10.5)
nRBC: 0 % (ref 0.0–0.2)

## 2023-04-27 LAB — SALICYLATE LEVEL: Salicylate Lvl: <7 mg/dL — ABNORMAL LOW (ref 7.0–30.0)

## 2023-04-27 LAB — ACETAMINOPHEN LEVEL: Acetaminophen (Tylenol), Serum: <10 ug/mL — ABNORMAL LOW (ref 10–30)

## 2023-04-27 MED ORDER — LORAZEPAM 1 MG PO TABS
1.0000 mg | ORAL_TABLET | Freq: Once | ORAL | Status: AC
  Administered 2023-04-27: 1 mg via ORAL
  Filled 2023-04-27: qty 1

## 2023-04-27 NOTE — ED Provider Notes (Signed)
[facility] Provider Note    Event Date/Time   First MD Initiated Contact with Patient 04/27/23 2038     (approximate)  Spanish interpreter used for this evaluation.  History   Chief Complaint: Mental Health Problem  HPI  Isabella [name] is a 47 y.o. female with a past medical history of bipolar, schizophrenia, hypertension, presents to the emergency department for medical and psychiatric evaluation.  According to report patient was just released from jail 2 days ago, patient states she was receiving medications while in jail but has not been taking any medication since getting out of jail.  Patient's mother per report states patient has been acting erratic and has been "tearing up" the house since she came home.  States a history of schizophrenia, history of psychotic breaks.  Patient here is awake alert she states she feels very anxious but denies any other symptoms.  Patient admits that she has not been taking her medication but does not know what medication she is supposed to be taking.  Patient denies any medical complaints.  Physical Exam   Triage Vital Signs: ED Triage Vitals  Encounter Vitals Group     BP 04/27/23 2027 118/76     Systolic BP Percentile --      Diastolic BP Percentile --      Pulse Rate 04/27/23 2027 (!) 127     Resp 04/27/23 2027 20     Temp 04/27/23 2027 98.4 F (36.9 C)     Temp Source 04/27/23 2027 Oral     SpO2 04/27/23 2027 100 %     Weight 04/27/23 2028 197 lb 8.5 oz (89.6 kg)     Height 04/27/23 2028 5\' 4"  (1.626 m)     Head Circumference --      Peak Flow --      Pain Score 04/27/23 2028 0     Pain Loc --      Pain Education --      Exclude from Growth Chart --     Most recent vital signs: Vitals:   04/27/23 2027  BP: 118/76  Pulse: (!) 127  Resp: 20  Temp: 98.4 F (36.9 C)  SpO2: 100%    General: Awake, no distress.  CV:  Good peripheral perfusion.  Regular rate and rhythm  Resp:  Normal effort.   Equal breath sounds bilaterally.  Abd:  No distention.  Soft, nontender.  No rebound or guarding.  ED Results / Procedures / Treatments   MEDICATIONS ORDERED IN ED: Medications - No data to display   IMPRESSION / MDM / ASSESSMENT AND PLAN / ED COURSE  I reviewed the triage vital signs and the nursing notes.  Patient's presentation is most consistent with acute presentation with potential threat to life or bodily function.  Patient presents to the emergency department for reported agitation/aggression at home.  Patient mitts she has not been taking any of her psychiatric medications or any medication since being released from jail 2 days ago.  Given the report of significant behavioral disruption at home we will place the patient under an IVC until the patient can be adequately evaluated by psychiatry.  We will check labs and continue to closely monitor.  Will dose a small dose of Ativan for anxiety while awaiting further workup.  Patient has no medical complaints at this time.  Patient's chemistry is reassuring, ethanol salicylate acetaminophen negative, CBC reassuring.  Medically cleared awaiting for psychiatric evaluation and disposition.  FINAL CLINICAL IMPRESSION(S) /  ED DIAGNOSES   Schizophrenia    Note:  This document was prepared using Dragon voice recognition software and may include unintentional dictation errors.   Minna Antis, MD 04/27/23 2216

## 2023-04-27 NOTE — ED Triage Notes (Addendum)
Patient ambulatory to triage. Patient will not participate in triage process at this time. Appears to stare off, does not answer questions. Mother and brother provide collateral and state that she just got out of jail 2 days ago, she has a history of bipolar disorder and psychotic break. Unknown if she received her medications in jail. Mother states that she has been acting erratic and tearing up the house since she came home. Interpreter [phone] used for triage process and to explain the psych process to patient with stated understanding.

## 2023-04-27 NOTE — ED Notes (Signed)
Pt reports she just got of jail 2 days ago.  Pt states she was receiving her meds in jail.   Pt reports feeling anxious. Pt denies SI or HI.  Pt denies drugs or etoh use.  Interpreter on a stick used in room with nurse and md at bedside with pt.   Pt calm and cooperative.

## 2023-04-27 NOTE — BH Assessment (Signed)
Comprehensive Clinical Assessment (CCA) Note  04/27/2023 Isabella [name] [id number] Recommendations for Services/Supports/Treatments: Psych NP Rashaun D. determined pt. meets psychiatric inpatient criteria. Isabella [name] is a 47 year old, English speaking, Hispanic female with a hx of schizophrenia. Pt is under IVC. Per triage note: Patient ambulatory to triage. Patient will not participate in triage process at this time. Appears to stare off, does not answer questions. Mother and brother provide collateral and state that she just got out of jail 2 days ago, she has a history of bipolar disorder and psychotic break. Unknown if she received her medications in jail. Mother states that she has been acting erratic and tearing up the house since she came home.     Pt was resting upon this writer's arrival. Pt presented with soft speech.  Despite a low volume, the pt.'s thoughts were relevant to the situation. Pt reported that she felt better after receiving anxiety medications. Motor behavior was restless and repetitive. Pt was presented with a euthymic mood; affect was anxious. Pt had a casual appearance. Pt explained that she'd gone to jail 5-6x for various reason, including trespassing and committing robberies. Pt identified her main need is to get back on her medication and resume her psychiatric appointments with Dr. Excell Seltzer of [facility]. Pt denied having destroyed her mother's house. Pt denied SI/HI/AV/H.   Chief Complaint:  Chief Complaint  Patient presents with   Mental Health Problem   Visit Diagnosis: Schizophrenia    CCA Screening, Triage and Referral (STR)  Patient Reported Information How did you hear about [place]? No data recorded Referral name: No data recorded Referral phone number: No data recorded  Whom do you see for routine medical problems? No data recorded Practice/Facility Name: No data recorded Practice/Facility Phone Number: No data recorded Name of Contact: No  data recorded Contact Number: No data recorded Contact Fax Number: No data recorded Prescriber Name: No data recorded Prescriber Address (if known): No data recorded  What Is the Reason for Your Visit/Call Today? No data recorded How Long Has This Been Causing You Problems? No data recorded What Do You Feel Would Help You the Most Today? No data recorded  Have You Recently Been in Any Inpatient Treatment (Hospital/Detox/Crisis Center/28-Day Program)? No data recorded Name/Location of Program/Hospital:No data recorded How Long Were You There? No data recorded When Were You Discharged? No data recorded  Have You Ever Received Services From [facility] Before? No data recorded Who Do You See at [facility]? No data recorded  Have You Recently Had Any Thoughts About Hurting Yourself? No data recorded Are You Planning to Commit Suicide/Harm Yourself At This time? No data recorded  Have you Recently Had Thoughts About Hurting Someone [name]? No data recorded Explanation: No data recorded  Have You Used Any Alcohol or Drugs in the Past 24 Hours? No data recorded How Long Ago Did You Use Drugs or Alcohol? No data recorded What Did You Use and How Much? No data recorded  Do You Currently Have a Therapist/Psychiatrist? No data recorded Name of Therapist/Psychiatrist: No data recorded  Have You Been Recently Discharged From Any Office Practice or Programs? No data recorded Explanation of Discharge From Practice/Program: No data recorded    CCA Screening Triage Referral Assessment Type of Contact: No data recorded Is this Initial or Reassessment? No data recorded Date Telepsych consult ordered in [facility]:  No data recorded Time Telepsych consult ordered in [facility]:  No data recorded  Patient Reported Information Reviewed? No data recorded Patient  Left Without Being Seen? No data recorded Reason for Not Completing Assessment: No data recorded  Collateral Involvement: No data recorded  Does  Patient Have a Court Appointed Legal Guardian? No data recorded Name and Contact of Legal Guardian: No data recorded If Minor and Not Living with Parent(s), Who has Custody? No data recorded Is CPS involved or ever been involved? No data recorded Is APS involved or ever been involved? No data recorded  Patient Determined To Be At Risk for Harm To Self or Others Based on Review of Patient Reported Information or Presenting Complaint? No data recorded Method: No data recorded Availability of Means: No data recorded Intent: No data recorded Notification Required: No data recorded Additional Information for Danger to Others Potential: No data recorded Additional Comments for Danger to Others Potential: No data recorded Are There Guns or Other Weapons in Your Home? No data recorded Types of Guns/Weapons: No data recorded Are These Weapons Safely Secured?                            No data recorded Who Could Verify You Are Able To Have These Secured: No data recorded Do You Have any Outstanding Charges, Pending Court Dates, Parole/Probation? No data recorded Contacted To Inform of Risk of Harm To Self or Others: No data recorded  Location of Assessment: No data recorded  Does Patient Present under Involuntary Commitment? No data recorded IVC Papers Initial File Date: No data recorded  [place] of Residence: No data recorded  Patient Currently Receiving the Following Services: No data recorded  Determination of Need: No data recorded  Options For Referral: No data recorded    CCA Biopsychosocial Intake/Chief Complaint:  No data recorded Current Symptoms/Problems: No data recorded  Patient Reported Schizophrenia/Schizoaffective Diagnosis in Past: No data recorded  Strengths: No data recorded Preferences: No data recorded Abilities: No data recorded  Type of Services Patient Feels are Needed: No data recorded  Initial Clinical Notes/Concerns: No data recorded  Mental Health  Symptoms Depression:  No data recorded  Duration of Depressive symptoms: No data recorded  Mania:  No data recorded  Anxiety:   No data recorded  Psychosis:  No data recorded  Duration of Psychotic symptoms: No data recorded  Trauma:  No data recorded  Obsessions:  No data recorded  Compulsions:  No data recorded  Inattention:  No data recorded  Hyperactivity/Impulsivity:  No data recorded  Oppositional/Defiant Behaviors:  No data recorded  Emotional Irregularity:  No data recorded  Other Mood/Personality Symptoms:  No data recorded   Mental Status Exam Appearance and self-care  Stature:  No data recorded  Weight:  No data recorded  Clothing:  No data recorded  Grooming:  No data recorded  Cosmetic use:  No data recorded  Posture/gait:  No data recorded  Motor activity:  No data recorded  Sensorium  Attention:  No data recorded  Concentration:  No data recorded  Orientation:  No data recorded  Recall/memory:  No data recorded  Affect and Mood  Affect:  No data recorded  Mood:  No data recorded  Relating  Eye contact:  No data recorded  Facial expression:  No data recorded  Attitude toward examiner:  No data recorded  Thought and Language  Speech flow: No data recorded  Thought content:  No data recorded  Preoccupation:  No data recorded  Hallucinations:  No data recorded  Organization:  No data recorded  Executive Functions  Fund of Knowledge:  No data recorded  Intelligence:  No data recorded  Abstraction:  No data recorded  Judgement:  No data recorded  Reality Testing:  No data recorded  Insight:  No data recorded  Decision Making:  No data recorded  Social Functioning  Social Maturity:  No data recorded  Social Judgement:  No data recorded  Stress  Stressors:  No data recorded  Coping Ability:  No data recorded  Skill Deficits:  No data recorded  Supports:  No data recorded    Religion:    Leisure/Recreation:    Exercise/Diet:     CCA  Employment/Education Employment/Work Situation:    Education:     CCA Family/Childhood History Family and Relationship History:    Childhood History:     Child/Adolescent Assessment:     CCA Substance Use Alcohol/Drug Use:                           ASAM's:  Six Dimensions of Multidimensional Assessment  Dimension 1:  Acute Intoxication and/or Withdrawal Potential:      Dimension 2:  Biomedical Conditions and Complications:      Dimension 3:  Emotional, Behavioral, or Cognitive Conditions and Complications:     Dimension 4:  Readiness to Change:     Dimension 5:  Relapse, Continued use, or Continued Problem Potential:     Dimension 6:  Recovery/Living Environment:     ASAM Severity Score:    ASAM Recommended Level of Treatment:     Substance use Disorder (SUD)    Recommendations for Services/Supports/Treatments:    DSM5 Diagnoses: Patient Active Problem List   Diagnosis Date Noted   Sepsis (HCC) 07/23/2020   SIRS (systemic inflammatory response syndrome) (HCC) 07/22/2020   HTN (hypertension) 07/22/2020   AKI (acute kidney injury) (HCC) 07/22/2020   Tobacco abuse 07/22/2020   Abnormal LFTs    LFTs abnormal 07/21/2020   

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
## 2023-04-27 NOTE — ED Notes (Signed)
Patient belongings: 1 black and brown shirt 1 pink jacket 1 black bra 1 black underwear 1 bluejean pants 2 black shoes 1 black hair band

## 2023-04-28 ENCOUNTER — Inpatient Hospital Stay
Admission: AD | Admit: 2023-04-28 | Discharge: 2023-05-07 | DRG: 885 | Disposition: A | Discharge status: Home / Self Care | Payer: Medicaid Other | Source: Intra-hospital | Attending: Psychiatry | Admitting: Psychiatry

## 2023-04-28 ENCOUNTER — Encounter: Payer: Self-pay | Admitting: Psychiatric/Mental Health

## 2023-04-28 DIAGNOSIS — F333 Major depressive disorder, recurrent, severe with psychotic symptoms: Secondary | ICD-10-CM | POA: Diagnosis not present

## 2023-04-28 DIAGNOSIS — F419 Anxiety disorder, unspecified: Secondary | ICD-10-CM | POA: Diagnosis present

## 2023-04-28 DIAGNOSIS — I10 Essential (primary) hypertension: Secondary | ICD-10-CM | POA: Diagnosis present

## 2023-04-28 DIAGNOSIS — F202 Catatonic schizophrenia: Principal | ICD-10-CM | POA: Diagnosis present

## 2023-04-28 DIAGNOSIS — F200 Paranoid schizophrenia: Secondary | ICD-10-CM

## 2023-04-28 DIAGNOSIS — Z833 Family history of diabetes mellitus: Secondary | ICD-10-CM

## 2023-04-28 DIAGNOSIS — F17210 Nicotine dependence, cigarettes, uncomplicated: Secondary | ICD-10-CM | POA: Diagnosis present

## 2023-04-28 DIAGNOSIS — F332 Major depressive disorder, recurrent severe without psychotic features: Principal | ICD-10-CM | POA: Diagnosis present

## 2023-04-28 DIAGNOSIS — Z79899 Other long term (current) drug therapy: Secondary | ICD-10-CM

## 2023-04-28 DIAGNOSIS — Z603 Acculturation difficulty: Secondary | ICD-10-CM | POA: Diagnosis present

## 2023-04-28 DIAGNOSIS — G4700 Insomnia, unspecified: Secondary | ICD-10-CM | POA: Diagnosis present

## 2023-04-28 DIAGNOSIS — Z91148 Patient's other noncompliance with medication regimen for other reason: Secondary | ICD-10-CM | POA: Diagnosis not present

## 2023-04-28 LAB — URINE DRUG SCREEN, QUALITATIVE (ARMC ONLY)
Amphetamines, Ur Screen: NOT DETECTED
Barbiturates, Ur Screen: NOT DETECTED
Benzodiazepine, Ur Scrn: NOT DETECTED
Cannabinoid 50 Ng, Ur ~~LOC~~: NOT DETECTED
Cocaine Metabolite,Ur ~~LOC~~: NOT DETECTED
MDMA (Ecstasy)Ur Screen: NOT DETECTED
Methadone Scn, Ur: NOT DETECTED
Opiate, Ur Screen: NOT DETECTED
Phencyclidine (PCP) Ur S: NOT DETECTED
Tricyclic, Ur Screen: NOT DETECTED

## 2023-04-28 LAB — POC URINE PREG, ED: Preg Test, Ur: NEGATIVE

## 2023-04-28 MED ORDER — ALUM & MAG HYDROXIDE-SIMETH 200-200-20 MG/5ML PO SUSP: 30.0000 mL | ORAL | Status: DC | PRN

## 2023-04-28 MED ORDER — OLANZAPINE 10 MG PO TABS: 10.0000 mg | ORAL_TABLET | Freq: Every day | ORAL | Status: DC

## 2023-04-28 MED ORDER — MAGNESIUM HYDROXIDE 400 MG/5ML PO SUSP: 30.0000 mL | Freq: Every day | ORAL | Status: DC | PRN

## 2023-04-28 MED ORDER — HYDROXYZINE HCL 50 MG PO TABS
50.0000 mg | ORAL_TABLET | Freq: Four times a day (QID) | ORAL | Status: DC | PRN
  Administered 2023-04-29 (×2): 50 mg via ORAL
  Filled 2023-04-28 (×3): qty 1

## 2023-04-28 MED ORDER — FLUOXETINE HCL 20 MG PO CAPS
20.0000 mg | ORAL_CAPSULE | Freq: Every day | ORAL | Status: DC
  Administered 2023-04-29 – 2023-05-07 (×9): 20 mg via ORAL
  Filled 2023-04-28 (×9): qty 1

## 2023-04-28 MED ORDER — ACETAMINOPHEN 325 MG PO TABS: 650.0000 mg | ORAL_TABLET | Freq: Four times a day (QID) | ORAL | Status: DC | PRN

## 2023-04-28 MED ORDER — NICOTINE POLACRILEX 2 MG MT GUM
2.0000 mg | CHEWING_GUM | OROMUCOSAL | Status: DC | PRN
Start: 2023-04-28 — End: 2023-05-07

## 2023-04-28 MED ORDER — DIPHENHYDRAMINE HCL 25 MG PO CAPS: 50.0000 mg | ORAL_CAPSULE | Freq: Three times a day (TID) | ORAL | Status: DC | PRN

## 2023-04-28 MED ORDER — HALOPERIDOL 5 MG PO TABS: 5.0000 mg | ORAL_TABLET | Freq: Three times a day (TID) | ORAL | Status: DC | PRN

## 2023-04-28 MED ORDER — MELATONIN 5 MG PO TABS
5.0000 mg | ORAL_TABLET | Freq: Every day | ORAL | Status: DC
  Administered 2023-04-29 – 2023-05-06 (×4): 5 mg via ORAL
  Filled 2023-04-28 (×4): qty 1

## 2023-04-28 NOTE — Tx Team (Signed)
Initial Treatment Plan 04/28/2023 5:49 PM Stefan Blehm [facility] ZOX:[id number]    PATIENT STRESSORS: Legal issue   Marital or family conflict   Medication change or noncompliance     PATIENT STRENGTHS: Supportive family/friends    PATIENT IDENTIFIED PROBLEMS: Disorganized/erratic behavior prior to admission  Medication noncompliance  Depression                 DISCHARGE CRITERIA:  Ability to meet basic life and health needs Improved stabilization in mood, thinking, and/or behavior Need for constant or close observation no longer present Reduction of life-threatening or endangering symptoms to within safe limits  PRELIMINARY DISCHARGE PLAN: Outpatient therapy Return to previous living arrangement  PATIENT/FAMILY INVOLVEMENT: This treatment plan has been presented to and reviewed with the patient, Isabella [name]. The patient has been given the opportunity to ask questions and make suggestions.  Jovanne Riggenbach, RN 04/28/2023, 5:49 PM

## 2023-04-28 NOTE — ED Notes (Signed)
ivc/consult done/recommend psychiatric inpatient admission when medically cleared.. 

## 2023-04-28 NOTE — Group Note (Signed)
Date:  04/28/2023 Time:  8:58 PM  Group Topic/Focus:  Wrap-Up Group:   The focus of this group is to help patients review their daily goal of treatment and discuss progress on daily workbooks.    Participation Level:  Did Not Attend   Katina Dung 04/28/2023, 8:58 PM

## 2023-04-28 NOTE — Progress Notes (Signed)
Patient refused scheduled Prozac. NP will be notified via secure chat.

## 2023-04-28 NOTE — Progress Notes (Signed)
   04/28/23 1736  Psych Admission Type (Psych Patients Only)  Admission Status Involuntary  Psychosocial Assessment  Patient Complaints Depression  Eye Contact Brief  Facial Expression Sullen  Affect Sullen  Speech Soft;Elective mutism  Interaction Avoidant;Forwards little;Guarded;No initiation;Poor;Isolative (patient has been isolative to room since being admitted to unit.)  Motor Activity Slow  Appearance/Hygiene Poor hygiene;In scrubs  Behavior Characteristics Unwilling to participate  Mood Depressed  Aggressive Behavior  Effect No apparent injury  Thought Process  Coherency Unable to assess  Content UTA  Delusions UTA  Perception UTA  Hallucination UTA  Judgment UTA  Confusion UTA  Danger to Self  Current suicidal ideation? Denies  Danger to Others  Danger to Others None reported or observed

## 2023-04-28 NOTE — Plan of Care (Signed)
New admission.  Problem: Education: Goal: Knowledge of  General Education information/materials will improve Outcome: Not Progressing Goal: Emotional status will improve Outcome: Not Progressing Goal: Mental status will improve Outcome: Not Progressing Goal: Verbalization of understanding the information provided will improve Outcome: Not Progressing   Problem: Activity: Goal: Interest or engagement in activities will improve Outcome: Not Progressing Goal: Sleeping patterns will improve Outcome: Not Progressing   Problem: Coping: Goal: Ability to verbalize frustrations and anger appropriately will improve Outcome: Not Progressing Goal: Ability to demonstrate self-control will improve Outcome: Not Progressing   Problem: Health Behavior/Discharge Planning: Goal: Identification of resources available to assist in meeting health care needs will improve Outcome: Not Progressing Goal: Compliance with treatment plan for underlying cause of condition will improve Outcome: Not Progressing   Problem: Physical Regulation: Goal: Ability to maintain clinical measurements within normal limits will improve Outcome: Not Progressing   Problem: Safety: Goal: Periods of time without injury will increase Outcome: Not Progressing   Problem: Education: Goal: Utilization of techniques to improve thought processes will improve Outcome: Not Progressing Goal: Knowledge of the prescribed therapeutic regimen will improve Outcome: Not Progressing   Problem: Coping: Goal: Coping ability will improve Outcome: Not Progressing Goal: Will verbalize feelings Outcome: Not Progressing

## 2023-04-28 NOTE — Group Note (Signed)
Date:  04/28/2023 Time:  5:46 PM  Group Topic/Focus:  Wellness Toolbox:   The focus of this group is to discuss various aspects of wellness, balancing those aspects and exploring ways to increase the ability to experience wellness.  Patients will create a wellness toolbox for use upon discharge.    Participation Level:  Did Not Attend   Lynelle Smoke [facility] 04/28/2023, 5:46 PM

## 2023-04-28 NOTE — ED Notes (Signed)
Breakfast provided. Requested pt to give urine sample, declines.

## 2023-04-28 NOTE — Progress Notes (Signed)
Patient refused evening vitals.

## 2023-04-29 DIAGNOSIS — F333 Major depressive disorder, recurrent, severe with psychotic symptoms: Secondary | ICD-10-CM | POA: Diagnosis not present

## 2023-04-29 MED ORDER — LORAZEPAM 1 MG PO TABS
1.0000 mg | ORAL_TABLET | Freq: Two times a day (BID) | ORAL | Status: DC
  Administered 2023-04-29: 1 mg via ORAL
  Filled 2023-04-29 (×2): qty 1

## 2023-04-29 NOTE — Group Note (Signed)
Date:  04/29/2023 Time:  1:13 PM  Group Topic/Focus:  Conflict Resolution:   The focus of this group is to discuss the conflict resolution process and how it may be used upon discharge. Emotional Education:   The focus of this group is to discuss what feelings/emotions are, and how they are experienced. Managing Feelings:   The focus of this group is to identify what feelings patients have difficulty handling and develop a plan to handle them in a healthier way upon discharge.    Participation Level:  Did Not Attend  Participation Quality:    Affect:    Cognitive:    Insight:   Engagement in Group:    Modes of Intervention:    Additional Comments:    Tobi Leinweber 04/29/2023, 1:13 PM

## 2023-04-29 NOTE — Group Note (Signed)
Date:  04/29/2023 Time:  10:11 PM  Group Topic/Focus:  Wrap-Up Group:   The focus of this group is to help patients review their daily goal of treatment and discuss progress on daily workbooks.    Participation Level:  Active  Participation Quality:  Appropriate, Attentive, and Intrusive  Affect:  Appropriate  Cognitive:  Alert and Appropriate  Insight: Appropriate  Engagement in Group:  Engaged  Modes of Intervention:  Discussion  Additional Comments:    Maglione,Dejan Angert E 04/29/2023, 10:11 PM
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
## 2023-04-29 NOTE — Plan of Care (Signed)
  Problem: Education: Goal: Knowledge of [facility] Education information/materials will improve Outcome: Progressing   Problem: Education: Goal: Emotional status will improve Outcome: Progressing   Problem: Education: Goal: Mental status will improve Outcome: Progressing   

## 2023-04-29 NOTE — [facility] Counselor (Signed)
CSW attempted to complete PSA with patient. Patient was observed to be in a fetal position in her bed and presentation was catatonic. Unable to complete PSA. CSW will continue additional attempts.

## 2023-04-29 NOTE — Progress Notes (Signed)
   04/29/23 1730  Psych Admission Type (Psych Patients Only)  Admission Status Involuntary  Psychosocial Assessment  Patient Complaints Depression  Eye Contact Brief  Facial Expression Flat  Affect Sullen;Flat  Speech Slow  Interaction Cautious;Forwards little  Motor Activity Slow  Appearance/Hygiene Poor hygiene  Behavior Characteristics Unwilling to participate  Mood Labile;Sad  Aggressive Behavior  Effect No apparent injury  Thought Process  Coherency WDL  Delusions None reported or observed  Perception WDL  Hallucination None reported or observed  Judgment Impaired  Confusion None  Danger to Self  Current suicidal ideation? Denies (Denies)  Agreement Not to Harm Self Yes  Danger to Others  Danger to Others None reported or observed

## 2023-04-29 NOTE — Progress Notes (Signed)
   04/28/23 2000  Psych Admission Type (Psych Patients Only)  Admission Status Involuntary  Psychosocial Assessment  Patient Complaints Depression  Eye Contact Brief  Facial Expression Flat  Affect Sullen;Flat  Speech Soft;Slow  Interaction Cautious;Forwards little  Motor Activity Slow  Appearance/Hygiene Poor hygiene;Body odor;In scrubs  Behavior Characteristics Unwilling to participate  Mood Preoccupied;Sad  Aggressive Behavior  Effect No apparent injury  Thought Process  Coherency WDL  Content WDL  Delusions None reported or observed  Perception WDL  Hallucination None reported or observed  Judgment Impaired  Confusion None  Danger to Self  Current suicidal ideation? Denies  Danger to Others  Danger to Others None reported or observed   Patient denies SI/HI/AVH 15 minutes safety checks maintained, will continue to monitor.

## 2023-04-29 NOTE — [facility] Suicide Risk Assessment (Addendum)
[facility] Admission Suicide Risk Assessment   Nursing information obtained from:  Patient Demographic factors:  Low socioeconomic status Current Mental Status:  NA Loss Factors:  Legal issues Historical Factors:  NA Risk Reduction Factors:  Living with another person, especially a relative  Total Time spent with patient: 2 hours Principal Problem: MDD (major depressive disorder), recurrent episode, severe (HCC) Diagnosis:  Principal Problem:   MDD (major depressive disorder), recurrent episode, severe (HCC) Active Problems:   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HCC)  Subjective Data: 47 year old Hispanic female, was admitted by telehealth Per provider she feels very anxious and acknowledges a history of bipolar disorder, schizophrenia, and psychotic episodes. The patient denies suicidal ideation (SI), homicidal ideation (HI), auditory or visual hallucinations (AV/H). She reports being recently released from jail two days ago, during which she was reportedly receiving medication. Since her release, she has not taken any medications and does not recall what she was prescribed. She expresses a primary goal of resuming her psychiatric care with Dr. Excell Seltzer of [facility] and restarting her medication regimen.The patient's mother and brother provide collateral information, stating that the patient has been acting erratically and has been "tearing up" the house since her release. The patient denies this behavior. During admission evaluation the patient was observed walking and talking earlier in the day but now presents with significant difficulty speaking and answering questions. When asked their name, the patient hesitated and eventually responded. When asked about their location, the patient whispered, "hospital," but struggled to answer further questions. The patient appears hesitant and displays thought blocking during the interaction. No complaints were verbalized regarding physical or emotional distress.   Continued  Clinical Symptoms:  Alcohol Use Disorder Identification Test Final Score (AUDIT): 3 The "Alcohol Use Disorders Identification Test", Guidelines for Use in Primary Care, Second Edition.  World Science writer [facility]). Score between 0-7:  no or low risk or alcohol related problems. Score between 8-15:  moderate risk of alcohol related problems. Score between 16-19:  high risk of alcohol related problems. Score 20 or above:  warrants further diagnostic evaluation for alcohol dependence and treatment.   CLINICAL FACTORS:   Schizophrenia:   Paranoid or undifferentiated type Currently Psychotic   Musculoskeletal: Strength & Muscle Tone: decreased Gait & Station: unsteady Patient leans: N/A  Psychiatric Specialty Exam:  Presentation  General Appearance:  Disheveled; Fairly Groomed  Eye Contact: Minimal  Speech: Blocked; Pressured (whispering)  Speech Volume: Decreased  Handedness: Right   Mood and Affect  Mood: Dysphoric  Affect: Flat; Blunt; Non-Congruent   Thought Process  Thought Processes: Disorganized  Descriptions of Associations:Tangential  Orientation:Partial  Thought Content:Tangential  History of Schizophrenia/Schizoaffective disorder:Yes  Duration of Psychotic Symptoms:No data recorded Hallucinations:Hallucinations: Other (comment) (poor historian)  Ideas of Reference:-- (poor historian)  Suicidal Thoughts:Suicidal Thoughts: -- (poor historian)  Homicidal Thoughts:Homicidal Thoughts: -- (poor historian)   Sensorium  Memory: Immediate Poor; Remote Poor  Judgment: Impaired  Insight: None   Art therapist  Concentration: Poor  Attention Span: Poor  Recall: Fair  Fund of Knowledge: Poor  Language: Poor   Psychomotor Activity  Psychomotor Activity: Psychomotor Activity: Tremor; Decreased   Assets  Assets: Social Support   Sleep  Sleep: Sleep: Poor Number of Hours of Sleep: 4    Physical  Exam: Physical Exam Vitals and nursing note reviewed.  Constitutional:      Appearance: Normal appearance.  HENT:     Head: Normocephalic and atraumatic.     Nose: Nose normal.  Pulmonary:     Effort: Pulmonary  effort is normal.  Musculoskeletal:        General: Normal range of motion.     Cervical back: Normal range of motion.  Neurological:     Mental Status: She is alert. Mental status is at baseline.  Psychiatric:        Attention and Perception: She is inattentive.        Mood and Affect: Affect is flat and inappropriate.        Speech: Speech is delayed and tangential.        Behavior: Behavior is withdrawn.        Thought Content: Thought content is delusional.        Cognition and Memory: Cognition is impaired. She exhibits impaired recent memory.        Judgment: Judgment is inappropriate.    ROS Blood pressure (!) 133/90, pulse 89, temperature 98.4 F (36.9 C), resp. rate 18, height 5\' 4"  (1.626 m), weight 77.6 kg, SpO2 95%. Body mass index is 29.35 kg/m.   COGNITIVE FEATURES THAT CONTRIBUTE TO RISK:  Closed-mindedness    SUICIDE RISK:   Minimal: No identifiable suicidal ideation.  Patients presenting with no risk factors but with morbid ruminations; may be classified as minimal risk based on the severity of the depressive symptoms  PLAN OF CARE:  Baseline lipid panel before starting antipsychotics. Lorazepam (Ativan): Preferred due to its rapid onset and effectiveness. Start with 1-2 mg PO every 4-6 hours, then titrate based on response. Melatonin 5 mg nighly Sleep onset issues; useful for circadian rhythm disturbances. Aripiprazole 5mg  antipsychotic therapy should be reintroduced cautiously after catatonia resolves. Consider restarting or initiating atypical antipsychotic for psychosis prevention Contact [facility] to verify previous psychiatric treatment and medication history. I certify that inpatient services furnished can reasonably be  expected to improve the patient's condition.   Myriam Forehand, NP 04/29/2023, 2:57 PM

## 2023-04-30 LAB — LIPID PANEL
Cholesterol: 189 mg/dL (ref 0–200)
HDL: 51 mg/dL (ref >40)
LDL Cholesterol: 124 mg/dL — ABNORMAL HIGH (ref 0–99)
Total CHOL/HDL Ratio: 3.7 {ratio}
Triglycerides: 70 mg/dL (ref <150)
VLDL: 14 mg/dL (ref 0–40)

## 2023-04-30 MED ORDER — ARIPIPRAZOLE 5 MG PO TABS
5.0000 mg | ORAL_TABLET | Freq: Every day | ORAL | Status: DC
  Administered 2023-04-30 – 2023-05-02 (×3): 5 mg via ORAL
  Filled 2023-04-30 (×3): qty 1

## 2023-04-30 MED ORDER — LORAZEPAM 2 MG/ML IJ SOLN
1.0000 mg | Freq: Two times a day (BID) | INTRAMUSCULAR | Status: DC
  Administered 2023-04-30: 1 mg via INTRAMUSCULAR
  Filled 2023-04-30: qty 1

## 2023-04-30 MED ORDER — LORAZEPAM 1 MG PO TABS
1.0000 mg | ORAL_TABLET | Freq: Two times a day (BID) | ORAL | Status: DC
  Administered 2023-05-01 – 2023-05-07 (×13): 1 mg via ORAL
  Filled 2023-04-30 (×13): qty 1

## 2023-04-30 NOTE — Progress Notes (Signed)
   04/30/23 0845  Psych Admission Type (Psych Patients Only)  Admission Status Involuntary  Psychosocial Assessment  Patient Complaints Depression  Eye Contact Poor  Facial Expression Blank  Affect Sullen  Speech Elective mutism  Interaction No initiation  Motor Activity Other (Comment) (patient has not gotten out of bed)  Appearance/Hygiene Body odor;Disheveled;Poor hygiene;In scrubs  Behavior Characteristics Resistant to care;Unwilling to participate  Mood Sullen  Aggressive Behavior  Effect No apparent injury  Thought Process  Coherency Unable to assess  Content UTA  Delusions UTA  Perception UTA  Hallucination None reported or observed  Judgment Impaired  Confusion UTA  Danger to Self  Current suicidal ideation? Denies (by slightly shaking her head "no")  Danger to Others  Danger to Others None reported or observed

## 2023-04-30 NOTE — Progress Notes (Signed)
Patient refused scheduled Ativan. NP will be notified of this.

## 2023-04-30 NOTE — Progress Notes (Signed)
Pt presents catatonic, isolative to her room. Pt shook her head no when this writer asked if she was having any SI. This Clinical research associate held the pts sleep medication tonight. Pt given education, support, and encouragement to be active in her treatment plan. Pt being monitored Q 15 minutes for safety per unit protocol, remains safe on the unit

## 2023-04-30 NOTE — Group Note (Signed)
Date:  04/30/2023 Time:  10:15 PM  Group Topic/Focus:  Wrap-Up Group:   The focus of this group is to help patients review their daily goal of treatment and discuss progress on daily workbooks.      Additional Comments:  Pt very catatonic like state. Patient has been laying in the bed in the same position I left her in this morning when I got off night shift. Day shift said she did not make much movement today either, nor did she eat any meals.  Tech tried to encourage pt to eat and drink a snack by taking it to her room but she did not make movement to attempt consumption.  Pt clammy to the touch and has a glassed over look in her eyes.   Maglione,Kallie Depolo E 04/30/2023, 10:15 PM

## 2023-04-30 NOTE — Progress Notes (Signed)
Patient refused scheduled Abilify. Patient continues to lay in bed, not talking much nor moving in bed. Being that patient refuses to take PO medication, this writer administered IM Ativan, per NP orders. Patient tolerated injection well without any issues. Patient remains safe on the unit and will continue to be monitored. NP was notified face to face.
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
## 2023-04-30 NOTE — Group Note (Signed)
Date:  04/30/2023 Time:  11:48 AM  Group Topic/Focus:  Spirituality:   The focus of this group is to discuss how one's spirituality can aide in recovery.    Participation Level:  Did Not Attend   Mary Sella Kaarin Pardy 04/30/2023, 11:48 AM

## 2023-04-30 NOTE — Plan of Care (Signed)
  Problem: Education: Goal: Knowledge of [facility] Education information/materials will improve Outcome: Not Progressing Goal: Emotional status will improve Outcome: Not Progressing Goal: Mental status will improve Outcome: Not Progressing Goal: Verbalization of understanding the information provided will improve Outcome: Not Progressing   Problem: Activity: Goal: Interest or engagement in activities will improve Outcome: Not Progressing Goal: Sleeping patterns will improve Outcome: Not Progressing   Problem: Coping: Goal: Ability to verbalize frustrations and anger appropriately will improve Outcome: Not Progressing Goal: Ability to demonstrate self-control will improve Outcome: Not Progressing   Problem: Health Behavior/Discharge Planning: Goal: Identification of resources available to assist in meeting health care needs will improve Outcome: Not Progressing Goal: Compliance with treatment plan for underlying cause of condition will improve Outcome: Not Progressing   Problem: Physical Regulation: Goal: Ability to maintain clinical measurements within normal limits will improve Outcome: Not Progressing   Problem: Safety: Goal: Periods of time without injury will increase Outcome: Not Progressing   Problem: Education: Goal: Utilization of techniques to improve thought processes will improve Outcome: Not Progressing Goal: Knowledge of the prescribed therapeutic regimen will improve Outcome: Not Progressing   Problem: Coping: Goal: Coping ability will improve Outcome: Not Progressing Goal: Will verbalize feelings Outcome: Not Progressing

## 2023-04-30 NOTE — [facility] Counselor (Signed)
CSW attempted to complete PSA with patient. Patient still in a catatonic state. Assessment unable to be completed at this time. CSW will continue to follow.

## 2023-05-01 NOTE — BH IP Treatment Plan (Signed)
Interdisciplinary Treatment and Diagnostic Plan Update  05/01/2023 Time of Session: 09:42 Isabella [name] MRN: [id number]  Principal Diagnosis: MDD (major depressive disorder), recurrent episode, severe (HCC)  Secondary Diagnoses: Principal Problem:   MDD (major depressive disorder), recurrent episode, severe (HCC) Active Problems:   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HCC)   Current Medications:  Current Facility-Administered Medications  Medication Dose Route Frequency Provider Last Rate Last Admin   acetaminophen (TYLENOL) tablet 650 mg  650 mg Oral Q6H PRN Starkes-Perry, Juel Burrow, FNP       alum & mag hydroxide-simeth (MAALOX/MYLANTA) 200-200-20 MG/5ML suspension 30 mL  30 mL Oral Q4H PRN Starkes-Perry, Juel Burrow, FNP       ARIPiprazole (ABILIFY) tablet 5 mg  5 mg Oral Daily Myriam Forehand, NP   5 mg at 04/30/23 1311   haloperidol (HALDOL) tablet 5 mg  5 mg Oral TID PRN Maryagnes Amos, FNP       And   diphenhydrAMINE (BENADRYL) capsule 50 mg  50 mg Oral TID PRN Maryagnes Amos, FNP       FLUoxetine (PROZAC) capsule 20 mg  20 mg Oral Daily Myriam Forehand, NP   20 mg at 04/30/23 0845   hydrOXYzine (ATARAX) tablet 50 mg  50 mg Oral Q6H PRN Myriam Forehand, NP   50 mg at 04/29/23 2144   LORazepam (ATIVAN) tablet 1 mg  1 mg Oral BID Myriam Forehand, NP       Or   LORazepam (ATIVAN) injection 1 mg  1 mg Intramuscular BID Myriam Forehand, NP   1 mg at 04/30/23 1311   magnesium hydroxide (MILK OF MAGNESIA) suspension 30 mL  30 mL Oral Daily PRN Maryagnes Amos, FNP       melatonin tablet 5 mg  5 mg Oral QHS Myriam Forehand, NP   5 mg at 04/29/23 2133   nicotine polacrilex (NICORETTE) gum 2 mg  2 mg Oral PRN Myriam Forehand, NP       PTA Medications: Medications Prior to Admission  Medication Sig Dispense Refill Last Dose/Taking   FLUoxetine (PROZAC) 20 MG capsule Take 1 capsule (20 mg total) by mouth daily. 30 capsule 4    nicotine (NICODERM CQ - DOSED IN MG/24 HOURS) 21 mg/24hr  patch Place 1 patch (21 mg total) onto the skin daily. 28 patch 0    OLANZapine (ZYPREXA) 10 MG tablet Take 1 tablet (10 mg total) by mouth at bedtime. 30 tablet 4    polyethylene glycol (MIRALAX / GLYCOLAX) 17 g packet Take 17 g by mouth 2 (two) times daily. 14 each 0     Patient Stressors: Legal issue   Marital or family conflict   Medication change or noncompliance    Patient Strengths: Supportive family/friends   Treatment Modalities: Medication Management, Group therapy, Case management,  1 to 1 session with clinician, Psychoeducation, Recreational therapy.   Physician Treatment Plan for Primary Diagnosis: MDD (major depressive disorder), recurrent episode, severe (HCC) Long Term Goal(s): Improvement in symptoms so as ready for discharge   Short Term Goals: Ability to identify changes in lifestyle to reduce recurrence of condition will improve Ability to verbalize feelings will improve Ability to disclose and discuss suicidal ideas Ability to demonstrate self-control will improve Ability to identify and develop effective coping behaviors will improve Ability to maintain clinical measurements within normal limits will improve Compliance with prescribed medications will improve Ability to identify triggers associated with substance abuse/mental health issues will improve  Medication  Management: Evaluate patient's response, side effects, and tolerance of medication regimen.  Therapeutic Interventions: 1 to 1 sessions, Unit Group sessions and Medication administration.  Evaluation of Outcomes: Not Met  Physician Treatment Plan for Secondary Diagnosis: Principal Problem:   MDD (major depressive disorder), recurrent episode, severe (HCC) Active Problems:   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HCC)  Long Term Goal(s): Improvement in symptoms so as ready for discharge   Short Term Goals: Ability to identify changes in lifestyle to reduce recurrence of condition will improve Ability to  verbalize feelings will improve Ability to disclose and discuss suicidal ideas Ability to demonstrate self-control will improve Ability to identify and develop effective coping behaviors will improve Ability to maintain clinical measurements within normal limits will improve Compliance with prescribed medications will improve Ability to identify triggers associated with substance abuse/mental health issues will improve     Medication Management: Evaluate patient's response, side effects, and tolerance of medication regimen.  Therapeutic Interventions: 1 to 1 sessions, Unit Group sessions and Medication administration.  Evaluation of Outcomes: Not Met   RN Treatment Plan for Primary Diagnosis: MDD (major depressive disorder), recurrent episode, severe (HCC) Long Term Goal(s): Knowledge of disease and therapeutic regimen to maintain health will improve  Short Term Goals: Ability to remain free from injury will improve, Ability to verbalize frustration and anger appropriately will improve, Ability to demonstrate self-control, Ability to participate in decision making will improve, Ability to verbalize feelings will improve, Ability to disclose and discuss suicidal ideas, Ability to identify and develop effective coping behaviors will improve, and Compliance with prescribed medications will improve  Medication Management: RN will administer medications as ordered by provider, will assess and evaluate patient's response and provide education to patient for prescribed medication. RN will report any adverse and/or side effects to prescribing provider.  Therapeutic Interventions: 1 on 1 counseling sessions, Psychoeducation, Medication administration, Evaluate responses to treatment, Monitor vital signs and CBGs as ordered, Perform/monitor CIWA, COWS, AIMS and Fall Risk screenings as ordered, Perform wound care treatments as ordered.  Evaluation of Outcomes: Not Met   LCSW Treatment Plan for  Primary Diagnosis: MDD (major depressive disorder), recurrent episode, severe (HCC) Long Term Goal(s): Safe transition to appropriate next level of care at discharge, Engage patient in therapeutic group addressing interpersonal concerns.  Short Term Goals: Engage patient in aftercare planning with referrals and resources, Increase social support, Increase ability to appropriately verbalize feelings, Increase emotional regulation, Facilitate acceptance of mental health diagnosis and concerns, and Increase skills for wellness and recovery  Therapeutic Interventions: Assess for all discharge needs, 1 to 1 time with Social worker, Explore available resources and support systems, Assess for adequacy in community support network, Educate family and significant other(s) on suicide prevention, Complete Psychosocial Assessment, Interpersonal group therapy.  Evaluation of Outcomes: Not Met   Progress in Treatment: Attending groups: No. Participating in groups: No. Taking medication as prescribed: Yes. Toleration medication: Yes. Family/Significant other contact made: No, will contact:  when given permission. Patient understands diagnosis: Yes. Discussing patient identified problems/goals with staff: No. Medical problems stabilized or resolved: Yes. Denies suicidal/homicidal ideation: No. Issues/concerns per patient self-inventory: No. Other: none.  New problem(s) identified: No, Describe:  none identified.  New Short Term/Long Term Goal(s): elimination of symptoms of psychosis, medication management for mood stabilization; elimination of SI thoughts; development of comprehensive mental wellness plan.  Patient Goals:  Pt was unable to meet with treatment team due to catatonia.   Discharge Plan or Barriers: CSW will assist pt with development of  an appropriate aftercare/discharge plan.   Reason for Continuation of Hospitalization: Depression Medication stabilization Suicidal  ideation  Estimated Length of Stay: 1-7 days  Last 3 [place] Suicide Severity Risk Score: Flowsheet Row Admission (Current) from 04/28/2023 in [facility]IORAL MEDICINE ECT Treatment from 08/03/2020 in [facility]ERY Admission (Discharged) from 07/22/2020 in [facility] 1C MEDICAL TELEMETRY  C-SSRS RISK CATEGORY No Risk No Risk Error: Question 2 not populated       Last [facility] 2/9 Scores:     No data to display          Scribe for Treatment Team: Glenis Smoker, LCSW 05/01/2023 10:04 AM

## 2023-05-01 NOTE — BHH Counselor (Signed)
Patient provided verbal permission for CSW to speak with mother.  Patient declined to sign the consent forms at this time time stating "Not right now".   Patient also indicated that she would like to begin aftercare with the [facility] in [facility].  Again patient sated that she was unable to sign consent at this time.  [name], MSW, LCSW 05/01/2023 11:15 AM

## 2023-05-01 NOTE — BH Assessment (Signed)
CSW spoke with the patient's mother Isabella [name] [phone].  Spanish interpreter is needed.  CSW utilized interpreter service [id number].  Mother reports that there are no weapons in the home.  She reports that she does fear the patient sometimes.   Mother reports that patient is a danger to herself at current state as she can not care for herself "she wasn't eating, bathing, dressing".   Mother reports that patient was living with her and pt's father.  She reports that patient CAN return at discharge.  Mother reports that patient had been staying on the street prior to her incarceration.  She reports that once released patient came to her home and was home only 2 days before being taken to the hospital.  She reports that patient was admitted for "Schizophrenia, Bipolar,  and drinking".  Mother reports that patient has a hx of this behavior and had ECT in the past and that was successful.   Mother reports that pt had been incarcerated since August 2024.  Mother reports that she would like for patient to go to a long-term rehab for mental health and alcohol use. Mother reports that the patient is medication noncompliant stating patient often says "I am not sick I don't need the medication. You take it."  Mother reports that when patient is told to take her medication pt does well, "the last time she didn't she was saying that she wanted to kill me".  Mother reports that she does not know where the patient is located.  CSW explained that patient has been on the BMU since 04/28/2023 and provided the contact information and explained the code process.  She wanted to know if the patient has been eating  [name], MSW, LCSW 05/01/2023 4:21 PM

## 2023-05-01 NOTE — BHH Counselor (Signed)
Adult Comprehensive Assessment  Patient ID: Isabella [name], female   DOB: 1975/12/13, 47 y.o.   MRN: [id number]  Information Source: Information source: Patient (CSW notes that patient was selectively mute at time and was unable or unwilling to answer.)  Current Stressors:  Patient states their primary concerns and needs for treatment are:: "depression" Patient states their goals for this hospitilization and ongoing recovery are:: "I don't know" Educational / Learning stressors: Unable to assess. Employment / Job issues: Unable to assess. Family Relationships: Unable to assess. Financial / Lack of resources (include bankruptcy): Unable to assess. Housing / Lack of housing: Unable to assess. Physical health (include injuries & life threatening diseases): Unable to assess. Social relationships: Unable to assess. Substance abuse: Unable to assess. Bereavement / Loss: Unable to assess.  Living/Environment/Situation:  Living Arrangements: Other (Comment) (Pt reports that she lives with family, she was unable to provide further detail as to who.) Living conditions (as described by patient or guardian): Unable to assess. Who else lives in the home?: Unable to assess. How long has patient lived in current situation?: Unable to assess.  Family History:  Marital status: Single What is your sexual orientation?: Unable to assess. Has your sexual activity been affected by drugs, alcohol, medication, or emotional stress?: Unable to assess. Does patient have children?:  (Unable to assess.) How is patient's relationship with their children?: Unable to assess.  Childhood History:  By whom was/is the patient raised?: Other (Comment) (Unable to assess.) Additional childhood history information: Unable to assess. Description of patient's relationship with caregiver when they were a child: Unable to assess. Patient's description of current relationship with people who raised him/her: Unable  to assess. How were you disciplined when you got in trouble as a child/adolescent?: Unable to assess. Does patient have siblings?:  (Unable to assess.) Did patient suffer any verbal/emotional/physical/sexual abuse as a child?: No Did patient suffer from severe childhood neglect?: No Has patient ever been sexually abused/assaulted/raped as an adolescent or adult?: No Was the patient ever a victim of a crime or a disaster?: No Witnessed domestic violence?: No Has patient been affected by domestic violence as an adult?: No  Education:  Highest grade of school patient has completed: Unable to assess. Currently a student?:  (Unable to assess.) Learning disability?:  (Unable to assess.)  Employment/Work Situation:   Employment Situation: On disability Why is Patient on Disability: Unable to assess. How Long has Patient Been on Disability: Unable to assess. What is the Longest Time Patient has Held a Job?: Unable to assess. Where was the Patient Employed at that Time?: Unable to assess. Has Patient ever Been in the [employer]?: No  Financial Resources:   Financial resources: [employer], [place] Does patient have a Lawyer or guardian?: No  Alcohol/Substance Abuse:   What has been your use of drugs/alcohol within the last 12 months?: Unable to assess. If attempted suicide, did drugs/alcohol play a role in this?:  (Unable to assess.) Alcohol/Substance Abuse Treatment Hx:  (Unable to assess.) Has alcohol/substance abuse ever caused legal problems?:  (Unable to assess.)  Social Support System:   Patient's Community Support System:  (Unable to assess.) Describe Community Support System: Unable to assess. Type of faith/religion: Unable to assess. How does patient's faith help to cope with current illness?: Unable to assess.  Leisure/Recreation:      Strengths/Needs:   What is the patient's perception of their strengths?: Unable to assess. Patient states they can use  these personal strengths during their treatment  to contribute to their recovery: Unable to assess. Patient states these barriers may affect/interfere with their treatment: Unable to assess. Patient states these barriers may affect their return to the community: Unable to assess. Other important information patient would like considered in planning for their treatment: Unable to assess.  Discharge Plan:   Currently receiving community mental health services: No Patient states concerns and preferences for aftercare planning are: Pt reports that she does not have a current provider and is open to a referral at discharge. Patient states they will know when they are safe and ready for discharge when: "I don't know" Does patient have access to transportation?:  (Unable to assess.) Does patient have financial barriers related to discharge medications?: No Will patient be returning to same living situation after discharge?: Yes  Summary/Recommendations:   Summary and Recommendations (to be completed by the evaluator): Patient is a 47 year old female from [place] [facility] [place]  She presents to the hospital for concerns of "acting erratic and tearing up the house".  Patient was limited in participating in this assessment.  She was able to provide yes or no responses and shake her head and gave a few verbal responses but did not disclose much information. However, the answers provided are the one patient provided.  Information gathered from initial assessment indicate that patient was released from jail two days prior to her admission.  Initial assessment also reports that patient has a history of Schizophrenia, Bipolar Disorder and a psychotic break.  It is unclear if patient received her medication while she was incarcerated.  Patient reported that she is not current with her mental health provider, however, wanted to resume services with the [facility].  Recommendations include:  crisis stabilization, therapeutic milieu, encourage group attendance and participation, medication management for detox/mood stabilization and development of comprehensive mental wellness/sobriety plan.  Harden Mo. 05/01/2023

## 2023-05-01 NOTE — Group Note (Signed)
Recreation Therapy Group Note   Group Topic:Coping Skills  Group Date: 05/01/2023 Start Time: 1000 End Time: 1100 Facilitators: Rosina Lowenstein, LRT, CTRS Location:  Craft Room  Group Description: Mind Map.  Patient was provided a blank template of a diagram with 32 blank boxes in a tiered system, branching from the center (similar to a bubble chart). LRT directed patients to label the middle of the diagram "Coping Skills". LRT and patients then came up with 8 different coping skills as examples. Pt were directed to record their coping skills in the 2nd tier boxes closest to the center.  Patients would then share their coping skills with the group as LRT wrote them out. LRT gave a handout of 99 different coping skills at the end of group.   Goal Area(s) Addressed: Patients will be able to define "coping skills". Patient will identify new coping skills.  Patient will increase communication.   Affect/Mood: N/A   Participation Level: Did not attend    Clinical Observations/Individualized Feedback: Patient did not attend group.   Plan: Continue to engage patient in RT group sessions 2-3x/week.   Rosina Lowenstein, LRT, CTRS 05/01/2023 1:15 PM

## 2023-05-01 NOTE — BHH Suicide Risk Assessment (Signed)
BHH INPATIENT:  Family/Significant Other Suicide Prevention Education  Suicide Prevention Education:  Education Completed; Rosamaria Lints [phone].  Spanish interpreter is needed.  CSW utilized interpreter service [id number] has been identified by the patient as the family member/significant other with whom the patient will be residing, and identified as the person(s) who will aid the patient in the event of a mental health crisis (suicidal ideations/suicide attempt).  With written consent from the patient, the family member/significant other has been provided the following suicide prevention education, prior to the and/or following the discharge of the patient.  The suicide prevention education provided includes the following: Suicide risk factors Suicide prevention and interventions National Suicide Hotline telephone number [facility] assessment telephone number [facility] Emergency Assistance 911 [facility] and/or Residential Mobile Crisis Unit telephone number  Request made of family/significant other to: Remove weapons (e.g., guns, rifles, knives), all items previously/currently identified as safety concern.   Remove drugs/medications (over-the-counter, prescriptions, illicit drugs), all items previously/currently identified as a safety concern.  The family member/significant other verbalizes understanding of the suicide prevention education information provided.  The family member/significant other agrees to remove the items of safety concern listed above.  Harden Mo 05/01/2023, 4:13 PM

## 2023-05-01 NOTE — Plan of Care (Signed)
Pt mute with staff and isolative to her room  Problem: Education: Goal: Knowledge of [facility] Education information/materials will improve Outcome: Not Progressing Goal: Emotional status will improve Outcome: Not Progressing Goal: Mental status will improve Outcome: Not Progressing Goal: Verbalization of understanding the information provided will improve Outcome: Not Progressing   Problem: Activity: Goal: Interest or engagement in activities will improve Outcome: Not Progressing Goal: Sleeping patterns will improve Outcome: Not Progressing   Problem: Coping: Goal: Ability to verbalize frustrations and anger appropriately will improve Outcome: Not Progressing Goal: Ability to demonstrate self-control will improve Outcome: Not Progressing   Problem: Health Behavior/Discharge Planning: Goal: Identification of resources available to assist in meeting health care needs will improve Outcome: Not Progressing Goal: Compliance with treatment plan for underlying cause of condition will improve Outcome: Not Progressing   Problem: Physical Regulation: Goal: Ability to maintain clinical measurements within normal limits will improve Outcome: Not Progressing   Problem: Safety: Goal: Periods of time without injury will increase Outcome: Not Progressing   Problem: Education: Goal: Utilization of techniques to improve thought processes will improve Outcome: Not Progressing Goal: Knowledge of the prescribed therapeutic regimen will improve Outcome: Not Progressing   Problem: Coping: Goal: Coping ability will improve Outcome: Not Progressing Goal: Will verbalize feelings Outcome: Not Progressing

## 2023-05-01 NOTE — Group Note (Signed)
Recreation Therapy Group Note   Group Topic:General Recreation  Group Date: 05/01/2023 Start Time: 1530 End Time: 1645 Facilitators: Rosina Lowenstein, LRT, CTRS Location:  Dayroom  Group Description: Recreation. Patients were given the opportunity to play cards, journal, or listen to music during group. Pt identified and conversated about things they enjoy doing in their free time and how they can continue to do that outside of the hospital.  Goal Area(s) Addressed: Patient will practice making a positive decision. Patient will have the opportunity to try a new leisure activity. Patient will communicate with peers and LRT.   Affect/Mood: N/A   Participation Level: Did not attend    Clinical Observations/Individualized Feedback: Leellen did not attend group.   Plan: Continue to engage patient in RT group sessions 2-3x/week.   Rosina Lowenstein, LRT, CTRS 05/01/2023 5:29 PM

## 2023-05-01 NOTE — Progress Notes (Signed)
Pt presents with a better affect than when this writer had this patient last night. Pt shook her head no when this writer asked if she was having any SI. This Clinical research associate held the pts sleep medication tonight. Pt given education, support, and encouragement to be active in her treatment plan. Pt being monitored Q 15 minutes for safety per unit protocol, remains safe on the unit

## 2023-05-01 NOTE — Group Note (Signed)
[employer] LCSW Group Therapy Note    Group Date: 05/01/2023 Start Time: 1315 End Time: 1415  Type of Therapy and Topic:  Group Therapy:  Overcoming Obstacles  Participation Level:  [facility] PARTICIPATION LEVEL: Did Not Attend  Mood:  Description of Group:   In this group patients will be encouraged to explore what they see as obstacles to their own wellness and recovery. They will be guided to discuss their thoughts, feelings, and behaviors related to these obstacles. The group will process together ways to cope with barriers, with attention given to specific choices patients can make. Each patient will be challenged to identify changes they are motivated to make in order to overcome their obstacles. This group will be process-oriented, with patients participating in exploration of their own experiences as well as giving and receiving support and challenge from other group members.  Therapeutic Goals: 1. Patient will identify personal and current obstacles as they relate to admission. 2. Patient will identify barriers that currently interfere with their wellness or overcoming obstacles.  3. Patient will identify feelings, thought process and behaviors related to these barriers. 4. Patient will identify two changes they are willing to make to overcome these obstacles:    Summary of Patient Progress   X   Therapeutic Modalities:   Cognitive Behavioral Therapy Solution Focused Therapy Motivational Interviewing Relapse Prevention Therapy   Harden Mo, LCSW
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
## 2023-05-01 NOTE — Plan of Care (Signed)
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 Patient denies SI,HI and AVH. Patient compliant with medications and meals today. Support and encouragement given.

## 2023-05-01 NOTE — Group Note (Signed)
Date:  05/01/2023 Time:  9:54 PM  Group Topic/Focus:  Wrap-Up Group:   The focus of this group is to help patients review their daily goal of treatment and discuss progress on daily workbooks.    Participation Level:  Did Not Attend   Katina Dung 05/01/2023, 9:54 PM

## 2023-05-02 DIAGNOSIS — F333 Major depressive disorder, recurrent, severe with psychotic symptoms: Secondary | ICD-10-CM | POA: Diagnosis not present

## 2023-05-02 LAB — LIPID PANEL
Cholesterol: 193 mg/dL (ref 0–200)
HDL: 39 mg/dL — ABNORMAL LOW (ref >40)
LDL Cholesterol: 132 mg/dL — ABNORMAL HIGH (ref 0–99)
Total CHOL/HDL Ratio: 4.9 {ratio}
Triglycerides: 110 mg/dL (ref <150)
VLDL: 22 mg/dL (ref 0–40)

## 2023-05-02 MED ORDER — ARIPIPRAZOLE 10 MG PO TABS
10.0000 mg | ORAL_TABLET | Freq: Every day | ORAL | Status: DC
  Administered 2023-05-03: 10 mg via ORAL
  Filled 2023-05-02: qty 1

## 2023-05-02 NOTE — Group Note (Signed)
Recreation Therapy Group Note   Group Topic:Goal Setting  Group Date: 05/02/2023 Start Time: 1000 End Time: 1100 Facilitators: Rosina Lowenstein, LRT, CTRS Location:  Craft Room  Group Description: Product/process development scientist. Patients were given many different magazines, a glue stick, markers, and a piece of cardstock paper. LRT and pts discussed the importance of having goals in life. LRT and pts discussed the difference between short-term and long-term goals, as well as what a SMART goal is. LRT encouraged pts to create a vision board, with images they picked and then cut out with safety scissors from the magazine, for themselves, that capture their short and long-term goals. LRT encouraged pts to show and explain their vision board to the group.   Goal Area(s) Addressed:  Patient will gain knowledge of short vs. long term goals.  Patient will identify goals for themselves. Patient will practice setting SMART goals. Patient will verbalize their goals to LRT and peers.   Affect/Mood: N/A   Participation Level: Did not attend    Clinical Observations/Individualized Feedback: Patient did not attend group.  Plan: Continue to engage patient in RT group sessions 2-3x/week.   Rosina Lowenstein, LRT, CTRS 05/02/2023 1:13 PM

## 2023-05-02 NOTE — Group Note (Signed)
Date:  05/02/2023 Time:  3:37 PM  Group Topic/Focus:  Activity Group:  The focus of the group is to promote activity for the patients to encourage them to go outside to the courtyard for some fresh air and some exercise.    Participation Level:  Did Not Attend   Mary Sella Fabiana Dromgoole 05/02/2023, 3:37 PM

## 2023-05-02 NOTE — Progress Notes (Signed)
   05/02/23 1000  Psych Admission Type (Psych Patients Only)  Admission Status Involuntary  Psychosocial Assessment  Patient Complaints Anxiety;Depression  Eye Contact Fair  Facial Expression Sullen  Affect Sullen  Speech Soft  Interaction Forwards little  Motor Activity Slow  Appearance/Hygiene Body odor;Disheveled;Poor hygiene;In scrubs  Behavior Characteristics Cooperative;Appropriate to situation  Mood Sullen;Pleasant  Aggressive Behavior  Effect No apparent injury  Thought Process  Coherency WDL  Content WDL  Delusions None reported or observed  Perception WDL  Hallucination None reported or observed  Judgment Impaired  Confusion None  Danger to Self  Current suicidal ideation? Denies  Agreement Not to Harm Self Yes  Description of Agreement Verbal  Danger to Others  Danger to Others None reported or observed
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
## 2023-05-02 NOTE — Group Note (Signed)
Date:  05/02/2023 Time:  11:06 PM  Group Topic/Focus:  Wrap-Up Group:   The focus of this group is to help patients review their daily goal of treatment and discuss progress on daily workbooks.    Participation Level:  Did Not Attend   Katina Dung 05/02/2023, 11:06 PM

## 2023-05-02 NOTE — Plan of Care (Signed)
Pt more active on the unit, denies SI/HI/AVH  Problem: Education: Goal: Knowledge of [facility] Education information/materials will improve Outcome: Progressing Goal: Emotional status will improve Outcome: Progressing Goal: Mental status will improve Outcome: Progressing Goal: Verbalization of understanding the information provided will improve Outcome: Progressing   Problem: Activity: Goal: Interest or engagement in activities will improve Outcome: Progressing Goal: Sleeping patterns will improve Outcome: Progressing   Problem: Coping: Goal: Ability to verbalize frustrations and anger appropriately will improve Outcome: Progressing Goal: Ability to demonstrate self-control will improve Outcome: Progressing   Problem: Health Behavior/Discharge Planning: Goal: Identification of resources available to assist in meeting health care needs will improve Outcome: Progressing Goal: Compliance with treatment plan for underlying cause of condition will improve Outcome: Progressing   Problem: Physical Regulation: Goal: Ability to maintain clinical measurements within normal limits will improve Outcome: Progressing   Problem: Safety: Goal: Periods of time without injury will increase Outcome: Progressing   Problem: Education: Goal: Utilization of techniques to improve thought processes will improve Outcome: Progressing Goal: Knowledge of the prescribed therapeutic regimen will improve Outcome: Progressing   Problem: Coping: Goal: Coping ability will improve Outcome: Progressing Goal: Will verbalize feelings Outcome: Progressing

## 2023-05-02 NOTE — Group Note (Signed)
Recreation Therapy Group Note   Group Topic:Other  Group Date: 05/02/2023 Start Time: 1530 End Time: 1630 Facilitators: Clinton Gallant, CTRS Location:  C[facility]  LRT, nurses, social workers, Facilities manager, and patients got together for [employer]. LRT provided Christmas theme coloring pages, as well. Everyone voluntarily took turns singing and talking about their holiday traditions.    Affect/Mood: N/A   Participation Level: Did not attend    Clinical Observations/Individualized Feedback: Patient did not attend group.   Plan: Continue to engage patient in RT group sessions 2-3x/week.   Isabella [name], LRT, CTRS 05/02/2023 5:54 PM

## 2023-05-02 NOTE — Progress Notes (Signed)
Pt presents with a better affect than when this writer had this patient last night. Pt shook her head no when this writer asked if she was having any SI. This Clinical research associate held the pts sleep medication tonight. Pt given education, support, and encouragement to be active in her treatment plan. Pt being monitored Q 15 minutes for safety per unit protocol, remains safe on the unit

## 2023-05-02 NOTE — Plan of Care (Signed)
  Problem: Education: Goal: Knowledge of [facility] Education information/materials will improve Outcome: Progressing Goal: Emotional status will improve Outcome: Progressing Goal: Mental status will improve Outcome: Progressing Goal: Verbalization of understanding the information provided will improve Outcome: Progressing   Problem: Activity: Goal: Interest or engagement in activities will improve Outcome: Progressing Goal: Sleeping patterns will improve Outcome: Progressing   Problem: Coping: Goal: Ability to verbalize frustrations and anger appropriately will improve Outcome: Progressing Goal: Ability to demonstrate self-control will improve Outcome: Progressing   Problem: Health Behavior/Discharge Planning: Goal: Identification of resources available to assist in meeting health care needs will improve Outcome: Progressing Goal: Compliance with treatment plan for underlying cause of condition will improve Outcome: Progressing   Problem: Physical Regulation: Goal: Ability to maintain clinical measurements within normal limits will improve Outcome: Progressing   Problem: Safety: Goal: Periods of time without injury will increase Outcome: Progressing   Problem: Education: Goal: Utilization of techniques to improve thought processes will improve Outcome: Progressing Goal: Knowledge of the prescribed therapeutic regimen will improve Outcome: Progressing   Problem: Coping: Goal: Coping ability will improve Outcome: Progressing Goal: Will verbalize feelings Outcome: Progressing

## 2023-05-02 NOTE — Group Note (Signed)
LCSW Group Therapy Note   Group Date: 05/02/2023 Start Time: 1300 End Time: 1405   Type of Therapy and Topic:  Group Therapy: Challenging Core Beliefs  Participation Level:  Did Not Attend  Description of Group:  Patients were educated about core beliefs and asked to identify one harmful core belief that they have. Patients were asked to explore from where those beliefs originate. Patients were asked to discuss how those beliefs make them feel and the resulting behaviors of those beliefs. They were then be asked if those beliefs are true and, if so, what evidence they have to support them. Lastly, group members were challenged to replace those negative core beliefs with helpful beliefs.   Therapeutic Goals:   1. Patient will identify harmful core beliefs and explore the origins of such beliefs. 2. Patient will identify feelings and behaviors that result from those core beliefs. 3. Patient will discuss whether such beliefs are true. 4.  Patient will replace harmful core beliefs with helpful ones.  Summary of Patient Progress:  Patient did not attend.   Therapeutic Modalities: Cognitive Behavioral Therapy; Solution-Focused Therapy   Perrin Smack 05/02/2023  2:43 PM

## 2023-05-03 DIAGNOSIS — F202 Catatonic schizophrenia: Secondary | ICD-10-CM

## 2023-05-03 LAB — HEMOGLOBIN A1C
Hgb A1c MFr Bld: 5.2 % (ref 4.8–5.6)
Mean Plasma Glucose: 102.54 mg/dL

## 2023-05-03 MED ORDER — ARIPIPRAZOLE 5 MG PO TABS
15.0000 mg | ORAL_TABLET | Freq: Every day | ORAL | Status: DC
  Administered 2023-05-04 – 2023-05-07 (×4): 15 mg via ORAL
  Filled 2023-05-03 (×4): qty 1

## 2023-05-03 MED ORDER — HYDROXYZINE HCL 25 MG PO TABS
25.0000 mg | ORAL_TABLET | Freq: Four times a day (QID) | ORAL | Status: DC | PRN
  Administered 2023-05-06: 25 mg via ORAL

## 2023-05-03 NOTE — Group Note (Signed)
Date:  05/03/2023 Time:  9:59 AM  Group Topic/Focus:  Goals Group:   The focus of this group is to help patients establish daily goals to achieve during treatment and discuss how the patient can incorporate goal setting into their daily lives to aide in recovery.    Participation Level:  Did Not Attend   Lynelle Smoke [facility] 05/03/2023, 9:59 AM

## 2023-05-03 NOTE — Group Note (Signed)
[facility] LCSW Group Therapy Note   Group Date: 05/03/2023 Start Time: 1300 End Time: 1350   Type of Therapy/Topic:  Group Therapy:  Emotion Regulation  Participation Level:  Did Not Attend   Description of Group:    The purpose of this group is to assist patients in learning to regulate negative emotions and experience positive emotions. Patients will be guided to discuss ways in which they have been vulnerable to their negative emotions. These vulnerabilities will be juxtaposed with experiences of positive emotions or situations, and patients challenged to use positive emotions to combat negative ones. Special emphasis will be placed on coping with negative emotions in conflict situations, and patients will process healthy conflict resolution skills.  Therapeutic Goals: Patient will identify two positive emotions or experiences to reflect on in order to balance out negative emotions:  Patient will label two or more emotions that they find the most difficult to experience:  Patient will be able to demonstrate positive conflict resolution skills through discussion or role plays:   Summary of Patient Progress: X   Therapeutic Modalities:   Cognitive Behavioral Therapy Feelings Identification Dialectical Behavioral Therapy   Glenis Smoker, LCSW

## 2023-05-03 NOTE — Progress Notes (Signed)
Patient stated that she needed a minute to come up and take scheduled medication. Staff will attempt again to administer medication later.

## 2023-05-03 NOTE — Progress Notes (Signed)
   05/03/23 1500  Psych Admission Type (Psych Patients Only)  Admission Status Involuntary  Psychosocial Assessment  Patient Complaints Anxiety;Depression (per patient "a little bit", but when asked why she's feeling this way, she said "I don't know".)  Eye Contact Fair  Facial Expression Sullen  Affect Sullen  Speech Soft  Interaction Forwards little;No initiation  Motor Activity Slow  Appearance/Hygiene Body odor;Poor hygiene;In scrubs  Behavior Characteristics Resistant to care;Cooperative;Appropriate to situation (patient initially resistant to take medication, until this afternoon.)  Mood Sullen;Pleasant  Aggressive Behavior  Effect No apparent injury  Thought Process  Coherency WDL  Content WDL  Delusions None reported or observed  Perception WDL  Hallucination None reported or observed  Judgment WDL  Confusion None  Danger to Self  Current suicidal ideation? Denies  Agreement Not to Harm Self Yes  Description of Agreement Verbal  Danger to Others  Danger to Others None reported or observed

## 2023-05-03 NOTE — Progress Notes (Signed)
Patient was observed talking on the phone, after dinner. Patient skipped lunch, but did eat breakfast and dinner today. Patient also asked this Clinical research associate for self-hygiene products and shower supplies.

## 2023-05-03 NOTE — Progress Notes (Signed)
Isolative to self and room. Medication compliant. Appropriate with staff and peers. Denies SI, HI, AVH. Voiced no complaints or concerns. Flat, sad affect. Answered questions, but forwards minimal. Encouragement and support provided. Safety checks maintained. Medications given as prescribed. Pt receptive and remains safe on unit with q 15 min checks.

## 2023-05-03 NOTE — Plan of Care (Signed)
Pt denies SI/HI/AVH, compliant with procedures on the unit  Problem: Education: Goal: Knowledge of [facility] Education information/materials will improve Outcome: Progressing Goal: Emotional status will improve Outcome: Progressing Goal: Mental status will improve Outcome: Progressing Goal: Verbalization of understanding the information provided will improve Outcome: Progressing   Problem: Activity: Goal: Interest or engagement in activities will improve Outcome: Progressing Goal: Sleeping patterns will improve Outcome: Progressing   Problem: Coping: Goal: Ability to verbalize frustrations and anger appropriately will improve Outcome: Progressing Goal: Ability to demonstrate self-control will improve Outcome: Progressing   Problem: Health Behavior/Discharge Planning: Goal: Identification of resources available to assist in meeting health care needs will improve Outcome: Progressing Goal: Compliance with treatment plan for underlying cause of condition will improve Outcome: Progressing   Problem: Physical Regulation: Goal: Ability to maintain clinical measurements within normal limits will improve Outcome: Progressing   Problem: Safety: Goal: Periods of time without injury will increase Outcome: Progressing   Problem: Education: Goal: Utilization of techniques to improve thought processes will improve Outcome: Progressing Goal: Knowledge of the prescribed therapeutic regimen will improve Outcome: Progressing   Problem: Coping: Goal: Coping ability will improve Outcome: Progressing Goal: Will verbalize feelings Outcome: Progressing
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
## 2023-05-03 NOTE — Plan of Care (Signed)
  Problem: Education: Goal: Knowledge of [facility] Education information/materials will improve Outcome: Progressing Goal: Emotional status will improve Outcome: Progressing Goal: Mental status will improve Outcome: Progressing Goal: Verbalization of understanding the information provided will improve Outcome: Progressing   Problem: Safety: Goal: Periods of time without injury will increase Outcome: Progressing   

## 2023-05-03 NOTE — Progress Notes (Signed)
After consulting with NP, scheduled 1700 Ativan was moved to bedtime. Oncoming shift will be notified of this change.

## 2023-05-03 NOTE — Plan of Care (Signed)
  Problem: Education: Goal: Knowledge of [facility] Education information/materials will improve Outcome: Progressing Goal: Emotional status will improve Outcome: Progressing Goal: Mental status will improve Outcome: Progressing Goal: Verbalization of understanding the information provided will improve Outcome: Progressing   Problem: Activity: Goal: Interest or engagement in activities will improve Outcome: Progressing Goal: Sleeping patterns will improve Outcome: Progressing   Problem: Coping: Goal: Ability to verbalize frustrations and anger appropriately will improve Outcome: Progressing Goal: Ability to demonstrate self-control will improve Outcome: Progressing   Problem: Health Behavior/Discharge Planning: Goal: Identification of resources available to assist in meeting health care needs will improve Outcome: Progressing Goal: Compliance with treatment plan for underlying cause of condition will improve Outcome: Progressing   Problem: Physical Regulation: Goal: Ability to maintain clinical measurements within normal limits will improve Outcome: Progressing   Problem: Safety: Goal: Periods of time without injury will increase Outcome: Progressing   Problem: Education: Goal: Utilization of techniques to improve thought processes will improve Outcome: Progressing Goal: Knowledge of the prescribed therapeutic regimen will improve Outcome: Progressing   Problem: Coping: Goal: Coping ability will improve Outcome: Progressing Goal: Will verbalize feelings Outcome: Progressing

## 2023-05-03 NOTE — Group Note (Signed)
Date:  05/03/2023 Time:  9:35 PM  Group Topic/Focus:  Managing Feelings:   The focus of this group is to identify what feelings patients have difficulty handling and develop a plan to handle them in a healthier way upon discharge.    Participation Level:  Did Not Attend  Participation Quality:   none  Affect:   none  Cognitive:   none  Insight: None  Engagement in Group:   none  Modes of Intervention:   none  Additional Comments:  none   Derrick Orris 05/03/2023, 9:35 PM

## 2023-05-04 DIAGNOSIS — F202 Catatonic schizophrenia: Secondary | ICD-10-CM | POA: Diagnosis not present

## 2023-05-04 NOTE — Group Note (Signed)
Recreation Therapy Group Note   Group Topic:Coping Skills  Group Date: 05/04/2023 Start Time: 1530 End Time: 1620 Facilitators: Clinton Gallant, CTRS Location:  Craft Room  Group Description: Coping A-Z. LRT and patients engage in a guided discussion on what coping skills are and gave specific examples. LRT passed out a handout labeled Coping A-Z with blank spaces beside each letter. LRT prompted patients to come up with a coping skill for each of the letters. LRT and patients went over the handout and gave ideas for each letter if anyone had any blanks left on their paper. Patients kept this handout with them that listed 26 different coping skills.   Goal Area(s) Addressed: Patients will be able to define "coping skills". Patient will identify new coping skills.  Patient will increase communication.   Affect/Mood: N/A   Participation Level: Did not attend    Clinical Observations/Individualized Feedback: Patient did not attend group.   Plan: Continue to engage patient in RT group sessions 2-3x/week.   Rosina Lowenstein, LRT, CTRS 05/04/2023 5:21 PM

## 2023-05-04 NOTE — Group Note (Signed)
Date:  05/04/2023 Time:  3:47 PM  Group Topic/Focus:  Activity Group:  The focus of the group is to promote activity for the patients and encourage them to come out to the courtyard and get some exercise and fresh air.    Participation Level:  Did Not Attend   Mary Sella Lyon Dumont 05/04/2023, 3:47 PM

## 2023-05-04 NOTE — Group Note (Signed)
Date:  05/04/2023 Time:  9:16 PM  Group Topic/Focus:  Identifying Needs:   The focus of this group is to help patients identify their personal needs that have been historically problematic and identify healthy behaviors to address their needs.    Participation Level:  Did Not Attend  Participation Quality:   none  Affect:   none  Cognitive:   none  Insight: None  Engagement in Group:   none  Modes of Intervention:   none  Additional Comments:  none   Estelle Skibicki 05/04/2023, 9:16 PM

## 2023-05-04 NOTE — Group Note (Signed)
Date:  05/04/2023 Time:  10:52 AM  Group Topic/Focus:  Goals Group:   The focus of this group is to help patients establish daily goals to achieve during treatment and discuss how the patient can incorporate goal setting into their daily lives to aide in recovery.    Participation Level:  Did Not Attend   Lynelle Smoke [facility] 05/04/2023, 10:52 AM

## 2023-05-04 NOTE — Plan of Care (Signed)
Patient stated that he is feeling better. Patient out in the milieu for meals. Affect is brighter on approach. Denies SI,HI and AVH. Appetite and energy level good. Did not attend groups. Support and encouragement given.
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
## 2023-05-04 NOTE — Group Note (Signed)
Recreation Therapy Group Note   Group Topic:Relaxation  Group Date: 05/04/2023 Start Time: 1000 End Time: 1045 Facilitators: Rosina Lowenstein, LRT, CTRS Location:  Craft Room  Group Description: PMR (Progressive Muscle Relaxation). LRT asks patients their current level of stress/anxiety from 1-10, with 10 being the highest. LRT educates patients on what PMR is and the benefits that come from it. Patients are asked to sit with their feet flat on the floor while sitting up and all the way back in their chair, if possible. LRT and pts follow a prompt through a speaker that requires you to tense and release different muscles in their body and focus on their breathing. During session, lights are off and soft music is being played. Pts are given a stress ball to use if needed. At the end of the prompt, LRT asks patients to rank their current levels of stress/anxiety from 1-10, 10 being the highest. LRT provides patients with an education handout on PMR.   Goal Area(s) Addressed:  Patients will be able to describe progressive muscle relaxation.  Patient will practice using relaxation technique. Patient will identify a new coping skill.  Patient will follow multistep directions to reduce anxiety and stress.   Affect/Mood: N/A   Participation Level: Did not attend    Clinical Observations/Individualized Feedback: Patient did not attend group.   Plan: Continue to engage patient in RT group sessions 2-3x/week.   Rosina Lowenstein, LRT, CTRS 05/04/2023 11:48 AM

## 2023-05-05 DIAGNOSIS — F202 Catatonic schizophrenia: Secondary | ICD-10-CM | POA: Diagnosis not present

## 2023-05-05 NOTE — Progress Notes (Addendum)
Patient denies SI,HI and AVH. Patient refused bed time medication.Stayed in room.   05/05/23 0300  Psych Admission Type (Psych Patients Only)  Admission Status Involuntary  Psychosocial Assessment  Patient Complaints Anxiety  Eye Contact Fair  Facial Expression Sullen  Affect Appropriate to circumstance  Speech Soft  Interaction No initiation  Motor Activity Slow  Appearance/Hygiene Poor hygiene  Behavior Characteristics Cooperative  Mood Sullen  Aggressive Behavior  Effect No apparent injury  Thought Process  Coherency WDL  Content WDL  Delusions None reported or observed  Perception WDL  Hallucination None reported or observed  Judgment Impaired  Confusion None  Danger to Self  Current suicidal ideation? Denies  Danger to Others  Danger to Others None reported or observed

## 2023-05-05 NOTE — Group Note (Signed)
Date:  05/05/2023 Time:  8:45 PM  Group Topic/Focus:  Coping With Mental Health Crisis:   The purpose of this group is to help patients identify strategies for coping with mental health crisis.  Group discusses possible causes of crisis and ways to manage them effectively.    Participation Level:  Did Not Attend   Lenore Cordia 05/05/2023, 8:45 PM

## 2023-05-05 NOTE — Group Note (Signed)
Date:  05/05/2023 Time:  10:14 AM  Group Topic/Focus:  Relapse Prevention Planning:   The focus of this group is to define relapse and discuss the need for planning to combat relapse.    Participation Level:  Did Not Attend   Isabella [name] 05/05/2023, 10:14 AM

## 2023-05-05 NOTE — Progress Notes (Signed)
Isolative to room, no interaction with peers. Did not attend group. Minimal interaction with staff. Does answer questions when asked. Denies SI, HI, AVh. Medication given as prescribed. Voiced no concerns or complaints.  Encouragement and support provided. Safety checks maintained. Medications given as prescribed. Pt receptive and remains safe on unit with q15 min checks.

## 2023-05-05 NOTE — Plan of Care (Signed)
  Problem: Education: Goal: Knowledge of [place] [facility] Education information/materials will improve Outcome: Progressing   Problem: Activity: Goal: Interest or engagement in activities will improve Outcome: Progressing Goal: Sleeping patterns will improve Outcome: Progressing   

## 2023-05-05 NOTE — Plan of Care (Signed)
Patient pleasant and cooperative on approach. Stated that she is feeling better.Patient denies SI,HI and AVH. Compliant with medications. Appetite and energy level good. Support and encouragement given.

## 2023-05-05 NOTE — Plan of Care (Signed)
  Problem: Education: Goal: Emotional status will improve Outcome: Progressing Goal: Mental status will improve Outcome: Progressing   Problem: Coping: Goal: Ability to verbalize frustrations and anger appropriately will improve Outcome: Progressing   
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
## 2023-05-05 NOTE — Group Note (Signed)
Recreation Therapy Group Note   Group Topic:Leisure Education  Group Date: 05/05/2023 Start Time: 1000 End Time: 1100 Facilitators: Rosina Lowenstein, LRT, CTRS Location:  Craft Room  Group Description: Leisure. Patients were given the option to choose from singing karaoke, coloring mandalas, using oil pastels, journaling, or playing with play-doh. LRT and pts discussed the meaning of leisure, the importance of participating in leisure during their free time/when they're outside of the hospital, as well as how our leisure interests can also serve as coping skills.   Goal Area(s) Addressed:  Patient will identify a current leisure interest.  Patient will learn the definition of "leisure". Patient will practice making a positive decision. Patient will have the opportunity to try a new leisure activity. Patient will communicate with peers and LRT.    Affect/Mood: N/A   Participation Level: Did not attend    Clinical Observations/Individualized Feedback: Patient did not attend group.   Plan: Continue to engage patient in RT group sessions 2-3x/week.   Rosina Lowenstein, LRT, CTRS 05/05/2023 12:27 PM

## 2023-05-05 NOTE — Group Note (Signed)
Recreation Therapy Group Note   Group Topic:Health and Wellness  Group Date: 05/05/2023 Start Time: 1530 End Time: 1615 Facilitators: Rosina Lowenstein, LRT, CTRS Location: Courtyard  Group Description: [employer]. LRT and patients played games of basketball, drew with chalk, and played corn hole while outside in the courtyard while getting fresh air and sunlight. Music was being played in the background. LRT and peers conversed about different games they have played before, what they do in their free time and anything else that is on their minds. LRT encouraged pts to drink water after being outside, sweating and getting their heart rate up.  Goal Area(s) Addressed: Patient will build on frustration tolerance skills. Patients will partake in a competitive play game with peers. Patients will gain knowledge of new leisure interest/hobby.    Affect/Mood: N/A   Participation Level: Did not attend    Clinical Observations/Individualized Feedback: Patient did not attend group.   Plan: Continue to engage patient in RT group sessions 2-3x/week.   Rosina Lowenstein, LRT, CTRS 05/05/2023 5:24 PM

## 2023-05-06 DIAGNOSIS — F202 Catatonic schizophrenia: Secondary | ICD-10-CM | POA: Diagnosis not present
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
## 2023-05-06 NOTE — Progress Notes (Signed)
   05/06/23 1000  Psych Admission Type (Psych Patients Only)  Admission Status Involuntary  Psychosocial Assessment  Patient Complaints Anxiety;Depression (per patient, "a little bit", but can't state why she is feeling this way.)  Eye Contact Fair  Facial Expression Worried  Affect Appropriate to circumstance  Speech Soft  Interaction Minimal  Motor Activity Slow  Appearance/Hygiene Improved;In scrubs;Unremarkable  Behavior Characteristics Cooperative;Appropriate to situation  Mood Pleasant  Aggressive Behavior  Effect No apparent injury  Thought Process  Coherency WDL  Content WDL  Delusions None reported or observed  Perception WDL  Hallucination None reported or observed  Judgment WDL  Confusion None  Danger to Self  Current suicidal ideation? Denies  Agreement Not to Harm Self Yes  Description of Agreement Verbal  Danger to Others  Danger to Others None reported or observed

## 2023-05-06 NOTE — Group Note (Signed)
Date:  05/06/2023 Time:  9:24 PM  Group Topic/Focus:  Emotional Education:   The focus of this group is to discuss what feelings/emotions are, and how they are experienced.    Participation Level:  Did Not Attend   Lenore Cordia 05/06/2023, 9:24 PM

## 2023-05-06 NOTE — Group Note (Signed)
Date:  05/06/2023 Time:  4:36 PM  Group Topic/Focus:  Goals Group:   The focus of this group is to help patients establish daily goals to achieve during treatment and discuss how the patient can incorporate goal setting into their daily lives to aide in recovery.    Participation Level:  Did Not Attend   Rosaura Carpenter 05/06/2023, 4:36 PM

## 2023-05-06 NOTE — Group Note (Unsigned)
Date:  05/06/2023 Time:  11:10 AM  Group Topic/Focus:  Self Care:   The focus of this group is to help patients understand the importance of self-care in order to improve or restore emotional, physical, spiritual, interpersonal, and financial health.     Participation Level:  {BHH PARTICIPATION NWGNF:62130}  Participation Quality:  {BHH PARTICIPATION QUALITY:22265}  Affect:  {BHH AFFECT:22266}  Cognitive:  {BHH COGNITIVE:22267}  Insight: {BHH Insight2:20797}  Engagement in Group:  {BHH ENGAGEMENT IN GROUP:22268}  Modes of Intervention:  {BHH MODES OF INTERVENTION:22269}  Additional Comments:  ***  Isabella [name] 05/06/2023, 11:10 AM

## 2023-05-06 NOTE — BH IP Treatment Plan (Signed)
Interdisciplinary Treatment and Diagnostic Plan Update  05/06/2023 Time of Session: 05/06/2023 Isabella [name] MRN: [id number]  Principal Diagnosis: Catatonic schizophrenia [facility])  Secondary Diagnoses: Principal Problem:   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HCC)   Current Medications:  Current Facility-Administered Medications  Medication Dose Route Frequency Provider Last Rate Last Admin   acetaminophen (TYLENOL) tablet 650 mg  650 mg Oral Q6H PRN Starkes-Perry, Juel Burrow, FNP       alum & mag hydroxide-simeth (MAALOX/MYLANTA) 200-200-20 MG/5ML suspension 30 mL  30 mL Oral Q4H PRN Starkes-Perry, Juel Burrow, FNP       ARIPiprazole (ABILIFY) tablet 15 mg  15 mg Oral Daily Charm Rings, NP   15 mg at 05/06/23 4098   haloperidol (HALDOL) tablet 5 mg  5 mg Oral TID PRN Maryagnes Amos, FNP       And   diphenhydrAMINE (BENADRYL) capsule 50 mg  50 mg Oral TID PRN Maryagnes Amos, FNP       FLUoxetine (PROZAC) capsule 20 mg  20 mg Oral Daily Myriam Forehand, NP   20 mg at 05/06/23 1191   hydrOXYzine (ATARAX) tablet 25 mg  25 mg Oral Q6H PRN Charm Rings, NP       LORazepam (ATIVAN) tablet 1 mg  1 mg Oral BID Myriam Forehand, NP   1 mg at 05/06/23 4782   magnesium hydroxide (MILK OF MAGNESIA) suspension 30 mL  30 mL Oral Daily PRN Maryagnes Amos, FNP       melatonin tablet 5 mg  5 mg Oral QHS Myriam Forehand, NP   5 mg at 05/05/23 2055   nicotine polacrilex (NICORETTE) gum 2 mg  2 mg Oral PRN Myriam Forehand, NP       PTA Medications: Medications Prior to Admission  Medication Sig Dispense Refill Last Dose/Taking   FLUoxetine (PROZAC) 20 MG capsule Take 1 capsule (20 mg total) by mouth daily. 30 capsule 4    nicotine (NICODERM CQ - DOSED IN MG/24 HOURS) 21 mg/24hr patch Place 1 patch (21 mg total) onto the skin daily. 28 patch 0    OLANZapine (ZYPREXA) 10 MG tablet Take 1 tablet (10 mg total) by mouth at bedtime. 30 tablet 4    polyethylene glycol (MIRALAX / GLYCOLAX) 17 g  packet Take 17 g by mouth 2 (two) times daily. 14 each 0     Patient Stressors: Legal issue   Marital or family conflict   Medication change or noncompliance    Patient Strengths: Supportive family/friends   Treatment Modalities: Medication Management, Group therapy, Case management,  1 to 1 session with clinician, Psychoeducation, Recreational therapy.   Physician Treatment Plan for Primary Diagnosis: 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HCC) Long Term Goal(s): Improvement in symptoms so as ready for discharge   Short Term Goals: Ability to identify changes in lifestyle to reduce recurrence of condition will improve Ability to verbalize feelings will improve Ability to disclose and discuss suicidal ideas Ability to demonstrate self-control will improve Ability to identify and develop effective coping behaviors will improve Ability to maintain clinical measurements within normal limits will improve Compliance with prescribed medications will improve Ability to identify triggers associated with substance abuse/mental health issues will improve  Medication Management: Evaluate patient's response, side effects, and tolerance of medication regimen.  Therapeutic Interventions: 1 to 1 sessions, Unit Group sessions and Medication administration.  Evaluation of Outcomes: Progressing  Physician Treatment Plan for Secondary Diagnosis: Principal Problem:   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HCC)  Long Term Goal(s): Improvement  in symptoms so as ready for discharge   Short Term Goals: Ability to identify changes in lifestyle to reduce recurrence of condition will improve Ability to verbalize feelings will improve Ability to disclose and discuss suicidal ideas Ability to demonstrate self-control will improve Ability to identify and develop effective coping behaviors will improve Ability to maintain clinical measurements within normal limits will improve Compliance with prescribed medications will  improve Ability to identify triggers associated with substance abuse/mental health issues will improve     Medication Management: Evaluate patient's response, side effects, and tolerance of medication regimen.  Therapeutic Interventions: 1 to 1 sessions, Unit Group sessions and Medication administration.  Evaluation of Outcomes: Progressing   RN Treatment Plan for Primary Diagnosis: 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HCC) Long Term Goal(s): Knowledge of disease and therapeutic regimen to maintain health will improve  Short Term Goals: Ability to remain free from injury will improve, Ability to verbalize frustration and anger appropriately will improve, Ability to demonstrate self-control, Ability to participate in decision making will improve, Ability to verbalize feelings will improve, Ability to disclose and discuss suicidal ideas, Ability to identify and develop effective coping behaviors will improve, and Compliance with prescribed medications will improve  Medication Management: RN will administer medications as ordered by provider, will assess and evaluate patient's response and provide education to patient for prescribed medication. RN will report any adverse and/or side effects to prescribing provider.  Therapeutic Interventions: 1 on 1 counseling sessions, Psychoeducation, Medication administration, Evaluate responses to treatment, Monitor vital signs and CBGs as ordered, Perform/monitor CIWA, COWS, AIMS and Fall Risk screenings as ordered, Perform wound care treatments as ordered.  Evaluation of Outcomes: Progressing   LCSW Treatment Plan for Primary Diagnosis: 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HCC) Long Term Goal(s): Safe transition to appropriate next level of care at discharge, Engage patient in therapeutic group addressing interpersonal concerns.  Short Term Goals: Engage patient in aftercare planning with referrals and resources, Increase social support, Increase ability to appropriately  verbalize feelings, Increase emotional regulation, Facilitate acceptance of mental health diagnosis and concerns, Facilitate patient progression through stages of change regarding substance use diagnoses and concerns, Identify triggers associated with mental health/substance abuse issues, and Increase skills for wellness and recovery  Therapeutic Interventions: Assess for all discharge needs, 1 to 1 time with Social worker, Explore available resources and support systems, Assess for adequacy in community support network, Educate family and significant other(s) on suicide prevention, Complete Psychosocial Assessment, Interpersonal group therapy.  Evaluation of Outcomes: Progressing   Progress in Treatment: Attending groups: No. Participating in groups: No. Taking medication as prescribed: Yes. Toleration medication: Yes. Family/Significant other contact made: Yes, individual(s) contacted:  Rosamaria Lints, [phone] Patient understands diagnosis: No. Discussing patient identified problems/goals with staff: Yes. Medical problems stabilized or resolved: Yes. Denies suicidal/homicidal ideation: Yes. Issues/concerns per patient self-inventory: Yes. Other:   New problem(s) identified: No, Describe:  none identified  New Short Term/Long Term Goal(s): elimination of symptoms of psychosis, medication management for mood stabilization; elimination of SI thoughts; development of comprehensive mental wellness plan. Update: 05/06/2023 no changes at this time.   Patient Goals:  Pt was unable to meet with treatment team due to catatonia. Update: 05/06/2023 patient much less catatonic however still unable to answer most questions.   Discharge Plan or Barriers: CSW will assist pt with development of an appropriate aftercare/discharge plan. Update: 05/06/2023 no changes at this time.   Reason for Continuation of Hospitalization: Depression Medication stabilization Suicidal ideation. Update: 05/06/2023  no changes at this time.  Estimated Length of Stay: 1-7 days. Update: 05/06/2023 no changes at this time.   Last 3 [place] Suicide Severity Risk Score: Flowsheet Row Admission (Current) from 04/28/2023 in [facility]PAT[facility]ICINE ECT Treatment from 08/03/2020 [facility] Admission (Discharged) from 07/22/2020 in [facility] 1C MEDICAL TELEMETRY  C-SSRS RISK CATEGORY No Risk No Risk Error: Question 2 not populated       Last [facility] 2/9 Scores:     No data to display          Scribe for Treatment Team: Rhett Bannister 05/06/2023 10:12 AM

## 2023-05-06 NOTE — Plan of Care (Signed)
  Problem: Education: Goal: Knowledge of [facility] Education information/materials will improve Outcome: Progressing Goal: Emotional status will improve Outcome: Progressing Goal: Mental status will improve Outcome: Progressing Goal: Verbalization of understanding the information provided will improve Outcome: Progressing   Problem: Activity: Goal: Interest or engagement in activities will improve Outcome: Progressing Goal: Sleeping patterns will improve Outcome: Progressing   Problem: Coping: Goal: Ability to verbalize frustrations and anger appropriately will improve Outcome: Progressing Goal: Ability to demonstrate self-control will improve Outcome: Progressing   Problem: Health Behavior/Discharge Planning: Goal: Identification of resources available to assist in meeting health care needs will improve Outcome: Progressing Goal: Compliance with treatment plan for underlying cause of condition will improve Outcome: Progressing   Problem: Physical Regulation: Goal: Ability to maintain clinical measurements within normal limits will improve Outcome: Progressing   Problem: Safety: Goal: Periods of time without injury will increase Outcome: Progressing   Problem: Education: Goal: Utilization of techniques to improve thought processes will improve Outcome: Progressing Goal: Knowledge of the prescribed therapeutic regimen will improve Outcome: Progressing   Problem: Coping: Goal: Coping ability will improve Outcome: Progressing Goal: Will verbalize feelings Outcome: Progressing

## 2023-05-07 DIAGNOSIS — F202 Catatonic schizophrenia: Secondary | ICD-10-CM | POA: Diagnosis not present

## 2023-05-07 MED ORDER — LORAZEPAM 1 MG PO TABS: 1.0000 mg | ORAL_TABLET | Freq: Two times a day (BID) | ORAL | 0 refills | Status: DC

## 2023-05-07 MED ORDER — MELATONIN 5 MG PO TABS: 5.0000 mg | ORAL_TABLET | Freq: Every day | ORAL | 0 refills | Status: DC

## 2023-05-07 MED ORDER — ARIPIPRAZOLE 15 MG PO TABS: 15.0000 mg | ORAL_TABLET | Freq: Every day | ORAL | 0 refills | Status: DC

## 2023-05-07 MED ORDER — HYDROXYZINE HCL 25 MG PO TABS: 25.0000 mg | ORAL_TABLET | Freq: Four times a day (QID) | ORAL | 0 refills | Status: DC | PRN

## 2023-05-07 MED ORDER — FLUOXETINE HCL 20 MG PO CAPS: 20.0000 mg | ORAL_CAPSULE | Freq: Every day | ORAL | 0 refills | Status: DC

## 2023-05-07 NOTE — Progress Notes (Signed)
  [facility] Adult Case Management Discharge Plan :  Will you be returning to the same living situation after discharge:  Yes,  [address] [place]  [id number] At discharge, do you have transportation home?: No. Do you have the ability to pay for your medications: Yes,  Medicaid   Release of information consent forms completed and in the chart;  Patient's signature needed at discharge.  Patient to Follow up at:  Follow-up Information     Inc, [employer]. Go to.   Why: You must do a walk in appointment to set up as a new client.  Clinic is open 8 AM- 5 PM Mon-Fri.   Behavorial health unit is accessible 24 Hours a day if needed. Contact information: [address] [place] [address] [phone]                 Next level of care provider has access to [facility] Link:yes  Safety Planning and Suicide Prevention discussed: Yes,   Rosamaria Lints [phone]     Has patient been referred to the Quitline?: Patient does not use tobacco/nicotine products  Patient has been referred for addiction treatment: Yes, referral information given but appointment not made [facility] (list facility).  Marshell Levan, LCSW 05/07/2023, 9:55 AM

## 2023-05-07 NOTE — [facility] Suicide Risk Assessment (Signed)
[facility] Discharge Suicide Risk Assessment   Principal Problem: Catatonic schizophrenia [facility]) Discharge Diagnoses: Principal Problem:   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HCC)   Total Time spent with patient: 45 minutes  Musculoskeletal: Strength & Muscle Tone: within normal limits Gait & Station: normal Patient leans: N/A  Psychiatric Specialty Exam: Physical Exam Vitals and nursing note reviewed.  Constitutional:      Appearance: Normal appearance.  HENT:     Head: Normocephalic.     Nose: Nose normal.  Pulmonary:     Effort: Pulmonary effort is normal.  Musculoskeletal:        General: Normal range of motion.     Cervical back: Normal range of motion.  Neurological:     General: No focal deficit present.     Mental Status: She is alert and oriented to person, place, and time.     Review of Systems  Psychiatric/Behavioral:  Positive for depression. The patient is nervous/anxious.   All other systems reviewed and are negative.   Blood pressure 101/78, pulse 61, temperature 98.1 F (36.7 C), resp. rate (!) 22, height 5\' 4"  (1.626 m), weight 77.6 kg, SpO2 96%.Body mass index is 29.35 kg/m.  General Appearance: Casual  Eye Contact:  Good  Speech:  Normal Rate  Volume:  Normal  Mood:  Anxious and Depressed, mild levels, "a little bit"  Affect:  Flat  Thought Process:  Coherent  Orientation:  Full (Time, Place, and Person)  Thought Content:  Logical  Suicidal Thoughts:  No  Homicidal Thoughts:  No  Memory:  Immediate;   Good Recent;   Good Remote;   Good  Judgement:  Good  Insight:  Good  Psychomotor Activity:  Normal  Concentration:  Concentration: Good and Attention Span: Good  Recall:  Good  Fund of Knowledge:  Good  Language:  Good  Akathisia:  No  Handed:  Right  AIMS (if indicated):     Assets:  Housing Leisure Time Physical Health Resilience Social Support  ADL's:  Intact  Cognition:  WNL  Sleep:        Physical Exam: Physical Exam Vitals and nursing  note reviewed.  Constitutional:      Appearance: Normal appearance.  HENT:     Head: Normocephalic.     Nose: Nose normal.  Pulmonary:     Effort: Pulmonary effort is normal.  Musculoskeletal:        General: Normal range of motion.     Cervical back: Normal range of motion.  Neurological:     General: No focal deficit present.     Mental Status: She is alert and oriented to person, place, and time.    Review of Systems  Psychiatric/Behavioral:  Positive for depression. The patient is nervous/anxious.   All other systems reviewed and are negative.  Blood pressure 101/78, pulse 61, temperature 98.1 F (36.7 C), resp. rate (!) 22, height 5\' 4"  (1.626 m), weight 77.6 kg, SpO2 96%. Body mass index is 29.35 kg/m.  Mental Status Per Nursing Assessment::   On Admission:  NA  Demographic Factors:  Unemployed  Loss Factors: NA  Historical Factors: NA  Risk Reduction Factors:   Sense of responsibility to family, Living with another person, especially a relative, Positive social support, and Positive therapeutic relationship  Continued Clinical Symptoms:  Depression and anxiety, low levels  Cognitive Features That Contribute To Risk:  None    Suicide Risk:  Minimal: No identifiable suicidal ideation.  Patients presenting with no risk factors but with morbid ruminations;  may be classified as minimal risk based on the severity of the depressive symptoms   Follow-up Information     Inc, [facility]. Go to.   Why: You must do a walk in appointment to set up as a new client.  Clinic is open 8 AM- 5 PM Mon-Fri.   Behavorial health unit is accessible 24 Hours a day if needed. Contact information: [address] [place] [address] [phone]                 Plan Of Care/Follow-up recommendations:  Activity:  as tolerated Diet:  heart healthy diet Schizophrenia, catatonia type: Abilify 15 mg daily Ativan 1 mg BID   Depression: Prozac 40 mg  daily   Anxiety: Hydroxyzine 25 mg TID PRN  Nanine Means, NP 05/07/2023, 9:45 AM

## 2023-05-07 NOTE — Progress Notes (Signed)
   05/07/23 0900  Psych Admission Type (Psych Patients Only)  Admission Status Involuntary  Psychosocial Assessment  Patient Complaints Anxiety;Depression (per patient, "a little bit", but could not voice why she is feeling this way. However, patient does state that she feels the medication is working)  Biomedical scientist  Facial Expression Sad  Affect Appropriate to circumstance  Speech Slow;Slurred  Interaction Forwards little;Isolative;Defensive  Motor Activity Slow  Appearance/Hygiene In scrubs;Unremarkable  Behavior Characteristics Appropriate to situation;Cooperative  Mood Pleasant  Aggressive Behavior  Effect No apparent injury  Thought Process  Coherency WDL  Content WDL  Delusions WDL  Perception WDL  Hallucination None reported or observed  Judgment WDL  Confusion None  Danger to Self  Current suicidal ideation? Denies  Agreement Not to Harm Self No  Description of Agreement Verbal  Danger to Others  Danger to Others None reported or observed
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
## 2023-05-07 NOTE — Plan of Care (Signed)
  Problem: Education: Goal: Knowledge of [place] [facility] Education information/materials will improve Outcome: Adequate for Discharge Goal: Emotional status will improve Outcome: Adequate for Discharge Goal: Mental status will improve Outcome: Adequate for Discharge Goal: Verbalization of understanding the information provided will improve Outcome: Adequate for Discharge   Problem: Activity: Goal: Interest or engagement in activities will improve Outcome: Adequate for Discharge Goal: Sleeping patterns will improve Outcome: Adequate for Discharge   Problem: Coping: Goal: Ability to verbalize frustrations and anger appropriately will improve Outcome: Adequate for Discharge Goal: Ability to demonstrate self-control will improve Outcome: Adequate for Discharge   Problem: Health Behavior/Discharge Planning: Goal: Identification of resources available to assist in meeting health care needs will improve Outcome: Adequate for Discharge Goal: Compliance with treatment plan for underlying cause of condition will improve Outcome: Adequate for Discharge   Problem: Physical Regulation: Goal: Ability to maintain clinical measurements within normal limits will improve Outcome: Adequate for Discharge   Problem: Safety: Goal: Periods of time without injury will increase Outcome: Adequate for Discharge   Problem: Education: Goal: Utilization of techniques to improve thought processes will improve Outcome: Adequate for Discharge Goal: Knowledge of the prescribed therapeutic regimen will improve Outcome: Adequate for Discharge   Problem: Coping: Goal: Coping ability will improve Outcome: Adequate for Discharge Goal: Will verbalize feelings Outcome: Adequate for Discharge

## 2023-05-07 NOTE — Progress Notes (Signed)
Patient ID: Isabella [name], female   DOB: [birth year], 47 y.o.   MRN: [id number]  Discharge Note:  Patient denies SI/HI/AVH at this time. Discharge instructions, AVS, prescriptions, and transition record gone over with patient. Patient given a copy of her Suicide Safety Plan. Patient agrees to comply with medication management, follow-up visit, and outpatient therapy. Patient belongings returned to patient. Patient questions and concerns addressed and answered. Patient ambulatory off unit. Patient discharged to home via Taxicab services.

## 2023-05-20 ENCOUNTER — Other Ambulatory Visit: Payer: Self-pay

## 2023-05-20 ENCOUNTER — Emergency Department
Admission: EM | Admit: 2023-05-20 | Discharge: 2023-05-22 | Disposition: A | Discharge status: Home / Self Care | Payer: Medicaid Other | Attending: Emergency Medicine | Admitting: Emergency Medicine

## 2023-05-20 DIAGNOSIS — R630 Anorexia: Secondary | ICD-10-CM | POA: Diagnosis not present

## 2023-05-20 DIAGNOSIS — F314 Bipolar disorder, current episode depressed, severe, without psychotic features: Secondary | ICD-10-CM | POA: Diagnosis not present

## 2023-05-20 DIAGNOSIS — G4700 Insomnia, unspecified: Secondary | ICD-10-CM | POA: Diagnosis not present

## 2023-05-20 DIAGNOSIS — R4589 Other symptoms and signs involving emotional state: Secondary | ICD-10-CM

## 2023-05-20 DIAGNOSIS — R109 Unspecified abdominal pain: Secondary | ICD-10-CM | POA: Diagnosis not present

## 2023-05-20 DIAGNOSIS — F332 Major depressive disorder, recurrent severe without psychotic features: Secondary | ICD-10-CM | POA: Insufficient documentation

## 2023-05-20 DIAGNOSIS — I10 Essential (primary) hypertension: Secondary | ICD-10-CM | POA: Diagnosis not present

## 2023-05-20 DIAGNOSIS — F202 Catatonic schizophrenia: Secondary | ICD-10-CM | POA: Diagnosis not present

## 2023-05-20 DIAGNOSIS — R110 Nausea: Secondary | ICD-10-CM | POA: Insufficient documentation

## 2023-05-20 DIAGNOSIS — F331 Major depressive disorder, recurrent, moderate: Secondary | ICD-10-CM | POA: Insufficient documentation

## 2023-05-20 LAB — CBC
HCT: 43.4 % (ref 36.0–46.0)
Hemoglobin: 14.7 g/dL (ref 12.0–15.0)
MCH: 32.4 pg (ref 26.0–34.0)
MCHC: 33.9 g/dL (ref 30.0–36.0)
MCV: 95.6 fL (ref 80.0–100.0)
Platelets: 361 10*3/uL (ref 150–400)
RBC: 4.54 MIL/uL (ref 3.87–5.11)
RDW: 12.4 % (ref 11.5–15.5)
WBC: 9.8 10*3/uL (ref 4.0–10.5)
nRBC: 0 % (ref 0.0–0.2)

## 2023-05-20 LAB — LIPASE, BLOOD: Lipase: 36 U/L (ref 11–51)

## 2023-05-20 LAB — COMPREHENSIVE METABOLIC PANEL
ALT: 13 U/L (ref 0–44)
AST: 16 U/L (ref 15–41)
Albumin: 3.9 g/dL (ref 3.5–5.0)
Alkaline Phosphatase: 59 U/L (ref 38–126)
Anion gap: 9 (ref 5–15)
BUN: 21 mg/dL — ABNORMAL HIGH (ref 6–20)
CO2: 24 mmol/L (ref 22–32)
Calcium: 9.1 mg/dL (ref 8.9–10.3)
Chloride: 107 mmol/L (ref 98–111)
Creatinine, Ser: 0.72 mg/dL (ref 0.44–1.00)
GFR, Estimated: >60 mL/min (ref >60)
Glucose, Bld: 129 mg/dL — ABNORMAL HIGH (ref 70–99)
Potassium: 3.7 mmol/L (ref 3.5–5.1)
Sodium: 140 mmol/L (ref 135–145)
Total Bilirubin: 0.8 mg/dL (ref 0.0–1.2)
Total Protein: 7.2 g/dL (ref 6.5–8.1)

## 2023-05-20 LAB — ACETAMINOPHEN LEVEL: Acetaminophen (Tylenol), Serum: <10 ug/mL — ABNORMAL LOW (ref 10–30)

## 2023-05-20 LAB — SALICYLATE LEVEL: Salicylate Lvl: <7 mg/dL — ABNORMAL LOW (ref 7.0–30.0)

## 2023-05-20 LAB — ETHANOL: Alcohol, Ethyl (B): <10 mg/dL (ref <10)

## 2023-05-20 MED ORDER — ONDANSETRON 4 MG PO TBDP: 4.0000 mg | ORAL_TABLET | Freq: Once | ORAL | Status: DC

## 2023-05-20 NOTE — ED Notes (Signed)
 Patient to 19H from RM 85, this RN assumed care of patient.  Patient awaiting psych assessment.

## 2023-05-20 NOTE — ED Notes (Signed)
 TTS here to speak with patient and family.

## 2023-05-20 NOTE — ED Provider Notes (Signed)
 [facility] Provider Note    Event Date/Time   First MD Initiated Contact with Patient 05/20/23 8177     (approximate)   History   Nausea and Abdominal Pain   HPI  Isabella [name] is a 48 year old female with history of schizoaffective disorder with recurrent catatonia, hypertension presenting to the emergency department for evaluation of decreased appetite.  Accompanied by family who reports that patient has had nausea with decreased appetite for the last several weeks.  Was recently seen for catatonic schizophrenia with erratic behaviors.  Patient was placed back on her medication with improvement and discharged on 05/07/2023.  Family reports that typically patient will have an episode of behavioral outburst, followed by a period of depressed mood.  Reports that this most recently occurred in 2022.  Patient denies SI, HI, AVH.  I did additionally review her discharge from 08/05/2020.  At that time, she presented with catatonia requiring ECT treatment, sepsis due to UTI.       Physical Exam   Triage Vital Signs: ED Triage Vitals  Encounter Vitals Group     BP 05/20/23 1722 (!) 133/90     Systolic BP Percentile --      Diastolic BP Percentile --      Pulse Rate 05/20/23 1720 90     Resp 05/20/23 1720 20     Temp 05/20/23 1720 98.9 F (37.2 C)     Temp Source 05/20/23 1720 Oral     SpO2 05/20/23 1720 95 %     Weight --      Height --      Head Circumference --      Peak Flow --      Pain Score 05/20/23 1721 7     Pain Loc --      Pain Education --      Exclude from Growth Chart --     Most recent vital signs: Vitals:   05/20/23 1720 05/20/23 1722  BP:  (!) 133/90  Pulse: 90   Resp: 20   Temp: 98.9 F (37.2 C)   SpO2: 95%      General: Awake, interactive  CV:  Regular rate, good peripheral perfusion.  Resp:  Unlabored respirations. Abd:  Nondistended, soft, no significant tenderness palpation Neuro:  Symmetric facial  movement, slow but fluid speech Psych:  Flat affect   ED Results / Procedures / Treatments   Labs (all labs ordered are listed, but only abnormal results are displayed) Labs Reviewed  COMPREHENSIVE METABOLIC PANEL - Abnormal; Notable for the following components:      Result Value   Glucose, Bld 129 (*)    BUN 21 (*)    All other components within normal limits  LIPASE, BLOOD  CBC  URINALYSIS, ROUTINE W REFLEX MICROSCOPIC  URINE DRUG SCREEN, QUALITATIVE ([facility] ONLY)  ETHANOL  ACETAMINOPHEN  LEVEL  SALICYLATE LEVEL  POC URINE PREG, ED     EKG EKG independently reviewed interpreted by myself (ER attending) demonstrates:    RADIOLOGY Imaging independently reviewed and interpreted by myself demonstrates:    PROCEDURES:  Critical Care performed: No  Procedures   MEDICATIONS ORDERED IN ED: Medications - No data to display   IMPRESSION / MDM / ASSESSMENT AND PLAN / ED COURSE  I reviewed the triage vital signs and the nursing notes.  Differential diagnosis includes, but is not limited to, catatonic schizophrenia with depressed mood, medication adverse effect, electrolyte abnormality, lower suspicion for significant cute intra-abdominal process given duration  of symptoms and reassuring abdominal exam  Patient's presentation is most consistent with acute presentation with potential threat to life or bodily function.  48 year old female presenting with depressed mood with known history of recurrent catatonia, previously requiring ECT.  Labs here overall reassuring as well as abdominal exam.  Do think patient can be medically cleared.  However, while I do not feel there is an indication for IVC, patient is agreeable for behavioral health evaluation.  With her history of catatonia with depressed mood, do think this is reasonable.  Will consult psychiatry and TTS.    FINAL CLINICAL IMPRESSION(S) / ED DIAGNOSES   Final diagnoses:  Decreased appetite  Depressed mood     Rx  / DC Orders   ED Discharge Orders     None        Note:  This document was prepared using Dragon voice recognition software and may include unintentional dictation errors.   Levander Slate, MD 05/20/23 [phone]

## 2023-05-20 NOTE — ED Notes (Addendum)
 This tech assisted Latyjua, EDT, with the dress out.  Pt belongings: Pink jacket Orange tshirt  Red bra Blue jeans Black sandals Pink underwear   Pt's family mentioned her ID was possibly on her, but they checked her jacket and jeans and they confirmed that there was nothing there.
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
## 2023-05-20 NOTE — BH Assessment (Signed)
 Comprehensive Clinical Assessment (CCA) Note  05/20/2023 Isabella [name] [id number]  Chief Complaint: Patient is a 48 year old female presenting to [facility] ED voluntarily with her sister. Per triage note Pt to ED via POV from home. Pt reports nausea, decreased appetite and abd pain x15days. NP in triage to assess pt. Sister states pt has not been eating due to increased depression. Pt denies SI/HI, drug use or etoh use. During assessment patient appears alert and oriented x3, calm and cooperative, mood appears depressed and affect is flat. Patient's sister [name] reports she came here for a belly ache, she's been having depression, she just wants to lay down. Patient reports that she has been feeling depressed for 15 days, she was also recently hospitalized in December for Catatonic Schizophrenia and at that time the patient was engaging in risky and dangerous behaviors, since the patient has been discharged she has been depressed with a decrease in appetite and weight loss. Patient is also not connected with a psychiatrist, she used to go to [facility] but has not been since June or July. Patient currently denies SI/HI/AH/VH.  Per Psyc NP Madelene Fireman patient is recommended for Inpatient Chief Complaint  Patient presents with   Nausea   Abdominal Pain   Visit Diagnosis: Bipolar, Catatonic Schizophrenia     CCA Screening, Triage and Referral (STR)  Patient Reported Information How did you hear about us ? Family/Friend  Referral name: No data recorded Referral phone number: No data recorded  Whom do you see for routine medical problems? No data recorded Practice/Facility Name: No data recorded Practice/Facility Phone Number: No data recorded Name of Contact: No data recorded Contact Number: No data recorded Contact Fax Number: No data recorded Prescriber Name: No data recorded Prescriber Address (if known): No data recorded  What Is the Reason for Your Visit/Call Today? Pt to ED  via POV from home. Pt reports nausea, decreased appetite and abd pain x15days. NP in triage to assess pt. Sister states pt has not been eating due to increased depression. Pt denies SI/HI, drug use or etoh use.  How Long Has This Been Causing You Problems? > than 6 months  What Do You Feel Would Help You the Most Today? Treatment for Depression or other mood problem   Have You Recently Been in Any Inpatient Treatment (Hospital/Detox/Crisis Center/28-Day Program)? No data recorded Name/Location of Program/Hospital:No data recorded How Long Were You There? No data recorded When Were You Discharged? No data recorded  Have You Ever Received Services From [facility] Before? No data recorded Who Do You See at [facility]? No data recorded  Have You Recently Had Any Thoughts About Hurting Yourself? No  Are You Planning to Commit Suicide/Harm Yourself At This time? No   Have you Recently Had Thoughts About Hurting Someone [place]? No  Explanation: n/a   Have You Used Any Alcohol or Drugs in the Past 24 Hours? No data recorded How Long Ago Did You Use Drugs or Alcohol? No data recorded What Did You Use and How Much? No data recorded  Do You Currently Have a Therapist/Psychiatrist? No data recorded Name of Therapist/Psychiatrist: None   Have You Been Recently Discharged From Any Office Practice or Programs? No  Explanation of Discharge From Practice/Program: Pt was released from jail 2 days ago     CCA Screening Triage Referral Assessment Type of Contact: Face-to-Face  Is this Initial or Reassessment? No data recorded Date Telepsych consult ordered in [facility]:  No data recorded Time Telepsych  consult ordered in [facility]:  No data recorded  Patient Reported Information Reviewed? No data recorded Patient Left Without Being Seen? No data recorded Reason for Not Completing Assessment: No data recorded  Collateral Involvement: n/a   Does Patient Have a Court Appointed Legal Guardian? No  data recorded Name and Contact of Legal Guardian: No data recorded If Minor and Not Living with Parent(s), Who has Custody? n/a  Is CPS involved or ever been involved? Never  Is APS involved or ever been involved? Never   Patient Determined To Be At Risk for Harm To Self or Others Based on Review of Patient Reported Information or Presenting Complaint? No  Method: No Plan  Availability of Means: No access or NA  Intent: Vague intent or NA  Notification Required: No need or identified person  Additional Information for Danger to Others Potential: Active psychosis  Additional Comments for Danger to Others Potential: n/a  Are There Guns or Other Weapons in Your Home? No  Types of Guns/Weapons: n/a  Are These Weapons Safely Secured?                            No  Who Could Verify You Are Able To Have These Secured: n/a  Do You Have any Outstanding Charges, Pending Court Dates, Parole/Probation? Pt reported having trespassing and robbery charges  Contacted To Inform of Risk of Harm To Self or Others: Other: Comment   Location of Assessment: [facility] ED   Does Patient Present under Involuntary Commitment? No  IVC Papers Initial File Date: No data recorded  [place] of Residence: [place]   Patient Currently Receiving the Following Services: Medication Management   Determination of Need: Emergent (2 hours)   Options For Referral: Inpatient Hospitalization     CCA Biopsychosocial Intake/Chief Complaint:  No data recorded Current Symptoms/Problems: No data recorded  Patient Reported Schizophrenia/Schizoaffective Diagnosis in Past: Yes   Strengths: Pt is receptive to recieving treatment  Preferences: No data recorded Abilities: No data recorded  Type of Services Patient Feels are Needed: No data recorded  Initial Clinical Notes/Concerns: No data recorded  Mental Health Symptoms Depression:  Change in energy/activity; Fatigue; Hopelessness; Weight gain/loss;  Sleep (too much or little)   Duration of Depressive symptoms: Greater than two weeks   Mania:  None   Anxiety:   None   Psychosis:  None   Duration of Psychotic symptoms: No data recorded  Trauma:  None   Obsessions:  None   Compulsions:  None   Inattention:  None   Hyperactivity/Impulsivity:  None   Oppositional/Defiant Behaviors:  Temper   Emotional Irregularity:  Potentially harmful impulsivity   Other Mood/Personality Symptoms:  n/a    Mental Status Exam Appearance and self-care  Stature:  Average   Weight:  Average weight   Clothing:  Casual   Grooming:  Normal   Cosmetic use:  None   Posture/gait:  Normal   Motor activity:  Not Remarkable   Sensorium  Attention:  Normal   Concentration:  Normal   Orientation:  X5   Recall/memory:  Normal   Affect and Mood  Affect:  Flat   Mood:  Depressed   Relating  Eye contact:  Normal   Facial expression:  Responsive; Sad   Attitude toward examiner:  Cooperative   Thought and Language  Speech flow: Soft   Thought content:  Appropriate to Mood and Circumstances   Preoccupation:  None   Hallucinations:  None   Organization:  No data recorded  [employer] of Knowledge:  Average   Intelligence:  Average   Abstraction:  Functional   Judgement:  Poor   Reality Testing:  Variable   Insight:  Fair   Decision Making:  Normal   Social Functioning  Social Maturity:  Isolates   Social Judgement:  Normal   Stress  Stressors:  Illness   Coping Ability:  Exhausted   Skill Deficits:  Decision making; Self-control   Supports:  Family; Support needed     Religion: Religion/Spirituality Are You A Religious Person?: No  Leisure/Recreation: Leisure / Recreation Do You Have Hobbies?:  (UTA)  Exercise/Diet: Exercise/Diet Do You Exercise?:  (UTA) Have You Gained or Lost A Significant Amount of Weight in the Past Six Months?:  (UTA) Do You Follow a Special Diet?:   (UTA) Do You Have Any Trouble Sleeping?:  (UTA)   CCA Employment/Education Employment/Work Situation: Employment / Work Situation Employment Situation: On disability Why is Patient on Disability: Unable to assess. How Long has Patient Been on Disability: Unable to assess. Has Patient ever Been in the Military?: No  Education: Education Is Patient Currently Attending School?: No Did You Have An Individualized Education Program (IIEP): No Did You Have Any Difficulty At School?: No Patient's Education Has Been Impacted by Current Illness: No   CCA Family/Childhood History Family and Relationship History: Family history Marital status: Single Does patient have children?: Yes How many children?: 2 How is patient's relationship with their children?: Per previous assessor, 38 and 50 year old  Childhood History:  Childhood History By whom was/is the patient raised?: Both parents Did patient suffer any verbal/emotional/physical/sexual abuse as a child?: No Did patient suffer from severe childhood neglect?: No Has patient ever been sexually abused/assaulted/raped as an adolescent or adult?: No Was the patient ever a victim of a crime or a disaster?: No Witnessed domestic violence?: No Has patient been affected by domestic violence as an adult?: No  Child/Adolescent Assessment:     CCA Substance Use Alcohol/Drug Use: Alcohol / Drug Use Pain Medications: See MAR Prescriptions: See MAR Over the Counter: See MAR History of alcohol / drug use?: No history of alcohol / drug abuse                         ASAM's:  Six Dimensions of Multidimensional Assessment  Dimension 1:  Acute Intoxication and/or Withdrawal Potential:      Dimension 2:  Biomedical Conditions and Complications:      Dimension 3:  Emotional, Behavioral, or Cognitive Conditions and Complications:     Dimension 4:  Readiness to Change:     Dimension 5:  Relapse, Continued use, or Continued Problem  Potential:     Dimension 6:  Recovery/Living Environment:     ASAM Severity Score:    ASAM Recommended Level of Treatment:     Substance use Disorder (SUD)    Recommendations for Services/Supports/Treatments:    DSM5 Diagnoses: Patient Active Problem List   Diagnosis Date Noted   MDD (major depressive disorder), recurrent episode, moderate (HCC) 05/20/2023   Severe bipolar I disorder, current or most recent episode depressed (HCC) 05/20/2023   Sepsis (HCC) 07/23/2020   HTN (hypertension) 07/22/2020   Tobacco abuse 07/22/2020   LFTs abnormal 07/21/2020   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HCC) 07/20/2020   Social anxiety disorder 08/02/2016    Patient Centered Plan: Patient is on the following Treatment Plan(s):  Depression  Referrals to Alternative Service(s): Referred to Alternative Service(s):   Place:   Date:   Time:    Referred to Alternative Service(s):   Place:   Date:   Time:    Referred to Alternative Service(s):   Place:   Date:   Time:    Referred to Alternative Service(s):   Place:   Date:   Time:      @BHCOLLABOFCARE @  [employer], LCAS-A

## 2023-05-20 NOTE — ED Provider Triage Note (Signed)
 Emergency Medicine Provider Triage Evaluation Note  Isabella [name] , a 48 y.o. female  was evaluated in triage.  Pt complains of epigastric pain, nauseas.  Patient states taking antidepressants.  As of her depression she does not want to eat  Review of Systems  Positive:  Negative:   Physical Exam  BP (!) 133/90   Pulse 90   Temp 98.9 F (37.2 C) (Oral)   Resp 20   SpO2 95%  Gen:   Awake, no distress   Resp:  Normal effort  MSK:   Moves extremities without difficulty  Other:    Medical Decision Making  Medically screening exam initiated at 5:26 PM.  Appropriate orders placed.  Demetrius Barrell was informed that the remainder of the evaluation will be completed by another provider, this initial triage assessment does not replace that evaluation, and the importance of remaining in the ED until their evaluation is complete.  Patient with depression, asthenia, poor appetite epigastric pain and nauseas   Janit Kast, PA-C 05/20/23 1728

## 2023-05-20 NOTE — ED Triage Notes (Addendum)
 Pt to ED via POV from home. Pt reports nausea, decreased appetite and abd pain x15days. NP in triage to assess pt. Sister states pt has not been eating due to increased depression. Pt denies SI/HI, drug use or etoh use.

## 2023-05-21 DIAGNOSIS — R630 Anorexia: Secondary | ICD-10-CM

## 2023-05-21 DIAGNOSIS — R4589 Other symptoms and signs involving emotional state: Secondary | ICD-10-CM | POA: Diagnosis not present

## 2023-05-21 DIAGNOSIS — F314 Bipolar disorder, current episode depressed, severe, without psychotic features: Secondary | ICD-10-CM

## 2023-05-21 LAB — URINE DRUG SCREEN, QUALITATIVE (ARMC ONLY)
Amphetamines, Ur Screen: NOT DETECTED
Barbiturates, Ur Screen: NOT DETECTED
Benzodiazepine, Ur Scrn: POSITIVE — AB
Cannabinoid 50 Ng, Ur ~~LOC~~: NOT DETECTED
Cocaine Metabolite,Ur ~~LOC~~: NOT DETECTED
MDMA (Ecstasy)Ur Screen: NOT DETECTED
Methadone Scn, Ur: NOT DETECTED
Opiate, Ur Screen: NOT DETECTED
Phencyclidine (PCP) Ur S: NOT DETECTED
Tricyclic, Ur Screen: NOT DETECTED

## 2023-05-21 LAB — URINALYSIS, ROUTINE W REFLEX MICROSCOPIC
Bilirubin Urine: NEGATIVE
Glucose, UA: NEGATIVE mg/dL
Hgb urine dipstick: NEGATIVE
Ketones, ur: 5 mg/dL — AB
Leukocytes,Ua: NEGATIVE
Nitrite: NEGATIVE
Protein, ur: 30 mg/dL — AB
Specific Gravity, Urine: 1.031 — ABNORMAL HIGH (ref 1.005–1.030)
pH: 5 (ref 5.0–8.0)

## 2023-05-21 LAB — POC URINE PREG, ED: Preg Test, Ur: NEGATIVE

## 2023-05-21 MED ORDER — FOSFOMYCIN TROMETHAMINE 3 G PO PACK
3.0000 g | PACK | Freq: Once | ORAL | Status: AC
  Administered 2023-05-21: 3 g via ORAL
  Filled 2023-05-21: qty 3

## 2023-05-21 NOTE — ED Notes (Signed)
Pt denies SI/HI/AVH on assessment 

## 2023-05-21 NOTE — ED Notes (Signed)
Patient is vol pending placement 

## 2023-05-21 NOTE — ED Notes (Signed)
 VOL/ when med cleared, recom psych inpt

## 2023-05-21 NOTE — ED Notes (Signed)
 Hospital meal provided, pt tolerated w/o complaints.  Waste discarded appropriately.

## 2023-05-21 NOTE — ED Provider Notes (Signed)
 No complaints of dysuria, no sepsis or leukocytosis.  Urine is equivocal for UTI.  We will send this for culture and provide a dose of fosfomycin.  She is medically cleared for psychiatric disposition.   Delton Prairie, MD 05/21/23 [phone]

## 2023-05-22 ENCOUNTER — Inpatient Hospital Stay
Admission: RE | Admit: 2023-05-22 | Discharge: 2023-05-29 | DRG: 885 | Disposition: A | Discharge status: Home / Self Care | Payer: Medicaid Other | Source: Ambulatory Visit | Attending: Psychiatry | Admitting: Psychiatry

## 2023-05-22 ENCOUNTER — Other Ambulatory Visit: Payer: Self-pay

## 2023-05-22 ENCOUNTER — Encounter: Payer: Self-pay | Admitting: Psychiatric/Mental Health

## 2023-05-22 DIAGNOSIS — F319 Bipolar disorder, unspecified: Principal | ICD-10-CM | POA: Diagnosis present

## 2023-05-22 DIAGNOSIS — F419 Anxiety disorder, unspecified: Secondary | ICD-10-CM | POA: Diagnosis present

## 2023-05-22 DIAGNOSIS — R630 Anorexia: Secondary | ICD-10-CM | POA: Diagnosis not present

## 2023-05-22 DIAGNOSIS — F314 Bipolar disorder, current episode depressed, severe, without psychotic features: Secondary | ICD-10-CM | POA: Diagnosis not present

## 2023-05-22 DIAGNOSIS — Z841 Family history of disorders of kidney and ureter: Secondary | ICD-10-CM

## 2023-05-22 DIAGNOSIS — I10 Essential (primary) hypertension: Secondary | ICD-10-CM | POA: Diagnosis present

## 2023-05-22 DIAGNOSIS — Z833 Family history of diabetes mellitus: Secondary | ICD-10-CM | POA: Diagnosis not present

## 2023-05-22 DIAGNOSIS — F17210 Nicotine dependence, cigarettes, uncomplicated: Secondary | ICD-10-CM | POA: Diagnosis present

## 2023-05-22 DIAGNOSIS — R4589 Other symptoms and signs involving emotional state: Secondary | ICD-10-CM | POA: Diagnosis present

## 2023-05-22 DIAGNOSIS — Z79899 Other long term (current) drug therapy: Secondary | ICD-10-CM

## 2023-05-22 DIAGNOSIS — F202 Catatonic schizophrenia: Secondary | ICD-10-CM | POA: Diagnosis present

## 2023-05-22 DIAGNOSIS — Z91148 Patient's other noncompliance with medication regimen for other reason: Secondary | ICD-10-CM | POA: Diagnosis not present

## 2023-05-22 LAB — URINE CULTURE

## 2023-05-22 MED ORDER — LAMOTRIGINE 25 MG PO TABS
50.0000 mg | ORAL_TABLET | Freq: Every day | ORAL | Status: DC
  Administered 2023-05-22 – 2023-05-24 (×3): 50 mg via ORAL
  Filled 2023-05-22 (×3): qty 2

## 2023-05-22 MED ORDER — ALUM & MAG HYDROXIDE-SIMETH 200-200-20 MG/5ML PO SUSP: 30.0000 mL | ORAL | Status: DC | PRN

## 2023-05-22 MED ORDER — CLONAZEPAM 0.25 MG PO TBDP
0.2500 mg | ORAL_TABLET | Freq: Two times a day (BID) | ORAL | Status: AC
  Administered 2023-05-24 – 2023-05-25 (×2): 0.25 mg via ORAL
  Filled 2023-05-22 (×2): qty 1

## 2023-05-22 MED ORDER — CLONAZEPAM 0.25 MG PO TBDP
0.5000 mg | ORAL_TABLET | Freq: Two times a day (BID) | ORAL | Status: AC
  Administered 2023-05-23 – 2023-05-24 (×2): 0.5 mg via ORAL
  Filled 2023-05-22: qty 4
  Filled 2023-05-22: qty 2

## 2023-05-22 MED ORDER — NICOTINE 21 MG/24HR TD PT24
21.0000 mg | MEDICATED_PATCH | Freq: Every day | TRANSDERMAL | Status: DC
  Filled 2023-05-22 (×5): qty 1

## 2023-05-22 MED ORDER — ENSURE ENLIVE PO LIQD
237.0000 mL | Freq: Two times a day (BID) | ORAL | Status: DC
  Administered 2023-05-23 – 2023-05-28 (×11): 237 mL via ORAL

## 2023-05-22 MED ORDER — HALOPERIDOL LACTATE 5 MG/ML IJ SOLN: 5.0000 mg | Freq: Three times a day (TID) | INTRAMUSCULAR | Status: DC | PRN

## 2023-05-22 MED ORDER — CLONAZEPAM 0.25 MG PO TBDP
1.0000 mg | ORAL_TABLET | Freq: Two times a day (BID) | ORAL | Status: AC
  Administered 2023-05-22: 1 mg via ORAL
  Filled 2023-05-22: qty 4

## 2023-05-22 MED ORDER — FLUOXETINE HCL 20 MG PO CAPS
40.0000 mg | ORAL_CAPSULE | Freq: Every day | ORAL | Status: DC
  Administered 2023-05-22 – 2023-05-29 (×8): 40 mg via ORAL
  Filled 2023-05-22 (×9): qty 2

## 2023-05-22 MED ORDER — HALOPERIDOL LACTATE 5 MG/ML IJ SOLN: 10.0000 mg | Freq: Three times a day (TID) | INTRAMUSCULAR | Status: DC | PRN

## 2023-05-22 MED ORDER — HYDROXYZINE HCL 25 MG PO TABS: 25.0000 mg | ORAL_TABLET | Freq: Three times a day (TID) | ORAL | Status: DC | PRN

## 2023-05-22 MED ORDER — LORAZEPAM 2 MG/ML IJ SOLN: 2.0000 mg | Freq: Three times a day (TID) | INTRAMUSCULAR | Status: DC | PRN

## 2023-05-22 MED ORDER — ACETAMINOPHEN 325 MG PO TABS: 650.0000 mg | ORAL_TABLET | Freq: Four times a day (QID) | ORAL | Status: DC | PRN

## 2023-05-22 MED ORDER — TRAZODONE HCL 50 MG PO TABS
50.0000 mg | ORAL_TABLET | Freq: Every evening | ORAL | Status: DC | PRN
  Filled 2023-05-22 (×2): qty 1

## 2023-05-22 MED ORDER — ARIPIPRAZOLE 5 MG PO TABS: 15.0000 mg | ORAL_TABLET | Freq: Every day | ORAL | Status: DC

## 2023-05-22 MED ORDER — CLONAZEPAM 0.125 MG PO TBDP
0.1250 mg | ORAL_TABLET | Freq: Two times a day (BID) | ORAL | Status: AC
  Administered 2023-05-25 – 2023-05-26 (×2): 0.125 mg via ORAL
  Filled 2023-05-22 (×3): qty 1

## 2023-05-22 MED ORDER — DIPHENHYDRAMINE HCL 50 MG/ML IJ SOLN: 50.0000 mg | Freq: Three times a day (TID) | INTRAMUSCULAR | Status: DC | PRN

## 2023-05-22 MED ORDER — ARIPIPRAZOLE 5 MG PO TABS
5.0000 mg | ORAL_TABLET | Freq: Every day | ORAL | Status: DC
  Administered 2023-05-22 – 2023-05-29 (×8): 5 mg via ORAL
  Filled 2023-05-22 (×8): qty 1

## 2023-05-22 MED ORDER — MELATONIN 5 MG PO TABS
2.5000 mg | ORAL_TABLET | Freq: Every day | ORAL | Status: DC
  Administered 2023-05-22 – 2023-05-28 (×6): 2.5 mg via ORAL
  Filled 2023-05-22 (×7): qty 1

## 2023-05-22 MED ORDER — HALOPERIDOL 5 MG PO TABS: 5.0000 mg | ORAL_TABLET | Freq: Three times a day (TID) | ORAL | Status: DC | PRN

## 2023-05-22 MED ORDER — MAGNESIUM HYDROXIDE 400 MG/5ML PO SUSP: 30.0000 mL | Freq: Every day | ORAL | Status: DC | PRN

## 2023-05-22 MED ORDER — DIPHENHYDRAMINE HCL 25 MG PO CAPS: 50.0000 mg | ORAL_CAPSULE | Freq: Three times a day (TID) | ORAL | Status: DC | PRN
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
## 2023-05-22 NOTE — Progress Notes (Signed)
 Pt spent the entire day in her bed with the exception for meals in which she had to be cohered to come out and eat. Pt is mute for the most part, she would basely, nod or shake her head and stares anxiously when approach.       05/22/2023     1300     Psych Admission Type (Psych Patients Only)  Admission Status Voluntary    Psychosocial Assessment   Patient Complaints Sadness    Eye Contact Glaring    Facial Expression Worried    Affect Depressed    Speech Soft; Elective mutism    Interaction Isolative; Guarded    Motor Activity Slow    Appearance/Hygiene In scrubs    Behavior Characteristics Guarded    Mood Depressed; Apprehensive    Thought Process   Coherency Unable to assess    Content UTA    Delusions UTA    Perception UTA    Hallucination UTA    Judgment Impaired    Confusion UTA    Danger to Self   Current suicidal ideation? Denies

## 2023-05-22 NOTE — Progress Notes (Signed)
 Patient continues to isolate to self and room. Will answer yes and no questions but will not elaborate. Does not come out of room for meds or snack. Medications taken to patient. Taken without incident. Denies SI, HI, AVH. Contracts for safety. No interaction with peers.  Encouragement and support provided, safety checks maintained. Medications given as prescribed. Pt receptive and remains safe on unit with q 15 min checks.

## 2023-05-22 NOTE — Plan of Care (Signed)
  Problem: Education: Goal: Emotional status will improve 05/22/2023 2155 by Joshua Clarita BRAVO, RN Outcome: Progressing 05/22/2023 2153 by Joshua Clarita BRAVO, RN Outcome: Progressing Goal: Mental status will improve 05/22/2023 2155 by Joshua Clarita BRAVO, RN Outcome: Progressing 05/22/2023 2153 by Joshua Clarita BRAVO, RN Outcome: Progressing   Problem: Activity: Goal: Interest or engagement in activities will improve Outcome: Not Progressing   Problem: Coping: Goal: Ability to verbalize frustrations and anger appropriately will improve 05/22/2023 2155 by Joshua Clarita BRAVO, RN Outcome: Not Progressing 05/22/2023 2153 by Joshua Clarita BRAVO, RN Outcome: Progressing Goal: Ability to demonstrate self-control will improve Outcome: Progressing   Problem: Safety: Goal: Periods of time without injury will increase Outcome: Progressing

## 2023-05-22 NOTE — Group Note (Signed)
 LCSW Group Therapy Note   Group Date: 05/22/2023 Start Time: 1300 End Time: 1400   Type of Therapy and Topic:  Group Therapy: Challenging Core Beliefs  Participation Level:  Did Not Attend  Description of Group:  Patients were educated about core beliefs and asked to identify one harmful core belief that they have. Patients were asked to explore from where those beliefs originate. Patients were asked to discuss how those beliefs make them feel and the resulting behaviors of those beliefs. They were then be asked if those beliefs are true and, if so, what evidence they have to support them. Lastly, group members were challenged to replace those negative core beliefs with helpful beliefs.   Therapeutic Goals:   1. Patient will identify harmful core beliefs and explore the origins of such beliefs. 2. Patient will identify feelings and behaviors that result from those core beliefs. 3. Patient will discuss whether such beliefs are true. 4.  Patient will replace harmful core beliefs with helpful ones.  Summary of Patient Progress:  Patient did not attend.   Therapeutic Modalities: Cognitive Behavioral Therapy; Solution-Focused Therapy   Isabella [name], LCSWA 05/22/2023  2:17 PM

## 2023-05-22 NOTE — Group Note (Signed)
 Recreation Therapy Group Note   Group Topic:Coping Skills  Group Date: 05/22/2023 Start Time: 1000 End Time: 1045 Facilitators: Celestia Jeoffrey BRAVO, LRT, CTRS Location:  Craft Room  Group Description: Mind Map.  Patient was provided a blank template of a diagram with 32 blank boxes in a tiered system, branching from the center (similar to a bubble chart). LRT directed patients to label the middle of the diagram Coping Skills. LRT and patients then came up with 8 different coping skills as examples. Pt were directed to record their coping skills in the 2nd tier boxes closest to the center.  Patients would then share their coping skills with the group as LRT wrote them out. LRT gave a handout of 99 different coping skills at the end of group.   Goal Area(s) Addressed: Patients will be able to define "coping skills". Patient will identify new coping skills.  Patient will increase communication.   Affect/Mood: N/A   Participation Level: Did not attend    Clinical Observations/Individualized Feedback: Patient did not attend group.   Plan: Continue to engage patient in RT group sessions 2-3x/week.   Jeoffrey BRAVO Celestia, LRT, CTRS 05/22/2023 11:30 AM

## 2023-05-22 NOTE — BHH Counselor (Signed)
 During interview to complete PSA, pt stated that she was interested in referral for follow up care. However, she declined to sign consent form, sharing she would sign it at the end. No other concerns expressed. Contact ended without incident.   Nadara SAUNDERS. Chaim, MSW, LCSW, LCAS 05/22/2023 11:06 AM

## 2023-05-22 NOTE — Progress Notes (Signed)
 Pt sent the day in her room, she declined to eat bkft. And lunch but, ate dinner with [employer] of [employer]. Pt have been selectively mute.   05/22/23 1300  Psych Admission Type (Psych Patients Only)  Admission Status Voluntary  Psychosocial Assessment  Patient Complaints Sadness  Eye Contact Glaring  Facial Expression Worried  Affect Depressed  Speech Soft;Elective mutism  Interaction Isolative;Guarded  Motor Activity Slow  Appearance/Hygiene In scrubs  Behavior Characteristics Guarded  Mood Depressed;Apprehensive  Thought Process  Coherency Unable to assess  Content UTA  Delusions UTA  Perception UTA  Hallucination UTA  Judgment Impaired  Confusion UTA  Danger to Self  Current suicidal ideation? Denies  Agreement Not to Harm Self Yes  Description of Agreement Acknowledged

## 2023-05-22 NOTE — ED Notes (Signed)
 Report given to [place], [place].

## 2023-05-22 NOTE — Progress Notes (Signed)
 Patient admitted from ED under voluntarily  commitment.  Patient was just in inpatient psych unit two weeks ago. Patient endorses depression, she has a history of catatonia, she currently denies SI, HI, AH & VH.  She was cooperative with admission.

## 2023-05-22 NOTE — BHH Counselor (Signed)
 Adult Comprehensive Assessment  Patient ID: Isabella [name], female   DOB: 23-Mar-1976, 48 y.o.   MRN: [id number]  Information Source: Information source: Patient  Current Stressors:  Patient states their primary concerns and needs for treatment are:: I felt depressed. Patient states their goals for this hospitilization and ongoing recovery are:: Depression. Educational / Learning stressors: None reported Employment / Job issues: Pt is unemployed. Family Relationships: None reported Financial / Lack of resources (include bankruptcy): Pt is unemployed. Housing / Lack of housing: She has stable housing. Physical health (include injuries & life threatening diseases): None reported Social relationships: None reported Substance abuse: None reported Bereavement / Loss: None reported.  Living/Environment/Situation:  Living Arrangements: Parent Who else lives in the home?: Pt lives with her mother. How long has patient lived in current situation?: For a couple years. What is atmosphere in current home: Comfortable, Loving, Supportive  Family History:  Marital status: Separated Separated, when?: For a couple years. What types of issues is patient dealing with in the relationship?: Some problems. Are you sexually active?:  (Unable to assess) What is your sexual orientation?: Unable to assess. Has your sexual activity been affected by drugs, alcohol, medication, or emotional stress?: Unable to assess. Does patient have children?: Yes How many children?: 2 How is patient's relationship with their children?: Usually I don't talk with them.  Childhood History:  By whom was/is the patient raised?: Both parents Additional childhood history information: She describes her childhood at good. Description of patient's relationship with caregiver when they were a child: Good Patient's description of current relationship with people who raised him/her: Good How were you  disciplined when you got in trouble as a child/adolescent?: By myself. Does patient have siblings?: Yes Number of Siblings: 2 (Older siblings) Description of patient's current relationship with siblings: Good Did patient suffer any verbal/emotional/physical/sexual abuse as a child?: No Did patient suffer from severe childhood neglect?: No Has patient ever been sexually abused/assaulted/raped as an adolescent or adult?: No Was the patient ever a victim of a crime or a disaster?: No Witnessed domestic violence?: No Has patient been affected by domestic violence as an adult?: No  Education:  Highest grade of school patient has completed: Eighth grade Currently a student?: No Learning disability?: No  Employment/Work Situation:   Employment Situation: On disability Why is Patient on Disability: Unable to assess How Long has Patient Been on Disability: Unable to assess Patient's Job has Been Impacted by Current Illness:  (When asked pt replied, Probably.) What is the Longest Time Patient has Held a Job?: Unable to assess. Where was the Patient Employed at that Time?: Unable to assess. Has Patient ever Been in the [employer]?: No  Financial Resources:   Financial resources: Insurance Claims Handler, [place] Does patient have a lawyer or guardian?: No  Alcohol/Substance Abuse:   What has been your use of drugs/alcohol within the last 12 months?: Pt denies any use of drugs or alcohol. UDS positive for benzos. If attempted suicide, did drugs/alcohol play a role in this?:  (Unable to assess) Alcohol/Substance Abuse Treatment Hx: Denies past history If yes, describe treatment: N/A Has alcohol/substance abuse ever caused legal problems?: No  Social Support System:   Patient's Community Support System: Good Describe Community Support System: I got some friends. Type of faith/religion: Catholic How does patient's faith help to cope with current illness?: Pt denies any  practices.  Leisure/Recreation:   Do You Have Hobbies?: No  Strengths/Needs:   What is the patient's perception of  their strengths?: I don't know. Patient states they can use these personal strengths during their treatment to contribute to their recovery: N/A Patient states these barriers may affect/interfere with their treatment: None reported Patient states these barriers may affect their return to the community: None reported  Discharge Plan:   Currently receiving community mental health services: No Patient states concerns and preferences for aftercare planning are: She shares that she was seeing someone in the past but is unable to recall who/where she was seen. Pt open to referral upon discharge but says she will sign consents at end of treatment. Patient states they will know when they are safe and ready for discharge when: I don't know. Does patient have access to transportation?: No Does patient have financial barriers related to discharge medications?: No Plan for no access to transportation at discharge: CSW will assist with transportation arrangements. Will patient be returning to same living situation after discharge?: Yes  Summary/Recommendations:   Summary and Recommendations (to be completed by the evaluator): Patient is a 48 year old, separated, female from [place], [place] [facility]).  She presented to the ED for nausea, decreased appetite, and abdominal pain for 15 days. While in triage, sister shared that pt had not been eating due to increased depression. Pt shared that she came into hospital due to depression and that her goal is to work on this. She lives at home with her mother and plans to return there upon discharge. Pt denied any history of abuse or trauma. She stated that she was unable to recall who she received outpatient treatment from in the past but is open to referral upon discharge. However, pt was unable or unwilling to sign consents at this time,  stating that she would sign them at the end of treatment. Pt declined familial/collateral contact during interview to complete assessment. Noted that pt has a history of Schizophrenia, Bipolar Disorder and a psychotic break. Previously noted, pt wanted to resume services with the [facility] in [facility].  Recommendations include: crisis stabilization, therapeutic milieu, encourage group attendance and participation, medication management for mood stabilization and development of comprehensive mental wellness plan.  Nadara JONELLE Fam. 05/22/2023

## 2023-05-22 NOTE — Group Note (Signed)
 Date:  05/22/2023 Time:  8:40 PM  Group Topic/Focus:  Wellness Toolbox:   The focus of this group is to discuss various aspects of wellness, balancing those aspects and exploring ways to increase the ability to experience wellness.  Patients will create a wellness toolbox for use upon discharge. Wrap-Up Group:   The focus of this group is to help patients review their daily goal of treatment and discuss progress on daily workbooks.    Participation Level:  Did Not Attend    Isabella [name] 05/22/2023, 8:40 PM

## 2023-05-22 NOTE — B[facility] Suicide Risk Assessment (Signed)
 [facility] INPATIENT:  Family/Significant Other Suicide Prevention Education  Suicide Prevention Education:  Patient Refusal for Family/Significant Other Suicide Prevention Education: The patient Isabella [name] has refused to provide written consent for family/significant other to be provided Family/Significant Other Suicide Prevention Education during admission and/or prior to discharge.  Physician notified.  SPE completed with pt, as pt refused to consent to family contact. SPI pamphlet provided to pt and pt was encouraged to share information with support network, ask questions, and talk about any concerns relating to SPE. Pt denies access to guns/firearms and verbalized understanding of information provided. Mobile Crisis information also provided to pt.  Nadara JONELLE Fam 05/22/2023, 11:05 AM

## 2023-05-22 NOTE — [facility] Suicide Risk Assessment (Signed)
 [facility] Admission Suicide Risk Assessment   Nursing information obtained from:  Patient Demographic factors:  Low socioeconomic status Current Mental Status:  NA Loss Factors:  NA Historical Factors:  Family history of mental illness or substance abuse Risk Reduction Factors:     Total Time spent with patient: 2 hours Principal Problem: Bipolar 1 disorder (HCC) Diagnosis:  Principal Problem:   Bipolar 1 disorder (HCC)  Subjective Data:[age]-year-old Hispanic female presenting with a 15-day history of worsening depression, marked by decreased appetite, nausea, abdominal pain, and significant weight loss. She reports persistent sadness and isolation. 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 The patient has not taken any medications since her discharge in December, including prior Ativan  (Lorazepam ) use. Collateral Input: The patient's sister states, She hasn't been eating much and just wants to lie down all day.The patient reports feeling overwhelmed by her symptoms and states, I need help, but I feel stuck. She denies substance use or alcohol consumption but acknowledges reduced motivation and isolation.   Continued Clinical Symptoms:  Alcohol Use Disorder Identification Test Final Score (AUDIT): 3 The Alcohol Use Disorders Identification Test, Guidelines for Use in Primary Care, Second Edition.  World Science Writer [facility]). Score between 0-7:  no or low risk or alcohol related problems. Score between 8-15:  moderate risk of alcohol related problems. Score between 16-19:  high risk of  alcohol related problems. Score 20 or above:  warrants further diagnostic evaluation for alcohol dependence and treatment.   CLINICAL FACTORS:   Bipolar Disorder:   Depressive phase Schizophrenia:   Depressive state Paranoid or undifferentiated type   Musculoskeletal: Strength & Muscle Tone: within normal limits Gait & Station: normal Patient leans: N/A  Psychiatric Specialty Exam:  Presentation  General Appearance:  Fairly Groomed  Eye Contact: Minimal  Speech: Garbled  Speech Volume: Decreased  Handedness: Right   Mood and Affect  Mood: Dysphoric  Affect: Depressed; Non-Congruent   Thought Process  Thought Processes: Disorganized  Descriptions of Associations:Tangential  Orientation:Partial  Thought Content:Scattered  History of Schizophrenia/Schizoaffective disorder:Yes  Duration of Psychotic Symptoms:No data recorded Hallucinations:Hallucinations: Auditory Description of Auditory Hallucinations: 'mumbling  Ideas of Reference:None  Suicidal Thoughts:Suicidal Thoughts: No  Homicidal Thoughts:Homicidal Thoughts: No   Sensorium  Memory: Immediate Poor; Remote Poor  Judgment: Impaired  Insight: Poor   Executive Functions  Concentration: Poor  Attention Span: Poor  Recall: Poor  Fund of Knowledge: Poor  Language: Poor   Psychomotor Activity  Psychomotor Activity:Psychomotor Activity: Normal   Assets  Assets: Housing; Social Support   Sleep  Sleep:Sleep: Fair Number of Hours of Sleep: 5    Physical Exam: Physical Exam Vitals and nursing note reviewed.  Constitutional:      Appearance: Normal appearance.  HENT:     Head: Normocephalic and atraumatic.     Nose: Nose normal.  Pulmonary:     Effort: Pulmonary effort is normal.  Musculoskeletal:        General: Normal range of motion.     Cervical back: Normal range of motion.  Neurological:     General: No focal deficit present.     Mental Status: She  is alert. Mental status is at baseline.  Psychiatric:  Attention and Perception: She perceives auditory hallucinations.        Mood and Affect: Affect is blunt and flat.        Speech: Speech is delayed.        Behavior: Behavior is withdrawn. Behavior is cooperative.        Thought Content: Thought content normal.        Cognition and Memory: Cognition is impaired. Memory is impaired. She exhibits impaired recent memory and impaired remote memory.        Judgment: Judgment is impulsive.    Review of Systems  Psychiatric/Behavioral:  Positive for depression and hallucinations. The patient has insomnia.   All other systems reviewed and are negative.  Blood pressure 123/86, pulse 67, temperature (!) 97.2 F (36.2 C), temperature source Oral, resp. rate 18, height 5' 4 (1.626 m), weight 78.2 kg, SpO2 100%. Body mass index is 29.59 kg/m.   COGNITIVE FEATURES THAT CONTRIBUTE TO RISK:  Thought constriction (tunnel vision)    SUICIDE RISK:   Minimal: No identifiable suicidal ideation.  Patients presenting with no risk factors but with morbid ruminations; may be classified as minimal risk based on the severity of the depressive symptoms  PLAN OF CARE:  Fluoxetine  (Prozac ) 40 mg daily depressive symptoms. Lamotrigine  (Lamictal ) 25 mg daily mood stabilization, particularly depressive features of bipolar disorder. Clonazepam  (Klonopin ) taper ue to previous Ativan  use. Abilify  5 mg daily or mood stabilization and possible catatonic features. Educate the family on recognizing warning signs of relapse or worsening symptoms. Introduce group therapy for social interaction and support. Monitor for signs of benzodiazepine withdrawal, including tremors, agitation, or rebound anxiety. Place on suicide precautions with routine checks despite denial of suicidal ideation.  I certify that inpatient services furnished can reasonably be expected to improve the patient's condition.   Brad GORMAN Moats,  NP 05/22/2023, 3:16 PM

## 2023-05-22 NOTE — Plan of Care (Signed)
  Problem: Education: Goal: Knowledge of [facility] Education information/materials will improve Outcome: Not Met (add Reason) Goal: Emotional status will improve Outcome: Not Met (add Reason) Goal: Mental status will improve Outcome: Not Met (add Reason) Goal: Verbalization of understanding the information provided will improve Outcome: Not Met (add Reason)   Problem: Activity: Goal: Interest or engagement in activities will improve Outcome: Not Met (add Reason) Goal: Sleeping patterns will improve Outcome: Not Met (add Reason)   Problem: Coping: Goal: Ability to verbalize frustrations and anger appropriately will improve Outcome: Not Met (add Reason) Goal: Ability to demonstrate self-control will improve Outcome: Not Met (add Reason)   Problem: Safety: Goal: Periods of time without injury will increase Outcome: Not Met (add Reason)   Problem: Education: Goal: Utilization of techniques to improve thought processes will improve Outcome: Not Met (add Reason) Goal: Knowledge of the prescribed therapeutic regimen will improve Outcome: Not Met (add Reason)   Problem: Coping: Goal: Coping ability will improve Outcome: Not Met (add Reason) Goal: Will verbalize feelings Outcome: Not Met (add Reason)   Problem: Safety: Goal: Ability to disclose and discuss suicidal ideas will improve Outcome: Not Met (add Reason) Goal: Ability to identify and utilize support systems that promote safety will improve Outcome: Not Met (add Reason)

## 2023-05-22 NOTE — Plan of Care (Signed)
  Problem: Education: Goal: Emotional status will improve Outcome: Progressing Goal: Mental status will improve Outcome: Progressing   Problem: Coping: Goal: Ability to verbalize frustrations and anger appropriately will improve Outcome: Progressing   

## 2023-05-22 NOTE — BH IP Treatment Plan (Signed)
 Interdisciplinary Treatment and Diagnostic Plan Update  05/22/2023 Time of Session: 9:29AM Isabella [name] MRN: [id number]  Principal Diagnosis: Bipolar 1 disorder [facility])  Secondary Diagnoses: Principal Problem:   Bipolar 1 disorder (HCC)   Current Medications:  Current Facility-Administered Medications  Medication Dose Route Frequency Provider Last Rate Last Admin   acetaminophen  (TYLENOL ) tablet 650 mg  650 mg Oral Q6H PRN Melvenia Madelene HERO, NP       alum & mag hydroxide-simeth (MAALOX/MYLANTA) 200-200-20 MG/5ML suspension 30 mL  30 mL Oral Q4H PRN Dixon, Rashaun M, NP       haloperidol  (HALDOL ) tablet 5 mg  5 mg Oral TID PRN Melvenia Madelene HERO, NP       And   diphenhydrAMINE  (BENADRYL ) capsule 50 mg  50 mg Oral TID PRN Melvenia Madelene HERO, NP       haloperidol  lactate (HALDOL ) injection 5 mg  5 mg Intramuscular TID PRN Melvenia Madelene HERO, NP       And   diphenhydrAMINE  (BENADRYL ) injection 50 mg  50 mg Intramuscular TID PRN Melvenia Madelene HERO, NP       And   LORazepam  (ATIVAN ) injection 2 mg  2 mg Intramuscular TID PRN Melvenia Madelene HERO, NP       haloperidol  lactate (HALDOL ) injection 10 mg  10 mg Intramuscular TID PRN Melvenia Madelene HERO, NP       And   diphenhydrAMINE  (BENADRYL ) injection 50 mg  50 mg Intramuscular TID PRN Melvenia Madelene HERO, NP       And   LORazepam  (ATIVAN ) injection 2 mg  2 mg Intramuscular TID PRN Melvenia Madelene HERO, NP       hydrOXYzine  (ATARAX ) tablet 25 mg  25 mg Oral TID PRN Melvenia Madelene HERO, NP       magnesium  hydroxide (MILK OF MAGNESIA) suspension 30 mL  30 mL Oral Daily PRN Dixon, Rashaun M, NP       traZODone  (DESYREL ) tablet 50 mg  50 mg Oral QHS PRN Dixon, Madelene HERO, NP       PTA Medications: Medications Prior to Admission  Medication Sig Dispense Refill Last Dose/Taking   ARIPiprazole  (ABILIFY ) 15 MG tablet Take 1 tablet (15 mg total) by mouth daily. 30 tablet 0    FLUoxetine  (PROZAC ) 20 MG capsule Take 1 capsule (20 mg total) by mouth daily. 30 capsule  0    hydrOXYzine  (ATARAX ) 25 MG tablet Take 1 tablet (25 mg total) by mouth every 6 (six) hours as needed for anxiety. 30 tablet 0    LORazepam  (ATIVAN ) 1 MG tablet Take 1 tablet (1 mg total) by mouth 2 (two) times daily. 60 tablet 0    melatonin 5 MG TABS Take 1 tablet (5 mg total) by mouth at bedtime. 30 tablet 0    nicotine  (NICODERM CQ  - DOSED IN MG/24 HOURS) 21 mg/24hr patch Place 1 patch (21 mg total) onto the skin daily. (Patient not taking: Reported on 05/21/2023) 28 patch 0    polyethylene glycol (MIRALAX  / GLYCOLAX ) 17 g packet Take 17 g by mouth 2 (two) times daily. (Patient not taking: Reported on 05/21/2023) 14 each 0     Patient Stressors:    Patient Strengths:    Treatment Modalities: Medication Management, Group therapy, Case management,  1 to 1 session with clinician, Psychoeducation, Recreational therapy.   Physician Treatment Plan for Primary Diagnosis: Bipolar 1 disorder (HCC) Long Term Goal(s):     Short Term Goals:    Medication Management: Evaluate patient's response,  side effects, and tolerance of medication regimen.  Therapeutic Interventions: 1 to 1 sessions, Unit Group sessions and Medication administration.  Evaluation of Outcomes: Not Met  Physician Treatment Plan for Secondary Diagnosis: Principal Problem:   Bipolar 1 disorder (HCC)  Long Term Goal(s):     Short Term Goals:       Medication Management: Evaluate patient's response, side effects, and tolerance of medication regimen.  Therapeutic Interventions: 1 to 1 sessions, Unit Group sessions and Medication administration.  Evaluation of Outcomes: Not Met   RN Treatment Plan for Primary Diagnosis: Bipolar 1 disorder (HCC) Long Term Goal(s): Knowledge of disease and therapeutic regimen to maintain health will improve  Short Term Goals: Ability to verbalize frustration and anger appropriately will improve, Ability to demonstrate self-control, Ability to participate in decision making will improve,  Ability to verbalize feelings will improve, Ability to disclose and discuss suicidal ideas, and Ability to identify and develop effective coping behaviors will improve  Medication Management: RN will administer medications as ordered by provider, will assess and evaluate patient's response and provide education to patient for prescribed medication. RN will report any adverse and/or side effects to prescribing provider.  Therapeutic Interventions: 1 on 1 counseling sessions, Psychoeducation, Medication administration, Evaluate responses to treatment, Monitor vital signs and CBGs as ordered, Perform/monitor CIWA, COWS, AIMS and Fall Risk screenings as ordered, Perform wound care treatments as ordered.  Evaluation of Outcomes: Not Met   LCSW Treatment Plan for Primary Diagnosis: Bipolar 1 disorder (HCC) Long Term Goal(s): Safe transition to appropriate next level of care at discharge, Engage patient in therapeutic group addressing interpersonal concerns.  Short Term Goals: Engage patient in aftercare planning with referrals and resources, Increase social support, Increase ability to appropriately verbalize feelings, Increase emotional regulation, Facilitate acceptance of mental health diagnosis and concerns, Facilitate patient progression through stages of change regarding substance use diagnoses and concerns, Identify triggers associated with mental health/substance abuse issues, and Increase skills for wellness and recovery  Therapeutic Interventions: Assess for all discharge needs, 1 to 1 time with Social worker, Explore available resources and support systems, Assess for adequacy in community support network, Educate family and significant other(s) on suicide prevention, Complete Psychosocial Assessment, Interpersonal group therapy.  Evaluation of Outcomes: Not Met   Progress in Treatment: Attending groups: No. Participating in groups: No. Taking medication as prescribed: Yes. and  No. Toleration medication: Yes. and No. Family/Significant other contact made: No, will contact:  CSW to contact once permission is granted.  Patient understands diagnosis: Yes. Discussing patient identified problems/goals with staff: Yes. Medical problems stabilized or resolved: Yes. and No. Denies suicidal/homicidal ideation: Yes. and No. Issues/concerns per patient self-inventory: No. Other: None  New problem(s) identified: No, Describe:  None  New Short Term/Long Term Goal(s):detox, elimination of symptoms of psychosis, medication management for mood stabilization; elimination of SI thoughts; development of comprehensive mental wellness/sobriety plan.    Patient Goals:  Patient reports she wants to work on her depression.   Discharge Plan or Barriers: CSW to assist in the development of appropriate discharge plan.   Reason for Continuation of Hospitalization: Anxiety Depression Medication stabilization Suicidal ideation  Estimated Length of Stay:1-7 days.   Last 3 [place] Suicide Severity Risk Score: Flowsheet Row Admission (Current) from 05/22/2023 in [facility]PATIENT BE[facility]AL MEDICINE ED from [facility]/20/2023 in [facility] Department at [facility] Admission (Discharged) from 04/28/2023 in [facility] INPATIENT BEHAVIORAL MEDICINE  C-SSRS RISK CATEGORY No Risk No Risk No Risk  Last PHQ 2/9 Scores:     No data to display          Scribe for Treatment Team: Stanislawa Gaffin M Gustavia Carie, KEN 05/22/2023 9:54 AM

## 2023-05-23 DIAGNOSIS — F319 Bipolar disorder, unspecified: Secondary | ICD-10-CM | POA: Diagnosis not present

## 2023-05-23 MED ORDER — CLONAZEPAM 1 MG PO TABS
1.0000 mg | ORAL_TABLET | Freq: Once | ORAL | Status: AC
  Administered 2023-05-23: 1 mg via ORAL
  Filled 2023-05-23: qty 1

## 2023-05-23 NOTE — Group Note (Signed)
 Date:  05/23/2023 Time:  9:44 PM  Group Topic/Focus:  Stages of Change:   The focus of this group is to explain the stages of change and help patients identify changes they want to make upon discharge. Wrap-Up Group:   The focus of this group is to help patients review their daily goal of treatment and discuss progress on daily workbooks.    Participation Level:  Did Not Attend  Additional Comments:  Pt only leaves room for meals  Maglione,Darivs Lunden E 05/23/2023, 9:44 PM
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
## 2023-05-23 NOTE — Group Note (Addendum)
 LCSW Group Therapy Note   Group Date: 05/23/2023 Start Time: 1330 End Time: 1430   Type of Therapy and Topic:  Group Therapy: Boundaries  Participation Level:  Did Not Attend  Description of Group: This group will address the use of boundaries in their personal lives. Patients will explore why boundaries are important, the difference between healthy and unhealthy boundaries, and negative and postive outcomes of different boundaries and will look at how boundaries can be crossed.  Patients will be encouraged to identify current boundaries in their own lives and identify what kind of boundary is being set. Facilitators will guide patients in utilizing problem-solving interventions to address and correct types boundaries being used and to address when no boundary is being used. Understanding and applying boundaries will be explored and addressed for obtaining and maintaining a balanced life. Patients will be encouraged to explore ways to assertively make their boundaries and needs known to significant others in their lives, using other group members and facilitator for role play, support, and feedback.  Therapeutic Goals:  1.  Patient will identify areas in their life where setting clear boundaries could be  used to improve their life.  2.  Patient will identify signs/triggers that a boundary is not being respected. 3.  Patient will identify two ways to set boundaries in order to achieve balance in  their lives: 4.  Patient will demonstrate ability to communicate their needs and set boundaries  through discussion and/or role plays  Summary of Patient Progress:   Patient declined to attend group.   Therapeutic Modalities:   Cognitive Behavioral Therapy Solution-Focused Therapy  Sherryle JINNY Margo, LCSW 05/23/2023  3:08 PM

## 2023-05-23 NOTE — Progress Notes (Signed)
 Pt isolative to her room tonight and minimal with this clinical research associate during assessment but did deny SI/HI/AVH. Pt compliant with medication administration per MD orders. Pt given education, support, and encouragement to be active in her treatment plan. Pt being monitored Q 15 minutes for safety per unit protocol, remains safe on the unit
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
## 2023-05-23 NOTE — Plan of Care (Signed)
 Patient isolated to her room. Patient got up for meals and meds. Patient denies SI,HI and AVH. Patient guarded and minimal interactions with staff & peers. Patient states " I am alright. Everything good." Support and encouragement given.

## 2023-05-24 DIAGNOSIS — F319 Bipolar disorder, unspecified: Secondary | ICD-10-CM | POA: Diagnosis not present

## 2023-05-24 MED ORDER — LAMOTRIGINE 100 MG PO TABS
100.0000 mg | ORAL_TABLET | Freq: Every day | ORAL | Status: DC
Start: 2023-05-25 — End: 2023-05-29
  Administered 2023-05-25 – 2023-05-29 (×5): 100 mg via ORAL
  Filled 2023-05-24 (×5): qty 1

## 2023-05-24 NOTE — Progress Notes (Addendum)
 Pt spent the entire day in bed with the exception for meal times and to get her medications and she had to be prompted to do so. Pt remain flat and depressed, she reported feeling better, sad. Pt is been maintained on q 15 min. rounds   05/24/23 1200  Psych Admission Type (Psych Patients Only)  Admission Status Voluntary  Psychosocial Assessment  Patient Complaints Anxiety  Eye Contact Brief  Facial Expression Sad  Affect Depressed  Speech Soft  Interaction Avoidant;Isolative  Motor Activity Slow  Appearance/Hygiene In scrubs  Behavior Characteristics Cooperative;Anxious  Mood Depressed;Anxious  Thought Process  Coherency WDL  Content WDL  Delusions None reported or observed  Perception WDL  Hallucination None reported or observed  Judgment Impaired  Confusion WDL  Danger to Self  Current suicidal ideation? Denies  Agreement Not to Harm Self Yes  Description of Agreement verbal

## 2023-05-24 NOTE — Group Note (Signed)
 Date:  05/24/2023 Time:  11:15 PM  Group Topic/Focus:  Personal Choices and Values:   The focus of this group is to help patients assess and explore the importance of values in their lives, how their values affect their decisions, how they express their values and what opposes their expression.    Participation Level:  Did Not Attend  Participation Quality:   none  Affect:   none  Cognitive:   none  Insight: None  Engagement in Group:   none  Modes of Intervention:   none  Additional Comments:  none   Aivah Putman 05/24/2023, 11:15 PM

## 2023-05-24 NOTE — Group Note (Signed)
 Date:  05/24/2023 Time:  5:47 PM  Group Topic/Focus:  Coping With Mental Health Crisis:   The purpose of this group is to help patients identify strategies for coping with mental health crisis.  Group discusses possible causes of crisis and ways to manage them effectively. Art Therapy video driven -Art therapy is an effective treatment for persons experiencing developmental, medical, educational, social or psychological impairment. A key goal in art therapy is to improve or restore the client's functioning and his/her sense of personal well being.    Participation Level:  Did Not Attend   Lurlean Niece 05/24/2023, 5:47 PM

## 2023-05-24 NOTE — Plan of Care (Signed)
 Pt improving since being admitted, more verbal with staff  Problem: Education: Goal: Knowledge of [facility] Education information/materials will improve Outcome: Progressing Goal: Emotional status will improve Outcome: Progressing Goal: Mental status will improve Outcome: Progressing Goal: Verbalization of understanding the information provided will improve Outcome: Progressing   Problem: Activity: Goal: Interest or engagement in activities will improve Outcome: Progressing Goal: Sleeping patterns will improve Outcome: Progressing   Problem: Coping: Goal: Ability to verbalize frustrations and anger appropriately will improve Outcome: Progressing Goal: Ability to demonstrate self-control will improve Outcome: Progressing   Problem: Health Behavior/Discharge Planning: Goal: Identification of resources available to assist in meeting health care needs will improve Outcome: Progressing Goal: Compliance with treatment plan for underlying cause of condition will improve Outcome: Progressing   Problem: Physical Regulation: Goal: Ability to maintain clinical measurements within normal limits will improve Outcome: Progressing   Problem: Safety: Goal: Periods of time without injury will increase Outcome: Progressing   Problem: Education: Goal: Utilization of techniques to improve thought processes will improve Outcome: Progressing Goal: Knowledge of the prescribed therapeutic regimen will improve Outcome: Progressing   Problem: Activity: Goal: Interest or engagement in leisure activities will improve Outcome: Progressing Goal: Imbalance in normal sleep/wake cycle will improve Outcome: Progressing   Problem: Coping: Goal: Coping ability will improve Outcome: Progressing Goal: Will verbalize feelings Outcome: Progressing   Problem: Health Behavior/Discharge Planning: Goal: Ability to make decisions will improve Outcome: Progressing Goal: Compliance with  therapeutic regimen will improve Outcome: Progressing   Problem: Role Relationship: Goal: Will demonstrate positive changes in social behaviors and relationships Outcome: Progressing   Problem: Safety: Goal: Ability to disclose and discuss suicidal ideas will improve Outcome: Progressing Goal: Ability to identify and utilize support systems that promote safety will improve Outcome: Progressing   Problem: Self-Concept: Goal: Will verbalize positive feelings about self Outcome: Progressing Goal: Level of anxiety will decrease Outcome: Progressing
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
## 2023-05-24 NOTE — Group Note (Signed)
 [facility] LCSW Group Therapy Note   Group Date: 05/24/2023 Start Time: 1300 End Time: 1440   Type of Therapy/Topic:  Group Therapy:  Emotion Regulation  Participation Level:  Did Not Attend    Description of Group:    The purpose of this group is to assist patients in learning to regulate negative emotions and experience positive emotions. Patients will be guided to discuss ways in which they have been vulnerable to their negative emotions. These vulnerabilities will be juxtaposed with experiences of positive emotions or situations, and patients challenged to use positive emotions to combat negative ones. Special emphasis will be placed on coping with negative emotions in conflict situations, and patients will process healthy conflict resolution skills.  Therapeutic Goals: Patient will identify two positive emotions or experiences to reflect on in order to balance out negative emotions:  Patient will label two or more emotions that they find the most difficult to experience:  Patient will be able to demonstrate positive conflict resolution skills through discussion or role plays:   Summary of Patient Progress: X   Therapeutic Modalities:   Cognitive Behavioral Therapy Feelings Identification Dialectical Behavioral Therapy   Nadara JONELLE Fam, LCSW

## 2023-05-24 NOTE — Progress Notes (Signed)
   05/24/23 2000  Psych Admission Type (Psych Patients Only)  Admission Status Voluntary  Psychosocial Assessment  Patient Complaints Anxiety  Eye Contact Brief  Facial Expression Sad;Worried  Affect Depressed  Speech Soft  Interaction Avoidant;Isolative  Motor Activity Slow  Appearance/Hygiene In scrubs  Behavior Characteristics Cooperative;Calm  Mood Depressed  Aggressive Behavior  Effect No apparent injury  Thought Process  Coherency WDL  Content WDL  Delusions None reported or observed  Perception WDL  Hallucination None reported or observed  Judgment Impaired  Confusion WDL  Danger to Self  Current suicidal ideation? Denies  Agreement Not to Harm Self Yes  Description of Agreement verbal  Danger to Others  Danger to Others None reported or observed

## 2023-05-24 NOTE — Group Note (Signed)
 Date:  05/24/2023 Time:  11:41 AM  Group Topic/Focus:  Goals Group:   The focus of this group is to help patients establish daily goals to achieve during treatment and discuss how the patient can incorporate goal setting into their daily lives to aide in recovery. Personal Choices and Values:   The focus of this group is to help patients assess and explore the importance of values in their lives, how their values affect their decisions, how they express their values and what opposes their expression. Rediscovering Joy:   The focus of this group is to explore various ways to relieve stress in a positive manner.    Participation Level:  Did Not Attend   Isabella [name] 05/24/2023, 11:41 AM

## 2023-05-25 DIAGNOSIS — F319 Bipolar disorder, unspecified: Secondary | ICD-10-CM | POA: Diagnosis not present

## 2023-05-25 NOTE — Group Note (Signed)
 Recreation Therapy Group Note   Group Topic:Goal Setting  Group Date: 05/25/2023 Start Time: 1000 End Time: 1100 Facilitators: Celestia Jeoffrey BRAVO, LRT, CTRS Location:  Craft Room  Group Description: Product/process Development Scientist. Patients were given many different magazines, a glue stick, markers, and a piece of cardstock paper. LRT and pts discussed the importance of having goals in life. LRT and pts discussed the difference between short-term and long-term goals, as well as what a SMART goal is. LRT encouraged pts to create a vision board, with images they picked and then cut out with safety scissors from the magazine, for themselves, that capture their short and long-term goals. LRT encouraged pts to show and explain their vision board to the group.   Goal Area(s) Addressed:  Patient will gain knowledge of short vs. long term goals.  Patient will identify goals for themselves. Patient will practice setting SMART goals. Patient will verbalize their goals to LRT and peers.   Affect/Mood: N/A   Participation Level: Did not attend    Clinical Observations/Individualized Feedback: Isabella [name] did not attend group.   Plan: Continue to engage patient in RT group sessions 2-3x/week.   Jeoffrey BRAVO Celestia, LRT, CTRS 05/25/2023 1:24 PM

## 2023-05-25 NOTE — Group Note (Signed)
 LCSW Group Therapy Note   Group Date: 05/25/2023 Start Time: 1300 End Time: 1425   Type of Therapy and Topic:  Group Therapy: Challenging Core Beliefs  Participation Level:  Did Not Attend  Description of Group:  Patients were educated about core beliefs and asked to identify one harmful core belief that they have. Patients were asked to explore from where those beliefs originate. Patients were asked to discuss how those beliefs make them feel and the resulting behaviors of those beliefs. They were then be asked if those beliefs are true and, if so, what evidence they have to support them. Lastly, group members were challenged to replace those negative core beliefs with helpful beliefs.   Therapeutic Goals:   1. Patient will identify harmful core beliefs and explore the origins of such beliefs. 2. Patient will identify feelings and behaviors that result from those core beliefs. 3. Patient will discuss whether such beliefs are true. 4.  Patient will replace harmful core beliefs with helpful ones.  Summary of Patient Progress:  Patient did not attend group.   Therapeutic Modalities: Cognitive Behavioral Therapy; Solution-Focused Therapy   Lezley Bedgood M Amandajo Gonder, LCSWA 05/25/2023  2:33 PM

## 2023-05-25 NOTE — Group Note (Signed)
 Date:  05/25/2023 Time:  9:56 PM  Group Topic/Focus:  Making Healthy Choices:   The focus of this group is to help patients identify negative/unhealthy choices they were using prior to admission and identify positive/healthier coping strategies to replace them upon discharge.    Participation Level:  Did Not Attend   Isabella [name] 05/25/2023, 9:56 PM

## 2023-05-25 NOTE — Plan of Care (Signed)
   Problem: Education: Goal: Emotional status will improve Outcome: Progressing Goal: Mental status will improve Outcome: Progressing

## 2023-05-25 NOTE — Group Note (Signed)
 Date:  05/25/2023 Time:  10:15 AM  Group Topic/Focus:  Goals Group:   The focus of this group is to help patients establish daily goals to achieve during treatment and discuss how the patient can incorporate goal setting into their daily lives to aide in recovery.    Participation Level:  Did Not Attend   Isabella [name] [facility] 05/25/2023, 10:15 AM
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
## 2023-05-25 NOTE — Group Note (Unsigned)
 Date:  05/25/2023 Time:  9:48 PM  Group Topic/Focus:  Making Healthy Choices:   The focus of this group is to help patients identify negative/unhealthy choices they were using prior to admission and identify positive/healthier coping strategies to replace them upon discharge.     Participation Level:  {BHH PARTICIPATION OZCZO:77735}  Participation Quality:  {BHH PARTICIPATION QUALITY:22265}  Affect:  {BHH AFFECT:22266}  Cognitive:  {BHH COGNITIVE:22267}  Insight: {BHH Insight2:20797}  Engagement in Group:  {BHH ENGAGEMENT IN HMNLE:77731}  Modes of Intervention:  {BHH MODES OF INTERVENTION:22269}  Additional Comments:  ***  Isabella [name] 05/25/2023, 9:48 PM

## 2023-05-25 NOTE — Progress Notes (Signed)
   05/25/23 1500  Psych Admission Type (Psych Patients Only)  Admission Status Voluntary  Psychosocial Assessment  Patient Complaints Anxiety  Eye Contact Brief  Facial Expression Sad;Worried  Affect Depressed  Speech Soft  Interaction Arrogant;Isolative  Motor Activity Slow  Appearance/Hygiene In scrubs  Behavior Characteristics Cooperative;Calm  Mood Depressed  Aggressive Behavior  Effect No apparent injury  Thought Process  Coherency WDL  Content WDL  Delusions None reported or observed  Perception WDL  Hallucination None reported or observed  Judgment Impaired  Confusion WDL  Danger to Self  Current suicidal ideation? Denies (Denies)  Agreement Not to Harm Self Yes  Description of Agreement Verbal  Danger to Others  Danger to Others None reported or observed

## 2023-05-25 NOTE — Plan of Care (Signed)
   Problem: Education: Goal: Knowledge of [facility] Education information/materials will improve Outcome: Progressing Goal: Emotional status will improve Outcome: Progressing Goal: Mental status will improve Outcome: Progressing Goal: Verbalization of understanding the information provided will improve Outcome: Progressing

## 2023-05-26 DIAGNOSIS — F319 Bipolar disorder, unspecified: Secondary | ICD-10-CM | POA: Diagnosis not present

## 2023-05-26 NOTE — Plan of Care (Signed)
 Patient appears with a sad affect but brightens on approach. Patient took shower and attended group today. Patient denies SI,HI and AVH. Patient visible in the milieu for some time. Compliant with medications.Appetite and energy level good. Support and encouragement given.

## 2023-05-26 NOTE — Group Note (Signed)
 Date:  05/26/2023 Time:  10:34 AM  Group Topic/Focus:  Goals Group:   The focus of this group is to help patients establish daily goals to achieve during treatment and discuss how the patient can incorporate goal setting into their daily lives to aide in recovery.    Participation Level:  Active  Participation Quality:  Appropriate  Affect:  Appropriate  Cognitive:  Appropriate  Insight: Appropriate  Engagement in Group:  Engaged  Modes of Intervention:  Discussion, Education, and Support  Additional Comments:    Deitra Caron Mainland 05/26/2023, 10:34 AM

## 2023-05-26 NOTE — Group Note (Signed)
 Recreation Therapy Group Note   Group Topic:Leisure Education  Group Date: 05/26/2023 Start Time: 1000 End Time: 1100 Facilitators: Celestia Jeoffrey BRAVO, LRT, CTRS Location:  Craft Room  Group Description: Leisure. Patients were given the option to choose from singing karaoke, coloring mandalas, using oil pastels, journaling, or playing with play-doh. LRT and pts discussed the meaning of leisure, the importance of participating in leisure during their free time/when they're outside of the hospital, as well as how our leisure interests can also serve as coping skills.   Goal Area(s) Addressed:  Patient will identify a current leisure interest.  Patient will learn the definition of "leisure". Patient will practice making a positive decision. Patient will have the opportunity to try a new leisure activity. Patient will communicate with peers and LRT.    Affect/Mood: Flat   Participation Level: Non-verbal    Clinical Observations/Individualized Feedback: Isabella [name] came to group halfway through. Pt did not interact with LRT or peers while in group. Pt was observed briefly coloring with markers.   Plan: Continue to engage patient in RT group sessions 2-3x/week.   Jeoffrey BRAVO Celestia, LRT, CTRS 05/26/2023 12:14 PM
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
## 2023-05-26 NOTE — Plan of Care (Signed)

## 2023-05-27 DIAGNOSIS — F319 Bipolar disorder, unspecified: Secondary | ICD-10-CM | POA: Diagnosis not present

## 2023-05-27 NOTE — Plan of Care (Signed)
  Problem: Education: Goal: Mental status will improve Outcome: Progressing   

## 2023-05-27 NOTE — Progress Notes (Signed)
   05/26/23 2000  Psych Admission Type (Psych Patients Only)  Admission Status Voluntary  Psychosocial Assessment  Patient Complaints None  Eye Contact Brief  Facial Expression Flat  Affect Appropriate to circumstance  Speech Soft  Interaction Assertive  Motor Activity Slow  Appearance/Hygiene Improved  Behavior Characteristics Cooperative;Calm  Mood Pleasant  Thought Process  Coherency WDL  Content WDL  Delusions None reported or observed  Perception WDL  Hallucination None reported or observed  Judgment Impaired  Confusion WDL  Danger to Self  Current suicidal ideation? Denies  Danger to Others  Danger to Others None reported or observed   Patient alert and oriented x 4, affect is congruent with mood, she appear less anxious , receptive to staff, with appropriate eye contact, she denies SI/HI/AVH. 15 minutes safety checks maintained will continue to monitor.

## 2023-05-27 NOTE — Plan of Care (Signed)
   Problem: Education: Goal: Mental status will improve Outcome: Progressing   Problem: Education: Goal: Knowledge of [facility] Education information/materials will improve Outcome: Progressing

## 2023-05-27 NOTE — Group Note (Signed)
 Date:  05/27/2023 Time:  3:26 AM  Group Topic/Focus:  Goals Group:   The focus of this group is to help patients establish daily goals to achieve during treatment and discuss how the patient can incorporate goal setting into their daily lives to aide in recovery.    Participation Level:  Did Not Attend  Isabella [name] 05/27/2023, 3:26 AM

## 2023-05-27 NOTE — Progress Notes (Signed)
 Patient was calm, cooperative and pleasant, compliant with treatment, isolative at times.  Denied SI HI AVH.  Continued monitoring for safety.  05/27/23 1800  Psych Admission Type (Psych Patients Only)  Admission Status Voluntary  Psychosocial Assessment  Patient Complaints None  Eye Contact Brief  Facial Expression Flat  Affect Appropriate to circumstance  Speech Soft  Interaction Isolative  Motor Activity Slow  Appearance/Hygiene Improved  Behavior Characteristics Calm  Mood Pleasant  Thought Process  Coherency WDL  Content WDL  Delusions None reported or observed  Perception WDL  Hallucination None reported or observed  Judgment Impaired  Confusion WDL  Danger to Self  Current suicidal ideation? Denies  Danger to Others  Danger to Others None reported or observed
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
## 2023-05-27 NOTE — Progress Notes (Signed)
   05/26/23 2000  Psych Admission Type (Psych Patients Only)  Admission Status Voluntary  Psychosocial Assessment  Patient Complaints None  Eye Contact Brief  Facial Expression Flat  Affect Appropriate to circumstance  Speech Soft  Interaction Assertive  Motor Activity Slow  Appearance/Hygiene Improved  Behavior Characteristics Cooperative;Calm  Mood Pleasant  Thought Process  Coherency WDL  Content WDL  Delusions None reported or observed  Perception WDL  Hallucination None reported or observed  Judgment Impaired  Confusion WDL  Danger to Self  Current suicidal ideation? Denies  Danger to Others  Danger to Others None reported or observed   Patient denies SI/HI/AVH, no distress noted, her thoughts are organized and coherent.

## 2023-05-28 DIAGNOSIS — F319 Bipolar disorder, unspecified: Secondary | ICD-10-CM | POA: Diagnosis not present

## 2023-05-28 NOTE — Group Note (Unsigned)
 Date:  05/28/2023 Time:  4:59 AM  Group Topic/Focus:  Wrap-Up Group:   The focus of this group is to help patients review their daily goal of treatment and discuss progress on daily workbooks.     Participation Level:  {BHH PARTICIPATION OZCZO:77735}  Participation Quality:  {BHH PARTICIPATION QUALITY:22265}  Affect:  {BHH AFFECT:22266}  Cognitive:  {BHH COGNITIVE:22267}  Insight: {BHH Insight2:20797}  Engagement in Group:  {BHH ENGAGEMENT IN HMNLE:77731}  Modes of Intervention:  {BHH MODES OF INTERVENTION:22269}  Additional Comments:  ***  Prentice Ace 05/28/2023, 4:59 AM

## 2023-05-28 NOTE — Plan of Care (Signed)
 Patient appears more pleasant on approach. Patient states " I am doing good." Patient up in the milieu for meals. Patient denies SI,HI and AVH. Compliant with medications. Support and encouragement given.

## 2023-05-28 NOTE — BH IP Treatment Plan (Signed)
 Interdisciplinary Treatment and Diagnostic Plan Update  05/28/2023 Time of Session: 3:28pm Syana Degraffenreid MRN: [id number]  Principal Diagnosis: Bipolar 1 disorder (HCC)  Secondary Diagnoses: Principal Problem:   Bipolar 1 disorder (HCC) Active Problems:   Depressed mood   Current Medications:  Current Facility-Administered Medications  Medication Dose Route Frequency Provider Last Rate Last Admin   acetaminophen  (TYLENOL ) tablet 650 mg  650 mg Oral Q6H PRN Melvenia Madelene HERO, NP       alum & mag hydroxide-simeth (MAALOX/MYLANTA) 200-200-20 MG/5ML suspension 30 mL  30 mL Oral Q4H PRN Melvenia Madelene HERO, NP       ARIPiprazole  (ABILIFY ) tablet 5 mg  5 mg Oral Daily Davis, Nina S, NP   5 mg at 05/28/23 0900   haloperidol  (HALDOL ) tablet 5 mg  5 mg Oral TID PRN Melvenia Madelene HERO, NP       And   diphenhydrAMINE  (BENADRYL ) capsule 50 mg  50 mg Oral TID PRN Melvenia Madelene HERO, NP       haloperidol  lactate (HALDOL ) injection 5 mg  5 mg Intramuscular TID PRN Melvenia Madelene HERO, NP       And   diphenhydrAMINE  (BENADRYL ) injection 50 mg  50 mg Intramuscular TID PRN Melvenia Madelene HERO, NP       And   LORazepam  (ATIVAN ) injection 2 mg  2 mg Intramuscular TID PRN Melvenia Madelene HERO, NP       haloperidol  lactate (HALDOL ) injection 10 mg  10 mg Intramuscular TID PRN Melvenia Madelene HERO, NP       And   diphenhydrAMINE  (BENADRYL ) injection 50 mg  50 mg Intramuscular TID PRN Melvenia Madelene HERO, NP       And   LORazepam  (ATIVAN ) injection 2 mg  2 mg Intramuscular TID PRN Melvenia Madelene HERO, NP       feeding supplement (ENSURE ENLIVE / ENSURE PLUS) liquid 237 mL  237 mL Oral BID BM Nicholaus Brad RAMAN, NP   237 mL at 05/27/23 1459   FLUoxetine  (PROZAC ) capsule 40 mg  40 mg Oral Daily Davis, Nina S, NP   40 mg at 05/28/23 0900   hydrOXYzine  (ATARAX ) tablet 25 mg  25 mg Oral TID PRN Melvenia Madelene HERO, NP       lamoTRIgine  (LAMICTAL ) tablet 100 mg  100 mg Oral Daily Nicholaus Brad RAMAN, NP   100 mg at 05/28/23 0900   magnesium   hydroxide (MILK OF MAGNESIA) suspension 30 mL  30 mL Oral Daily PRN Melvenia Madelene HERO, NP       melatonin tablet 2.5 mg  2.5 mg Oral QHS Nicholaus Brad RAMAN, NP   2.5 mg at 05/27/23 2140   nicotine  (NICODERM CQ  - dosed in mg/24 hours) patch 21 mg  21 mg Transdermal Daily Davis, Nina S, NP       traZODone  (DESYREL ) tablet 50 mg  50 mg Oral QHS PRN Melvenia Madelene HERO, NP       PTA Medications: Medications Prior to Admission  Medication Sig Dispense Refill Last Dose/Taking   ARIPiprazole  (ABILIFY ) 15 MG tablet Take 1 tablet (15 mg total) by mouth daily. 30 tablet 0    FLUoxetine  (PROZAC ) 20 MG capsule Take 1 capsule (20 mg total) by mouth daily. 30 capsule 0    hydrOXYzine  (ATARAX ) 25 MG tablet Take 1 tablet (25 mg total) by mouth every 6 (six) hours as needed for anxiety. 30 tablet 0    LORazepam  (ATIVAN ) 1 MG tablet Take 1 tablet (1 mg total) by  mouth 2 (two) times daily. 60 tablet 0    melatonin 5 MG TABS Take 1 tablet (5 mg total) by mouth at bedtime. 30 tablet 0    nicotine  (NICODERM CQ  - DOSED IN MG/24 HOURS) 21 mg/24hr patch Place 1 patch (21 mg total) onto the skin daily. (Patient not taking: Reported on 05/21/2023) 28 patch 0    polyethylene glycol (MIRALAX  / GLYCOLAX ) 17 g packet Take 17 g by mouth 2 (two) times daily. (Patient not taking: Reported on 05/21/2023) 14 each 0     Patient Stressors:    Patient Strengths:    Treatment Modalities: Medication Management, Group therapy, Case management,  1 to 1 session with clinician, Psychoeducation, Recreational therapy.   Physician Treatment Plan for Primary Diagnosis: Bipolar 1 disorder (HCC) Long Term Goal(s): Improvement in symptoms so as ready for discharge   Short Term Goals: Ability to identify changes in lifestyle to reduce recurrence of condition will improve Ability to verbalize feelings will improve Ability to disclose and discuss suicidal ideas Ability to demonstrate self-control will improve Ability to identify and develop effective  coping behaviors will improve Ability to maintain clinical measurements within normal limits will improve Compliance with prescribed medications will improve Ability to identify triggers associated with substance abuse/mental health issues will improve  Medication Management: Evaluate patient's response, side effects, and tolerance of medication regimen.  Therapeutic Interventions: 1 to 1 sessions, Unit Group sessions and Medication administration.  Evaluation of Outcomes: Progressing  Physician Treatment Plan for Secondary Diagnosis: Principal Problem:   Bipolar 1 disorder (HCC) Active Problems:   Depressed mood  Long Term Goal(s): Improvement in symptoms so as ready for discharge   Short Term Goals: Ability to identify changes in lifestyle to reduce recurrence of condition will improve Ability to verbalize feelings will improve Ability to disclose and discuss suicidal ideas Ability to demonstrate self-control will improve Ability to identify and develop effective coping behaviors will improve Ability to maintain clinical measurements within normal limits will improve Compliance with prescribed medications will improve Ability to identify triggers associated with substance abuse/mental health issues will improve     Medication Management: Evaluate patient's response, side effects, and tolerance of medication regimen.  Therapeutic Interventions: 1 to 1 sessions, Unit Group sessions and Medication administration.  Evaluation of Outcomes: Progressing   RN Treatment Plan for Primary Diagnosis: Bipolar 1 disorder (HCC) Long Term Goal(s): Knowledge of disease and therapeutic regimen to maintain health will improve  Short Term Goals: Ability to remain free from injury will improve, Ability to verbalize frustration and anger appropriately will improve, Ability to demonstrate self-control, Ability to participate in decision making will improve, Ability to verbalize feelings will improve,  Ability to disclose and discuss suicidal ideas, Ability to identify and develop effective coping behaviors will improve, and Compliance with prescribed medications will improve  Medication Management: RN will administer medications as ordered by provider, will assess and evaluate patient's response and provide education to patient for prescribed medication. RN will report any adverse and/or side effects to prescribing provider.  Therapeutic Interventions: 1 on 1 counseling sessions, Psychoeducation, Medication administration, Evaluate responses to treatment, Monitor vital signs and CBGs as ordered, Perform/monitor CIWA, COWS, AIMS and Fall Risk screenings as ordered, Perform wound care treatments as ordered.  Evaluation of Outcomes: Progressing   LCSW Treatment Plan for Primary Diagnosis: Bipolar 1 disorder (HCC) Long Term Goal(s): Safe transition to appropriate next level of care at discharge, Engage patient in therapeutic group addressing interpersonal concerns.  Short Term Goals:  Engage patient in aftercare planning with referrals and resources, Increase social support, Increase ability to appropriately verbalize feelings, Increase emotional regulation, Facilitate acceptance of mental health diagnosis and concerns, Facilitate patient progression through stages of change regarding substance use diagnoses and concerns, Identify triggers associated with mental health/substance abuse issues, and Increase skills for wellness and recovery  Therapeutic Interventions: Assess for all discharge needs, 1 to 1 time with Social worker, Explore available resources and support systems, Assess for adequacy in community support network, Educate family and significant other(s) on suicide prevention, Complete Psychosocial Assessment, Interpersonal group therapy.  Evaluation of Outcomes: Progressing   Progress in Treatment: Attending groups: Yes. Participating in groups: Yes. Taking medication as prescribed:  Yes. Toleration medication: Yes. Family/Significant other contact made: No, will contact:  When patient provides consent Patient understands diagnosis: Yes. Discussing patient identified problems/goals with staff: Yes. Medical problems stabilized or resolved: Yes. Denies suicidal/homicidal ideation: Yes. Issues/concerns per patient self-inventory: Yes. Other:   New problem(s) identified: No, Describe:  None   New Short Term/Long Term Goal(s):detox, elimination of symptoms of psychosis, medication management for mood stabilization; elimination of SI thoughts; development of comprehensive mental wellness/sobriety plan. Update: 05/28/2023: No changes at this time.   Patient Goals:  Patient reports she wants to work on her depression. Update: 05/28/2023: No changes at this time.   Discharge Plan or Barriers: CSW to assist in the development of appropriate discharge plan. Update: 05/28/2023: No changes at this time.   Reason for Continuation of Hospitalization: Anxiety Depression Medication stabilization Suicidal ideation   Estimated Length of Stay:1-7 days. Update: 05/28/2023: TBD . Last 3 [place] Suicide Severity Risk Score: Flowsheet Row Admission (Current) from 05/22/2023 in [facility]PATIENT BE[facility]AL MEDICINE ED from 05/20/2023 in [facility] Department at [facility] Admission (Discharged) from 04/28/2023 in [facility] INPATIENT BEHAVIORAL MEDICINE  C-SSRS RISK CATEGORY No Risk No Risk No Risk       Last PHQ 2/9 Scores:     No data to display          Scribe for Treatment Team: Aldo CHRISTELLA Dorthy ISRAEL 05/28/2023 3:28 PM

## 2023-05-28 NOTE — Plan of Care (Signed)
 Alert and oriented X4. Bedtime meds administered. Cooperative, denies pain, SI/HI/ and hallucinations. Safety checks performed every 15 mins. Care, comfort and safety maintained/ongoing.   Problem: Education: Goal: Knowledge of [facility] Education information/materials will improve Outcome: Progressing Goal: Emotional status will improve Outcome: Progressing Goal: Mental status will improve Outcome: Progressing Goal: Verbalization of understanding the information provided will improve Outcome: Progressing   Problem: Activity: Goal: Interest or engagement in activities will improve Outcome: Progressing Goal: Sleeping patterns will improve Outcome: Progressing   Problem: Coping: Goal: Ability to verbalize frustrations and anger appropriately will improve Outcome: Progressing Goal: Ability to demonstrate self-control will improve Outcome: Progressing   Problem: Health Behavior/Discharge Planning: Goal: Identification of resources available to assist in meeting health care needs will improve Outcome: Progressing Goal: Compliance with treatment plan for underlying cause of condition will improve Outcome: Progressing   Problem: Physical Regulation: Goal: Ability to maintain clinical measurements within normal limits will improve Outcome: Progressing   Problem: Safety: Goal: Periods of time without injury will increase Outcome: Progressing   Problem: Education: Goal: Utilization of techniques to improve thought processes will improve Outcome: Progressing Goal: Knowledge of the prescribed therapeutic regimen will improve Outcome: Progressing   Problem: Activity: Goal: Interest or engagement in leisure activities will improve Outcome: Progressing Goal: Imbalance in normal sleep/wake cycle will improve Outcome: Progressing   Problem: Coping: Goal: Coping ability will improve Outcome: Progressing Goal: Will verbalize feelings Outcome: Progressing   Problem:  Health Behavior/Discharge Planning: Goal: Ability to make decisions will improve Outcome: Progressing Goal: Compliance with therapeutic regimen will improve Outcome: Progressing   Problem: Role Relationship: Goal: Will demonstrate positive changes in social behaviors and relationships Outcome: Progressing   Problem: Safety: Goal: Ability to disclose and discuss suicidal ideas will improve Outcome: Progressing Goal: Ability to identify and utilize support systems that promote safety will improve Outcome: Progressing   Problem: Self-Concept: Goal: Will verbalize positive feelings about self Outcome: Progressing Goal: Level of anxiety will decrease Outcome: Progressing

## 2023-05-28 NOTE — Group Note (Signed)
 Date:  05/28/2023 Time:  9:51 PM  Group Topic/Focus:  Making Healthy Choices:   The focus of this group is to help patients identify negative/unhealthy choices they were using prior to admission and identify positive/healthier coping strategies to replace them upon discharge.    Participation Level:  Did Not Attend   Isabella [name] 05/28/2023, 9:51 PM

## 2023-05-28 NOTE — Group Note (Unsigned)
 Date:  05/28/2023 Time:  9:06 PM  Group Topic/Focus:  Making Healthy Choices:   The focus of this group is to help patients identify negative/unhealthy choices they were using prior to admission and identify positive/healthier coping strategies to replace them upon discharge.     Participation Level:  {BHH PARTICIPATION OZCZO:77735}  Participation Quality:  {BHH PARTICIPATION QUALITY:22265}  Affect:  {BHH AFFECT:22266}  Cognitive:  {BHH COGNITIVE:22267}  Insight: {BHH Insight2:20797}  Engagement in Group:  {BHH ENGAGEMENT IN HMNLE:77731}  Modes of Intervention:  {BHH MODES OF INTERVENTION:22269}  Additional Comments:  ***  Dwayne Lars 05/28/2023, 9:06 PM
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
## 2023-05-29 MED ORDER — FLUOXETINE HCL 40 MG PO CAPS: 40.0000 mg | ORAL_CAPSULE | Freq: Every day | ORAL | 1 refills | Status: DC

## 2023-05-29 MED ORDER — MELATONIN 5 MG PO TABS: 2.5000 mg | ORAL_TABLET | Freq: Every evening | ORAL | 0 refills | Status: AC | PRN

## 2023-05-29 MED ORDER — LAMOTRIGINE 100 MG PO TABS: 100.0000 mg | ORAL_TABLET | Freq: Every day | ORAL | 0 refills | Status: DC

## 2023-05-29 MED ORDER — NICOTINE 21 MG/24HR TD PT24: 21.0000 mg | MEDICATED_PATCH | Freq: Every day | TRANSDERMAL | 0 refills | Status: AC

## 2023-05-29 MED ORDER — ARIPIPRAZOLE 5 MG PO TABS: 5.0000 mg | ORAL_TABLET | Freq: Every day | ORAL | 0 refills | Status: DC
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
## 2023-05-29 NOTE — Group Note (Signed)
 Recreation Therapy Group Note   Group Topic:Healthy Support Systems  Group Date: 05/29/2023 Start Time: 1010 End Time: 1110 Facilitators: Celestia Jeoffrey BRAVO, LRT, CTRS Location:  Craft Room  Group Description: Straw Bridge. In groups or individually, patients were given 10 plastic drinking straws and an equal length of masking tape. Using the materials provided, patients were instructed to build a free-standing bridge-like structure to suspend an everyday item (ex: deck of cards) off the floor or table surface. All materials were required to be used in secondary school teacher. LRT facilitated post-activity discussion reviewing the importance of having strong and healthy support systems in our lives. LRT discussed how the people in our lives serve as the tape and the deck of cards we placed on top of our straw structure are the stressors we face in daily life. LRT and pts discussed what happens in our life when things get too heavy for us , and we don't have strong supports outside of the hospital. Pt shared 2 of their healthy supports in their life aloud in the group.   Goal Area(s) Addressed:  Patient will identify 2 healthy supports in their life. Patient will identify skills to successfully complete activity. Patient will identify correlation of this activity to life post-discharge.  Patient will build on frustration tolerance skills. Patient will increase team building and communication skills   Affect/Mood: N/A   Participation Level: Did not attend    Clinical Observations/Individualized Feedback: Patient did not attend group.   Plan: Continue to engage patient in RT group sessions 2-3x/week.   Jeoffrey BRAVO Celestia, LRT, CTRS 05/29/2023 12:18 PM

## 2023-05-29 NOTE — Group Note (Signed)
 [facility] LCSW Group Therapy Note    Group Date: 05/29/2023 Start Time: 1330 End Time: 1430  Type of Therapy and Topic:  Group Therapy:  Overcoming Obstacles  Participation Level:  [facility] PARTICIPATION LEVEL: Did Not Attend  Mood:  Description of Group:   In this group patients will be encouraged to explore what they see as obstacles to their own wellness and recovery. They will be guided to discuss their thoughts, feelings, and behaviors related to these obstacles. The group will process together ways to cope with barriers, with attention given to specific choices patients can make. Each patient will be challenged to identify changes they are motivated to make in order to overcome their obstacles. This group will be process-oriented, with patients participating in exploration of their own experiences as well as giving and receiving support and challenge from other group members.  Therapeutic Goals: 1. Patient will identify personal and current obstacles as they relate to admission. 2. Patient will identify barriers that currently interfere with their wellness or overcoming obstacles.  3. Patient will identify feelings, thought process and behaviors related to these barriers. 4. Patient will identify two changes they are willing to make to overcome these obstacles:    Summary of Patient Progress Patient did not attend group.   Therapeutic Modalities:   Cognitive Behavioral Therapy Solution Focused Therapy Motivational Interviewing Relapse Prevention Therapy   Sherryle JINNY Margo, LCSW

## 2023-05-29 NOTE — BHH Counselor (Signed)
 CSW and NO Brad and NP Student [place] spoke with the patient's mother.  Interpreter services were utilized.  [place] [phone].  Mother reports that patient CAN return home.  However, mother reports that she does not think the patient is ready to come home because patient remains in bed and has poor hygiene.  Treatment team informed that patient has been in the milieu with the other patients, up and walking around, talking with staff and addressing her personal hygiene.    Family requested that pt have a referral for home health and ACT team.  Treatment team explained that patient is her own guardian and can decline these services but CSW will follow up.  Mother agreed to patient returning home, she reports that family will pick patient up at [facility].  CSW followed up with patient who declined ACT referral as well as home health.  Sherryle Margo, MSW, LCSW 05/29/2023 10:50 AM

## 2023-05-29 NOTE — Group Note (Signed)
 Date:  05/29/2023 Time:  11:04 AM  Group Topic/Focus:  Dimensions of Wellness:   The focus of this group is to introduce the topic of wellness and discuss the role each dimension of wellness plays in total health. Identifying Needs:   The focus of this group is to help patients identify their personal needs that have been historically problematic and identify healthy behaviors to address their needs.    Participation Level:  Did Not Attend   Isabella [name] 05/29/2023, 11:04 AM

## 2023-05-29 NOTE — Progress Notes (Signed)
  [facility] Adult Case Management Discharge Plan :  Will you be returning to the same living situation after discharge:  Yes,  pt reports that she is returning home At discharge, do you have transportation home?: Yes,  family report that they will provide transportation. Do you have the ability to pay for your medications: Yes,  [place] MEDICAID PREPAID HEALTH PLAN / [place] MEDICAID [facility] COMMUNITY  Release of information consent forms completed and in the chart;  Patient's signature needed at discharge.  Patient to Follow up at:  Follow-up Information     [facility] Follow up.   Why: Walk in hours are from 8AM to 5PM, first come, first serve, please arrive by 7:30AM Contact information: [address] [place] [address] [phone]                 Next level of care provider has access to [facility] Link:no  Safety Planning and Suicide Prevention discussed: Yes,  SPE completed with the patient. Patient declined collateral contact.     Has patient been referred to the Quitline?: Patient does not use tobacco/nicotine  products  Patient has been referred for addiction treatment: No known substance use disorder.  Sherryle JINNY Margo, LCSW 05/29/2023, 10:53 AM

## 2023-05-29 NOTE — Plan of Care (Signed)
  Problem: Education: Goal: Knowledge of [facility] Education information/materials will improve Outcome: Progressing   Problem: Education: Goal: Emotional status will improve Outcome: Progressing   

## 2023-05-29 NOTE — Progress Notes (Signed)
 Patient pleasant and cooperative on approach. Denies SI,HI and AVH. Verbalized understanding discharge instructions,prescriptions and follow up care. All belongings returned from [employer]. Patient refused to fill suicide safety plan.States  I am good. Patient escorted out by staff and transported by family.

## 2023-05-29 NOTE — Progress Notes (Signed)
   05/28/23 2000  Psych Admission Type (Psych Patients Only)  Admission Status Voluntary  Psychosocial Assessment  Patient Complaints None  Eye Contact Brief  Facial Expression Flat  Affect Appropriate to circumstance  Speech Soft  Interaction Assertive  Motor Activity Slow  Appearance/Hygiene Improved  Behavior Characteristics Cooperative;Calm  Mood Pleasant  Aggressive Behavior  Effect No apparent injury  Thought Process  Coherency WDL  Content WDL  Delusions None reported or observed  Perception WDL  Hallucination None reported or observed  Judgment Impaired  Confusion WDL  Danger to Self  Current suicidal ideation? Denies  Agreement Not to Harm Self Yes  Description of Agreement verbal  Danger to Others  Danger to Others None reported or observed   No distress noted she is interacting appropriately with staff and peers , he denies SI/HI/AVH, thought are organized and coherent , mood is congruent with affect no bizarre behavior. 15 minutes safety checks maintained will continue to monitor.

## 2023-05-29 NOTE — BHH Suicide Risk Assessment (Signed)
 [facility] Discharge Suicide Risk Assessment   Principal Problem: Bipolar 1 disorder [facility]) Discharge Diagnoses: Principal Problem:   Bipolar 1 disorder (HCC) Active Problems:   Depressed mood   Total Time spent with patient: 2 hours  Musculoskeletal: Strength & Muscle Tone: within normal limits Gait & Station: normal Patient leans: N/A  Psychiatric Specialty Exam  Presentation  General Appearance:  Appropriate for Environment  Eye Contact: Minimal  Speech: Clear and Coherent; Slow  Speech Volume: Decreased  Handedness: Right   Mood and Affect  Mood: Euthymic  Duration of Depression Symptoms: Greater than two weeks  Affect: Flat   Thought Process  Thought Processes: Goal Directed  Descriptions of Associations:Intact  Orientation:Partial  Thought Content:WDL  History of Schizophrenia/Schizoaffective disorder:No  Duration of Psychotic Symptoms:No data recorded Hallucinations:Hallucinations: None Description of Auditory Hallucinations: denies  Ideas of Reference:None  Suicidal Thoughts:Suicidal Thoughts: No  Homicidal Thoughts:Homicidal Thoughts: No   Sensorium  Memory: Immediate Fair; Remote Fair  Judgment: Fair  Insight: Fair   Art Therapist  Concentration: Fair  Attention Span: Fair  Recall: Fair  Fund of Knowledge: Good  Language: Good   Psychomotor Activity  Psychomotor Activity: Psychomotor Activity: Normal   Assets  Assets: Communication Skills   Sleep  Sleep: Sleep: Good Number of Hours of Sleep: 7   Physical Exam: Physical Exam Vitals and nursing note reviewed.  HENT:     Head: Normocephalic and atraumatic.     Nose: Nose normal.  Pulmonary:     Effort: Pulmonary effort is normal.  Musculoskeletal:        General: Normal range of motion.     Cervical back: Normal range of motion.  Neurological:     General: No focal deficit present.     Mental Status: Mental status is at baseline.   Psychiatric:        Attention and Perception: Attention and perception normal.        Mood and Affect: Affect is blunt and flat.        Speech: Speech normal.        Behavior: Behavior normal. Behavior is cooperative.        Thought Content: Thought content normal.        Cognition and Memory: Cognition and memory normal.        Judgment: Judgment normal.    Review of Systems  All other systems reviewed and are negative.  Blood pressure 132/81, pulse 78, temperature 98 F (36.7 C), resp. rate 20, height 5' 4 (1.626 m), weight 78.2 kg, SpO2 97%. Body mass index is 29.59 kg/m.  Mental Status Per Nursing Assessment::   On Admission:  NA  Demographic Factors:  Divorced or widowed, Low socioeconomic status, Living alone, and Unemployed  Loss Factors: Loss of significant relationship and Financial problems/change in socioeconomic status  Historical Factors: Family history of mental illness or substance abuse  Risk Reduction Factors:   Sense of responsibility to family, Living with another person, especially a relative, and Positive social support  Continued Clinical Symptoms:  Bipolar Disorder:   Depressive phase Depression:   Insomnia Schizophrenia:   Paranoid or undifferentiated type  Cognitive Features That Contribute To Risk:  None    Suicide Risk:  Minimal: No identifiable suicidal ideation.  Patients presenting with no risk factors but with morbid ruminations; may be classified as minimal risk based on the severity of the depressive symptoms   Follow-up Information     [facility] Follow up.   Why: Walk in  hours are from 8AM to 5PM, first come, first serve, please arrive by 7:30AM Contact information: [address] [place] [address] [phone]                 Plan Of Care/Follow-up recommendations:  Activity:  as tolerated Diet:  heart healthy Abilify  (Aripiprazol): 5 mg diarios para manejar los sntomas de la depresin  bipolar y la esquizofrenia. Trazodona: 50 mg por la noche segn sea necesario para el insomnio. Prozac  (Fluoxetina): 40 mg diarios para el tratamiento de la depresin y estabilizacin del estado de nimo en el Trastorno Bipolar. Melatonina: 2.5 mg por la noche para regular el sueo y gap inc. Lamictal  (Lamotrigina): 100 mg diarios para la estabilizacin del estado de nimo y manejo del Trastorno Bipolar. [facility]: Reconctese con este proveedor u otro proveedor ambulatorio para personal assistant. Abilify  (Aripiprazole ): 5 mg daily to manage symptoms of bipolar depression and schizophrenia. Trazodone : 50 mg nightly PRN for insomnia Prozac  (Fluoxetine ): 40 mg daily for treatment of depression and stabilization of mood in Bipolar Disorder Melatonin: 2.5 mg nightly for sleep regulation and management of insomnia Lamictal  (Lamotrigine ): 100 mg daily for mood stabilization and management of Bipolar Disorder [facility]: Reconnect with this or another outpatient provider for ongoing care. Brad GORMAN Moats, NP 05/29/2023, 12:37 PM

## 2023-06-22 ENCOUNTER — Other Ambulatory Visit: Payer: Self-pay | Admitting: Family Medicine

## 2023-06-22 DIAGNOSIS — Z1231 Encounter for screening mammogram for malignant neoplasm of breast: Secondary | ICD-10-CM

## 2023-06-28 ENCOUNTER — Inpatient Hospital Stay: Admission: RE | Admit: 2023-06-28 | Payer: Medicaid Other | Source: Ambulatory Visit

## 2023-07-16 ENCOUNTER — Other Ambulatory Visit: Payer: Self-pay

## 2023-07-16 ENCOUNTER — Emergency Department
Admission: EM | Admit: 2023-07-16 | Discharge: 2023-07-17 | Disposition: A | Discharge status: Other Acute Inpatient Hospital | Attending: Emergency Medicine | Admitting: Emergency Medicine

## 2023-07-16 DIAGNOSIS — F202 Catatonic schizophrenia: Secondary | ICD-10-CM | POA: Insufficient documentation

## 2023-07-16 DIAGNOSIS — I10 Essential (primary) hypertension: Secondary | ICD-10-CM | POA: Insufficient documentation

## 2023-07-16 DIAGNOSIS — F314 Bipolar disorder, current episode depressed, severe, without psychotic features: Secondary | ICD-10-CM | POA: Diagnosis not present

## 2023-07-16 DIAGNOSIS — F3130 Bipolar disorder, current episode depressed, mild or moderate severity, unspecified: Secondary | ICD-10-CM | POA: Insufficient documentation

## 2023-07-16 LAB — COMPREHENSIVE METABOLIC PANEL
ALT: 19 U/L (ref 0–44)
AST: 22 U/L (ref 15–41)
Albumin: 4.6 g/dL (ref 3.5–5.0)
Alkaline Phosphatase: 64 U/L (ref 38–126)
Anion gap: 14 (ref 5–15)
BUN: 26 mg/dL — ABNORMAL HIGH (ref 6–20)
CO2: 21 mmol/L — ABNORMAL LOW (ref 22–32)
Calcium: 9.9 mg/dL (ref 8.9–10.3)
Chloride: 105 mmol/L (ref 98–111)
Creatinine, Ser: 0.7 mg/dL (ref 0.44–1.00)
GFR, Estimated: >60 mL/min (ref >60)
Glucose, Bld: 101 mg/dL — ABNORMAL HIGH (ref 70–99)
Potassium: 4.1 mmol/L (ref 3.5–5.1)
Sodium: 140 mmol/L (ref 135–145)
Total Bilirubin: 1 mg/dL (ref 0.0–1.2)
Total Protein: 8.7 g/dL — ABNORMAL HIGH (ref 6.5–8.1)

## 2023-07-16 LAB — CBC
HCT: 48.4 % — ABNORMAL HIGH (ref 36.0–46.0)
Hemoglobin: 16.4 g/dL — ABNORMAL HIGH (ref 12.0–15.0)
MCH: 32.4 pg (ref 26.0–34.0)
MCHC: 33.9 g/dL (ref 30.0–36.0)
MCV: 95.7 fL (ref 80.0–100.0)
Platelets: 456 10*3/uL — ABNORMAL HIGH (ref 150–400)
RBC: 5.06 MIL/uL (ref 3.87–5.11)
RDW: 12.5 % (ref 11.5–15.5)
WBC: 8.3 10*3/uL (ref 4.0–10.5)
nRBC: 0 % (ref 0.0–0.2)

## 2023-07-16 LAB — URINE DRUG SCREEN, QUALITATIVE (ARMC ONLY)
Amphetamines, Ur Screen: NOT DETECTED
Barbiturates, Ur Screen: NOT DETECTED
Benzodiazepine, Ur Scrn: NOT DETECTED
Cannabinoid 50 Ng, Ur ~~LOC~~: NOT DETECTED
Cocaine Metabolite,Ur ~~LOC~~: NOT DETECTED
MDMA (Ecstasy)Ur Screen: NOT DETECTED
Methadone Scn, Ur: NOT DETECTED
Opiate, Ur Screen: NOT DETECTED
Phencyclidine (PCP) Ur S: NOT DETECTED
Tricyclic, Ur Screen: NOT DETECTED

## 2023-07-16 LAB — SALICYLATE LEVEL: Salicylate Lvl: <7 mg/dL — ABNORMAL LOW (ref 7.0–30.0)

## 2023-07-16 LAB — ACETAMINOPHEN LEVEL: Acetaminophen (Tylenol), Serum: <10 ug/mL — ABNORMAL LOW (ref 10–30)

## 2023-07-16 LAB — ETHANOL: Alcohol, Ethyl (B): <10 mg/dL (ref <10)

## 2023-07-16 MED ORDER — FLUOXETINE HCL 20 MG PO CAPS
40.0000 mg | ORAL_CAPSULE | Freq: Every day | ORAL | Status: DC
  Administered 2023-07-16: 40 mg via ORAL
  Filled 2023-07-16: qty 2

## 2023-07-16 MED ORDER — ARIPIPRAZOLE 10 MG PO TABS
5.0000 mg | ORAL_TABLET | Freq: Every day | ORAL | Status: DC
  Administered 2023-07-16: 5 mg via ORAL
  Filled 2023-07-16: qty 1

## 2023-07-16 MED ORDER — LAMOTRIGINE 100 MG PO TABS
100.0000 mg | ORAL_TABLET | Freq: Every day | ORAL | Status: DC
Start: 2023-07-16 — End: 2023-07-17
  Administered 2023-07-16: 100 mg via ORAL
  Filled 2023-07-16: qty 1

## 2023-07-16 NOTE — ED Provider Notes (Signed)
 [facility] Provider Note    Event Date/Time   First MD Initiated Contact with Patient 07/16/23 1656     (approximate)   History   Chief Complaint Mental Health Problem   HPI  Isabella [name] is a 48 y.o. female with past medical history of hypertension, schizophrenia, and bipolar disorder who presents to the ED for psychiatric evaluation.  Majority of history is obtained from patient sister-in-law, who states that she has been increasingly depressed and hypoactive for the past week.  Family states that patient will spend multiple days in bed and not want to eat or drink anything, only gets up to go to the bathroom.  Patient reports that she has been increasingly depressed but she denies any suicidal or homicidal ideation, also denies any hallucinations.  Family reports they think she has not been taking her medications, patient denies any alcohol or drug use.  She reportedly began walking on the highway today, stating that she was on her way to the hospital but was headed in the wrong direction.     Physical Exam   Triage Vital Signs: ED Triage Vitals  Encounter Vitals Group     BP 07/16/23 1628 (!) 146/124     Systolic BP Percentile --      Diastolic BP Percentile --      Pulse Rate 07/16/23 1628 93     Resp 07/16/23 1628 18     Temp 07/16/23 1628 99 F (37.2 C)     Temp Source 07/16/23 1628 Oral     SpO2 07/16/23 1628 96 %     Weight 07/16/23 1629 150 lb (68 kg)     Height 07/16/23 1629 5\' 3"  (1.6 m)     Head Circumference --      Peak Flow --      Pain Score 07/16/23 1629 7     Pain Loc --      Pain Education --      Exclude from Growth Chart --     Most recent vital signs: Vitals:   07/16/23 1628  BP: (!) 146/124  Pulse: 93  Resp: 18  Temp: 99 F (37.2 C)  SpO2: 96%    Constitutional: Alert and oriented. Eyes: Conjunctivae are normal. Head: Atraumatic. Nose: No congestion/rhinnorhea. Mouth/Throat: Mucous membranes are  moist.  Cardiovascular: Normal rate, regular rhythm. Grossly normal heart sounds.  2+ radial pulses bilaterally. Respiratory: Normal respiratory effort.  No retractions. Lungs CTAB. Gastrointestinal: Soft and nontender. No distention. Musculoskeletal: No lower extremity tenderness nor edema.  Neurologic:  Normal speech and language. No gross focal neurologic deficits are appreciated.    ED Results / Procedures / Treatments   Labs (all labs ordered are listed, but only abnormal results are displayed) Labs Reviewed  COMPREHENSIVE METABOLIC PANEL - Abnormal; Notable for the following components:      Result Value   CO2 21 (*)    Glucose, Bld 101 (*)    BUN 26 (*)    Total Protein 8.7 (*)    All other components within normal limits  SALICYLATE LEVEL - Abnormal; Notable for the following components:   Salicylate Lvl <7.0 (*)    All other components within normal limits  ACETAMINOPHEN LEVEL - Abnormal; Notable for the following components:   Acetaminophen (Tylenol), Serum <10 (*)    All other components within normal limits  CBC - Abnormal; Notable for the following components:   Hemoglobin 16.4 (*)    HCT 48.4 (*)  Platelets 456 (*)    All other components within normal limits  ETHANOL  URINE DRUG SCREEN, QUALITATIVE ([facility] ONLY)  POC URINE PREG, ED    PROCEDURES:  Critical Care performed: No  Procedures   MEDICATIONS ORDERED IN ED: Medications  ARIPiprazole (ABILIFY) tablet 5 mg (has no administration in time range)  FLUoxetine (PROZAC) capsule 40 mg (has no administration in time range)  lamoTRIgine (LAMICTAL) tablet 100 mg (has no administration in time range)     IMPRESSION / MDM / ASSESSMENT AND PLAN / ED COURSE  I reviewed the triage vital signs and the nursing notes.                              48 y.o. female with past medical history of hypertension, catatonic schizophrenia, and bipolar disorder who presents to the ED for psychiatric evaluation due to  increasing depression and hypoactivity.  Patient's presentation is most consistent with acute presentation with potential threat to life or bodily function.  Differential diagnosis includes, but is not limited to, psychosis, catatonia, medication noncompliance, anemia, lecture abnormality, AKI, depression, anxiety.  Patient nontoxic-appearing and in no acute distress, vital signs are unremarkable.  She denies any medical complaints and screening labs are unremarkable with no significant anemia, leukocytosis, electrolyte abnormality, or AKI.  LFTs are also unremarkable, Tylenol and salicylate levels are undetectable.  Patient may be medically cleared for psychiatric disposition, psych eval is pending at this time.  We will maintain voluntary status as she is calm and cooperative, agreeable to admission.  The patient has been placed in psychiatric observation due to the need to provide a safe environment for the patient while obtaining psychiatric consultation and evaluation, as well as ongoing medical and medication management to treat the patient's condition.  The patient has not been placed under full IVC at this time.      FINAL CLINICAL IMPRESSION(S) / ED DIAGNOSES   Final diagnoses:  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HCC)     Rx / DC Orders   ED Discharge Orders     None        Note:  This document was prepared using Dragon voice recognition software and may include unintentional dictation errors.   [name], MD 07/16/23 [phone]

## 2023-07-16 NOTE — ED Triage Notes (Addendum)
 Pt arrives with family member. Family sts that pt sts that she was walking to the hospital and started walking on a busy road but in the opposite direction. Pt has a hx of schizophrenia. Pt has no facial expressions and is looking at staff like she is staring at them even though pt endorses that she is taking her medication. Family sts that pt has done this many times before and they need help as pt is taking care of other family members and is afraid that they could get injured.

## 2023-07-16 NOTE — ED Notes (Signed)
TTS in speaking with patient. 
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
## 2023-07-16 NOTE — BH Assessment (Signed)
 Comprehensive Clinical Assessment (CCA) Screening, Triage and Referral Note  07/16/2023 Mammie Meras [id number]  Chief Complaint:  Patient is a 48 y.o. female with past medical history of hypertension, schizophrenia, and bipolar disorder who presents to the ED for psychiatric evaluation.  Majority of history is obtained from patient sister-in-law, who states that she has been increasingly depressed and hypoactive for the past week. Patient reports that she has been increasingly depressed but she denies any suicidal or homicidal ideation, also denies any hallucinations.  Family reports they think she has not been taking her medications, patient denies any alcohol or drug use.  She reportedly began walking on the highway today, stating that she was on her way to the hospital but was headed in the wrong direction. Patient states she has been taking care of her ill mother.    Visit Diagnosis: Schizophrenia  Patient Reported Information How did you hear about [place]? Family/Friend  What Is the Reason for Your Visit/Call Today? Sadness  How Long Has This Been Causing You Problems? 1-6 months  What Do You Feel Would Help You the Most Today? Medication(s)   Have You Recently Had Any Thoughts About Hurting Yourself? No  Are You Planning to Commit Suicide/Harm Yourself At This time? No   Have you Recently Had Thoughts About Hurting Someone [name]? No  Are You Planning to Harm Someone at This Time? No  Explanation: n/a   Have You Used Any Alcohol or Drugs in the Past 24 Hours? No  How Long Ago Did You Use Drugs or Alcohol? No data recorded What Did You Use and How Much? No data recorded  Do You Currently Have a Therapist/Psychiatrist? No  Name of Therapist/Psychiatrist: None   Have You Been Recently Discharged From Any Office Practice or Programs? No  Explanation of Discharge From Practice/Program: Pt was released from jail 2 days ago    CCA Screening Triage Referral  Assessment Type of Contact: Face-to-Face  Telemedicine Service Delivery:   Is this Initial or Reassessment?   Date Telepsych consult ordered in CHL:    Time Telepsych consult ordered in CHL:    Location of Assessment: [facility] ED  Provider Location: [facility]    Collateral Involvement: n/a   Does Patient Have a Court Appointed Legal Guardian? No data recorded Name and Contact of Legal Guardian: No data recorded If Minor and Not Living with Parent(s), Who has Custody? n/a  Is CPS involved or ever been involved? Never  Is APS involved or ever been involved? Never   Patient Determined To Be At Risk for Harm To Self or Others Based on Review of Patient Reported Information or Presenting Complaint? No  Method: No Plan  Availability of Means: No access or NA  Intent: Vague intent or NA  Notification Required: No need or identified person  Additional Information for Danger to Others Potential: Active psychosis  Additional Comments for Danger to Others Potential: n/a  Are There Guns or Other Weapons in Your Home? No  Types of Guns/Weapons: n/a  Are These Weapons Safely Secured?                            No  Who Could Verify You Are Able To Have These Secured: n/a  Do You Have any Outstanding Charges, Pending Court Dates, Parole/Probation? N/A  Contacted To Inform of Risk of Harm To Self or Others: Other: Comment   Does Patient Present under Involuntary Commitment? No  [place] of Residence: [place]   Patient Currently Receiving the Following Services: Medication Management   Determination of Need: Emergent (2 hours)   Options For Referral: Inpatient Hospitalization; Medication Management   Disposition Recommendation per psychiatric provider: We recommend inpatient psychiatric hospitalization when medically cleared. Patient is under voluntary admission status at this time; please IVC if attempts to leave hospital.  [place], Counselor

## 2023-07-16 NOTE — ED Notes (Signed)
Pt refused snack at this time.  
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
## 2023-07-16 NOTE — ED Notes (Signed)
VOL, pend psych consult 

## 2023-07-16 NOTE — ED Notes (Signed)
 Pt changed into psych safe clothing.   Pt belongings include Black and pink sandals Blue jeans White shirt Pink hair tie Tan underwear Tan Bra

## 2023-07-17 ENCOUNTER — Encounter: Payer: Self-pay | Admitting: Psychiatric/Mental Health

## 2023-07-17 ENCOUNTER — Inpatient Hospital Stay
Admission: AD | Admit: 2023-07-17 | Discharge: 2023-07-22 | DRG: 885 | Disposition: A | Discharge status: Home / Self Care | Source: Intra-hospital | Attending: Psychiatry | Admitting: Psychiatry

## 2023-07-17 DIAGNOSIS — F202 Catatonic schizophrenia: Secondary | ICD-10-CM | POA: Diagnosis present

## 2023-07-17 DIAGNOSIS — F419 Anxiety disorder, unspecified: Secondary | ICD-10-CM | POA: Diagnosis present

## 2023-07-17 DIAGNOSIS — Z7984 Long term (current) use of oral hypoglycemic drugs: Secondary | ICD-10-CM

## 2023-07-17 DIAGNOSIS — Z79899 Other long term (current) drug therapy: Secondary | ICD-10-CM | POA: Diagnosis not present

## 2023-07-17 DIAGNOSIS — Z833 Family history of diabetes mellitus: Secondary | ICD-10-CM | POA: Diagnosis not present

## 2023-07-17 DIAGNOSIS — F17210 Nicotine dependence, cigarettes, uncomplicated: Secondary | ICD-10-CM | POA: Diagnosis present

## 2023-07-17 DIAGNOSIS — Z604 Social exclusion and rejection: Secondary | ICD-10-CM | POA: Diagnosis present

## 2023-07-17 DIAGNOSIS — G4700 Insomnia, unspecified: Secondary | ICD-10-CM | POA: Diagnosis present

## 2023-07-17 DIAGNOSIS — F314 Bipolar disorder, current episode depressed, severe, without psychotic features: Secondary | ICD-10-CM | POA: Diagnosis present

## 2023-07-17 DIAGNOSIS — I10 Essential (primary) hypertension: Secondary | ICD-10-CM | POA: Diagnosis present

## 2023-07-17 DIAGNOSIS — F940 Selective mutism: Secondary | ICD-10-CM | POA: Diagnosis present

## 2023-07-17 DIAGNOSIS — R630 Anorexia: Secondary | ICD-10-CM | POA: Diagnosis present

## 2023-07-17 LAB — GLUCOSE, CAPILLARY: Glucose-Capillary: 105 mg/dL — ABNORMAL HIGH (ref 70–99)

## 2023-07-17 LAB — PREGNANCY, URINE: Preg Test, Ur: NEGATIVE

## 2023-07-17 MED ORDER — DIPHENHYDRAMINE HCL 25 MG PO CAPS: 50.0000 mg | ORAL_CAPSULE | Freq: Three times a day (TID) | ORAL | Status: DC | PRN

## 2023-07-17 MED ORDER — PALIPERIDONE ER 3 MG PO TB24
3.0000 mg | ORAL_TABLET | Freq: Every day | ORAL | Status: DC
  Administered 2023-07-17 – 2023-07-22 (×6): 3 mg via ORAL
  Filled 2023-07-17 (×6): qty 1

## 2023-07-17 MED ORDER — MELATONIN 5 MG PO TABS
5.0000 mg | ORAL_TABLET | Freq: Every day | ORAL | Status: DC
  Administered 2023-07-17 – 2023-07-21 (×4): 5 mg via ORAL
  Filled 2023-07-17 (×4): qty 1

## 2023-07-17 MED ORDER — ACETAMINOPHEN 325 MG PO TABS: 650.0000 mg | ORAL_TABLET | Freq: Four times a day (QID) | ORAL | Status: DC | PRN

## 2023-07-17 MED ORDER — TRAZODONE HCL 50 MG PO TABS
50.0000 mg | ORAL_TABLET | Freq: Every evening | ORAL | Status: DC | PRN
  Administered 2023-07-19: 50 mg via ORAL
  Filled 2023-07-17: qty 1

## 2023-07-17 MED ORDER — GLUCERNA SHAKE PO LIQD
237.0000 mL | Freq: Two times a day (BID) | ORAL | Status: DC
  Administered 2023-07-19 – 2023-07-21 (×6): 237 mL via ORAL

## 2023-07-17 MED ORDER — FLUOXETINE HCL 20 MG PO CAPS
60.0000 mg | ORAL_CAPSULE | Freq: Every day | ORAL | Status: DC
  Administered 2023-07-18 – 2023-07-22 (×5): 60 mg via ORAL
  Filled 2023-07-17 (×5): qty 3

## 2023-07-17 MED ORDER — METFORMIN HCL 500 MG PO TABS
500.0000 mg | ORAL_TABLET | Freq: Two times a day (BID) | ORAL | Status: DC
  Administered 2023-07-17 – 2023-07-22 (×10): 500 mg via ORAL
  Filled 2023-07-17 (×10): qty 1

## 2023-07-17 MED ORDER — DIPHENHYDRAMINE HCL 50 MG/ML IJ SOLN: 50.0000 mg | Freq: Three times a day (TID) | INTRAMUSCULAR | Status: DC | PRN

## 2023-07-17 MED ORDER — LORAZEPAM 2 MG/ML IJ SOLN: 2.0000 mg | Freq: Three times a day (TID) | INTRAMUSCULAR | Status: DC | PRN

## 2023-07-17 MED ORDER — FLUOXETINE HCL 20 MG PO CAPS
40.0000 mg | ORAL_CAPSULE | Freq: Every day | ORAL | Status: DC
  Administered 2023-07-17: 40 mg via ORAL
  Filled 2023-07-17: qty 2

## 2023-07-17 MED ORDER — HALOPERIDOL LACTATE 5 MG/ML IJ SOLN: 5.0000 mg | Freq: Three times a day (TID) | INTRAMUSCULAR | Status: DC | PRN

## 2023-07-17 MED ORDER — HALOPERIDOL 5 MG PO TABS: 5.0000 mg | ORAL_TABLET | Freq: Three times a day (TID) | ORAL | Status: DC | PRN

## 2023-07-17 MED ORDER — HYDROXYZINE HCL 25 MG PO TABS: 25.0000 mg | ORAL_TABLET | Freq: Three times a day (TID) | ORAL | Status: DC | PRN

## 2023-07-17 MED ORDER — HALOPERIDOL LACTATE 5 MG/ML IJ SOLN: 10.0000 mg | Freq: Three times a day (TID) | INTRAMUSCULAR | Status: DC | PRN

## 2023-07-17 MED ORDER — LAMOTRIGINE 100 MG PO TABS
100.0000 mg | ORAL_TABLET | Freq: Every day | ORAL | Status: DC
  Administered 2023-07-17: 100 mg via ORAL
  Filled 2023-07-17: qty 1

## 2023-07-17 MED ORDER — LORAZEPAM 0.5 MG PO TABS: 0.5000 mg | ORAL_TABLET | Freq: Two times a day (BID) | ORAL | Status: DC

## 2023-07-17 MED ORDER — MAGNESIUM HYDROXIDE 400 MG/5ML PO SUSP: 30.0000 mL | Freq: Every day | ORAL | Status: DC | PRN

## 2023-07-17 MED ORDER — LORAZEPAM 1 MG PO TABS
1.0000 mg | ORAL_TABLET | Freq: Two times a day (BID) | ORAL | Status: AC
  Administered 2023-07-17 – 2023-07-18 (×3): 1 mg via ORAL
  Filled 2023-07-17 (×4): qty 1

## 2023-07-17 MED ORDER — LITHIUM CARBONATE ER 300 MG PO TBCR
300.0000 mg | EXTENDED_RELEASE_TABLET | Freq: Two times a day (BID) | ORAL | Status: DC
  Administered 2023-07-17 – 2023-07-22 (×9): 300 mg via ORAL
  Filled 2023-07-17 (×9): qty 1

## 2023-07-17 MED ORDER — ALUM & MAG HYDROXIDE-SIMETH 200-200-20 MG/5ML PO SUSP: 30.0000 mL | ORAL | Status: DC | PRN

## 2023-07-17 MED ORDER — ARIPIPRAZOLE 5 MG PO TABS
5.0000 mg | ORAL_TABLET | Freq: Every day | ORAL | Status: DC
  Administered 2023-07-17: 5 mg via ORAL
  Filled 2023-07-17: qty 1

## 2023-07-17 NOTE — Progress Notes (Signed)
 Pt admitted from [facility] - ED, report received from [place], [place]. Pt presents as responding to internal stimuli and endorses A/H but did state that they were not commanding. Pt oriented to the unit and her room. Pt given education, support, and encouragement to be active in her treatment plan. Pt skin assessment completed with [place], RN, no abnormalities or contraband found. Pt being monitored Q 15 minutes for safety per unit protocol, remains safe on the unit

## 2023-07-17 NOTE — Progress Notes (Signed)
   07/17/23 1103  Psych Admission Type (Psych Patients Only)  Admission Status Voluntary  Psychosocial Assessment  Patient Complaints Depression  Eye Contact Brief  Facial Expression Flat  Affect Appropriate to circumstance  Speech Soft  Interaction Minimal  Motor Activity Slow  Appearance/Hygiene In scrubs  Behavior Characteristics Cooperative  Mood Pleasant  Thought Process  Coherency Disorganized  Content WDL  Delusions None reported or observed  Perception Hallucinations  Hallucination Auditory  Judgment Impaired  Confusion Mild  Danger to Self  Current suicidal ideation? Denies  Agreement Not to Harm Self Yes  Description of Agreement verbal  Danger to Others  Danger to Others None reported or observed

## 2023-07-17 NOTE — Group Note (Signed)
 Date:  07/17/2023 Time:  11:38 AM  Group Topic/Focus:  Goals Group:   The focus of this group is to help patients establish daily goals to achieve during treatment and discuss how the patient can incorporate goal setting into their daily lives to aide in recovery.  Participation Level:  Did Not Attend  Cheryel Kyte A Adriane Guglielmo 07/17/2023, 11:38 AM

## 2023-07-17 NOTE — Tx Team (Signed)
 Initial Treatment Plan 07/17/2023 3:02 AM [name] [facility] LKG:[id number]    PATIENT STRESSORS: Marital or family conflict   Medication change or noncompliance     PATIENT STRENGTHS: Motivation for treatment/growth  Supportive family/friends    PATIENT IDENTIFIED PROBLEMS: Depression  Anxiety  Auditory Hallucinations                  DISCHARGE CRITERIA:  Improved stabilization in mood, thinking, and/or behavior Verbal commitment to aftercare and medication compliance  PRELIMINARY DISCHARGE PLAN: Outpatient therapy Return to previous living arrangement  PATIENT/FAMILY INVOLVEMENT: This treatment plan has been presented to and reviewed with the patient, Isabella [name]. The patient has been given the opportunity to ask questions and make suggestions.  Elmyra Ricks, RN 07/17/2023, 3:02 AM

## 2023-07-17 NOTE — Group Note (Signed)
 Recreation Therapy Group Note   Group Topic:Goal Setting  Group Date: 07/17/2023 Start Time: 1040 End Time: 1140 Facilitators: Rosina Lowenstein, LRT, CTRS Location:  Craft Room  Group Description: Product/process development scientist. Patients were given many different magazines, a glue stick, markers, and a piece of cardstock paper. LRT and pts discussed the importance of having goals in life. LRT and pts discussed the difference between short-term and long-term goals, as well as what a SMART goal is. LRT encouraged pts to create a vision board, with images they picked and then cut out with safety scissors from the magazine, for themselves, that capture their short and long-term goals. LRT encouraged pts to show and explain their vision board to the group.   Goal Area(s) Addressed:  Patient will gain knowledge of short vs. long term goals.  Patient will identify goals for themselves. Patient will practice setting SMART goals. Patient will verbalize their goals to LRT and peers.   Affect/Mood: N/A   Participation Level: Did not attend    Clinical Observations/Individualized Feedback: Patient did not attend group.   Plan: Continue to engage patient in RT group sessions 2-3x/week.   [address], LRT, CTRS 07/17/2023 1:25 PM

## 2023-07-17 NOTE — ED Notes (Signed)
 Vol/consult done/NP recommended Admission to inpatient psychiatric unit for stabilization.

## 2023-07-17 NOTE — BHH Counselor (Signed)
 CSW attempted to meet with pt for completion of the PSA. Pt was laying in bed and stated that she was not feeling well. CSW attempted to complete assessment but pt was selective mute and would only give yes or no answers. CSW shared that they could attempt the assessment at a later date. No other concerns expressed. Contact ended without incident.   Vilma Meckel. Algis Greenhouse, MSW, LCSW, LCAS 07/17/2023 4:21 PM

## 2023-07-17 NOTE — BHH Suicide Risk Assessment (Addendum)
 [facility] Admission Suicide Risk Assessment   Nursing information obtained from:  Patient Demographic factors:  NA Current Mental Status:  NA Loss Factors:  NA Historical Factors:  NA Risk Reduction Factors:  NA  Total Time spent with patient: 2 hours Principal Problem: Severe bipolar I disorder, current or most recent episode depressed (HCC) Diagnosis:  Principal Problem:   Severe bipolar I disorder, current or most recent episode depressed (HCC) Active Problems:   Decreased appetite   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HCC)   Selective mutism  Subjective Data:48 year old Hispanic Female resented to the psychiatric emergency room accompanied by a family member after being found walking in the wrong direction on a busy road. The family reports that she has engaged in this behavior multiple times before and expresses concern about her safety and her ability to care for other family members. They fear that her disorganized behavior and impaired judgment may lead to harm.The patient states she was attempting to walk to the hospital but was brought in by someone who saw her on the road. She reports feeling "sad" and states that she has not slept for the past two days. She endorses auditory hallucinations but denies visual hallucinations. The patient expresses paranoid thoughts, believing that people are watching her, but denies any suicidal or homicidal ideation.Despite reporting adherence to prescribed medications, she presents with a flat affect, minimal engagement with staff, and staring behavior. She admits to poor self-care, including not showering and staying in bed most of the time.The family confirms a history of schizophrenia, stating that she has displayed increasingly disorganized behaviors over time. They report that she was walking in an unsafe area in the wrong direction, which has happened before. They are increasingly worried about her functional decline, paranoia, and worsening psychiatric symptoms,  which have impacted her ability to care for herself and others.   Continued Clinical Symptoms:  Alcohol Use Disorder Identification Test Final Score (AUDIT): 0 The "Alcohol Use Disorders Identification Test", Guidelines for Use in Primary Care, Second Edition.  World Science writer [facility]). Score between 0-7:  no or low risk or alcohol related problems. Score between 8-15:  moderate risk of alcohol related problems. Score between 16-19:  high risk of alcohol related problems. Score 20 or above:  warrants further diagnostic evaluation for alcohol dependence and treatment.   CLINICAL FACTORS:   Bipolar Disorder:   Depressive phase Schizophrenia:   Depressive state Paranoid or undifferentiated type More than one psychiatric diagnosis Currently Psychotic   Musculoskeletal: Strength & Muscle Tone: within normal limits Gait & Station: normal Patient leans: N/A  Psychiatric Specialty Exam:  Presentation  General Appearance:  Fairly Groomed (mild body odor)  Eye Contact: Minimal (Staring at staff, minimally engaged, slow psychomotor activity, possible catatonic features.)  Speech: Garbled; Slow (Delayed responses, low volume, and flat intonation)  Speech Volume: Decreased  Handedness: Right   Mood and Affect  Mood: Depressed  Affect: Flat (emotionally blunted.)   Thought Process  Thought Processes: Disorganized  Descriptions of Associations:Tangential  Orientation:Partial  Thought Content:Paranoid Ideation  History of Schizophrenia/Schizoaffective disorder:Yes  Duration of Psychotic Symptoms:Greater than six months  Hallucinations:Hallucinations: Other (comment) Description of Auditory Hallucinations: (believes she is being watched).  Ideas of Reference:Paranoia  Suicidal Thoughts:Suicidal Thoughts: No  Homicidal Thoughts:Homicidal Thoughts: No   Sensorium  Memory: Immediate Poor; Remote Poor; Recent Poor  Judgment: Poor (engages in unsafe  behaviors.)  Insight: Poor (does not recognize severity of symptoms.)   Executive Functions  Concentration: Poor  Attention Span: Poor  Recall: Poor  Fund of Knowledge: Poor  Language: Poor   Psychomotor Activity  Psychomotor Activity:Psychomotor Activity: Normal   Assets  Assets: Social Support   Sleep  Sleep:Sleep: Poor Number of Hours of Sleep: 2    Physical Exam: Physical Exam Vitals and nursing note reviewed.  Constitutional:      Appearance: Normal appearance.  HENT:     Head: Normocephalic and atraumatic.     Nose: Nose normal.  Pulmonary:     Effort: Pulmonary effort is normal.  Musculoskeletal:        General: Normal range of motion.     Cervical back: Normal range of motion.  Neurological:     General: No focal deficit present.     Mental Status: She is alert. Mental status is at baseline.  Psychiatric:        Attention and Perception: She is inattentive. She perceives auditory hallucinations.        Mood and Affect: Affect is blunt, flat and inappropriate.        Speech: Speech is delayed.        Behavior: Behavior is slowed and withdrawn. Behavior is cooperative.        Thought Content: Thought content is paranoid.        Cognition and Memory: Cognition is impaired. She exhibits impaired recent memory.        Judgment: Judgment is inappropriate.    Review of Systems  Psychiatric/Behavioral:  Positive for depression. The patient has insomnia.   All other systems reviewed and are negative.  Blood pressure 120/70, pulse 74, temperature 97.6 F (36.4 C), temperature source Oral, resp. rate 19, height 5\' 3"  (1.6 m), weight 68 kg, SpO2 97%. Body mass index is 26.57 kg/m.   COGNITIVE FEATURES THAT CONTRIBUTE TO RISK:  Thought constriction (tunnel vision)    SUICIDE RISK:   Mild:  Suicidal ideation of limited frequency, intensity, duration, and specificity.  There are no identifiable plans, no associated intent, mild dysphoria and  related symptoms, good self-control (both objective and subjective assessment), few other risk factors, and identifiable protective factors, including available and accessible social support.  PLAN OF CARE:  Monitor every 15 minutes to ensure patient safety and prevent risk of self-harm or further decompensation. Lorazepam (Ativan) 1-2 mg PO/IM BID (First-line treatment for catatonia; monitor response). Initiate oral Paliperidone ER (Invega ER) 3 mg PO daily for the next 7 days to maintain stable plasma levels. Increase to Paliperidone ER 6 mg PO daily for an additional 7 days if needed. Gradually reduce Paliperidone ER by 3 mg weekly based on clinical respon Administer the last dose of Invega Sustenna 156 mg IM on Day 1. Gradual reduction prevents rebound psychosis and withdrawal dyskinesia. Overlap with the new antipsychotic ensures continued symptom control. Monitor for malignant catatonia (fever, autonomic instability, rigidity). Start Melatonin 5 mg HS for sleep support. Increase Prozac (Fluoxetine) to 40 mg daily for depressive symptoms and anxiety. Discontinue Lamotrigine (Lamictal) due to lack of efficacy or need for medication reassessment. Lithium 300 mg BID (Monitor serum levels to ensure therapeutic range) Discontinue Aripiprazole (Abilify) due to insufficient response or need for medication reassessment. I certify that inpatient services furnished can reasonably be expected to improve the patient's condition.   Myriam Forehand, NP 07/17/2023, 4:47 PM

## 2023-07-17 NOTE — Group Note (Signed)
 Date:  07/17/2023 Time:  9:00 PM  Group Topic/Focus:  Wrap-Up Group:   The focus of this group is to help patients review their daily goal of treatment and discuss progress on daily workbooks.    Participation Level:  Did Not Attend  Participation Quality:   none  Affect:   none  Cognitive:   none  Insight: None  Engagement in Group:   none  Modes of Intervention:   none  Additional Comments:  none   Belva Crome 07/17/2023, 9:00 PM

## 2023-07-17 NOTE — Group Note (Signed)
 LCSW Group Therapy Note   Group Date: 07/17/2023 Start Time: 1300 End Time: 1418   Type of Therapy and Topic:  Group Therapy: Challenging Core Beliefs  Participation Level:  Did Not Attend  Description of Group:  Patients were educated about core beliefs and asked to identify one harmful core belief that they have. Patients were asked to explore from where those beliefs originate. Patients were asked to discuss how those beliefs make them feel and the resulting behaviors of those beliefs. They were then be asked if those beliefs are true and, if so, what evidence they have to support them. Lastly, group members were challenged to replace those negative core beliefs with helpful beliefs.   Therapeutic Goals:   1. Patient will identify harmful core beliefs and explore the origins of such beliefs. 2. Patient will identify feelings and behaviors that result from those core beliefs. 3. Patient will discuss whether such beliefs are true. 4.  Patient will replace harmful core beliefs with helpful ones.  Summary of Patient Progress:  Patient did not attend.   Therapeutic Modalities: Cognitive Behavioral Therapy; Solution-Focused Therapy   [name], LCSWA 07/17/2023  2:21 PM
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
## 2023-07-17 NOTE — BH IP Treatment Plan (Signed)
 Interdisciplinary Treatment and Diagnostic Plan Update  07/17/2023 Time of Session: 10:20 AM Isabella [name] MRN: [id number]  Principal Diagnosis: Bipolar 1 disorder, depressed, severe (HCC)  Secondary Diagnoses: Principal Problem:   Bipolar 1 disorder, depressed, severe (HCC)   Current Medications:  Current Facility-Administered Medications  Medication Dose Route Frequency Provider Last Rate Last Admin   acetaminophen (TYLENOL) tablet 650 mg  650 mg Oral Q6H PRN Jearld Lesch, NP       alum & mag hydroxide-simeth (MAALOX/MYLANTA) 200-200-20 MG/5ML suspension 30 mL  30 mL Oral Q4H PRN Durwin Nora, Rashaun M, NP       ARIPiprazole (ABILIFY) tablet 5 mg  5 mg Oral Daily Durwin Nora, Rashaun M, NP   5 mg at 07/17/23 4098   haloperidol (HALDOL) tablet 5 mg  5 mg Oral TID PRN Jearld Lesch, NP       And   diphenhydrAMINE (BENADRYL) capsule 50 mg  50 mg Oral TID PRN Jearld Lesch, NP       haloperidol lactate (HALDOL) injection 5 mg  5 mg Intramuscular TID PRN Jearld Lesch, NP       And   diphenhydrAMINE (BENADRYL) injection 50 mg  50 mg Intramuscular TID PRN Jearld Lesch, NP       And   LORazepam (ATIVAN) injection 2 mg  2 mg Intramuscular TID PRN Jearld Lesch, NP       haloperidol lactate (HALDOL) injection 10 mg  10 mg Intramuscular TID PRN Jearld Lesch, NP       And   diphenhydrAMINE (BENADRYL) injection 50 mg  50 mg Intramuscular TID PRN Jearld Lesch, NP       And   LORazepam (ATIVAN) injection 2 mg  2 mg Intramuscular TID PRN Jearld Lesch, NP       FLUoxetine (PROZAC) capsule 40 mg  40 mg Oral Daily Durwin Nora, Rashaun M, NP   40 mg at 07/17/23 1191   hydrOXYzine (ATARAX) tablet 25 mg  25 mg Oral TID PRN Jearld Lesch, NP       lamoTRIgine (LAMICTAL) tablet 100 mg  100 mg Oral Daily Durwin Nora, Rashaun M, NP   100 mg at 07/17/23 4782   magnesium hydroxide (MILK OF MAGNESIA) suspension 30 mL  30 mL Oral Daily PRN Jearld Lesch, NP       traZODone (DESYREL)  tablet 50 mg  50 mg Oral QHS PRN Jearld Lesch, NP       PTA Medications: Medications Prior to Admission  Medication Sig Dispense Refill Last Dose/Taking   atorvastatin (LIPITOR) 20 MG tablet Take 20 mg by mouth daily.      escitalopram (LEXAPRO) 10 MG tablet Take 10 mg by mouth daily.      FLUoxetine (PROZAC) 10 MG capsule Take 20 mg by mouth daily.      glipiZIDE (GLUCOTROL XL) 10 MG 24 hr tablet Take 10 mg by mouth daily.      hydrOXYzine (ATARAX) 50 MG tablet Take 25-50 mg by mouth at bedtime.      metFORMIN (GLUCOPHAGE) 1000 MG tablet Take 1,000 mg by mouth 2 (two) times daily.      OLANZapine (ZYPREXA) 5 MG tablet Take 5 mg by mouth at bedtime.       Patient Stressors: Marital or family conflict   Medication change or noncompliance    Patient Strengths: Motivation for treatment/growth  Supportive family/friends   Treatment Modalities: Medication Management, Group therapy, Case management,  1 to  1 session with clinician, Psychoeducation, Recreational therapy.   Physician Treatment Plan for Primary Diagnosis: Bipolar 1 disorder, depressed, severe (HCC) Long Term Goal(s):     Short Term Goals:    Medication Management: Evaluate patient's response, side effects, and tolerance of medication regimen.  Therapeutic Interventions: 1 to 1 sessions, Unit Group sessions and Medication administration.  Evaluation of Outcomes: Not Met  Physician Treatment Plan for Secondary Diagnosis: Principal Problem:   Bipolar 1 disorder, depressed, severe (HCC)  Long Term Goal(s):     Short Term Goals:       Medication Management: Evaluate patient's response, side effects, and tolerance of medication regimen.  Therapeutic Interventions: 1 to 1 sessions, Unit Group sessions and Medication administration.  Evaluation of Outcomes: Not Met   RN Treatment Plan for Primary Diagnosis: Bipolar 1 disorder, depressed, severe (HCC) Long Term Goal(s): Knowledge of disease and therapeutic regimen  to maintain health will improve  Short Term Goals: Ability to verbalize frustration and anger appropriately will improve, Ability to demonstrate self-control, Ability to participate in decision making will improve, Ability to verbalize feelings will improve, Ability to disclose and discuss suicidal ideas, Ability to identify and develop effective coping behaviors will improve, and Compliance with prescribed medications will improve  Medication Management: RN will administer medications as ordered by provider, will assess and evaluate patient's response and provide education to patient for prescribed medication. RN will report any adverse and/or side effects to prescribing provider.  Therapeutic Interventions: 1 on 1 counseling sessions, Psychoeducation, Medication administration, Evaluate responses to treatment, Monitor vital signs and CBGs as ordered, Perform/monitor CIWA, COWS, AIMS and Fall Risk screenings as ordered, Perform wound care treatments as ordered.  Evaluation of Outcomes: Not Met   LCSW Treatment Plan for Primary Diagnosis: Bipolar 1 disorder, depressed, severe (HCC) Long Term Goal(s): Safe transition to appropriate next level of care at discharge, Engage patient in therapeutic group addressing interpersonal concerns.  Short Term Goals: Engage patient in aftercare planning with referrals and resources, Increase social support, Increase ability to appropriately verbalize feelings, Increase emotional regulation, Facilitate acceptance of mental health diagnosis and concerns, Facilitate patient progression through stages of change regarding substance use diagnoses and concerns, Identify triggers associated with mental health/substance abuse issues, and Increase skills for wellness and recovery  Therapeutic Interventions: Assess for all discharge needs, 1 to 1 time with Social worker, Explore available resources and support systems, Assess for adequacy in community support network, Educate  family and significant other(s) on suicide prevention, Complete Psychosocial Assessment, Interpersonal group therapy.  Evaluation of Outcomes: Not Met   Progress in Treatment: Attending groups: No. Participating in groups: No. Taking medication as prescribed: Yes. Toleration medication: Yes. Family/Significant other contact made: No, will contact:  CSW to contact once permission is granted.  Patient understands diagnosis: Yes. and No. Discussing patient identified problems/goals with staff: Yes. and No. Medical problems stabilized or resolved: Yes. and No. Denies suicidal/homicidal ideation: Yes. Issues/concerns per patient self-inventory: No. Other: None  New problem(s) identified: No, Describe:  None  New Short Term/Long Term Goal(s):detox, elimination of symptoms of psychosis, medication management for mood stabilization; elimination of SI thoughts; development of comprehensive mental wellness/sobriety plan.    Patient Goals:  Patient reports that she will work to maintain daily grooming, taking medications and attending group.   Discharge Plan or Barriers: CSW to assist in the development of appropriate discharge plan.   Reason for Continuation of Hospitalization: Anxiety Delusions  Depression Suicidal ideation  Estimated Length of Stay: 1-7 days.  Last 3 [place] Suicide Severity Risk Score: Flowsheet Row Admission (Current) from 07/17/2023 in [facility] INPATIENT BEHAVIORAL MEDICINE ED from 07/16/2023 in [facility] Emergency Department at [facility] Admission (Discharged) from 05/22/2023 in [facility] INPATIENT BEHAVIORAL MEDICINE  C-SSRS RISK CATEGORY No Risk No Risk No Risk       Last PHQ 2/9 Scores:    07/16/2023   11:42 PM  Depression screen PHQ 2/9  Decreased Interest 1  Down, Depressed, Hopeless 1  PHQ - 2 Score 2  Altered sleeping 1  Tired, decreased energy 1  Change in appetite 1  Feeling bad or failure about yourself  1  Trouble concentrating 1  Moving slowly  or fidgety/restless 1  Suicidal thoughts 0  PHQ-9 Score 8    Scribe for Treatment Team: [name], LCSW 07/17/2023 10:53 AM

## 2023-07-17 NOTE — Plan of Care (Signed)
 Pt new to the unit tonight, hasn't had time to progress  Problem: Education: Goal: Knowledge of [place] [facility] Education information/materials will improve Outcome: Not Progressing Goal: Emotional status will improve Outcome: Not Progressing Goal: Mental status will improve Outcome: Not Progressing Goal: Verbalization of understanding the information provided will improve Outcome: Not Progressing   Problem: Activity: Goal: Interest or engagement in activities will improve Outcome: Not Progressing Goal: Sleeping patterns will improve Outcome: Not Progressing   Problem: Coping: Goal: Ability to verbalize frustrations and anger appropriately will improve Outcome: Not Progressing Goal: Ability to demonstrate self-control will improve Outcome: Not Progressing   Problem: Health Behavior/Discharge Planning: Goal: Identification of resources available to assist in meeting health care needs will improve Outcome: Not Progressing Goal: Compliance with treatment plan for underlying cause of condition will improve Outcome: Not Progressing   Problem: Physical Regulation: Goal: Ability to maintain clinical measurements within normal limits will improve Outcome: Not Progressing   Problem: Safety: Goal: Periods of time without injury will increase Outcome: Not Progressing

## 2023-07-18 DIAGNOSIS — F314 Bipolar disorder, current episode depressed, severe, without psychotic features: Secondary | ICD-10-CM | POA: Diagnosis not present

## 2023-07-18 LAB — LITHIUM LEVEL: Lithium Lvl: 0.18 mmol/L — ABNORMAL LOW (ref 0.60–1.20)

## 2023-07-18 LAB — GLUCOSE, CAPILLARY
Glucose-Capillary: 84 mg/dL (ref 70–99)
Glucose-Capillary: 116 mg/dL — ABNORMAL HIGH (ref 70–99)
Glucose-Capillary: 98 mg/dL (ref 70–99)

## 2023-07-18 MED ORDER — LORAZEPAM 0.5 MG PO TABS
0.5000 mg | ORAL_TABLET | Freq: Two times a day (BID) | ORAL | Status: AC
  Administered 2023-07-20 – 2023-07-21 (×2): 0.5 mg via ORAL
  Filled 2023-07-18 (×2): qty 1

## 2023-07-18 MED ORDER — LORAZEPAM 1 MG PO TABS
1.0000 mg | ORAL_TABLET | Freq: Two times a day (BID) | ORAL | Status: AC
  Administered 2023-07-18 – 2023-07-20 (×4): 1 mg via ORAL
  Filled 2023-07-18 (×4): qty 1

## 2023-07-18 NOTE — Group Note (Signed)
 [facility] LCSW Group Therapy Note   Group Date: 07/18/2023 Start Time: 1330 End Time: 1430  Type of Therapy/Topic:  Group Therapy:  Feelings about Diagnosis  Participation Level:  Active   Description of Group:    This group will allow patients to explore their thoughts and feelings about diagnoses they have received. Patients will be guided to explore their level of understanding and acceptance of these diagnoses. Facilitator will encourage patients to process their thoughts and feelings about the reactions of others to their diagnosis, and will guide patients in identifying ways to discuss their diagnosis with significant others in their lives. This group will be process-oriented, with patients participating in exploration of their own experiences as well as giving and receiving support and challenge from other group members.   Therapeutic Goals: 1. Patient will demonstrate understanding of diagnosis as evidence by identifying two or more symptoms of the disorder:  2. Patient will be able to express two feelings regarding the diagnosis 3. Patient will demonstrate ability to communicate their needs through discussion and/or role plays  Summary of Patient Progress: Patient attended group. Patient was an active participant.  Patient shared how her fathers physical illness has impacted her.  She discussed how the stigma to physical health vs mental health has impacted her. Patient displayed fair insight.    Therapeutic Modalities:   Cognitive Behavioral Therapy Brief Therapy Feelings Identification    Harden Mo, LCSW

## 2023-07-18 NOTE — Group Note (Signed)
 Date:  07/18/2023 Time:  8:47 PM  Group Topic/Focus:  Wrap-Up Group:   The focus of this group is to help patients review their daily goal of treatment and discuss progress on daily workbooks.    Participation Level:  Active  Participation Quality:  Appropriate  Affect:  Appropriate and Flat  Cognitive:  Alert and Lacking  Insight: Appropriate  Engagement in Group:  Limited and Resistant  Modes of Intervention:  Discussion, Education, and Orientation  Additional Comments:     Katina Dung 07/18/2023, 8:47 PM

## 2023-07-18 NOTE — Progress Notes (Addendum)
 Pt presents with a brighter affect than when this writer saw her last night. Pt denies SI/HI. Pt compliant with medication administration per MD orders. Pt given education, support, and encouragement to be active in her treatment plan. Pt being monitored Q 15 minutes for safety per unit protocol, remains safe on the unit

## 2023-07-18 NOTE — Progress Notes (Addendum)
   07/18/23 1800  Psych Admission Type (Psych Patients Only)  Admission Status Voluntary  Psychosocial Assessment  Patient Complaints Depression  Eye Contact Brief  Facial Expression Flat  Affect Appropriate to circumstance  Speech Soft  Interaction Minimal  Motor Activity Slow  Appearance/Hygiene In scrubs  Behavior Characteristics Cooperative  Mood Pleasant  Thought Process  Coherency Disorganized  Content WDL  Delusions None reported or observed  Perception Hallucinations  Hallucination Auditory  Judgment Impaired  Confusion Mild  Danger to Self  Current suicidal ideation? Denies  Agreement Not to Harm Self Yes  Description of Agreement verbal  Danger to Others  Danger to Others None reported or observed

## 2023-07-18 NOTE — Plan of Care (Signed)

## 2023-07-18 NOTE — Group Note (Signed)
 Date:  07/18/2023 Time:  10:08 AM  Group Topic/Focus:   Goals Group:   The focus of this group is to help patients establish daily goals to achieve during treatment and discuss how the patient can incorporate goal setting into their daily lives to aide in recovery.  Overcoming Stress:   The focus of this group is to define stress and help patients assess their triggers.   Participation Level:  Did Not Attend   Isabella [name] [name] 07/18/2023, 10:08 AM

## 2023-07-18 NOTE — Plan of Care (Signed)
   Problem: Education: Goal: Emotional status will improve Outcome: Not Progressing Goal: Mental status will improve Outcome: Not Progressing

## 2023-07-18 NOTE — BHH Counselor (Signed)
 Adult Comprehensive Assessment  Patient ID: Isabella [name], female   DOB: [birth year], 48 y.o.   MRN: [id number]  Information Source: Information source: Patient  Current Stressors:  Patient states their primary concerns and needs for treatment are:: "my mental health" Patient states their goals for this hospitilization and ongoing recovery are:: "family finances to get more better"  Living/Environment/Situation:  Living Arrangements: Other relatives  Family History:  Marital status: Separated Separated, when?: "For a couple years." What types of issues is patient dealing with in the relationship?: "he separated from me and didn't talk to me" Are you sexually active?: Yes What is your sexual orientation?: "Bisexual" Has your sexual activity been affected by drugs, alcohol, medication, or emotional stress?: "stress" Does patient have children?: Yes How many children?: 2 How is patient's relationship with their children?: "I don't talk to them everyday but it's good"  Childhood History:  By whom was/is the patient raised?: Both parents Does patient have siblings?: Yes Number of Siblings: 3 Description of patient's current relationship with siblings: "Good" Did patient suffer any verbal/emotional/physical/sexual abuse as a child?: No Did patient suffer from severe childhood neglect?: No Has patient ever been sexually abused/assaulted/raped as an adolescent or adult?: No Was the patient ever a victim of a crime or a disaster?: No Witnessed domestic violence?: No Has patient been affected by domestic violence as an adult?: No  Education:  Highest grade of school patient has completed: "8th grade" Currently a student?: No Learning disability?: No  Employment/Work Situation:   Employment Situation: Unemployed Why is Patient on Disability: Pt denies. How Long has Patient Been on Disability: Pt denies. What is the Longest Time Patient has Held a Job?: Pt denies. Where was  the Patient Employed at that Time?: Pt denies. Has Patient ever Been in the [employer]?: No  Financial Resources:   Financial resources: Medicaid Does patient have a representative payee or guardian?: No  Alcohol/Substance Abuse:   What has been your use of drugs/alcohol within the last 12 months?: Pt denies. If attempted suicide, did drugs/alcohol play a role in this?: No Alcohol/Substance Abuse Treatment Hx: Denies past history Has alcohol/substance abuse ever caused legal problems?: No  Social Support System:   Conservation officer, nature Support System: Fair Museum/gallery exhibitions officer System: "my mom and dad" Type of faith/religion: "Catholic" How does patient's faith help to cope with current illness?: "I got the Bible"  Leisure/Recreation:   Do You Have Hobbies?: Yes Leisure and Hobbies: "walk, reading"  Strengths/Needs:   What is the patient's perception of their strengths?: "I don't know" Patient states they can use these personal strengths during their treatment to contribute to their recovery: Pt denies. Patient states these barriers may affect/interfere with their treatment: Pt denies. Patient states these barriers may affect their return to the community: Pt denies. Other important information patient would like considered in planning for their treatment: Pt denies.  Discharge Plan:   Currently receiving community mental health services: Yes (From Whom) [facility]) Patient states concerns and preferences for aftercare planning are: Pt reports that she would like to continue with [facility] Patient states they will know when they are safe and ready for discharge when: Pt denies. Does patient have access to transportation?: Yes Does patient have financial barriers related to discharge medications?: No Plan for no access to transportation at discharge: CSW to assist with transportation needs. Will patient be returning to same living situation after discharge?:  Yes  Summary/Recommendations:   Summary and Recommendations (to be completed by the  evaluator): Patient is a 48 year old female [place] [place] [facility]  Patient presents to the hospital for concerns for increased depression and hypoactivity.  Patient reports that she has noted a decline in her mental health. She reports that her current mental health has been triggered by her unemployment status, loss of disability and declining health of both of her parents.  She reports that she has been taking her medication but they have not been effective.  She reports that she is current with her mental health provider and plans to continue with her current provider.  Recommendations include: crisis stabilization, therapeutic milieu, encourage group attendance and participation, medication management for detox/mood stabilization and development of comprehensive mental wellness/sobriety plan.  Harden Mo. 07/18/2023

## 2023-07-18 NOTE — Group Note (Signed)
 Date:  07/18/2023 Time:  4:46 PM  Group Topic/Focus:  Wellness Toolbox:   The focus of this group is to discuss various aspects of wellness, balancing those aspects and exploring ways to increase the ability to experience wellness.  Patients will create a wellness toolbox for use upon discharge.    Participation Level:  Minimal  Participation Quality:  Appropriate  Affect:  Appropriate  Cognitive:  Appropriate  Insight: Appropriate  Engagement in Group:  Limited  Modes of Intervention:  Activity  Additional Comments:    Wilford Corner 07/18/2023, 4:46 PM

## 2023-07-18 NOTE — Group Note (Signed)
 Recreation Therapy Group Note   Group Topic:Coping Skills  Group Date: 07/18/2023 Start Time: 1000 End Time: 1050 Facilitators: Rosina Lowenstein, LRT, CTRS Location:  Craft Room  Group Description: Mind Map.  Patient was provided a blank template of a diagram with 32 blank boxes in a tiered system, branching from the center (similar to a bubble chart). LRT directed patients to label the middle of the diagram "Coping Skills". LRT and patients then came up with 8 different coping skills as examples. Pt were directed to record their coping skills in the 2nd tier boxes closest to the center.  Patients would then share their coping skills with the group as LRT wrote them out. LRT gave a handout of 99 different coping skills at the end of group.   Goal Area(s) Addressed: Patients will be able to define "coping skills". Patient will identify new coping skills.  Patient will increase communication.   Affect/Mood: N/A   Participation Level: Did not attend    Clinical Observations/Individualized Feedback: Patient did not attend group.   Plan: Continue to engage patient in RT group sessions 2-3x/week.   Rosina Lowenstein, LRT, CTRS 07/18/2023 12:26 PM
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
## 2023-07-18 NOTE — Progress Notes (Signed)
 Pt isolative to her room tonight and presents as responding to internal stimuli. Pt denies SI/HI. Pt compliant with medication administration per MD orders. Pt given education, support, and encouragement to be active in her treatment plan. Pt being monitored Q 15 minutes for safety per unit protocol, remains safe on the unie

## 2023-07-19 DIAGNOSIS — F314 Bipolar disorder, current episode depressed, severe, without psychotic features: Secondary | ICD-10-CM | POA: Diagnosis not present

## 2023-07-19 LAB — GLUCOSE, CAPILLARY
Glucose-Capillary: 108 mg/dL — ABNORMAL HIGH (ref 70–99)
Glucose-Capillary: 106 mg/dL — ABNORMAL HIGH (ref 70–99)
Glucose-Capillary: 92 mg/dL (ref 70–99)

## 2023-07-19 NOTE — Group Note (Signed)
 Date:  07/19/2023 Time:  1:55 PM  Group Topic/Focus:  Movie Group  The purpose of this group is to allow patients to watch a relaxing educational movie about coping with adult life and trials ongoing in adulthood.     Participation Level:  Active  Participation Quality:  Appropriate  Affect:  Appropriate  Cognitive:  Alert and Appropriate  Insight: Appropriate  Engagement in Group:  Engaged  Modes of Intervention:  Activity and Education  Additional Comments:    Marta Antu 07/19/2023, 1:55 PM

## 2023-07-19 NOTE — Group Note (Signed)
 Date:  07/19/2023 Time:  9:39 PM  Group Topic/Focus:  Healthy Communication:   The focus of this group is to discuss communication, barriers to communication, as well as healthy ways to communicate with others.    Participation Level:  Active  Participation Quality:  Appropriate  Affect:  Appropriate  Cognitive:  Appropriate  Insight: Appropriate  Engagement in Group:  Engaged  Modes of Intervention:  Activity  Additional Comments:    [name] 07/19/2023, 9:39 PM

## 2023-07-19 NOTE — Progress Notes (Signed)
   07/19/23 1000  Psych Admission Type (Psych Patients Only)  Admission Status Voluntary  Psychosocial Assessment  Patient Complaints Depression  Eye Contact Fair  Facial Expression Flat  Affect Appropriate to circumstance  Speech Soft  Interaction Minimal  Motor Activity Slow  Appearance/Hygiene Unremarkable  Behavior Characteristics Cooperative  Mood Pleasant  Thought Process  Coherency WDL  Content WDL  Delusions None reported or observed  Perception WDL  Hallucination None reported or observed  Judgment Impaired  Confusion None  Danger to Self  Current suicidal ideation? Denies  Danger to Others  Danger to Others None reported or observed
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
## 2023-07-19 NOTE — Plan of Care (Signed)
   Problem: Education: Goal: Emotional status will improve Outcome: Progressing Goal: Mental status will improve Outcome: Progressing Goal: Verbalization of understanding the information provided will improve Outcome: Progressing

## 2023-07-19 NOTE — Group Note (Signed)
 [facility] LCSW Group Therapy Note   Group Date: 07/19/2023 Start Time: 1400 End Time: 1450   Type of Therapy/Topic:  Group Therapy:  Emotion Regulation  Participation Level:  None    Description of Group:    The purpose of this group is to assist patients in learning to regulate negative emotions and experience positive emotions. Patients will be guided to discuss ways in which they have been vulnerable to their negative emotions. These vulnerabilities will be juxtaposed with experiences of positive emotions or situations, and patients challenged to use positive emotions to combat negative ones. Special emphasis will be placed on coping with negative emotions in conflict situations, and patients will process healthy conflict resolution skills.  Therapeutic Goals: Patient will identify two positive emotions or experiences to reflect on in order to balance out negative emotions:  Patient will label two or more emotions that they find the most difficult to experience:  Patient will be able to demonstrate positive conflict resolution skills through discussion or role plays:   Summary of Patient Progress: Patient was present for the entirety of the group process. She participated in the icebreaker but did not participate in the larger group discussion. Insight into herself and the topic remain questionable.    Therapeutic Modalities:   Cognitive Behavioral Therapy Feelings Identification Dialectical Behavioral Therapy   Glenis Smoker, LCSW

## 2023-07-20 DIAGNOSIS — F314 Bipolar disorder, current episode depressed, severe, without psychotic features: Secondary | ICD-10-CM | POA: Diagnosis not present

## 2023-07-20 LAB — GLUCOSE, CAPILLARY: Glucose-Capillary: 86 mg/dL (ref 70–99)

## 2023-07-20 NOTE — Plan of Care (Signed)

## 2023-07-20 NOTE — Progress Notes (Addendum)
   07/20/23 1100  Psych Admission Type (Psych Patients Only)  Admission Status Voluntary  Psychosocial Assessment  Patient Complaints Depression  Eye Contact Fair  Facial Expression Flat  Affect Appropriate to circumstance  Speech Soft  Interaction Assertive  Motor Activity Slow  Appearance/Hygiene Unremarkable  Behavior Characteristics Cooperative;Calm  Mood Pleasant  Thought Process  Coherency WDL  Content WDL  Delusions None reported or observed  Perception WDL  Hallucination None reported or observed  Judgment Impaired  Confusion None  Danger to Self  Current suicidal ideation? Denies  Danger to Others  Danger to Others None reported or observed   Pt presents, pleasant, cooperative and thoughts, seems clear and intact. Pt focused on going home to help with elderly parents and she has been compliant with her medication regimen.

## 2023-07-20 NOTE — Group Note (Signed)
 Date:  07/20/2023 Time:  11:36 PM  Group Topic/Focus:  Wrap-Up Group:   The focus of this group is to help patients review their daily goal of treatment and discuss progress on daily workbooks.    Participation Level:  Active  Participation Quality:  Appropriate  Affect:  Appropriate  Cognitive:  Appropriate  Insight: Appropriate  Engagement in Group:  Engaged  Modes of Intervention:  Discussion    Lenore Cordia 07/20/2023, 11:36 PM

## 2023-07-20 NOTE — Plan of Care (Signed)
  Problem: Coping: Goal: Ability to verbalize frustrations and anger appropriately will improve Outcome: Progressing Goal: Ability to demonstrate self-control will improve Outcome: Progressing   Problem: Health Behavior/Discharge Planning: Goal: Identification of resources available to assist in meeting health care needs will improve Outcome: Progressing Goal: Compliance with treatment plan for underlying cause of condition will improve Outcome: Progressing   Problem: Physical Regulation: Goal: Ability to maintain clinical measurements within normal limits will improve Outcome: Progressing   Problem: Safety: Goal: Periods of time without injury will increase Outcome: Progressing   

## 2023-07-20 NOTE — Group Note (Signed)
 Date:  07/20/2023 Time:  10:09 AM  Group Topic/Focus:  Crisis Planning:   The purpose of this group is to help patients create a crisis plan for use upon discharge or in the future, as needed.    Participation Level:  Active  Participation Quality:  Appropriate  Affect:  Appropriate  Cognitive:  Appropriate  Insight: Appropriate  Engagement in Group:  Engaged  Modes of Intervention:  Activity  Additional Comments:    Isabella [name] Isabella [name] 07/20/2023, 10:09 AM
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
## 2023-07-20 NOTE — Group Note (Unsigned)
 [facility] LCSW Group Therapy Note    Group Date: 07/20/2023 Start Time: 1300 End Time: 1400  Type of Therapy and Topic:  Group Therapy:  Overcoming Obstacles  Participation Level:  {[facility] PARTICIPATION LEVEL:22264:::1}  Mood:  Description of Group:   In this group patients will be encouraged to explore what they see as obstacles to their own wellness and recovery. They will be guided to discuss their thoughts, feelings, and behaviors related to these obstacles. The group will process together ways to cope with barriers, with attention given to specific choices patients can make. Each patient will be challenged to identify changes they are motivated to make in order to overcome their obstacles. This group will be process-oriented, with patients participating in exploration of their own experiences as well as giving and receiving support and challenge from other group members.  Therapeutic Goals: 1. Patient will identify personal and current obstacles as they relate to admission. 2. Patient will identify barriers that currently interfere with their wellness or overcoming obstacles.  3. Patient will identify feelings, thought process and behaviors related to these barriers. 4. Patient will identify two changes they are willing to make to overcome these obstacles:    Summary of Patient Progress   ***   Therapeutic Modalities:   Cognitive Behavioral Therapy Solution Focused Therapy Motivational Interviewing Relapse Prevention Therapy   [name], LCSW

## 2023-07-20 NOTE — Group Note (Signed)
 Recreation Therapy Group Note   Group Topic:Healthy Support Systems  Group Date: 07/20/2023 Start Time: 1000 End Time: 1050 Facilitators: Rosina Lowenstein, LRT, CTRS Location:  Craft Room  Group Description: Straw Bridge. Individually, patients were given 10 plastic drinking straws and an equal length of masking tape. Using the materials provided, patients were instructed to build a free-standing bridge-like structure to suspend an everyday item (ex: puzzle box) off the floor or table surface. All materials were required to be used in Secondary school teacher. LRT facilitated post-activity discussion reviewing how we, humans, are like the structure we built; when things get too heavy in our life and we do not have adequate supports/coping skills, then we will fall just like the straw-built structure will. LRT focused on how having a "base" or structure on the bottom was necessary for the object to stand, meaning we must be secure and stable first before building on ourselves or others. Patients were encouraged to name 2 healthy supports in their life and reflect on how the skills used in this activity can be generalized to daily life post discharge.   Goal Area(s) Addressed:    Patient will identify two healthy support systems in their life.   Patient will work on Product manager.   Patient will verbalize the importance of having a strong and steady "base".    Patient will follow multi-step directions.   Patients will engage in creativity and use all provided materials.    Affect/Mood: N/A   Participation Level: Did not attend    Clinical Observations/Individualized Feedback: Patient did not attend group.   Plan: Continue to engage patient in RT group sessions 2-3x/week.   Rosina Lowenstein, LRT, CTRS 07/20/2023 12:28 PM

## 2023-07-21 ENCOUNTER — Other Ambulatory Visit: Payer: Self-pay

## 2023-07-21 DIAGNOSIS — F314 Bipolar disorder, current episode depressed, severe, without psychotic features: Secondary | ICD-10-CM | POA: Diagnosis not present

## 2023-07-21 LAB — GLUCOSE, CAPILLARY
Glucose-Capillary: 92 mg/dL (ref 70–99)
Glucose-Capillary: 96 mg/dL (ref 70–99)
Glucose-Capillary: 92 mg/dL (ref 70–99)

## 2023-07-21 MED ORDER — HYDROXYZINE HCL 25 MG PO TABS
25.0000 mg | ORAL_TABLET | Freq: Three times a day (TID) | ORAL | 0 refills | Status: DC | PRN
  Filled 2023-07-21: qty 30, 10d supply, fill #0

## 2023-07-21 MED ORDER — TRAZODONE HCL 50 MG PO TABS
50.0000 mg | ORAL_TABLET | Freq: Every evening | ORAL | 0 refills | Status: DC | PRN
  Filled 2023-07-21: qty 30, 30d supply, fill #0

## 2023-07-21 MED ORDER — PALIPERIDONE ER 3 MG PO TB24
3.0000 mg | ORAL_TABLET | Freq: Every day | ORAL | 0 refills | Status: DC
  Filled 2023-07-21: qty 30, 30d supply, fill #0

## 2023-07-21 MED ORDER — LORAZEPAM 0.5 MG PO TABS
0.5000 mg | ORAL_TABLET | Freq: Every day | ORAL | 0 refills | Status: DC
Start: 2023-07-21 — End: 2023-07-22

## 2023-07-21 MED ORDER — MELATONIN 5 MG PO TABS
5.0000 mg | ORAL_TABLET | Freq: Every day | ORAL | 0 refills | Status: AC
  Filled 2023-07-21: qty 30, 30d supply, fill #0

## 2023-07-21 MED ORDER — ATORVASTATIN CALCIUM 20 MG PO TABS
20.0000 mg | ORAL_TABLET | Freq: Every day | ORAL | 0 refills | Status: AC
  Filled 2023-07-21: qty 30, 30d supply, fill #0

## 2023-07-21 MED ORDER — LITHIUM CARBONATE ER 300 MG PO TBCR
300.0000 mg | EXTENDED_RELEASE_TABLET | Freq: Two times a day (BID) | ORAL | 0 refills | Status: DC
  Filled 2023-07-21 (×2): qty 60, 30d supply, fill #0

## 2023-07-21 MED ORDER — FLUOXETINE HCL 20 MG PO CAPS
60.0000 mg | ORAL_CAPSULE | Freq: Every day | ORAL | 3 refills | Status: DC
Start: 2023-07-22 — End: 2023-10-24
  Filled 2023-07-21: qty 90, 30d supply, fill #0

## 2023-07-21 MED ORDER — METFORMIN HCL 500 MG PO TABS
500.0000 mg | ORAL_TABLET | Freq: Two times a day (BID) | ORAL | 0 refills | Status: DC
  Filled 2023-07-21: qty 60, 30d supply, fill #0

## 2023-07-21 NOTE — Plan of Care (Signed)
   Problem: Education: Goal: Emotional status will improve Outcome: Progressing Goal: Mental status will improve Outcome: Progressing Goal: Verbalization of understanding the information provided will improve Outcome: Progressing

## 2023-07-21 NOTE — Group Note (Signed)
 Recreation Therapy Group Note   Group Topic:Leisure Education  Group Date: 07/21/2023 Start Time: 1000 End Time: 1100 Facilitators: Rosina Lowenstein, LRT, CTRS Location:  Craft Room  Group Description: Leisure. Patients were given the option to choose from singing karaoke, coloring mandalas, using oil pastels, journaling, or playing with play-doh. LRT and pts discussed the meaning of leisure, the importance of participating in leisure during their free time/when they're outside of the hospital, as well as how our leisure interests can also serve as coping skills.   Goal Area(s) Addressed:  Patient will identify a current leisure interest.  Patient will learn the definition of "leisure". Patient will practice making a positive decision. Patient will have the opportunity to try a new leisure activity. Patient will communicate with peers and LRT.    Affect/Mood: N/A   Participation Level: Did not attend    Clinical Observations/Individualized Feedback: Patient did not attend group.   Plan: Continue to engage patient in RT group sessions 2-3x/week.   Rosina Lowenstein, LRT, CTRS 07/21/2023 12:12 PM

## 2023-07-21 NOTE — Progress Notes (Signed)
 Patient in bed sleeping during med pass. Unable to awaken for night meds. Early shift patient pleasant and cooperative. Denies SI, HI, AVH. States need to go home to take care of sick elderly parent. Noted on phone talking with family members in no distress. Encouragement and support provided. Safety checks maintained. Medications given as prescribed. Pt receptive and remains safe on unit with q 15 min checks.

## 2023-07-21 NOTE — Plan of Care (Signed)
  Problem: Education: Goal: Knowledge of [place] [facility] Education information/materials will improve Outcome: Progressing Goal: Emotional status will improve Outcome: Progressing Goal: Mental status will improve Outcome: Progressing Goal: Verbalization of understanding the information provided will improve Outcome: Progressing   Problem: Coping: Goal: Ability to verbalize frustrations and anger appropriately will improve Outcome: Progressing

## 2023-07-21 NOTE — Plan of Care (Signed)
  Problem: Education: Goal: Knowledge of [place] [facility] Education information/materials will improve 07/21/2023 0314 by Weldon Picking, RN Outcome: Progressing 07/21/2023 0313 by Weldon Picking, RN Outcome: Progressing Goal: Emotional status will improve 07/21/2023 0314 by Weldon Picking, RN Outcome: Progressing 07/21/2023 0313 by Weldon Picking, RN Outcome: Progressing Goal: Mental status will improve 07/21/2023 0314 by Weldon Picking, RN Outcome: Progressing 07/21/2023 0313 by Weldon Picking, RN Outcome: Progressing Goal: Verbalization of understanding the information provided will improve Outcome: Progressing   Problem: Coping: Goal: Ability to verbalize frustrations and anger appropriately will improve 07/21/2023 0314 by Weldon Picking, RN Outcome: Progressing 07/21/2023 0313 by Weldon Picking, RN Outcome: Progressing

## 2023-07-21 NOTE — Progress Notes (Signed)
   07/21/23 0930  Psych Admission Type (Psych Patients Only)  Admission Status Voluntary  Psychosocial Assessment  Patient Complaints None  Eye Contact Fair  Facial Expression Flat  Affect Appropriate to circumstance  Speech Unremarkable  Interaction Forwards little  Motor Activity Other (Comment) (Unremarkable)  Appearance/Hygiene Unremarkable  Behavior Characteristics Cooperative;Appropriate to situation;Calm  Mood Pleasant  Thought Process  Coherency Circumstantial  Content WDL  Delusions None reported or observed  Perception WDL  Hallucination None reported or observed  Judgment WDL  Confusion WDL  Danger to Self  Current suicidal ideation? Denies  Agreement Not to Harm Self Yes  Description of Agreement Verbal  Danger to Others  Danger to Others None reported or observed

## 2023-07-21 NOTE — Plan of Care (Signed)
  Problem: Education: Goal: Mental status will improve Outcome: Progressing Goal: Verbalization of understanding the information provided will improve Outcome: Progressing   Problem: Activity: Goal: Interest or engagement in activities will improve Outcome: Progressing Goal: Sleeping patterns will improve Outcome: Progressing   Problem: Coping: Goal: Ability to verbalize frustrations and anger appropriately will improve Outcome: Progressing   Problem: Safety: Goal: Periods of time without injury will increase Outcome: Progressing

## 2023-07-21 NOTE — Group Note (Signed)
 Date:  07/21/2023 Time:  4:42 PM  Group Topic/Focus:  Activity Group: The focus of the group is to promote activity for the patients and to encourage them to go outside in the courtyard and get some fresh air and some exercise.    Participation Level:  Did Not Attend   Isabella [name] 07/21/2023, 4:42 PM

## 2023-07-21 NOTE — Group Note (Unsigned)
 Date:  07/21/2023 Time:  1:33 PM  Group Topic/Focus:  Rediscovering Joy:   The focus of this group is to explore various ways to relieve stress in a positive manner.     Participation Level:  {[facility] PARTICIPATION ZOXWR:60454}  Participation Quality:  {[facility] PARTICIPATION QUALITY:22265}  Affect:  {[facility] AFFECT:22266}  Cognitive:  {[facility] COGNITIVE:22267}  Insight: {[facility] Insight2:20797}  Engagement in Group:  {[facility] ENGAGEMENT IN UJWJX:91478}  Modes of Intervention:  {[facility] MODES OF INTERVENTION:22269}  Additional Comments:  ***  Isabella [name] 07/21/2023, 1:33 PM

## 2023-07-21 NOTE — Group Note (Signed)
 Date:  07/21/2023 Time:  9:40 PM  Group Topic/Focus:  Wrap-Up Group:   The focus of this group is to help patients review their daily goal of treatment and discuss progress on daily workbooks.    Participation Level:  Active  Participation Quality:  Appropriate  Affect:  Appropriate  Cognitive:  Appropriate  Insight: Appropriate  Engagement in Group:  Engaged  Modes of Intervention:  Discussion  Additional Comments:    [name] 07/21/2023, 9:40 PM
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
## 2023-07-22 DIAGNOSIS — F314 Bipolar disorder, current episode depressed, severe, without psychotic features: Secondary | ICD-10-CM | POA: Diagnosis not present

## 2023-07-22 LAB — GLUCOSE, CAPILLARY
Glucose-Capillary: 97 mg/dL (ref 70–99)
Glucose-Capillary: 111 mg/dL — ABNORMAL HIGH (ref 70–99)

## 2023-07-22 MED ORDER — LORAZEPAM 0.5 MG PO TABS
0.5000 mg | ORAL_TABLET | Freq: Every day | ORAL | 0 refills | Status: DC
Start: 2023-07-22 — End: 2023-08-17

## 2023-07-22 MED ORDER — PALIPERIDONE ER 3 MG PO TB24: 3.0000 mg | ORAL_TABLET | Freq: Every day | ORAL | 0 refills | Status: DC

## 2023-07-22 MED ORDER — PALIPERIDONE ER 3 MG PO TB24
3.0000 mg | ORAL_TABLET | Freq: Every day | ORAL | 0 refills | Status: DC
Start: 2023-07-22 — End: 2023-08-17

## 2023-07-22 NOTE — Progress Notes (Signed)
 Pt presents, pleasant, cooperative and thoughts, seems clear and intact. Pt focused on going home to assist family and she has been compliant with her medication regimen. Pt spent the evening in bed, denied SI, plan or intent.Pt managed on q 15 min rounds    07/20/23 1100   Psych Admission Type (Psych Patients Only)  Admission Status Voluntary  Psychosocial Assessment  Patient Complaints Depression  Eye Contact Fair  Facial Expression Flat  Affect Appropriate to circumstance  Speech Soft  Interaction Assertive  Motor Activity Slow  Appearance/Hygiene Unremarkable  Behavior Characteristics Cooperative;Calm  Mood Pleasant  Thought Process  Coherency WDL  Content WDL  Delusions None reported or observed  Perception WDL  Hallucination None reported or observed  Judgment Impaired  Confusion None  Danger to Self  Current suicidal ideation? Denies  Danger to Others  Danger to Others None reported or observed

## 2023-07-22 NOTE — [facility] Suicide Risk Assessment (Signed)
 [facility] INPATIENT:  Family/Significant Other Suicide Prevention Education  Suicide Prevention Education:  Contact Attempts: Margit Hanks, mother, [phone], has been identified by the patient as the family member/significant other with whom the patient will be residing, and identified as the person(s) who will aid the patient in the event of a mental health crisis.  With written consent from the patient, two attempts were made to provide suicide prevention education, prior to and/or following the patient's discharge.  We were unsuccessful in providing suicide prevention education.  A suicide education pamphlet was given to the patient to share with family/significant other.  Date and time of first attempt: 07/22/23 at 9:57 am Date and time of second attempt:second attempt needed   Marshell Levan 07/22/2023, 9:57 AM

## 2023-07-22 NOTE — BH IP Treatment Plan (Signed)
 Interdisciplinary Treatment and Diagnostic Plan Update  07/22/2023 Time of Session: 9:50 am Isabella [name] MRN: [id number]  Principal Diagnosis: Severe bipolar I disorder, current or most recent episode depressed (HCC)  Secondary Diagnoses: Principal Problem:   Severe bipolar I disorder, current or most recent episode depressed (HCC)   Current Medications:  Current Facility-Administered Medications  Medication Dose Route Frequency Provider Last Rate Last Admin   acetaminophen (TYLENOL) tablet 650 mg  650 mg Oral Q6H PRN Jearld Lesch, NP       alum & mag hydroxide-simeth (MAALOX/MYLANTA) 200-200-20 MG/5ML suspension 30 mL  30 mL Oral Q4H PRN Dixon, Rashaun M, NP       haloperidol (HALDOL) tablet 5 mg  5 mg Oral TID PRN Jearld Lesch, NP       And   diphenhydrAMINE (BENADRYL) capsule 50 mg  50 mg Oral TID PRN Jearld Lesch, NP       haloperidol lactate (HALDOL) injection 5 mg  5 mg Intramuscular TID PRN Jearld Lesch, NP       And   diphenhydrAMINE (BENADRYL) injection 50 mg  50 mg Intramuscular TID PRN Jearld Lesch, NP       And   LORazepam (ATIVAN) injection 2 mg  2 mg Intramuscular TID PRN Jearld Lesch, NP       haloperidol lactate (HALDOL) injection 10 mg  10 mg Intramuscular TID PRN Jearld Lesch, NP       And   diphenhydrAMINE (BENADRYL) injection 50 mg  50 mg Intramuscular TID PRN Jearld Lesch, NP       And   LORazepam (ATIVAN) injection 2 mg  2 mg Intramuscular TID PRN Jearld Lesch, NP       feeding supplement (GLUCERNA SHAKE) (GLUCERNA SHAKE) liquid 237 mL  237 mL Oral BID BM Myriam Forehand, NP   237 mL at 07/21/23 1500   FLUoxetine (PROZAC) capsule 60 mg  60 mg Oral Daily Myriam Forehand, NP   60 mg at 07/22/23 9147   hydrOXYzine (ATARAX) tablet 25 mg  25 mg Oral TID PRN Jearld Lesch, NP       lithium carbonate (LITHOBID) ER tablet 300 mg  300 mg Oral Q12H Myriam Forehand, NP   300 mg at 07/22/23 8295   magnesium hydroxide (MILK OF  MAGNESIA) suspension 30 mL  30 mL Oral Daily PRN Jearld Lesch, NP       melatonin tablet 5 mg  5 mg Oral QHS Myriam Forehand, NP   5 mg at 07/21/23 2137   metFORMIN (GLUCOPHAGE) tablet 500 mg  500 mg Oral BID WC Myriam Forehand, NP   500 mg at 07/22/23 6213   paliperidone (INVEGA) 24 hr tablet 3 mg  3 mg Oral Daily Myriam Forehand, NP   3 mg at 07/22/23 0865   traZODone (DESYREL) tablet 50 mg  50 mg Oral QHS PRN Jearld Lesch, NP   50 mg at 07/19/23 2142   PTA Medications: Medications Prior to Admission  Medication Sig Dispense Refill Last Dose/Taking   escitalopram (LEXAPRO) 10 MG tablet Take 10 mg by mouth daily.      FLUoxetine (PROZAC) 10 MG capsule Take 20 mg by mouth daily.      glipiZIDE (GLUCOTROL XL) 10 MG 24 hr tablet Take 10 mg by mouth daily.      hydrOXYzine (ATARAX) 50 MG tablet Take 25-50 mg by mouth at bedtime.  metFORMIN (GLUCOPHAGE) 1000 MG tablet Take 1,000 mg by mouth 2 (two) times daily.      OLANZapine (ZYPREXA) 5 MG tablet Take 5 mg by mouth at bedtime.      [DISCONTINUED] atorvastatin (LIPITOR) 20 MG tablet Take 20 mg by mouth daily.       Patient Stressors: Marital or family conflict   Medication change or noncompliance    Patient Strengths: Motivation for treatment/growth  Supportive family/friends   Treatment Modalities: Medication Management, Group therapy, Case management,  1 to 1 session with clinician, Psychoeducation, Recreational therapy.   Physician Treatment Plan for Primary Diagnosis: Severe bipolar I disorder, current or most recent episode depressed (HCC) Long Term Goal(s): Improvement in symptoms so as ready for discharge   Short Term Goals: Ability to identify changes in lifestyle to reduce recurrence of condition will improve Ability to verbalize feelings will improve Ability to disclose and discuss suicidal ideas Ability to demonstrate self-control will improve Ability to identify and develop effective coping behaviors will  improve Ability to maintain clinical measurements within normal limits will improve Compliance with prescribed medications will improve Ability to identify triggers associated with substance abuse/mental health issues will improve  Medication Management: Evaluate patient's response, side effects, and tolerance of medication regimen.  Therapeutic Interventions: 1 to 1 sessions, Unit Group sessions and Medication administration.  Evaluation of Outcomes: Progressing  Physician Treatment Plan for Secondary Diagnosis: Principal Problem:   Severe bipolar I disorder, current or most recent episode depressed (HCC)  Long Term Goal(s): Improvement in symptoms so as ready for discharge   Short Term Goals: Ability to identify changes in lifestyle to reduce recurrence of condition will improve Ability to verbalize feelings will improve Ability to disclose and discuss suicidal ideas Ability to demonstrate self-control will improve Ability to identify and develop effective coping behaviors will improve Ability to maintain clinical measurements within normal limits will improve Compliance with prescribed medications will improve Ability to identify triggers associated with substance abuse/mental health issues will improve     Medication Management: Evaluate patient's response, side effects, and tolerance of medication regimen.  Therapeutic Interventions: 1 to 1 sessions, Unit Group sessions and Medication administration.  Evaluation of Outcomes: Progressing   RN Treatment Plan for Primary Diagnosis: Severe bipolar I disorder, current or most recent episode depressed (HCC) Long Term Goal(s): Knowledge of disease and therapeutic regimen to maintain health will improve  Short Term Goals: Ability to remain free from injury will improve, Ability to verbalize frustration and anger appropriately will improve, Ability to demonstrate self-control, Ability to participate in decision making will improve,  Ability to verbalize feelings will improve, Ability to disclose and discuss suicidal ideas, Ability to identify and develop effective coping behaviors will improve, and Compliance with prescribed medications will improve  Medication Management: RN will administer medications as ordered by provider, will assess and evaluate patient's response and provide education to patient for prescribed medication. RN will report any adverse and/or side effects to prescribing provider.  Therapeutic Interventions: 1 on 1 counseling sessions, Psychoeducation, Medication administration, Evaluate responses to treatment, Monitor vital signs and CBGs as ordered, Perform/monitor CIWA, COWS, AIMS and Fall Risk screenings as ordered, Perform wound care treatments as ordered.  Evaluation of Outcomes: Progressing   LCSW Treatment Plan for Primary Diagnosis: Severe bipolar I disorder, current or most recent episode depressed (HCC) Long Term Goal(s): Safe transition to appropriate next level of care at discharge, Engage patient in therapeutic group addressing interpersonal concerns.  Short Term Goals: Engage patient in  aftercare planning with referrals and resources, Increase social support, Increase ability to appropriately verbalize feelings, Increase emotional regulation, Facilitate acceptance of mental health diagnosis and concerns, and Increase skills for wellness and recovery  Therapeutic Interventions: Assess for all discharge needs, 1 to 1 time with Social worker, Explore available resources and support systems, Assess for adequacy in community support network, Educate family and significant other(s) on suicide prevention, Complete Psychosocial Assessment, Interpersonal group therapy.  Evaluation of Outcomes: Progressing   Progress in Treatment: Attending groups: Yes. Participating in groups: Yes. Taking medication as prescribed: Yes. Toleration medication: Yes. Family/Significant other contact made: No, will  contact:  once contact is made Patient understands diagnosis: Yes. Discussing patient identified problems/goals with staff: Yes. Medical problems stabilized or resolved: Yes. Denies suicidal/homicidal ideation: Yes. Issues/concerns per patient self-inventory: No. Other: None   New problem(s) identified: No, Describe:  None  Update 07/22/2023: No changes at this time.    New Short Term/Long Term Goal(s):detox, elimination of symptoms of psychosis, medication management for mood stabilization; elimination of SI thoughts; development of comprehensive mental wellness/sobriety plan.   Update 07/22/2023: No changes at this time.    Patient Goals:  Patient reports that she will work to maintain daily grooming, taking medications and attending group.   Update 07/22/2023: No changes at this time.    Discharge Plan or Barriers: CSW to assist in the development of appropriate discharge plan.  Update 07/22/2023: The patient is scheduled to discharge 07/22/23    Reason for Continuation of Hospitalization: Anxiety Delusions  Depression Suicidal ideation   Estimated Length of Stay: 1-7 days.  Update 07/22/2023: The patient is scheduled to discharge 07/22/23   Last 3 [place] Suicide Severity Risk Score: Flowsheet Row Admission (Current) from 07/17/2023 in [facility] INPATIENT BEH[facility]AL MEDICINE ED from 07/16/2023 in [facility] Emergency Department at [facility] Admission (Discharged) from 05/22/2023 in [facility] INPATIENT BEH[facility]IORAL MEDICINE  C-SSRS RISK CATEGORY No Risk No Risk No Risk       Last PHQ 2/9 Scores:    07/16/2023   11:42 PM  Depression screen PHQ 2/9  Decreased Interest 1  Down, Depressed, Hopeless 1  PHQ - 2 Score 2  Altered sleeping 1  Tired, decreased energy 1  Change in appetite 1  Feeling bad or failure about yourself  1  Trouble concentrating 1  Moving slowly or fidgety/restless 1  Suicidal thoughts 0  PHQ-9 Score 8    Scribe for Treatment Team: Marshell Levan,  LCSW 07/22/2023 9:50 AM
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
## 2023-07-22 NOTE — Progress Notes (Addendum)
  [facility] Adult Case Management Discharge Plan :  Will you be returning to the same living situation after discharge:  Yes,  [address]   [place]  At discharge, do you have transportation home?: Yes,  The patient stated that she will contact someone to pick her up.  Do you have the ability to pay for your medications: Yes,  The patient stated yes.  Release of information consent forms completed and in the chart;  Patient's signature needed at discharge.  Patient to Follow up at:  Follow-up Information     Inc, [employer]. Go to.   Why: Following your discharge, you must complete a reassessment during walk in hours 8 AM- 3:30 PM Monday-Friday. Contact information: [address] [place] [address] [phone]                 Next level of care provider has access to [facility] Link:yes  Safety Planning and Suicide Prevention discussed: Yes,  SPI pamphlet provided to pt and pt was encouraged to share information with support network.      Has patient been referred to the Quitline?: Patient refused referral for treatment  Patient has been referred for addiction treatment: Yes, the patient will follow up with an outpatient provider for substance use disorder. Therapist: patient to schedule appointment  Marshell Levan, LCSW 07/22/2023, 9:46 AM

## 2023-07-22 NOTE — BHH Suicide Risk Assessment (Addendum)
 [facility] Discharge Suicide Risk Assessment   Principal Problem: Severe bipolar I disorder, current or most recent episode depressed (HCC) Discharge Diagnoses: Principal Problem:   Severe bipolar I disorder, current or most recent episode depressed (HCC)   Total Time spent with patient: 2 hours  Musculoskeletal: Strength & Muscle Tone: within normal limits Gait & Station: normal Patient leans: N/A  Psychiatric Specialty Exam  Presentation  General Appearance:  Appropriate for Environment; Fairly Groomed Recruitment consultant, engaged in conversation, interacting appropriately with staff and peers.)  Eye Contact: Good  Speech: Clear and Coherent; Normal Rate  Speech Volume: Normal  Handedness: Right   Mood and Affect  Mood: Euthymic (Reports feeling "better" and ready for discharge.)  Duration of Depression Symptoms: Greater than two weeks  Affect: Flat   Thought Process  Thought Processes: Goal Directed  Descriptions of Associations:Intact  Orientation:Full (Time, Place and Person) (and situation)  Thought Content:Logical  History of Schizophrenia/Schizoaffective disorder:Yes  Duration of Psychotic Symptoms:Greater than six months (resolved at this time)  Hallucinations:Hallucinations: None Description of Auditory Hallucinations: denies  Ideas of Reference:None  Suicidal Thoughts:Suicidal Thoughts: No  Homicidal Thoughts:Homicidal Thoughts: No   Sensorium  Memory: Immediate Good; Recent Good; Remote Good  Judgment: Fair (observed in discussion of discharge plan.Understands the importance of follow-up appointments and medication regimen.)  Insight: Fair (Demonstrates improved insight into mental health condition. Acknowledges need for continued treatment and medication adherence.)   Executive Functions  Concentration: Fair  Attention Span: Fair  Recall: Good  Fund of Knowledge: Good  Language: Good   Psychomotor Activity  Psychomotor  Activity: Psychomotor Activity: Normal   Assets  Assets: Communication Skills; Desire for Improvement; Resilience; Social Support; Housing   Sleep  Sleep: Sleep: Good Number of Hours of Sleep: 7   Physical Exam: Physical Exam Vitals and nursing note reviewed.  Constitutional:      Appearance: Normal appearance.  HENT:     Head: Normocephalic and atraumatic.     Nose: Nose normal.  Pulmonary:     Effort: Pulmonary effort is normal.  Musculoskeletal:        General: Normal range of motion.     Cervical back: Normal range of motion.  Neurological:     General: No focal deficit present.     Mental Status: She is alert and oriented to person, place, and time. Mental status is at baseline.  Psychiatric:        Attention and Perception: Attention and perception normal.        Mood and Affect: Mood and affect normal.        Speech: Speech normal.        Behavior: Behavior normal. Behavior is cooperative.        Thought Content: Thought content normal.        Cognition and Memory: Cognition and memory normal.        Judgment: Judgment is impulsive.    Review of Systems  All other systems reviewed and are negative.  Blood pressure 111/75, pulse 78, temperature (!) 97.2 F (36.2 C), resp. rate 20, height 5\' 3"  (1.6 m), weight 68 kg, SpO2 97%. Body mass index is 26.57 kg/m.  Mental Status Per Nursing Assessment::   On Admission:  NA  Demographic Factors:  Divorced or widowed, Low socioeconomic status, and Unemployed  Loss Factors: Loss of significant relationship and Financial problems/change in socioeconomic status  Historical Factors: Family history of mental illness or substance abuse  Risk Reduction Factors:   Sense of responsibility to family,  Religious beliefs about death, Living with another person, especially a relative, Positive social support, and Positive therapeutic relationship  Continued Clinical Symptoms:  Bipolar Disorder:   Depressive  phase Schizophrenia:   Depressive state Paranoid or undifferentiated type Medical Diagnoses and Treatments/Surgeries  Cognitive Features That Contribute To Risk:  None    Suicide Risk:  Minimal: No identifiable suicidal ideation.  Patients presenting with no risk factors but with morbid ruminations; may be classified as minimal risk based on the severity of the depressive symptoms   Follow-up Information     [facility], [facility]. Go to.   Why: Following your discharge, you must complete a reassessment during walk in hours 8 AM- 3:30 PM Monday-Friday. Contact information: [address] [place] [address] [phone]                 Plan Of Care/Follow-up recommendations:  Activity:  as tolerated Diet:  heart healthy Prozac (Fluoxetine) to 60 mg daily to target depressive symptoms and anxiety. Lithium 300 mg BID - Continue; monitor serum levels to ensure a therapeutic range with outpatient provider) Melatonin 5 mg HS to support sleep regulation. Lorazepam (Ativan) 1 mg by mouth BID as first-line treatment for catatonia ,monitor for response. Paliperidone ER (Invega ER) 3 mg PO daily for the next 7 days to maintain stable plasma levels. Increase to Paliperidone ER 6 mg PO daily after 7 days if needed Hydroxyzine 25 mg PO PRN for anxiety Metformin 500 mg BID for diabetes management Trazodone 50 mg HS for sleep support and depression Hydroxyzine 25 mg PO PRN for anxiety PCP appointment scheduled for diabetes management and routine medical evaluation in 7 days Encourage Invega Sustenna 156 mg IM (administered Day 1) to ensure symptom control. Encouraged initiation of long-acting injectable (LAI) Invega - patient refused, stating, "I don't want a shot." Will refer to outpatient follow-up Referral for outpatient psychiatric follow-up post-discharge to reevaluate the need for LAI Invega in 7 days National Suicide & Crisis Lifeline: ?? 988 or 1-800-273-TALK  4302297435) - Free, confidential support. Crisis Text Line: ?? Text HELLO to (928) [phone] - Connect with a trained crisis counseling Myriam Forehand, NP  07/22/2023, 6:47 AM

## 2023-07-22 NOTE — Progress Notes (Signed)
 Discharge note: RN met with patient and reviewed patient's discharge instructions. Patient verbalized understanding of discharge instructions and did not have any questions. RN reviewed and provided patient with a copy of SRA, AVS and Transition Record (English/Spanish). RN returned patient's belongings to patient. Prescriptions and discharge medications given to patient. Patient denied SI/HI/AVH and voiced no concerns. Patient was appreciative of the care received. Patient discharged to the lobby without incident.

## 2023-08-02 ENCOUNTER — Emergency Department

## 2023-08-02 ENCOUNTER — Other Ambulatory Visit: Payer: Self-pay

## 2023-08-02 ENCOUNTER — Emergency Department (EMERGENCY_DEPARTMENT_HOSPITAL)
Admission: EM | Admit: 2023-08-02 | Discharge: 2023-08-03 | Disposition: A | Discharge status: Other Acute Inpatient Hospital | Source: Home / Self Care | Attending: Emergency Medicine | Admitting: Emergency Medicine

## 2023-08-02 DIAGNOSIS — Z7984 Long term (current) use of oral hypoglycemic drugs: Secondary | ICD-10-CM | POA: Insufficient documentation

## 2023-08-02 DIAGNOSIS — F202 Catatonic schizophrenia: Secondary | ICD-10-CM | POA: Insufficient documentation

## 2023-08-02 DIAGNOSIS — F17210 Nicotine dependence, cigarettes, uncomplicated: Secondary | ICD-10-CM | POA: Insufficient documentation

## 2023-08-02 DIAGNOSIS — K5900 Constipation, unspecified: Secondary | ICD-10-CM | POA: Insufficient documentation

## 2023-08-02 DIAGNOSIS — I10 Essential (primary) hypertension: Secondary | ICD-10-CM | POA: Insufficient documentation

## 2023-08-02 LAB — CBC
HCT: 45.5 % (ref 36.0–46.0)
Hemoglobin: 15.7 g/dL — ABNORMAL HIGH (ref 12.0–15.0)
MCH: 32.6 pg (ref 26.0–34.0)
MCHC: 34.5 g/dL (ref 30.0–36.0)
MCV: 94.6 fL (ref 80.0–100.0)
Platelets: 370 10*3/uL (ref 150–400)
RBC: 4.81 MIL/uL (ref 3.87–5.11)
RDW: 11.8 % (ref 11.5–15.5)
WBC: 9.9 10*3/uL (ref 4.0–10.5)
nRBC: 0 % (ref 0.0–0.2)

## 2023-08-02 LAB — COMPREHENSIVE METABOLIC PANEL
ALT: 18 U/L (ref 0–44)
AST: 22 U/L (ref 15–41)
Albumin: 3.8 g/dL (ref 3.5–5.0)
Alkaline Phosphatase: 61 U/L (ref 38–126)
Anion gap: 11 (ref 5–15)
BUN: 17 mg/dL (ref 6–20)
CO2: 21 mmol/L — ABNORMAL LOW (ref 22–32)
Calcium: 9 mg/dL (ref 8.9–10.3)
Chloride: 106 mmol/L (ref 98–111)
Creatinine, Ser: 0.52 mg/dL (ref 0.44–1.00)
GFR, Estimated: >60 mL/min (ref >60)
Glucose, Bld: 96 mg/dL (ref 70–99)
Potassium: 3.5 mmol/L (ref 3.5–5.1)
Sodium: 138 mmol/L (ref 135–145)
Total Bilirubin: 0.8 mg/dL (ref 0.0–1.2)
Total Protein: 7.2 g/dL (ref 6.5–8.1)

## 2023-08-02 LAB — URINALYSIS, ROUTINE W REFLEX MICROSCOPIC
Bilirubin Urine: NEGATIVE
Glucose, UA: NEGATIVE mg/dL
Hgb urine dipstick: NEGATIVE
Ketones, ur: 20 mg/dL — AB
Leukocytes,Ua: NEGATIVE
Nitrite: NEGATIVE
Protein, ur: 30 mg/dL — AB
Specific Gravity, Urine: 1.026 (ref 1.005–1.030)
pH: 5 (ref 5.0–8.0)

## 2023-08-02 LAB — LIPASE, BLOOD: Lipase: 33 U/L (ref 11–51)

## 2023-08-02 LAB — POC URINE PREG, ED: Preg Test, Ur: NEGATIVE

## 2023-08-02 NOTE — ED Triage Notes (Addendum)
 Pt bib sister with complaints of abd pain for 2 weeks. Sister says she hasn't been eating or drinking and hasn't had BM in 2 weeks. Pt has no facial expression during triage but will answer questions. PT has hx of depression and schizophrenia and sister says pt has not been taking her medication. She took medicine for depression yesterday but has not taken anything today.

## 2023-08-03 ENCOUNTER — Inpatient Hospital Stay
Admission: AD | Admit: 2023-08-03 | Discharge: 2023-08-17 | DRG: 885 | Disposition: A | Discharge status: Home / Self Care | Source: Intra-hospital | Attending: Psychiatry | Admitting: Psychiatry

## 2023-08-03 ENCOUNTER — Encounter: Payer: Self-pay | Admitting: Psychiatry

## 2023-08-03 DIAGNOSIS — Z560 Unemployment, unspecified: Secondary | ICD-10-CM | POA: Diagnosis not present

## 2023-08-03 DIAGNOSIS — F314 Bipolar disorder, current episode depressed, severe, without psychotic features: Principal | ICD-10-CM | POA: Diagnosis present

## 2023-08-03 DIAGNOSIS — Z79899 Other long term (current) drug therapy: Secondary | ICD-10-CM

## 2023-08-03 DIAGNOSIS — E559 Vitamin D deficiency, unspecified: Secondary | ICD-10-CM | POA: Diagnosis present

## 2023-08-03 DIAGNOSIS — F940 Selective mutism: Secondary | ICD-10-CM | POA: Diagnosis present

## 2023-08-03 DIAGNOSIS — F315 Bipolar disorder, current episode depressed, severe, with psychotic features: Principal | ICD-10-CM | POA: Diagnosis present

## 2023-08-03 DIAGNOSIS — Z91148 Patient's other noncompliance with medication regimen for other reason: Secondary | ICD-10-CM | POA: Diagnosis not present

## 2023-08-03 DIAGNOSIS — F17210 Nicotine dependence, cigarettes, uncomplicated: Secondary | ICD-10-CM | POA: Diagnosis present

## 2023-08-03 DIAGNOSIS — E119 Type 2 diabetes mellitus without complications: Secondary | ICD-10-CM | POA: Diagnosis present

## 2023-08-03 DIAGNOSIS — Z7984 Long term (current) use of oral hypoglycemic drugs: Secondary | ICD-10-CM | POA: Diagnosis not present

## 2023-08-03 DIAGNOSIS — Z833 Family history of diabetes mellitus: Secondary | ICD-10-CM | POA: Diagnosis not present

## 2023-08-03 DIAGNOSIS — I10 Essential (primary) hypertension: Secondary | ICD-10-CM | POA: Diagnosis present

## 2023-08-03 DIAGNOSIS — F419 Anxiety disorder, unspecified: Secondary | ICD-10-CM | POA: Diagnosis present

## 2023-08-03 DIAGNOSIS — Z555 Less than a high school diploma: Secondary | ICD-10-CM | POA: Diagnosis not present

## 2023-08-03 DIAGNOSIS — Z604 Social exclusion and rejection: Secondary | ICD-10-CM | POA: Diagnosis present

## 2023-08-03 DIAGNOSIS — R630 Anorexia: Secondary | ICD-10-CM | POA: Diagnosis present

## 2023-08-03 DIAGNOSIS — K5900 Constipation, unspecified: Secondary | ICD-10-CM | POA: Diagnosis present

## 2023-08-03 DIAGNOSIS — Z79890 Hormone replacement therapy (postmenopausal): Secondary | ICD-10-CM | POA: Diagnosis not present

## 2023-08-03 DIAGNOSIS — R42 Dizziness and giddiness: Secondary | ICD-10-CM | POA: Diagnosis present

## 2023-08-03 LAB — LITHIUM LEVEL: Lithium Lvl: <0.06 mmol/L — ABNORMAL LOW (ref 0.60–1.20)

## 2023-08-03 LAB — URINE DRUG SCREEN, QUALITATIVE (ARMC ONLY)
Amphetamines, Ur Screen: NOT DETECTED
Barbiturates, Ur Screen: NOT DETECTED
Benzodiazepine, Ur Scrn: NOT DETECTED
Cannabinoid 50 Ng, Ur ~~LOC~~: NOT DETECTED
Cocaine Metabolite,Ur ~~LOC~~: NOT DETECTED
MDMA (Ecstasy)Ur Screen: NOT DETECTED
Methadone Scn, Ur: NOT DETECTED
Opiate, Ur Screen: NOT DETECTED
Phencyclidine (PCP) Ur S: NOT DETECTED
Tricyclic, Ur Screen: NOT DETECTED

## 2023-08-03 LAB — URINE CULTURE: Culture: NO GROWTH

## 2023-08-03 LAB — ETHANOL: Alcohol, Ethyl (B): <10 mg/dL (ref <10)

## 2023-08-03 MED ORDER — HALOPERIDOL LACTATE 5 MG/ML IJ SOLN: 10.0000 mg | Freq: Three times a day (TID) | INTRAMUSCULAR | Status: DC | PRN

## 2023-08-03 MED ORDER — MAGNESIUM HYDROXIDE 400 MG/5ML PO SUSP: 30.0000 mL | Freq: Every day | ORAL | Status: DC | PRN

## 2023-08-03 MED ORDER — LORAZEPAM 0.5 MG PO TABS: 0.5000 mg | ORAL_TABLET | Freq: Every day | ORAL | Status: DC

## 2023-08-03 MED ORDER — FLUOXETINE HCL 20 MG PO CAPS
60.0000 mg | ORAL_CAPSULE | Freq: Every day | ORAL | Status: DC
  Administered 2023-08-03: 60 mg via ORAL
  Filled 2023-08-03: qty 3

## 2023-08-03 MED ORDER — ACETAMINOPHEN 325 MG PO TABS
650.0000 mg | ORAL_TABLET | Freq: Four times a day (QID) | ORAL | Status: DC | PRN
Start: 2023-08-03 — End: 2023-08-17
  Administered 2023-08-03 – 2023-08-08 (×3): 650 mg via ORAL
  Filled 2023-08-03 (×3): qty 2

## 2023-08-03 MED ORDER — ATORVASTATIN CALCIUM 20 MG PO TABS
20.0000 mg | ORAL_TABLET | Freq: Every day | ORAL | Status: DC
  Administered 2023-08-03: 20 mg via ORAL
  Filled 2023-08-03: qty 1

## 2023-08-03 MED ORDER — DIPHENHYDRAMINE HCL 50 MG/ML IJ SOLN: 50.0000 mg | Freq: Three times a day (TID) | INTRAMUSCULAR | Status: DC | PRN

## 2023-08-03 MED ORDER — DIPHENHYDRAMINE HCL 50 MG/ML IJ SOLN
50.0000 mg | Freq: Three times a day (TID) | INTRAMUSCULAR | Status: DC | PRN
Start: 2023-08-03 — End: 2023-08-17

## 2023-08-03 MED ORDER — HYDROXYZINE HCL 25 MG PO TABS: 25.0000 mg | ORAL_TABLET | Freq: Three times a day (TID) | ORAL | Status: DC | PRN

## 2023-08-03 MED ORDER — HALOPERIDOL 5 MG PO TABS: 5.0000 mg | ORAL_TABLET | Freq: Three times a day (TID) | ORAL | Status: DC | PRN

## 2023-08-03 MED ORDER — TRAZODONE HCL 50 MG PO TABS: 50.0000 mg | ORAL_TABLET | Freq: Every evening | ORAL | Status: DC | PRN

## 2023-08-03 MED ORDER — PALIPERIDONE ER 3 MG PO TB24
3.0000 mg | ORAL_TABLET | Freq: Every day | ORAL | Status: DC
  Administered 2023-08-03: 3 mg via ORAL
  Filled 2023-08-03: qty 1

## 2023-08-03 MED ORDER — LORAZEPAM 2 MG/ML IJ SOLN: 2.0000 mg | Freq: Three times a day (TID) | INTRAMUSCULAR | Status: DC | PRN

## 2023-08-03 MED ORDER — DOCUSATE SODIUM 100 MG PO CAPS
100.0000 mg | ORAL_CAPSULE | Freq: Two times a day (BID) | ORAL | Status: DC
  Administered 2023-08-03 (×2): 100 mg via ORAL
  Filled 2023-08-03 (×2): qty 1

## 2023-08-03 MED ORDER — ALUM & MAG HYDROXIDE-SIMETH 200-200-20 MG/5ML PO SUSP: 30.0000 mL | ORAL | Status: DC | PRN

## 2023-08-03 MED ORDER — LORAZEPAM 1 MG PO TABS
1.0000 mg | ORAL_TABLET | Freq: Three times a day (TID) | ORAL | Status: DC
  Administered 2023-08-03: 1 mg via ORAL
  Filled 2023-08-03: qty 1

## 2023-08-03 MED ORDER — HALOPERIDOL LACTATE 5 MG/ML IJ SOLN: 5.0000 mg | Freq: Three times a day (TID) | INTRAMUSCULAR | Status: DC | PRN

## 2023-08-03 MED ORDER — LORAZEPAM 2 MG PO TABS
2.0000 mg | ORAL_TABLET | Freq: Once | ORAL | Status: AC
  Administered 2023-08-03: 2 mg via ORAL
  Filled 2023-08-03: qty 1

## 2023-08-03 MED ORDER — MELATONIN 5 MG PO TABS
5.0000 mg | ORAL_TABLET | Freq: Every day | ORAL | Status: DC
  Administered 2023-08-03: 5 mg via ORAL
  Filled 2023-08-03: qty 1

## 2023-08-03 MED ORDER — METFORMIN HCL 500 MG PO TABS
500.0000 mg | ORAL_TABLET | Freq: Two times a day (BID) | ORAL | Status: DC
  Administered 2023-08-03: 500 mg via ORAL
  Filled 2023-08-03: qty 1

## 2023-08-03 MED ORDER — DIPHENHYDRAMINE HCL 25 MG PO CAPS: 50.0000 mg | ORAL_CAPSULE | Freq: Three times a day (TID) | ORAL | Status: DC | PRN

## 2023-08-03 MED ORDER — LITHIUM CARBONATE ER 300 MG PO TBCR
300.0000 mg | EXTENDED_RELEASE_TABLET | Freq: Two times a day (BID) | ORAL | Status: DC
  Administered 2023-08-03: 300 mg via ORAL
  Filled 2023-08-03: qty 1

## 2023-08-03 MED ORDER — POLYETHYLENE GLYCOL 3350 17 G PO PACK
17.0000 g | PACK | Freq: Two times a day (BID) | ORAL | Status: DC
  Administered 2023-08-03 (×2): 17 g via ORAL
  Filled 2023-08-03 (×2): qty 1

## 2023-08-03 NOTE — Consult Note (Addendum)
 [facility] [name] Telepsychiatry Consult Note  Patient Name: Isabella [name] MRN: [id number] DOB: 05-16-1976 DATE OF Consult: 08/03/2023  PRIMARY PSYCHIATRIC DIAGNOSES  1.  Bipolar disorder, current episode depressed with psychotic features 2.  Residual catatonia   RECOMMENDATIONS  Recommendations: Medication recommendations: Increase Ativan to 1mg  po TID to treat residual catatonia; Continue Prozac 60mg  po daily for mood; continue Lithium 300mg  po BID for mood; continue Invega 3mg  po daily for psychosis; continue hydroxyzine 25mg  po TID PRN anxiety; continue trazodone 50mg  po nightly PRN insomnia; Recommend Ativan 2mg  po once    Non-Medication/therapeutic recommendations: Monitor vital signs for development of malignant catatonia, including fever, autonomic instability, tachypnea, tachycardia, delirium, rigidity  Is inpatient psychiatric hospitalization recommended for this patient? Yes (Explain why): Depression, psychosis, poor self care, residual catatonia  Follow-Up Telepsychiatry C/L services: We will continue to follow this patient with you until stabilized or discharged.  If you have any questions or concerns, please call our TeleCare Coordination service at  [phone] and ask for myself or the provider on-call. Communication: Treatment team members (and family members if applicable) who were involved in treatment/care discussions and planning, and with whom we spoke or engaged with via secure text/chat, include the following: Alden Server, Dr. Dolores Frame via Epic chat  Isabella [name] is a 48 year old female with a history of bipolar disorder, schizophrenia, hypertension who is brought to the ED due to abdominal pain and constipation and concern by her sister for psychiatric decompensation in the setting of medication non-adherence, with patient reportedly not eating, drinking with a history of catatonia and recent psychiatric hospitalization for similar 3/3-07/22/23. Patient prescribed Lithium at  discharge from psych hospital, current level 0.06. Psychiatry consulted for diagnostic clarification and disposition recommendations. On evaluation, patient noted to be hypophonic, with limited verbal responses but responding appropriately to questions with more than yes/no answers, linear to direct questioning, flat affect, not appearing internally preoccupied, not responding to internal stimuli, alert and oriented x 4. Patient endorses depressed mood, fatigue, insomnia, decreased appetite. Denies suicidal ideation, intent, plan. Endorses paranoia. Denies auditory and visual hallucinations, homicidal ideation, symptoms consistent with mania/hypomania. Able to complete part of the Bush Francis Catatonic Rating Scale and patient scored a 5 indicating possible residual catatonia and given unable to complete entire scale, possible patient would score higher. Patient's presentation concerning for bipolar disorder, current episode depressed with psychotic features vs schizoaffective disorder bipolar type complicated by residual catatonia vs negative symptoms of thought disorder vs depression, though given history of catatonia and recent treatment for catatonia with discontinuation of psych meds highly suspect residual catatonia. Patient denies suicidal ideation, intent, plan. However, patient with poor self care at home, minimally engagement with the world, limited PO intake, depressed mood, paranoia and with concern for decompensation in the setting of medication non-adherence. Therefore, inpatient psychiatric hospitalization is recommended. Typically, when someone has catatonia it is recommended to discontinue anti-psychotic medication due to the risk of NMS. However, as patient's catatonia appears to be less pronounced than during her recent hospitalization and residual, recommend increasing Ativan to treat catatonia and restarting anti-psychotic medication to address psychosis.     Thank you for involving [place] in  the care of this patient. If you have any additional questions or concerns, please call [phone] and ask for me or the provider on-call.  TELEPSYCHIATRY ATTESTATION & CONSENT  As the provider for this telehealth consult, I attest that I verified the patient's identity using two separate identifiers, introduced myself to the patient, provided my credentials,  disclosed my location, and performed this encounter via a HIPAA-compliant, real-time, face-to-face, two-way, interactive audio and video platform and with the full consent and agreement of the patient (or guardian as applicable.)  Patient physical location: ED in [facility]  Telehealth provider physical location: home office in state of [place]   Video start time: 0407 AM EST Video end time: 0416 AM EST  IDENTIFYING DATA  Isabella [name] is a 48 y.o. year-old female for whom a psychiatric consultation has been ordered by the primary provider. The patient was identified using two separate identifiers.  CHIEF COMPLAINT/REASON FOR CONSULT  Abdominal pain and constipation    HISTORY OF PRESENT ILLNESS (HPI)  [name] is a 48 year old female with a history of bipolar disorder, schizophrenia, hypertension who is brought to the ED due to abdominal pain and constipation and concern by her sister for psychiatric decompensation in the setting of medication non-adherence, with patient reportedly not eating, drinking or talking with a history of catatonia and recent psychiatric hospitalization for similar 3/3-07/22/23. Patient prescribed Lithium at discharge from psych hospital, current level 0.06. Psychiatry consulted for diagnostic clarification and disposition recommendations.  Evaluated patient using Spanish interpreter. On evaluation, patient noted to be hypophonic, with limited verbal responses but responding appropriately to questions with more than yes/no answers, linear to direct questioning, flat affect, not appearing  internally preoccupied, not responding to internal stimuli, alert and oriented x 4. Patient states she is in the ED because, "I had a relapse." Reports she had a relapse of bipolar disorder. When inquire what lets her know that she is not doing well, patient reports she feels tired. Patient reports she has not been sleeping much at night, does not know how many hours. Patient reports she has been eating and drinking at home. States she has been eating less than usual. Patient reports she has been bathing every other day. Endorses vague paranoia about being watched, unable to elaborate. Denies symptoms consistent with mania/hypomania. Patient reports she has been feeling depressed, endorses anhedonia. Denies other depressive symptoms. Patient denies suicidal ideation, intent, plan. Denies auditory and visual hallucinations, denies homicidal ideation. Patient reports she has not been taking her medication daily, does not know how often she takes it.   Unable to complete entirety of Loni Beckwith Catatonia Rating Scale, elements completed:  Excitement: 0 Immobility/stupor: +1 Mutism: 0 Staring: +1 Posturing/catalepsy: 0 Grimacing: 0 Echopraxia/echolalia: 0 Stereotypy: 0 Mannerisms: 0 Verbigeration: 0 Withdrawal: +3 Impulsivity: 0    PAST PSYCHIATRIC HISTORY  Per chart review multiple prior psychiatric hospitalizations Per chart review: Abilify, risperidone, Invega, Lithium, Ativan  Unable to further evaluate history due to patient condition   Otherwise as per HPI above.  PAST MEDICAL HISTORY  Past Medical History:  Diagnosis Date   Bipolar disorder (HCC)    Hypertension    Migraine 07/13/2016   Schizophrenia [facility])      HOME MEDICATIONS  Facility Ordered Medications  Medication   polyethylene glycol (MIRALAX / GLYCOLAX) packet 17 g   docusate sodium (COLACE) capsule 100 mg   atorvastatin (LIPITOR) tablet 20 mg   FLUoxetine (PROZAC) capsule 60 mg   hydrOXYzine (ATARAX) tablet 25 mg    lithium carbonate (LITHOBID) ER tablet 300 mg   LORazepam (ATIVAN) tablet 0.5 mg   melatonin tablet 5 mg   metFORMIN (GLUCOPHAGE) tablet 500 mg   paliperidone (INVEGA) 24 hr tablet 3 mg   traZODone (DESYREL) tablet 50 mg   PTA Medications  Medication Sig   FLUoxetine (  PROZAC) 20 MG capsule Take 3 capsules (60 mg total) by mouth daily.   lithium carbonate (LITHOBID) 300 MG ER tablet Take 1 tablet (300 mg total) by mouth every 12 (twelve) hours.   melatonin 5 MG TABS Take 1 tablet (5 mg total) by mouth at bedtime.   atorvastatin (LIPITOR) 20 MG tablet Take 1 tablet (20 mg total) by mouth daily.   traZODone (DESYREL) 50 MG tablet Take 1 tablet (50 mg total) by mouth at bedtime as needed for sleep.   metFORMIN (GLUCOPHAGE) 500 MG tablet Take 1 tablet (500 mg total) by mouth 2 (two) times daily with a meal.   hydrOXYzine (ATARAX) 25 MG tablet Take 1 tablet (25 mg total) by mouth 3 (three) times daily as needed for anxiety.   paliperidone (INVEGA) 3 MG 24 hr tablet Take 1 tablet (3 mg total) by mouth daily.   LORazepam (ATIVAN) 0.5 MG tablet Take 1 tablet (0.5 mg total) by mouth daily.     ALLERGIES  No Known Allergies  SOCIAL & SUBSTANCE USE HISTORY  Social History   Socioeconomic History   Marital status: Single    Spouse name: Not on file   Number of children: Not on file   Years of education: Not on file   Highest education level: Not on file  Occupational History   Not on file  Tobacco Use   Smoking status: Some Days    Current packs/day: 1.00    Average packs/day: 1 pack/day for 35.2 years (35.2 ttl pk-yrs)    Types: Cigarettes    Start date: 35   Smokeless tobacco: Never  Vaping Use   Vaping status: Never Used  Substance and Sexual Activity   Alcohol use: Yes   Drug use: No   Sexual activity: Never  Other Topics Concern   Not on file  Social History Narrative   Not on file   Social Drivers of Health   Financial Resource Strain: Not on file  Food Insecurity: No  Food Insecurity (07/17/2023)   Hunger Vital Sign    Worried About Running Out of Food in the Last Year: Never true    Ran Out of Food in the Last Year: Never true  Transportation Needs: No Transportation Needs (07/17/2023)   PRAPARE - Administrator, Civil Service (Medical): No    Lack of Transportation (Non-Medical): No  Physical Activity: Not on file  Stress: Not on file  Social Connections: Socially Isolated (07/17/2023)   Social Connection and Isolation Panel [NHANES]    Frequency of Communication with Friends and Family: Never    Frequency of Social Gatherings with Friends and Family: Never    Attends Religious Services: Never    Database administrator or Organizations: No    Attends Engineer, structural: Never    Marital Status: Married   Social History   Tobacco Use  Smoking Status Some Days   Current packs/day: 1.00   Average packs/day: 1 pack/day for 35.2 years (35.2 ttl pk-yrs)   Types: Cigarettes   Start date: 1990  Smokeless Tobacco Never   Social History   Substance and Sexual Activity  Alcohol Use Yes   Social History   Substance and Sexual Activity  Drug Use No      FAMILY HISTORY  Family History  Problem Relation Age of Onset   Diabetes Mellitus II Mother        With ESRD   Mental illness Neg Hx    Breast cancer  Neg Hx      MENTAL STATUS EXAM (MSE)  Mental Status Exam: General Appearance: Casual  Orientation:  Full (Time, Place, and Person)  Memory:  Recent;   Fair  Concentration:  Concentration: Fair  Recall:  Fair  Attention  Fair  Eye Contact:  Minimal  Speech:   Soft , minimal responses   Language:   Unable to assess due to use of interpreter  Volume:  Decreased  Mood: "Sad"  Affect:  Flat  Thought Process:  Linear to direct questioning  Thought Content:  Paranoid Ideation  Suicidal Thoughts:  No  Homicidal Thoughts:  No  Judgement:  Poor  Insight:  Fair  Psychomotor Activity:  Decreased  Akathisia:  Negative   Fund of Knowledge:  Fair    Assets:  Social Support  Cognition:  WNL  ADL's:  Intact  AIMS (if indicated):       VITALS  Blood pressure (!) 143/103, pulse 91, temperature 98.7 F (37.1 C), temperature source Oral, resp. rate 20, height 5\' 3"  (1.6 m), weight 68 kg, last menstrual period 07/05/2023, SpO2 97%.  LABS  Admission on 08/02/2023  Component Date Value Ref Range Status   Lipase 08/02/2023 33  11 - 51 U/L Final   Performed at [facility], [address] [place], [place] [address]   Sodium 08/02/2023 138  135 - 145 mmol/L Final   Potassium 08/02/2023 3.5  3.5 - 5.1 mmol/L Final   Chloride 08/02/2023 106  98 - 111 mmol/L Final   CO2 08/02/2023 21 (L)  22 - 32 mmol/L Final   Glucose, Bld 08/02/2023 96  70 - 99 mg/dL Final   Glucose reference range applies only to samples taken after fasting for at least 8 hours.   BUN 08/02/2023 17  6 - 20 mg/dL Final   Creatinine, Ser 08/02/2023 0.52  0.44 - 1.00 mg/dL Final   Calcium 96/29/5284 9.0  8.9 - 10.3 mg/dL Final   Total Protein 13/24/4010 7.2  6.5 - 8.1 g/dL Final   Albumin 27/25/3664 3.8  3.5 - 5.0 g/dL Final   AST 40/34/7425 22  15 - 41 U/L Final   ALT 08/02/2023 18  0 - 44 U/L Final   Alkaline Phosphatase 08/02/2023 61  38 - 126 U/L Final   Total Bilirubin 08/02/2023 0.8  0.0 - 1.2 mg/dL Final   GFR, Estimated 08/02/2023 >60  >60 mL/min Final   Comment: (NOTE) Calculated using the CKD-EPI Creatinine Equation (2021)    Anion gap 08/02/2023 11  5 - 15 Final   Performed at [facility], [address] [place], [place] [address]   WBC 08/02/2023 9.9  4.0 - 10.5 K/uL Final   RBC 08/02/2023 4.81  3.87 - 5.11 MIL/uL Final   Hemoglobin 08/02/2023 15.7 (H)  12.0 - 15.0 g/dL Final   HCT 75/64/3329 45.5  36.0 - 46.0 % Final   MCV 08/02/2023 94.6  80.0 - 100.0 fL Final   MCH 08/02/2023 32.6  26.0 - 34.0 pg Final   MCHC 08/02/2023 34.5  30.0 - 36.0 g/dL Final   RDW 51/88/4166 11.8  11.5 - 15.5 % Final    Platelets 08/02/2023 370  150 - 400 K/uL Final   nRBC 08/02/2023 0.0  0.0 - 0.2 % Final   Performed at [facility], [address] [place], [place] [address]   Color, Urine 08/02/2023 AMBER (A)  YELLOW Final   BIOCHEMICALS MAY BE AFFECTED BY COLOR   APPearance 08/02/2023 CLOUDY (  A)  CLEAR Final   Specific Gravity, Urine 08/02/2023 1.026  1.005 - 1.030 Final   pH 08/02/2023 5.0  5.0 - 8.0 Final   Glucose, UA 08/02/2023 NEGATIVE  NEGATIVE mg/dL Final   Hgb urine dipstick 08/02/2023 NEGATIVE  NEGATIVE Final   Bilirubin Urine 08/02/2023 NEGATIVE  NEGATIVE Final   Ketones, ur 08/02/2023 20 (A)  NEGATIVE mg/dL Final   Protein, ur 96/08/5407 30 (A)  NEGATIVE mg/dL Final   Nitrite 81/19/1478 NEGATIVE  NEGATIVE Final   Leukocytes,Ua 08/02/2023 NEGATIVE  NEGATIVE Final   RBC / HPF 08/02/2023 6-10  0 - 5 RBC/hpf Final   WBC, UA 08/02/2023 11-20  0 - 5 WBC/hpf Final   Bacteria, UA 08/02/2023 FEW (A)  NONE SEEN Final   Squamous Epithelial / HPF 08/02/2023 6-10  0 - 5 /HPF Final   Mucus 08/02/2023 PRESENT   Final   Performed at [facility], [address] [place], [place] [address]   Preg Test, Ur 08/02/2023 Negative  Negative Final   Lithium Lvl 08/03/2023 <0.06 (L)  0.60 - 1.20 mmol/L Final   Performed at [facility], [address] [place], [place] [address]   Alcohol, Ethyl (B) 08/03/2023 <10  <10 mg/dL Final   Comment: (NOTE) Lowest detectable limit for serum alcohol is 10 mg/dL.  For medical purposes only. Performed at [facility], [address] [place], [place] [address]    Tricyclic, Ur Screen 08/02/2023 NONE DETECTED  NONE DETECTED Final   Amphetamines, Ur Screen 08/02/2023 NONE DETECTED  NONE DETECTED Final   MDMA (Ecstasy)Ur Screen 08/02/2023 NONE DETECTED  NONE DETECTED Final   Cocaine Metabolite,Ur Foot of Ten 08/02/2023 NONE DETECTED  NONE DETECTED Final   Opiate, Ur Screen 08/02/2023 NONE DETECTED  NONE DETECTED Final   Phencyclidine (PCP)  Ur S 08/02/2023 NONE DETECTED  NONE DETECTED Final   Cannabinoid 50 Ng, Ur Spade 08/02/2023 NONE DETECTED  NONE DETECTED Final   Barbiturates, Ur Screen 08/02/2023 NONE DETECTED  NONE DETECTED Final   Benzodiazepine, Ur Scrn 08/02/2023 NONE DETECTED  NONE DETECTED Final   Methadone Scn, Ur 08/02/2023 NONE DETECTED  NONE DETECTED Final   Comment: (NOTE) Tricyclics + metabolites, urine    Cutoff 1000 ng/mL Amphetamines + metabolites, urine  Cutoff 1000 ng/mL MDMA (Ecstasy), urine              Cutoff 500 ng/mL Cocaine Metabolite, urine          Cutoff 300 ng/mL Opiate + metabolites, urine        Cutoff 300 ng/mL Phencyclidine (PCP), urine         Cutoff 25 ng/mL Cannabinoid, urine                 Cutoff 50 ng/mL Barbiturates + metabolites, urine  Cutoff 200 ng/mL Benzodiazepine, urine              Cutoff 200 ng/mL Methadone, urine                   Cutoff 300 ng/mL  The urine drug screen provides only a preliminary, unconfirmed analytical test result and should not be used for non-medical purposes. Clinical consideration and professional judgment should be applied to any positive drug screen result due to possible interfering substances. A more specific alternate chemical method must be used in order to obtain a confirmed analytical result. Gas chromatography / mass spectrometry (GC/MS) is the preferred confirm  atory method. Performed at [facility], [address] [place], [place] [address]     PSYCHIATRIC REVIEW OF SYSTEMS (ROS)  ROS: Notable for the following relevant positive findings: Review of Systems  Psychiatric/Behavioral:  Positive for depression. The patient has insomnia.     Additional findings:      Musculoskeletal: No abnormal movements observed      Gait & Station: Laying/Sitting      Pain Screening: Denies      Nutrition & Dental Concerns: Decrease in food intake and/or loss of appetite  RISK FORMULATION/ASSESSMENT  Is the  patient experiencing any suicidal or homicidal ideations: No     Protective factors considered for safety management: Social support  Risk factors/concerns considered for safety management:  Depression  Is there a Astronomer plan with the patient and treatment team to minimize risk factors and promote protective factors: Yes           Explain: Psychiatric hospitalization  Is crisis care placement or psychiatric hospitalization recommended: Yes     Based on my current evaluation and risk assessment, patient is determined at this time to be at:  High risk  *RISK ASSESSMENT Risk assessment is a dynamic process; it is possible that this patient's condition, and risk level, may change. This should be re-evaluated and managed over time as appropriate. Please re-consult psychiatric consult services if additional assistance is needed in terms of risk assessment and management. If your team decides to discharge this patient, please advise the patient how to best access emergency psychiatric services, or to call 911, if their condition worsens or they feel unsafe in any way.   Adria Dill, MD Telepsychiatry Consult Services

## 2023-08-03 NOTE — ED Notes (Signed)
Pt changed into hospital scrubs;  

## 2023-08-03 NOTE — BH Assessment (Signed)
 Comprehensive Clinical Assessment (CCA) Note  08/03/2023 Isabella [name] [id number] Recommendations for Services/Supports/Treatments: Psych MD Nestor Ramp. determined pt. meets psychiatric inpatient criteria. Isabella [name] is a 48 year old, English speaking, Hispanic female with a hx of Catatonic Schizophrenia. Pt presented to [facility] ED voluntarily for a psych eval. Per triage note: Pt bib sister with complaints of abd pain for 2 weeks. Sister says she hasn't been eating or drinking and hasn't had BM in 2 weeks. Pt has no facial expression during triage but will answer questions. PT has hx of depression and schizophrenia and sister says pt has not been taking her medication. She took medicine for depression yesterday but has not taken anything today.  Upon assessment, pt endorsed feeling depressed, hopeless, and withdrawn. Pt reported that she'd began feeling bad again about 4/5 days ago. Pt denied having any recent life events and was unable to identify any specific triggers. Pt denied suicidal intent or actually wanting to die. Pt explained that she has been medication compliant and tries to take them, daily. Pt expressed a desire for treatment. The pt. had fair insight and judgement. Pt did not appear to be responding to internal or external stimuli. Pt presented with normal speech and had linear thought processes. Pt avoided eye contact and was cooperative with the assessment. Pt presented with a depressed mood; affect was flat. Pt denied current SI/HI/AV/H.   Chief Complaint:  Chief Complaint  Patient presents with   Abdominal Pain   Constipation   Visit Diagnosis: 1.  Bipolar disorder, current episode depressed with psychotic features 2.  Residual catatonia   CCA Screening, Triage and Referral (STR)  Patient Reported Information How did you hear about [place]? Self  Referral name: No data recorded Referral phone number: No data recorded  Whom do you see for routine medical problems?  No data recorded Practice/Facility Name: No data recorded Practice/Facility Phone Number: No data recorded Name of Contact: No data recorded Contact Number: No data recorded Contact Fax Number: No data recorded Prescriber Name: No data recorded Prescriber Address (if known): No data recorded  What Is the Reason for Your Visit/Call Today? Pt bib sister with complaints of abd pain for 2 weeks. Sister says she hasn't been eating or drinking and hasn't had BM in 2 weeks. Pt has no facial expression during triage but will answer questions. PT has hx of depression and schizophrenia and sister says pt has not been taking her medication. She took medicine for depression yesterday but has not taken anything today.  How Long Has This Been Causing You Problems? <Week  What Do You Feel Would Help You the Most Today? Treatment for Depression or other mood problem   Have You Recently Been in Any Inpatient Treatment (Hospital/Detox/Crisis Center/28-Day Program)? No data recorded Name/Location of Program/Hospital:No data recorded How Long Were You There? No data recorded When Were You Discharged? No data recorded  Have You Ever Received Services From [employer] Before? No data recorded Who Do You See at [facility]? No data recorded  Have You Recently Had Any Thoughts About Hurting Yourself? No  Are You Planning to Commit Suicide/Harm Yourself At This time? No   Have you Recently Had Thoughts About Hurting Someone Isabella [name]? No  Explanation: n/a   Have You Used Any Alcohol or Drugs in the Past 24 Hours? No  How Long Ago Did You Use Drugs or Alcohol? No data recorded What Did You Use and How Much? No data recorded  Do You Currently Have  a Therapist/Psychiatrist? No  Name of Therapist/Psychiatrist: None   Have You Been Recently Discharged From Any Office Practice or Programs? No  Explanation of Discharge From Practice/Program: Pt was released from jail 2 days ago     CCA Screening Triage  Referral Assessment Type of Contact: Face-to-Face  Is this Initial or Reassessment? No data recorded Date Telepsych consult ordered in [facility]:  No data recorded Time Telepsych consult ordered in [facility]:  No data recorded  Patient Reported Information Reviewed? No data recorded Patient Left Without Being Seen? No data recorded Reason for Not Completing Assessment: No data recorded  Collateral Involvement: n/a   Does Patient Have a Court Appointed Legal Guardian? No data recorded Name and Contact of Legal Guardian: No data recorded If Minor and Not Living with Parent(s), Who has Custody? n/a  Is CPS involved or ever been involved? Never  Is APS involved or ever been involved? Never   Patient Determined To Be At Risk for Harm To Self or Others Based on Review of Patient Reported Information or Presenting Complaint? No  Method: No Plan  Availability of Means: No access or NA  Intent: Vague intent or NA  Notification Required: No need or identified person  Additional Information for Danger to Others Potential: Active psychosis  Additional Comments for Danger to Others Potential: n/a  Are There Guns or Other Weapons in Your Home? No  Types of Guns/Weapons: n/a  Are These Weapons Safely Secured?                            No  Who Could Verify You Are Able To Have These Secured: n/a  Do You Have any Outstanding Charges, Pending Court Dates, Parole/Probation? n/a  Contacted To Inform of Risk of Harm To Self or Others: Other: Comment   Location of Assessment: [facility] ED   Does Patient Present under Involuntary Commitment? No  IVC Papers Initial File Date: No data recorded  [place] of Residence: [place]   Patient Currently Receiving the Following Services: Medication Management   Determination of Need: Emergent (2 hours)   Options For Referral: Inpatient Hospitalization     CCA Biopsychosocial Intake/Chief Complaint:  No data recorded Current Symptoms/Problems: No  data recorded  Patient Reported Schizophrenia/Schizoaffective Diagnosis in Past: Yes   Strengths: Pt is receptive to recieving treatment  Preferences: No data recorded Abilities: No data recorded  Type of Services Patient Feels are Needed: No data recorded  Initial Clinical Notes/Concerns: No data recorded  Mental Health Symptoms Depression:  Change in energy/activity; Fatigue; Hopelessness; Weight gain/loss; Sleep (too much or little)   Duration of Depressive symptoms: Greater than two weeks   Mania:  None   Anxiety:   None   Psychosis:  None   Duration of Psychotic symptoms: Greater than six months   Trauma:  N/A   Obsessions:  None   Compulsions:  None   Inattention:  None   Hyperactivity/Impulsivity:  None   Oppositional/Defiant Behaviors:  N/A   Emotional Irregularity:  Chronic feelings of emptiness   Other Mood/Personality Symptoms:  n/a    Mental Status Exam Appearance and self-care  Stature:  Average   Weight:  Average weight   Clothing:  Casual   Grooming:  Normal   Cosmetic use:  None   Posture/gait:  Normal   Motor activity:  Not Remarkable   Sensorium  Attention:  Normal   Concentration:  Normal   Orientation:  X5  Recall/memory:  Normal   Affect and Mood  Affect:  Flat   Mood:  Depressed   Relating  Eye contact:  Normal   Facial expression:  Responsive; Sad   Attitude toward examiner:  Cooperative   Thought and Language  Speech flow: Soft   Thought content:  Appropriate to Mood and Circumstances   Preoccupation:  None   Hallucinations:  None   Organization:  No data recorded  [employer] of Knowledge:  Average   Intelligence:  Average   Abstraction:  Functional   Judgement:  Poor   Reality Testing:  Variable   Insight:  Fair   Decision Making:  Normal   Social Functioning  Social Maturity:  Isolates   Social Judgement:  Normal   Stress  Stressors:  Illness   Coping Ability:   Exhausted   Skill Deficits:  Decision making; Self-control   Supports:  Family; Support needed     Religion: Religion/Spirituality How Might This Affect Treatment?: n/a  Leisure/Recreation: Leisure / Recreation Do You Have Hobbies?: Yes Leisure and Hobbies: "walk, reading"  Exercise/Diet: Exercise/Diet Do You Exercise?: No Have You Gained or Lost A Significant Amount of Weight in the Past Six Months?: No Do You Follow a Special Diet?: No Do You Have Any Trouble Sleeping?: No   CCA Employment/Education Employment/Work Situation: Employment / Work Situation Employment Situation: Unemployed Why is Patient on Disability: Pt denies. How Long has Patient Been on Disability: Pt denies. Patient's Job has Been Impacted by Current Illness: No Has Patient ever Been in the [employer]?: No  Education: Education Is Patient Currently Attending School?: No Did You Product manager?: No Did You Have An Individualized Education Program (IIEP): No Did You Have Any Difficulty At School?: No Patient's Education Has Been Impacted by Current Illness: No   CCA Family/Childhood History Family and Relationship History: Family history Marital status: Separated Separated, when?: "For a couple years." What types of issues is patient dealing with in the relationship?: "he separated from me and didn't talk to me" Additional relationship information: n/a Does patient have children?: Yes How many children?: 2 How is patient's relationship with their children?: "I don't talk to them everyday but it's good"  Childhood History:  Childhood History By whom was/is the patient raised?: Both parents Description of patient's current relationship with siblings: "Good" Did patient suffer any verbal/emotional/physical/sexual abuse as a child?: No Did patient suffer from severe childhood neglect?: No Has patient ever been sexually abused/assaulted/raped as an adolescent or adult?: No Was the patient ever  a victim of a crime or a disaster?: No Witnessed domestic violence?: No Has patient been affected by domestic violence as an adult?: No  Child/Adolescent Assessment:     CCA Substance Use Alcohol/Drug Use: Alcohol / Drug Use Pain Medications: See MAR Prescriptions: See MAR Over the Counter: See MAR History of alcohol / drug use?: No history of alcohol / drug abuse                         ASAM's:  Six Dimensions of Multidimensional Assessment  Dimension 1:  Acute Intoxication and/or Withdrawal Potential:      Dimension 2:  Biomedical Conditions and Complications:      Dimension 3:  Emotional, Behavioral, or Cognitive Conditions and Complications:     Dimension 4:  Readiness to Change:     Dimension 5:  Relapse, Continued use, or Continued Problem Potential:     Dimension 6:  Recovery/Living  Environment:     ASAM Severity Score:    ASAM Recommended Level of Treatment:     Substance use Disorder (SUD)    Recommendations for Services/Supports/Treatments:    DSM5 Diagnoses: Patient Active Problem List   Diagnosis Date Noted   Bipolar 1 disorder, depressed, severe (HCC) 07/17/2023   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   Bipolar 1 disorder (HCC) 05/22/2023   Depressed mood 05/21/2023   MDD (major depressive disorder), recurrent episode, moderate (HCC) 05/20/2023   Severe bipolar I disorder, current or most recent episode depressed (HCC) 05/20/2023   Sepsis (HCC) 07/23/2020   HTN (hypertension) 07/22/2020   Tobacco abuse 07/22/2020   LFTs abnormal 07/21/2020   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HCC) 07/20/2020   Decreased appetite 01/10/2018   Social anxiety disorder 08/02/2016    Patient Centered Plan: Patient is on the following Treatment Plan(s):  Depression   Referrals to Alternative Service(s): Referred to Alternative Service(s):   Place:   Date:   Time:    Referred to Alternative Service(s):   Place:   Date:   Time:    Referred to Alternative Service(s):   Place:    Date:   Time:    Referred to Alternative Service(s):   Place:   Date:   Time:      @[facility] @  [address] [place], [place]

## 2023-08-03 NOTE — ED Notes (Signed)
 Lunch tray & beverage provided to patient.

## 2023-08-03 NOTE — ED Notes (Addendum)
 [facility] laid eyes on patient while awaiting inpatient treatment. Patient is calm and pleasant.   Patient able to perform ADLs and ambulate without assistance.  Patient stated not currently employed and confirmed primary insurance provider is [place] [employer].  Patient has a positive outlook towards going to inpatient treatment today.  No additional resources needed at this time.   Isabella [name], [facility]

## 2023-08-03 NOTE — ED Notes (Addendum)
Breakfast tray provided with juice  
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
## 2023-08-03 NOTE — Discharge Instructions (Addendum)
 I recommend that you increase your water and fiber intake. If you are not able to eat foods high in fiber, you may use Benefiber or Metamucil over-the-counter. I also recommend you use MiraLAX 1-2 times a day and Colace 100 mg twice a day to help with bowel movements. These medications are over the counter.  You may use other over-the-counter medications such as Dulcolax, Fleet enemas, magnesium citrate as needed for constipation. Please note that some of these medications may cause you to have abdominal cramping which is normal. If you develop severe abdominal pain, fever (temperature of 100.4 or higher), persistent vomiting, distention of your abdomen, unable to have a bowel movement for 5 days or are not passing gas, please return to the hospital.

## 2023-08-03 NOTE — Plan of Care (Signed)
 Patient admitted to [facility] for MDD.  Recent admission was on 07/16/23 to BMU with same.  Lives at home with her parents.  Denies SI, HI, states avh but did not elaborate. Endorses anxiety 7/10 and depression 8/10.  States her goal in coming back for admission is "to by my best".  Will continue to monitor.

## 2023-08-03 NOTE — Progress Notes (Signed)
 Pt well known by this Clinical research associate from previous admissions. Pt calm and pleasant during assessment denying SI/HI/ pt endorses AVH but didn't specify any hallucination to this Clinical research associate. Pt did complain of some flank pain and PRN medication was given. Pt given education, support, and encouragement to be active in her treatment plan. Pt being monitored Q 15 minutes for safety per unit protocol, remains safe on the unit

## 2023-08-03 NOTE — ED Notes (Signed)
 VOL/meets psychiatric inpatient criteria.

## 2023-08-03 NOTE — ED Provider Notes (Signed)
-----------------------------------------   5:27 AM on 08/03/2023 -----------------------------------------   Patient seen by overnight psychiatrist who does recommend inpatient hospitalization.   Irean Hong, MD 08/03/23 [phone]

## 2023-08-03 NOTE — Group Note (Signed)
 [facility] LCSW Group Therapy Note   Group Date: 08/03/2023 Start Time: 1300 End Time: 1340   Type of Therapy/Topic:  Group Therapy:  Balance in Life  Participation Level:  Did Not Attend   Description of Group:    This group will address the concept of balance and how it feels and looks when one is unbalanced. Patients will be encouraged to process areas in their lives that are out of balance, and identify reasons for remaining unbalanced. Facilitators will guide patients utilizing problem- solving interventions to address and correct the stressor making their life unbalanced. Understanding and applying boundaries will be explored and addressed for obtaining  and maintaining a balanced life. Patients will be encouraged to explore ways to assertively make their unbalanced needs known to significant others in their lives, using other group members and facilitator for support and feedback.  Therapeutic Goals: Patient will identify two or more emotions or situations they have that consume much of in their lives. Patient will identify signs/triggers that life has become out of balance:  Patient will identify two ways to set boundaries in order to achieve balance in their lives:  Patient will demonstrate ability to communicate their needs through discussion and/or role plays  Summary of Patient Progress: Patient did not attend.     Therapeutic Modalities:   Cognitive Behavioral Therapy Solution-Focused Therapy Assertiveness Training   Glenis Smoker, LCSW

## 2023-08-03 NOTE — Group Note (Signed)
 Recreation Therapy Group Note   Group Topic:Goal Setting  Group Date: 08/03/2023 Start Time: 1000 End Time: 1055 Facilitators: Rosina Lowenstein, LRT, CTRS Location: Craft Room  Group Description: Product/process development scientist. Patients were given many different magazines, a glue stick, markers, and a piece of cardstock paper. LRT and pts discussed the importance of having goals in life. LRT and pts discussed the difference between short-term and long-term goals, as well as what a SMART goal is. LRT encouraged pts to create a vision board, with images they picked and then cut out with safety scissors from the magazine, for themselves, that capture their short and long-term goals. LRT encouraged pts to show and explain their vision board to the group.   Goal Area(s) Addressed:  Patient will gain knowledge of short vs. long term goals.  Patient will identify goals for themselves. Patient will practice setting SMART goals. Patient will verbalize their goals to LRT and peers.  Affect/Mood: N/A   Participation Level: Did not attend    Clinical Observations/Individualized Feedback: Patient did not attend group due to not being on the unit yet.   Plan: Continue to engage patient in RT group sessions 2-3x/week.   Rosina Lowenstein, LRT, CTRS 08/03/2023 1:16 PM

## 2023-08-03 NOTE — ED Notes (Signed)
 Attempted to call report to admission unit; unavailable at this time. Awaiting return call from RN.

## 2023-08-03 NOTE — Tx Team (Signed)
 Initial Treatment Plan 08/03/2023 3:00 PM Isabella [name] [facility] [id number]    PATIENT STRESSORS: Other: depression and anxiety     PATIENT STRENGTHS: Capable of independent living  Financial means  Supportive family/friends    PATIENT IDENTIFIED PROBLEMS:                      DISCHARGE CRITERIA:  Ability to meet basic life and health needs Adequate post-discharge living arrangements  PRELIMINARY DISCHARGE PLAN: Return to previous living arrangement  PATIENT/FAMILY INVOLVEMENT: This treatment plan has been presented to and reviewed with the patient, Isabella [name].  The patient and family have been given the opportunity to ask questions and make suggestions.  Velna Hatchet, RN 08/03/2023, 3:00 PM

## 2023-08-03 NOTE — ED Notes (Signed)
 Hand off given to quad RN. Pending transfer to ED 20.

## 2023-08-03 NOTE — Group Note (Signed)
 Date:  08/03/2023 Time:  9:03 PM  Group Topic/Focus:  Wrap-Up Group:   The focus of this group is to help patients review their daily goal of treatment and discuss progress on daily workbooks.    Participation Level:  Did Not Attend  Participation Quality:      Affect:      Cognitive:      Insight: None  Engagement in Group:      Modes of Intervention:      Additional Comments:    Ladeana Laplant K Eloise Mula 08/03/2023, 9:03 PM

## 2023-08-04 DIAGNOSIS — F314 Bipolar disorder, current episode depressed, severe, without psychotic features: Secondary | ICD-10-CM

## 2023-08-04 MED ORDER — ATORVASTATIN CALCIUM 20 MG PO TABS
20.0000 mg | ORAL_TABLET | Freq: Every day | ORAL | Status: DC
  Administered 2023-08-04 – 2023-08-17 (×14): 20 mg via ORAL
  Filled 2023-08-04 (×14): qty 1

## 2023-08-04 MED ORDER — FLUOXETINE HCL 20 MG PO CAPS
20.0000 mg | ORAL_CAPSULE | Freq: Every day | ORAL | Status: DC
  Administered 2023-08-04 – 2023-08-06 (×3): 20 mg via ORAL
  Filled 2023-08-04 (×3): qty 1

## 2023-08-04 MED ORDER — LORAZEPAM 0.5 MG PO TABS
0.5000 mg | ORAL_TABLET | Freq: Every day | ORAL | Status: DC
  Administered 2023-08-04 – 2023-08-13 (×10): 0.5 mg via ORAL
  Filled 2023-08-04 (×10): qty 1

## 2023-08-04 MED ORDER — MELATONIN 5 MG PO TABS
5.0000 mg | ORAL_TABLET | Freq: Every day | ORAL | Status: DC
  Administered 2023-08-04 – 2023-08-09 (×6): 5 mg via ORAL
  Filled 2023-08-04 (×6): qty 1

## 2023-08-04 MED ORDER — METFORMIN HCL 500 MG PO TABS
500.0000 mg | ORAL_TABLET | Freq: Every day | ORAL | Status: DC
  Administered 2023-08-05 – 2023-08-17 (×13): 500 mg via ORAL
  Filled 2023-08-04 (×13): qty 1

## 2023-08-04 MED ORDER — LITHIUM CARBONATE ER 300 MG PO TBCR
600.0000 mg | EXTENDED_RELEASE_TABLET | Freq: Two times a day (BID) | ORAL | Status: DC
  Administered 2023-08-04 – 2023-08-17 (×26): 600 mg via ORAL
  Filled 2023-08-04 (×26): qty 2

## 2023-08-04 MED ORDER — PALIPERIDONE ER 3 MG PO TB24
6.0000 mg | ORAL_TABLET | Freq: Every day | ORAL | Status: DC
  Administered 2023-08-04 – 2023-08-08 (×5): 6 mg via ORAL
  Filled 2023-08-04 (×6): qty 2

## 2023-08-04 MED ORDER — GLUCERNA SHAKE PO LIQD
237.0000 mL | Freq: Two times a day (BID) | ORAL | Status: DC
  Administered 2023-08-05 – 2023-08-15 (×14): 237 mL via ORAL

## 2023-08-04 NOTE — Progress Notes (Signed)
   08/04/23 1400  Psych Admission Type (Psych Patients Only)  Admission Status Voluntary  Psychosocial Assessment  Patient Complaints None  Eye Contact Fair  Facial Expression Flat  Affect Sad  Speech Unremarkable  Interaction Isolative  Motor Activity Slow  Appearance/Hygiene Unremarkable  Behavior Characteristics Cooperative  Mood Pleasant  Thought Process  Coherency WDL  Content WDL  Delusions None reported or observed  Perception WDL  Hallucination None reported or observed  Judgment WDL  Confusion None  Danger to Self  Current suicidal ideation? Denies  Agreement Not to Harm Self Yes  Description of Agreement verbal  Danger to Others  Danger to Others None reported or observed

## 2023-08-04 NOTE — Group Note (Signed)
 Date:  08/04/2023 Time:  10:29 AM  Group Topic/Focus:  Spirituality:   The focus of this group is to discuss how one's spirituality can aide in recovery.    Participation Level:  Active  Participation Quality:  Appropriate  Affect:  Appropriate  Cognitive:  Appropriate  Insight: Appropriate  Engagement in Group:  Engaged  Modes of Intervention:  Activity  Additional Comments:    Mary Sella Angeligue Bowne 08/04/2023, 10:29 AM
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
## 2023-08-04 NOTE — BH IP Treatment Plan (Signed)
 Interdisciplinary Treatment and Diagnostic Plan Update  08/04/2023 Time of Session: 10:17AM Isabella [name] MRN: [id number]  Principal Diagnosis: Bipolar disorder, curr episode depressed, severe, w/psychotic features (HCC)  Secondary Diagnoses: Principal Problem:   Bipolar disorder, curr episode depressed, severe, w/psychotic features (HCC)   Current Medications:  Current Facility-Administered Medications  Medication Dose Route Frequency Provider Last Rate Last Admin   acetaminophen (TYLENOL) tablet 650 mg  650 mg Oral Q6H PRN Onuoha, Chinwendu V, NP   650 mg at 08/03/23 2118   alum & mag hydroxide-simeth (MAALOX/MYLANTA) 200-200-20 MG/5ML suspension 30 mL  30 mL Oral Q4H PRN Onuoha, Chinwendu V, NP       haloperidol (HALDOL) tablet 5 mg  5 mg Oral TID PRN Onuoha, Chinwendu V, NP       And   diphenhydrAMINE (BENADRYL) capsule 50 mg  50 mg Oral TID PRN Onuoha, Chinwendu V, NP       haloperidol lactate (HALDOL) injection 5 mg  5 mg Intramuscular TID PRN Onuoha, Chinwendu V, NP       And   diphenhydrAMINE (BENADRYL) injection 50 mg  50 mg Intramuscular TID PRN Onuoha, Chinwendu V, NP       And   LORazepam (ATIVAN) injection 2 mg  2 mg Intramuscular TID PRN Onuoha, Chinwendu V, NP       haloperidol lactate (HALDOL) injection 10 mg  10 mg Intramuscular TID PRN Onuoha, Chinwendu V, NP       And   diphenhydrAMINE (BENADRYL) injection 50 mg  50 mg Intramuscular TID PRN Onuoha, Chinwendu V, NP       And   LORazepam (ATIVAN) injection 2 mg  2 mg Intramuscular TID PRN Onuoha, Chinwendu V, NP       magnesium hydroxide (MILK OF MAGNESIA) suspension 30 mL  30 mL Oral Daily PRN Onuoha, Chinwendu V, NP       PTA Medications: Medications Prior to Admission  Medication Sig Dispense Refill Last Dose/Taking   atorvastatin (LIPITOR) 20 MG tablet Take 1 tablet (20 mg total) by mouth daily. 30 tablet 0    FLUoxetine (PROZAC) 20 MG capsule Take 3 capsules (60 mg total) by mouth daily. 90 capsule 3     hydrOXYzine (ATARAX) 25 MG tablet Take 1 tablet (25 mg total) by mouth 3 (three) times daily as needed for anxiety. 30 tablet 0    lithium carbonate (LITHOBID) 300 MG ER tablet Take 1 tablet (300 mg total) by mouth every 12 (twelve) hours. 60 tablet 0    LORazepam (ATIVAN) 0.5 MG tablet Take 1 tablet (0.5 mg total) by mouth daily. 30 tablet 0    melatonin 5 MG TABS Take 1 tablet (5 mg total) by mouth at bedtime. 30 tablet 0    metFORMIN (GLUCOPHAGE) 500 MG tablet Take 1 tablet (500 mg total) by mouth 2 (two) times daily with a meal. 60 tablet 0    paliperidone (INVEGA) 3 MG 24 hr tablet Take 1 tablet (3 mg total) by mouth daily. 30 tablet 0    traZODone (DESYREL) 50 MG tablet Take 1 tablet (50 mg total) by mouth at bedtime as needed for sleep. 30 tablet 0     Patient Stressors: Other: depression and anxiety    Patient Strengths: Capable of independent living  Financial means  Supportive family/friends   Treatment Modalities: Medication Management, Group therapy, Case management,  1 to 1 session with clinician, Psychoeducation, Recreational therapy.   Physician Treatment Plan for Primary Diagnosis: Bipolar disorder, curr episode depressed,  severe, w/psychotic features (HCC) Long Term Goal(s):     Short Term Goals:    Medication Management: Evaluate patient's response, side effects, and tolerance of medication regimen.  Therapeutic Interventions: 1 to 1 sessions, Unit Group sessions and Medication administration.  Evaluation of Outcomes: Not Met  Physician Treatment Plan for Secondary Diagnosis: Principal Problem:   Bipolar disorder, curr episode depressed, severe, w/psychotic features (HCC)  Long Term Goal(s):     Short Term Goals:       Medication Management: Evaluate patient's response, side effects, and tolerance of medication regimen.  Therapeutic Interventions: 1 to 1 sessions, Unit Group sessions and Medication administration.  Evaluation of Outcomes: Not Met   RN  Treatment Plan for Primary Diagnosis: Bipolar disorder, curr episode depressed, severe, w/psychotic features (HCC) Long Term Goal(s): Knowledge of disease and therapeutic regimen to maintain health will improve  Short Term Goals: Ability to demonstrate self-control, Ability to participate in decision making will improve, Ability to verbalize feelings will improve, Ability to disclose and discuss suicidal ideas, Ability to identify and develop effective coping behaviors will improve, and Compliance with prescribed medications will improve  Medication Management: RN will administer medications as ordered by provider, will assess and evaluate patient's response and provide education to patient for prescribed medication. RN will report any adverse and/or side effects to prescribing provider.  Therapeutic Interventions: 1 on 1 counseling sessions, Psychoeducation, Medication administration, Evaluate responses to treatment, Monitor vital signs and CBGs as ordered, Perform/monitor CIWA, COWS, AIMS and Fall Risk screenings as ordered, Perform wound care treatments as ordered.  Evaluation of Outcomes: Not Met   LCSW Treatment Plan for Primary Diagnosis: Bipolar disorder, curr episode depressed, severe, w/psychotic features (HCC) Long Term Goal(s): Safe transition to appropriate next level of care at discharge, Engage patient in therapeutic group addressing interpersonal concerns.  Short Term Goals: Engage patient in aftercare planning with referrals and resources, Increase social support, Increase ability to appropriately verbalize feelings, Increase emotional regulation, Facilitate acceptance of mental health diagnosis and concerns, and Increase skills for wellness and recovery  Therapeutic Interventions: Assess for all discharge needs, 1 to 1 time with Social worker, Explore available resources and support systems, Assess for adequacy in community support network, Educate family and significant other(s) on  suicide prevention, Complete Psychosocial Assessment, Interpersonal group therapy.  Evaluation of Outcomes: Not Met   Progress in Treatment: Attending groups: Yes. Participating in groups: Yes. Taking medication as prescribed: Yes. Toleration medication: Yes. Family/Significant other contact made: No, will contact:  once permission has been granted. Patient understands diagnosis: Yes. Discussing patient identified problems/goals with staff: Yes. Medical problems stabilized or resolved: Yes. Denies suicidal/homicidal ideation: Yes. Issues/concerns per patient self-inventory: No. Other: none  New problem(s) identified: No, Describe:  none  New Short Term/Long Term Goal(s): detox, elimination of symptoms of psychosis, medication management for mood stabilization; elimination of SI thoughts; development of comprehensive mental wellness/sobriety plan.   Patient Goals:  "try to do my best"  Discharge Plan or Barriers: Patient reports plans to return to her home.  She reports that she has not been consistent with aftercare plans.  It is recommended that patient have an ACT team.  Reason for Continuation of Hospitalization: Anxiety Depression Medication stabilization  Estimated Length of Stay:  1-7 days  Last 3 [place] Suicide Severity Risk Score: Flowsheet Row Admission (Current) from 08/03/2023 in [facility] Admission (Discharged) from 07/17/2023 in [facility] INPATIENT BEH[facility]AL MEDICINE ED from 07/16/2023 in [facility] Emergency Department at [facility]  C-SSRS RISK CATEGORY No Risk No Risk No Risk       Last PHQ 2/9 Scores:    07/16/2023   11:42 PM  Depression screen PHQ 2/9  Decreased Interest 1  Down, Depressed, Hopeless 1  PHQ - 2 Score 2  Altered sleeping 1  Tired, decreased energy 1  Change in appetite 1  Feeling bad or failure about yourself  1  Trouble concentrating 1  Moving slowly or fidgety/restless 1  Suicidal thoughts 0  PHQ-9 Score  8    Scribe for Treatment Team: Harden Mo, LCSW 08/04/2023 1:00 PM

## 2023-08-04 NOTE — Plan of Care (Signed)
   Problem: Education: Goal: Knowledge of [place] [facility] Education information/materials will improve Outcome: Progressing Goal: Verbalization of understanding the information provided will improve Outcome: Progressing

## 2023-08-04 NOTE — Plan of Care (Signed)
   Problem: Education: Goal: Emotional status will improve Outcome: Progressing Goal: Mental status will improve Outcome: Progressing   Problem: Activity: Goal: Sleeping patterns will improve Outcome: Progressing

## 2023-08-04 NOTE — Group Note (Signed)
 Date:  08/04/2023 Time:  8:41 PM  Group Topic/Focus:  Recovery Goals:   The focus of this group is to identify appropriate goals for recovery and establish a plan to achieve them. Wrap-Up Group:   The focus of this group is to help patients review their daily goal of treatment and discuss progress on daily workbooks.    Participation Level:  Minimal  Participation Quality:  Appropriate and Attentive  Affect:  Appropriate and Flat  Cognitive:  Alert  Insight: Improving and Lacking  Engagement in Group:  Improving and Lacking  Modes of Intervention:  Discussion and Orientation  Additional Comments:     Maglione,Cyd Hostler E 08/04/2023, 8:41 PM

## 2023-08-04 NOTE — [facility] Suicide Risk Assessment (Signed)
 [facility] Admission Suicide Risk Assessment   Nursing information obtained from:  Patient Demographic factors:  NA Current Mental Status:  NA Loss Factors:  NA Historical Factors:  NA Risk Reduction Factors:  NA  Total Time spent with patient: 2.5 hours Principal Problem: Severe bipolar I disorder, current or most recent episode depressed (HCC) Diagnosis:  Principal Problem:   Severe bipolar I disorder, current or most recent episode depressed (HCC) Active Problems:   Decreased appetite   Selective mutism  Subjective Data: 48 year old Hispanic female with a history of schizophrenia (catatonic subtype), bipolar disorder, hypertension, and type 2 diabetes. She presented to the emergency department accompanied by her sister, who reported concerns about possible catatonia, medication nonadherence, and constipation. The sister stated that the patient had recently been discharged from a psychiatric hospital and had since become increasingly withdrawn--refusing to eat, drink, speak, or take medications. The sister also reported that the patient had not had a bowel movement in approximately two weeks and expressed concern about her physical condition, although the patient denied abdominal pain or vomiting.During the psychiatric assessment, the patient stated she had been feeling "bad" for the past 4-5days, describing symptoms of depression, hopelessness, and emotional withdrawal. She denied any recent life stressors or known triggers for her symptoms. The patient denied current suicidal or homicidal ideation and denied experiencing hallucinations or paranoia. She reported trying to take her medications regularly, though she acknowledged missing some doses.The patient expressed a desire for treatment and was cooperative during the interview. She reported difficulty sleeping and ongoing fatigue. She did not endorse any internal stimuli and stated she wanted help managing her mood and physical symptoms. The patient  has since begun participating in group therapy, ambulating in the milieu, and has shown some signs of engagement, though her affect remains flat.  Continued Clinical Symptoms:  Alcohol Use Disorder Identification Test Final Score (AUDIT): 0 The "Alcohol Use Disorders Identification Test", Guidelines for Use in Primary Care, Second Edition.  World Science writer [facility]). Score between 0-7:  no or low risk or alcohol related problems. Score between 8-15:  moderate risk of alcohol related problems. Score between 16-19:  high risk of alcohol related problems. Score 20 or above:  warrants further diagnostic evaluation for alcohol dependence and treatment.   CLINICAL FACTORS:   Anorexia Nervosa Bipolar Disorder:   Depressive phase Depression:   Anhedonia Hopelessness Insomnia Schizophrenia:   Depressive state Paranoid or undifferentiated type More than one psychiatric diagnosis Previous Psychiatric Diagnoses and Treatments   Musculoskeletal: Strength & Muscle Tone: within normal limits Gait & Station: normal Patient leans: N/A  Psychiatric Specialty Exam:  Presentation  General Appearance:  Appropriate for Environment (appropriately dressed, fair hygiene, appears stated age)  Eye Contact: Minimal (Initially withdrawn with limited responsiveness; currently alert, ambulating independently in the milieu, and engaging in group activities)  Speech: Slow; Other (comment) (initially minimal, now spontaneous)  Speech Volume: Decreased  Handedness: Right   Mood and Affect  Mood: Depressed  Affect: Flat (with limited emotional expression)   Thought Process  Thought Processes: Linear  Descriptions of Associations:Tangential  Orientation:Partial  Thought Content:Logical  History of Schizophrenia/Schizoaffective disorder:Yes  Duration of Psychotic Symptoms:Less than six months  Hallucinations:Hallucinations: None Description of Auditory Hallucinations:  denies  Ideas of Reference:None  Suicidal Thoughts:Suicidal Thoughts: No  Homicidal Thoughts:Homicidal Thoughts: No   Sensorium  Memory: Immediate Fair; Recent Fair; Remote Fair  Judgment: Fair (demonstrates willingness to participate in care and engage in treatment)  Insight: Fair (recognizes need for treatment and  reports efforts to remain medication compliant)   Executive Functions  Concentration: Fair  Attention Span: Fair  Recall: [employer] of Knowledge: Fair  Language: Fair   Psychomotor Activity  Psychomotor Activity:Psychomotor Activity: Normal   Assets  Assets: Social Support; Health and safety inspector; Resilience; Housing; Desire for Improvement   Sleep  Sleep:Sleep: Good Number of Hours of Sleep: 6    Physical Exam: Physical Exam Vitals and nursing note reviewed.  Constitutional:      Appearance: Normal appearance.  HENT:     Head: Normocephalic and atraumatic.     Nose: Nose normal.  Pulmonary:     Effort: Pulmonary effort is normal.  Musculoskeletal:        General: Normal range of motion.     Cervical back: Normal range of motion.  Neurological:     General: No focal deficit present.     Mental Status: She is alert. Mental status is at baseline.  Psychiatric:        Attention and Perception: Perception normal. She is inattentive.        Mood and Affect: Mood is depressed. Affect is flat.        Speech: Speech is delayed.        Behavior: Behavior is slowed. Behavior is cooperative.        Thought Content: Thought content normal.        Cognition and Memory: Cognition and memory normal.        Judgment: Judgment is impulsive.    Review of Systems  Psychiatric/Behavioral:  Positive for depression. The patient is nervous/anxious.   All other systems reviewed and are negative.  Blood pressure 107/74, pulse 83, temperature (!) 97.2 F (36.2 C), resp. rate 20, height 5\' 4"  (1.626 m), weight 73 kg, last menstrual period  07/05/2023, SpO2 98%. Body mass index is 27.64 kg/m.   COGNITIVE FEATURES THAT CONTRIBUTE TO RISK:  None    SUICIDE RISK:   Mild:  Suicidal ideation of limited frequency, intensity, duration, and specificity.  There are no identifiable plans, no associated intent, mild dysphoria and related symptoms, good self-control (both objective and subjective assessment), few other risk factors, and identifiable protective factors, including available and accessible social support.  PLAN OF CARE:  Collaborate with LCSW and care coordination team to enlist an Assertive Community Treatment (ACT) Team for continued medication management, individual therapy, and wraparound support upon discharge. Monitor psychiatric status, bowel function, nutritional intake, and response to medications. Continue Q15-minute safety checks until patient maintains consistent stability. Engage patient in ongoing group and individual therapy while inpatien Paliperidone (Invega) 3 mg PO daily - Currently tapering in preparation for Invega Sustenna LAI. Maintain current oral paliperidone 3 mg daily for 3-5 days prior to first [place] 234 mg IM injection. Follow with Invega Sustenna 156 mg IM on Day 8. Discontinue oral paliperidone after second injection (Day 8). Continue monthly maintenance LAI injections thereafter (117 mg or dose per clinical response). Fluoxetine 60 mg PO daily - For depressive symptoms and mood stabilization. Lithium 600 mg PO BID - For mood stabilization and bipolar disorder management.(Monitor lithium levels 0.06), renal function, and thyroid panel. Lorazepam (Ativan) 0.5 mg PO daily - For anxiety and catatonic features (monitor sedation). Melatonin 5 mg QHS - For sleep regulation. Metformin 500 mg PO BID with meals - For diabetes management; monitor blood glucose and A1c. Monitor capillary blood glucose (CBG) levels AC (before meals) and HS (at bedtime) to assess glycemic control. Monitor closely  for signs  of hypoglycemia, including shakiness, sweating, confusion, dizziness, irritability, or lethargy. I certify that inpatient services furnished can reasonably be expected to improve the patient's condition.   Myriam Forehand, NP 08/04/2023, 5:14 PM

## 2023-08-04 NOTE — Group Note (Signed)
 Recreation Therapy Group Note   Group Topic:Leisure Education  Group Date: 08/04/2023 Start Time: 1300 End Time: 1400 Facilitators: Rosina Lowenstein, LRT, CTRS Location:  Craft Room  Group Description: Leisure. Patients were given the option to choose from singing karaoke, coloring mandalas, using oil pastels, journaling, or playing with play-doh. LRT and pts discussed the meaning of leisure, the importance of participating in leisure during their free time/when they're outside of the hospital, as well as how our leisure interests can also serve as coping skills.   Goal Area(s) Addressed:  Patient will identify a current leisure interest.  Patient will learn the definition of "leisure". Patient will practice making a positive decision. Patient will have the opportunity to try a new leisure activity. Patient will communicate with peers and LRT.    Affect/Mood: N/A   Participation Level: Did not attend    Clinical Observations/Individualized Feedback: Patient interacted well with LRT and peers duration of session.   Plan: Continue to engage patient in RT group sessions 2-3x/week.   Rosina Lowenstein, LRT, CTRS 08/04/2023 5:21 PM

## 2023-08-05 DIAGNOSIS — F314 Bipolar disorder, current episode depressed, severe, without psychotic features: Secondary | ICD-10-CM | POA: Diagnosis not present

## 2023-08-05 LAB — GLUCOSE, CAPILLARY
Glucose-Capillary: 71 mg/dL (ref 70–99)
Glucose-Capillary: 90 mg/dL (ref 70–99)
Glucose-Capillary: 86 mg/dL (ref 70–99)

## 2023-08-05 LAB — VITAMIN B12: Vitamin B-12: 307 pg/mL (ref 180–914)

## 2023-08-05 NOTE — Progress Notes (Signed)
   08/05/23 0930  Psych Admission Type (Psych Patients Only)  Admission Status Voluntary  Psychosocial Assessment  Patient Complaints Anxiety;Depression;Sleep disturbance (patient states that she was up and down in her sleep last night; patient states that she has "a little bit" of depression and anxiety, but did not go into detail as to why she's feeling this way.)  Eye Contact Fair  Facial Expression Flat  Affect Sullen  Speech Soft;Logical/coherent  Interaction Minimal;Isolative (patient isolates to room, except for me)  Motor Activity Slow  Appearance/Hygiene In scrubs  Behavior Characteristics Cooperative;Appropriate to situation  Mood Pleasant (patient states that she feels like her medication is helping with her depression and anxiety some.)  Aggressive Behavior  Effect No apparent injury  Thought Process  Coherency WDL  Content WDL  Delusions None reported or observed  Perception WDL  Hallucination None reported or observed  Judgment WDL  Confusion None  Danger to Self  Current suicidal ideation? Denies  Agreement Not to Harm Self Yes  Description of Agreement Verbal  Danger to Others  Danger to Others None reported or observed

## 2023-08-05 NOTE — Progress Notes (Signed)
   08/04/23 2000  Psych Admission Type (Psych Patients Only)  Admission Status Voluntary  Psychosocial Assessment  Patient Complaints None  Eye Contact Fair  Facial Expression Sad  Affect Blunted  Speech Soft  Interaction Isolative  Motor Activity Slow  Appearance/Hygiene Improved  Behavior Characteristics Cooperative  Mood Pleasant  Thought Process  Coherency WDL  Content WDL  Delusions None reported or observed  Perception WDL  Hallucination None reported or observed  Judgment WDL  Confusion None  Danger to Self  Current suicidal ideation? Denies  Agreement Not to Harm Self Yes  Description of Agreement verbal  Danger to Others  Danger to Others None reported or observed

## 2023-08-05 NOTE — Group Note (Signed)
 Date:  08/05/2023 Time:  8:43 PM  Group Topic/Focus:  Orientation:   The focus of this group is to educate the patient on the purpose and policies of crisis stabilization and provide a format to answer questions about their admission.  The group details unit policies and expectations of patients while admitted. Wrap-Up Group:   The focus of this group is to help patients review their daily goal of treatment and discuss progress on daily workbooks.    Participation Level:  Minimal  Participation Quality:  Appropriate and Attentive  Affect:  Appropriate, Flat, and Resistant  Cognitive:  Alert and Appropriate  Insight: Appropriate  Engagement in Group:  Limited and Resistant  Modes of Intervention:  Discussion and Orientation  Additional Comments:     Maglione,Bassam Dresch E 08/05/2023, 8:43 PM

## 2023-08-05 NOTE — Plan of Care (Signed)
   Problem: Education: Goal: Mental status will improve Outcome: Progressing   Problem: Education: Goal: Knowledge of [facility] Education information/materials will improve Outcome: Progressing
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
## 2023-08-05 NOTE — Plan of Care (Signed)

## 2023-08-06 DIAGNOSIS — F314 Bipolar disorder, current episode depressed, severe, without psychotic features: Secondary | ICD-10-CM | POA: Diagnosis not present

## 2023-08-06 LAB — GLUCOSE, CAPILLARY
Glucose-Capillary: 95 mg/dL (ref 70–99)
Glucose-Capillary: 99 mg/dL (ref 70–99)
Glucose-Capillary: 132 mg/dL — ABNORMAL HIGH (ref 70–99)
Glucose-Capillary: 94 mg/dL (ref 70–99)

## 2023-08-06 MED ORDER — FLUOXETINE HCL 20 MG PO CAPS
20.0000 mg | ORAL_CAPSULE | Freq: Every day | ORAL | Status: DC
Start: 2023-08-07 — End: 2023-08-10
  Administered 2023-08-07 – 2023-08-10 (×4): 20 mg via ORAL
  Filled 2023-08-06 (×4): qty 1

## 2023-08-06 MED ORDER — FLUOXETINE HCL 20 MG PO CAPS: 60.0000 mg | ORAL_CAPSULE | Freq: Every day | ORAL | Status: DC

## 2023-08-06 NOTE — Progress Notes (Addendum)
    08/06/23 1200  Psych Admission Type (Psych Patients Only)  Admission Status Voluntary  Psychosocial Assessment  Patient Complaints Anxiety;Depression (patient states "I feel some depression", and "a little bit of anxiety".)  Eye Contact Fair  Facial Expression Flat  Affect Sullen  Speech Soft;Logical/coherent  Interaction Minimal;Isolative (patient isolates to room, except for meals and medication)  Motor Activity Slow  Appearance/Hygiene In scrubs  Behavior Characteristics Cooperative;Appropriate to situation  Mood Pleasant (patient had no stated goals for today)  Aggressive Behavior  Effect No apparent injury  Thought Process  Coherency WDL  Content WDL  Delusions None reported or observed  Perception WDL  Hallucination None reported or observed (patient stated that she hears voices sometimes, but not at this time.)  Judgment WDL  Confusion None  Danger to Self  Current suicidal ideation? Denies  Agreement Not to Harm Self Yes  Description of Agreement Verbal  Danger to Others  Danger to Others None reported or observed

## 2023-08-06 NOTE — [facility] Suicide Risk Assessment (Signed)
 [facility] INPATIENT:  Family/Significant Other Suicide Prevention Education  Suicide Prevention Education:  Contact Attempts:  Fanciso Novas, mom, [phone], has been identified by the patient as the family member/significant other with whom the patient will be residing, and identified as the person(s) who will aid the patient in the event of a mental health crisis.  With written consent from the patient, two attempts were made to provide suicide prevention education, prior to and/or following the patient's discharge.  We were unsuccessful in providing suicide prevention education.  A suicide education pamphlet was given to the patient to share with family/significant other.  Date and time of first attempt:08/06/23 at 3:44 pm Date and time of second attempt: second attempt needed  Marshell Levan 08/06/2023, 3:43 PM

## 2023-08-06 NOTE — Plan of Care (Signed)
  Problem: Education: Goal: Knowledge of [facility] Education information/materials will improve Outcome: Progressing   Problem: Education: Goal: Emotional status will improve Outcome: Progressing   Problem: Education: Goal: Mental status will improve Outcome: Progressing   

## 2023-08-06 NOTE — Progress Notes (Signed)
   08/05/23 2000  Psych Admission Type (Psych Patients Only)  Admission Status Voluntary  Psychosocial Assessment  Eye Contact Fair  Facial Expression Sad  Affect Blunted  Speech Soft  Interaction Isolative  Motor Activity Slow  Appearance/Hygiene Improved  Mood Pleasant  Thought Process  Coherency WDL  Content WDL  Delusions None reported or observed  Perception WDL  Hallucination None reported or observed  Judgment WDL  Confusion None  Danger to Self  Current suicidal ideation? Denies  Agreement Not to Harm Self Yes  Description of Agreement verbal  Danger to Others  Danger to Others None reported or observed   Patient alert and oriented x 3 no distress noted, her thoughts are organized and coherent, 15 minutes safety checks maintained.

## 2023-08-06 NOTE — Group Note (Signed)
 Date:  08/06/2023 Time:  7:16 PM  Group Topic/Focus:  Activity Group: The focus of the group is to promote activity for the patients and encourage them to go outside to the courtyard and get some fresh air and some exercise.    Participation Level:  Active  Participation Quality:  Appropriate  Affect:  Appropriate  Cognitive:  Appropriate  Insight: Appropriate  Engagement in Group:  Engaged  Modes of Intervention:  Activity  Additional Comments:    Mary Sella Savi Lastinger 08/06/2023, 7:16 PM

## 2023-08-06 NOTE — Plan of Care (Signed)
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
## 2023-08-06 NOTE — Group Note (Signed)
 Date:  08/06/2023 Time:  7:09 PM  Group Topic/Focus:  Goals Group:   The focus of this group is to help patients establish daily goals to achieve during treatment and discuss how the patient can incorporate goal setting into their daily lives to aide in recovery.    Participation Level:  Did Not Attend   Isabella [name] 08/06/2023, 7:09 PM

## 2023-08-06 NOTE — Group Note (Signed)
 Date:  08/06/2023 Time:  9:54 PM  Group Topic/Focus:  Wrap-Up Group:   The focus of this group is to help patients review their daily goal of treatment and discuss progress on daily workbooks.    Participation Level:  Active  Participation Quality:  Appropriate and Attentive  Affect:  Appropriate  Cognitive:  Alert and Appropriate  Insight: Appropriate  Engagement in Group:  Improving  Modes of Intervention:  Discussion and Orientation  Additional Comments:     Maglione,Casimiro Lienhard E 08/06/2023, 9:54 PM

## 2023-08-06 NOTE — BHH Counselor (Signed)
 Adult Comprehensive Assessment  Patient ID: Isabella [name], female   DOB: Apr 21, 1976, 48 y.o.   MRN: [id number]  Information Source: Information source: Patient   Current Stressors:  Patient states their primary concerns and needs for treatment are: The patient stated my mental health. Patient states their goals for this hospitalization and ongoing recovery are: The patient stated to try to do better.   Living/Environment/Situation:  Living Arrangements: Other relatives   Family History:  Marital status: Separated Separated, when?: "For a couple years." What types of issues is patient dealing with in the relationship?: "he separated from me and didn't talk to me" Does patient have children?: Yes How many children?: 2 How is patient's relationship with their children?: "I don't talk to them everyday but it's good"   Childhood History:  By whom was/is the patient raised?: Both parents Does patient have siblings?: Yes Number of Siblings: 3 Description of patient's current relationship with siblings: "Good" Did patient suffer any verbal/emotional/physical/sexual abuse as a child?: No Did patient suffer from severe childhood neglect?: No Has patient ever been sexually abused/assaulted/raped as an adolescent or adult?: No Was the patient ever a victim of a crime or a disaster?: No Witnessed domestic violence?: No Has patient been affected by domestic violence as an adult?: No   Education:  Highest grade of school patient has completed: "8th grade" Currently a student?: No Learning disability?: No   Employment/Work Situation:   Employment Situation: Unemployed Why is Patient on Disability: Pt denies. How Long has Patient Been on Disability: Pt denies. What is the Longest Time Patient has Held a Job?: Pt denies. Where was the Patient Employed at that Time?: Pt denies. Has Patient ever Been in the [employer]?: No   Financial Resources:   Financial resources:  Medicaid Does patient have a representative payee or guardian?: No   Alcohol/Substance Abuse:   What has been your use of drugs/alcohol within the last 12 months?: The patient stated none. If attempted suicide, did drugs/alcohol play a role in this?: No Alcohol/Substance Abuse Treatment Hx: Denies past history Has alcohol/substance abuse ever caused legal problems?: No   Social Support System:   Patient's Community Support System: Fair Development worker, community Support System: "my mom and dad" Type of faith/religion: The patient stated Catholic. How does patient's faith help to cope with current illness?: The patient stated the bible.   Leisure/Recreation:   Do You Have Hobbies?: No   Strengths/Needs:   What is the patient's perception of their strengths?: "I don't know" Patient states these barriers may affect/interfere with their treatment: None reported. Patient states these barriers may affect their return to the community: None reported. Other important information patient would like considered in planning for their treatment: None reported.   Discharge Plan:   Currently receiving community mental health services: Yes (From Whom) [facility]) Patient states concerns and preferences for aftercare planning are: The patient stated that she would like to continue with [facility] but she missed an appointment.  Patient states they will know when they are safe and ready for discharge when: The patient stated that she did not know. Does patient have access to transportation?: Yes Does patient have financial barriers related to discharge medications?: No Plan for no access to transportation at discharge: CSW to assist with transportation needs. Will patient be returning to same living situation after discharge?: Yes   Summary/Recommendations:    The patient is a 48 year old female from [facility]) with history of hypertension, bipolar disorder, schizophrenia who  presents to the  emergency department with her sister for concerns for catatonia. The patient sister reported that the patient was recently admitted to a psychiatric hospital but since discharge has become catatonic again where she is not talking, eating or drinking, taking her medications regularly. During the assessment the patient stated that her primary concerns were the same as she experienced before. The patient stated that she receives Medicaid. The patient stated that she was receiving disability up until 3 months ago. The patient is unemployed. The patient denies daily stressors. The patient denies drug or alcohol use. The patient denies access to guns. The patient stated that she was receiving mental health services from [facility] but missed an appointment so wants to make sure she can return. Recommendations include: crisis stabilization, therapeutic milieu, encourage group attendance and participation, medication management for detox/mood stabilization and development of comprehensive mental wellness.     Marshell Levan. 08/06/2023

## 2023-08-07 DIAGNOSIS — F314 Bipolar disorder, current episode depressed, severe, without psychotic features: Secondary | ICD-10-CM | POA: Diagnosis not present

## 2023-08-07 LAB — GLUCOSE, CAPILLARY
Glucose-Capillary: 89 mg/dL (ref 70–99)
Glucose-Capillary: 92 mg/dL (ref 70–99)

## 2023-08-07 MED ORDER — VITAMIN B-12 1000 MCG PO TABS
1000.0000 ug | ORAL_TABLET | Freq: Every day | ORAL | Status: DC
  Administered 2023-08-08 – 2023-08-17 (×10): 1000 ug via ORAL
  Filled 2023-08-07 (×12): qty 1

## 2023-08-07 NOTE — Plan of Care (Signed)
  Problem: Education: Goal: Knowledge of [facility] Education information/materials will improve Outcome: Progressing   Problem: Education: Goal: Emotional status will improve Outcome: Progressing   Problem: Education: Goal: Mental status will improve Outcome: Progressing   Problem: Coping: Goal: Ability to verbalize frustrations and anger appropriately will improve Outcome: Progressing   Problem: Coping: Goal: Ability to demonstrate self-control will improve Outcome: Progressing   Problem: Physical Regulation: Goal: Ability to maintain clinical measurements within normal limits will improve Outcome: Progressing   Problem: Safety: Goal: Periods of time without injury will increase Outcome: Progressing

## 2023-08-07 NOTE — Group Note (Signed)
 Recreation Therapy Group Note   Group Topic:Coping Skills  Group Date: 08/07/2023 Start Time: 1100 End Time: 1140 Facilitators: Rosina Lowenstein, LRT, CTRS Location:  Craft Room  Group Description: Mind Map.  Patient was provided a blank template of a diagram with 32 blank boxes in a tiered system, branching from the center (similar to a bubble chart). LRT directed patients to label the middle of the diagram "Coping Skills". LRT and patients then came up with 8 different coping skills as examples. Pt were directed to record their coping skills in the 2nd tier boxes closest to the center.  Patients would then share their coping skills with the group as LRT wrote them out. LRT gave a handout of 99 different coping skills at the end of group.   Goal Area(s) Addressed: Patients will be able to define "coping skills". Patient will identify new coping skills.  Patient will increase communication.   Affect/Mood: N/A   Participation Level: Did not attend    Clinical Observations/Individualized Feedback: Patient did not attend group.   Plan: Continue to engage patient in RT group sessions 2-3x/week.   [address], LRT, CTRS 08/07/2023 1:26 PM

## 2023-08-07 NOTE — Group Note (Signed)
 Recreation Therapy Group Note   Group Topic:Health and Wellness  Group Date: 08/07/2023 Start Time: 1530 End Time: 1635 Facilitators: Rosina Lowenstein, LRT, CTRS Location: Courtyard  Group Description: [employer]. LRT and patients played games of basketball, drew with chalk, and played corn hole while outside in the courtyard while getting fresh air and sunlight. Music was being played in the background. LRT and peers conversed about different games they have played before, what they do in their free time and anything else that is on their minds. LRT encouraged pts to drink water after being outside, sweating and getting their heart rate up.  Goal Area(s) Addressed: Patient will build on frustration tolerance skills. Patients will partake in a competitive play game with peers. Patients will gain knowledge of new leisure interest/hobby.    Affect/Mood: N/A   Participation Level: Did not attend    Clinical Observations/Individualized Feedback: Isabella [name] did not attend group.   Plan: Continue to engage patient in RT group sessions 2-3x/week.   Rosina Lowenstein, LRT, CTRS 08/07/2023 5:32 PM

## 2023-08-07 NOTE — Progress Notes (Signed)
   08/07/23 1100  Psych Admission Type (Psych Patients Only)  Admission Status Voluntary  Psychosocial Assessment  Patient Complaints None  Eye Contact Fair  Facial Expression Flat  Affect Sullen  Speech Soft  Interaction Isolative  Motor Activity Slow  Appearance/Hygiene Improved  Behavior Characteristics Cooperative;Appropriate to situation  Mood Pleasant  Thought Process  Coherency WDL  Content WDL  Delusions None reported or observed  Perception WDL  Hallucination None reported or observed  Judgment Impaired  Confusion None  Danger to Self  Current suicidal ideation? Denies  Danger to Others  Danger to Others None reported or observed   Patient stated that she is feeling better today. ADLs maintained. Support and encouragement given.

## 2023-08-07 NOTE — Group Note (Signed)
 Date:  08/07/2023 Time:  9:02 PM  Group Topic/Focus:  Wrap-Up Group:   The focus of this group is to help patients review their daily goal of treatment and discuss progress on daily workbooks.    Participation Level:  Active  Participation Quality:  Appropriate  Affect:  Appropriate  Cognitive:  Appropriate  Insight: Appropriate  Engagement in Group:  Limited  Modes of Intervention:  Discussion  Additional Comments:     Belva Crome 08/07/2023, 9:02 PM

## 2023-08-07 NOTE — Group Note (Signed)
 Date:  08/07/2023 Time:  11:00 AM  Group Topic/Focus:  Goals Group:   The focus of this group is to help patients establish daily goals to achieve during treatment and discuss how the patient can incorporate goal setting into their daily lives to aide in recovery.   Participation Level:  Did Not Attend   Maha Fischel A Jenniferlynn Saad 08/07/2023, 11:00 AM
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
## 2023-08-07 NOTE — Plan of Care (Signed)
  Problem: Education: Goal: Verbalization of understanding the information provided will improve Outcome: Progressing   Problem: Education: Goal: Mental status will improve Outcome: Progressing   Problem: Education: Goal: Emotional status will improve Outcome: Progressing

## 2023-08-07 NOTE — Group Note (Signed)
 LCSW Group Therapy Note   Group Date: 08/07/2023 Start Time: 1300 End Time: 1400   Type of Therapy and Topic:  Group Therapy: Challenging Core Beliefs  Participation Level:  Did Not Attend  Description of Group:  Patients were educated about core beliefs and asked to identify one harmful core belief that they have. Patients were asked to explore from where those beliefs originate. Patients were asked to discuss how those beliefs make them feel and the resulting behaviors of those beliefs. They were then be asked if those beliefs are true and, if so, what evidence they have to support them. Lastly, group members were challenged to replace those negative core beliefs with helpful beliefs.   Therapeutic Goals:   1. Patient will identify harmful core beliefs and explore the origins of such beliefs. 2. Patient will identify feelings and behaviors that result from those core beliefs. 3. Patient will discuss whether such beliefs are true. 4.  Patient will replace harmful core beliefs with helpful ones.  Summary of Patient Progress:  Patient did not attend.   Therapeutic Modalities: Cognitive Behavioral Therapy; Solution-Focused Therapy   [name], LCSWA 08/07/2023  2:10 PM

## 2023-08-07 NOTE — Progress Notes (Signed)
   08/06/23 2000  Psych Admission Type (Psych Patients Only)  Admission Status Voluntary  Psychosocial Assessment  Patient Complaints Anxiety;Depression  Eye Contact Fair  Facial Expression Sad  Affect Blunted  Speech Soft  Interaction Isolative  Motor Activity Slow  Appearance/Hygiene Improved  Behavior Characteristics Cooperative;Appropriate to situation  Mood Pleasant  Thought Process  Coherency WDL  Content WDL  Delusions None reported or observed  Perception WDL  Hallucination None reported or observed  Judgment WDL  Confusion None  Danger to Self  Current suicidal ideation? Denies  Agreement Not to Harm Self Yes  Description of Agreement verbal  Danger to Others  Danger to Others None reported or observed   Patient is alert and oriented x 3, her thoughts are organized, interacting with peers and staff, no distress noted, denies SI/HI/AVH will continue to monitor.

## 2023-08-08 DIAGNOSIS — F314 Bipolar disorder, current episode depressed, severe, without psychotic features: Secondary | ICD-10-CM | POA: Diagnosis not present

## 2023-08-08 MED ORDER — PALIPERIDONE ER 3 MG PO TB24
9.0000 mg | ORAL_TABLET | Freq: Every day | ORAL | Status: AC
  Administered 2023-08-09 – 2023-08-16 (×8): 9 mg via ORAL
  Filled 2023-08-08 (×8): qty 3

## 2023-08-08 NOTE — Group Note (Signed)
 Date:  08/08/2023 Time:  9:14 PM  Group Topic/Focus:  Emotional Education:   The focus of this group is to discuss what feelings/emotions are, and how they are experienced.    Participation Level:  Active  Participation Quality:  Appropriate and Attentive  Affect:  Appropriate  Cognitive:  Alert and Appropriate  Insight: Appropriate and Good  Engagement in Group:  Developing/Improving and Engaged  Modes of Intervention:  Clarification, Discussion, Limit-setting, Rapport Building, Socialization, and Support  Additional Comments:     Samhitha Rosen 08/08/2023, 9:14 PM

## 2023-08-08 NOTE — Progress Notes (Signed)
 Isolative to self. Voiced no concerns or complaints. Denies SI, HI, AVH. Med compliant. Minimal interaction with staff and peers. Visible in milieu.  Encouragement and support provided. Safety checks maintained. Medications given as prescribed. Pt receptive and remains safe on unit with q 15 min checks.

## 2023-08-08 NOTE — Plan of Care (Signed)
  Problem: Education: Goal: Emotional status will improve Outcome: Progressing   Problem: Education: Goal: Mental status will improve Outcome: Progressing   Problem: Activity: Goal: Sleeping patterns will improve Outcome: Progressing   Problem: Safety: Goal: Periods of time without injury will increase Outcome: Progressing   

## 2023-08-08 NOTE — Group Note (Signed)
 Recreation Therapy Group Note   Group Topic:General Recreation  Group Date: 08/08/2023 Start Time: 1430 End Time: 1535 Facilitators: Rosina Lowenstein, LRT, CTRS Location: Courtyard  Group Description: [employer]. LRT and patients played games of basketball, drew with chalk, and played corn hole while outside in the courtyard while getting fresh air and sunlight. Music was being played in the background. LRT and peers conversed about different games they have played before, what they do in their free time and anything else that is on their minds. LRT encouraged pts to drink water after being outside, sweating and getting their heart rate up.  Goal Area(s) Addressed: Patient will build on frustration tolerance skills. Patients will partake in a competitive play game with peers. Patients will gain knowledge of new leisure interest/hobby.    Affect/Mood: N/A   Participation Level: Did not attend    Clinical Observations/Individualized Feedback: Patient did not attend group.   Plan: Continue to engage patient in RT group sessions 2-3x/week.   Rosina Lowenstein, LRT, CTRS 08/08/2023 5:06 PM

## 2023-08-08 NOTE — Progress Notes (Signed)
   08/08/23 1000  Psych Admission Type (Psych Patients Only)  Admission Status Voluntary  Psychosocial Assessment  Patient Complaints Depression  Eye Contact Fair  Facial Expression Sad  Affect Sullen  Speech Soft;Logical/coherent  Interaction Isolative;Minimal  Motor Activity Slow  Appearance/Hygiene Unremarkable;In scrubs  Behavior Characteristics Cooperative;Calm  Thought Process  Coherency WDL  Content WDL  Delusions None reported or observed  Perception WDL  Hallucination None reported or observed  Judgment Impaired  Confusion None  Danger to Others  Danger to Others None reported or observed

## 2023-08-08 NOTE — Plan of Care (Signed)
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
## 2023-08-08 NOTE — Group Note (Signed)
 [facility] LCSW Group Therapy Note   Group Date: 08/08/2023 Start Time: 1300 End Time: 1400  Type of Therapy/Topic:  Group Therapy:  Feelings about Diagnosis  Participation Level:  Did Not Attend   Description of Group:    This group will allow patients to explore their thoughts and feelings about diagnoses they have received. Patients will be guided to explore their level of understanding and acceptance of these diagnoses. Facilitator will encourage patients to process their thoughts and feelings about the reactions of others to their diagnosis, and will guide patients in identifying ways to discuss their diagnosis with significant others in their lives. This group will be process-oriented, with patients participating in exploration of their own experiences as well as giving and receiving support and challenge from other group members.   Therapeutic Goals: 1. Patient will demonstrate understanding of diagnosis as evidence by identifying two or more symptoms of the disorder:  2. Patient will be able to express two feelings regarding the diagnosis 3. Patient will demonstrate ability to communicate their needs through discussion and/or role plays  Summary of Patient Progress:    Patient declined to attend group.    Therapeutic Modalities:   Cognitive Behavioral Therapy Brief Therapy Feelings Identification    Harden Mo, LCSW
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
## 2023-08-09 DIAGNOSIS — F314 Bipolar disorder, current episode depressed, severe, without psychotic features: Secondary | ICD-10-CM | POA: Diagnosis not present

## 2023-08-09 MED ORDER — DOCUSATE SODIUM 100 MG PO CAPS
100.0000 mg | ORAL_CAPSULE | Freq: Two times a day (BID) | ORAL | Status: DC
  Administered 2023-08-10 – 2023-08-17 (×10): 100 mg via ORAL
  Filled 2023-08-09 (×11): qty 1
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
## 2023-08-09 NOTE — Progress Notes (Signed)
   08/09/23 0810  Psych Admission Type (Psych Patients Only)  Admission Status Involuntary  Psychosocial Assessment  Patient Complaints Depression;Anxiety (Patient reports feeling "better today" with only a "little anxiety and depression." She continues to report left hip pain, dizziness, and lower energy levels per her self inventory.)  Eye Contact Fair  Facial Expression Sad  Affect Sullen  Speech Logical/coherent  Interaction Isolative;Minimal  Motor Activity Slow  Appearance/Hygiene Unremarkable;In scrubs  Behavior Characteristics Cooperative;Calm (Patient continues to lack routine group participation.)  Thought Process  Coherency WDL  Content WDL  Delusions None reported or observed  Perception WDL  Hallucination None reported or observed  Judgment Impaired  Confusion None  Danger to Self  Current suicidal ideation? Denies  Agreement Not to Harm Self No  Danger to Others  Danger to Others None reported or observed

## 2023-08-09 NOTE — Plan of Care (Signed)
   Problem: Education: Goal: Knowledge of [facility] Education information/materials will improve Outcome: Progressing Goal: Emotional status will improve Outcome: Progressing Goal: Mental status will improve Outcome: Progressing

## 2023-08-09 NOTE — Plan of Care (Signed)

## 2023-08-09 NOTE — BH IP Treatment Plan (Signed)
 Interdisciplinary Treatment and Diagnostic Plan Update  08/09/2023 Time of Session: 14:00 Isabella [name] MRN: [id number]  Principal Diagnosis: Severe bipolar I disorder, current or most recent episode depressed (HCC)  Secondary Diagnoses: Principal Problem:   Severe bipolar I disorder, current or most recent episode depressed (HCC) Active Problems:   Decreased appetite   Selective mutism   Current Medications:  Current Facility-Administered Medications  Medication Dose Route Frequency Provider Last Rate Last Admin   acetaminophen (TYLENOL) tablet 650 mg  650 mg Oral Q6H PRN Onuoha, Chinwendu V, NP   650 mg at 08/08/23 0832   alum & mag hydroxide-simeth (MAALOX/MYLANTA) 200-200-20 MG/5ML suspension 30 mL  30 mL Oral Q4H PRN Onuoha, Chinwendu V, NP       atorvastatin (LIPITOR) tablet 20 mg  20 mg Oral Daily Myriam Forehand, NP   20 mg at 08/09/23 7829   cyanocobalamin (VITAMIN B12) tablet 1,000 mcg  1,000 mcg Oral Daily Tingling, Stephanie, PA-C   1,000 mcg at 08/09/23 0805   haloperidol (HALDOL) tablet 5 mg  5 mg Oral TID PRN Onuoha, Chinwendu V, NP       And   diphenhydrAMINE (BENADRYL) capsule 50 mg  50 mg Oral TID PRN Onuoha, Chinwendu V, NP       haloperidol lactate (HALDOL) injection 5 mg  5 mg Intramuscular TID PRN Onuoha, Chinwendu V, NP       And   diphenhydrAMINE (BENADRYL) injection 50 mg  50 mg Intramuscular TID PRN Onuoha, Chinwendu V, NP       And   LORazepam (ATIVAN) injection 2 mg  2 mg Intramuscular TID PRN Onuoha, Chinwendu V, NP       haloperidol lactate (HALDOL) injection 10 mg  10 mg Intramuscular TID PRN Onuoha, Chinwendu V, NP       And   diphenhydrAMINE (BENADRYL) injection 50 mg  50 mg Intramuscular TID PRN Onuoha, Chinwendu V, NP       And   LORazepam (ATIVAN) injection 2 mg  2 mg Intramuscular TID PRN Onuoha, Chinwendu V, NP       feeding supplement (GLUCERNA SHAKE) (GLUCERNA SHAKE) liquid 237 mL  237 mL Oral BID BM Myriam Forehand, NP   237 mL at  08/09/23 1002   FLUoxetine (PROZAC) capsule 20 mg  20 mg Oral Daily Myriam Forehand, NP   20 mg at 08/09/23 0805   lithium carbonate (LITHOBID) ER tablet 600 mg  600 mg Oral Q12H Myriam Forehand, NP   600 mg at 08/09/23 0805   LORazepam (ATIVAN) tablet 0.5 mg  0.5 mg Oral Daily Myriam Forehand, NP   0.5 mg at 08/09/23 0805   magnesium hydroxide (MILK OF MAGNESIA) suspension 30 mL  30 mL Oral Daily PRN Onuoha, Chinwendu V, NP       melatonin tablet 5 mg  5 mg Oral QHS Myriam Forehand, NP   5 mg at 08/08/23 2117   metFORMIN (GLUCOPHAGE) tablet 500 mg  500 mg Oral Q breakfast Myriam Forehand, NP   500 mg at 08/09/23 5621   paliperidone (INVEGA) 24 hr tablet 9 mg  9 mg Oral Daily Tingling, Stephanie, PA-C   9 mg at 08/09/23 0805   PTA Medications: Medications Prior to Admission  Medication Sig Dispense Refill Last Dose/Taking   atorvastatin (LIPITOR) 20 MG tablet Take 1 tablet (20 mg total) by mouth daily. 30 tablet 0    FLUoxetine (PROZAC) 20 MG capsule Take 3 capsules (60 mg total)  by mouth daily. 90 capsule 3    hydrOXYzine (ATARAX) 25 MG tablet Take 1 tablet (25 mg total) by mouth 3 (three) times daily as needed for anxiety. 30 tablet 0    lithium carbonate (LITHOBID) 300 MG ER tablet Take 1 tablet (300 mg total) by mouth every 12 (twelve) hours. 60 tablet 0    LORazepam (ATIVAN) 0.5 MG tablet Take 1 tablet (0.5 mg total) by mouth daily. 30 tablet 0    melatonin 5 MG TABS Take 1 tablet (5 mg total) by mouth at bedtime. 30 tablet 0    metFORMIN (GLUCOPHAGE) 500 MG tablet Take 1 tablet (500 mg total) by mouth 2 (two) times daily with a meal. 60 tablet 0    paliperidone (INVEGA) 3 MG 24 hr tablet Take 1 tablet (3 mg total) by mouth daily. 30 tablet 0    traZODone (DESYREL) 50 MG tablet Take 1 tablet (50 mg total) by mouth at bedtime as needed for sleep. 30 tablet 0     Patient Stressors: Other: depression and anxiety    Patient Strengths: Capable of independent living  Financial means  Supportive  family/friends   Treatment Modalities: Medication Management, Group therapy, Case management,  1 to 1 session with clinician, Psychoeducation, Recreational therapy.   Physician Treatment Plan for Primary Diagnosis: Severe bipolar I disorder, current or most recent episode depressed (HCC) Long Term Goal(s): Improvement in symptoms so as ready for discharge   Short Term Goals: Ability to identify changes in lifestyle to reduce recurrence of condition will improve Ability to verbalize feelings will improve Ability to disclose and discuss suicidal ideas Ability to demonstrate self-control will improve Ability to identify and develop effective coping behaviors will improve Ability to maintain clinical measurements within normal limits will improve Compliance with prescribed medications will improve Ability to identify triggers associated with substance abuse/mental health issues will improve  Medication Management: Evaluate patient's response, side effects, and tolerance of medication regimen.  Therapeutic Interventions: 1 to 1 sessions, Unit Group sessions and Medication administration.  Evaluation of Outcomes: Progressing  Physician Treatment Plan for Secondary Diagnosis: Principal Problem:   Severe bipolar I disorder, current or most recent episode depressed (HCC) Active Problems:   Decreased appetite   Selective mutism  Long Term Goal(s): Improvement in symptoms so as ready for discharge   Short Term Goals: Ability to identify changes in lifestyle to reduce recurrence of condition will improve Ability to verbalize feelings will improve Ability to disclose and discuss suicidal ideas Ability to demonstrate self-control will improve Ability to identify and develop effective coping behaviors will improve Ability to maintain clinical measurements within normal limits will improve Compliance with prescribed medications will improve Ability to identify triggers associated with  substance abuse/mental health issues will improve     Medication Management: Evaluate patient's response, side effects, and tolerance of medication regimen.  Therapeutic Interventions: 1 to 1 sessions, Unit Group sessions and Medication administration.  Evaluation of Outcomes: Progressing   RN Treatment Plan for Primary Diagnosis: Severe bipolar I disorder, current or most recent episode depressed (HCC) Long Term Goal(s): Knowledge of disease and therapeutic regimen to maintain health will improve  Short Term Goals: Ability to remain free from injury will improve, Ability to verbalize frustration and anger appropriately will improve, Ability to demonstrate self-control, Ability to participate in decision making will improve, Ability to verbalize feelings will improve, Ability to disclose and discuss suicidal ideas, Ability to identify and develop effective coping behaviors will improve, and Compliance with prescribed  medications will improve  Medication Management: RN will administer medications as ordered by provider, will assess and evaluate patient's response and provide education to patient for prescribed medication. RN will report any adverse and/or side effects to prescribing provider.  Therapeutic Interventions: 1 on 1 counseling sessions, Psychoeducation, Medication administration, Evaluate responses to treatment, Monitor vital signs and CBGs as ordered, Perform/monitor CIWA, COWS, AIMS and Fall Risk screenings as ordered, Perform wound care treatments as ordered.  Evaluation of Outcomes: Progressing   LCSW Treatment Plan for Primary Diagnosis: Severe bipolar I disorder, current or most recent episode depressed (HCC) Long Term Goal(s): Safe transition to appropriate next level of care at discharge, Engage patient in therapeutic group addressing interpersonal concerns.  Short Term Goals: Engage patient in aftercare planning with referrals and resources, Increase social support,  Increase ability to appropriately verbalize feelings, Increase emotional regulation, Facilitate acceptance of mental health diagnosis and concerns, and Increase skills for wellness and recovery  Therapeutic Interventions: Assess for all discharge needs, 1 to 1 time with Social worker, Explore available resources and support systems, Assess for adequacy in community support network, Educate family and significant other(s) on suicide prevention, Complete Psychosocial Assessment, Interpersonal group therapy.  Evaluation of Outcomes: Progressing   Progress in Treatment: Attending groups: Yes. Participating in groups: Yes. and No. Taking medication as prescribed: Yes. Toleration medication: Yes. Family/Significant other contact made: No, will contact:  mom, Fanciso Novas. Patient understands diagnosis: Yes. Discussing patient identified problems/goals with staff: Yes. Medical problems stabilized or resolved: Yes. Denies suicidal/homicidal ideation: Yes. Issues/concerns per patient self-inventory: No. Other: none.  New problem(s) identified: No, Describe:  none Update 08/09/23: No changes at this time.   New Short Term/Long Term Goal(s): detox, elimination of symptoms of psychosis, medication management for mood stabilization; elimination of SI thoughts; development of comprehensive mental wellness/sobriety plan. Update 08/09/23: No changes at this time.   Patient Goals:  "try to do my best" Update 08/09/23: No changes at this time.   Discharge Plan or Barriers: Patient reports plans to return to her home.  She reports that she has not been consistent with aftercare plans.  It is recommended that patient have an ACT team. Update 08/09/23: No changes at this time.   Reason for Continuation of Hospitalization: Anxiety Depression Medication stabilization   Estimated Length of Stay:  1-7 days Update 08/09/23: TBD.  Last 3 [place] Suicide Severity Risk Score: Flowsheet Row Admission (Current)  from 08/03/2023 in [facility]INE Admission (Discharged) from 07/17/2023 in [facility] INPATIENT BEHAVIORAL MEDICINE ED from 07/16/2023 in [facility] at [facility]  C-SSRS RISK CATEGORY No Risk No Risk No Risk       Last PHQ 2/9 Scores:    07/16/2023   11:42 PM  Depression screen PHQ 2/9  Decreased Interest 1  Down, Depressed, Hopeless 1  PHQ - 2 Score 2  Altered sleeping 1  Tired, decreased energy 1  Change in appetite 1  Feeling bad or failure about yourself  1  Trouble concentrating 1  Moving slowly or fidgety/restless 1  Suicidal thoughts 0  PHQ-9 Score 8    Scribe for Treatment Team: Glenis Smoker, [place] 08/09/2023 3:31 PM

## 2023-08-09 NOTE — Group Note (Signed)
 [facility] LCSW Group Therapy Note   Group Date: 08/09/2023 Start Time: 1310 End Time: 1350   Type of Therapy/Topic:  Group Therapy:  Emotion Regulation  Participation Level:  None    Description of Group:    The purpose of this group is to assist patients in learning to regulate negative emotions and experience positive emotions. Patients will be guided to discuss ways in which they have been vulnerable to their negative emotions. These vulnerabilities will be juxtaposed with experiences of positive emotions or situations, and patients challenged to use positive emotions to combat negative ones. Special emphasis will be placed on coping with negative emotions in conflict situations, and patients will process healthy conflict resolution skills.  Therapeutic Goals: Patient will identify two positive emotions or experiences to reflect on in order to balance out negative emotions:  Patient will label two or more emotions that they find the most difficult to experience:  Patient will be able to demonstrate positive conflict resolution skills through discussion or role plays:   Summary of Patient Progress: Patient was present for the entirety of the group process. She participated in the icebreaker, stating that she likes to go to the park as her favorite down time activity. However, she did not participate in the larger discussion. Insight into topic and herself remains questionable.   Therapeutic Modalities:   Cognitive Behavioral Therapy Feelings Identification Dialectical Behavioral Therapy   Glenis Smoker, LCSW

## 2023-08-09 NOTE — Progress Notes (Signed)
 Pt calm and pleasant during assessment stating to this writer that she was feeling better. Pt observed by this Clinical research associate interacting appropriately with staff and peers on the unit. Pt compliant with medication administration per MD orders. Pt given education, support, and encouragement to be active in her treatment plan. Pt being monitored Q 15 minutes for safety per unit protocol, remains safe on the unit

## 2023-08-09 NOTE — Progress Notes (Signed)
 Patient was pleasant. Took medications no issues.  08/08/23 2000  Psych Admission Type (Psych Patients Only)  Admission Status Voluntary  Psychosocial Assessment  Patient Complaints Depression  Eye Contact Fair  Facial Expression Sad  Affect Sullen  Speech Soft;Logical/coherent  Interaction Isolative;Minimal  Motor Activity Slow  Appearance/Hygiene Unremarkable;In scrubs  Behavior Characteristics Cooperative;Calm  Aggressive Behavior  Effect No apparent injury  Thought Process  Coherency WDL  Content WDL  Delusions None reported or observed  Perception WDL  Hallucination None reported or observed  Judgment Impaired  Confusion None  Danger to Self  Current suicidal ideation? Denies  Danger to Others  Danger to Others None reported or observed

## 2023-08-09 NOTE — Group Note (Signed)
 Date:  08/09/2023 Time:  11:20 PM  Group Topic/Focus:  Wrap-Up Group:   The focus of this group is to help patients review their daily goal of treatment and discuss progress on daily workbooks.    Participation Level:  Active  Participation Quality:  Appropriate and Attentive  Affect:  Appropriate  Cognitive:  Alert and Appropriate  Insight: Appropriate and Good  Engagement in Group:  Developing/Improving  Modes of Intervention:  Activity, Rapport Building, and Socialization  Additional Comments:     Maudell Stanbrough 08/09/2023, 11:20 PM

## 2023-08-09 NOTE — Group Note (Signed)
 Date:  08/09/2023 Time:  10:03 AM  Group Topic/Focus:  Dimensions of Wellness:   The focus of this group is to introduce the topic of wellness and discuss the role each dimension of wellness plays in total health.    Participation Level:  Did Not Attend   Isabella [name] 08/09/2023, 10:03 AM

## 2023-08-10 DIAGNOSIS — F314 Bipolar disorder, current episode depressed, severe, without psychotic features: Secondary | ICD-10-CM | POA: Diagnosis not present

## 2023-08-10 LAB — LITHIUM LEVEL: Lithium Lvl: 0.76 mmol/L (ref 0.60–1.20)

## 2023-08-10 MED ORDER — MELATONIN 5 MG PO TABS
10.0000 mg | ORAL_TABLET | Freq: Every day | ORAL | Status: DC
  Administered 2023-08-10: 10 mg via ORAL
  Filled 2023-08-10: qty 2

## 2023-08-10 MED ORDER — FLUOXETINE HCL 20 MG PO CAPS
40.0000 mg | ORAL_CAPSULE | Freq: Every day | ORAL | Status: DC
  Administered 2023-08-11 – 2023-08-13 (×3): 40 mg via ORAL
  Filled 2023-08-10 (×3): qty 2

## 2023-08-10 NOTE — Plan of Care (Signed)
  Problem: Safety: Goal: Periods of time without injury will increase Outcome: Progressing   Problem: Health Behavior/Discharge Planning: Goal: Identification of resources available to assist in meeting health care needs will improve Outcome: Progressing   Problem: Coping: Goal: Ability to verbalize frustrations and anger appropriately will improve Outcome: Progressing   Problem: Education: Goal: Verbalization of understanding the information provided will improve Outcome: Progressing   Problem: Education: Goal: Mental status will improve Outcome: Progressing

## 2023-08-10 NOTE — Progress Notes (Signed)
 Pt calm and pleasant during assessment stating to this writer that she was feeling better. Pt observed by this Clinical research associate interacting appropriately with staff and peers on the unit. Pt compliant with medication administration per MD orders. Pt given education, support, and encouragement to be active in her treatment plan. Pt being monitored Q 15 minutes for safety per unit protocol, remains safe on the un
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
## 2023-08-10 NOTE — Group Note (Signed)
 Recreation Therapy Group Note   Group Topic:Health and Wellness  Group Date: 08/10/2023 Start Time: 1530 End Time: 1645 Facilitators: Rosina Lowenstein, LRT, CTRS Location: Courtyard  Group Description: [employer]. LRT and patients played games of basketball, drew with chalk, and played corn hole while outside in the courtyard while getting fresh air and sunlight. Music was being played in the background. LRT and peers conversed about different games they have played before, what they do in their free time and anything else that is on their minds. LRT encouraged pts to drink water after being outside, sweating and getting their heart rate up.  Goal Area(s) Addressed: Patient will build on frustration tolerance skills. Patients will partake in a competitive play game with peers. Patients will gain knowledge of new leisure interest/hobby.    Affect/Mood: Appropriate   Participation Level: Non-verbal    Clinical Observations/Individualized Feedback: Isabella [name] was present outside for 10 minutes.   Plan: Continue to engage patient in RT group sessions 2-3x/week.   Rosina Lowenstein, LRT, CTRS 08/10/2023 5:26 PM

## 2023-08-10 NOTE — Plan of Care (Signed)
   Problem: Education: Goal: Emotional status will improve Outcome: Progressing Goal: Mental status will improve Outcome: Progressing   Problem: Activity: Goal: Sleeping patterns will improve Outcome: Progressing

## 2023-08-10 NOTE — Group Note (Signed)
 LCSW Group Therapy Note   Group Date: 08/10/2023 Start Time: 1300 End Time: 1400   Type of Therapy and Topic:  Group Therapy: Challenging Core Beliefs  Participation Level:  Minimal  Description of Group:  Patients were educated about core beliefs and asked to identify one harmful core belief that they have. Patients were asked to explore from where those beliefs originate. Patients were asked to discuss how those beliefs make them feel and the resulting behaviors of those beliefs. They were then be asked if those beliefs are true and, if so, what evidence they have to support them. Lastly, group members were challenged to replace those negative core beliefs with helpful beliefs.   Therapeutic Goals:   1. Patient will identify harmful core beliefs and explore the origins of such beliefs. 2. Patient will identify feelings and behaviors that result from those core beliefs. 3. Patient will discuss whether such beliefs are true. 4.  Patient will replace harmful core beliefs with helpful ones.  Summary of Patient Progress:  Patient actively engaged in processing and exploring how core beliefs are formed and how they impact thoughts, feelings, and behaviors. Patient proved open to input from peers and feedback from CSW. Patient demonstrated minimal insight into the subject matter, was respectful and supportive of peers, and participated throughout the entire session.  Therapeutic Modalities: Cognitive Behavioral Therapy; Solution-Focused Therapy   [name], Theresia Majors 08/10/2023  1:44 PM

## 2023-08-10 NOTE — Group Note (Signed)
 Recreation Therapy Group Note   Group Topic:Goal Setting  Group Date: 08/10/2023 Start Time: 1000 End Time: 1100 Facilitators: Rosina Lowenstein, LRT, CTRS Location:  Craft Room  Group Description: Product/process development scientist. Patients were given many different magazines, a glue stick, markers, and a piece of cardstock paper. LRT and pts discussed the importance of having goals in life. LRT and pts discussed the difference between short-term and long-term goals, as well as what a SMART goal is. LRT encouraged pts to create a vision board, with images they picked and then cut out with safety scissors from the magazine, for themselves, that capture their short and long-term goals. LRT encouraged pts to show and explain their vision board to the group.   Goal Area(s) Addressed:  Patient will gain knowledge of short vs. long term goals.  Patient will identify goals for themselves. Patient will practice setting SMART goals. Patient will verbalize their goals to LRT and peers.   Affect/Mood: N/A   Participation Level: Did not attend    Clinical Observations/Individualized Feedback: Patient did not attend group.   Plan: Continue to engage patient in RT group sessions 2-3x/week.   Rosina Lowenstein, LRT, CTRS 08/10/2023 11:41 AM

## 2023-08-10 NOTE — Progress Notes (Signed)
   08/10/23 1000  Psych Admission Type (Psych Patients Only)  Admission Status Involuntary  Psychosocial Assessment  Patient Complaints Other (Comment) (" Dizzy at times")  Eye Contact Fair  Facial Expression Sad  Affect Sullen  Speech Logical/coherent  Interaction Minimal  Motor Activity Slow  Appearance/Hygiene Unremarkable  Behavior Characteristics Cooperative  Mood Pleasant  Aggressive Behavior  Effect No apparent injury  Thought Process  Coherency WDL  Content WDL  Delusions None reported or observed  Perception WDL  Hallucination None reported or observed  Judgment Impaired  Confusion None  Danger to Self  Current suicidal ideation? Denies  Danger to Others  Danger to Others None reported or observed   Patient visible in the milieu,making phone calls. No dizziness verbalized at this time. Support and encouragement given.

## 2023-08-11 DIAGNOSIS — F314 Bipolar disorder, current episode depressed, severe, without psychotic features: Secondary | ICD-10-CM | POA: Diagnosis not present

## 2023-08-11 LAB — VITAMIN D 25 HYDROXY (VIT D DEFICIENCY, FRACTURES): Vit D, 25-Hydroxy: 16.12 ng/mL — ABNORMAL LOW (ref 30–100)

## 2023-08-11 LAB — FOLATE: Folate: 10.9 ng/mL (ref >5.9)

## 2023-08-11 MED ORDER — TEMAZEPAM 15 MG PO CAPS
15.0000 mg | ORAL_CAPSULE | Freq: Every day | ORAL | Status: AC
  Administered 2023-08-12 – 2023-08-13 (×2): 15 mg via ORAL
  Filled 2023-08-11 (×3): qty 1

## 2023-08-11 NOTE — Group Note (Signed)
 Recreation Therapy Group Note   Group Topic:Other  Group Date: 08/11/2023 Start Time: 1500 End Time: 1600 Facilitators: Rosina Lowenstein, LRT, CTRS Location: Courtyard  Group Description: [employer]. LRT and patients played games of basketball, drew with chalk, and played corn hole while outside in the courtyard while getting fresh air and sunlight. Music was being played in the background. LRT and peers conversed about different games they have played before, what they do in their free time and anything else that is on their minds. LRT encouraged pts to drink water after being outside, sweating and getting their heart rate up.  Goal Area(s) Addressed: Patient will build on frustration tolerance skills. Patients will partake in a competitive play game with peers. Patients will gain knowledge of new leisure interest/hobby.    Affect/Mood: N/A   Participation Level: Did not attend    Clinical Observations/Individualized Feedback: Patient did not attend group.   Plan: Continue to engage patient in RT group sessions 2-3x/week.   Rosina Lowenstein, LRT, CTRS 08/11/2023 4:33 PM
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
## 2023-08-11 NOTE — Progress Notes (Signed)
   08/11/23 0800  Psych Admission Type (Psych Patients Only)  Admission Status Voluntary (per the order, patient made voluntary status on 08/03/23)  Psychosocial Assessment  Patient Complaints Depression;Anxiety (Patient reports improvement in symptoms since yesterday.)  Eye Contact Fair  Facial Expression Sullen  Affect Sullen  Speech Logical/coherent  Interaction Minimal  Motor Activity Slow  Appearance/Hygiene Unremarkable;In scrubs  Behavior Characteristics Cooperative  Mood Pleasant  Thought Process  Coherency WDL  Content WDL  Delusions None reported or observed  Perception WDL  Hallucination None reported or observed  Judgment Impaired  Confusion None  Danger to Self  Current suicidal ideation? Denies  Agreement Not to Harm Self No  Danger to Others  Danger to Others None reported or observed
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
## 2023-08-11 NOTE — Plan of Care (Signed)

## 2023-08-11 NOTE — Plan of Care (Signed)
   Problem: Education: Goal: Emotional status will improve Outcome: Progressing Goal: Mental status will improve Outcome: Progressing   Problem: Activity: Goal: Sleeping patterns will improve Outcome: Progressing

## 2023-08-11 NOTE — [facility] Counselor (Signed)
 CSW met with pt briefly to discuss ACTT. Pt expressed interest in these services. No other concerns expressed. Contact ended without incident.  Vilma Meckel. Algis Greenhouse, MSW, LCSW, LCAS 08/11/2023 3:43 PM

## 2023-08-11 NOTE — Group Note (Signed)
 Recreation Therapy Group Note   Group Topic:Leisure Education  Group Date: 08/11/2023 Start Time: 1015 End Time: 1115 Facilitators: Clinton Gallant, CTRS Location:  Craft Room  Group Description: Leisure. Patients were given the option to choose from singing karaoke, coloring mandalas, using oil pastels, journaling, or playing with play-doh. LRT and pts discussed the meaning of leisure, the importance of participating in leisure during their free time/when they're outside of the hospital, as well as how our leisure interests can also serve as coping skills.   Goal Area(s) Addressed:  Patient will identify a current leisure interest.  Patient will learn the definition of "leisure". Patient will practice making a positive decision. Patient will have the opportunity to try a new leisure activity. Patient will communicate with peers and LRT.    Affect/Mood: N/A   Participation Level: Did not attend    Clinical Observations/Individualized Feedback: Patient did not attend group.   Plan: Continue to engage patient in RT group sessions 2-3x/week.   Rosina Lowenstein, LRT, CTRS 08/11/2023 11:44 AM

## 2023-08-11 NOTE — Group Note (Signed)
 Date:  08/11/2023 Time:  9:10 PM  Group Topic/Focus:  Wrap-Up Group:   The focus of this group is to help patients review their daily goal of treatment and discuss progress on daily workbooks.    Participation Level:  Active  Participation Quality:  Appropriate  Affect:  Appropriate  Cognitive:  Appropriate  Insight: Appropriate  Engagement in Group:  Engaged  Modes of Intervention:  Discussion and Support  Additional Comments:     Belva Crome 08/11/2023, 9:10 PM

## 2023-08-12 DIAGNOSIS — F314 Bipolar disorder, current episode depressed, severe, without psychotic features: Secondary | ICD-10-CM | POA: Diagnosis not present

## 2023-08-12 NOTE — [facility] Group Notes (Signed)
 [facility] Group Notes:  (Nursing/MHT/Case Management/Adjunct)  Date:  08/12/2023  Time:  10:07 PM  Type of Therapy:  Group Therapy  Participation Level:  Active  Participation Quality:  Appropriate and Attentive  Affect:  Appropriate  Cognitive:  Alert and Appropriate  Insight:  Appropriate  Engagement in Group:  Engaged  Modes of Intervention:  Discussion  Summary of Progress/Problems:Wrapup group about "Anger Management"  Tacy Dura 08/12/2023, 10:07 PM

## 2023-08-12 NOTE — Plan of Care (Signed)
  Problem: Education: Goal: Knowledge of [facility] Education information/materials will improve Outcome: Progressing Goal: Emotional status will improve Outcome: Progressing Goal: Mental status will improve Outcome: Progressing Goal: Verbalization of understanding the information provided will improve Outcome: Progressing   Problem: Activity: Goal: Interest or engagement in activities will improve Outcome: Progressing Goal: Sleeping patterns will improve Outcome: Progressing   Problem: Safety: Goal: Periods of time without injury will increase Outcome: Progressing

## 2023-08-12 NOTE — Progress Notes (Signed)
 Pt calm and pleasant during assessment stating to this writer that she was feeling better. Pt observed by this Clinical research associate interacting appropriately with staff and peers on the unit. Pt compliant with medication administration per MD orders. Pt given education, support, and encouragement to be active in her treatment plan. Pt being monitored Q 15 minutes for safety per unit protocol, remains safe on the unit
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
## 2023-08-12 NOTE — Plan of Care (Signed)
   Problem: Education: Goal: Emotional status will improve Outcome: Progressing Goal: Mental status will improve Outcome: Progressing   Problem: Activity: Goal: Sleeping patterns will improve Outcome: Progressing

## 2023-08-12 NOTE — Progress Notes (Signed)
   08/12/23 0844  Psych Admission Type (Psych Patients Only)  Admission Status Voluntary  Psychosocial Assessment  Patient Complaints Depression  Eye Contact Fair  Facial Expression Sad  Affect Sad  Speech Logical/coherent  Interaction Minimal  Motor Activity Slow  Appearance/Hygiene Unremarkable  Behavior Characteristics Cooperative  Mood Pleasant  Aggressive Behavior  Effect No apparent injury  Thought Process  Coherency WDL  Content WDL  Delusions None reported or observed  Perception WDL  Hallucination None reported or observed  Judgment Impaired  Confusion None  Danger to Self  Current suicidal ideation? Denies  Agreement Not to Harm Self Yes  Description of Agreement Verbal  Danger to Others  Danger to Others None reported or observed

## 2023-08-13 DIAGNOSIS — F314 Bipolar disorder, current episode depressed, severe, without psychotic features: Secondary | ICD-10-CM | POA: Diagnosis not present

## 2023-08-13 MED ORDER — FLUOXETINE HCL 20 MG PO CAPS
60.0000 mg | ORAL_CAPSULE | Freq: Every day | ORAL | Status: DC
  Administered 2023-08-14 – 2023-08-17 (×4): 60 mg via ORAL
  Filled 2023-08-13 (×4): qty 3

## 2023-08-13 MED ORDER — VITAMIN D 25 MCG (1000 UNIT) PO TABS
1000.0000 [IU] | ORAL_TABLET | Freq: Every day | ORAL | Status: DC
  Administered 2023-08-13 – 2023-08-17 (×5): 1000 [IU] via ORAL
  Filled 2023-08-13 (×5): qty 1

## 2023-08-13 MED ORDER — PALIPERIDONE PALMITATE ER 234 MG/1.5ML IM SUSY
234.0000 mg | PREFILLED_SYRINGE | Freq: Once | INTRAMUSCULAR | Status: AC
  Administered 2023-08-13: 234 mg via INTRAMUSCULAR
  Filled 2023-08-13: qty 1.5

## 2023-08-13 MED ORDER — PALIPERIDONE PALMITATE ER 156 MG/ML IM SUSY
156.0000 mg | PREFILLED_SYRINGE | Freq: Once | INTRAMUSCULAR | Status: AC
  Administered 2023-08-16: 156 mg via INTRAMUSCULAR
  Filled 2023-08-13: qty 1

## 2023-08-13 MED ORDER — LORAZEPAM 0.5 MG PO TABS
0.5000 mg | ORAL_TABLET | Freq: Two times a day (BID) | ORAL | Status: DC
  Administered 2023-08-13 – 2023-08-17 (×8): 0.5 mg via ORAL
  Filled 2023-08-13 (×8): qty 1

## 2023-08-13 MED ORDER — PALIPERIDONE PALMITATE ER 156 MG/ML IM SUSY: 156.0000 mg | PREFILLED_SYRINGE | Freq: Once | INTRAMUSCULAR | Status: DC

## 2023-08-13 NOTE — Group Note (Signed)
 Date:  08/13/2023 Time:  10:01 AM  Group Topic/Focus:  Dimensions of Wellness:   The focus of this group is to introduce the topic of wellness and discuss the role each dimension of wellness plays in total health.    Participation Level:  Minimal  Participation Quality:  Appropriate  Affect:  Appropriate  Cognitive:  Appropriate  Insight: Appropriate  Engagement in Group:  Engaged  Modes of Intervention:  Activity  Additional Comments:    Lynelle Smoke Elie Leppo 08/13/2023, 10:01 AM

## 2023-08-13 NOTE — [facility] Suicide Risk Assessment (Deleted)
 [facility] INPATIENT:  Family/Significant Other Suicide Prevention Education  Suicide Prevention Education:  Contact Attempts: Garret Reddish, [phone], Mother, (name of family member/significant other) has been identified by the patient as the family member/significant other with whom the patient will be residing, and identified as the person(s) who will aid the patient in the event of a mental health crisis.  With written consent from the patient, two attempts were made to provide suicide prevention education, prior to and/or following the patient's discharge.  We were unsuccessful in providing suicide prevention education.  A suicide education pamphlet was given to the patient to share with family/significant other.  Date and time of first attempt:08/13/23 at 2:55 pm Date and time of second attempt: second attempt needed.  Marshell Levan 08/13/2023, 2:55 PM

## 2023-08-13 NOTE — Plan of Care (Signed)
   Problem: Education: Goal: Knowledge of [facility] Education information/materials will improve Outcome: Progressing Goal: Emotional status will improve Outcome: Progressing Goal: Mental status will improve Outcome: Progressing Goal: Verbalization of understanding the information provided will improve Outcome: Progressing
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
## 2023-08-13 NOTE — [facility] Group Notes (Signed)
 [facility] Group Notes:  (Nursing/MHT/Case Management/Adjunct)  Date:  08/13/2023  Time:  9:55 PM  Type of Therapy:  Group Therapy  Participation Level:  Active  Participation Quality:  Appropriate and Attentive  Affect:  Appropriate  Cognitive:  Alert and Appropriate  Insight:  Appropriate and Good  Engagement in Group:  Engaged  Modes of Intervention:  Discussion  Summary of Progress/Problems: Wrapup group "How was your day"  Tacy Dura 08/13/2023, 9:55 PM

## 2023-08-13 NOTE — Plan of Care (Signed)
 Isabella [name] is a 48 y.o. female patient. No diagnosis found. Past Medical History:  Diagnosis Date   Bipolar disorder (HCC)    Hypertension    Migraine 07/13/2016   Schizophrenia (HCC)    Current Facility-Administered Medications  Medication Dose Route Frequency Provider Last Rate Last Admin   acetaminophen (TYLENOL) tablet 650 mg  650 mg Oral Q6H PRN Onuoha, Chinwendu V, NP   650 mg at 08/08/23 0832   alum & mag hydroxide-simeth (MAALOX/MYLANTA) 200-200-20 MG/5ML suspension 30 mL  30 mL Oral Q4H PRN Onuoha, Chinwendu V, NP       atorvastatin (LIPITOR) tablet 20 mg  20 mg Oral Daily Myriam Forehand, NP   20 mg at 08/12/23 1610   cyanocobalamin (VITAMIN B12) tablet 1,000 mcg  1,000 mcg Oral Daily Tingling, Stephanie, PA-C   1,000 mcg at 08/12/23 9604   haloperidol (HALDOL) tablet 5 mg  5 mg Oral TID PRN Onuoha, Chinwendu V, NP       And   diphenhydrAMINE (BENADRYL) capsule 50 mg  50 mg Oral TID PRN Onuoha, Chinwendu V, NP       haloperidol lactate (HALDOL) injection 5 mg  5 mg Intramuscular TID PRN Onuoha, Chinwendu V, NP       And   diphenhydrAMINE (BENADRYL) injection 50 mg  50 mg Intramuscular TID PRN Onuoha, Chinwendu V, NP       And   LORazepam (ATIVAN) injection 2 mg  2 mg Intramuscular TID PRN Onuoha, Chinwendu V, NP       haloperidol lactate (HALDOL) injection 10 mg  10 mg Intramuscular TID PRN Onuoha, Chinwendu V, NP       And   diphenhydrAMINE (BENADRYL) injection 50 mg  50 mg Intramuscular TID PRN Onuoha, Chinwendu V, NP       And   LORazepam (ATIVAN) injection 2 mg  2 mg Intramuscular TID PRN Onuoha, Chinwendu V, NP       docusate sodium (COLACE) capsule 100 mg  100 mg Oral BID Tingling, Stephanie, PA-C   100 mg at 08/10/23 0811   feeding supplement (GLUCERNA SHAKE) (GLUCERNA SHAKE) liquid 237 mL  237 mL Oral BID BM Myriam Forehand, NP   237 mL at 08/10/23 1455   FLUoxetine (PROZAC) capsule 40 mg  40 mg Oral Daily Tingling, Stephanie, PA-C   40 mg at 08/12/23 0844    lithium carbonate (LITHOBID) ER tablet 600 mg  600 mg Oral Q12H Myriam Forehand, NP   600 mg at 08/12/23 2000   LORazepam (ATIVAN) tablet 0.5 mg  0.5 mg Oral Daily Myriam Forehand, NP   0.5 mg at 08/12/23 5409   magnesium hydroxide (MILK OF MAGNESIA) suspension 30 mL  30 mL Oral Daily PRN Onuoha, Chinwendu V, NP       metFORMIN (GLUCOPHAGE) tablet 500 mg  500 mg Oral Q breakfast Myriam Forehand, NP   500 mg at 08/12/23 0844   paliperidone (INVEGA) 24 hr tablet 9 mg  9 mg Oral Daily Tingling, Stephanie, PA-C   9 mg at 08/12/23 0843   temazepam (RESTORIL) capsule 15 mg  15 mg Oral QHS Tingling, Stephanie, PA-C   15 mg at 08/12/23 2132   No Known Allergies Principal Problem:   Severe bipolar I disorder, current or most recent episode depressed (HCC) Active Problems:   Decreased appetite   Selective mutism  Blood pressure 119/78, pulse 79, temperature (!) 97 F (36.1 C), resp. rate 20, height 5\' 4"  (1.626 m), weight  73 kg, last menstrual period 07/05/2023, SpO2 98%.   Assessment & Plan: Pt. Was complian with meds. Pt. Denies SI/HI . Continue to monitor pt.   Lavar Rosenzweig B Jaimarie Rapozo 08/13/2023

## 2023-08-13 NOTE — Group Note (Signed)
 Date:  08/13/2023 Time:  10:24 AM  Group Topic/Focus:  Dimensions of Wellness:   The focus of this group is to introduce the topic of wellness and discuss the role each dimension of wellness plays in total health.    Participation Level:  Minimal  Participation Quality:  Appropriate  Affect:  Appropriate  Cognitive:  Appropriate  Insight: Appropriate  Engagement in Group:  Engaged  Modes of Intervention:  Activity  Additional Comments:    Lynelle Smoke Gillie Crisci 08/13/2023, 10:24 AM

## 2023-08-13 NOTE — Progress Notes (Signed)
   08/13/23 0947  Psych Admission Type (Psych Patients Only)  Admission Status Voluntary  Psychosocial Assessment  Patient Complaints Depression  Eye Contact Fair  Facial Expression Flat  Affect Flat  Speech Logical/coherent  Interaction Minimal  Motor Activity Slow  Appearance/Hygiene In scrubs  Behavior Characteristics Cooperative  Mood Pleasant  Thought Process  Coherency WDL  Content WDL  Delusions None reported or observed  Perception WDL  Hallucination None reported or observed  Judgment Impaired  Confusion None  Danger to Self  Current suicidal ideation? Denies  Agreement Not to Harm Self Yes  Description of Agreement verbal  Danger to Others  Danger to Others None reported or observed   Received 1st dose of invega 234mg  injection today to left deltoid and tolerated well.

## 2023-08-14 NOTE — Plan of Care (Signed)
   Problem: Education: Goal: Emotional status will improve Outcome: Progressing Goal: Mental status will improve Outcome: Progressing

## 2023-08-14 NOTE — Group Note (Signed)
 Date:  08/14/2023 Time:  5:01 PM  Group Topic/Focus:  Activity Group:  The focus of the group is to promote activity for the patients and encourage them to go outside in the courtyard for some fresh air and some exercise.   Participation Level:  Active  Participation Quality:  Appropriate  Affect:  Appropriate  Cognitive:  Appropriate  Insight: Appropriate  Engagement in Group:  Engaged  Modes of Intervention:  Activity   Mary Sella Victora Irby 08/14/2023, 5:01 PM

## 2023-08-14 NOTE — Progress Notes (Signed)
   08/14/23 0900  Psych Admission Type (Psych Patients Only)  Admission Status Voluntary  Psychosocial Assessment  Patient Complaints None  Eye Contact Fair  Facial Expression Flat  Affect Appropriate to circumstance  Speech Logical/coherent  Interaction Minimal  Motor Activity Slow  Appearance/Hygiene Unremarkable;In scrubs  Behavior Characteristics Cooperative  Mood Pleasant  Thought Process  Coherency WDL  Delusions WDL;None reported or observed  Perception WDL  Hallucination None reported or observed  Judgment Impaired  Confusion None  Danger to Self  Current suicidal ideation? Denies  Danger to Others  Danger to Others None reported or observed   Patient stated that she is feeling better. More visible in the milieu. ADLs maintained. Support and encouragement given.

## 2023-08-14 NOTE — Progress Notes (Addendum)
 Patient ID: Isabella [name], female   DOB: 03-02-1976, 48 y.o.   MRN: [id number]    Isabella [name] is a 48 year old Hispanic female with a known history of schizophrenia (catatonic type), bipolar disorder, hypertension, and type 2 diabetes mellitus, who presented to the emergency department for psychiatric evaluation. She was  accompanied by her sister Dot Lanes [phone].    Per chart review, "This is the patient's third psychiatric admission within the past six months, with the most recent discharge occurring approximately two weeks ago. According to her sister, the patient had become increasingly withdrawn since discharge, displaying catatonic-like symptoms, including mutism, refusal to eat or drink, and nonadherence to her medication regimen.The sister also reported concerns about severe constipation, noting that the patient has not had a bowel movement in approximately two weeks, although the patient denies abdominal pain, nausea, or vomiting. During assessment, the patient endorsed feeling depressed and hopeless for the past 4-5 days. She denied any recent triggering events and was unable to identify specific reasons for her emotional decline. She denied current suicidal ideation (SI), homicidal ideation (HI), or hallucinations, and reported that she attempts to take her medications daily, although missed doses were acknowledged.The patient demonstrated flat affect, limited eye contact, and reduced verbal output, though she was cooperative, alert, and oriented during the evaluation. She expressed a desire for treatment and has since begun to engage in group therapy and is ambulating within the milieu. This pattern of recurrent decompensation suggests chronic instability, possibly due to nonadherence, inadequate outpatient support, or medication ineffectiveness. Transition to long-acting injectable antipsychotic and connection with community-based services (ACT team) is planned for improved  continuity of care".    Per nursing: patient is showing improvement as evidenced by her ability to participate in group activities. She has maintained expected behavior and her sleep/appetite have improved.   Assessment: 48 year old female standing in her room. She cooperative and receptive upon approach. She is dressed in hospital scrubs with decent hygiene. Patient is alert and oriented x 4. She appears flat and depressed but denies thoughts of self-harm. She denies hallucinations and does not appear to be responding to internal stimuli.  Patient does not appear to be preoccupied. She appears healthy and well nourished. Her thought process is coherent and goal-directed. Her eye contact is fair. Speech is coherent with normal volume.  Patient reports that "I was home taking care of my mother, and became overwhelmed, now I am feeling better". Patient admits to not taking medications as directed due to focusing on family needs. Patient reports that she has had depressive symptoms for a long time but since last July, the symptoms started escalating because she lost her job "and problems came". Patient's eye contact is improved. She reports that her appetite is improved and she is sleeping normally.  Patient admits that she was having self-care deficit and  was not eating decently, which led to constipation. She is now eating better and her digestive system is functioning better. Patient reports that she had not been taking her antipsychotic medication and was focusing on her family. She denies SI/HI/AVH. She reports no medical/health acuity and there are no safety issues at home.  Patient denies pain. Denies headache/dizziness. Denies hx of seizures/syncope. Denies respiratory distress. Denies chest/back/abdominal pain. Denies muscle/joint pain.  She expressed motivation for treatment and reports no concerns with her current medication regimen.   Patient is educated about self-care and medication adherence to  manage her symptoms. She reports that she is now motivated  and will not forget about herself anymore.   Invega Sustenna 156 mg IM is to be initiated on 08/15/2023.

## 2023-08-14 NOTE — Group Note (Signed)
 Date:  08/14/2023 Time:  10:09 PM  Group Topic/Focus:  Wrap-Up Group:   The focus of this group is to help patients review their daily goal of treatment and discuss progress on daily workbooks.    Participation Level:  Minimal  Participation Quality:  Appropriate and Attentive  Affect:  Flat  Cognitive:  Alert and Lacking  Insight: Appropriate  Engagement in Group:  Improving and Lacking  Modes of Intervention:  Discussion and Orientation  Additional Comments:     Maglione,Dakarai Mcglocklin E 08/14/2023, 10:09 PM

## 2023-08-14 NOTE — Group Note (Signed)
 [facility] LCSW Group Therapy Note    Group Date: 08/14/2023 Start Time: 1315 End Time: 1415  Type of Therapy and Topic:  Group Therapy:  Overcoming Obstacles  Participation Level:  [facility] PARTICIPATION LEVEL: None  Mood:  Description of Group:   In this group patients will be encouraged to explore what they see as obstacles to their own wellness and recovery. They will be guided to discuss their thoughts, feelings, and behaviors related to these obstacles. The group will process together ways to cope with barriers, with attention given to specific choices patients can make. Each patient will be challenged to identify changes they are motivated to make in order to overcome their obstacles. This group will be process-oriented, with patients participating in exploration of their own experiences as well as giving and receiving support and challenge from other group members.  Therapeutic Goals: 1. Patient will identify personal and current obstacles as they relate to admission. 2. Patient will identify barriers that currently interfere with their wellness or overcoming obstacles.  3. Patient will identify feelings, thought process and behaviors related to these barriers. 4. Patient will identify two changes they are willing to make to overcome these obstacles:    Summary of Patient Progress   Patient came into the group home and arranged her seat as if she would remain, however, patient never sat down and left immediately after rearranging her chair.    Therapeutic Modalities:   Cognitive Behavioral Therapy Solution Focused Therapy Motivational Interviewing Relapse Prevention Therapy   Harden Mo, LCSW

## 2023-08-14 NOTE — Group Note (Signed)
 Recreation Therapy Group Note   Group Topic:Health and Wellness  Group Date: 08/14/2023 Start Time: 1100 End Time: 1200 Facilitators: Rosina Lowenstein, LRT, CTRS Location: Courtyard  Group Description: [employer]. LRT and patients played games of basketball, drew with chalk, and played corn hole while outside in the courtyard while getting fresh air and sunlight. Music was being played in the background. LRT and peers conversed about different games they have played before, what they do in their free time and anything else that is on their minds. LRT encouraged pts to drink water after being outside, sweating and getting their heart rate up.  Goal Area(s) Addressed: Patient will build on frustration tolerance skills. Patients will partake in a competitive play game with peers. Patients will gain knowledge of new leisure interest/hobby.    Affect/Mood: N/A   Participation Level: Did not attend    Clinical Observations/Individualized Feedback: Patient did not attend group.   Plan: Continue to engage patient in RT group sessions 2-3x/week.   Rosina Lowenstein, LRT, CTRS 08/14/2023 1:39 PM

## 2023-08-14 NOTE — Progress Notes (Signed)
   08/14/23 1930  Psych Admission Type (Psych Patients Only)  Admission Status Voluntary  Psychosocial Assessment  Patient Complaints None  Eye Contact Fair  Facial Expression Flat  Affect Appropriate to circumstance  Speech Logical/coherent  Interaction Minimal  Motor Activity Slow  Appearance/Hygiene Unremarkable  Behavior Characteristics Cooperative  Mood Pleasant  Thought Process  Coherency WDL  Delusions WDL  Perception WDL  Hallucination None reported or observed  Judgment Impaired  Confusion None  Danger to Self  Current suicidal ideation?  (Denies)  Agreement Not to Harm Self Yes  Description of Agreement Verbal  Danger to Others  Danger to Others None reported or observed

## 2023-08-14 NOTE — Plan of Care (Signed)
  Problem: Health Behavior/Discharge Planning: Goal: Identification of resources available to assist in meeting health care needs will improve Outcome: Progressing   Problem: Coping: Goal: Ability to verbalize frustrations and anger appropriately will improve Outcome: Progressing   Problem: Activity: Goal: Interest or engagement in activities will improve Outcome: Progressing   Problem: Education: Goal: Verbalization of understanding the information provided will improve Outcome: Progressing   Problem: Education: Goal: Knowledge of [place] [facility] Education information/materials will improve Outcome: Progressing

## 2023-08-14 NOTE — BH IP Treatment Plan (Signed)
 Interdisciplinary Treatment and Diagnostic Plan Update  08/14/2023 Time of Session: 9:00 AM Drishti Pepperman MRN: [id number]  Principal Diagnosis: Severe bipolar I disorder, current or most recent episode depressed (HCC)  Secondary Diagnoses: Principal Problem:   Severe bipolar I disorder, current or most recent episode depressed (HCC) Active Problems:   Decreased appetite   Selective mutism   Current Medications:  Current Facility-Administered Medications  Medication Dose Route Frequency Provider Last Rate Last Admin   acetaminophen (TYLENOL) tablet 650 mg  650 mg Oral Q6H PRN Onuoha, Chinwendu V, NP   650 mg at 08/08/23 0832   alum & mag hydroxide-simeth (MAALOX/MYLANTA) 200-200-20 MG/5ML suspension 30 mL  30 mL Oral Q4H PRN Onuoha, Chinwendu V, NP       atorvastatin (LIPITOR) tablet 20 mg  20 mg Oral Daily Myriam Forehand, NP   20 mg at 08/14/23 2130   cholecalciferol (VITAMIN D3) 25 MCG (1000 UNIT) tablet 1,000 Units  1,000 Units Oral Daily Tingling, Stephanie, PA-C   1,000 Units at 08/14/23 0818   cyanocobalamin (VITAMIN B12) tablet 1,000 mcg  1,000 mcg Oral Daily Tingling, Stephanie, PA-C   1,000 mcg at 08/14/23 0818   haloperidol (HALDOL) tablet 5 mg  5 mg Oral TID PRN Onuoha, Chinwendu V, NP       And   diphenhydrAMINE (BENADRYL) capsule 50 mg  50 mg Oral TID PRN Onuoha, Chinwendu V, NP       haloperidol lactate (HALDOL) injection 5 mg  5 mg Intramuscular TID PRN Onuoha, Chinwendu V, NP       And   diphenhydrAMINE (BENADRYL) injection 50 mg  50 mg Intramuscular TID PRN Onuoha, Chinwendu V, NP       And   LORazepam (ATIVAN) injection 2 mg  2 mg Intramuscular TID PRN Onuoha, Chinwendu V, NP       haloperidol lactate (HALDOL) injection 10 mg  10 mg Intramuscular TID PRN Onuoha, Chinwendu V, NP       And   diphenhydrAMINE (BENADRYL) injection 50 mg  50 mg Intramuscular TID PRN Onuoha, Chinwendu V, NP       And   LORazepam (ATIVAN) injection 2 mg  2 mg Intramuscular TID PRN  Onuoha, Chinwendu V, NP       docusate sodium (COLACE) capsule 100 mg  100 mg Oral BID Tingling, Stephanie, PA-C   100 mg at 08/14/23 0817   feeding supplement (GLUCERNA SHAKE) (GLUCERNA SHAKE) liquid 237 mL  237 mL Oral BID BM Myriam Forehand, NP   237 mL at 08/13/23 1356   FLUoxetine (PROZAC) capsule 60 mg  60 mg Oral Daily Tingling, Stephanie, PA-C   60 mg at 08/14/23 0817   lithium carbonate (LITHOBID) ER tablet 600 mg  600 mg Oral Q12H Myriam Forehand, NP   600 mg at 08/14/23 8657   LORazepam (ATIVAN) tablet 0.5 mg  0.5 mg Oral BID Tingling, Stephanie, PA-C   0.5 mg at 08/14/23 0818   magnesium hydroxide (MILK OF MAGNESIA) suspension 30 mL  30 mL Oral Daily PRN Onuoha, Chinwendu V, NP       metFORMIN (GLUCOPHAGE) tablet 500 mg  500 mg Oral Q breakfast Myriam Forehand, NP   500 mg at 08/14/23 0818   [START ON 08/16/2023] paliperidone (INVEGA SUSTENNA) injection 156 mg  156 mg Intramuscular Once Tingling, Stephanie, PA-C       paliperidone (INVEGA) 24 hr tablet 9 mg  9 mg Oral Daily Tingling, Stephanie, PA-C   9 mg at  08/14/23 0817   temazepam (RESTORIL) capsule 15 mg  15 mg Oral QHS Tingling, Stephanie, PA-C   15 mg at 08/13/23 2115   PTA Medications: Medications Prior to Admission  Medication Sig Dispense Refill Last Dose/Taking   atorvastatin (LIPITOR) 20 MG tablet Take 1 tablet (20 mg total) by mouth daily. 30 tablet 0    FLUoxetine (PROZAC) 20 MG capsule Take 3 capsules (60 mg total) by mouth daily. 90 capsule 3    hydrOXYzine (ATARAX) 25 MG tablet Take 1 tablet (25 mg total) by mouth 3 (three) times daily as needed for anxiety. 30 tablet 0    lithium carbonate (LITHOBID) 300 MG ER tablet Take 1 tablet (300 mg total) by mouth every 12 (twelve) hours. 60 tablet 0    LORazepam (ATIVAN) 0.5 MG tablet Take 1 tablet (0.5 mg total) by mouth daily. 30 tablet 0    melatonin 5 MG TABS Take 1 tablet (5 mg total) by mouth at bedtime. 30 tablet 0    metFORMIN (GLUCOPHAGE) 500 MG tablet Take 1 tablet (500 mg  total) by mouth 2 (two) times daily with a meal. 60 tablet 0    paliperidone (INVEGA) 3 MG 24 hr tablet Take 1 tablet (3 mg total) by mouth daily. 30 tablet 0    traZODone (DESYREL) 50 MG tablet Take 1 tablet (50 mg total) by mouth at bedtime as needed for sleep. 30 tablet 0     Patient Stressors: Other: depression and anxiety    Patient Strengths: Capable of independent living  Financial means  Supportive family/friends   Treatment Modalities: Medication Management, Group therapy, Case management,  1 to 1 session with clinician, Psychoeducation, Recreational therapy.   Physician Treatment Plan for Primary Diagnosis: Severe bipolar I disorder, current or most recent episode depressed (HCC) Long Term Goal(s): Improvement in symptoms so as ready for discharge   Short Term Goals: Ability to identify changes in lifestyle to reduce recurrence of condition will improve Ability to verbalize feelings will improve Ability to disclose and discuss suicidal ideas Ability to demonstrate self-control will improve Ability to identify and develop effective coping behaviors will improve Ability to maintain clinical measurements within normal limits will improve Compliance with prescribed medications will improve Ability to identify triggers associated with substance abuse/mental health issues will improve  Medication Management: Evaluate patient's response, side effects, and tolerance of medication regimen.  Therapeutic Interventions: 1 to 1 sessions, Unit Group sessions and Medication administration.  Evaluation of Outcomes: Progressing  Physician Treatment Plan for Secondary Diagnosis: Principal Problem:   Severe bipolar I disorder, current or most recent episode depressed (HCC) Active Problems:   Decreased appetite   Selective mutism  Long Term Goal(s): Improvement in symptoms so as ready for discharge   Short Term Goals: Ability to identify changes in lifestyle to reduce recurrence of  condition will improve Ability to verbalize feelings will improve Ability to disclose and discuss suicidal ideas Ability to demonstrate self-control will improve Ability to identify and develop effective coping behaviors will improve Ability to maintain clinical measurements within normal limits will improve Compliance with prescribed medications will improve Ability to identify triggers associated with substance abuse/mental health issues will improve     Medication Management: Evaluate patient's response, side effects, and tolerance of medication regimen.  Therapeutic Interventions: 1 to 1 sessions, Unit Group sessions and Medication administration.  Evaluation of Outcomes: Progressing   RN Treatment Plan for Primary Diagnosis: Severe bipolar I disorder, current or most recent episode depressed (HCC) Long Term  Goal(s): Knowledge of disease and therapeutic regimen to maintain health will improve  Short Term Goals: Ability to remain free from injury will improve, Ability to verbalize frustration and anger appropriately will improve, Ability to demonstrate self-control, Ability to participate in decision making will improve, Ability to verbalize feelings will improve, Ability to disclose and discuss suicidal ideas, Ability to identify and develop effective coping behaviors will improve, and Compliance with prescribed medications will improve   Medication Management: RN will administer medications as ordered by provider, will assess and evaluate patient's response and provide education to patient for prescribed medication. RN will report any adverse and/or side effects to prescribing provider.  Therapeutic Interventions: 1 on 1 counseling sessions, Psychoeducation, Medication administration, Evaluate responses to treatment, Monitor vital signs and CBGs as ordered, Perform/monitor CIWA, COWS, AIMS and Fall Risk screenings as ordered, Perform wound care treatments as ordered.  Evaluation of  Outcomes: Progressing   LCSW Treatment Plan for Primary Diagnosis: Severe bipolar I disorder, current or most recent episode depressed (HCC) Long Term Goal(s): Safe transition to appropriate next level of care at discharge, Engage patient in therapeutic group addressing interpersonal concerns.  Short Term Goals: Engage patient in aftercare planning with referrals and resources, Increase social support, Increase ability to appropriately verbalize feelings, Increase emotional regulation, Facilitate acceptance of mental health diagnosis and concerns, and Increase skills for wellness and recovery   Therapeutic Interventions: Assess for all discharge needs, 1 to 1 time with Social worker, Explore available resources and support systems, Assess for adequacy in community support network, Educate family and significant other(s) on suicide prevention, Complete Psychosocial Assessment, Interpersonal group therapy.  Evaluation of Outcomes: Progressing   Progress in Treatment: Attending groups: Yes. 08/14/23 Update: Yes. And No.  Participating in groups: Yes. and No. 08/14/23 Update: Yes. And No.  Taking medication as prescribed: Yes. 08/14/23 Update: No.  Toleration medication: Yes. 08/14/23 Update: No.  Family/Significant other contact made: No, will contact: mom, Fanciso Novas. 08/14/23 Update: Education Completed; Fanciso Novas/mother ([phone]), has been identified by the patient as the family member/significant other with whom the patient will be residing, and identified as the person(s) who will aid the patient in the event of a mental health crisis (suicidal ideations/suicide attempt).  With written consent from the patient, the family member/significant other has been provided the following suicide prevention education, prior to the and/or following the discharge of the patient.  Patient understands diagnosis: Yes. Discussing patient identified problems/goals with staff: Yes. Medical problems  stabilized or resolved: Yes. 08/14/23 Update: Yes. And No.  Denies suicidal/homicidal ideation: Yes. Issues/concerns per patient self-inventory: No. Other: none 08/14/23 Update: none   New problem(s) identified: No, Describe:  none Update 08/09/23: No changes at this time. 08/14/23 Update: No changes at this time.   New Short Term/Long Term Goal(s):detox, elimination of symptoms of psychosis, medication management for mood stabilization; elimination of SI thoughts; development of comprehensive mental wellness/sobriety plan. Update 08/09/23: No changes at this time.  08/14/23 Update: No changes at this time.   Patient Goals:   "try to do my best" Update 08/09/23: No changes at this time. 08/14/23 Update: No changes at this time.   Discharge Plan or Barriers: Patient reports plans to return to her home.  She reports that she has not been consistent with aftercare plans.  It is recommended that patient have an ACT team. Update 08/09/23: No changes at this time. 08/14/23 Update: No changes at this time.    Reason for Continuation of Hospitalization: Anxiety Depression  Medication stabilization  Estimated Length of Stay: 1-7 days Update 08/09/23: TBD.  08/14/23 Update: TBD  Last 3 [place] Suicide Severity Risk Score: Flowsheet Row Admission (Current) from 08/03/2023 in [facility]PAT[facility] MED[facility]INE Admission (Discharged) from 07/17/2023 in [facility] INPATIENT BEHAVIORAL MEDICINE ED from 07/16/2023 in [facility] Emergency Department at [facility]  C-SSRS RISK CATEGORY No Risk No Risk No Risk       Last PHQ 2/9 Scores:    07/16/2023   11:42 PM  Depression screen PHQ 2/9  Decreased Interest 1  Down, Depressed, Hopeless 1  PHQ - 2 Score 2  Altered sleeping 1  Tired, decreased energy 1  Change in appetite 1  Feeling bad or failure about yourself  1  Trouble concentrating 1  Moving slowly or fidgety/restless 1  Suicidal thoughts 0  PHQ-9 Score 8    Scribe for Treatment Team: [name],  LCSW 08/14/2023 2:11 PM

## 2023-08-14 NOTE — Plan of Care (Signed)

## 2023-08-15 NOTE — Progress Notes (Signed)
   08/15/23 0900  Psych Admission Type (Psych Patients Only)  Admission Status Voluntary  Psychosocial Assessment  Patient Complaints None  Eye Contact Fair  Facial Expression Flat  Affect Appropriate to circumstance  Speech Logical/coherent  Interaction Minimal  Motor Activity Slow  Appearance/Hygiene Unremarkable  Behavior Characteristics Cooperative  Mood Pleasant  Thought Process  Coherency WDL  Content WDL  Delusions None reported or observed  Perception WDL  Hallucination None reported or observed  Judgment Impaired  Confusion None  Danger to Self  Current suicidal ideation? Denies  Agreement Not to Harm Self Yes  Description of Agreement verbal  Danger to Others  Danger to Others None reported or observed

## 2023-08-15 NOTE — Group Note (Unsigned)
 Date:  08/15/2023 Time:  8:57 PM  Group Topic/Focus:  Wrap-Up Group:   The focus of this group is to help patients review their daily goal of treatment and discuss progress on daily workbooks.     Participation Level:  {BHH PARTICIPATION VWUJW:11914}  Participation Quality:  {BHH PARTICIPATION QUALITY:22265}  Affect:  {BHH AFFECT:22266}  Cognitive:  {BHH COGNITIVE:22267}  Insight: {BHH Insight2:20797}  Engagement in Group:  {BHH ENGAGEMENT IN NWGNF:62130}  Modes of Intervention:  {BHH MODES OF INTERVENTION:22269}  Additional Comments:  ***  Belva Crome 08/15/2023, 8:57 PM

## 2023-08-15 NOTE — Group Note (Signed)
 [facility] LCSW Group Therapy Note   Group Date: 08/15/2023 Start Time: 1300 End Time: 1330  Type of Therapy/Topic:  Group Therapy:  Feelings about Diagnosis  Participation Level:  None    Description of Group:    This group will allow patients to explore their thoughts and feelings about diagnoses they have received. Patients will be guided to explore their level of understanding and acceptance of these diagnoses. Facilitator will encourage patients to process their thoughts and feelings about the reactions of others to their diagnosis, and will guide patients in identifying ways to discuss their diagnosis with significant others in their lives. This group will be process-oriented, with patients participating in exploration of their own experiences as well as giving and receiving support and challenge from other group members.   Therapeutic Goals: 1. Patient will demonstrate understanding of diagnosis as evidence by identifying two or more symptoms of the disorder:  2. Patient will be able to express two feelings regarding the diagnosis 3. Patient will demonstrate ability to communicate their needs through discussion and/or role plays  Summary of Patient Progress: Patient was present in group for the icebreaker in which she identified her favorite holiday as Christmas. However pt left after this as she received a phone call.    Therapeutic Modalities:   Cognitive Behavioral Therapy Brief Therapy Feelings Identification    Glenis Smoker, LCSW

## 2023-08-15 NOTE — Progress Notes (Cosign Needed Addendum)
 Patient ID: Isabella [name], female   DOB: 1976/04/07, 48 y.o.   MRN: [id number] Isabella [name] is a 48 year-old female admitted to [facility] on  08/03/2023 with a hx of Schizophrenia , catatonic type, Bipolar Disorder, HTN and type 2 DM. She presented to the ER accompanied by her sister and seeking psychiatric evaluation. Patient presented with a hx of multiple psychiatric treatments, the last one being early March 2025. According to her sister, the patient had become increasingly withdrawn since discharge, displaying catatonic-like symptoms, including mutism, refusal to eat or drink, and nonadherence to her medication regimen.The sister also reported concerns about severe constipation, noting that the patient had not had a bowel movement in approximately two weeks, although the patient denied abdominal pain, nausea, or vomiting.  Patient's denied any recent triggers. Patient was admitted and treatment was initiated. Per chart review and nursing report, patient is showing improvement as she is often visible in the milieu, taking medications and  participating in activities.  Patient is evaluated face-to face. 48 year-old female sitting in her bed. She is calm and cooperative. She appears flat and depressed but reports "I feel better". She is casually dressed with improved hygiene.  Alert and oriented x 4. She does not appear preoccupied. Her eye contact is fair and thought process is coherent. Patient reports that her sleep is improving and she is able to engage in activities with peers. Patient admits that she had an episode of dissociation in which she did not feel like doing anything. She reports that this happens every time she gets overwhelmed and does not take medications. Patient is engaged in this assessment and communicates appropriately. Her speech is clear and  she remains focused on the topic. Patient admits that she was not taking medications and was not performing good  hygiene.  She was not eating or sleeping well. She denies hallucinations, SI, HI. She denies pain/discomfort. She reports no issue about her medications.  Her goal is to become stable enough and to improve on self-care.   Patient continues to show improvement in mood and her thought process is improving. She is encouraged to continue taking medications as prescribe and to participate in group activities as tolerated. We will continue with safety monitoring, daily assessment  along with treatment as planned.

## 2023-08-15 NOTE — Group Note (Signed)
 Date:  08/15/2023 Time:  10:13 AM  Group Topic/Focus:  Wellness Toolbox:   The focus of this group is to discuss various aspects of wellness, balancing those aspects and exploring ways to increase the ability to experience wellness.  Patients will create a wellness toolbox for use upon discharge.    Participation Level:  Did Not Attend   Lynelle Smoke [facility] 08/15/2023, 10:13 AM

## 2023-08-15 NOTE — Group Note (Signed)
 Recreation Therapy Group Note   Group Topic:Self-Esteem  Group Date: 08/15/2023 Start Time: 1000 End Time: 1045 Facilitators: Rosina Lowenstein, LRT, CTRS Location:  [facility]  Group Description: Positive Affirmation Worksheet. Patients and LRT discussed the importance of self-love/self-esteem and things that cause it to fluctuate, including our mental health. Pts then completed a worksheet that helps them identify 24 different strengths and qualities about themselves. Pt encouraged to read aloud at least 3 off their sheet to the group. Afterwards, LRT and pts play positive affirmation bingo. LRT and pts discussed how this can be applied to daily life post-discharge.   Goal Area(s) Addressed: Patient will identify positive qualities about themselves. Patient will learn new positive affirmations.  Patient will recite positive qualities and affirmations aloud to the group.  Patient will practice positive self-talk.  Patient will increase communication.   Affect/Mood: N/A   Participation Level: Did not attend    Clinical Observations/Individualized Feedback: Patient did not attend group.   Plan: Continue to engage patient in RT group sessions 2-3x/week.   Rosina Lowenstein, LRT, CTRS 08/15/2023 1:24 PM

## 2023-08-15 NOTE — Group Note (Signed)
 Date:  08/15/2023 Time:  10:26 PM  Group Topic/Focus:  Wrap-Up Group:   The focus of this group is to help patients review their daily goal of treatment and discuss progress on daily workbooks.    Participation Level:  Active  Participation Quality:  Appropriate, Attentive, Sharing, and Supportive  Affect:  Appropriate  Cognitive:  Appropriate  Insight: Appropriate  Engagement in Group:  Engaged and Supportive  Modes of Intervention:  Discussion and Support  Additional Comments:     Belva Crome 08/15/2023, 10:26 PM

## 2023-08-15 NOTE — Group Note (Signed)
 Date:  08/15/2023 Time:  10:08 AM  Group Topic/Focus:  Goals Group:   The focus of this group is to help patients establish daily goals to achieve during treatment and discuss how the patient can incorporate goal setting into their daily lives to aide in recovery.    Participation Level:  Minimal  Participation Quality:  Appropriate  Affect:  Appropriate  Cognitive:  Appropriate  Insight: Appropriate  Engagement in Group:  Limited  Modes of Intervention:  Activity  Additional Comments:    Lynelle Smoke Sherise Geerdes 08/15/2023, 10:08 AM

## 2023-08-15 NOTE — Plan of Care (Signed)
  Problem: Physical Regulation: Goal: Ability to maintain clinical measurements within normal limits will improve Outcome: Progressing   Problem: Health Behavior/Discharge Planning: Goal: Compliance with treatment plan for underlying cause of condition will improve Outcome: Progressing   Problem: Health Behavior/Discharge Planning: Goal: Identification of resources available to assist in meeting health care needs will improve Outcome: Progressing   Problem: Coping: Goal: Ability to verbalize frustrations and anger appropriately will improve Outcome: Progressing   Problem: Education: Goal: Verbalization of understanding the information provided will improve Outcome: Progressing   Problem: Education: Goal: Mental status will improve Outcome: Progressing   Problem: Education: Goal: Emotional status will improve Outcome: Progressing

## 2023-08-15 NOTE — Group Note (Signed)
 Date:  08/15/2023 Time:  4:14 PM  Group Topic/Focus:  Wellness Toolbox:   The focus of this group is to discuss various aspects of wellness, balancing those aspects and exploring ways to increase the ability to experience wellness.  Patients will create a wellness toolbox for use upon discharge.    Participation Level:  Minimal  Participation Quality:  Appropriate  Affect:  Appropriate  Cognitive:  Appropriate  Insight: Appropriate  Engagement in Group:  Limited  Modes of Intervention:  Activity  Additional Comments:    Wilford Corner 08/15/2023, 4:14 PM

## 2023-08-15 NOTE — [facility] Counselor (Addendum)
 CSW has completed referral for [facility].  No response from [facility] at this time.  [facility] does not service that area.  [facility] recommended CSW reach out to [facility].    [name], MSW, LCSW 08/15/2023 3:49 PM    CSW has reached out to the following for ACT services  [facility]:  HIPAA compliant voicemail left [facility]:HIPAA compliant voicemail left  [name], MSW, LCSW 08/15/2023 3:02 PM

## 2023-08-16 DIAGNOSIS — F314 Bipolar disorder, current episode depressed, severe, without psychotic features: Secondary | ICD-10-CM | POA: Diagnosis not present

## 2023-08-16 NOTE — [facility] Counselor (Signed)
 CSW has sent referral for ACT services to [facility], CSW received confirmation page.   [name], MSW, LCSW 08/16/2023 3:44 PM
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
## 2023-08-16 NOTE — Plan of Care (Signed)
  Problem: Activity: Goal: Interest or engagement in activities will improve Outcome: Progressing Goal: Sleeping patterns will improve Outcome: Progressing   Problem: Coping: Goal: Ability to verbalize frustrations and anger appropriately will improve Outcome: Progressing   Problem: Safety: Goal: Periods of time without injury will increase Outcome: Progressing

## 2023-08-16 NOTE — Group Note (Signed)
 LCSW Group Therapy Note   Group Date: 08/16/2023 Start Time: 1300 End Time: 1422   Type of Therapy and Topic:  Group Therapy: Challenging Core Beliefs  Participation Level:  Minimal  Description of Group:  Patients were educated about core beliefs and asked to identify one harmful core belief that they have. Patients were asked to explore from where those beliefs originate. Patients were asked to discuss how those beliefs make them feel and the resulting behaviors of those beliefs. They were then be asked if those beliefs are true and, if so, what evidence they have to support them. Lastly, group members were challenged to replace those negative core beliefs with helpful beliefs.   Therapeutic Goals:   1. Patient will identify harmful core beliefs and explore the origins of such beliefs. 2. Patient will identify feelings and behaviors that result from those core beliefs. 3. Patient will discuss whether such beliefs are true. 4.  Patient will replace harmful core beliefs with helpful ones.  Summary of Patient Progress:  Patient actively engaged in processing and exploring how core beliefs are formed and how they impact thoughts, feelings, and behaviors. Patient proved open to input from peers and feedback from CSW. Patient demonstrated proficient insight into the subject matter, was respectful and supportive of peers, and participated throughout the entire session.  Therapeutic Modalities: Cognitive Behavioral Therapy; Solution-Focused Therapy   Isabella [name] 08/16/2023  2:43 PM

## 2023-08-16 NOTE — Progress Notes (Signed)
   08/16/23 2000  Psych Admission Type (Psych Patients Only)  Admission Status Voluntary  Psychosocial Assessment  Patient Complaints None  Eye Contact Fair  Facial Expression Flat  Affect Appropriate to circumstance  Speech Logical/coherent  Interaction Minimal  Motor Activity Slow  Appearance/Hygiene Unremarkable  Behavior Characteristics Cooperative  Mood Pleasant  Aggressive Behavior  Effect No apparent injury  Thought Process  Coherency WDL  Content WDL  Delusions None reported or observed  Perception WDL  Hallucination None reported or observed  Judgment Impaired  Confusion None  Danger to Self  Current suicidal ideation? Denies  Agreement Not to Harm Self Yes  Description of Agreement verbal  Danger to Others  Danger to Others None reported or observed   Patient presents with flat affect but brightens on approach. Denies SI, Hi, AVH. Medication compliant. Visible in milieu with minimal interaction with staff and peers. Did attend group. No complaints or concerns voiced. Encouragement and support provided. Safety checks maintained. Medications given as prescribed. Pt receptive and remains safe on unit with q 15 min checks.

## 2023-08-16 NOTE — Group Note (Signed)
 Date:  08/16/2023 Time:  7:23 PM  Group Topic/Focus:  Activity Group: The focus of the group is to promote activity for the patients and encourage them to go outside in the courtyard for exercise and fresh air.    Participation Level:  Active  Participation Quality:  Appropriate  Affect:  Appropriate  Cognitive:  Appropriate  Insight: Appropriate  Engagement in Group:  Engaged  Modes of Intervention:  Activity  Additional Comments:    Mary Sella Talesha Ellithorpe 08/16/2023, 7:23 PM

## 2023-08-16 NOTE — Progress Notes (Signed)
   08/15/23 2300  Psych Admission Type (Psych Patients Only)  Admission Status Voluntary  Psychosocial Assessment  Patient Complaints None  Eye Contact Fair  Facial Expression Flat  Affect Appropriate to circumstance  Speech Logical/coherent  Interaction Minimal  Motor Activity Slow  Appearance/Hygiene Unremarkable  Behavior Characteristics Cooperative  Mood Pleasant  Thought Process  Coherency WDL  Content WDL  Delusions None reported or observed;WDL  Perception WDL  Hallucination None reported or observed  Judgment Impaired  Confusion None  Danger to Self  Current suicidal ideation? Denies (Denies)  Agreement Not to Harm Self Yes  Description of Agreement Verbal  Danger to Others  Danger to Others Reported or observed

## 2023-08-16 NOTE — Progress Notes (Signed)
   08/16/23 0825  Psych Admission Type (Psych Patients Only)  Admission Status Voluntary  Psychosocial Assessment  Patient Complaints None  Eye Contact Fair  Facial Expression Flat  Affect Appropriate to circumstance  Speech Logical/coherent  Interaction Minimal  Motor Activity Slow  Appearance/Hygiene Unremarkable  Behavior Characteristics Cooperative  Mood Pleasant  Thought Process  Coherency WDL  Content WDL  Delusions None reported or observed  Perception WDL  Hallucination None reported or observed  Judgment Impaired  Confusion None  Danger to Self  Current suicidal ideation? Denies  Agreement Not to Harm Self Yes  Description of Agreement verbal  Danger to Others  Danger to Others Reported or observed

## 2023-08-16 NOTE — Plan of Care (Signed)
  Problem: Education: Goal: Verbalization of understanding the information provided will improve Outcome: Progressing   Problem: Activity: Goal: Sleeping patterns will improve Outcome: Progressing   Problem: Safety: Goal: Periods of time without injury will increase Outcome: Progressing

## 2023-08-16 NOTE — Plan of Care (Signed)
 ?  Problem: Education: ?Goal: Mental status will improve ?Outcome: Progressing ?Goal: Verbalization of understanding the information provided will improve ?Outcome: Progressing ?  ?

## 2023-08-16 NOTE — Group Note (Signed)
 Date:  08/16/2023 Time:  9:24 PM  Group Topic/Focus:  Wrap-Up Group:   The focus of this group is to help patients review their daily goal of treatment and discuss progress on daily workbooks.    Participation Level:  Attend  Participation Quality:  Appropriate  Affect:  Appropriate  Cognitive:  Appropriate  Insight: Appropriate  Engagement in Group:  Engaged  Modes of Intervention:  Discussion  Isabella [name] 08/16/2023, 9:24 PM

## 2023-08-17 DIAGNOSIS — F314 Bipolar disorder, current episode depressed, severe, without psychotic features: Secondary | ICD-10-CM | POA: Diagnosis not present

## 2023-08-17 MED ORDER — LITHIUM CARBONATE ER 300 MG PO TBCR: 600.0000 mg | EXTENDED_RELEASE_TABLET | Freq: Two times a day (BID) | ORAL | 0 refills | Status: DC

## 2023-08-17 MED ORDER — METFORMIN HCL 500 MG PO TABS: 500.0000 mg | ORAL_TABLET | Freq: Every day | ORAL | 0 refills | Status: DC

## 2023-08-17 MED ORDER — VITAMIN D3 25 MCG PO TABS
1000.0000 [IU] | ORAL_TABLET | Freq: Every day | ORAL | 0 refills | Status: AC
Start: 2023-08-18 — End: ?

## 2023-08-17 MED ORDER — LORAZEPAM 0.5 MG PO TABS: 0.5000 mg | ORAL_TABLET | Freq: Two times a day (BID) | ORAL | 0 refills | Status: DC

## 2023-08-17 MED ORDER — CYANOCOBALAMIN 1000 MCG PO TABS: 1000.0000 ug | ORAL_TABLET | Freq: Every day | ORAL | 0 refills | Status: AC

## 2023-08-17 NOTE — Group Note (Signed)
 Date:  08/17/2023 Time:  9:51 AM  Group Topic/Focus:  Self Care:   The focus of this group is to help patients understand the importance of self-care in order to improve or restore emotional, physical, spiritual, interpersonal, and financial health.    Participation Level:  Active  Participation Quality:  Appropriate  Affect:  Appropriate  Cognitive:  Appropriate  Insight: Appropriate  Engagement in Group:  Engaged  Modes of Intervention:  Activity  Additional Comments:    Isabella [name] 08/17/2023, 9:51 AM
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
## 2023-08-17 NOTE — Progress Notes (Signed)
  [facility] Adult Case Management Discharge Plan :  Will you be returning to the same living situation after discharge:  Yes,  patient to return home.  At discharge, do you have transportation home?: Yes,  CSW to arrange taxi services on patient's behalf.  Do you have the ability to pay for your medications:  Yes, [place] MEDICAID PREPAID HEALTH PLAN / [place] MEDICAID [facility] COMMUNITY   Release of information consent forms completed and in the chart;  Patient's signature needed at discharge.  Patient to Follow up at:  Follow-up Information     [facility]. Go to.   Why: For a hospital follow up, you will have to walk-In 8 AM - 3:30 PM to the outpatient facility.   You can also walk in 24 Hours in the behavioral Health urgent care. Contact information: [address] [place] [address] [phone]         [facility], [facility] Follow up.   Why: Referal for ACT services has been submitted. Contact information: [address] [place] [address] [phone]                 Next level of care provider has access to [facility] Link:no  Safety Planning and Suicide Prevention discussed: Yes,  Education Completed; Fanciso Novas/mother ([phone]), has been identified by the patient as the family member/significant other with whom the patient will be residing, and identified as the person(s) who will aid the patient in the event of a mental health crisis (suicidal ideations/suicide attempt).  With written consent from the patient, the family member/significant other has been provided the following suicide prevention education, prior to the and/or following the discharge of the patient.     Has patient been referred to the Quitline?: Patient refused referral for treatment  Patient has been referred for addiction treatment: No known substance use disorder.  [name], LCSW 08/17/2023, 9:19 AM

## 2023-08-17 NOTE — Progress Notes (Signed)
 Patient is discharging at this time. Patient is A&Ox4. Stable. Patient denies SI,HI, and A/V/H with no plan/intent. Printed AVS reviewed with and given to patient along with medications and follow up appointments. Suicide safety plan complete with copy provided to patient. Original form in designated binder. Patient verbalized all understanding. All valuables/belongings returned to patient. Patient is being transported by taxi . Patient denies any pain/discomfort. No s/s of current distress.

## 2023-08-17 NOTE — Plan of Care (Signed)
  Problem: Education: Goal: Emotional status will improve Outcome: Progressing Goal: Mental status will improve Outcome: Progressing Goal: Verbalization of understanding the information provided will improve Outcome: Progressing   Problem: Health Behavior/Discharge Planning: Goal: Compliance with treatment plan for underlying cause of condition will improve Outcome: Progressing   Problem: Safety: Goal: Periods of time without injury will increase Outcome: Progressing

## 2023-08-17 NOTE — Progress Notes (Signed)
 Patient presents calm and cooperative. Patient is med compliant and denies SI,HI, and A/V/H with no plan or intent. Patient socializing appropriately and awaiting discharge. No s/s of current distress.

## 2023-08-17 NOTE — BHH Suicide Risk Assessment (Signed)
 [facility] Discharge Suicide Risk Assessment   Principal Problem: Severe bipolar I disorder, current or most recent episode depressed (HCC) Discharge Diagnoses: Principal Problem:   Severe bipolar I disorder, current or most recent episode depressed (HCC) Active Problems:   Decreased appetite   Selective mutism   Total Time spent with patient: 30 minutes  Musculoskeletal: Strength & Muscle Tone: within normal limits Gait & Station: normal Patient leans: N/A  Psychiatric Specialty Exam  Presentation  General Appearance:  Appropriate for Environment; Casual  Eye Contact: Fair  Speech: Clear and Coherent  Speech Volume: Normal  Handedness: Right   Mood and Affect  Mood: Anxious  Duration of Depression Symptoms: Greater than two weeks  Affect: Flat   Thought Process  Thought Processes: Coherent  Descriptions of Associations:Intact  Orientation:Full (Time, Place and Person)  Thought Content:Logical  History of Schizophrenia/Schizoaffective disorder:Yes  Duration of Psychotic Symptoms:Greater than six months  Hallucinations:Hallucinations: None  Ideas of Reference:None  Suicidal Thoughts:Suicidal Thoughts: No  Homicidal Thoughts:Homicidal Thoughts: No   Sensorium  Memory: Immediate Fair; Recent Fair; Remote Fair  Judgment: Impaired  Insight: Shallow   Executive Functions  Concentration: Fair  Attention Span: Fair  Recall: [employer] of Knowledge: Fair  Language: Fair   Psychomotor Activity  Psychomotor Activity: Psychomotor Activity: Normal   Assets  Assets: Communication Skills; Desire for Improvement; Physical Health; Resilience; Social Support   Sleep  Sleep: Sleep: Fair   Physical Exam: Physical Exam ROS Blood pressure 105/64, pulse 75, temperature (!) 97.5 F (36.4 C), resp. rate (!) 22, height 5\' 4"  (1.626 m), weight 73 kg, last menstrual period 07/05/2023, SpO2 98%. Body mass index is 27.64 kg/m.  Mental  Status Per Nursing Assessment::   On Admission:  NA  Demographic Factors:  Low socioeconomic status  Loss Factors: Decrease in vocational status  Historical Factors: Prior suicide attempts  Risk Reduction Factors:   Sense of responsibility to family, Religious beliefs about death, Employed, Living with another person, especially a relative, Positive social support, Positive therapeutic relationship, and Positive coping skills or problem solving skills  Continued Clinical Symptoms:  Bipolar Disorder:   Mixed State  Cognitive Features That Contribute To Risk:  None    Suicide Risk:  Minimal: No identifiable suicidal ideation.  Patients presenting with no risk factors but with morbid ruminations; may be classified as minimal risk based on the severity of the depressive symptoms   Follow-up Information     [facility], [facility]. Go to.   Why: For a hospital follow up, you will have to walk-In 8 AM - 3:30 PM to the outpatient facility.   You can also walk in 24 Hours in the behavioral Health urgent care. Contact information: [address] [place] [address] [phone]         [facility], [facility] Follow up.   Why: Referal for ACT services has been submitted. Contact information: [address] [place] [address] [phone]                 Plan Of Care/Follow-up recommendations:  Activity:  As tolerated  Verner Chol, MD 08/17/2023, 10:25 AM

## 2023-10-09 ENCOUNTER — Other Ambulatory Visit: Payer: Self-pay

## 2023-10-09 ENCOUNTER — Emergency Department
Admission: EM | Admit: 2023-10-09 | Discharge: 2023-10-10 | Disposition: A | Discharge status: Other Acute Inpatient Hospital | Attending: Emergency Medicine | Admitting: Emergency Medicine

## 2023-10-09 DIAGNOSIS — F319 Bipolar disorder, unspecified: Secondary | ICD-10-CM | POA: Insufficient documentation

## 2023-10-09 DIAGNOSIS — R42 Dizziness and giddiness: Secondary | ICD-10-CM | POA: Insufficient documentation

## 2023-10-09 DIAGNOSIS — F209 Schizophrenia, unspecified: Secondary | ICD-10-CM | POA: Insufficient documentation

## 2023-10-09 DIAGNOSIS — F202 Catatonic schizophrenia: Secondary | ICD-10-CM | POA: Insufficient documentation

## 2023-10-09 LAB — TROPONIN I (HIGH SENSITIVITY)
Troponin I (High Sensitivity): <2 ng/L (ref <18)
Troponin I (High Sensitivity): <2 ng/L (ref <18)

## 2023-10-09 LAB — COMPREHENSIVE METABOLIC PANEL WITH GFR
ALT: 15 U/L (ref 0–44)
AST: 19 U/L (ref 15–41)
Albumin: 3.8 g/dL (ref 3.5–5.0)
Alkaline Phosphatase: 61 U/L (ref 38–126)
Anion gap: 10 (ref 5–15)
BUN: 12 mg/dL (ref 6–20)
CO2: 25 mmol/L (ref 22–32)
Calcium: 8 mg/dL — ABNORMAL LOW (ref 8.9–10.3)
Chloride: 104 mmol/L (ref 98–111)
Creatinine, Ser: 0.48 mg/dL (ref 0.44–1.00)
GFR, Estimated: >60 mL/min (ref >60)
Glucose, Bld: 96 mg/dL (ref 70–99)
Potassium: 4 mmol/L (ref 3.5–5.1)
Sodium: 139 mmol/L (ref 135–145)
Total Bilirubin: 0.3 mg/dL (ref 0.0–1.2)
Total Protein: 7.3 g/dL (ref 6.5–8.1)

## 2023-10-09 LAB — URINALYSIS, ROUTINE W REFLEX MICROSCOPIC
Bilirubin Urine: NEGATIVE
Glucose, UA: NEGATIVE mg/dL
Hgb urine dipstick: NEGATIVE
Ketones, ur: NEGATIVE mg/dL
Leukocytes,Ua: NEGATIVE
Nitrite: NEGATIVE
Protein, ur: NEGATIVE mg/dL
Specific Gravity, Urine: 1.021 (ref 1.005–1.030)
pH: 6 (ref 5.0–8.0)

## 2023-10-09 LAB — URINE DRUG SCREEN, QUALITATIVE (ARMC ONLY)
Amphetamines, Ur Screen: NOT DETECTED
Barbiturates, Ur Screen: NOT DETECTED
Benzodiazepine, Ur Scrn: NOT DETECTED
Cannabinoid 50 Ng, Ur ~~LOC~~: NOT DETECTED
Cocaine Metabolite,Ur ~~LOC~~: NOT DETECTED
MDMA (Ecstasy)Ur Screen: NOT DETECTED
Methadone Scn, Ur: NOT DETECTED
Opiate, Ur Screen: NOT DETECTED
Phencyclidine (PCP) Ur S: NOT DETECTED
Tricyclic, Ur Screen: NOT DETECTED

## 2023-10-09 LAB — ETHANOL: Alcohol, Ethyl (B): <15 mg/dL (ref <15)

## 2023-10-09 LAB — CBG MONITORING, ED: Glucose-Capillary: 103 mg/dL — ABNORMAL HIGH (ref 70–99)

## 2023-10-09 LAB — CBC WITH DIFFERENTIAL/PLATELET
Abs Immature Granulocytes: 0.02 10*3/uL (ref 0.00–0.07)
Basophils Absolute: 0 10*3/uL (ref 0.0–0.1)
Basophils Relative: 1 %
Eosinophils Absolute: 0.4 10*3/uL (ref 0.0–0.5)
Eosinophils Relative: 5 %
HCT: 45 % (ref 36.0–46.0)
Hemoglobin: 14.9 g/dL (ref 12.0–15.0)
Immature Granulocytes: 0 %
Lymphocytes Relative: 24 %
Lymphs Abs: 2.1 10*3/uL (ref 0.7–4.0)
MCH: 31.4 pg (ref 26.0–34.0)
MCHC: 33.1 g/dL (ref 30.0–36.0)
MCV: 94.9 fL (ref 80.0–100.0)
Monocytes Absolute: 0.5 10*3/uL (ref 0.1–1.0)
Monocytes Relative: 5 %
Neutro Abs: 5.7 10*3/uL (ref 1.7–7.7)
Neutrophils Relative %: 65 %
Platelets: 330 10*3/uL (ref 150–400)
RBC: 4.74 MIL/uL (ref 3.87–5.11)
RDW: 12.1 % (ref 11.5–15.5)
WBC: 8.8 10*3/uL (ref 4.0–10.5)
nRBC: 0 % (ref 0.0–0.2)

## 2023-10-09 LAB — LITHIUM LEVEL: Lithium Lvl: <0.06 mmol/L — ABNORMAL LOW (ref 0.60–1.20)

## 2023-10-09 LAB — SALICYLATE LEVEL: Salicylate Lvl: <7 mg/dL — ABNORMAL LOW (ref 7.0–30.0)

## 2023-10-09 LAB — ACETAMINOPHEN LEVEL: Acetaminophen (Tylenol), Serum: <10 ug/mL — ABNORMAL LOW (ref 10–30)

## 2023-10-09 LAB — POC URINE PREG, ED: Preg Test, Ur: NEGATIVE

## 2023-10-09 LAB — LIPASE, BLOOD: Lipase: 32 U/L (ref 11–51)

## 2023-10-09 MED ORDER — SODIUM CHLORIDE 0.9 % IV BOLUS
1000.0000 mL | Freq: Once | INTRAVENOUS | Status: AC
  Administered 2023-10-09: 1000 mL via INTRAVENOUS

## 2023-10-09 NOTE — ED Notes (Signed)
 Patient's family, Emboly, took patient belongings at this time. Patient dressed out into hospital-approved clothing.
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
## 2023-10-09 NOTE — BH Assessment (Signed)
 Comprehensive Clinical Assessment (CCA) Note  10/09/2023 Isabella [name] [id number]  Chief Complaint: Patient is a 48 year old female presenting to [facility] ED voluntarily. Per triage note Pt c/o feeling dizzy x1 day. Pt denies n/v. Pt reports dizziness is worse with standing. Pt reports this has happened before but she did not seek medical attention. Pt endorses mild chest pain that started today as well. During assessment patient appears alert and oriented x4, calm and cooperative, mood appears depressed. Patient reports "I was brought in my by my sister in law because I was feeling nauseas. I don't feel okay, I feel dizzy and I don't feel okay mentally." When asked if the patient takes any medications she reports "I've been taking medicine but sometimes I forget to take it, I haven't been taking them every day." Patient reports 1 week ago experiencing AH/VH. Patient also reports no appetite and no sleep for "1-2 weeks." Patient's most recent hospitalization at [facility] was from 3/20-4/3 with similar presentation. Patient also does not have a current outpatient provider due to transportation issues. Patient denies current SI/HI/AH/VH Chief Complaint  Patient presents with   Dizziness   Visit Diagnosis: Bipolar disorder    CCA Screening, Triage and Referral (STR)  Patient Reported Information How did you hear about us ? Self  Referral name: No data recorded Referral phone number: No data recorded  Whom do you see for routine medical problems? No data recorded Practice/Facility Name: No data recorded Practice/Facility Phone Number: No data recorded Name of Contact: No data recorded Contact Number: No data recorded Contact Fax Number: No data recorded Prescriber Name: No data recorded Prescriber Address (if known): No data recorded  What Is the Reason for Your Visit/Call Today? Pt c/o feeling dizzy x1 day. Pt denies n/v. Pt reports dizziness is worse with standing. Pt reports this has  happened before but she did not seek medical attention. Pt endorses mild chest pain that started today as well.  How Long Has This Been Causing You Problems? > than 6 months  What Do You Feel Would Help You the Most Today? Treatment for Depression or other mood problem   Have You Recently Been in Any Inpatient Treatment (Hospital/Detox/Crisis Center/28-Day Program)? No data recorded Name/Location of Program/Hospital:No data recorded How Long Were You There? No data recorded When Were You Discharged? No data recorded  Have You Ever Received Services From [facility] Before? No data recorded Who Do You See at [facility]? No data recorded  Have You Recently Had Any Thoughts About Hurting Yourself? No  Are You Planning to Commit Suicide/Harm Yourself At This time? No   Have you Recently Had Thoughts About Hurting Someone Marigene Shoulder? No  Explanation: n/a   Have You Used Any Alcohol or Drugs in the Past 24 Hours? No  How Long Ago Did You Use Drugs or Alcohol? No data recorded What Did You Use and How Much? No data recorded  Do You Currently Have a Therapist/Psychiatrist? No  Name of Therapist/Psychiatrist: None   Have You Been Recently Discharged From Any Office Practice or Programs? No  Explanation of Discharge From Practice/Program: Pt was released from jail 2 days ago     CCA Screening Triage Referral Assessment Type of Contact: Face-to-Face  Is this Initial or Reassessment? No data recorded Date Telepsych consult ordered in [facility]:  No data recorded Time Telepsych consult ordered in [facility]:  No data recorded  Patient Reported Information Reviewed? No data recorded Patient Left Without Being Seen? No data recorded  Reason for Not Completing Assessment: No data recorded  Collateral Involvement: n/a   Does Patient Have a Court Appointed Legal Guardian? No data recorded Name and Contact of Legal Guardian: No data recorded If Minor and Not Living with Parent(s), Who has Custody?  n/a  Is CPS involved or ever been involved? Never  Is APS involved or ever been involved? Never   Patient Determined To Be At Risk for Harm To Self or Others Based on Review of Patient Reported Information or Presenting Complaint? No  Method: No Plan  Availability of Means: No access or NA  Intent: Vague intent or NA  Notification Required: No need or identified person  Additional Information for Danger to Others Potential: Active psychosis  Additional Comments for Danger to Others Potential: n/a  Are There Guns or Other Weapons in Your Home? No  Types of Guns/Weapons: n/a  Are These Weapons Safely Secured?                            No  Who Could Verify You Are Able To Have These Secured: n/a  Do You Have any Outstanding Charges, Pending Court Dates, Parole/Probation? n/a  Contacted To Inform of Risk of Harm To Self or Others: Other: Comment   Location of Assessment: [facility] ED   Does Patient Present under Involuntary Commitment? No  IVC Papers Initial File Date: No data recorded  [place] of Residence: [place]   Patient Currently Receiving the Following Services: Medication Management   Determination of Need: Emergent (2 hours)   Options For Referral: Inpatient Hospitalization     CCA Biopsychosocial Intake/Chief Complaint:  No data recorded Current Symptoms/Problems: No data recorded  Patient Reported Schizophrenia/Schizoaffective Diagnosis in Past: Yes   Strengths: Pt is receptive to recieving treatment  Preferences: No data recorded Abilities: No data recorded  Type of Services Patient Feels are Needed: No data recorded  Initial Clinical Notes/Concerns: No data recorded  Mental Health Symptoms Depression:  Change in energy/activity; Fatigue; Hopelessness; Weight gain/loss; Sleep (too much or little); Increase/decrease in appetite   Duration of Depressive symptoms: Greater than two weeks   Mania:  None   Anxiety:   None   Psychosis:   None   Duration of Psychotic symptoms: Greater than six months   Trauma:  N/A   Obsessions:  None   Compulsions:  None   Inattention:  None   Hyperactivity/Impulsivity:  None   Oppositional/Defiant Behaviors:  N/A   Emotional Irregularity:  Chronic feelings of emptiness   Other Mood/Personality Symptoms:  n/a    Mental Status Exam Appearance and self-care  Stature:  Average   Weight:  Average weight   Clothing:  Casual   Grooming:  Normal   Cosmetic use:  None   Posture/gait:  Normal   Motor activity:  Not Remarkable   Sensorium  Attention:  Normal   Concentration:  Normal   Orientation:  X5   Recall/memory:  Normal   Affect and Mood  Affect:  Flat   Mood:  Depressed   Relating  Eye contact:  Normal   Facial expression:  Responsive; Sad   Attitude toward examiner:  Cooperative   Thought and Language  Speech flow: Soft   Thought content:  Appropriate to Mood and Circumstances   Preoccupation:  None   Hallucinations:  None   Organization:  No data recorded  [employer] of Knowledge:  Average   Intelligence:  Average  Abstraction:  Functional   Judgement:  Poor   Reality Testing:  Variable   Insight:  Fair   Decision Making:  Normal   Social Functioning  Social Maturity:  Isolates   Social Judgement:  Normal   Stress  Stressors:  Illness   Coping Ability:  Exhausted   Skill Deficits:  Decision making; Self-control   Supports:  Family; Support needed     Religion: Religion/Spirituality Are You A Religious Person?: No How Might This Affect Treatment?: n/a  Leisure/Recreation: Leisure / Recreation Do You Have Hobbies?: Yes  Exercise/Diet: Exercise/Diet Do You Exercise?: No Have You Gained or Lost A Significant Amount of Weight in the Past Six Months?: No Do You Follow a Special Diet?: No Do You Have Any Trouble Sleeping?: Yes Explanation of Sleeping Difficulties: Patient reports not sleeping for  "1 week or 2"   CCA Employment/Education Employment/Work Situation: Employment / Work Situation Employment Situation: Unemployed Patient's Job has Been Impacted by Current Illness: No Has Patient ever Been in Equities trader?: No  Education: Education Did Theme park manager?: No Did You Have An Individualized Education Program (IIEP): No Did You Have Any Difficulty At [employer]?: No Patient's Education Has Been Impacted by Current Illness: No   CCA Family/Childhood History Family and Relationship History: Family history Marital status: Single Does patient have children?: Yes  Childhood History:  Childhood History By whom was/is the patient raised?: Both parents Did patient suffer any verbal/emotional/physical/sexual abuse as a child?: No Did patient suffer from severe childhood neglect?: No Has patient ever been sexually abused/assaulted/raped as an adolescent or adult?: No Was the patient ever a victim of a crime or a disaster?: No Witnessed domestic violence?: No Has patient been affected by domestic violence as an adult?: No  Child/Adolescent Assessment:     CCA Substance Use Alcohol/Drug Use: Alcohol / Drug Use Pain Medications: See MAR Prescriptions: See MAR Over the Counter: See MAR History of alcohol / drug use?: No history of alcohol / drug abuse                         ASAM's:  Six Dimensions of Multidimensional Assessment  Dimension 1:  Acute Intoxication and/or Withdrawal Potential:      Dimension 2:  Biomedical Conditions and Complications:      Dimension 3:  Emotional, Behavioral, or Cognitive Conditions and Complications:     Dimension 4:  Readiness to Change:     Dimension 5:  Relapse, Continued use, or Continued Problem Potential:     Dimension 6:  Recovery/Living Environment:     ASAM Severity Score:    ASAM Recommended Level of Treatment:     Substance use Disorder (SUD)    Recommendations for  Services/Supports/Treatments: Recommendations for Services/Supports/Treatments Recommendations For Services/Supports/Treatments: Inpatient Hospitalization, Individual Therapy, Medication Management  DSM5 Diagnoses: Patient Active Problem List   Diagnosis Date Noted   Bipolar disorder, curr episode depressed, severe, w/psychotic features (HCC) 08/03/2023   Bipolar 1 disorder, depressed, severe (HCC) 07/17/2023   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   Bipolar 1 disorder (HCC) 05/22/2023   Depressed mood 05/21/2023   MDD (major depressive disorder), recurrent episode, moderate (HCC) 05/20/2023   Severe bipolar I disorder, current or most recent episode depressed (HCC) 05/20/2023   Sepsis (HCC) 07/23/2020   HTN (hypertension) 07/22/2020   Tobacco abuse 07/22/2020   LFTs abnormal 07/21/2020   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HCC) 07/20/2020   Decreased appetite 01/10/2018   Social anxiety disorder 08/02/2016  Patient Centered Plan: Patient is on the following Treatment Plan(s):  Depression   Referrals to Alternative Service(s): Referred to Alternative Service(s):   Place:   Date:   Time:    Referred to Alternative Service(s):   Place:   Date:   Time:    Referred to Alternative Service(s):   Place:   Date:   Time:    Referred to Alternative Service(s):   Place:   Date:   Time:      @[facility]COLLABOFCARE @  [employer], LCAS-A

## 2023-10-09 NOTE — ED Triage Notes (Signed)
 Pt c/o feeling dizzy x1 day. Pt denies n/v. Pt reports dizziness is worse with standing. Pt reports this has happened before but she did not seek medical attention. Pt endorses mild chest pain that started today as well.

## 2023-10-09 NOTE — ED Provider Notes (Signed)
 [facility] Provider Note    Event Date/Time   First MD Initiated Contact with Patient 10/09/23 1550     (approximate)   History   Dizziness   HPI  Isabella [name] is a 48 y.o. female with hypertension, bipolar, schizophrenia who comes in with concerns for some dizziness.  According to family patient has had decreased eating and drinking and not taking her medications.  She reports some dizziness that is more when she stands up or she moves.  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  They deny any known falls or hitting her head.    Physical Exam   Triage Vital Signs: ED Triage Vitals  Encounter Vitals Group     BP 10/09/23 1532 127/85     Systolic BP Percentile --      Diastolic BP Percentile --      Pulse Rate 10/09/23 1532 78     Resp 10/09/23 1532 15     Temp 10/09/23 1532 97.7 F (36.5 C)     Temp Source 10/09/23 1532 Oral     SpO2 10/09/23 1532 94 %     Weight 10/09/23 1535 165 lb 5.5 oz (75 kg)     Height 10/09/23 1535 5\' 4"  (1.626 m)     Head Circumference --      Peak Flow --      Pain Score 10/09/23 1535 8     Pain Loc --      Pain Education --      Exclude from Growth Chart --     Most recent vital signs: Vitals:   10/09/23 1532  BP: 127/85  Pulse: 78  Resp: 15  Temp: 97.7 F (36.5 C)  SpO2: 94%     General: Awake, no distress.  CV:  Good peripheral perfusion.  Resp:  Normal effort.  Abd:  No distention.  Soft and nontender Other:  Cranial nerves II to XII are intact.  Equal strength in arms and legs.  Finger-to-nose intact.  Patient is able to ambulate without any ataxia.  She reports worsening dizziness with position changes. Patient has a flat affect. No signs of trauma to the head  ED Results / Procedures / Treatments   Labs (all labs  ordered are listed, but only abnormal results are displayed) Labs Reviewed  COMPREHENSIVE METABOLIC PANEL WITH GFR - Abnormal; Notable for the following components:      Result Value   Calcium  8.0 (*)    All other components within normal limits  LITHIUM  LEVEL - Abnormal; Notable for the following components:   Lithium  Lvl <0.06 (*)    All other components within normal limits  SALICYLATE LEVEL - Abnormal; Notable for the following components:   Salicylate Lvl <7.0 (*)    All other components within normal limits  ACETAMINOPHEN  LEVEL - Abnormal; Notable for the following components:   Acetaminophen  (Tylenol ), Serum <10 (*)    All other components within normal limits  URINALYSIS, ROUTINE W REFLEX MICROSCOPIC - Abnormal; Notable for the following components:   Color, Urine YELLOW (*)    APPearance HAZY (*)    All other components within normal limits  CBG MONITORING, ED - Abnormal; Notable for the following components:   Glucose-Capillary 103 (*)    All other components  within normal limits  POC URINE PREG, ED - Normal  CBC WITH DIFFERENTIAL/PLATELET  ETHANOL  URINE DRUG SCREEN, QUALITATIVE ([facility] ONLY)  LIPASE, BLOOD  TROPONIN I (HIGH SENSITIVITY)  TROPONIN I (HIGH SENSITIVITY)     EKG  My interpretation of EKG:  Normal sinus rate of 82 without any ST elevation or T wave inversions, normal intervals     PROCEDURES:  Critical Care performed: No  Procedures   MEDICATIONS ORDERED IN ED: Medications  sodium chloride  0.9 % bolus 1,000 mL (1,000 mLs Intravenous New Bag/Given 10/09/23 1617)     IMPRESSION / MDM / ASSESSMENT AND PLAN / ED COURSE  I reviewed the triage vital signs and the nursing notes.   Patient's presentation is most consistent with acute presentation with potential threat to life or bodily function.   Patient comes in with positional dizziness but suspect is more likely related to her schizophrenia and her decreased p.o. intake which she has had  previously.  Her neurological examination is reassuring I do not see any signs for posterior stroke.  She is out of the window for stroke code as symptoms started early this morning.  Her troponins are negative x 2.  Lipase normal pregnancy test negative.  Her lithium  level is undetectable consistent with patient not taking her medications.  Salicylate, Tylenol  are all negative urine without evidence of UTI.  No falls to suggest intracranial hemorrhage.  Discussed with family and they do report that she has had dizziness in the past when she has not been eating or drinking.  They do want to see psychiatry today therefore patient is medically cleared to be seen by psychiatry given concerns for recurrent catatonia, not taking patient's medications.Given this, pt medically cleared, to be dispositioned per Psych.  6:10 PM patient reports improvement after fluids with dizziness.  The patient has been placed in psychiatric observation due to the need to provide a safe environment for the patient while obtaining psychiatric consultation and evaluation, as well as ongoing medical and medication management to treat the patient's condition.  The patient has not been placed under full IVC at this time.    The patient is on the cardiac monitor to evaluate for evidence of arrhythmia and/or significant heart rate changes.      FINAL CLINICAL IMPRESSION(S) / ED DIAGNOSES   Final diagnoses:  Dizziness  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HCC)     Rx / DC Orders   ED Discharge Orders     None        Note:  This document was prepared using Dragon voice recognition software and may include unintentional dictation errors.   Lubertha Rush, MD 10/09/23 [phone]

## 2023-10-09 NOTE — ED Notes (Signed)
Snack refused. 

## 2023-10-10 ENCOUNTER — Encounter: Payer: Self-pay | Admitting: Psychiatry

## 2023-10-10 ENCOUNTER — Inpatient Hospital Stay
Admission: AD | Admit: 2023-10-10 | Discharge: 2023-10-24 | DRG: 885 | Disposition: A | Discharge status: Home / Self Care | Source: Intra-hospital | Attending: Psychiatry | Admitting: Psychiatry

## 2023-10-10 DIAGNOSIS — Z79899 Other long term (current) drug therapy: Secondary | ICD-10-CM | POA: Diagnosis not present

## 2023-10-10 DIAGNOSIS — F250 Schizoaffective disorder, bipolar type: Secondary | ICD-10-CM | POA: Diagnosis not present

## 2023-10-10 DIAGNOSIS — I10 Essential (primary) hypertension: Secondary | ICD-10-CM | POA: Diagnosis present

## 2023-10-10 DIAGNOSIS — Z833 Family history of diabetes mellitus: Secondary | ICD-10-CM

## 2023-10-10 DIAGNOSIS — F202 Catatonic schizophrenia: Secondary | ICD-10-CM | POA: Diagnosis present

## 2023-10-10 DIAGNOSIS — Z5982 Transportation insecurity: Secondary | ICD-10-CM

## 2023-10-10 DIAGNOSIS — Z604 Social exclusion and rejection: Secondary | ICD-10-CM | POA: Diagnosis present

## 2023-10-10 DIAGNOSIS — Z91148 Patient's other noncompliance with medication regimen for other reason: Secondary | ICD-10-CM | POA: Diagnosis not present

## 2023-10-10 DIAGNOSIS — Z5948 Other specified lack of adequate food: Secondary | ICD-10-CM

## 2023-10-10 DIAGNOSIS — F17210 Nicotine dependence, cigarettes, uncomplicated: Secondary | ICD-10-CM | POA: Diagnosis present

## 2023-10-10 DIAGNOSIS — F419 Anxiety disorder, unspecified: Secondary | ICD-10-CM | POA: Diagnosis present

## 2023-10-10 DIAGNOSIS — Z5941 Food insecurity: Secondary | ICD-10-CM | POA: Diagnosis not present

## 2023-10-10 DIAGNOSIS — Z7984 Long term (current) use of oral hypoglycemic drugs: Secondary | ICD-10-CM

## 2023-10-10 DIAGNOSIS — F061 Catatonic disorder due to known physiological condition: Secondary | ICD-10-CM | POA: Diagnosis not present

## 2023-10-10 DIAGNOSIS — F209 Schizophrenia, unspecified: Secondary | ICD-10-CM | POA: Diagnosis present

## 2023-10-10 MED ORDER — HALOPERIDOL LACTATE 5 MG/ML IJ SOLN: 5.0000 mg | Freq: Three times a day (TID) | INTRAMUSCULAR | Status: DC | PRN

## 2023-10-10 MED ORDER — LORAZEPAM 2 MG/ML IJ SOLN: 2.0000 mg | Freq: Three times a day (TID) | INTRAMUSCULAR | Status: DC | PRN

## 2023-10-10 MED ORDER — HALOPERIDOL 5 MG PO TABS: 5.0000 mg | ORAL_TABLET | Freq: Three times a day (TID) | ORAL | Status: DC | PRN

## 2023-10-10 MED ORDER — ATORVASTATIN CALCIUM 20 MG PO TABS
20.0000 mg | ORAL_TABLET | Freq: Every day | ORAL | Status: DC
  Administered 2023-10-11 – 2023-10-24 (×14): 20 mg via ORAL
  Filled 2023-10-10 (×14): qty 1

## 2023-10-10 MED ORDER — TRAZODONE HCL 50 MG PO TABS: 50.0000 mg | ORAL_TABLET | Freq: Every evening | ORAL | Status: DC | PRN

## 2023-10-10 MED ORDER — FLUOXETINE HCL 20 MG PO CAPS
60.0000 mg | ORAL_CAPSULE | Freq: Every day | ORAL | Status: DC
  Administered 2023-10-11 – 2023-10-24 (×14): 60 mg via ORAL
  Filled 2023-10-10 (×14): qty 3

## 2023-10-10 MED ORDER — VITAMIN B-12 1000 MCG PO TABS
1000.0000 ug | ORAL_TABLET | Freq: Every day | ORAL | Status: DC
  Administered 2023-10-11 – 2023-10-24 (×14): 1000 ug via ORAL
  Filled 2023-10-10 (×15): qty 1

## 2023-10-10 MED ORDER — DIPHENHYDRAMINE HCL 50 MG/ML IJ SOLN: 50.0000 mg | Freq: Three times a day (TID) | INTRAMUSCULAR | Status: DC | PRN

## 2023-10-10 MED ORDER — LITHIUM CARBONATE ER 300 MG PO TBCR
600.0000 mg | EXTENDED_RELEASE_TABLET | Freq: Two times a day (BID) | ORAL | Status: DC
  Administered 2023-10-10 – 2023-10-12 (×4): 600 mg via ORAL
  Filled 2023-10-10 (×4): qty 2

## 2023-10-10 MED ORDER — ALUM & MAG HYDROXIDE-SIMETH 200-200-20 MG/5ML PO SUSP: 30.0000 mL | ORAL | Status: DC | PRN

## 2023-10-10 MED ORDER — MAGNESIUM HYDROXIDE 400 MG/5ML PO SUSP: 30.0000 mL | Freq: Every day | ORAL | Status: DC | PRN

## 2023-10-10 MED ORDER — LORAZEPAM 0.5 MG PO TABS
0.5000 mg | ORAL_TABLET | Freq: Two times a day (BID) | ORAL | Status: DC
  Administered 2023-10-10 – 2023-10-24 (×28): 0.5 mg via ORAL
  Filled 2023-10-10 (×28): qty 1

## 2023-10-10 MED ORDER — HYDROXYZINE HCL 25 MG PO TABS: 25.0000 mg | ORAL_TABLET | Freq: Three times a day (TID) | ORAL | Status: DC | PRN

## 2023-10-10 MED ORDER — ACETAMINOPHEN 325 MG PO TABS
650.0000 mg | ORAL_TABLET | Freq: Four times a day (QID) | ORAL | Status: DC | PRN
Start: 2023-10-10 — End: 2023-10-24

## 2023-10-10 MED ORDER — DIPHENHYDRAMINE HCL 25 MG PO CAPS: 50.0000 mg | ORAL_CAPSULE | Freq: Three times a day (TID) | ORAL | Status: DC | PRN

## 2023-10-10 MED ORDER — HALOPERIDOL LACTATE 5 MG/ML IJ SOLN: 10.0000 mg | Freq: Three times a day (TID) | INTRAMUSCULAR | Status: DC | PRN

## 2023-10-10 MED ORDER — METFORMIN HCL 500 MG PO TABS
500.0000 mg | ORAL_TABLET | Freq: Every day | ORAL | Status: DC
  Administered 2023-10-11 – 2023-10-24 (×14): 500 mg via ORAL
  Filled 2023-10-10 (×15): qty 1

## 2023-10-10 NOTE — Group Note (Signed)
 Date:  10/10/2023 Time:  7:07 PM  Group Topic/Focus:   Structured Activity   Participation Level:  Did Not Attend   Isabella [name] A Isabella [name] 10/10/2023, 7:07 PM

## 2023-10-10 NOTE — Progress Notes (Signed)
   10/10/23 1000  Psych Admission Type (Psych Patients Only)  Admission Status Voluntary  Psychosocial Assessment  Patient Complaints None  Eye Contact Brief  Facial Expression Other (Comment) (WNL)  Affect Appropriate to circumstance  Speech Soft  Interaction Assertive  Motor Activity Other (Comment) (WNL)  Appearance/Hygiene Unremarkable  Behavior Characteristics Cooperative  Mood Pleasant  Thought Process  Coherency WDL  Content WDL  Delusions None reported or observed  Perception WDL  Hallucination None reported or observed  Judgment Impaired  Confusion WDL  Danger to Self  Current suicidal ideation? Denies  Danger to Others  Danger to Others None reported or observed

## 2023-10-10 NOTE — Progress Notes (Signed)
 On admission Pt states, "I am here for my mental health. I hear voices say do things (unspecified), go places."  Pt denied CAH to harm self, denied SI/HI.  Pt endorsed depression rated 8/10.  Anxiety rated 9/10.  Pt c/o abdominal pain - refused pain med, was ok with ginger ale.    10/10/23 0310  Psych Admission Type (Psych Patients Only)  Admission Status Voluntary  Psychosocial Assessment  Patient Complaints Depression;Anxiety  Eye Contact Brief  Facial Expression Anxious;Flat  Affect Anxious;Preoccupied  Speech Soft  Interaction Minimal  Motor Activity Restless  Appearance/Hygiene In scrubs;Unremarkable  Behavior Characteristics Cooperative;Anxious  Mood Depressed;Anxious  Thought Process  Coherency WDL  Content WDL  Delusions None reported or observed  Perception WDL  Hallucination Auditory  Judgment Limited  Confusion WDL  Danger to Self  Current suicidal ideation? Denies  Danger to Others  Danger to Others None reported or observed

## 2023-10-10 NOTE — Progress Notes (Signed)
   10/10/23 2200  Psych Admission Type (Psych Patients Only)  Admission Status Voluntary  Psychosocial Assessment  Patient Complaints None  Eye Contact Brief  Facial Expression Anxious  Affect Appropriate to circumstance  Speech Soft  Interaction Assertive  Motor Activity Pacing;Restless  Appearance/Hygiene Unremarkable  Behavior Characteristics Cooperative  Mood Anxious;Depressed  Thought Process  Coherency WDL  Content WDL  Delusions None reported or observed  Perception WDL  Hallucination None reported or observed  Judgment Impaired  Confusion WDL  Danger to Self  Current suicidal ideation? Denies  Danger to Others  Danger to Others Reported or observed  Danger to Others Abnormal  Harmful Behavior to others No threats or harm toward other people  Destructive Behavior No threats or harm toward property

## 2023-10-10 NOTE — [facility] Suicide Risk Assessment (Signed)
 [facility] Admission Suicide Risk Assessment   Nursing information obtained from:  Patient Demographic factors:  Living alone Current Mental Status:  NA Loss Factors:  Financial problems / change in socioeconomic status Historical Factors:  Family history of mental illness or substance abuse Risk Reduction Factors:  Living with another person, especially a relative  Total Time spent with patient: 30 minutes Principal Problem: <principal problem not specified> Diagnosis:  Active Problems:   Schizophrenia (HCC)  Subjective Data: Isabella [name] is a 48 y.o. female admitted: Presented to the EDfor 10/09/2023  3:47 PM for worsening psychiatric symptoms including dizziness, poor sleep and appetite, and possible early catatonia. Patient is admitted to adult psych unit with Q15 min safety monitoring. Multidisciplinary team approach is offered. Medication management; group/milieu therapy is offered.   Continued Clinical Symptoms:  Alcohol Use Disorder Identification Test Final Score (AUDIT): 0 The "Alcohol Use Disorders Identification Test", Guidelines for Use in Primary Care, Second Edition.  World Science writer [facility]). Score between 0-7:  no or low risk or alcohol related problems. Score between 8-15:  moderate risk of alcohol related problems. Score between 16-19:  high risk of alcohol related problems. Score 20 or above:  warrants further diagnostic evaluation for alcohol dependence and treatment.   CLINICAL FACTORS:   Schizophrenia:   Paranoid or undifferentiated type   Musculoskeletal: Strength & Muscle Tone: within normal limits Gait & Station: normal Patient leans: N/A  Psychiatric Specialty Exam:  Presentation  General Appearance:  Appropriate for Environment; Casual  Eye Contact: Fair  Speech: Clear and Coherent  Speech Volume: Decreased  Handedness: Right   Mood and Affect  Mood: Depressed; Hopeless  Affect: Appropriate   Thought Process  Thought  Processes: Coherent  Descriptions of Associations:Intact  Orientation:Partial  Thought Content:Logical  History of Schizophrenia/Schizoaffective disorder:Yes  Duration of Psychotic Symptoms:Greater than six months  Hallucinations:Hallucinations: None  Ideas of Reference:None  Suicidal Thoughts:Suicidal Thoughts: Yes, Passive  Homicidal Thoughts:Homicidal Thoughts: No   Sensorium  Memory: Immediate Fair; Recent Fair; Remote Fair  Judgment: Impaired  Insight: Shallow   Executive Functions  Concentration: Fair  Attention Span: Fair  Recall: [employer] of Knowledge: Fair  Language: Fair   Psychomotor Activity  Psychomotor Activity: Psychomotor Activity: Normal   Assets  Assets: Communication Skills; Desire for Improvement; Resilience   Sleep  Sleep:No data recorded   Physical Exam: Physical Exam ROS Blood pressure (!) 105/52, pulse 79, temperature (!) 97.5 F (36.4 C), resp. rate 19, height 5\' 4"  (1.626 m), weight 88 kg, last menstrual period 09/13/2023, SpO2 96%. Body mass index is 33.3 kg/m.   COGNITIVE FEATURES THAT CONTRIBUTE TO RISK:  None    SUICIDE RISK:   Minimal: No identifiable suicidal ideation.  Patients presenting with no risk factors but with morbid ruminations; may be classified as minimal risk based on the severity of the depressive symptoms  PLAN OF CARE: Patient is admitted to adult psych unit with Q15 min safety monitoring. Multidisciplinary team approach is offered. Medication management; group/milieu therapy is offered.   I certify that inpatient services furnished can reasonably be expected to improve the patient's condition.   Aurelia Blotter, MD 10/10/2023, 6:42 PM

## 2023-10-10 NOTE — Group Note (Signed)
 Recreation Therapy Group Note   Group Topic:Coping Skills  Group Date: 10/10/2023 Start Time: 1000 End Time: 1050 Facilitators: Deatrice Factor, LRT, CTRS Location: Craft Room  Group Description: Mind Map.  Patient was provided a blank template of a diagram with 32 blank boxes in a tiered system, branching from the center (similar to a bubble chart). LRT directed patients to label the middle of the diagram "Coping Skills". LRT and patients then came up with 8 different coping skills as examples. Pt were directed to record their coping skills in the 2nd tier boxes closest to the center.  Patients would then share their coping skills with the group as LRT wrote them out. LRT gave a handout of 99 different coping skills at the end of group.   Goal Area(s) Addressed: Patients will be able to define "coping skills". Patient will identify new coping skills.  Patient will increase communication.   Affect/Mood: N/A   Participation Level: Did not attend    Clinical Observations/Individualized Feedback: Patient did not attend group.   Plan: Continue to engage patient in RT group sessions 2-3x/week.   Deatrice Factor, LRT, CTRS 10/10/2023 11:57 AM

## 2023-10-10 NOTE — Plan of Care (Signed)
  Problem: Education: Goal: Emotional status will improve Outcome: Progressing Goal: Mental status will improve Outcome: Progressing   Problem: Coping: Goal: Ability to verbalize frustrations and anger appropriately will improve Outcome: Progressing   
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
## 2023-10-10 NOTE — [facility] Suicide Risk Assessment (Signed)
 [facility] INPATIENT:  Family/Significant Other Suicide Prevention Education  Suicide Prevention Education:  Contact Attempts: Nedra Ball, mother, [phone], (name of family member/significant other) has been identified by the patient as the family member/significant other with whom the patient will be residing, and identified as the person(s) who will aid the patient in the event of a mental health crisis.  With written consent from the patient, two attempts were made to provide suicide prevention education, prior to and/or following the patient's discharge.  We were unsuccessful in providing suicide prevention education.  A suicide education pamphlet was given to the patient to share with family/significant other.  Date and time of first attempt: 10/10/2023 at 2:38PM Date and time of second attempt:Second attempt is needed.  Use of interpreter service, Mariah Shines [phone].  CSW left HIPAA compliant voicemail requesting return call.   [name] 10/10/2023, 2:38 PM

## 2023-10-10 NOTE — Group Note (Signed)
 Date:  10/10/2023 Time:  9:31 PM  Group Topic/Focus:  Orientation:   The focus of this group is to educate the patient on the purpose and policies of crisis stabilization and provide a format to answer questions about their admission.  The group details unit policies and expectations of patients while admitted.    Participation Level:  Did Not Attend  Participation Quality:  none  Affect:  none  Cognitive:  none  Insight: None  Engagement in Group:  none  Modes of Intervention:  none  Additional Comments:  none   Cammi Consalvo 10/10/2023, 9:31 PM

## 2023-10-10 NOTE — Group Note (Signed)
 Date:  10/10/2023 Time:  10:06 AM  Group Topic/Focus:  Wellness Toolbox:   The focus of this group is to discuss various aspects of wellness, balancing those aspects and exploring ways to increase the ability to experience wellness.  Patients will create a wellness toolbox for use upon discharge.    Participation Level:  Active  Participation Quality:  Appropriate  Affect:  Appropriate  Cognitive:  Appropriate  Insight: Appropriate  Engagement in Group:  Engaged  Modes of Intervention:  Activity  Additional Comments:    Isabella [name] 10/10/2023, 10:06 AM

## 2023-10-10 NOTE — BHH Counselor (Signed)
 Adult Comprehensive Assessment  Patient ID: Isabella [name], female   DOB: 05-05-1976, 48 y.o.   MRN: [id number]  Information Source: Information source: Patient  Current Stressors:  Patient states their primary concerns and needs for treatment are:: "same situation, the medicine doesn't help" When asked what "same situation meant pt reported "hard time getting out of bed" Patient states their goals for this hospitilization and ongoing recovery are:: "getting better" Educational / Learning stressors: Pt denies. Employment / Job issues: Pt denies. Family Relationships: Pt denies. Financial / Lack of resources (include bankruptcy): Pt denies. Housing / Lack of housing: Pt denies. Physical health (include injuries & life threatening diseases): "hogh blood pressure" Social relationships: Pt denies. Substance abuse: Pt denies. Bereavement / Loss: Pt denies.  Living/Environment/Situation:  Living Arrangements: Parent Who else lives in the home?: "my mother and father" How long has patient lived in current situation?: "18 years" What is atmosphere in current home: Comfortable, Paramedic, Supportive  Family History:  Marital status: Separated Separated, when?: "For a couple years." Does patient have children?: Yes How many children?: 2 How is patient's relationship with their children?: "don't talk to them much"  Childhood History:  By whom was/is the patient raised?: Both parents Description of patient's relationship with caregiver when they were a child: "good" Patient's description of current relationship with people who raised him/her: "good" How were you disciplined when you got in trouble as a child/adolescent?: Unable to assess Does patient have siblings?: Yes Number of Siblings: 3 Description of patient's current relationship with siblings: "Good" Did patient suffer any verbal/emotional/physical/sexual abuse as a child?: No Did patient suffer from severe childhood  neglect?: No Has patient ever been sexually abused/assaulted/raped as an adolescent or adult?: No Was the patient ever a victim of a crime or a disaster?: No Witnessed domestic violence?: No Has patient been affected by domestic violence as an adult?: No  Education:  Highest grade of school patient has completed: "8th grade" Currently a student?: No Learning disability?: No  Employment/Work Situation:   Employment Situation: On disability Why is Patient on Disability: Unknown How Long has Patient Been on Disability: Unknown What is the Longest Time Patient has Held a Job?: "18 years" 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?: No  Financial Resources:   Financial resources: Medicaid Does patient have a Lawyer or guardian?: No  Alcohol/Substance Abuse:   What has been your use of drugs/alcohol within the last 12 months?: Pt denies. If attempted suicide, did drugs/alcohol play a role in this?: No Alcohol/Substance Abuse Treatment Hx: Denies past history Has alcohol/substance abuse ever caused legal problems?: No  Social Support System:   Patient's Community Support System: None Describe Community Support System: Pt denies. Type of faith/religion: Pt denies. How does patient's faith help to cope with current illness?: Pt denies.  Leisure/Recreation:   Do You Have Hobbies?: No  Strengths/Needs:   What is the patient's perception of their strengths?: Pt denies. Patient states they can use these personal strengths during their treatment to contribute to their recovery: Pt denies. Patient states these barriers may affect/interfere with their treatment: Pt denies. Patient states these barriers may affect their return to the community: Pt denies. Other important information patient would like considered in planning for their treatment: Pt denies.  Discharge Plan:   Currently receiving community mental health  services: Yes (From Whom) [name] Patient states concerns and preferences for aftercare planning are: Pt reports that she  would like to continue with services with [name]. Patient states they will know when they are safe and ready for discharge when: "I don't know" Does patient have access to transportation?: Yes Does patient have financial barriers related to discharge medications?: No Will patient be returning to same living situation after discharge?: Yes  Summary/Recommendations:   Summary and Recommendations (to be completed by the evaluator): Patient is a 48 year [facility] [place] [facility] [name] [name]  Patient presents to the hospital for "I don't feel okay mentally".  At initial assessment patient reported experiencing auditory hallucinations one week prior.  In assessment with this writer patient reported that "the same situation" was occurring. When asked for clarification on what that meant patient reported that "Schizophrenia" and then stated "having a hard time getting out of bed".  Patient reports that she has a hard time getting out of bed.  CSW provided patient with psychoeducation on Schizophrenia.  Patient reported that she has been medication compliant.  She reports that she would like to continue her outpatient treatment with [name].  CSW discussed a referral to an ACT team and patient declined.  Chart review indicates that this was researched in the past and no ACT teams serve the patient's area.  Patient indicated that triggers to her current mental health state has been her mental health diagnosis and a difficult time getting gout of her bed.  Recommendations include crisis stabilization, therapeutic milieu, encourage group attendance and participation, medication management for detox/mood stabilization and development of comprehensive mental wellness plan.  Isabella [name]. 10/10/2023

## 2023-10-10 NOTE — Group Note (Signed)
 [facility] LCSW Group Therapy Note   Group Date: 10/10/2023 Start Time: 1300 End Time: 1350  Type of Therapy/Topic:  Group Therapy:  Feelings about Diagnosis  Participation Level:  Did Not Attend    Description of Group:    This group will allow patients to explore their thoughts and feelings about diagnoses they have received. Patients will be guided to explore their level of understanding and acceptance of these diagnoses. Facilitator will encourage patients to process their thoughts and feelings about the reactions of others to their diagnosis, and will guide patients in identifying ways to discuss their diagnosis with significant others in their lives. This group will be process-oriented, with patients participating in exploration of their own experiences as well as giving and receiving support and challenge from other group members.   Therapeutic Goals: 1. Patient will demonstrate understanding of diagnosis as evidence by identifying two or more symptoms of the disorder:  2. Patient will be able to express two feelings regarding the diagnosis 3. Patient will demonstrate ability to communicate their needs through discussion and/or role plays  Summary of Patient Progress: Patient did not attend group.    Therapeutic Modalities:   Cognitive Behavioral Therapy Brief Therapy Feelings Identification    Randolm Butte, LCSW

## 2023-10-10 NOTE — BHH Counselor (Signed)
 Per chart review, patient was referred to ACT in the past, however, no ACT teams service the patient's county.  Shasta Deist, MSW, LCSW 10/10/2023 12:59 PM

## 2023-10-10 NOTE — Plan of Care (Signed)

## 2023-10-11 DIAGNOSIS — F202 Catatonic schizophrenia: Secondary | ICD-10-CM | POA: Diagnosis not present

## 2023-10-11 DIAGNOSIS — F061 Catatonic disorder due to known physiological condition: Secondary | ICD-10-CM | POA: Diagnosis present

## 2023-10-11 NOTE — Group Note (Signed)
 Date:  10/11/2023 Time:  4:56 PM  Group Topic/Focus:  Wellness Toolbox:   The focus of this group is to discuss various aspects of wellness, balancing those aspects and exploring ways to increase the ability to experience wellness.  Patients will create a wellness toolbox for use upon discharge.    Participation Level:  Did Not Attend   [name] [facility] 10/11/2023, 4:56 PM

## 2023-10-11 NOTE — Plan of Care (Signed)
  Problem: Education: Goal: Mental status will improve Outcome: Progressing   Problem: Health Behavior/Discharge Planning: Goal: Compliance with treatment plan for underlying cause of condition will improve Outcome: Progressing   Problem: Safety: Goal: Periods of time without injury will increase Outcome: Progressing   

## 2023-10-11 NOTE — Group Note (Signed)
 LCSW Group Therapy Note  Group Date: 10/11/2023 Start Time: 1300 End Time: 1400   Type of Therapy and Topic:  Group Therapy: Positive Affirmations  Participation Level:  Active   Description of Group:   This group addressed positive affirmation towards self and others.  Patients went around the room and identified two positive things about themselves and two positive things about a peer in the room.  Patients reflected on how it felt to share something positive with others, to identify positive things about themselves, and to hear positive things from others/ Patients were encouraged to have a daily reflection of positive characteristics or circumstances.   Therapeutic Goals: Patients will verbalize two of their positive qualities Patients will demonstrate empathy for others by stating two positive qualities about a peer in the group Patients will verbalize their feelings when voicing positive self affirmations and when voicing positive affirmations of others Patients will discuss the potential positive impact on their wellness/recovery of focusing on positive traits of self and others.  Summary of Patient Progress:   Patient actively engaged in the discussion. Patient was able to identify positive affirmations about himself as well as other group members. Patient demonstrated proficient insight into the subject matter, was respectful of peers, participated throughout the entire session.  Therapeutic Modalities:   Cognitive Behavioral Therapy Motivational Interviewing    Roselle Conner, LCSWA 10/11/2023  1:32 PM

## 2023-10-11 NOTE — Group Note (Signed)
 Date:  10/11/2023 Time:  10:26 AM  Group Topic/Focus:  Goals Group:   The focus of this group is to help patients establish daily goals to achieve during treatment and discuss how the patient can incorporate goal setting into their daily lives to aide in recovery.    Participation Level:  Did Not Attend   [name] 10/11/2023, 10:26 AM

## 2023-10-11 NOTE — BHH Group Notes (Signed)
 [facility] Group Notes:  (Nursing/MHT/Case Management/Adjunct)  Date:  10/11/2023  Time:  9:26 PM  Type of Therapy:  Group Therapy  Participation Level:  Active  Participation Quality:  Attentive  Affect:  Appropriate  Cognitive:  Alert and Appropriate  Insight:  Appropriate and Good  Engagement in Group:  Engaged  Modes of Intervention:  Discussion  Summary of Progress/Problems: pt did speak about her day  Anniece Kind 10/11/2023, 9:26 PM

## 2023-10-11 NOTE — BH IP Treatment Plan (Signed)
 Interdisciplinary Treatment and Diagnostic Plan Update  10/11/2023 Time of Session: 10:02 AM Isabella [name] MRN: [id number]  Principal Diagnosis: <principal problem not specified>  Secondary Diagnoses: Active Problems:   Schizophrenia (HCC)   Current Medications:  Current Facility-Administered Medications  Medication Dose Route Frequency Provider Last Rate Last Admin   acetaminophen  (TYLENOL ) tablet 650 mg  650 mg Oral Q6H PRN McLauchlin, Angela, NP       alum & mag hydroxide-simeth (MAALOX/MYLANTA) 200-200-20 MG/5ML suspension 30 mL  30 mL Oral Q4H PRN McLauchlin, Angela, NP       atorvastatin  (LIPITOR) tablet 20 mg  20 mg Oral Daily Jadapalle, Sree, MD   20 mg at 10/11/23 9528   cyanocobalamin  (VITAMIN B12) tablet 1,000 mcg  1,000 mcg Oral Daily Jadapalle, Sree, MD   1,000 mcg at 10/11/23 4132   haloperidol  (HALDOL ) tablet 5 mg  5 mg Oral TID PRN McLauchlin, Angela, NP       And   diphenhydrAMINE  (BENADRYL ) capsule 50 mg  50 mg Oral TID PRN McLauchlin, Angela, NP       haloperidol  lactate (HALDOL ) injection 5 mg  5 mg Intramuscular TID PRN McLauchlin, Angela, NP       And   diphenhydrAMINE  (BENADRYL ) injection 50 mg  50 mg Intramuscular TID PRN McLauchlin, Angela, NP       And   LORazepam  (ATIVAN ) injection 2 mg  2 mg Intramuscular TID PRN McLauchlin, Angela, NP       haloperidol  lactate (HALDOL ) injection 10 mg  10 mg Intramuscular TID PRN McLauchlin, Angela, NP       And   diphenhydrAMINE  (BENADRYL ) injection 50 mg  50 mg Intramuscular TID PRN McLauchlin, Angela, NP       And   LORazepam  (ATIVAN ) injection 2 mg  2 mg Intramuscular TID PRN McLauchlin, Angela, NP       FLUoxetine  (PROZAC ) capsule 60 mg  60 mg Oral Daily Jadapalle, Sree, MD   60 mg at 10/11/23 4401   hydrOXYzine  (ATARAX ) tablet 25 mg  25 mg Oral TID PRN McLauchlin, Angela, NP       lithium  carbonate (LITHOBID ) ER tablet 600 mg  600 mg Oral Q12H Jadapalle, Sree, MD   600 mg at 10/11/23 0272   LORazepam   (ATIVAN ) tablet 0.5 mg  0.5 mg Oral BID Jadapalle, Sree, MD   0.5 mg at 10/11/23 0840   magnesium  hydroxide (MILK OF MAGNESIA) suspension 30 mL  30 mL Oral Daily PRN McLauchlin, Angela, NP       metFORMIN  (GLUCOPHAGE ) tablet 500 mg  500 mg Oral Q breakfast Jadapalle, Sree, MD   500 mg at 10/11/23 5366   traZODone  (DESYREL ) tablet 50 mg  50 mg Oral QHS PRN McLauchlin, Angela, NP       PTA Medications: Medications Prior to Admission  Medication Sig Dispense Refill Last Dose/Taking   atorvastatin  (LIPITOR) 20 MG tablet Take 1 tablet (20 mg total) by mouth daily. 30 tablet 0    cholecalciferol  (CHOLECALCIFEROL ) 25 MCG tablet Take 1 tablet (1,000 Units total) by mouth daily. 30 tablet 0    cyanocobalamin  1000 MCG tablet Take 1 tablet (1,000 mcg total) by mouth daily. 30 tablet 0    FLUoxetine  (PROZAC ) 20 MG capsule Take 3 capsules (60 mg total) by mouth daily. 90 capsule 3    lithium  carbonate (LITHOBID ) 300 MG ER tablet Take 2 tablets (600 mg total) by mouth every 12 (twelve) hours. 60 tablet 0    LORazepam  (ATIVAN ) 0.5 MG tablet  Take 1 tablet (0.5 mg total) by mouth 2 (two) times daily. 30 tablet 0    metFORMIN  (GLUCOPHAGE ) 500 MG tablet Take 1 tablet (500 mg total) by mouth daily with breakfast. 30 tablet 0     Patient Stressors:    Patient Strengths:    Treatment Modalities: Medication Management, Group therapy, Case management,  1 to 1 session with clinician, Psychoeducation, Recreational therapy.   Physician Treatment Plan for Primary Diagnosis: <principal problem not specified> Long Term Goal(s): Improvement in symptoms so as ready for discharge   Short Term Goals: Ability to identify changes in lifestyle to reduce recurrence of condition will improve Ability to verbalize feelings will improve Ability to disclose and discuss suicidal ideas Ability to demonstrate self-control will improve Ability to identify and develop effective coping behaviors will improve  Medication  Management: Evaluate patient's response, side effects, and tolerance of medication regimen.  Therapeutic Interventions: 1 to 1 sessions, Unit Group sessions and Medication administration.  Evaluation of Outcomes: Not Met  Physician Treatment Plan for Secondary Diagnosis: Active Problems:   Schizophrenia (HCC)  Long Term Goal(s): Improvement in symptoms so as ready for discharge   Short Term Goals: Ability to identify changes in lifestyle to reduce recurrence of condition will improve Ability to verbalize feelings will improve Ability to disclose and discuss suicidal ideas Ability to demonstrate self-control will improve Ability to identify and develop effective coping behaviors will improve     Medication Management: Evaluate patient's response, side effects, and tolerance of medication regimen.  Therapeutic Interventions: 1 to 1 sessions, Unit Group sessions and Medication administration.  Evaluation of Outcomes: Not Met   RN Treatment Plan for Primary Diagnosis: <principal problem not specified> Long Term Goal(s): Knowledge of disease and therapeutic regimen to maintain health will improve  Short Term Goals: Ability to verbalize frustration and anger appropriately will improve, Ability to demonstrate self-control, Ability to participate in decision making will improve, Ability to verbalize feelings will improve, Ability to disclose and discuss suicidal ideas, Ability to identify and develop effective coping behaviors will improve, and Compliance with prescribed medications will improve  Medication Management: RN will administer medications as ordered by provider, will assess and evaluate patient's response and provide education to patient for prescribed medication. RN will report any adverse and/or side effects to prescribing provider.  Therapeutic Interventions: 1 on 1 counseling sessions, Psychoeducation, Medication administration, Evaluate responses to treatment, Monitor vital  signs and CBGs as ordered, Perform/monitor CIWA, COWS, AIMS and Fall Risk screenings as ordered, Perform wound care treatments as ordered.  Evaluation of Outcomes: Not Met   LCSW Treatment Plan for Primary Diagnosis: <principal problem not specified> Long Term Goal(s): Safe transition to appropriate next level of care at discharge, Engage patient in therapeutic group addressing interpersonal concerns.  Short Term Goals: Engage patient in aftercare planning with referrals and resources, Increase social support, Increase ability to appropriately verbalize feelings, Increase emotional regulation, Facilitate acceptance of mental health diagnosis and concerns, Facilitate patient progression through stages of change regarding substance use diagnoses and concerns, Identify triggers associated with mental health/substance abuse issues, and Increase skills for wellness and recovery  Therapeutic Interventions: Assess for all discharge needs, 1 to 1 time with Social worker, Explore available resources and support systems, Assess for adequacy in community support network, Educate family and significant other(s) on suicide prevention, Complete Psychosocial Assessment, Interpersonal group therapy.  Evaluation of Outcomes: Not Met   Progress in Treatment: Attending groups: No. Participating in groups: No. Taking medication as prescribed: Yes.  Toleration medication: Yes. Family/Significant other contact made: No, will contact:  Contact Attempts: Nedra Ball, mother, [phone], (name of family member/significant other) has been identified by the patient as the family member/significant other with whom the patient will be residing, and identified as the person(s) who will aid the patient in the event of a mental health crisis.  With written consent from the patient, two attempts were made to provide suicide prevention education, prior to and/or following the patient's discharge.  We were unsuccessful in  providing suicide prevention education.  A suicide education pamphlet was given to the patient to share with family/significant other. Patient understands diagnosis: Yes. Discussing patient identified problems/goals with staff: Yes. Medical problems stabilized or resolved: Yes. Denies suicidal/homicidal ideation: Yes. Issues/concerns per patient self-inventory: No. Other: None  New problem(s) identified: No, Describe:  None  New Short Term/Long Term Goal(s):detox, elimination of symptoms of psychosis, medication management for mood stabilization; elimination of SI thoughts; development of comprehensive mental wellness/sobriety plan.    Patient Goals:  "Getting better."  Discharge Plan or Barriers: CSW to assist in the development of appropriate discharge plan.   Reason for Continuation of Hospitalization: Anxiety Depression Suicidal ideation  Estimated Length of Stay: 1-7 days.   Last 3 [place] Suicide Severity Risk Score: Flowsheet Row Admission (Current) from 10/10/2023 in [facility] INPATIENT BEHAVIORAL MEDICINE ED from 10/09/2023 in [facility] Emergency Department at [facility] Admission (Discharged) from 08/03/2023 in [facility] INPATIENT BEHAVIORAL MEDICINE  C-SSRS RISK CATEGORY No Risk No Risk No Risk       Last PHQ 2/9 Scores:    07/16/2023   11:42 PM  Depression screen PHQ 2/9  Decreased Interest 1  Down, Depressed, Hopeless 1  PHQ - 2 Score 2  Altered sleeping 1  Tired, decreased energy 1  Change in appetite 1  Feeling bad or failure about yourself  1  Trouble concentrating 1  Moving slowly or fidgety/restless 1  Suicidal thoughts 0  PHQ-9 Score 8    Scribe for Treatment Team: Milam Allbaugh M Celedonio Sortino, Buzz Cass 10/11/2023 10:37 AM
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
## 2023-10-11 NOTE — Progress Notes (Signed)
 Patient observed pacing throughout the day. Patient stated she did not sleep too well due to also pacing all night. Patient appears to be able to sit for some time after scheduled ativan . Patient states it has been helping with her anxiety but that she is interested in getting ECT as that procedure has helped her before. Patient persistent on getting referred to ECT and made aware to discuss this with provider. Patient denies SI,HI, and A/V/H with no plan or intent and remains med compliant.

## 2023-10-12 DIAGNOSIS — F202 Catatonic schizophrenia: Secondary | ICD-10-CM | POA: Diagnosis not present

## 2023-10-12 LAB — LITHIUM LEVEL: Lithium Lvl: 0.48 mmol/L — ABNORMAL LOW (ref 0.60–1.20)

## 2023-10-12 MED ORDER — LITHIUM CARBONATE ER 450 MG PO TBCR
900.0000 mg | EXTENDED_RELEASE_TABLET | Freq: Two times a day (BID) | ORAL | Status: DC
  Administered 2023-10-12 – 2023-10-16 (×9): 900 mg via ORAL
  Filled 2023-10-12 (×10): qty 2

## 2023-10-12 MED ORDER — TRAZODONE HCL 100 MG PO TABS
100.0000 mg | ORAL_TABLET | Freq: Every day | ORAL | Status: DC
  Administered 2023-10-12 – 2023-10-23 (×12): 100 mg via ORAL
  Filled 2023-10-12 (×12): qty 1

## 2023-10-12 NOTE — Plan of Care (Signed)
   Problem: Education: Goal: Knowledge of Isabella [name] General Education information/materials will improve Outcome: Progressing Goal: Emotional status will improve Outcome: Progressing Goal: Mental status will improve Outcome: Progressing Goal: Verbalization of understanding the information provided will improve Outcome: Progressing   Problem: Activity: Goal: Interest or engagement in activities will improve Outcome: Progressing Goal: Sleeping patterns will improve Outcome: Progressing   Problem: Coping: Goal: Ability to verbalize frustrations and anger appropriately will improve Outcome: Progressing Goal: Ability to demonstrate self-control will improve Outcome: Progressing

## 2023-10-12 NOTE — Progress Notes (Signed)
   10/12/23 0900  Psych Admission Type (Psych Patients Only)  Admission Status Voluntary  Psychosocial Assessment  Patient Complaints Depression;Sleep disturbance;Other (Comment) (dizziness)  Eye Contact Fair  Facial Expression Flat  Affect Flat  Speech Logical/coherent;Soft  Interaction Minimal  Motor Activity Slow  Appearance/Hygiene Unremarkable  Behavior Characteristics Cooperative  Mood Depressed;Pleasant (Patient reports she did not sleep well last night. She continues to ask about receiving ECT.)  Thought Process  Coherency WDL  Content WDL  Delusions None reported or observed  Perception WDL  Hallucination None reported or observed  Judgment Poor  Confusion None  Danger to Self  Current suicidal ideation? Denies  Danger to Others  Danger to Others None reported or observed  Danger to Others Abnormal  Harmful Behavior to others No threats or harm toward other people  Destructive Behavior No threats or harm toward property

## 2023-10-12 NOTE — [facility] Suicide Risk Assessment (Signed)
 [facility] INPATIENT:  Family/Significant Other Suicide Prevention Education  Suicide Prevention Education:  Contact Attempts: Isabella [name], mother, [phone], (name of family member/significant other) has been identified by the patient as the family member/significant other with whom the patient will be residing, and identified as the person(s) who will aid the patient in the event of a mental health crisis.  With written consent from the patient, two attempts were made to provide suicide prevention education, prior to and/or following the patient's discharge.  We were unsuccessful in providing suicide prevention education.  A suicide education pamphlet was given to the patient to share with family/significant other.  Date and time of first attempt:10/10/2023 at 2:38PM  Date and time of second attempt: 10/12/2023 at 4:13PM  CSW left HIPAA compliant voicemail.  Use of interpreter services, [name] [phone].  [name] 10/12/2023, 4:12 PM

## 2023-10-12 NOTE — Group Note (Signed)
 Recreation Therapy Group Note   Group Topic:Healthy Support Systems  Group Date: 10/12/2023 Start Time: 1020 End Time: 1125 Facilitators: Deatrice Factor, LRT, CTRS Location: Craft Room  Group Description: Straw Bridge. In groups or individually, patients were given 10 plastic drinking straws and an equal length of masking tape. Using the materials provided, patients were instructed to build a free-standing bridge-like structure to suspend an everyday item (ex: deck of cards) off the floor or table surface. All materials were required to be used in Secondary school teacher. LRT facilitated post-activity discussion reviewing the importance of having strong and healthy support systems in our lives. LRT discussed how the people in our lives serve as the tape and the deck of cards we placed on top of our straw structure are the stressors we face in daily life. LRT and pts discussed what happens in our life when things get too heavy for us , and we don't have strong supports outside of the hospital. Pt shared 2 of their healthy supports in their life aloud in the group.   Goal Area(s) Addressed:  Patient will identify 2 healthy supports in their life. Patient will identify skills to successfully complete activity. Patient will identify correlation of this activity to life post-discharge.  Patient will build on frustration tolerance skills. Patient will increase team building and communication skills.    Affect/Mood: N/A   Participation Level: Did not attend    Clinical Observations/Individualized Feedback: Patient did not attend group.   Plan: Continue to engage patient in RT group sessions 2-3x/week.   [address] LRT, CTRS 10/12/2023 1:14 PM

## 2023-10-12 NOTE — Plan of Care (Signed)
  Problem: Education: Goal: Emotional status will improve Outcome: Progressing Goal: Mental status will improve Outcome: Progressing   Problem: Activity: Goal: Sleeping patterns will improve Outcome: Not Progressing   Problem: Health Behavior/Discharge Planning: Goal: Compliance with treatment plan for underlying cause of condition will improve Outcome: Progressing   Problem: Physical Regulation: Goal: Ability to maintain clinical measurements within normal limits will improve Outcome: Progressing   Problem: Safety: Goal: Periods of time without injury will increase Outcome: Progressing

## 2023-10-12 NOTE — Group Note (Signed)
 Date:  10/12/2023 Time:  9:26 PM  Group Topic/Focus:  Making Healthy Choices:   The focus of this group is to help patients identify negative/unhealthy choices they were using prior to admission and identify positive/healthier coping strategies to replace them upon discharge.    Participation Level:  Active  Participation Quality:  Appropriate, Attentive, and Sharing  Affect:  Appropriate  Cognitive:  Alert and Appropriate  Insight: Appropriate, Good, and Improving  Engagement in Group:  Developing/Improving and Engaged  Modes of Intervention:  Activity, Clarification, Discussion, Education, Orientation, Rapport Building, and Support  Additional Comments:     Emmakate Hypes 10/12/2023, 9:26 PM

## 2023-10-12 NOTE — Group Note (Signed)
 Date:  10/12/2023 Time:  6:12 PM  Group Topic/Focus:  Wellness Toolbox:   The focus of this group is to discuss various aspects of wellness, balancing those aspects and exploring ways to increase the ability to experience wellness.  Patients will create a wellness toolbox for use upon discharge.    Participation Level:  Did Not Attend   [name] [facility] 10/12/2023, 6:12 PM
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
## 2023-10-12 NOTE — Group Note (Signed)
 Date:  10/12/2023 Time:  10:25 AM  Group Topic/Focus:  Goals Group:   The focus of this group is to help patients establish daily goals to achieve during treatment and discuss how the patient can incorporate goal setting into their daily lives to aide in recovery.    Participation Level:  Did Not Attend   [name] [facility] 10/12/2023, 10:25 AM

## 2023-10-12 NOTE — Progress Notes (Signed)
 Pt randomly paces the halls; states, "I like to walk."  Pt reported mild anxiety and depression; denied SI/HI and AVH.  Affect flat and preoccupied; minimal or no peer engagement and interaction though visible in milieu.  She remains care compliant.  Pt also reports she called and spoke with her mother; she is ok, pt.    10/12/23 0000  Psych Admission Type (Psych Patients Only)  Admission Status Voluntary  Psychosocial Assessment  Patient Complaints Anxiety;Depression  Eye Contact Fair  Facial Expression Flat  Affect Appropriate to circumstance  Speech Soft  Interaction Assertive  Motor Activity Pacing  Appearance/Hygiene Unremarkable  Behavior Characteristics Appropriate to situation;Cooperative  Mood Pleasant;Depressed  Thought Process  Coherency WDL  Content WDL  Delusions None reported or observed  Perception WDL  Hallucination None reported or observed  Judgment Poor  Confusion None  Danger to Self  Current suicidal ideation? Denies  Danger to Others  Danger to Others None reported or observed  Danger to Others Abnormal  Harmful Behavior to others No threats or harm toward other people  Destructive Behavior No threats or harm toward property

## 2023-10-12 NOTE — Group Note (Signed)
 [facility] LCSW Group Therapy Note   Group Date: 10/12/2023 Start Time: 1300 End Time: 1350   Type of Therapy/Topic:  Group Therapy:  Emotion Regulation  Participation Level:  None   Mood:  Description of Group:    The purpose of this group is to assist patients in learning to regulate negative emotions and experience positive emotions. Patients will be guided to discuss ways in which they have been vulnerable to their negative emotions. These vulnerabilities will be juxtaposed with experiences of positive emotions or situations, and patients challenged to use positive emotions to combat negative ones. Special emphasis will be placed on coping with negative emotions in conflict situations, and patients will process healthy conflict resolution skills.  Therapeutic Goals: Patient will identify two positive emotions or experiences to reflect on in order to balance out negative emotions:  Patient will label two or more emotions that they find the most difficult to experience:  Patient will be able to demonstrate positive conflict resolution skills through discussion or role plays:   Summary of Patient Progress: Patient was present however did not participate in group discussion.    Patient appeared attentive throughout group.  Therapeutic Modalities:   Cognitive Behavioral Therapy Feelings Identification Dialectical Behavioral Therapy   [name], LCSW

## 2023-10-13 DIAGNOSIS — F202 Catatonic schizophrenia: Secondary | ICD-10-CM | POA: Diagnosis not present

## 2023-10-13 NOTE — Group Note (Signed)
 Date:  10/13/2023 Time:  10:43 AM  Group Topic/Focus:  Personal Choices and Values:   The focus of this group is to help patients assess and explore the importance of values in their lives, how their values affect their decisions, how they express their values and what opposes their expression.    Participation Level:  Did Not Attend   Isabella [name] Isabella [name] 10/13/2023, 10:43 AM

## 2023-10-13 NOTE — Plan of Care (Signed)
   Problem: Education: Goal: Knowledge of [facility] [place] Education information/materials will improve Outcome: Progressing Goal: Emotional status will improve Outcome: Not Progressing Goal: Mental status will improve Outcome: Not Progressing
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
## 2023-10-13 NOTE — Plan of Care (Signed)
 Isabella [name] is a 48 y.o. female patient. No diagnosis found. Past Medical History:  Diagnosis Date   Bipolar disorder (HCC)    Hypertension    Migraine 07/13/2016   Schizophrenia (HCC)    Current Facility-Administered Medications  Medication Dose Route Frequency Provider Last Rate Last Admin   acetaminophen  (TYLENOL ) tablet 650 mg  650 mg Oral Q6H PRN McLauchlin, Angela, NP       alum & mag hydroxide-simeth (MAALOX/MYLANTA) 200-200-20 MG/5ML suspension 30 mL  30 mL Oral Q4H PRN McLauchlin, Angela, NP       atorvastatin  (LIPITOR) tablet 20 mg  20 mg Oral Daily Jadapalle, Sree, MD   20 mg at 10/12/23 1610   cyanocobalamin  (VITAMIN B12) tablet 1,000 mcg  1,000 mcg Oral Daily Jadapalle, Sree, MD   1,000 mcg at 10/12/23 9604   haloperidol  (HALDOL ) tablet 5 mg  5 mg Oral TID PRN McLauchlin, Angela, NP       And   diphenhydrAMINE  (BENADRYL ) capsule 50 mg  50 mg Oral TID PRN McLauchlin, Angela, NP       haloperidol  lactate (HALDOL ) injection 5 mg  5 mg Intramuscular TID PRN McLauchlin, Angela, NP       And   diphenhydrAMINE  (BENADRYL ) injection 50 mg  50 mg Intramuscular TID PRN McLauchlin, Shelvy Dickens, NP       And   LORazepam  (ATIVAN ) injection 2 mg  2 mg Intramuscular TID PRN McLauchlin, Angela, NP       haloperidol  lactate (HALDOL ) injection 10 mg  10 mg Intramuscular TID PRN McLauchlin, Angela, NP       And   diphenhydrAMINE  (BENADRYL ) injection 50 mg  50 mg Intramuscular TID PRN McLauchlin, Shelvy Dickens, NP       And   LORazepam  (ATIVAN ) injection 2 mg  2 mg Intramuscular TID PRN McLauchlin, Angela, NP       FLUoxetine  (PROZAC ) capsule 60 mg  60 mg Oral Daily Jadapalle, Sree, MD   60 mg at 10/12/23 5409   hydrOXYzine  (ATARAX ) tablet 25 mg  25 mg Oral TID PRN McLauchlin, Angela, NP       lithium  carbonate (ESKALITH ) ER tablet 900 mg  900 mg Oral Q12H Jadapalle, Sree, MD   900 mg at 10/12/23 2114   LORazepam  (ATIVAN ) tablet 0.5 mg  0.5 mg Oral BID Jadapalle, Sree, MD   0.5 mg at 10/12/23  1705   magnesium  hydroxide (MILK OF MAGNESIA) suspension 30 mL  30 mL Oral Daily PRN McLauchlin, Angela, NP       metFORMIN  (GLUCOPHAGE ) tablet 500 mg  500 mg Oral Q breakfast Jadapalle, Sree, MD   500 mg at 10/12/23 8119   traZODone  (DESYREL ) tablet 100 mg  100 mg Oral QHS Jadapalle, Sree, MD   100 mg at 10/12/23 2114   No Known Allergies Active Problems:   Schizoaffective disorder, bipolar type, with catatonia (HCC)  Blood pressure 116/78, pulse 78, temperature 97.9 F (36.6 C), temperature source Oral, resp. rate 18, height 5\' 4"  (1.626 m), weight 88 kg, last menstrual period 09/13/2023, SpO2 100%.    Worthy Boschert B Jeanenne Licea 10/13/2023

## 2023-10-13 NOTE — Plan of Care (Signed)
  Problem: Education: Goal: Emotional status will improve Outcome: Progressing Goal: Mental status will improve Outcome: Progressing   Problem: Activity: Goal: Interest or engagement in activities will improve Outcome: Progressing Goal: Sleeping patterns will improve Outcome: Progressing   Problem: Health Behavior/Discharge Planning: Goal: Compliance with treatment plan for underlying cause of condition will improve Outcome: Progressing   Problem: Physical Regulation: Goal: Ability to maintain clinical measurements within normal limits will improve Outcome: Progressing   Problem: Safety: Goal: Periods of time without injury will increase Outcome: Progressing

## 2023-10-13 NOTE — Group Note (Signed)
 Date:  10/13/2023 Time:  9:11 PM  Group Topic/Focus:  Developing a Wellness Toolbox:   The focus of this group is to help patients develop a "wellness toolbox" with skills and strategies to promote recovery upon discharge. Self Care:   The focus of this group is to help patients understand the importance of self-care in order to improve or restore emotional, physical, spiritual, interpersonal, and financial health. Self Esteem Action Plan:   The focus of this group is to help patients create a plan to continue to build self-esteem after discharge.    Participation Level:  Active  Participation Quality:  Appropriate  Affect:  Appropriate and Flat  Cognitive:  Appropriate and Oriented  Insight: Appropriate  Engagement in Group:  Limited  Modes of Intervention:  Discussion, Exploration, and Support  Additional Comments:  N/A  [name] L 10/13/2023, 9:11 PM

## 2023-10-13 NOTE — Group Note (Signed)
 Recreation Therapy Group Note   Group Topic:Leisure Education  Group Date: 10/13/2023 Start Time: 1040 End Time: 1140 Facilitators: Deatrice Factor, LRT, CTRS Location: Craft Room  Group Description: Leisure. Patients were given the option to choose from singing karaoke, coloring mandalas, using oil pastels, journaling, or playing with play-doh. LRT and pts discussed the meaning of leisure, the importance of participating in leisure during their free time/when they're outside of the hospital, as well as how our leisure interests can also serve as coping skills.   Goal Area(s) Addressed:  Patient will identify a current leisure interest.  Patient will learn the definition of "leisure". Patient will practice making a positive decision. Patient will have the opportunity to try a new leisure activity. Patient will communicate with peers and LRT.    Affect/Mood: N/A   Participation Level: Did not attend    Clinical Observations/Individualized Feedback: Patient did not attend group.   Plan: Continue to engage patient in RT group sessions 2-3x/week.   Deatrice Factor, LRT, CTRS 10/13/2023 1:32 PM

## 2023-10-13 NOTE — Progress Notes (Signed)
   10/13/23 0900  Psych Admission Type (Psych Patients Only)  Admission Status Voluntary  Psychosocial Assessment  Patient Complaints Anxiety;Depression  Eye Contact Fair  Facial Expression Flat  Affect Flat  Speech Logical/coherent;Soft  Interaction Minimal  Motor Activity Pacing;Slow  Appearance/Hygiene Unremarkable  Behavior Characteristics Cooperative;Pacing  Mood Depressed;Pleasant (Patient states her goal is "I want to feel better.")  Thought Process  Coherency WDL  Content WDL  Delusions None reported or observed  Perception WDL  Hallucination None reported or observed  Judgment Poor  Confusion None  Danger to Self  Current suicidal ideation? Denies  Danger to Others  Danger to Others None reported or observed  Danger to Others Abnormal  Harmful Behavior to others No threats or harm toward other people  Destructive Behavior No threats or harm toward property

## 2023-10-13 NOTE — Group Note (Signed)
 Date:  10/13/2023 Time:  4:11 PM  Group Topic/Focus:  Activity Group: The focus of the group is to promote activity for the patients and encourage them to go outside to the courtyard and get some fresh air and some exercise.    Participation Level:  Did Not Attend   Marianna Shirk Kaityln Kallstrom 10/13/2023, 4:11 PM

## 2023-10-14 DIAGNOSIS — F250 Schizoaffective disorder, bipolar type: Secondary | ICD-10-CM

## 2023-10-14 DIAGNOSIS — F061 Catatonic disorder due to known physiological condition: Secondary | ICD-10-CM

## 2023-10-14 LAB — LITHIUM LEVEL: Lithium Lvl: 1.2 mmol/L (ref 0.60–1.20)

## 2023-10-14 MED ORDER — PALIPERIDONE ER 3 MG PO TB24
3.0000 mg | ORAL_TABLET | Freq: Every day | ORAL | Status: DC
  Administered 2023-10-14 – 2023-10-24 (×11): 3 mg via ORAL
  Filled 2023-10-14 (×11): qty 1

## 2023-10-14 NOTE — Plan of Care (Signed)

## 2023-10-14 NOTE — Progress Notes (Signed)
   10/13/23 2000  Psych Admission Type (Psych Patients Only)  Admission Status Voluntary  Psychosocial Assessment  Patient Complaints Anxiety;Depression  Eye Contact Fair  Facial Expression Flat  Affect Flat  Speech Logical/coherent  Interaction Minimal  Motor Activity Pacing;Slow  Appearance/Hygiene Unremarkable  Behavior Characteristics Cooperative;Appropriate to situation  Mood Depressed;Pleasant  Thought Process  Coherency WDL  Content WDL  Delusions None reported or observed  Perception WDL  Hallucination None reported or observed  Judgment Poor  Confusion None  Danger to Self  Current suicidal ideation? Denies  Danger to Others  Danger to Others None reported or observed  Danger to Others Abnormal  Harmful Behavior to others No threats or harm toward other people  Destructive Behavior No threats or harm toward property

## 2023-10-14 NOTE — Group Note (Signed)
 Date:  10/14/2023 Time:  11:50 PM  Group Topic/Focus:  Building Self Esteem:   The Focus of this group is helping patients become aware of the effects of self-esteem on their lives, the things they and others do that enhance or undermine their self-esteem, seeing the relationship between their level of self-esteem and the choices they make and learning ways to enhance self-esteem. Goals Group:   The focus of this group is to help patients establish daily goals to achieve during treatment and discuss how the patient can incorporate goal setting into their daily lives to aide in recovery.    Participation Level:  Minimal  Participation Quality:  Appropriate  Affect:  Appropriate  Cognitive:  Appropriate and Oriented  Insight: Appropriate and Limited  Engagement in Group:  Limited  Modes of Intervention:  Discussion and Support  Additional Comments:  N/A  Ardia Kraft 10/14/2023, 11:50 PM

## 2023-10-14 NOTE — Group Note (Signed)
 Date:  10/14/2023 Time:  1:07 PM  Group Topic/Focus:  Managing Feelings:   The focus of this group is to identify what feelings patients have difficulty handling and develop a plan to handle them in a healthier way upon discharge.    Participation Level:  Did Not Attend   Isabella [name] [name] 10/14/2023, 1:07 PM

## 2023-10-14 NOTE — Plan of Care (Signed)
   Problem: Education: Goal: Knowledge of [facility] Education information/materials will improve Outcome: Progressing Goal: Emotional status will improve Outcome: Progressing Goal: Mental status will improve Outcome: Progressing

## 2023-10-14 NOTE — Group Note (Signed)
 Date:  10/14/2023 Time:  1:12 PM  Group Topic/Focus:  Activity Group: The focus of the group is to promote activity for the patients and encourage them to go outside to the courtyard and get some exercise and some fresh air.    Participation Level:  Did Not Attend   Isabella [name] 10/14/2023, 1:12 PM

## 2023-10-14 NOTE — Progress Notes (Signed)
 Patient presents with flat affect and states she slept fairly last night. Patient currently denies SI,HI, and A/V/H with no plan or intent but states her anxiety is a 9 and depression a 8 out of 10. Patient is med compliant this morning and states the medications have been helping a little more. Patient states her goal for today is "getting better." Patient remains cooperative in unit.
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
## 2023-10-15 DIAGNOSIS — F250 Schizoaffective disorder, bipolar type: Secondary | ICD-10-CM | POA: Diagnosis not present

## 2023-10-15 DIAGNOSIS — F061 Catatonic disorder due to known physiological condition: Secondary | ICD-10-CM | POA: Diagnosis not present

## 2023-10-15 LAB — COMPREHENSIVE METABOLIC PANEL WITH GFR
ALT: 12 U/L (ref 0–44)
AST: 17 U/L (ref 15–41)
Albumin: 3.5 g/dL (ref 3.5–5.0)
Alkaline Phosphatase: 48 U/L (ref 38–126)
Anion gap: 7 (ref 5–15)
BUN: 13 mg/dL (ref 6–20)
CO2: 25 mmol/L (ref 22–32)
Calcium: 9 mg/dL (ref 8.9–10.3)
Chloride: 104 mmol/L (ref 98–111)
Creatinine, Ser: 0.61 mg/dL (ref 0.44–1.00)
GFR, Estimated: >60 mL/min (ref >60)
Glucose, Bld: 86 mg/dL (ref 70–99)
Potassium: 3.8 mmol/L (ref 3.5–5.1)
Sodium: 136 mmol/L (ref 135–145)
Total Bilirubin: 0.6 mg/dL (ref 0.0–1.2)
Total Protein: 6.5 g/dL (ref 6.5–8.1)

## 2023-10-15 MED ORDER — MIDODRINE HCL 5 MG PO TABS
10.0000 mg | ORAL_TABLET | Freq: Three times a day (TID) | ORAL | Status: DC | PRN
  Filled 2023-10-15: qty 2

## 2023-10-15 MED ORDER — MECLIZINE HCL 25 MG PO TABS
25.0000 mg | ORAL_TABLET | Freq: Three times a day (TID) | ORAL | Status: DC | PRN
  Administered 2023-10-20: 25 mg via ORAL
  Filled 2023-10-15 (×3): qty 1

## 2023-10-15 NOTE — Progress Notes (Signed)
   10/14/23 2300  Psych Admission Type (Psych Patients Only)  Admission Status Voluntary  Psychosocial Assessment  Patient Complaints Depression;Anxiety  Eye Contact Fair  Facial Expression Flat  Affect Flat  Speech Logical/coherent  Interaction Minimal  Motor Activity Slow  Appearance/Hygiene Unremarkable  Behavior Characteristics Cooperative;Appropriate to situation  Mood Pleasant  Thought Process  Coherency WDL  Content WDL  Delusions None reported or observed  Perception WDL  Hallucination None reported or observed  Judgment Poor  Confusion None  Danger to Self  Current suicidal ideation? Denies  Danger to Others  Danger to Others None reported or observed  Danger to Others Abnormal  Harmful Behavior to others No threats or harm toward other people  Destructive Behavior No threats or harm toward property

## 2023-10-15 NOTE — Progress Notes (Signed)
 Patient presents with flat affect and is med compliant. Patient denies SI,HI, and A/V/H with no plan or intent and states her depression is a 7 out of 10 and currently denies any anxiety. Patient states she slept good but her main concerning has been feeling dizzy. Provider made aware. Patient remains cooperative in unit with no s/s of current distress.

## 2023-10-15 NOTE — Plan of Care (Signed)
?  Problem: Education: ?Goal: Emotional status will improve ?Outcome: Progressing ?Goal: Mental status will improve ?Outcome: Progressing ?  ?Problem: Health Behavior/Discharge Planning: ?Goal: Compliance with treatment plan for underlying cause of condition will improve ?Outcome: Progressing ?  ?Problem: Safety: ?Goal: Periods of time without injury will increase ?Outcome: Progressing ?  ?

## 2023-10-15 NOTE — Progress Notes (Signed)
   10/15/23 1818  Vital Signs  Pulse Rate 62  Pulse Rate Source Monitor  BP (!) 88/53  BP Location Left Arm  BP Method Automatic  Patient Position (if appropriate) Lying   Patient was given Gatorade and encouraged to increase fluid intake to help with BP. Patient verbalized understanding. Patient also informed to notify staff of any new signs/symptoms.

## 2023-10-15 NOTE — Group Note (Signed)
 Date:  10/15/2023 Time:  4:07 PM  Group Topic/Focus:   Goals Group:   The focus of this group is to help patients establish daily goals to achieve during treatment and discuss how the patient can incorporate goal setting into their daily lives to aide in recovery.   Participation Level:  Did Not Attend   Amber Williard A Tereso Unangst 10/15/2023, 4:07 PM
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
## 2023-10-15 NOTE — Group Note (Signed)
 Date:  10/15/2023 Time:  11:20 PM  Group Topic/Focus:  Emotional Education:   The focus of this group is to discuss what feelings/emotions are, and how they are experienced. Identifying Needs:   The focus of this group is to help patients identify their personal needs that have been historically problematic and identify healthy behaviors to address their needs. Self Care:   The focus of this group is to help patients understand the importance of self-care in order to improve or restore emotional, physical, spiritual, interpersonal, and financial health.    Participation Level:  Active  Participation Quality:  Appropriate and Attentive  Affect:  Appropriate  Cognitive:  Alert, Appropriate, and Oriented  Insight: Appropriate and Good  Engagement in Group:  Engaged  Modes of Intervention:  Discussion and Support  Additional Comments:  N/A  Isabella [name] 10/15/2023, 11:20 PM

## 2023-10-16 DIAGNOSIS — F061 Catatonic disorder due to known physiological condition: Secondary | ICD-10-CM | POA: Diagnosis not present

## 2023-10-16 DIAGNOSIS — F250 Schizoaffective disorder, bipolar type: Secondary | ICD-10-CM | POA: Diagnosis not present

## 2023-10-16 NOTE — Plan of Care (Signed)
  Problem: Education: Goal: Emotional status will improve Outcome: Progressing Goal: Mental status will improve Outcome: Progressing Goal: Verbalization of understanding the information provided will improve Outcome: Progressing   Problem: Coping: Goal: Ability to verbalize frustrations and anger appropriately will improve Outcome: Progressing Goal: Ability to demonstrate self-control will improve Outcome: Progressing   Problem: Activity: Goal: Interest or engagement in activities will improve Outcome: Not Progressing Goal: Sleeping patterns will improve Outcome: Not Progressing

## 2023-10-16 NOTE — Plan of Care (Signed)
  Problem: Education: Goal: Knowledge of [facility] Education information/materials will improve Outcome: Progressing Goal: Emotional status will improve Outcome: Progressing   Problem: Activity: Goal: Interest or engagement in activities will improve Outcome: Progressing Goal: Sleeping patterns will improve Outcome: Progressing

## 2023-10-16 NOTE — Group Note (Signed)
 Date:  10/16/2023 Time:  9:47 PM  Group Topic/Focus:  Wrap-Up Group:   The focus of this group is to help patients review their daily goal of treatment and discuss progress on daily workbooks.    Participation Level:  Active  Participation Quality:  Appropriate  Affect:  Appropriate  Cognitive:  Appropriate  Insight: Appropriate  Engagement in Group:  Engaged  Modes of Intervention:  Discussion  Additional Comments:     Fabiola Holy 10/16/2023, 9:47 PM

## 2023-10-16 NOTE — Progress Notes (Signed)
   10/16/23 0900  Psych Admission Type (Psych Patients Only)  Admission Status Voluntary  Psychosocial Assessment  Patient Complaints Anxiety;Depression  Eye Contact Fair  Facial Expression Flat  Affect Flat  Speech Logical/coherent  Interaction Minimal  Motor Activity Slow  Appearance/Hygiene In scrubs  Behavior Characteristics Cooperative  Mood Depressed;Anxious;Pleasant  Thought Process  Coherency WDL  Content WDL  Delusions None reported or observed  Perception WDL  Hallucination None reported or observed  Judgment Poor  Confusion None  Danger to Self  Current suicidal ideation? Denies  Danger to Others  Danger to Others None reported or observed  Danger to Others Abnormal  Harmful Behavior to others No threats or harm toward other people  Destructive Behavior No threats or harm toward property   Patient states her goal for today is to, "continue to get better."

## 2023-10-16 NOTE — Group Note (Signed)
 Recreation Therapy Group Note   Group Topic:General Recreation  Group Date: 10/16/2023 Start Time: 1500 End Time: 1600 Facilitators: Deatrice Factor, LRT, CTRS Location: Courtyard  Group Description: [employer]. LRT and patients played games of basketball, drew with chalk, and played corn hole while outside in the courtyard while getting fresh air and sunlight. Music was being played in the background. LRT and peers conversed about different games they have played before, what they do in their free time and anything else that is on their minds. LRT encouraged pts to drink water  after being outside, sweating and getting their heart rate up.  Goal Area(s) Addressed: Patient will build on frustration tolerance skills. Patients will partake in a competitive play game with peers. Patients will gain knowledge of new leisure interest/hobby.    Affect/Mood: N/A   Participation Level: Did not attend    Clinical Observations/Individualized Feedback: Patient did not attend group.   Plan: Continue to engage patient in RT group sessions 2-3x/week.   Deatrice Factor, LRT, CTRS 10/16/2023 5:03 PM

## 2023-10-16 NOTE — BH IP Treatment Plan (Signed)
 Interdisciplinary Treatment and Diagnostic Plan Update  10/16/2023 Time of Session: 1:00PM Isabella [name] MRN: [id number]  Principal Diagnosis: <principal problem not specified>  Secondary Diagnoses: Active Problems:   Schizoaffective disorder, bipolar type, with catatonia (HCC)   Current Medications:  Current Facility-Administered Medications  Medication Dose Route Frequency Provider Last Rate Last Admin   acetaminophen  (TYLENOL ) tablet 650 mg  650 mg Oral Q6H PRN McLauchlin, Angela, NP       alum & mag hydroxide-simeth (MAALOX/MYLANTA) 200-200-20 MG/5ML suspension 30 mL  30 mL Oral Q4H PRN McLauchlin, Angela, NP       atorvastatin  (LIPITOR) tablet 20 mg  20 mg Oral Daily Jadapalle, Sree, MD   20 mg at 10/16/23 4098   cyanocobalamin  (VITAMIN B12) tablet 1,000 mcg  1,000 mcg Oral Daily Jadapalle, Sree, MD   1,000 mcg at 10/16/23 0843   haloperidol  (HALDOL ) tablet 5 mg  5 mg Oral TID PRN McLauchlin, Angela, NP       And   diphenhydrAMINE  (BENADRYL ) capsule 50 mg  50 mg Oral TID PRN McLauchlin, Angela, NP       haloperidol  lactate (HALDOL ) injection 5 mg  5 mg Intramuscular TID PRN McLauchlin, Angela, NP       And   diphenhydrAMINE  (BENADRYL ) injection 50 mg  50 mg Intramuscular TID PRN McLauchlin, Angela, NP       And   LORazepam  (ATIVAN ) injection 2 mg  2 mg Intramuscular TID PRN McLauchlin, Shelvy Dickens, NP       haloperidol  lactate (HALDOL ) injection 10 mg  10 mg Intramuscular TID PRN McLauchlin, Shelvy Dickens, NP       And   diphenhydrAMINE  (BENADRYL ) injection 50 mg  50 mg Intramuscular TID PRN McLauchlin, Angela, NP       And   LORazepam  (ATIVAN ) injection 2 mg  2 mg Intramuscular TID PRN McLauchlin, Angela, NP       FLUoxetine  (PROZAC ) capsule 60 mg  60 mg Oral Daily Jadapalle, Sree, MD   60 mg at 10/16/23 1191   hydrOXYzine  (ATARAX ) tablet 25 mg  25 mg Oral TID PRN McLauchlin, Angela, NP       lithium  carbonate (ESKALITH ) ER tablet 900 mg  900 mg Oral Q12H Jadapalle, Sree, MD   900  mg at 10/16/23 4782   LORazepam  (ATIVAN ) tablet 0.5 mg  0.5 mg Oral BID Jadapalle, Sree, MD   0.5 mg at 10/16/23 9562   magnesium  hydroxide (MILK OF MAGNESIA) suspension 30 mL  30 mL Oral Daily PRN McLauchlin, Angela, NP       meclizine (ANTIVERT) tablet 25 mg  25 mg Oral TID PRN Shrivastava, Aryendra, MD       metFORMIN  (GLUCOPHAGE ) tablet 500 mg  500 mg Oral Q breakfast Jadapalle, Sree, MD   500 mg at 10/16/23 0843   midodrine (PROAMATINE) tablet 10 mg  10 mg Oral TID PRN Shrivastava, Aryendra, MD       paliperidone  (INVEGA ) 24 hr tablet 3 mg  3 mg Oral Daily Shrivastava, Aryendra, MD   3 mg at 10/16/23 1308   traZODone  (DESYREL ) tablet 100 mg  100 mg Oral QHS Jadapalle, Sree, MD   100 mg at 10/15/23 2113   PTA Medications: Medications Prior to Admission  Medication Sig Dispense Refill Last Dose/Taking   atorvastatin  (LIPITOR) 20 MG tablet Take 1 tablet (20 mg total) by mouth daily. 30 tablet 0    cholecalciferol  (CHOLECALCIFEROL ) 25 MCG tablet Take 1 tablet (1,000 Units total) by mouth daily. 30 tablet 0  cyanocobalamin  1000 MCG tablet Take 1 tablet (1,000 mcg total) by mouth daily. 30 tablet 0    FLUoxetine  (PROZAC ) 20 MG capsule Take 3 capsules (60 mg total) by mouth daily. 90 capsule 3    lithium  carbonate (LITHOBID ) 300 MG ER tablet Take 2 tablets (600 mg total) by mouth every 12 (twelve) hours. 60 tablet 0    LORazepam  (ATIVAN ) 0.5 MG tablet Take 1 tablet (0.5 mg total) by mouth 2 (two) times daily. 30 tablet 0    metFORMIN  (GLUCOPHAGE ) 500 MG tablet Take 1 tablet (500 mg total) by mouth daily with breakfast. 30 tablet 0     Patient Stressors:    Patient Strengths:    Treatment Modalities: Medication Management, Group therapy, Case management,  1 to 1 session with clinician, Psychoeducation, Recreational therapy.   Physician Treatment Plan for Primary Diagnosis: <principal problem not specified> Long Term Goal(s): Improvement in symptoms so as ready for discharge   Short  Term Goals: Ability to identify changes in lifestyle to reduce recurrence of condition will improve Ability to verbalize feelings will improve Ability to disclose and discuss suicidal ideas Ability to demonstrate self-control will improve Ability to identify and develop effective coping behaviors will improve  Medication Management: Evaluate patient's response, side effects, and tolerance of medication regimen.  Therapeutic Interventions: 1 to 1 sessions, Unit Group sessions and Medication administration.  Evaluation of Outcomes: Progressing  Physician Treatment Plan for Secondary Diagnosis: Active Problems:   Schizoaffective disorder, bipolar type, with catatonia (HCC)  Long Term Goal(s): Improvement in symptoms so as ready for discharge   Short Term Goals: Ability to identify changes in lifestyle to reduce recurrence of condition will improve Ability to verbalize feelings will improve Ability to disclose and discuss suicidal ideas Ability to demonstrate self-control will improve Ability to identify and develop effective coping behaviors will improve     Medication Management: Evaluate patient's response, side effects, and tolerance of medication regimen.  Therapeutic Interventions: 1 to 1 sessions, Unit Group sessions and Medication administration.  Evaluation of Outcomes: Progressing   RN Treatment Plan for Primary Diagnosis: <principal problem not specified> Long Term Goal(s): Knowledge of disease and therapeutic regimen to maintain health will improve  Short Term Goals: Ability to demonstrate self-control, Ability to participate in decision making will improve, Ability to verbalize feelings will improve, Ability to disclose and discuss suicidal ideas, Ability to identify and develop effective coping behaviors will improve, and Compliance with prescribed medications will improve  Medication Management: RN will administer medications as ordered by provider, will assess and  evaluate patient's response and provide education to patient for prescribed medication. RN will report any adverse and/or side effects to prescribing provider.  Therapeutic Interventions: 1 on 1 counseling sessions, Psychoeducation, Medication administration, Evaluate responses to treatment, Monitor vital signs and CBGs as ordered, Perform/monitor CIWA, COWS, AIMS and Fall Risk screenings as ordered, Perform wound care treatments as ordered.  Evaluation of Outcomes: Progressing   LCSW Treatment Plan for Primary Diagnosis: <principal problem not specified> Long Term Goal(s): Safe transition to appropriate next level of care at discharge, Engage patient in therapeutic group addressing interpersonal concerns.  Short Term Goals: Engage patient in aftercare planning with referrals and resources, Increase social support, Increase ability to appropriately verbalize feelings, Increase emotional regulation, Facilitate acceptance of mental health diagnosis and concerns, and Increase skills for wellness and recovery  Therapeutic Interventions: Assess for all discharge needs, 1 to 1 time with Social worker, Explore available resources and support systems, Assess for adequacy  in community support network, Educate family and significant other(s) on suicide prevention, Complete Psychosocial Assessment, Interpersonal group therapy.  Evaluation of Outcomes: Progressing   Progress in Treatment: Attending groups: Yes. Participating in groups: No. Taking medication as prescribed: Yes. Toleration medication: Yes. Family/Significant other contact made: Yes, individual(s) contacted:  collateral contacts attempts were made Patient understands diagnosis: Yes. Discussing patient identified problems/goals with staff: Yes. Medical problems stabilized or resolved: Yes. Denies suicidal/homicidal ideation: Yes. Issues/concerns per patient self-inventory: No. Other: none  New problem(s) identified: No, Describe:  None  Update 10/16/2023:  No changes at this time.    New Short Term/Long Term Goal(s):detox, elimination of symptoms of psychosis, medication management for mood stabilization; elimination of SI thoughts; development of comprehensive mental wellness/sobriety plan.  Update 10/16/2023:  No changes at this time.    Patient Goals:  "Getting better." Update 10/16/2023:  No changes at this time.    Discharge Plan or Barriers: CSW to assist in the development of appropriate discharge plan.  Update 10/16/2023:  Patient reports a desire for ECT but has transportation concerns on  being able to get there and home.  Patient also would benefit from an ACT team but there is not a provider in the area.     Reason for Continuation of Hospitalization: Anxiety Depression Suicidal ideation   Estimated Length of Stay: 1-7 days. Update 10/16/2023:  TBD  Last 3 [place] Suicide Severity Risk Score: Flowsheet Row Admission (Current) from 10/10/2023 in [facility] INPATIENT BEHAVIORAL MEDICINE ED from 10/09/2023 in [facility] at [facility] Admission (Discharged) from 08/03/2023 in [facility] INPATIENT BEHAVIORAL MEDICINE  C-SSRS RISK CATEGORY No Risk No Risk No Risk       Last PHQ 2/9 Scores:    07/16/2023   11:42 PM  Depression screen PHQ 2/9  Decreased Interest 1  Down, Depressed, Hopeless 1  PHQ - 2 Score 2  Altered sleeping 1  Tired, decreased energy 1  Change in appetite 1  Feeling bad or failure about yourself  1  Trouble concentrating 1  Moving slowly or fidgety/restless 1  Suicidal thoughts 0  PHQ-9 Score 8    Scribe for Treatment Team: [name], [place] 10/16/2023 2:19 PM

## 2023-10-16 NOTE — Progress Notes (Signed)
   10/15/23 2025  Psych Admission Type (Psych Patients Only)  Admission Status Voluntary  Psychosocial Assessment  Patient Complaints Depression  Eye Contact Fair  Facial Expression Flat  Affect Flat  Speech Logical/coherent  Interaction Minimal  Motor Activity Slow  Appearance/Hygiene Unremarkable  Behavior Characteristics Cooperative;Appropriate to situation  Mood Pleasant  Thought Process  Coherency WDL  Content WDL  Delusions None reported or observed  Perception WDL  Hallucination None reported or observed  Judgment Poor  Confusion None  Danger to Self  Current suicidal ideation? Denies  Danger to Others  Danger to Others None reported or observed  Danger to Others Abnormal  Harmful Behavior to others No threats or harm toward other people  Destructive Behavior No threats or harm toward property

## 2023-10-16 NOTE — Group Note (Signed)
 Recreation Therapy Group Note   Group Topic:Health and Wellness  Group Date: 10/16/2023 Start Time: 1000 End Time: 1050 Facilitators: Deatrice Factor, LRT, CTRS Location: Craft Room  Activity Description/Intervention: Therapeutic Drumming. Patients with peers and staff were given the opportunity to engage in a leader facilitated HealthRHYTH[facility] Group Empowerment Drumming Circle with staff from the [employer], in partnership with [employer]. Teaching laboratory technician and trained [employer], Kathlyne Parchment leading with LRT observing and documenting intervention and pt response. This evidenced-based practice targets 7 areas of health and wellbeing in the human experience including: stress-reduction, exercise, self-expression, camaraderie/support, nurturing, spirituality, and music-making (leisure).    Goal Area(s) Addresses:  Patient will engage in pro-social way in music group.  Patient will follow directions of drum leader on the first prompt. Patient will demonstrate no behavioral issues during group.  Patient will identify if a reduction in stress level occurs as a result of participation in therapeutic drum circle.     Affect/Mood: N/A   Participation Level: Did not attend    Clinical Observations/Individualized Feedback: Patient did not attend group.   Plan: Continue to engage patient in RT group sessions 2-3x/week.   Deatrice Factor, LRT, CTRS 10/16/2023 11:45 AM
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
## 2023-10-16 NOTE — Group Note (Signed)
 [facility] LCSW Group Therapy Note    Group Date: 10/16/2023 Start Time: 1320 End Time: 1350  Type of Therapy and Topic:  Group Therapy:  Overcoming Obstacles  Participation Level:  [facility] PARTICIPATION LEVEL: Did Not Attend   Description of Group:   In this group patients will be encouraged to explore what they see as obstacles to their own wellness and recovery. They will be guided to discuss their thoughts, feelings, and behaviors related to these obstacles. The group will process together ways to cope with barriers, with attention given to specific choices patients can make. Each patient will be challenged to identify changes they are motivated to make in order to overcome their obstacles. This group will be process-oriented, with patients participating in exploration of their own experiences as well as giving and receiving support and challenge from other group members.  Therapeutic Goals: 1. Patient will identify personal and current obstacles as they relate to admission. 2. Patient will identify barriers that currently interfere with their wellness or overcoming obstacles.  3. Patient will identify feelings, thought process and behaviors related to these barriers. 4. Patient will identify two changes they are willing to make to overcome these obstacles:    Summary of Patient Progress Patient did not attend group.    Therapeutic Modalities:   Cognitive Behavioral Therapy Solution Focused Therapy Motivational Interviewing Relapse Prevention Therapy   Randolm Butte, LCSW

## 2023-10-17 DIAGNOSIS — F061 Catatonic disorder due to known physiological condition: Secondary | ICD-10-CM | POA: Diagnosis not present

## 2023-10-17 DIAGNOSIS — F250 Schizoaffective disorder, bipolar type: Secondary | ICD-10-CM | POA: Diagnosis not present

## 2023-10-17 LAB — LITHIUM LEVEL: Lithium Lvl: 1.48 mmol/L — ABNORMAL HIGH (ref 0.60–1.20)

## 2023-10-17 MED ORDER — LITHIUM CARBONATE ER 300 MG PO TBCR
600.0000 mg | EXTENDED_RELEASE_TABLET | Freq: Two times a day (BID) | ORAL | Status: DC
  Administered 2023-10-18 – 2023-10-24 (×13): 600 mg via ORAL
  Filled 2023-10-17 (×13): qty 2

## 2023-10-17 NOTE — Group Note (Signed)
 [facility] LCSW Group Therapy Note   Group Date: 10/17/2023 Start Time: 1300 End Time: 1400  Type of Therapy/Topic:  Group Therapy:  Feelings about Diagnosis  Participation Level:  Did Not Attend   Description of Group:    This group will allow patients to explore their thoughts and feelings about diagnoses they have received. Patients will be guided to explore their level of understanding and acceptance of these diagnoses. Facilitator will encourage patients to process their thoughts and feelings about the reactions of others to their diagnosis, and will guide patients in identifying ways to discuss their diagnosis with significant others in their lives. This group will be process-oriented, with patients participating in exploration of their own experiences as well as giving and receiving support and challenge from other group members.   Therapeutic Goals: 1. Patient will demonstrate understanding of diagnosis as evidence by identifying two or more symptoms of the disorder:  2. Patient will be able to express two feelings regarding the diagnosis 3. Patient will demonstrate ability to communicate their needs through discussion and/or role plays  Summary of Patient Progress: Patient declined to attend group.  Therapeutic Modalities:   Cognitive Behavioral Therapy Brief Therapy Feelings Identification    [name], LCSW

## 2023-10-17 NOTE — [facility] Suicide Risk Assessment (Signed)
 [facility] INPATIENT:  Family/Significant Other Suicide Prevention Education  Suicide Prevention Education:  Education Completed; Nedra Ball, mother, [phone] ,  (name of family member/significant other) has been identified by the patient as the family member/significant other with whom the patient will be residing, and identified as the person(s) who will aid the patient in the event of a mental health crisis (suicidal ideations/suicide attempt).  With written consent from the patient, the family member/significant other has been provided the following suicide prevention education, prior to the and/or following the discharge of the patient.  The suicide prevention education provided includes the following: Suicide risk factors Suicide prevention and interventions National Suicide Hotline telephone number [facility] assessment telephone number [facility] Emergency Assistance 911 [facility] and/or Residential Mobile Crisis Unit telephone number  Request made of family/significant other to: Remove weapons (e.g., guns, rifles, knives), all items previously/currently identified as safety concern.   Remove drugs/medications (over-the-counter, prescriptions, illicit drugs), all items previously/currently identified as a safety concern.  The family member/significant other verbalizes understanding of the suicide prevention education information provided.  The family member/significant other agrees to remove the items of safety concern listed above.  [name] 10/17/2023, 4:23 PM

## 2023-10-17 NOTE — Group Note (Signed)
 Date:  10/17/2023 Time:  8:41 PM  Group Topic/Focus:  Wrap-Up Group:   The focus of this group is to help patients review their daily goal of treatment and discuss progress on daily workbooks.    Participation Level:  Active  Participation Quality:  Appropriate  Affect:  Appropriate  Cognitive:  Appropriate  Insight: Appropriate  Engagement in Group:  Engaged  Modes of Intervention:  Discussion  Additional Comments:     Isabella [name] 10/17/2023, 8:41 PM

## 2023-10-17 NOTE — Plan of Care (Addendum)
  Problem: Education: Goal: Emotional status will improve Outcome: Progressing Goal: Mental status will improve Outcome: Progressing Goal: Verbalization of understanding the information provided will improve Outcome: Progressing   Problem: Coping: Goal: Ability to verbalize frustrations and anger appropriately will improve Outcome: Progressing Goal: Ability to demonstrate self-control will improve Outcome: Progressing   Problem: Health Behavior/Discharge Planning: Goal: Compliance with treatment plan for underlying cause of condition will improve Outcome: Progressing   Problem: Activity: Goal: Interest or engagement in activities will improve Outcome: Not Progressing Goal: Sleeping patterns will improve Outcome: Not Progressing

## 2023-10-17 NOTE — Progress Notes (Signed)
   10/16/23 2023  Psych Admission Type (Psych Patients Only)  Admission Status Voluntary  Psychosocial Assessment  Patient Complaints Depression  Eye Contact Fair  Facial Expression Flat  Affect Flat  Speech Logical/coherent  Interaction Minimal  Motor Activity Slow  Appearance/Hygiene Unremarkable  Behavior Characteristics Cooperative  Mood Depressed  Thought Process  Coherency WDL  Content WDL  Delusions None reported or observed  Perception WDL  Hallucination None reported or observed  Judgment Poor  Confusion None  Danger to Self  Current suicidal ideation? Denies  Danger to Others  Danger to Others None reported or observed  Danger to Others Abnormal  Harmful Behavior to others No threats or harm toward other people  Destructive Behavior No threats or harm toward property

## 2023-10-17 NOTE — Progress Notes (Addendum)
   10/17/23 0800  Psych Admission Type (Psych Patients Only)  Admission Status Voluntary  Psychosocial Assessment  Patient Complaints Depression;Anxiety  Eye Contact Fair  Facial Expression Flat  Affect Flat  Speech Logical/coherent  Interaction Minimal  Motor Activity Slow  Appearance/Hygiene In scrubs  Behavior Characteristics Cooperative  Mood Depressed;Anxious  Thought Process  Coherency WDL  Content WDL  Delusions None reported or observed  Perception WDL  Hallucination None reported or observed  Judgment Poor  Confusion None  Danger to Self  Current suicidal ideation? Denies  Danger to Others  Danger to Others None reported or observed  Danger to Others Abnormal  Harmful Behavior to others No threats or harm toward other people  Destructive Behavior No threats or harm toward property

## 2023-10-17 NOTE — Plan of Care (Signed)
   Problem: Education: Goal: Knowledge of [place] Education information/materials will improve Outcome: Progressing Goal: Emotional status will improve Outcome: Progressing

## 2023-10-17 NOTE — Group Note (Signed)
 Recreation Therapy Group Note   Group Topic:Coping Skills  Group Date: 10/17/2023 Start Time: 1000 End Time: 1050 Facilitators: Deatrice Factor, LRT, CTRS Location: Craft Room  Group Description: Mind Map.  Patient was provided a blank template of a diagram with 32 blank boxes in a tiered system, branching from the center (similar to a bubble chart). LRT directed patients to label the middle of the diagram "Coping Skills". LRT and patients then came up with 8 different coping skills as examples. Pt were directed to record their coping skills in the 2nd tier boxes closest to the center.  Patients would then share their coping skills with the group as LRT wrote them out. LRT gave a handout of 99 different coping skills at the end of group.   Goal Area(s) Addressed: Patients will be able to define "coping skills". Patient will identify new coping skills.  Patient will increase communication.   Affect/Mood: N/A   Participation Level: Did not attend    Clinical Observations/Individualized Feedback: Patient did not attend group.   Plan: Continue to engage patient in RT group sessions 2-3x/week.   Deatrice Factor, LRT, CTRS 10/17/2023 11:14 AM

## 2023-10-17 NOTE — Group Note (Signed)
 Date:  10/17/2023 Time:  6:09 PM  Group Topic/Focus:  Activity Group: The focus of this group is to promote activity for the patients and to encourage them to go outside to the courtyard and get some fresh air and some exercise.    Participation Level:  Did Not Attend   Marianna Shirk Byard Carranza 10/17/2023, 6:09 PM

## 2023-10-17 NOTE — [facility] Suicide Risk Assessment (Signed)
 [facility] INPATIENT:  Family/Significant Other Suicide Prevention Education  Suicide Prevention Education:  Contact Attempts: Isabella [name], mother, [phone],  has been identified by the patient as the family member/significant other with whom the patient will be residing, and identified as the person(s) who will aid the patient in the event of a mental health crisis.  With written consent from the patient, two attempts were made to provide suicide prevention education, prior to and/or following the patient's discharge.  We were unsuccessful in providing suicide prevention education.  A suicide education pamphlet was given to the patient to share with family/significant other.  Date and time of first attempt:10/10/2023 at 2:38PM  Date and time of second attempt: 10/12/2023 at 4:13PM Date and time of third attempt: 10/17/2023 at 11:49AM  CSW called using interpreter services, [place], [phone].  Call was not answered and HIPAA compliant voicemail was left.   [name] 10/17/2023, 11:48 AM

## 2023-10-17 NOTE — Group Note (Signed)
 Recreation Therapy Group Note   Group Topic:Other  Group Date: 10/17/2023 Start Time: 1500 End Time: 1600 Facilitators: Deatrice Factor, LRT, CTRS Location: Courtyard  Group Description: [employer]. LRT and patients played games of basketball, drew with chalk, and played corn hole while outside in the courtyard while getting fresh air and sunlight. Music was being played in the background. LRT and peers conversed about different games they have played before, what they do in their free time and anything else that is on their minds. LRT encouraged pts to drink water  after being outside, sweating and getting their heart rate up.  Goal Area(s) Addressed: Patient will build on frustration tolerance skills. Patients will partake in a competitive play game with peers. Patients will gain knowledge of new leisure interest/hobby.    Affect/Mood: N/A   Participation Level: Did not attend    Clinical Observations/Individualized Feedback: Patient did not attend group.   Plan: Continue to engage patient in RT group sessions 2-3x/week.   Deatrice Factor, LRT, CTRS 10/17/2023 4:47 PM

## 2023-10-17 NOTE — B[facility] Counselor (Signed)
 CSW contacted the nonemergency number for [facility] to request a welfare check on the patients mother.   CSW spoke with communications who reports that a police officer will complete the wellness check and call this CSW back.  Isabella [name], MSW, LCSW 10/17/2023 3:00 PM
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
## 2023-10-17 NOTE — B[facility] Counselor (Signed)
 CSW contacted the patient's mother, following Forensic psychologist with [employer] called.    CSW spoke with Isabella [name], mother, [phone] .  Interpreter service was used, [place], [id number].  Mother reports that the patient is often hospitalized for "her mental health". Mother reports that "this time she was very sleepy, and eating and not getting out of bed".   CSW inquired about ECT and if family would be able to assist patient in getting to any appointments IF CSW is able to assist in getting referred/scheduled. Mother reports that transportation is a barrier.   CSW will contact Medicaid transportation to see if assistance can be offered.   Mother confirms that pateint can come home at discharge.  Mother reported that family COULD pick patient up.  Mother expressed that patient IS NOT ready for d/c in her opinion following a recent visit.    Mother also reports that "when she goes to the hospital her legs get weak and she can't stand or walk and it takes her 8-10 days before she can walk again when she gets home". CSW expressed that she will inform the providers  Mother reports that patient is not a danger to self or others.  She denies access to weapons.   Shasta Deist, MSW, LCSW 10/17/2023 4:29 PM

## 2023-10-18 DIAGNOSIS — F250 Schizoaffective disorder, bipolar type: Secondary | ICD-10-CM | POA: Diagnosis not present

## 2023-10-18 DIAGNOSIS — F061 Catatonic disorder due to known physiological condition: Secondary | ICD-10-CM | POA: Diagnosis not present

## 2023-10-18 NOTE — Group Note (Signed)
 Date:  10/18/2023 Time:  11:14 AM  Group Topic/Focus:  Goals Group:   The focus of this group is to help patients establish daily goals to achieve during treatment and discuss how the patient can incorporate goal setting into their daily lives to aide in recovery.    Participation Level:  Did Not Attend   [name] [facility] 10/18/2023, 11:14 AM

## 2023-10-18 NOTE — Progress Notes (Signed)
 Pt said she feels a little better and although she mentions dizziness she has ambulated in hallway without difficulty.
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
## 2023-10-18 NOTE — BHH Group Notes (Signed)
 Psychoeducational Group Note  Date:  10/18/2023 Time:  2000  Group Topic/Focus:  Wrap up group  Participation Level: Did Not Attend  Participation Quality:  Not Applicable  Affect:  Not Applicable  Cognitive:  Not Applicable  Insight:  Not Applicable  Engagement in Group: Not Applicable  Additional Comments:  Did not attend.   Catharine Clock 10/18/2023, 9:19 PM

## 2023-10-18 NOTE — Group Note (Signed)
 [facility] LCSW Group Therapy Note   Group Date: 10/18/2023 Start Time: 1300 End Time: 1400   Type of Therapy and Topic: Group Therapy: Avoiding Self-Sabotaging and Enabling Behaviors  Participation Level: Did Not Attend  Mood:  Description of Group:  In this group, patients will learn how to identify obstacles, self-sabotaging and enabling behaviors, as well as: what are they, why do we do them and what needs these behaviors meet. Discuss unhealthy relationships and how to have positive healthy boundaries with those that sabotage and enable. Explore aspects of self-sabotage and enabling in yourself and how to limit these self-destructive behaviors in everyday life.   Therapeutic Goals: 1. Patient will identify one obstacle that relates to self-sabotage and enabling behaviors 2. Patient will identify one personal self-sabotaging or enabling behavior they did prior to admission 3. Patient will state a plan to change the above identified behavior 4. Patient will demonstrate ability to communicate their needs through discussion and/or role play.    Summary of Patient Progress:   Patient did not attend group.    Therapeutic Modalities:  Cognitive Behavioral Therapy Person-Centered Therapy Motivational Interviewing    Roselle Conner, LCSW

## 2023-10-18 NOTE — BHH Counselor (Signed)
 CSW researched ECT providers locally.  CSW was unable to identify a local ECT provider with [facility], all providers were in [facility].   CSW contacted [facility] - [address], [phone], [address] [place], [place] [address].  CSW was able to confirm that they provide ECT.  CSW was informed that provider would need to write recommendation and rationale on why ECT is needed.  Once received CSW to fax recommendation to [phone].  Staff with [facility] reports that there is a process to becoming established.  The referral will need to be reviewed and insurance reviewed.  She is unsure of a timeline but once complete someone will reach back out to this provider.   CSW attempted to contact [facility] Transportation and left HIPAA compliant voicemail requesting a return call.  Shasta Deist, MSW, LCSW 10/18/2023 9:44 AM

## 2023-10-18 NOTE — BH Assessment (Signed)
 Patient denies SI,HI and AVH. Patient reports having occasional dizziness. Patient PM lithium  dose was held due to it be over therapeutic. Patient was pleasant and cooperative. Took PM medications. No issues at the moment q15 safety checks are being performed.

## 2023-10-18 NOTE — Group Note (Signed)
 Date:  10/18/2023 Time:  5:53 PM  Group Topic/Focus:  Wellness Toolbox:   The focus of this group is to discuss various aspects of wellness, balancing those aspects and exploring ways to increase the ability to experience wellness.  Patients will create a wellness toolbox for use upon discharge.    Participation Level:  Did Not Attend   [name] [facility] 10/18/2023, 5:53 PM

## 2023-10-18 NOTE — Plan of Care (Signed)

## 2023-10-18 NOTE — Plan of Care (Signed)
   Problem: Education: Goal: Knowledge of [place] Education information/materials will improve Outcome: Progressing Goal: Emotional status will improve Outcome: Progressing

## 2023-10-19 DIAGNOSIS — F250 Schizoaffective disorder, bipolar type: Secondary | ICD-10-CM | POA: Diagnosis not present

## 2023-10-19 DIAGNOSIS — F061 Catatonic disorder due to known physiological condition: Secondary | ICD-10-CM | POA: Diagnosis not present

## 2023-10-19 NOTE — Group Note (Signed)
 [facility] LCSW Group Therapy Note   Group Date: 10/19/2023 Start Time: 1300 End Time: 1335   Type of Therapy/Topic:  Group Therapy:  Balance in Life  Participation Level:  Did Not Attend   Description of Group:    This group will address the concept of balance and how it feels and looks when one is unbalanced. Patients will be encouraged to process areas in their lives that are out of balance, and identify reasons for remaining unbalanced. Facilitators will guide patients utilizing problem- solving interventions to address and correct the stressor making their life unbalanced. Understanding and applying boundaries will be explored and addressed for obtaining  and maintaining a balanced life. Patients will be encouraged to explore ways to assertively make their unbalanced needs known to significant others in their lives, using other group members and facilitator for support and feedback.  Therapeutic Goals: Patient will identify two or more emotions or situations they have that consume much of in their lives. Patient will identify signs/triggers that life has become out of balance:  Patient will identify two ways to set boundaries in order to achieve balance in their lives:  Patient will demonstrate ability to communicate their needs through discussion and/or role plays  Summary of Patient Progress: Patient did not attend group.    Therapeutic Modalities:   Cognitive Behavioral Therapy Solution-Focused Therapy Assertiveness Training   Randolm Butte, LCSW

## 2023-10-19 NOTE — Plan of Care (Signed)
  Problem: Education: Goal: Emotional status will improve Outcome: Progressing Goal: Mental status will improve Outcome: Progressing   Problem: Activity: Goal: Sleeping patterns will improve Outcome: Progressing   Problem: Health Behavior/Discharge Planning: Goal: Compliance with treatment plan for underlying cause of condition will improve Outcome: Progressing   Problem: Physical Regulation: Goal: Ability to maintain clinical measurements within normal limits will improve Outcome: Progressing   Problem: Safety: Goal: Periods of time without injury will increase Outcome: Progressing

## 2023-10-19 NOTE — BHH Counselor (Signed)
 CSW heard back from [place], [facility].   Per them the patient's address in their system is in [place] and patient will need to change the address.  She also reports that patient has a managed plan and would need to go through [employer].  CSW to follow up with the patient.   Shasta Deist, MSW, LCSW 10/19/2023 10:07 AM

## 2023-10-19 NOTE — Group Note (Signed)
 Date:  10/19/2023 Time:  10:18 AM  Group Topic/Focus:   Goals Group:   The focus of this group is to help patients establish daily goals to achieve during treatment and discuss how the patient can incorporate goal setting into their daily lives to aide in recovery.   Participation Level:  Did Not Attend   Isabella [name] Isabella [name] 10/19/2023, 10:18 AM

## 2023-10-19 NOTE — Group Note (Signed)
 Recreation Therapy Group Note   Group Topic:Coping Skills  Group Date: 10/19/2023 Start Time: 1530 End Time: 1620 Facilitators: Yvonna Herder, CTRS Location: Craft Room  Group Description: Coping A-Z. LRT and patients engage in a guided discussion on what coping skills are and gave specific examples. LRT passed out a handout labeled Coping A-Z with blank spaces beside each letter. LRT prompted patients to come up with a coping skill for each of the letters. LRT and patients went over the handout and gave ideas for each letter if anyone had any blanks left on their paper. Patients kept this handout with them that listed 26 different coping skills.   Goal Area(s) Addressed: Patients will be able to define "coping skills". Patient will identify new coping skills.  Patient will increase communication.   Affect/Mood: N/A   Participation Level: Did not attend    Clinical Observations/Individualized Feedback: Patient did not attend group.   Plan: Continue to engage patient in RT group sessions 2-3x/week.   Deatrice Factor, LRT, CTRS 10/19/2023 5:32 PM

## 2023-10-19 NOTE — BHH Counselor (Addendum)
 ADDENDUM CSW received call back that ACTT, CST, Peer Support is not currently offered at [facility] despite CSW having spoken to reception to reported the opposite.   Shasta Deist, MSW, LCSW 10/19/2023 3:10 PM   CSW has sent referral to [facility] in [facility] for Peer Support/CST referral.  CSW has sent referral for ECT treatment to [facility] - [address]  CSW contacted [employer] for transportation assistance to her appointments.  Intake paperwork will have to be completed either in person or mailed to pts home.  CSW has asked for paperwork to be mailed.  CSW has provided patient contact information and reviewed this with the patient.   CSW received confirmation that both faxes were successful.  CSW contacted [facility] Medicaid to schedule transportation.  However, the subscriber number in chart does not match the one [facility] has on file.  Patient was unable to offer any additional assistance and card not scanned into chart.  Pt will need to follow up at discharge.  CSW has explained this to pt.    Shasta Deist, MSW, LCSW 10/19/2023 1:52 PM
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
## 2023-10-19 NOTE — Progress Notes (Signed)
   10/19/23 0931  Psych Admission Type (Psych Patients Only)  Admission Status Voluntary  Psychosocial Assessment  Patient Complaints None  Eye Contact Fair  Facial Expression Flat  Affect Flat  Speech Logical/coherent;Soft  Interaction Minimal  Motor Activity Slow  Appearance/Hygiene In scrubs;Unremarkable  Behavior Characteristics Cooperative  Mood Depressed;Pleasant  Thought Process  Coherency WDL  Content WDL  Delusions None reported or observed  Perception WDL  Hallucination None reported or observed  Judgment WDL  Confusion None  Danger to Self  Current suicidal ideation? Denies  Danger to Others  Danger to Others None reported or observed  Danger to Others Abnormal  Harmful Behavior to others No threats or harm toward other people  Destructive Behavior No threats or harm toward property

## 2023-10-19 NOTE — Plan of Care (Signed)

## 2023-10-19 NOTE — Group Note (Signed)
 Recreation Therapy Group Note   Group Topic:Relaxation  Group Date: 10/19/2023 Start Time: 1000 End Time: 1050 Facilitators: Deatrice Factor, LRT, CTRS Location: Craft Room  Group Description: PMR (Progressive Muscle Relaxation). LRT asks patients their current level of stress/anxiety from 1-10, with 10 being the highest. LRT educates patients on what PMR is and the benefits that come from it. Patients are asked to sit with their feet flat on the floor while sitting up and all the way back in their chair, if possible. LRT and pts follow a prompt through a speaker that requires you to tense and release different muscles in their body and focus on their breathing. During session, lights are off and soft music is being played. Pts are given a stress ball to use if needed. At the end of the prompt, LRT asks patients to rank their current levels of stress/anxiety from 1-10, 10 being the highest. LRT provides patients with an education handout on PMR.   Goal Area(s) Addressed:  Patients will be able to describe progressive muscle relaxation.  Patient will practice using relaxation technique. Patient will identify a new coping skill.  Patient will follow multistep directions to reduce anxiety and stress.   Affect/Mood: N/A   Participation Level: Did not attend    Clinical Observations/Individualized Feedback: Patient did not attend group.   Plan: Continue to engage patient in RT group sessions 2-3x/week.   Deatrice Factor, LRT, CTRS 10/19/2023 11:50 AM

## 2023-10-19 NOTE — Group Note (Signed)
 Date:  10/19/2023 Time:  8:47 PM  Group Topic/Focus:  Orientation:   The focus of this group is to educate the patient on the purpose and policies of crisis stabilization and provide a format to answer questions about their admission.  The group details unit policies and expectations of patients while admitted.    Participation Level:  Active  Participation Quality:  Appropriate, Attentive, and Sharing  Affect:  Appropriate  Cognitive:  Alert and Appropriate  Insight: Appropriate, Good, and Improving  Engagement in Group:  Developing/Improving and Engaged  Modes of Intervention:  Clarification, Education, Orientation, Rapport Building, and Support  Additional Comments:     Shedric Fredericks 10/19/2023, 8:47 PM

## 2023-10-19 NOTE — Progress Notes (Signed)
 Pt. Has been withdrawn to self and room throughout the entire shift. Resting on/off throughout the night. Refuse to come out to participated in evening group and HS snacks in the day area. Encouraged to drink fluids, accepted apple juice. Compliant with scheduled medications with some encouragement. Denied SI/SIB/HI and AH/VH. Very brief during 1:1 conversation. No complaints voiced. No distress noted/ reported a this time. Pt. Remains in bed resting, rise and fall of chest noted. All orders maintained as written.

## 2023-10-20 DIAGNOSIS — F250 Schizoaffective disorder, bipolar type: Secondary | ICD-10-CM | POA: Diagnosis not present

## 2023-10-20 DIAGNOSIS — F061 Catatonic disorder due to known physiological condition: Secondary | ICD-10-CM | POA: Diagnosis not present

## 2023-10-20 LAB — LITHIUM LEVEL: Lithium Lvl: 0.9 mmol/L (ref 0.60–1.20)

## 2023-10-20 LAB — TSH: TSH: 3.85 u[IU]/mL (ref 0.350–4.500)

## 2023-10-20 NOTE — Plan of Care (Signed)

## 2023-10-20 NOTE — Group Note (Signed)
 Date:  10/20/2023 Time:  10:16 AM  Group Topic/Focus:  Goals Group:   The focus of this group is to help patients establish daily goals to achieve during treatment and discuss how the patient can incorporate goal setting into their daily lives to aide in recovery.   Participation Level:  Did Not Attend   Isabella [name] A [name] 10/20/2023, 10:16 AM

## 2023-10-20 NOTE — Group Note (Signed)
 Recreation Therapy Group Note   Group Topic:General Recreation  Group Date: 10/20/2023 Start Time: 1515 End Time: 1620 Facilitators: Deatrice Factor, LRT, CTRS Location: Courtyard  Group Description: [employer]. LRT and patients played games of basketball, drew with chalk, and played corn hole while outside in the courtyard while getting fresh air and sunlight. Music was being played in the background. LRT and peers conversed about different games they have played before, what they do in their free time and anything else that is on their minds. LRT encouraged pts to drink water  after being outside, sweating and getting their heart rate up.  Goal Area(s) Addressed: Patient will build on frustration tolerance skills. Patients will partake in a competitive play game with peers. Patients will gain knowledge of new leisure interest/hobby.    Affect/Mood: N/A   Participation Level: Did not attend    Clinical Observations/Individualized Feedback: Patient did not attend group.   Plan: Continue to engage patient in RT group sessions 2-3x/week.   Deatrice Factor, LRT, CTRS 10/20/2023 5:04 PM

## 2023-10-20 NOTE — Progress Notes (Signed)
 Patient has been up and visible on the unit at the start of the shift. Spent some time talking on the telephone with family and sitting in the day area amongst peers eating HS snack. No complaints voiced. No distress noted/ reported at this time. Patient slept throughout the night without any problems or complaints. Medication compliant. Patient currently lying in bed appears to be resting, rise and fall of chest noted. All orders maintained as written.

## 2023-10-20 NOTE — Group Note (Signed)
 Date:  10/20/2023 Time:  9:07 PM  Group Topic/Focus:  Goals Group:   The focus of this group is to help patients establish daily goals to achieve during treatment and discuss how the patient can incorporate goal setting into their daily lives to aide in recovery. Recovery Goals:   The focus of this group is to identify appropriate goals for recovery and establish a plan to achieve them. Wellness Toolbox:   The focus of this group is to discuss various aspects of wellness, balancing those aspects and exploring ways to increase the ability to experience wellness.  Patients will create a wellness toolbox for use upon discharge.    Participation Level:  Active  Participation Quality:  Appropriate  Affect:  Appropriate  Cognitive:  Appropriate and Oriented  Insight: Appropriate  Engagement in Group:  Engaged  Modes of Intervention:  Activity, Discussion, and Support  Additional Comments:  N/A  Isabella [name] 10/20/2023, 9:07 PM

## 2023-10-20 NOTE — Plan of Care (Signed)
  Problem: Education: Goal: Emotional status will improve Outcome: Progressing Goal: Mental status will improve Outcome: Progressing   Problem: Coping: Goal: Ability to verbalize frustrations and anger appropriately will improve Outcome: Progressing   Problem: Health Behavior/Discharge Planning: Goal: Identification of resources available to assist in meeting health care needs will improve Outcome: Progressing   Problem: Safety: Goal: Periods of time without injury will increase Outcome: Progressing

## 2023-10-20 NOTE — Group Note (Signed)
 Recreation Therapy Group Note   Group Topic:Leisure Education  Group Date: 10/20/2023 Start Time: 1030 End Time: 1145 Facilitators: Deatrice Factor, LRT, CTRS Location: Craft Room  Group Description: Leisure. Patients were given the option to choose from singing karaoke, coloring mandalas, using oil pastels, journaling, or playing with play-doh. LRT and pts discussed the meaning of leisure, the importance of participating in leisure during their free time/when they're outside of the hospital, as well as how our leisure interests can also serve as coping skills.   Goal Area(s) Addressed:  Patient will identify a current leisure interest.  Patient will learn the definition of "leisure". Patient will practice making a positive decision. Patient will have the opportunity to try a new leisure activity. Patient will communicate with peers and LRT.    Affect/Mood: N/A   Participation Level: Did not attend    Clinical Observations/Individualized Feedback: Patient did not attend group.   Plan: Continue to engage patient in RT group sessions 2-3x/week.   Deatrice Factor, LRT, CTRS 10/20/2023 1:45 PM
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
## 2023-10-20 NOTE — Progress Notes (Signed)
   10/20/23 2100  Psych Admission Type (Psych Patients Only)  Admission Status Voluntary  Psychosocial Assessment  Patient Complaints Sadness  Eye Contact Fair  Facial Expression Sad  Affect Sad;Depressed  Speech Logical/coherent  Interaction Minimal  Motor Activity Slow  Appearance/Hygiene In scrubs;Unremarkable  Behavior Characteristics Cooperative  Mood Depressed;Sad  Thought Process  Coherency WDL  Content WDL  Delusions None reported or observed  Perception WDL  Hallucination None reported or observed  Judgment WDL  Confusion None  Danger to Self  Current suicidal ideation? Denies  Danger to Others  Danger to Others None reported or observed  Danger to Others Abnormal  Harmful Behavior to others No threats or harm toward other people  Destructive Behavior No threats or harm toward property   Pt required prn Antivert for dizziness.

## 2023-10-21 DIAGNOSIS — F250 Schizoaffective disorder, bipolar type: Secondary | ICD-10-CM | POA: Diagnosis not present

## 2023-10-21 DIAGNOSIS — F061 Catatonic disorder due to known physiological condition: Secondary | ICD-10-CM | POA: Diagnosis not present

## 2023-10-21 NOTE — Plan of Care (Signed)

## 2023-10-21 NOTE — BH IP Treatment Plan (Signed)
 Interdisciplinary Treatment and Diagnostic Plan Update  10/21/2023 Time of Session: 11:50 am Isabella [name] MRN: [id number]  Principal Diagnosis: Schizoaffective disorder, bipolar type, with catatonia (HCC)  Secondary Diagnoses: Principal Problem:   Schizoaffective disorder, bipolar type, with catatonia (HCC)   Current Medications:  Current Facility-Administered Medications  Medication Dose Route Frequency Provider Last Rate Last Admin   acetaminophen  (TYLENOL ) tablet 650 mg  650 mg Oral Q6H PRN McLauchlin, Angela, NP       alum & mag hydroxide-simeth (MAALOX/MYLANTA) 200-200-20 MG/5ML suspension 30 mL  30 mL Oral Q4H PRN McLauchlin, Angela, NP       atorvastatin  (LIPITOR) tablet 20 mg  20 mg Oral Daily Jadapalle, Sree, MD   20 mg at 10/21/23 0810   cyanocobalamin  (VITAMIN B12) tablet 1,000 mcg  1,000 mcg Oral Daily Jadapalle, Sree, MD   1,000 mcg at 10/21/23 0810   haloperidol  (HALDOL ) tablet 5 mg  5 mg Oral TID PRN McLauchlin, Angela, NP       And   diphenhydrAMINE  (BENADRYL ) capsule 50 mg  50 mg Oral TID PRN McLauchlin, Shelvy Dickens, NP       haloperidol  lactate (HALDOL ) injection 5 mg  5 mg Intramuscular TID PRN McLauchlin, Shelvy Dickens, NP       And   diphenhydrAMINE  (BENADRYL ) injection 50 mg  50 mg Intramuscular TID PRN McLauchlin, Shelvy Dickens, NP       And   LORazepam  (ATIVAN ) injection 2 mg  2 mg Intramuscular TID PRN McLauchlin, Shelvy Dickens, NP       haloperidol  lactate (HALDOL ) injection 10 mg  10 mg Intramuscular TID PRN McLauchlin, Angela, NP       And   diphenhydrAMINE  (BENADRYL ) injection 50 mg  50 mg Intramuscular TID PRN McLauchlin, Angela, NP       And   LORazepam  (ATIVAN ) injection 2 mg  2 mg Intramuscular TID PRN McLauchlin, Angela, NP       FLUoxetine  (PROZAC ) capsule 60 mg  60 mg Oral Daily Jadapalle, Sree, MD   60 mg at 10/21/23 4098   hydrOXYzine  (ATARAX ) tablet 25 mg  25 mg Oral TID PRN McLauchlin, Angela, NP       lithium  carbonate (LITHOBID ) ER tablet 600 mg  600 mg Oral  Q12H Millington, Matthew E, PA-C   600 mg at 10/21/23 1191   LORazepam  (ATIVAN ) tablet 0.5 mg  0.5 mg Oral BID Jadapalle, Sree, MD   0.5 mg at 10/21/23 4782   magnesium  hydroxide (MILK OF MAGNESIA) suspension 30 mL  30 mL Oral Daily PRN McLauchlin, Angela, NP       meclizine  (ANTIVERT ) tablet 25 mg  25 mg Oral TID PRN Shrivastava, Aryendra, MD   25 mg at 10/20/23 2128   metFORMIN  (GLUCOPHAGE ) tablet 500 mg  500 mg Oral Q breakfast Jadapalle, Sree, MD   500 mg at 10/21/23 0810   midodrine  (PROAMATINE ) tablet 10 mg  10 mg Oral TID PRN Shrivastava, Aryendra, MD       paliperidone  (INVEGA ) 24 hr tablet 3 mg  3 mg Oral Daily Shrivastava, Aryendra, MD   3 mg at 10/21/23 0810   traZODone  (DESYREL ) tablet 100 mg  100 mg Oral QHS Jadapalle, Sree, MD   100 mg at 10/20/23 2106   PTA Medications: Medications Prior to Admission  Medication Sig Dispense Refill Last Dose/Taking   atorvastatin  (LIPITOR) 20 MG tablet Take 1 tablet (20 mg total) by mouth daily. 30 tablet 0    cholecalciferol  (CHOLECALCIFEROL ) 25 MCG tablet Take 1 tablet (1,000 Units total)  by mouth daily. 30 tablet 0    cyanocobalamin  1000 MCG tablet Take 1 tablet (1,000 mcg total) by mouth daily. 30 tablet 0    FLUoxetine  (PROZAC ) 20 MG capsule Take 3 capsules (60 mg total) by mouth daily. 90 capsule 3    lithium  carbonate (LITHOBID ) 300 MG ER tablet Take 2 tablets (600 mg total) by mouth every 12 (twelve) hours. 60 tablet 0    LORazepam  (ATIVAN ) 0.5 MG tablet Take 1 tablet (0.5 mg total) by mouth 2 (two) times daily. 30 tablet 0    metFORMIN  (GLUCOPHAGE ) 500 MG tablet Take 1 tablet (500 mg total) by mouth daily with breakfast. 30 tablet 0     Patient Stressors:    Patient Strengths:    Treatment Modalities: Medication Management, Group therapy, Case management,  1 to 1 session with clinician, Psychoeducation, Recreational therapy.   Physician Treatment Plan for Primary Diagnosis: Schizoaffective disorder, bipolar type, with catatonia  (HCC) Long Term Goal(s): Improvement in symptoms so as ready for discharge   Short Term Goals: Ability to identify changes in lifestyle to reduce recurrence of condition will improve Ability to verbalize feelings will improve Ability to disclose and discuss suicidal ideas Ability to demonstrate self-control will improve Ability to identify and develop effective coping behaviors will improve  Medication Management: Evaluate patient's response, side effects, and tolerance of medication regimen.  Therapeutic Interventions: 1 to 1 sessions, Unit Group sessions and Medication administration.  Evaluation of Outcomes: Progressing  Physician Treatment Plan for Secondary Diagnosis: Principal Problem:   Schizoaffective disorder, bipolar type, with catatonia (HCC)  Long Term Goal(s): Improvement in symptoms so as ready for discharge   Short Term Goals: Ability to identify changes in lifestyle to reduce recurrence of condition will improve Ability to verbalize feelings will improve Ability to disclose and discuss suicidal ideas Ability to demonstrate self-control will improve Ability to identify and develop effective coping behaviors will improve     Medication Management: Evaluate patient's response, side effects, and tolerance of medication regimen.  Therapeutic Interventions: 1 to 1 sessions, Unit Group sessions and Medication administration.  Evaluation of Outcomes: Progressing   RN Treatment Plan for Primary Diagnosis: Schizoaffective disorder, bipolar type, with catatonia (HCC) Long Term Goal(s): Knowledge of disease and therapeutic regimen to maintain health will improve  Short Term Goals: Ability to remain free from injury will improve, Ability to verbalize frustration and anger appropriately will improve, Ability to demonstrate self-control, Ability to participate in decision making will improve, Ability to verbalize feelings will improve, Ability to disclose and discuss suicidal  ideas, Ability to identify and develop effective coping behaviors will improve, and Compliance with prescribed medications will improve  Medication Management: RN will administer medications as ordered by provider, will assess and evaluate patient's response and provide education to patient for prescribed medication. RN will report any adverse and/or side effects to prescribing provider.  Therapeutic Interventions: 1 on 1 counseling sessions, Psychoeducation, Medication administration, Evaluate responses to treatment, Monitor vital signs and CBGs as ordered, Perform/monitor CIWA, COWS, AIMS and Fall Risk screenings as ordered, Perform wound care treatments as ordered.  Evaluation of Outcomes: Progressing   LCSW Treatment Plan for Primary Diagnosis: Schizoaffective disorder, bipolar type, with catatonia (HCC) Long Term Goal(s): Safe transition to appropriate next level of care at discharge, Engage patient in therapeutic group addressing interpersonal concerns.  Short Term Goals: Engage patient in aftercare planning with referrals and resources, Increase social support, Increase ability to appropriately verbalize feelings, Increase emotional regulation, Facilitate acceptance of mental  health diagnosis and concerns, and Increase skills for wellness and recovery  Therapeutic Interventions: Assess for all discharge needs, 1 to 1 time with Social worker, Explore available resources and support systems, Assess for adequacy in community support network, Educate family and significant other(s) on suicide prevention, Complete Psychosocial Assessment, Interpersonal group therapy.  Evaluation of Outcomes: Progressing   Progress in Treatment: Attending groups: Yes. and No. Participating in groups: No. Taking medication as prescribed: Yes. Toleration medication: Yes. Family/Significant other contact made: Yes, individual(s) contacted:  Nedra Ball, mother, [phone]  Patient understands diagnosis:  Yes. Discussing patient identified problems/goals with staff: Yes. Medical problems stabilized or resolved: Yes. Denies suicidal/homicidal ideation: Yes. Issues/concerns per patient self-inventory: No. Other: None  New problem(s) identified: No, Describe:  None Update 10/16/2023:  No changes at this time.  Update 10/21/2023: No changes at this time.    New Short Term/Long Term Goal(s):detox, elimination of symptoms of psychosis, medication management for mood stabilization; elimination of SI thoughts; development of comprehensive mental wellness/sobriety plan.  Update 10/16/2023:  No changes at this time.   Update 10/21/2023: No changes at this time.    Patient Goals:  "Getting better." Update 10/16/2023:  No changes at this time.   Update 10/21/2023: No changes at this time.    Discharge Plan or Barriers: CSW to assist in the development of appropriate discharge plan.  Update 10/16/2023:  Patient reports a desire for ECT but has transportation concerns on  being able to get there and home.  Patient also would benefit from an ACT team but there is not a provider in the area.     Update 10/21/2023: No changes at this time.    Reason for Continuation of Hospitalization: Anxiety Depression Suicidal ideation   Estimated Length of Stay: 1-7 days. Update 10/16/2023:  TBD   Update 10/21/2023: TBD    Last 3 [place] Suicide Severity Risk Score: Flowsheet Row Admission (Current) from 10/10/2023 in [facility]PAT[facility]INE ED from 10/09/2023 in [facility] Emergency Department at [facility] Admission (Discharged) from 08/03/2023 in [facility] INPATIENT BEHAVIORAL MEDICINE  C-SSRS RISK CATEGORY No Risk No Risk No Risk       Last PHQ 2/9 Scores:    07/16/2023   11:42 PM  Depression screen PHQ 2/9  Decreased Interest 1  Down, Depressed, Hopeless 1  PHQ - 2 Score 2  Altered sleeping 1  Tired, decreased energy 1  Change in appetite 1  Feeling bad or failure about yourself  1  Trouble  concentrating 1  Moving slowly or fidgety/restless 1  Suicidal thoughts 0  PHQ-9 Score 8    Scribe for Treatment Team: Santina Cull, LCSW 10/21/2023 11:50 AM

## 2023-10-21 NOTE — Group Note (Signed)
 Date:  10/21/2023 Time:  6:07 PM  Group Topic/Focus:  Coping With Mental Health Crisis:   The purpose of this group is to help patients identify strategies for coping with mental health crisis.  Group discusses possible causes of crisis and ways to manage them effectively.  The purpose of this group is to help the patients identify that going outside and exercise is a great coping mechanism with mental health crisis. Healthy communication and team building exercises outside is a very therapeutic and healthy way to relieve stress.   Participation Level:  Did Not Attend  Participation Quality:    Affect:    Cognitive:    Insight:   Engagement in Group:    Modes of Intervention:    Additional Comments:    Ashanta Amoroso L Billy Turvey 10/21/2023, 6:07 PM

## 2023-10-21 NOTE — Group Note (Signed)
 Date:  10/21/2023 Time:  1:05 PM  Group Topic/Focus:  Healthy Communication:   The focus of this group is to discuss communication, barriers to communication, as well as healthy ways to communicate with others. Personal Choices and Values:   The focus of this group is to help patients assess and explore the importance of values in their lives, how their values affect their decisions, how they express their values and what opposes their expression.    Participation Level:  Did Not Attend  Participation Quality:    Affect:    Cognitive:   Insight:   Engagement in Group:    Modes of Intervention:    Additional Comments:    Isabella [name] [name] 10/21/2023, 1:05 PM
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
## 2023-10-21 NOTE — Progress Notes (Signed)
 In bed mood depressed, affect flat and withdrawn.  Reports depression improved a little bit; continues to endorse depression and anxiety, both rated 6/10.  She denied SI And AVH. Pt remains medication compliant.  Pt, noted on phone call, reports spoke with family and they are supportive.    10/21/23 2200  Psych Admission Type (Psych Patients Only)  Admission Status Voluntary  Psychosocial Assessment  Patient Complaints Sadness;Depression  Eye Contact Fair  Facial Expression Sad  Affect Depressed;Sad  Speech Logical/coherent  Interaction Minimal  Motor Activity Slow  Appearance/Hygiene Unremarkable;In scrubs  Behavior Characteristics Cooperative;Appropriate to situation  Mood Depressed;Sad  Aggressive Behavior  Effect No apparent injury  Thought Process  Coherency WDL  Content WDL  Delusions None reported or observed  Perception WDL  Hallucination None reported or observed  Judgment WDL  Confusion None  Danger to Self  Current suicidal ideation? Denies  Danger to Others  Danger to Others None reported or observed  Danger to Others Abnormal  Harmful Behavior to others No threats or harm toward other people  Destructive Behavior No threats or harm toward property

## 2023-10-21 NOTE — Group Note (Signed)
 Date:  10/21/2023 Time:  1:30 PM  Group Topic/Focus:  Coping With Mental Health Crisis:   The purpose of this group is to help patients identify strategies for coping with mental health crisis.  Group discusses possible causes of crisis and ways to manage them effectively. Healthy Communication:   The focus of this group is to discuss communication, barriers to communication, as well as healthy ways to communicate with others.    Participation Level:  Did Not Attend  Participation Quality:    Affect:    Cognitive:    Insight:   Engagement in Group:    Modes of Intervention:    Additional Comments:    Oneisha Ammons L Bralin Garry 10/21/2023, 1:30 PM

## 2023-10-21 NOTE — Group Note (Signed)
 Date:  10/21/2023 Time:  8:50 PM  Group Topic/Focus:  Wrap-Up Group:   The focus of this group is to help patients review their daily goal of treatment and discuss progress on daily workbooks.    Participation Level:  Active  Participation Quality:  Appropriate  Affect:  Appropriate  Cognitive:  Alert and Appropriate  Insight: Appropriate and Good  Engagement in Group:  Developing/Improving  Modes of Intervention:  Discussion, Rapport Building, Socialization, and Support  Additional Comments:     Isabella [name] 10/21/2023, 8:50 PM

## 2023-10-21 NOTE — Progress Notes (Signed)
   10/21/23 0810  Psych Admission Type (Psych Patients Only)  Admission Status Voluntary  Psychosocial Assessment  Patient Complaints Sadness  Eye Contact Fair  Facial Expression Sad  Affect Sad;Depressed  Speech Logical/coherent  Interaction Minimal  Motor Activity Slow  Appearance/Hygiene Unremarkable;In scrubs  Behavior Characteristics Cooperative  Mood Sad;Depressed  Aggressive Behavior  Effect No apparent injury  Thought Process  Coherency WDL  Content WDL  Delusions None reported or observed  Perception WDL  Hallucination None reported or observed  Judgment WDL  Confusion None  Danger to Self  Current suicidal ideation? Denies  Danger to Others  Danger to Others None reported or observed  Danger to Others Abnormal  Harmful Behavior to others No threats or harm toward other people  Destructive Behavior No threats or harm toward property

## 2023-10-21 NOTE — Plan of Care (Signed)
  Problem: Education: Goal: Knowledge of [facility] Education information/materials will improve 10/21/2023 1457 by Derenda Flax, RN Outcome: Progressing 10/21/2023 1401 by Derenda Flax, RN Outcome: Not Progressing Goal: Emotional status will improve 10/21/2023 1457 by Derenda Flax, RN Outcome: Progressing 10/21/2023 1401 by Derenda Flax, RN Outcome: Not Progressing Goal: Mental status will improve 10/21/2023 1457 by Derenda Flax, RN Outcome: Progressing 10/21/2023 1401 by Derenda Flax, RN Outcome: Not Progressing Goal: Verbalization of understanding the information provided will improve 10/21/2023 1457 by Derenda Flax, RN Outcome: Progressing 10/21/2023 1401 by Derenda Flax, RN Outcome: Not Progressing   Problem: Activity: Goal: Interest or engagement in activities will improve 10/21/2023 1457 by Derenda Flax, RN Outcome: Progressing 10/21/2023 1401 by Derenda Flax, RN Outcome: Not Progressing Goal: Sleeping patterns will improve 10/21/2023 1457 by Derenda Flax, RN Outcome: Progressing 10/21/2023 1401 by Derenda Flax, RN Outcome: Not Progressing   Problem: Coping: Goal: Ability to verbalize frustrations and anger appropriately will improve 10/21/2023 1457 by Derenda Flax, RN Outcome: Progressing 10/21/2023 1401 by Derenda Flax, RN Outcome: Not Progressing Goal: Ability to demonstrate self-control will improve 10/21/2023 1457 by Derenda Flax, RN Outcome: Progressing 10/21/2023 1401 by Derenda Flax, RN Outcome: Not Progressing   Problem: Health Behavior/Discharge Planning: Goal: Identification of resources available to assist in meeting health care needs will improve 10/21/2023 1457 by Derenda Flax, RN Outcome: Progressing 10/21/2023 1401 by Derenda Flax, RN Outcome: Not Progressing Goal: Compliance with treatment plan for underlying cause of condition will improve 10/21/2023 1457 by Derenda Flax,  RN Outcome: Progressing 10/21/2023 1401 by Derenda Flax, RN Outcome: Not Progressing   Problem: Physical Regulation: Goal: Ability to maintain clinical measurements within normal limits will improve 10/21/2023 1457 by Derenda Flax, RN Outcome: Progressing 10/21/2023 1401 by Derenda Flax, RN Outcome: Not Progressing   Problem: Safety: Goal: Periods of time without injury will increase 10/21/2023 1457 by Derenda Flax, RN Outcome: Progressing 10/21/2023 1401 by Derenda Flax, RN Outcome: Not Progressing

## 2023-10-22 DIAGNOSIS — F250 Schizoaffective disorder, bipolar type: Secondary | ICD-10-CM | POA: Diagnosis not present

## 2023-10-22 DIAGNOSIS — F061 Catatonic disorder due to known physiological condition: Secondary | ICD-10-CM | POA: Diagnosis not present

## 2023-10-22 NOTE — Group Note (Signed)
 Date:  10/22/2023 Time:  5:01 PM  Group Topic/Focus:  Activity Group: The focus of the group is to promote activity for the patients and encourage them to go outside to the courtyard and let them get some fresh air and exercise.    Participation Level:  Did Not Attend   Marianna Shirk Charnetta Wulff 10/22/2023, 5:01 PM

## 2023-10-22 NOTE — Group Note (Signed)
 Date:  10/22/2023 Time:  9:41 PM  Group Topic/Focus:  Wrap-Up Group:   The focus of this group is to help patients review their daily goal of treatment and discuss progress on daily workbooks.    Participation Level:  Did Not Attend    Isabella [name] 10/22/2023, 9:41 PM

## 2023-10-22 NOTE — [facility] Counselor (Signed)
 CSW placed a call to pt's mother, Belynda Brace, [phone] to inquire about insurance information for the pt. Ms. Artis Binder states she does have an insurance card for the pt however was unable to read the information over the phone. She states she will bring a copy of the insurance card to the hospital if she is able to receive transportation.

## 2023-10-22 NOTE — Plan of Care (Signed)

## 2023-10-22 NOTE — Plan of Care (Signed)
   Problem: Education: Goal: Knowledge of Isabella [name] General Education information/materials will improve Outcome: Progressing Goal: Emotional status will improve Outcome: Progressing Goal: Mental status will improve Outcome: Progressing Goal: Verbalization of understanding the information provided will improve Outcome: Progressing   Problem: Activity: Goal: Interest or engagement in activities will improve Outcome: Progressing Goal: Sleeping patterns will improve Outcome: Progressing   Problem: Coping: Goal: Ability to verbalize frustrations and anger appropriately will improve Outcome: Progressing Goal: Ability to demonstrate self-control will improve Outcome: Progressing
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
## 2023-10-22 NOTE — Progress Notes (Signed)
   10/22/23 0816  Psych Admission Type (Psych Patients Only)  Admission Status Voluntary  Psychosocial Assessment  Patient Complaints Depression;Sadness  Eye Contact Fair  Facial Expression Sad  Affect Depressed;Sad  Speech Logical/coherent  Interaction Minimal  Motor Activity Slow  Appearance/Hygiene Unremarkable;In scrubs  Behavior Characteristics Cooperative;Appropriate to situation  Mood Depressed;Sad  Aggressive Behavior  Effect No apparent injury  Thought Process  Coherency WDL  Content WDL  Delusions None reported or observed  Perception WDL  Hallucination None reported or observed  Judgment WDL  Confusion None  Danger to Self  Current suicidal ideation? Denies  Danger to Others  Danger to Others None reported or observed  Danger to Others Abnormal  Harmful Behavior to others No threats or harm toward other people  Destructive Behavior No threats or harm toward property

## 2023-10-22 NOTE — Group Note (Signed)
 Date:  10/22/2023 Time:  12:00 PM  Group Topic/Focus:  Goals Group:   The focus of this group is to help patients establish daily goals to achieve during treatment and discuss how the patient can incorporate goal setting into their daily lives to aide in recovery.    Participation Level:  Did Not Attend  Participation Quality:    Affect:    Cognitive:    Insight:   Engagement in Group:    Modes of Intervention:    Additional Comments:    Isabella [name] 10/22/2023, 12:00 PM

## 2023-10-23 LAB — LITHIUM LEVEL: Lithium Lvl: 1.08 mmol/L (ref 0.60–1.20)

## 2023-10-23 NOTE — B[facility] Counselor (Signed)
 CSW called to check on ECT referral with [facility] - [address] [phone].  Kayleen Party, reception staff reports that the referral has not been uploaded to her chart at this time.   Kayleen Party reports that she will reach out to [facility], the ECT coordinator to call this CSW back.  Shasta Deist, MSW, LCSW 10/23/2023 3:13 PM

## 2023-10-23 NOTE — Group Note (Signed)
 Date:  10/23/2023 Time:  9:16 PM  Group Topic/Focus:  Coping With Mental Health Crisis:   The purpose of this group is to help patients identify strategies for coping with mental health crisis.  Group discusses possible causes of crisis and ways to manage them effectively. Self Care:   The focus of this group is to help patients understand the importance of self-care in order to improve or restore emotional, physical, spiritual, interpersonal, and financial health.    Participation Level:  Active  Participation Quality:  Appropriate  Affect:  Appropriate  Cognitive:  Appropriate  Insight: Appropriate  Engagement in Group:  Engaged and Supportive  Modes of Intervention:  Discussion, Education, and Support  Additional Comments:  n/a  [name] L 10/23/2023, 9:16 PM

## 2023-10-23 NOTE — BHH Counselor (Signed)
 CSW spoke with the patient's mother, Belynda Brace, [phone] using interpreter services, Lynetta Saran [phone].  CSW inquired if mother could take photo of pt's insurance card so that CSW can assist in scheduling appointments.  Mother reports that she is unable to do this on her current phone.   Mother reports plans to visit the patient this evening and will bring the card for staff to photocopy.  CSW to assist with this in the future.   Mother reports that she will be unable to pick the patient up tomorrow, however, will be home to receive patient if sent by safe transport.  She confirmed the address in the chart.    Shasta Deist, MSW, LCSW 10/23/2023 3:51 PM

## 2023-10-23 NOTE — Group Note (Signed)
 Date:  10/23/2023 Time:  5:27 PM  Group Topic/Focus:  Activity Group: The main focus of the group is to promote activity for the patients and encourage them to go outside to the courtyard and get some fresh air and some exercise.    Participation Level:  Did Not Attend   Marianna Shirk Terese Heier 10/23/2023, 5:27 PM

## 2023-10-23 NOTE — Group Note (Signed)
 Recreation Therapy Group Note   Group Topic:Healthy Support Systems  Group Date: 10/23/2023 Start Time: 1040 End Time: 1130 Facilitators: Deatrice Factor, LRT, CTRS Location: Craft Room  Group Description: Straw Bridge. In groups or individually, patients were given 10 plastic drinking straws and an equal length of masking tape. Using the materials provided, patients were instructed to build a free-standing bridge-like structure to suspend an everyday item (ex: deck of cards) off the floor or table surface. All materials were required to be used in Secondary school teacher. LRT facilitated post-activity discussion reviewing the importance of having strong and healthy support systems in our lives. LRT discussed how the people in our lives serve as the tape and the deck of cards we placed on top of our straw structure are the stressors we face in daily life. LRT and pts discussed what happens in our life when things get too heavy for us , and we don't have strong supports outside of the hospital. Pt shared 2 of their healthy supports in their life aloud in the group.   Goal Area(s) Addressed:  Patient will identify 2 healthy supports in their life. Patient will identify skills to successfully complete activity. Patient will identify correlation of this activity to life post-discharge.  Patient will build on frustration tolerance skills. Patient will increase team building and communication skills.    Affect/Mood: N/A   Participation Level: Did not attend    Clinical Observations/Individualized Feedback: Patient did not attend group.   Plan: Continue to engage patient in RT group sessions 2-3x/week.   Deatrice Factor, LRT, CTRS 10/23/2023 1:28 PM

## 2023-10-23 NOTE — Progress Notes (Signed)
   10/23/23 0900  Psych Admission Type (Psych Patients Only)  Admission Status Voluntary  Psychosocial Assessment  Patient Complaints Depression  Eye Contact Fair  Facial Expression Other (Comment) (WNL)  Affect Depressed  Speech Logical/coherent  Interaction Minimal  Motor Activity Slow  Appearance/Hygiene In scrubs  Behavior Characteristics Cooperative;Appropriate to situation  Mood Pleasant  Aggressive Behavior  Effect No apparent injury  Thought Process  Coherency WDL  Content WDL  Delusions None reported or observed  Perception WDL  Hallucination None reported or observed  Judgment Impaired  Confusion None  Danger to Self  Current suicidal ideation? Denies  Danger to Others  Danger to Others None reported or observed  Danger to Others Abnormal  Harmful Behavior to others No threats or harm toward other people  Destructive Behavior No threats or harm toward property   Patient stated that she is feeling better. Visible in the milieu. No dizziness verbalized today. Support and encouragement given.

## 2023-10-23 NOTE — Plan of Care (Signed)
  Problem: Education: Goal: Emotional status will improve Outcome: Progressing Goal: Mental status will improve Outcome: Progressing   Problem: Coping: Goal: Ability to verbalize frustrations and anger appropriately will improve Outcome: Progressing   Problem: Health Behavior/Discharge Planning: Goal: Identification of resources available to assist in meeting health care needs will improve Outcome: Progressing   Problem: Physical Regulation: Goal: Ability to maintain clinical measurements within normal limits will improve Outcome: Progressing   Problem: Safety: Goal: Periods of time without injury will increase Outcome: Progressing
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
## 2023-10-23 NOTE — Group Note (Signed)
 Date:  10/23/2023 Time:  11:47 AM  Group Topic/Focus:  Wellness Toolbox:   The focus of this group is to discuss various aspects of wellness, balancing those aspects and exploring ways to increase the ability to experience wellness.  Patients will create a wellness toolbox for use upon discharge.    Participation Level:  Did Not Attend   Marianna Shirk Jonpaul Lumm 10/23/2023, 11:47 AM

## 2023-10-23 NOTE — Plan of Care (Signed)
 Isabella [name] is a 48 y.o. female patient. No diagnosis found. Past Medical History:  Diagnosis Date   Bipolar disorder (HCC)    Hypertension    Migraine 07/13/2016   Schizophrenia (HCC)    Current Facility-Administered Medications  Medication Dose Route Frequency Provider Last Rate Last Admin   acetaminophen  (TYLENOL ) tablet 650 mg  650 mg Oral Q6H PRN McLauchlin, Angela, NP       alum & mag hydroxide-simeth (MAALOX/MYLANTA) 200-200-20 MG/5ML suspension 30 mL  30 mL Oral Q4H PRN McLauchlin, Angela, NP       atorvastatin  (LIPITOR) tablet 20 mg  20 mg Oral Daily Jadapalle, Sree, MD   20 mg at 10/22/23 8295   cyanocobalamin  (VITAMIN B12) tablet 1,000 mcg  1,000 mcg Oral Daily Jadapalle, Sree, MD   1,000 mcg at 10/22/23 6213   haloperidol  (HALDOL ) tablet 5 mg  5 mg Oral TID PRN McLauchlin, Angela, NP       And   diphenhydrAMINE  (BENADRYL ) capsule 50 mg  50 mg Oral TID PRN McLauchlin, Shelvy Dickens, NP       haloperidol  lactate (HALDOL ) injection 5 mg  5 mg Intramuscular TID PRN McLauchlin, Shelvy Dickens, NP       And   diphenhydrAMINE  (BENADRYL ) injection 50 mg  50 mg Intramuscular TID PRN McLauchlin, Shelvy Dickens, NP       And   LORazepam  (ATIVAN ) injection 2 mg  2 mg Intramuscular TID PRN McLauchlin, Angela, NP       haloperidol  lactate (HALDOL ) injection 10 mg  10 mg Intramuscular TID PRN McLauchlin, Shelvy Dickens, NP       And   diphenhydrAMINE  (BENADRYL ) injection 50 mg  50 mg Intramuscular TID PRN McLauchlin, Angela, NP       And   LORazepam  (ATIVAN ) injection 2 mg  2 mg Intramuscular TID PRN McLauchlin, Angela, NP       FLUoxetine  (PROZAC ) capsule 60 mg  60 mg Oral Daily Jadapalle, Sree, MD   60 mg at 10/22/23 0865   hydrOXYzine  (ATARAX ) tablet 25 mg  25 mg Oral TID PRN McLauchlin, Angela, NP       lithium  carbonate (LITHOBID ) ER tablet 600 mg  600 mg Oral Q12H Millington, Matthew E, PA-C   600 mg at 10/22/23 2100   LORazepam  (ATIVAN ) tablet 0.5 mg  0.5 mg Oral BID Jadapalle, Sree, MD   0.5 mg at  10/22/23 1632   magnesium  hydroxide (MILK OF MAGNESIA) suspension 30 mL  30 mL Oral Daily PRN McLauchlin, Angela, NP       meclizine  (ANTIVERT ) tablet 25 mg  25 mg Oral TID PRN Shrivastava, Aryendra, MD   25 mg at 10/20/23 2128   metFORMIN  (GLUCOPHAGE ) tablet 500 mg  500 mg Oral Q breakfast Jadapalle, Sree, MD   500 mg at 10/22/23 7846   midodrine  (PROAMATINE ) tablet 10 mg  10 mg Oral TID PRN Shrivastava, Aryendra, MD       paliperidone  (INVEGA ) 24 hr tablet 3 mg  3 mg Oral Daily Shrivastava, Aryendra, MD   3 mg at 10/22/23 0816   traZODone  (DESYREL ) tablet 100 mg  100 mg Oral QHS Jadapalle, Sree, MD   100 mg at 10/22/23 2145   No Known Allergies Principal Problem:   Schizoaffective disorder, bipolar type, with catatonia (HCC)  Blood pressure 106/70, pulse 74, temperature 97.8 F (36.6 C), temperature source Oral, resp. rate 20, height 5\' 4"  (1.626 m), weight 88 kg, last menstrual period 09/13/2023, SpO2 96%.   Patient was pleasant. Participated in group and  denies any SI or HI this shift. Took meds and no issues reported.  Mafalda Mcginniss B Jamison Yuhasz 10/23/2023

## 2023-10-23 NOTE — Progress Notes (Signed)
 Pt calm and pleasant during assessment denying SI/HI/AVH. Pt stated she was feeling much better today than when she came in. Pt compliant with medication administration per MD orders. Pt given education, support, and encouragement to be active in her treatment plan. Pt being monitored Q 15 minutes for safety per unit protocol, remains safe on the unit

## 2023-10-24 DIAGNOSIS — F202 Catatonic schizophrenia: Secondary | ICD-10-CM | POA: Diagnosis not present

## 2023-10-24 MED ORDER — LORAZEPAM 0.5 MG PO TABS: 0.5000 mg | ORAL_TABLET | Freq: Two times a day (BID) | ORAL | 0 refills | Status: AC

## 2023-10-24 MED ORDER — MIDODRINE HCL 10 MG PO TABS: 10.0000 mg | ORAL_TABLET | Freq: Three times a day (TID) | ORAL | 0 refills | Status: AC | PRN

## 2023-10-24 MED ORDER — PALIPERIDONE ER 3 MG PO TB24: 3.0000 mg | ORAL_TABLET | Freq: Every day | ORAL | 0 refills | Status: AC

## 2023-10-24 MED ORDER — METFORMIN HCL 500 MG PO TABS: 500.0000 mg | ORAL_TABLET | Freq: Every day | ORAL | 0 refills | Status: AC

## 2023-10-24 MED ORDER — TRAZODONE HCL 100 MG PO TABS: 100.0000 mg | ORAL_TABLET | Freq: Every day | ORAL | 0 refills | Status: AC

## 2023-10-24 MED ORDER — LITHIUM CARBONATE ER 300 MG PO TBCR: 600.0000 mg | EXTENDED_RELEASE_TABLET | Freq: Two times a day (BID) | ORAL | 0 refills | Status: AC

## 2023-10-24 MED ORDER — FLUOXETINE HCL 20 MG PO CAPS: 60.0000 mg | ORAL_CAPSULE | Freq: Every day | ORAL | 0 refills | Status: AC

## 2023-10-24 NOTE — Plan of Care (Signed)
   Problem: Education: Goal: Emotional status will improve Outcome: Progressing Goal: Mental status will improve Outcome: Progressing

## 2023-10-24 NOTE — Group Note (Unsigned)
 Date:  10/24/2023 Time:  9:33 AM  Group Topic/Focus:  Goals Group:   The focus of this group is to help patients establish daily goals to achieve during treatment and discuss how the patient can incorporate goal setting into their daily lives to aide in recovery.     Participation Level:  {BHH PARTICIPATION GEXBM:84132}  Participation Quality:  {BHH PARTICIPATION QUALITY:22265}  Affect:  {BHH AFFECT:22266}  Cognitive:  {BHH COGNITIVE:22267}  Insight: {BHH Insight2:20797}  Engagement in Group:  {BHH ENGAGEMENT IN GMWNU:27253}  Modes of Intervention:  {BHH MODES OF INTERVENTION:22269}  Additional Comments:  ***  Isabella [name] [name] 10/24/2023, 9:33 AM

## 2023-10-24 NOTE — Progress Notes (Signed)
 Discharge note: Suicide safety plan and survey complete. RN met with pt and reviewed pt's discharge instructions. Pt verbalized understanding of discharge instructions and pt did not have any questions. RN reviewed and provided pt with a copy of SRA, AVS and Transition Record. RN returned pt's belongings to pt. Prescriptions were called in to patient pharmacy in [place], [place]. Patient verified this was the correct location. Pt denied SI/HI/AVH and voiced no concerns. Pt was appreciative of the care pt received at [facility]. Patient discharged and provided with cab voucher by SW. Patient declined patient satisfaction survey or manager follow up post-discharge.

## 2023-10-24 NOTE — Progress Notes (Signed)
  [facility] Adult Case Management Discharge Plan :  Will you be returning to the same living situation after discharge:  Yes,  pt is returning home.  At discharge, do you have transportation home?: Yes,  CSW to assist patient in transportation home Do you have the ability to pay for your medications: Yes,  [place] MEDICAID PREPAID HEALTH PLAN / [place] MEDICAID [employer]  Release of information consent forms completed and in the chart;  Patient's signature needed at discharge.  Patient to Follow up at:  Follow-up Information     [facility] Follow up.   Why: Walk in hours are from 8AM to 5PM.  Walk in appointments are first come, first serve, please arrive early.  Appointment is scheduled for 10/25/2023 at 10AM.  Transportation has been arranged for you, please be ready by 9AM, transportation will pick you up at 12noon to take you back home. Contact information: [address] [place] [address] [phone]         [facility] [address] Follow up.   Why: Referral has been sent.  Please follow up on referral for ECT treatment. Contact information: [address] [place], [place] [address]. [phone]                Next level of care provider has access to [facility] Link:no  Safety Planning and Suicide Prevention discussed: Yes,  SPE completed with the patient's mother.      Has patient been referred to the Quitline?: Patient does not use tobacco/nicotine  products  Patient has been referred for addiction treatment: No known substance use disorder.  [name], LCSW 10/24/2023, 1:20 PM

## 2023-10-24 NOTE — Group Note (Signed)
 Recreation Therapy Group Note   Group Topic:Goal Setting  Group Date: 10/24/2023 Start Time: 1005 End Time: 1055 Facilitators: Deatrice Factor, LRT, CTRS Location: Craft Room  Group Description: Product/process development scientist. Patients were given many different magazines, a glue stick, markers, and a piece of cardstock paper. LRT and pts discussed the importance of having goals in life. LRT and pts discussed the difference between short-term and long-term goals, as well as what a SMART goal is. LRT encouraged pts to create a vision board, with images they picked and then cut out with safety scissors from the magazine, for themselves, that capture their short and long-term goals. LRT encouraged pts to show and explain their vision board to the group.   Goal Area(s) Addressed:  Patient will gain knowledge of short vs. long term goals.  Patient will identify goals for themselves. Patient will practice setting SMART goals. Patient will verbalize their goals to LRT and peers.   Affect/Mood: N/A   Participation Level: Did not attend    Clinical Observations/Individualized Feedback: Patient did not attend group.   Plan: Continue to engage patient in RT group sessions 2-3x/week.   Deatrice Factor, LRT, CTRS 10/24/2023 1:30 PM
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
## 2023-10-24 NOTE — Plan of Care (Signed)

## 2023-10-24 NOTE — BHH Suicide Risk Assessment (Addendum)
 [facility] Discharge Suicide Risk Assessment   Principal Problem: Schizoaffective disorder, bipolar type, with catatonia (HCC) Discharge Diagnoses: Principal Problem:   Schizoaffective disorder, bipolar type, with catatonia (HCC)   Total Time spent with patient: 30 minutes  Musculoskeletal: Strength & Muscle Tone: within normal limits Gait & Station: normal Patient leans: N/A  Psychiatric Specialty Exam  Presentation  General Appearance:  Casual  Eye Contact: Fair  Speech: Clear and Coherent  Speech Volume: Decreased  Handedness: Right   Mood and Affect  Mood: -- (neutral)  Duration of Depression Symptoms: Greater than two weeks  Affect: Flat   Thought Process  Thought Processes: Coherent  Descriptions of Associations:Intact  Orientation:Full (Time, Place and Person)  Thought Content:Logical  History of Schizophrenia/Schizoaffective disorder:Yes  Duration of Psychotic Symptoms:Greater than six months  Hallucinations:denies Ideas of Reference:None  Suicidal Thoughts:denies Homicidal Thoughts:denies  Sensorium  Memory: Immediate Fair  Judgment: Fair  Insight: Fair   Art therapist  Concentration: Fair  Attention Span: Fair  Recall: [employer] of Knowledge: Fair  Language: Fair   Psychomotor Activity  Psychomotor Activity:No data recorded  Assets  Assets: Housing; Desire for Improvement   Sleep  Sleep:fair  Physical Exam: Physical Exam ROS Blood pressure (!) 97/59, pulse 77, temperature 97.9 F (36.6 C), resp. rate 16, height 5\' 4"  (1.626 m), weight 88 kg, last menstrual period 09/13/2023, SpO2 97%. Body mass index is 33.3 kg/m.  Mental Status Per Nursing Assessment::   On Admission:  NA  Demographic Factors:  Low socioeconomic status  Loss Factors: Decrease in vocational status  Historical Factors: Impulsivity  Risk Reduction Factors:   Living with another person, especially a relative, Positive  social support, Positive therapeutic relationship, and Positive coping skills or problem solving skills  Continued Clinical Symptoms:  Depression:   Impulsivity  Cognitive Features That Contribute To Risk:  None    Suicide Risk:  Minimal: No identifiable suicidal ideation.  Patients presenting with no risk factors but with morbid ruminations; may be classified as minimal risk based on the severity of the depressive symptoms   Follow-up Information     [facility] Follow up.   Why: Walk in hours are from 8AM to 5PM.  Walk in appointments are first come, first serve, please arrive early.  Appointment is scheduled for 10/25/2023 at [facility] information: [address] [place] [address] [phone]                 Plan Of Care/Follow-up recommendations:  Activity:  As tolerated  Aurelia Blotter, MD 10/24/2023, 10:05 AM

## 2023-10-24 NOTE — BHH Counselor (Signed)
 CSW arranged Medicaid transportation for the patient to her appointment at [facility] 10/25/2023 at 10AM.  CSW was informed that ride will arrive at patient's home at 9:20AM, patient is to be ready at least 15 minutes before.  Trip #[id number].    CSW was able to confirm that patient will be able to arrange transportation to her appointments with ECT as it is within the 75 mile radius.  From patient's home to ECT office is 45.9 miles, per [employer].  ECT has not been scheduled at this time.  CSW is awaiting a call back from the office with appointment/consultation date and time.  CSW was able to speak with Judd Northern, staff at [facility] - [address] [phone].  CSW confirmed fax number, [phone], however was informed that the staff that uploads referrals into the system has been out for the past couple of days and unclear if referral has been scanned in.  Mia reports that they have attempted to contact patient and this Clinical research associate for scheduling purposes.  CSW reviewed contact information and corrected the contact information for this writer, [facility] had the wrong number.  Mia declined to schedule with this Clinical research associate at this time stating that she wanted to confirm that providers have reviewed and will call this Clinical research associate back.   CSW still awaiting call at this time.   Shasta Deist, MSW, LCSW 10/24/2023 11:20 AM

## 2023-10-24 NOTE — Group Note (Signed)
 Date:  10/24/2023 Time:  11:12 AM  Group Topic/Focus:  Wellness Toolbox:   The focus of this group is to discuss various aspects of wellness, balancing those aspects and exploring ways to increase the ability to experience wellness.  Patients will create a wellness toolbox for use upon discharge.    Participation Level:  Did Not Attend  Participation Quality:    Affect:    Cognitive:    Insight:   Engagement in Group:    Modes of Intervention:    Additional Comments:    Kaziah Krizek 10/24/2023, 11:12 AM

## 2023-10-24 NOTE — Group Note (Signed)
 LCSW Group Therapy Note   Group Date: 10/24/2023 Start Time: 1300 End Time: 1400   Type of Therapy and Topic:  Group Therapy: Challenging Core Beliefs  Participation Level:  Did Not Attend  Description of Group:  Patients were educated about core beliefs and asked to identify one harmful core belief that they have. Patients were asked to explore from where those beliefs originate. Patients were asked to discuss how those beliefs make them feel and the resulting behaviors of those beliefs. They were then be asked if those beliefs are true and, if so, what evidence they have to support them. Lastly, group members were challenged to replace those negative core beliefs with helpful beliefs.   Therapeutic Goals:   1. Patient will identify harmful core beliefs and explore the origins of such beliefs. 2. Patient will identify feelings and behaviors that result from those core beliefs. 3. Patient will discuss whether such beliefs are true. 4.  Patient will replace harmful core beliefs with helpful ones.  Summary of Patient Progress:  Patient did not attend.   Therapeutic Modalities: Cognitive Behavioral Therapy; Solution-Focused Therapy   Hendy Brindle M Kieley Akter, LCSWA 10/24/2023  1:48 PM

## 2023-10-25 NOTE — BHH Counselor (Addendum)
 ADDENDUM CSW contacted the family using interpreter Jodene Munro [phone].  CSW explained the information below.  Mother expressed concern that she would need to miss her dialysis appointment to take patient to appointment. CSW explained that patient is able to go to appointment alone and transportation is for one.  Family would need to contact transportation to have the ride scheduled for her to accompany pt if desired.  CSW explained that pt will need to bring ID, insurance card and copay to appointment.  Mother expressed understanding.  Shasta Deist, MSW, LCSW 10/26/2023 9:00 AM    CSW notes that this note is being entered after patient has been discharged.   CSW called using interpreter services, Coye Diver [phone], to inform patient that she left her belongings at the hospital.  CSW also wanted to inform patient that she has been approved for a ECT consultation with [facility] and appointment is 10/31/2023 at 1:00PM.  CSW has arranged for transportation for patient.  Ride will arrive between 11:30 and 12PM.  Reference number for ride is [id number].    CSW and interpreter left HIPAA compliant voicemail requesting return call.   Shasta Deist, MSW, LCSW 10/25/2023 11:32 AM

## 2023-11-15 DIAGNOSIS — R42 Dizziness and giddiness: Secondary | ICD-10-CM | POA: Diagnosis not present
